# Patient Record
Sex: Male | Born: 1935
Health system: Southern US, Community
[De-identification: ages and names within clinical notes are randomized; demographics above are authoritative.]

## PROBLEM LIST (undated history)

## (undated) DIAGNOSIS — N183 Chronic kidney disease, stage 3 unspecified: Secondary | ICD-10-CM

## (undated) DIAGNOSIS — J449 Chronic obstructive pulmonary disease, unspecified: Secondary | ICD-10-CM

## (undated) DIAGNOSIS — G4733 Obstructive sleep apnea (adult) (pediatric): Secondary | ICD-10-CM

## (undated) DIAGNOSIS — I10 Essential (primary) hypertension: Secondary | ICD-10-CM

## (undated) DIAGNOSIS — J189 Pneumonia, unspecified organism: Secondary | ICD-10-CM

## (undated) DIAGNOSIS — E785 Hyperlipidemia, unspecified: Secondary | ICD-10-CM

## (undated) DIAGNOSIS — I4891 Unspecified atrial fibrillation: Secondary | ICD-10-CM

## (undated) DIAGNOSIS — I4892 Unspecified atrial flutter: Secondary | ICD-10-CM

## (undated) DIAGNOSIS — G9340 Encephalopathy, unspecified: Secondary | ICD-10-CM

## (undated) DIAGNOSIS — A0472 Enterocolitis due to Clostridium difficile, not specified as recurrent: Secondary | ICD-10-CM

## (undated) DIAGNOSIS — A419 Sepsis, unspecified organism: Secondary | ICD-10-CM

## (undated) DIAGNOSIS — R6521 Severe sepsis with septic shock: Secondary | ICD-10-CM

## (undated) HISTORY — DX: Sepsis, unspecified organism: A41.9

## (undated) HISTORY — DX: Hyperlipidemia, unspecified: E78.5

## (undated) HISTORY — DX: Chronic kidney disease, stage 3 (moderate): N18.3

## (undated) HISTORY — DX: Unspecified atrial flutter: I48.92

## (undated) HISTORY — DX: Enterocolitis due to Clostridium difficile, not specified as recurrent: A04.72

## (undated) HISTORY — DX: Encephalopathy, unspecified: G93.40

## (undated) HISTORY — DX: Severe sepsis with septic shock: R65.21

## (undated) HISTORY — DX: Pneumonia, unspecified organism: J18.9

## (undated) HISTORY — DX: Unspecified atrial fibrillation: I48.91

## (undated) HISTORY — PX: EYE SURGERY: SHX253

## (undated) HISTORY — DX: Chronic obstructive pulmonary disease, unspecified: J44.9

## (undated) HISTORY — PX: CATARACT EXTRACTION: SUR2

## (undated) HISTORY — DX: Chronic kidney disease, stage 3 unspecified: N18.30

---

## 1997-10-23 HISTORY — PX: CORONARY ARTERY BYPASS GRAFT: SHX141

## 1997-11-01 ENCOUNTER — Inpatient Hospital Stay (HOSPITAL_COMMUNITY)
Admission: AD | Admit: 1997-11-01 | Discharge: 1997-11-06 | Payer: Self-pay | Admitting: Thoracic Surgery (Cardiothoracic Vascular Surgery)

## 1998-06-06 ENCOUNTER — Observation Stay (HOSPITAL_COMMUNITY): Admission: RE | Admit: 1998-06-06 | Discharge: 1998-06-06 | Payer: Self-pay | Admitting: Neurosurgery

## 1998-06-06 ENCOUNTER — Encounter: Payer: Self-pay | Admitting: Neurosurgery

## 1998-07-26 ENCOUNTER — Encounter: Payer: Self-pay | Admitting: Neurosurgery

## 1998-07-26 ENCOUNTER — Ambulatory Visit (HOSPITAL_COMMUNITY): Admission: RE | Admit: 1998-07-26 | Discharge: 1998-07-26 | Payer: Self-pay | Admitting: Neurosurgery

## 2002-02-22 HISTORY — PX: OTHER SURGICAL HISTORY: SHX169

## 2007-05-26 HISTORY — PX: OTHER SURGICAL HISTORY: SHX169

## 2010-05-20 ENCOUNTER — Ambulatory Visit: Payer: Self-pay | Admitting: Internal Medicine

## 2011-05-26 DIAGNOSIS — I4891 Unspecified atrial fibrillation: Secondary | ICD-10-CM

## 2011-05-26 HISTORY — DX: Unspecified atrial fibrillation: I48.91

## 2011-06-15 DIAGNOSIS — I2510 Atherosclerotic heart disease of native coronary artery without angina pectoris: Secondary | ICD-10-CM | POA: Diagnosis not present

## 2011-06-15 DIAGNOSIS — I4891 Unspecified atrial fibrillation: Secondary | ICD-10-CM | POA: Diagnosis not present

## 2011-06-15 DIAGNOSIS — I119 Hypertensive heart disease without heart failure: Secondary | ICD-10-CM | POA: Diagnosis not present

## 2011-06-15 DIAGNOSIS — E782 Mixed hyperlipidemia: Secondary | ICD-10-CM | POA: Diagnosis not present

## 2011-08-04 DIAGNOSIS — E785 Hyperlipidemia, unspecified: Secondary | ICD-10-CM | POA: Diagnosis not present

## 2011-08-04 DIAGNOSIS — J40 Bronchitis, not specified as acute or chronic: Secondary | ICD-10-CM | POA: Diagnosis not present

## 2011-08-04 DIAGNOSIS — Z23 Encounter for immunization: Secondary | ICD-10-CM | POA: Diagnosis not present

## 2011-08-11 DIAGNOSIS — R5381 Other malaise: Secondary | ICD-10-CM | POA: Diagnosis not present

## 2011-08-11 DIAGNOSIS — E785 Hyperlipidemia, unspecified: Secondary | ICD-10-CM | POA: Diagnosis not present

## 2011-08-11 DIAGNOSIS — Z23 Encounter for immunization: Secondary | ICD-10-CM | POA: Diagnosis not present

## 2011-08-11 DIAGNOSIS — I10 Essential (primary) hypertension: Secondary | ICD-10-CM | POA: Diagnosis not present

## 2011-08-11 DIAGNOSIS — R42 Dizziness and giddiness: Secondary | ICD-10-CM | POA: Diagnosis not present

## 2011-08-18 ENCOUNTER — Ambulatory Visit: Payer: Self-pay | Admitting: Family Medicine

## 2011-08-18 DIAGNOSIS — R413 Other amnesia: Secondary | ICD-10-CM | POA: Diagnosis not present

## 2011-08-18 DIAGNOSIS — E0590 Thyrotoxicosis, unspecified without thyrotoxic crisis or storm: Secondary | ICD-10-CM | POA: Diagnosis not present

## 2011-08-24 DIAGNOSIS — I2510 Atherosclerotic heart disease of native coronary artery without angina pectoris: Secondary | ICD-10-CM | POA: Diagnosis not present

## 2011-08-24 DIAGNOSIS — R42 Dizziness and giddiness: Secondary | ICD-10-CM | POA: Diagnosis not present

## 2011-08-24 DIAGNOSIS — E782 Mixed hyperlipidemia: Secondary | ICD-10-CM | POA: Diagnosis not present

## 2011-08-24 DIAGNOSIS — I4891 Unspecified atrial fibrillation: Secondary | ICD-10-CM | POA: Diagnosis not present

## 2011-08-30 DIAGNOSIS — R4182 Altered mental status, unspecified: Secondary | ICD-10-CM | POA: Diagnosis not present

## 2011-08-30 DIAGNOSIS — J189 Pneumonia, unspecified organism: Secondary | ICD-10-CM | POA: Diagnosis not present

## 2011-08-30 DIAGNOSIS — R404 Transient alteration of awareness: Secondary | ICD-10-CM | POA: Diagnosis not present

## 2011-08-30 DIAGNOSIS — R509 Fever, unspecified: Secondary | ICD-10-CM | POA: Diagnosis not present

## 2011-08-30 DIAGNOSIS — R918 Other nonspecific abnormal finding of lung field: Secondary | ICD-10-CM | POA: Diagnosis not present

## 2011-08-30 DIAGNOSIS — N179 Acute kidney failure, unspecified: Secondary | ICD-10-CM | POA: Diagnosis not present

## 2011-08-30 DIAGNOSIS — R6889 Other general symptoms and signs: Secondary | ICD-10-CM | POA: Diagnosis not present

## 2011-08-30 LAB — CBC
HCT: 36.8 % — ABNORMAL LOW (ref 40.0–52.0)
HGB: 12.3 g/dL — ABNORMAL LOW (ref 13.0–18.0)
MCH: 30.7 pg (ref 26.0–34.0)
MCHC: 33.5 g/dL (ref 32.0–36.0)
MCV: 92 fL (ref 80–100)
Platelet: 171 10*3/uL (ref 150–440)
RBC: 4.02 10*6/uL — ABNORMAL LOW (ref 4.40–5.90)
RDW: 15.9 % — ABNORMAL HIGH (ref 11.5–14.5)
WBC: 9.3 10*3/uL (ref 3.8–10.6)

## 2011-08-30 LAB — COMPREHENSIVE METABOLIC PANEL
Albumin: 3.6 g/dL (ref 3.4–5.0)
Alkaline Phosphatase: 75 U/L (ref 50–136)
Anion Gap: 9 (ref 7–16)
BUN: 26 mg/dL — ABNORMAL HIGH (ref 7–18)
Bilirubin,Total: 0.4 mg/dL (ref 0.2–1.0)
Calcium, Total: 9.1 mg/dL (ref 8.5–10.1)
Chloride: 106 mmol/L (ref 98–107)
Co2: 26 mmol/L (ref 21–32)
Creatinine: 2.08 mg/dL — ABNORMAL HIGH (ref 0.60–1.30)
EGFR (African American): 40 — ABNORMAL LOW
EGFR (Non-African Amer.): 33 — ABNORMAL LOW
Glucose: 125 mg/dL — ABNORMAL HIGH (ref 65–99)
Osmolality: 287 (ref 275–301)
Potassium: 4.4 mmol/L (ref 3.5–5.1)
SGOT(AST): 37 U/L (ref 15–37)
SGPT (ALT): 32 U/L
Sodium: 141 mmol/L (ref 136–145)
Total Protein: 7.7 g/dL (ref 6.4–8.2)

## 2011-08-30 LAB — DRUG SCREEN, URINE
Amphetamines, Ur Screen: NEGATIVE (ref ?–1000)
Barbiturates, Ur Screen: NEGATIVE (ref ?–200)
Benzodiazepine, Ur Scrn: POSITIVE (ref ?–200)
Cannabinoid 50 Ng, Ur ~~LOC~~: NEGATIVE (ref ?–50)
Cocaine Metabolite,Ur ~~LOC~~: NEGATIVE (ref ?–300)
MDMA (Ecstasy)Ur Screen: NEGATIVE (ref ?–500)
Methadone, Ur Screen: NEGATIVE (ref ?–300)
Opiate, Ur Screen: POSITIVE (ref ?–300)
Phencyclidine (PCP) Ur S: NEGATIVE (ref ?–25)
Tricyclic, Ur Screen: NEGATIVE (ref ?–1000)

## 2011-08-30 LAB — TROPONIN I: Troponin-I: <0.02 ng/mL

## 2011-08-30 LAB — PROTIME-INR
INR: 3.9
Prothrombin Time: 38 secs — ABNORMAL HIGH (ref 11.5–14.7)

## 2011-08-31 ENCOUNTER — Inpatient Hospital Stay: Payer: Self-pay | Admitting: Internal Medicine

## 2011-08-31 DIAGNOSIS — E785 Hyperlipidemia, unspecified: Secondary | ICD-10-CM | POA: Diagnosis present

## 2011-08-31 DIAGNOSIS — J189 Pneumonia, unspecified organism: Secondary | ICD-10-CM | POA: Diagnosis present

## 2011-08-31 DIAGNOSIS — R55 Syncope and collapse: Secondary | ICD-10-CM | POA: Diagnosis not present

## 2011-08-31 DIAGNOSIS — N179 Acute kidney failure, unspecified: Secondary | ICD-10-CM | POA: Diagnosis not present

## 2011-08-31 DIAGNOSIS — Z91013 Allergy to seafood: Secondary | ICD-10-CM | POA: Diagnosis not present

## 2011-08-31 DIAGNOSIS — E860 Dehydration: Secondary | ICD-10-CM | POA: Diagnosis not present

## 2011-08-31 DIAGNOSIS — I495 Sick sinus syndrome: Secondary | ICD-10-CM | POA: Diagnosis present

## 2011-08-31 DIAGNOSIS — I2510 Atherosclerotic heart disease of native coronary artery without angina pectoris: Secondary | ICD-10-CM | POA: Diagnosis not present

## 2011-08-31 DIAGNOSIS — Z7901 Long term (current) use of anticoagulants: Secondary | ICD-10-CM | POA: Diagnosis not present

## 2011-08-31 DIAGNOSIS — I6529 Occlusion and stenosis of unspecified carotid artery: Secondary | ICD-10-CM | POA: Diagnosis not present

## 2011-08-31 DIAGNOSIS — I10 Essential (primary) hypertension: Secondary | ICD-10-CM | POA: Diagnosis not present

## 2011-08-31 DIAGNOSIS — R5381 Other malaise: Secondary | ICD-10-CM | POA: Diagnosis not present

## 2011-08-31 DIAGNOSIS — G2581 Restless legs syndrome: Secondary | ICD-10-CM | POA: Diagnosis present

## 2011-08-31 DIAGNOSIS — R42 Dizziness and giddiness: Secondary | ICD-10-CM | POA: Diagnosis not present

## 2011-08-31 DIAGNOSIS — I658 Occlusion and stenosis of other precerebral arteries: Secondary | ICD-10-CM | POA: Diagnosis not present

## 2011-08-31 DIAGNOSIS — Z87891 Personal history of nicotine dependence: Secondary | ICD-10-CM | POA: Diagnosis not present

## 2011-08-31 DIAGNOSIS — Z951 Presence of aortocoronary bypass graft: Secondary | ICD-10-CM | POA: Diagnosis not present

## 2011-08-31 DIAGNOSIS — Z7982 Long term (current) use of aspirin: Secondary | ICD-10-CM | POA: Diagnosis not present

## 2011-08-31 DIAGNOSIS — I4891 Unspecified atrial fibrillation: Secondary | ICD-10-CM | POA: Diagnosis not present

## 2011-08-31 DIAGNOSIS — Z7902 Long term (current) use of antithrombotics/antiplatelets: Secondary | ICD-10-CM | POA: Diagnosis not present

## 2011-08-31 LAB — BASIC METABOLIC PANEL
Anion Gap: 11 (ref 7–16)
BUN: 27 mg/dL — ABNORMAL HIGH (ref 7–18)
Calcium, Total: 8.5 mg/dL (ref 8.5–10.1)
Chloride: 105 mmol/L (ref 98–107)
Co2: 24 mmol/L (ref 21–32)
Creatinine: 1.85 mg/dL — ABNORMAL HIGH (ref 0.60–1.30)
EGFR (African American): 46 — ABNORMAL LOW
EGFR (Non-African Amer.): 38 — ABNORMAL LOW
Glucose: 117 mg/dL — ABNORMAL HIGH (ref 65–99)
Osmolality: 286 (ref 275–301)
Potassium: 4.6 mmol/L (ref 3.5–5.1)
Sodium: 140 mmol/L (ref 136–145)

## 2011-08-31 LAB — URINALYSIS, COMPLETE
Bacteria: NONE SEEN
Bilirubin,UR: NEGATIVE
Glucose,UR: NEGATIVE mg/dL (ref 0–75)
Hyaline Cast: 3
Ketone: NEGATIVE
Nitrite: NEGATIVE
Ph: 5 (ref 4.5–8.0)
Protein: NEGATIVE
RBC,UR: 17 /HPF (ref 0–5)
Specific Gravity: 1.018 (ref 1.003–1.030)
Squamous Epithelial: NONE SEEN
WBC UR: 2 /HPF (ref 0–5)

## 2011-08-31 LAB — CREATININE, URINE, RANDOM: Creatinine, Urine Random: 173.5 mg/dL — ABNORMAL HIGH (ref 30.0–125.0)

## 2011-08-31 LAB — SODIUM, URINE, RANDOM: Sodium, Urine Random: 98 mmol/L (ref 20–110)

## 2011-08-31 LAB — DIGOXIN LEVEL: Digoxin: 0.65 ng/mL

## 2011-09-01 DIAGNOSIS — E860 Dehydration: Secondary | ICD-10-CM | POA: Diagnosis not present

## 2011-09-01 DIAGNOSIS — I10 Essential (primary) hypertension: Secondary | ICD-10-CM | POA: Diagnosis not present

## 2011-09-01 DIAGNOSIS — N179 Acute kidney failure, unspecified: Secondary | ICD-10-CM | POA: Diagnosis not present

## 2011-09-01 DIAGNOSIS — I4891 Unspecified atrial fibrillation: Secondary | ICD-10-CM | POA: Diagnosis not present

## 2011-09-01 DIAGNOSIS — J189 Pneumonia, unspecified organism: Secondary | ICD-10-CM | POA: Diagnosis not present

## 2011-09-01 DIAGNOSIS — R55 Syncope and collapse: Secondary | ICD-10-CM | POA: Diagnosis not present

## 2011-09-01 LAB — PROTIME-INR
INR: 2.7
Prothrombin Time: 28.8 secs — ABNORMAL HIGH (ref 11.5–14.7)

## 2011-09-01 LAB — TSH: Thyroid Stimulating Horm: 5.43 u[IU]/mL — ABNORMAL HIGH

## 2011-09-01 LAB — MAGNESIUM: Magnesium: 1.6 mg/dL — ABNORMAL LOW

## 2011-09-01 LAB — CBC WITH DIFFERENTIAL/PLATELET
Basophil #: 0 10*3/uL (ref 0.0–0.1)
Basophil %: 0.6 %
Eosinophil #: 0.2 10*3/uL (ref 0.0–0.7)
Eosinophil %: 4 %
HCT: 35.1 % — ABNORMAL LOW (ref 40.0–52.0)
HGB: 11.5 g/dL — ABNORMAL LOW (ref 13.0–18.0)
Lymphocyte #: 1.8 10*3/uL (ref 1.0–3.6)
Lymphocyte %: 31.3 %
MCH: 30.7 pg (ref 26.0–34.0)
MCHC: 32.8 g/dL (ref 32.0–36.0)
MCV: 94 fL (ref 80–100)
Monocyte #: 0.5 10*3/uL (ref 0.0–0.7)
Monocyte %: 8.5 %
Neutrophil #: 3.2 10*3/uL (ref 1.4–6.5)
Neutrophil %: 55.6 %
Platelet: 126 10*3/uL — ABNORMAL LOW (ref 150–440)
RBC: 3.76 10*6/uL — ABNORMAL LOW (ref 4.40–5.90)
RDW: 16.3 % — ABNORMAL HIGH (ref 11.5–14.5)
WBC: 5.7 10*3/uL (ref 3.8–10.6)

## 2011-09-01 LAB — LIPID PANEL
Cholesterol: 104 mg/dL (ref 0–200)
HDL Cholesterol: 42 mg/dL (ref 40–60)
Ldl Cholesterol, Calc: 44 mg/dL (ref 0–100)
Triglycerides: 88 mg/dL (ref 0–200)
VLDL Cholesterol, Calc: 18 mg/dL (ref 5–40)

## 2011-09-01 LAB — BASIC METABOLIC PANEL
Anion Gap: 8 (ref 7–16)
BUN: 18 mg/dL (ref 7–18)
Calcium, Total: 8.6 mg/dL (ref 8.5–10.1)
Chloride: 109 mmol/L — ABNORMAL HIGH (ref 98–107)
Co2: 24 mmol/L (ref 21–32)
Creatinine: 1.17 mg/dL (ref 0.60–1.30)
EGFR (African American): >60
EGFR (Non-African Amer.): >60
Glucose: 81 mg/dL (ref 65–99)
Osmolality: 282 (ref 275–301)
Potassium: 5 mmol/L (ref 3.5–5.1)
Sodium: 141 mmol/L (ref 136–145)

## 2011-09-01 LAB — FOLATE: Folic Acid: 11.5 ng/mL (ref 3.1–100.0)

## 2011-09-02 LAB — HEMOGLOBIN A1C

## 2011-09-03 DIAGNOSIS — I2510 Atherosclerotic heart disease of native coronary artery without angina pectoris: Secondary | ICD-10-CM | POA: Diagnosis not present

## 2011-09-03 DIAGNOSIS — I4891 Unspecified atrial fibrillation: Secondary | ICD-10-CM | POA: Diagnosis not present

## 2011-09-03 DIAGNOSIS — I10 Essential (primary) hypertension: Secondary | ICD-10-CM | POA: Diagnosis not present

## 2011-09-03 DIAGNOSIS — E785 Hyperlipidemia, unspecified: Secondary | ICD-10-CM | POA: Diagnosis not present

## 2011-09-07 LAB — CULTURE, BLOOD (SINGLE)

## 2011-09-15 ENCOUNTER — Ambulatory Visit: Payer: Self-pay | Admitting: Family Medicine

## 2011-09-15 DIAGNOSIS — J189 Pneumonia, unspecified organism: Secondary | ICD-10-CM | POA: Diagnosis not present

## 2011-09-15 DIAGNOSIS — R9389 Abnormal findings on diagnostic imaging of other specified body structures: Secondary | ICD-10-CM | POA: Diagnosis not present

## 2011-09-15 DIAGNOSIS — J449 Chronic obstructive pulmonary disease, unspecified: Secondary | ICD-10-CM | POA: Diagnosis not present

## 2011-09-15 DIAGNOSIS — Z951 Presence of aortocoronary bypass graft: Secondary | ICD-10-CM | POA: Diagnosis not present

## 2011-09-17 DIAGNOSIS — I4891 Unspecified atrial fibrillation: Secondary | ICD-10-CM | POA: Diagnosis not present

## 2011-09-18 DIAGNOSIS — G8929 Other chronic pain: Secondary | ICD-10-CM | POA: Diagnosis not present

## 2011-09-18 DIAGNOSIS — J189 Pneumonia, unspecified organism: Secondary | ICD-10-CM | POA: Diagnosis not present

## 2011-09-18 DIAGNOSIS — Z23 Encounter for immunization: Secondary | ICD-10-CM | POA: Diagnosis not present

## 2011-09-18 DIAGNOSIS — E785 Hyperlipidemia, unspecified: Secondary | ICD-10-CM | POA: Diagnosis not present

## 2011-09-23 DIAGNOSIS — J209 Acute bronchitis, unspecified: Secondary | ICD-10-CM | POA: Diagnosis not present

## 2011-09-23 DIAGNOSIS — Z23 Encounter for immunization: Secondary | ICD-10-CM | POA: Diagnosis not present

## 2011-09-23 DIAGNOSIS — J189 Pneumonia, unspecified organism: Secondary | ICD-10-CM | POA: Diagnosis not present

## 2011-10-05 DIAGNOSIS — R05 Cough: Secondary | ICD-10-CM | POA: Diagnosis not present

## 2011-10-05 DIAGNOSIS — J189 Pneumonia, unspecified organism: Secondary | ICD-10-CM | POA: Diagnosis not present

## 2011-10-05 DIAGNOSIS — R059 Cough, unspecified: Secondary | ICD-10-CM | POA: Diagnosis not present

## 2011-10-05 DIAGNOSIS — Z23 Encounter for immunization: Secondary | ICD-10-CM | POA: Diagnosis not present

## 2011-10-09 DIAGNOSIS — Z23 Encounter for immunization: Secondary | ICD-10-CM | POA: Diagnosis not present

## 2011-10-09 DIAGNOSIS — J189 Pneumonia, unspecified organism: Secondary | ICD-10-CM | POA: Diagnosis not present

## 2011-10-09 DIAGNOSIS — R040 Epistaxis: Secondary | ICD-10-CM | POA: Diagnosis not present

## 2011-10-10 ENCOUNTER — Emergency Department: Payer: Self-pay | Admitting: Emergency Medicine

## 2011-10-10 DIAGNOSIS — I4891 Unspecified atrial fibrillation: Secondary | ICD-10-CM | POA: Diagnosis not present

## 2011-10-10 DIAGNOSIS — Z79899 Other long term (current) drug therapy: Secondary | ICD-10-CM | POA: Diagnosis not present

## 2011-10-10 DIAGNOSIS — Z951 Presence of aortocoronary bypass graft: Secondary | ICD-10-CM | POA: Diagnosis not present

## 2011-10-10 DIAGNOSIS — Z7901 Long term (current) use of anticoagulants: Secondary | ICD-10-CM | POA: Diagnosis not present

## 2011-10-10 DIAGNOSIS — I2510 Atherosclerotic heart disease of native coronary artery without angina pectoris: Secondary | ICD-10-CM | POA: Diagnosis not present

## 2011-10-10 DIAGNOSIS — R040 Epistaxis: Secondary | ICD-10-CM | POA: Diagnosis not present

## 2011-10-10 LAB — CBC WITH DIFFERENTIAL/PLATELET
Basophil #: 0 10*3/uL (ref 0.0–0.1)
Basophil %: 0.7 %
Eosinophil #: 0.2 10*3/uL (ref 0.0–0.7)
Eosinophil %: 3.1 %
HCT: 35.3 % — ABNORMAL LOW (ref 40.0–52.0)
HGB: 11.6 g/dL — ABNORMAL LOW (ref 13.0–18.0)
Lymphocyte #: 1.6 10*3/uL (ref 1.0–3.6)
Lymphocyte %: 28.1 %
MCH: 30.8 pg (ref 26.0–34.0)
MCHC: 32.9 g/dL (ref 32.0–36.0)
MCV: 94 fL (ref 80–100)
Monocyte #: 0.6 x10 3/mm (ref 0.2–1.0)
Monocyte %: 9.9 %
Neutrophil #: 3.4 10*3/uL (ref 1.4–6.5)
Neutrophil %: 58.2 %
Platelet: 172 10*3/uL (ref 150–440)
RBC: 3.76 10*6/uL — ABNORMAL LOW (ref 4.40–5.90)
RDW: 16.7 % — ABNORMAL HIGH (ref 11.5–14.5)
WBC: 5.9 10*3/uL (ref 3.8–10.6)

## 2011-10-10 LAB — APTT: Activated PTT: 34.9 secs (ref 23.6–35.9)

## 2011-10-10 LAB — PROTIME-INR
INR: 1.9
Prothrombin Time: 21.9 secs — ABNORMAL HIGH (ref 11.5–14.7)

## 2011-10-11 ENCOUNTER — Emergency Department: Payer: Self-pay | Admitting: Emergency Medicine

## 2011-10-11 DIAGNOSIS — I4891 Unspecified atrial fibrillation: Secondary | ICD-10-CM | POA: Diagnosis not present

## 2011-10-11 DIAGNOSIS — Z951 Presence of aortocoronary bypass graft: Secondary | ICD-10-CM | POA: Diagnosis not present

## 2011-10-11 DIAGNOSIS — I2510 Atherosclerotic heart disease of native coronary artery without angina pectoris: Secondary | ICD-10-CM | POA: Diagnosis not present

## 2011-10-11 DIAGNOSIS — R040 Epistaxis: Secondary | ICD-10-CM | POA: Diagnosis not present

## 2011-10-12 DIAGNOSIS — R040 Epistaxis: Secondary | ICD-10-CM | POA: Diagnosis not present

## 2011-10-14 DIAGNOSIS — R040 Epistaxis: Secondary | ICD-10-CM | POA: Diagnosis not present

## 2011-10-15 DIAGNOSIS — R040 Epistaxis: Secondary | ICD-10-CM | POA: Diagnosis not present

## 2011-10-26 ENCOUNTER — Ambulatory Visit: Payer: Self-pay | Admitting: Family Medicine

## 2011-10-26 DIAGNOSIS — R05 Cough: Secondary | ICD-10-CM | POA: Diagnosis not present

## 2011-10-26 DIAGNOSIS — R059 Cough, unspecified: Secondary | ICD-10-CM | POA: Diagnosis not present

## 2011-10-26 DIAGNOSIS — J449 Chronic obstructive pulmonary disease, unspecified: Secondary | ICD-10-CM | POA: Diagnosis not present

## 2011-10-28 DIAGNOSIS — R059 Cough, unspecified: Secondary | ICD-10-CM | POA: Diagnosis not present

## 2011-10-28 DIAGNOSIS — R05 Cough: Secondary | ICD-10-CM | POA: Diagnosis not present

## 2011-10-28 DIAGNOSIS — Z23 Encounter for immunization: Secondary | ICD-10-CM | POA: Diagnosis not present

## 2011-10-28 DIAGNOSIS — J189 Pneumonia, unspecified organism: Secondary | ICD-10-CM | POA: Diagnosis not present

## 2011-12-27 ENCOUNTER — Emergency Department: Payer: Self-pay | Admitting: Emergency Medicine

## 2011-12-27 DIAGNOSIS — I10 Essential (primary) hypertension: Secondary | ICD-10-CM | POA: Diagnosis not present

## 2011-12-27 DIAGNOSIS — Z951 Presence of aortocoronary bypass graft: Secondary | ICD-10-CM | POA: Diagnosis not present

## 2011-12-27 DIAGNOSIS — Z7901 Long term (current) use of anticoagulants: Secondary | ICD-10-CM | POA: Diagnosis not present

## 2011-12-27 DIAGNOSIS — R040 Epistaxis: Secondary | ICD-10-CM | POA: Diagnosis not present

## 2011-12-27 LAB — CBC
HCT: 34.9 % — ABNORMAL LOW (ref 40.0–52.0)
HGB: 11.7 g/dL — ABNORMAL LOW (ref 13.0–18.0)
MCH: 31.1 pg (ref 26.0–34.0)
MCHC: 33.4 g/dL (ref 32.0–36.0)
MCV: 93 fL (ref 80–100)
Platelet: 134 10*3/uL — ABNORMAL LOW (ref 150–440)
RBC: 3.75 10*6/uL — ABNORMAL LOW (ref 4.40–5.90)
RDW: 15.9 % — ABNORMAL HIGH (ref 11.5–14.5)
WBC: 7 10*3/uL (ref 3.8–10.6)

## 2011-12-27 LAB — PROTIME-INR
INR: 2.5
Prothrombin Time: 27.4 secs — ABNORMAL HIGH (ref 11.5–14.7)

## 2011-12-29 ENCOUNTER — Emergency Department: Payer: Self-pay | Admitting: *Deleted

## 2011-12-29 DIAGNOSIS — I10 Essential (primary) hypertension: Secondary | ICD-10-CM | POA: Diagnosis not present

## 2011-12-29 DIAGNOSIS — Z79899 Other long term (current) drug therapy: Secondary | ICD-10-CM | POA: Diagnosis not present

## 2011-12-29 DIAGNOSIS — I252 Old myocardial infarction: Secondary | ICD-10-CM | POA: Diagnosis not present

## 2011-12-29 DIAGNOSIS — Z951 Presence of aortocoronary bypass graft: Secondary | ICD-10-CM | POA: Diagnosis not present

## 2011-12-29 DIAGNOSIS — R040 Epistaxis: Secondary | ICD-10-CM | POA: Diagnosis not present

## 2011-12-29 DIAGNOSIS — Z7901 Long term (current) use of anticoagulants: Secondary | ICD-10-CM | POA: Diagnosis not present

## 2011-12-30 ENCOUNTER — Emergency Department: Payer: Self-pay | Admitting: Emergency Medicine

## 2011-12-30 DIAGNOSIS — K2990 Gastroduodenitis, unspecified, without bleeding: Secondary | ICD-10-CM | POA: Diagnosis not present

## 2011-12-30 DIAGNOSIS — R040 Epistaxis: Secondary | ICD-10-CM | POA: Diagnosis not present

## 2011-12-30 DIAGNOSIS — R0609 Other forms of dyspnea: Secondary | ICD-10-CM | POA: Diagnosis not present

## 2011-12-30 DIAGNOSIS — Z23 Encounter for immunization: Secondary | ICD-10-CM | POA: Diagnosis not present

## 2011-12-30 DIAGNOSIS — K2970 Gastritis, unspecified, without bleeding: Secondary | ICD-10-CM | POA: Diagnosis not present

## 2011-12-30 DIAGNOSIS — I10 Essential (primary) hypertension: Secondary | ICD-10-CM | POA: Diagnosis not present

## 2011-12-30 DIAGNOSIS — R0989 Other specified symptoms and signs involving the circulatory and respiratory systems: Secondary | ICD-10-CM | POA: Diagnosis not present

## 2011-12-30 LAB — CBC
HCT: 33.1 % — ABNORMAL LOW (ref 40.0–52.0)
HGB: 11.4 g/dL — ABNORMAL LOW (ref 13.0–18.0)
MCH: 31.9 pg (ref 26.0–34.0)
MCHC: 34.5 g/dL (ref 32.0–36.0)
MCV: 93 fL (ref 80–100)
Platelet: 137 10*3/uL — ABNORMAL LOW (ref 150–440)
RBC: 3.58 10*6/uL — ABNORMAL LOW (ref 4.40–5.90)
RDW: 15.9 % — ABNORMAL HIGH (ref 11.5–14.5)
WBC: 8.8 10*3/uL (ref 3.8–10.6)

## 2011-12-30 LAB — APTT: Activated PTT: 37.6 secs — ABNORMAL HIGH (ref 23.6–35.9)

## 2011-12-30 LAB — PROTIME-INR
INR: 1.8
Prothrombin Time: 21.4 secs — ABNORMAL HIGH (ref 11.5–14.7)

## 2012-01-04 DIAGNOSIS — R040 Epistaxis: Secondary | ICD-10-CM | POA: Diagnosis not present

## 2012-01-20 DIAGNOSIS — I10 Essential (primary) hypertension: Secondary | ICD-10-CM | POA: Diagnosis not present

## 2012-01-20 DIAGNOSIS — M5020 Other cervical disc displacement, unspecified cervical region: Secondary | ICD-10-CM | POA: Diagnosis not present

## 2012-01-20 DIAGNOSIS — G2581 Restless legs syndrome: Secondary | ICD-10-CM | POA: Diagnosis not present

## 2012-01-20 DIAGNOSIS — J449 Chronic obstructive pulmonary disease, unspecified: Secondary | ICD-10-CM | POA: Diagnosis not present

## 2012-01-28 DIAGNOSIS — R609 Edema, unspecified: Secondary | ICD-10-CM | POA: Diagnosis not present

## 2012-01-28 DIAGNOSIS — M5020 Other cervical disc displacement, unspecified cervical region: Secondary | ICD-10-CM | POA: Diagnosis not present

## 2012-01-28 DIAGNOSIS — I10 Essential (primary) hypertension: Secondary | ICD-10-CM | POA: Diagnosis not present

## 2012-01-28 DIAGNOSIS — J449 Chronic obstructive pulmonary disease, unspecified: Secondary | ICD-10-CM | POA: Diagnosis not present

## 2012-02-02 DIAGNOSIS — I059 Rheumatic mitral valve disease, unspecified: Secondary | ICD-10-CM | POA: Diagnosis not present

## 2012-02-02 DIAGNOSIS — I509 Heart failure, unspecified: Secondary | ICD-10-CM | POA: Diagnosis not present

## 2012-02-02 DIAGNOSIS — I4891 Unspecified atrial fibrillation: Secondary | ICD-10-CM | POA: Diagnosis not present

## 2012-02-02 DIAGNOSIS — I2789 Other specified pulmonary heart diseases: Secondary | ICD-10-CM | POA: Diagnosis not present

## 2012-02-17 DIAGNOSIS — I209 Angina pectoris, unspecified: Secondary | ICD-10-CM | POA: Diagnosis not present

## 2012-02-17 DIAGNOSIS — I509 Heart failure, unspecified: Secondary | ICD-10-CM | POA: Diagnosis not present

## 2012-02-24 DIAGNOSIS — I119 Hypertensive heart disease without heart failure: Secondary | ICD-10-CM | POA: Diagnosis not present

## 2012-02-24 DIAGNOSIS — I4891 Unspecified atrial fibrillation: Secondary | ICD-10-CM | POA: Diagnosis not present

## 2012-02-24 DIAGNOSIS — I2789 Other specified pulmonary heart diseases: Secondary | ICD-10-CM | POA: Diagnosis not present

## 2012-02-24 DIAGNOSIS — I369 Nonrheumatic tricuspid valve disorder, unspecified: Secondary | ICD-10-CM | POA: Diagnosis not present

## 2012-03-01 DIAGNOSIS — R0602 Shortness of breath: Secondary | ICD-10-CM | POA: Diagnosis not present

## 2012-03-16 DIAGNOSIS — G4730 Sleep apnea, unspecified: Secondary | ICD-10-CM | POA: Diagnosis not present

## 2012-03-16 DIAGNOSIS — I4891 Unspecified atrial fibrillation: Secondary | ICD-10-CM | POA: Diagnosis not present

## 2012-03-16 DIAGNOSIS — I5022 Chronic systolic (congestive) heart failure: Secondary | ICD-10-CM | POA: Diagnosis not present

## 2012-03-16 DIAGNOSIS — I2510 Atherosclerotic heart disease of native coronary artery without angina pectoris: Secondary | ICD-10-CM | POA: Diagnosis not present

## 2012-03-23 ENCOUNTER — Ambulatory Visit: Payer: Self-pay | Admitting: Internal Medicine

## 2012-03-23 DIAGNOSIS — I4891 Unspecified atrial fibrillation: Secondary | ICD-10-CM | POA: Diagnosis not present

## 2012-03-23 DIAGNOSIS — G4730 Sleep apnea, unspecified: Secondary | ICD-10-CM | POA: Diagnosis not present

## 2012-03-23 DIAGNOSIS — G4710 Hypersomnia, unspecified: Secondary | ICD-10-CM | POA: Diagnosis not present

## 2012-03-23 DIAGNOSIS — G4733 Obstructive sleep apnea (adult) (pediatric): Secondary | ICD-10-CM | POA: Diagnosis not present

## 2012-03-23 DIAGNOSIS — G4737 Central sleep apnea in conditions classified elsewhere: Secondary | ICD-10-CM | POA: Diagnosis not present

## 2012-03-23 DIAGNOSIS — I10 Essential (primary) hypertension: Secondary | ICD-10-CM | POA: Diagnosis not present

## 2012-04-12 ENCOUNTER — Ambulatory Visit: Payer: Self-pay | Admitting: Internal Medicine

## 2012-04-12 DIAGNOSIS — I4891 Unspecified atrial fibrillation: Secondary | ICD-10-CM | POA: Diagnosis not present

## 2012-04-12 DIAGNOSIS — G4710 Hypersomnia, unspecified: Secondary | ICD-10-CM | POA: Diagnosis not present

## 2012-04-12 DIAGNOSIS — G4733 Obstructive sleep apnea (adult) (pediatric): Secondary | ICD-10-CM | POA: Diagnosis not present

## 2012-04-12 DIAGNOSIS — G4730 Sleep apnea, unspecified: Secondary | ICD-10-CM | POA: Diagnosis not present

## 2012-04-12 DIAGNOSIS — G478 Other sleep disorders: Secondary | ICD-10-CM | POA: Diagnosis not present

## 2012-04-12 DIAGNOSIS — I10 Essential (primary) hypertension: Secondary | ICD-10-CM | POA: Diagnosis not present

## 2012-04-19 DIAGNOSIS — M5020 Other cervical disc displacement, unspecified cervical region: Secondary | ICD-10-CM | POA: Diagnosis not present

## 2012-04-19 DIAGNOSIS — E785 Hyperlipidemia, unspecified: Secondary | ICD-10-CM | POA: Diagnosis not present

## 2012-04-19 DIAGNOSIS — R609 Edema, unspecified: Secondary | ICD-10-CM | POA: Diagnosis not present

## 2012-04-19 DIAGNOSIS — I10 Essential (primary) hypertension: Secondary | ICD-10-CM | POA: Diagnosis not present

## 2012-04-19 DIAGNOSIS — R252 Cramp and spasm: Secondary | ICD-10-CM | POA: Diagnosis not present

## 2012-04-19 DIAGNOSIS — M79609 Pain in unspecified limb: Secondary | ICD-10-CM | POA: Diagnosis not present

## 2012-04-19 DIAGNOSIS — I2510 Atherosclerotic heart disease of native coronary artery without angina pectoris: Secondary | ICD-10-CM | POA: Diagnosis not present

## 2012-04-26 DIAGNOSIS — H25049 Posterior subcapsular polar age-related cataract, unspecified eye: Secondary | ICD-10-CM | POA: Diagnosis not present

## 2012-08-03 DIAGNOSIS — Z1211 Encounter for screening for malignant neoplasm of colon: Secondary | ICD-10-CM | POA: Diagnosis not present

## 2012-08-03 DIAGNOSIS — Z7901 Long term (current) use of anticoagulants: Secondary | ICD-10-CM | POA: Diagnosis not present

## 2012-08-05 DIAGNOSIS — D649 Anemia, unspecified: Secondary | ICD-10-CM | POA: Diagnosis not present

## 2012-08-15 ENCOUNTER — Ambulatory Visit: Payer: Self-pay | Admitting: Unknown Physician Specialty

## 2012-08-15 DIAGNOSIS — I10 Essential (primary) hypertension: Secondary | ICD-10-CM | POA: Diagnosis not present

## 2012-08-15 DIAGNOSIS — N289 Disorder of kidney and ureter, unspecified: Secondary | ICD-10-CM | POA: Diagnosis not present

## 2012-08-15 DIAGNOSIS — Z86010 Personal history of colonic polyps: Secondary | ICD-10-CM | POA: Diagnosis not present

## 2012-08-15 DIAGNOSIS — I2510 Atherosclerotic heart disease of native coronary artery without angina pectoris: Secondary | ICD-10-CM | POA: Diagnosis not present

## 2012-08-15 DIAGNOSIS — Z79899 Other long term (current) drug therapy: Secondary | ICD-10-CM | POA: Diagnosis not present

## 2012-08-15 DIAGNOSIS — K648 Other hemorrhoids: Secondary | ICD-10-CM | POA: Diagnosis not present

## 2012-08-15 DIAGNOSIS — Z1211 Encounter for screening for malignant neoplasm of colon: Secondary | ICD-10-CM | POA: Diagnosis not present

## 2012-08-15 DIAGNOSIS — G4730 Sleep apnea, unspecified: Secondary | ICD-10-CM | POA: Diagnosis not present

## 2012-08-15 DIAGNOSIS — M519 Unspecified thoracic, thoracolumbar and lumbosacral intervertebral disc disorder: Secondary | ICD-10-CM | POA: Diagnosis not present

## 2012-08-15 DIAGNOSIS — E785 Hyperlipidemia, unspecified: Secondary | ICD-10-CM | POA: Diagnosis not present

## 2012-08-15 DIAGNOSIS — Z7901 Long term (current) use of anticoagulants: Secondary | ICD-10-CM | POA: Diagnosis not present

## 2012-08-15 DIAGNOSIS — Z951 Presence of aortocoronary bypass graft: Secondary | ICD-10-CM | POA: Diagnosis not present

## 2012-08-15 DIAGNOSIS — I4891 Unspecified atrial fibrillation: Secondary | ICD-10-CM | POA: Diagnosis not present

## 2012-08-15 LAB — HM COLONOSCOPY

## 2012-08-17 DIAGNOSIS — R609 Edema, unspecified: Secondary | ICD-10-CM | POA: Diagnosis not present

## 2012-08-17 DIAGNOSIS — M5020 Other cervical disc displacement, unspecified cervical region: Secondary | ICD-10-CM | POA: Diagnosis not present

## 2012-08-17 DIAGNOSIS — R252 Cramp and spasm: Secondary | ICD-10-CM | POA: Diagnosis not present

## 2012-08-17 DIAGNOSIS — J111 Influenza due to unidentified influenza virus with other respiratory manifestations: Secondary | ICD-10-CM | POA: Diagnosis not present

## 2012-08-24 DIAGNOSIS — R252 Cramp and spasm: Secondary | ICD-10-CM | POA: Diagnosis not present

## 2012-08-24 DIAGNOSIS — M5020 Other cervical disc displacement, unspecified cervical region: Secondary | ICD-10-CM | POA: Diagnosis not present

## 2012-08-24 DIAGNOSIS — R609 Edema, unspecified: Secondary | ICD-10-CM | POA: Diagnosis not present

## 2012-08-24 DIAGNOSIS — J209 Acute bronchitis, unspecified: Secondary | ICD-10-CM | POA: Diagnosis not present

## 2012-09-23 DIAGNOSIS — D649 Anemia, unspecified: Secondary | ICD-10-CM | POA: Diagnosis not present

## 2012-10-03 DIAGNOSIS — R252 Cramp and spasm: Secondary | ICD-10-CM | POA: Diagnosis not present

## 2012-10-03 DIAGNOSIS — I10 Essential (primary) hypertension: Secondary | ICD-10-CM | POA: Diagnosis not present

## 2012-10-03 DIAGNOSIS — M5020 Other cervical disc displacement, unspecified cervical region: Secondary | ICD-10-CM | POA: Diagnosis not present

## 2012-10-03 DIAGNOSIS — E785 Hyperlipidemia, unspecified: Secondary | ICD-10-CM | POA: Diagnosis not present

## 2012-10-03 DIAGNOSIS — I4891 Unspecified atrial fibrillation: Secondary | ICD-10-CM | POA: Diagnosis not present

## 2012-10-06 DIAGNOSIS — I509 Heart failure, unspecified: Secondary | ICD-10-CM | POA: Diagnosis not present

## 2012-10-06 DIAGNOSIS — I4891 Unspecified atrial fibrillation: Secondary | ICD-10-CM | POA: Diagnosis not present

## 2012-10-06 DIAGNOSIS — I2510 Atherosclerotic heart disease of native coronary artery without angina pectoris: Secondary | ICD-10-CM | POA: Diagnosis not present

## 2012-10-06 DIAGNOSIS — I10 Essential (primary) hypertension: Secondary | ICD-10-CM | POA: Diagnosis not present

## 2012-12-06 DIAGNOSIS — I4891 Unspecified atrial fibrillation: Secondary | ICD-10-CM | POA: Diagnosis not present

## 2012-12-06 DIAGNOSIS — I2510 Atherosclerotic heart disease of native coronary artery without angina pectoris: Secondary | ICD-10-CM | POA: Diagnosis not present

## 2012-12-06 DIAGNOSIS — E785 Hyperlipidemia, unspecified: Secondary | ICD-10-CM | POA: Diagnosis not present

## 2012-12-06 DIAGNOSIS — I10 Essential (primary) hypertension: Secondary | ICD-10-CM | POA: Diagnosis not present

## 2012-12-30 DIAGNOSIS — I2510 Atherosclerotic heart disease of native coronary artery without angina pectoris: Secondary | ICD-10-CM | POA: Diagnosis not present

## 2012-12-30 DIAGNOSIS — E785 Hyperlipidemia, unspecified: Secondary | ICD-10-CM | POA: Diagnosis not present

## 2012-12-30 DIAGNOSIS — M549 Dorsalgia, unspecified: Secondary | ICD-10-CM | POA: Diagnosis not present

## 2012-12-30 DIAGNOSIS — G2581 Restless legs syndrome: Secondary | ICD-10-CM | POA: Diagnosis not present

## 2013-01-03 DIAGNOSIS — E785 Hyperlipidemia, unspecified: Secondary | ICD-10-CM | POA: Diagnosis not present

## 2013-01-03 DIAGNOSIS — G2581 Restless legs syndrome: Secondary | ICD-10-CM | POA: Diagnosis not present

## 2013-02-17 DIAGNOSIS — I4891 Unspecified atrial fibrillation: Secondary | ICD-10-CM | POA: Diagnosis not present

## 2013-03-17 DIAGNOSIS — I4891 Unspecified atrial fibrillation: Secondary | ICD-10-CM | POA: Diagnosis not present

## 2013-04-07 DIAGNOSIS — I4891 Unspecified atrial fibrillation: Secondary | ICD-10-CM | POA: Diagnosis not present

## 2013-05-02 DIAGNOSIS — I4891 Unspecified atrial fibrillation: Secondary | ICD-10-CM | POA: Diagnosis not present

## 2013-05-20 DIAGNOSIS — I4891 Unspecified atrial fibrillation: Secondary | ICD-10-CM | POA: Diagnosis not present

## 2013-05-30 DIAGNOSIS — E782 Mixed hyperlipidemia: Secondary | ICD-10-CM | POA: Diagnosis not present

## 2013-05-30 DIAGNOSIS — G4730 Sleep apnea, unspecified: Secondary | ICD-10-CM | POA: Diagnosis not present

## 2013-05-30 DIAGNOSIS — I4891 Unspecified atrial fibrillation: Secondary | ICD-10-CM | POA: Diagnosis not present

## 2013-05-30 DIAGNOSIS — R609 Edema, unspecified: Secondary | ICD-10-CM | POA: Diagnosis not present

## 2013-06-05 DIAGNOSIS — I270 Primary pulmonary hypertension: Secondary | ICD-10-CM | POA: Diagnosis not present

## 2013-07-01 DIAGNOSIS — I4891 Unspecified atrial fibrillation: Secondary | ICD-10-CM | POA: Diagnosis not present

## 2013-07-17 DIAGNOSIS — R0602 Shortness of breath: Secondary | ICD-10-CM | POA: Diagnosis not present

## 2013-07-17 DIAGNOSIS — E782 Mixed hyperlipidemia: Secondary | ICD-10-CM | POA: Diagnosis not present

## 2013-07-17 DIAGNOSIS — G4730 Sleep apnea, unspecified: Secondary | ICD-10-CM | POA: Diagnosis not present

## 2013-07-17 DIAGNOSIS — R609 Edema, unspecified: Secondary | ICD-10-CM | POA: Diagnosis not present

## 2013-07-31 DIAGNOSIS — I4891 Unspecified atrial fibrillation: Secondary | ICD-10-CM | POA: Diagnosis not present

## 2013-08-18 DIAGNOSIS — I4891 Unspecified atrial fibrillation: Secondary | ICD-10-CM | POA: Diagnosis not present

## 2013-08-18 DIAGNOSIS — G2581 Restless legs syndrome: Secondary | ICD-10-CM | POA: Diagnosis not present

## 2013-08-18 DIAGNOSIS — M549 Dorsalgia, unspecified: Secondary | ICD-10-CM | POA: Diagnosis not present

## 2013-08-18 DIAGNOSIS — N529 Male erectile dysfunction, unspecified: Secondary | ICD-10-CM | POA: Diagnosis not present

## 2013-09-09 DIAGNOSIS — I4891 Unspecified atrial fibrillation: Secondary | ICD-10-CM | POA: Diagnosis not present

## 2013-09-26 DIAGNOSIS — H2510 Age-related nuclear cataract, unspecified eye: Secondary | ICD-10-CM | POA: Diagnosis not present

## 2013-09-30 DIAGNOSIS — I4891 Unspecified atrial fibrillation: Secondary | ICD-10-CM | POA: Diagnosis not present

## 2013-10-04 DIAGNOSIS — H2510 Age-related nuclear cataract, unspecified eye: Secondary | ICD-10-CM | POA: Diagnosis not present

## 2013-10-24 DIAGNOSIS — H2510 Age-related nuclear cataract, unspecified eye: Secondary | ICD-10-CM | POA: Diagnosis not present

## 2013-10-25 ENCOUNTER — Ambulatory Visit: Payer: Self-pay | Admitting: Ophthalmology

## 2013-10-25 DIAGNOSIS — Z7901 Long term (current) use of anticoagulants: Secondary | ICD-10-CM | POA: Diagnosis not present

## 2013-10-25 DIAGNOSIS — Z01810 Encounter for preprocedural cardiovascular examination: Secondary | ICD-10-CM | POA: Diagnosis not present

## 2013-10-25 DIAGNOSIS — I10 Essential (primary) hypertension: Secondary | ICD-10-CM | POA: Diagnosis not present

## 2013-10-25 DIAGNOSIS — Z79899 Other long term (current) drug therapy: Secondary | ICD-10-CM | POA: Diagnosis not present

## 2013-10-25 DIAGNOSIS — Z01812 Encounter for preprocedural laboratory examination: Secondary | ICD-10-CM | POA: Diagnosis not present

## 2013-10-25 DIAGNOSIS — H2510 Age-related nuclear cataract, unspecified eye: Secondary | ICD-10-CM | POA: Diagnosis not present

## 2013-10-25 LAB — PROTIME-INR
INR: 1.8
Prothrombin Time: 20.3 secs — ABNORMAL HIGH (ref 11.5–14.7)

## 2013-10-25 LAB — POTASSIUM: Potassium: 3.9 mmol/L (ref 3.5–5.1)

## 2013-10-30 DIAGNOSIS — I4891 Unspecified atrial fibrillation: Secondary | ICD-10-CM | POA: Diagnosis not present

## 2013-11-06 ENCOUNTER — Ambulatory Visit: Payer: Self-pay | Admitting: Ophthalmology

## 2013-11-06 DIAGNOSIS — H2510 Age-related nuclear cataract, unspecified eye: Secondary | ICD-10-CM | POA: Diagnosis not present

## 2013-11-06 DIAGNOSIS — Z79899 Other long term (current) drug therapy: Secondary | ICD-10-CM | POA: Diagnosis not present

## 2013-11-06 DIAGNOSIS — G2581 Restless legs syndrome: Secondary | ICD-10-CM | POA: Diagnosis not present

## 2013-11-06 DIAGNOSIS — I2510 Atherosclerotic heart disease of native coronary artery without angina pectoris: Secondary | ICD-10-CM | POA: Diagnosis not present

## 2013-11-06 DIAGNOSIS — E7800 Pure hypercholesterolemia, unspecified: Secondary | ICD-10-CM | POA: Diagnosis not present

## 2013-11-06 DIAGNOSIS — H269 Unspecified cataract: Secondary | ICD-10-CM | POA: Diagnosis not present

## 2013-11-06 DIAGNOSIS — G4730 Sleep apnea, unspecified: Secondary | ICD-10-CM | POA: Diagnosis not present

## 2013-11-06 DIAGNOSIS — Z951 Presence of aortocoronary bypass graft: Secondary | ICD-10-CM | POA: Diagnosis not present

## 2013-11-06 DIAGNOSIS — Z7901 Long term (current) use of anticoagulants: Secondary | ICD-10-CM | POA: Diagnosis not present

## 2013-11-06 DIAGNOSIS — I10 Essential (primary) hypertension: Secondary | ICD-10-CM | POA: Diagnosis not present

## 2013-11-06 DIAGNOSIS — I4891 Unspecified atrial fibrillation: Secondary | ICD-10-CM | POA: Diagnosis not present

## 2013-11-14 DIAGNOSIS — R0602 Shortness of breath: Secondary | ICD-10-CM | POA: Insufficient documentation

## 2013-11-14 DIAGNOSIS — I10 Essential (primary) hypertension: Secondary | ICD-10-CM | POA: Diagnosis not present

## 2013-11-14 DIAGNOSIS — I4891 Unspecified atrial fibrillation: Secondary | ICD-10-CM | POA: Diagnosis not present

## 2013-11-14 DIAGNOSIS — I25810 Atherosclerosis of coronary artery bypass graft(s) without angina pectoris: Secondary | ICD-10-CM | POA: Diagnosis not present

## 2013-11-15 DIAGNOSIS — H2510 Age-related nuclear cataract, unspecified eye: Secondary | ICD-10-CM | POA: Diagnosis not present

## 2013-11-22 ENCOUNTER — Ambulatory Visit: Payer: Self-pay | Admitting: Ophthalmology

## 2013-11-22 DIAGNOSIS — I10 Essential (primary) hypertension: Secondary | ICD-10-CM | POA: Diagnosis not present

## 2013-11-22 DIAGNOSIS — H2510 Age-related nuclear cataract, unspecified eye: Secondary | ICD-10-CM | POA: Diagnosis not present

## 2013-11-22 DIAGNOSIS — R609 Edema, unspecified: Secondary | ICD-10-CM | POA: Diagnosis not present

## 2013-11-22 DIAGNOSIS — Z951 Presence of aortocoronary bypass graft: Secondary | ICD-10-CM | POA: Diagnosis not present

## 2013-11-22 DIAGNOSIS — Z7901 Long term (current) use of anticoagulants: Secondary | ICD-10-CM | POA: Diagnosis not present

## 2013-11-22 DIAGNOSIS — E7800 Pure hypercholesterolemia, unspecified: Secondary | ICD-10-CM | POA: Diagnosis not present

## 2013-11-22 DIAGNOSIS — IMO0002 Reserved for concepts with insufficient information to code with codable children: Secondary | ICD-10-CM | POA: Diagnosis not present

## 2013-11-22 DIAGNOSIS — G2581 Restless legs syndrome: Secondary | ICD-10-CM | POA: Diagnosis not present

## 2013-11-22 DIAGNOSIS — I4891 Unspecified atrial fibrillation: Secondary | ICD-10-CM | POA: Diagnosis not present

## 2013-11-22 DIAGNOSIS — Z79899 Other long term (current) drug therapy: Secondary | ICD-10-CM | POA: Diagnosis not present

## 2013-11-22 DIAGNOSIS — H269 Unspecified cataract: Secondary | ICD-10-CM | POA: Diagnosis not present

## 2013-11-22 DIAGNOSIS — G4730 Sleep apnea, unspecified: Secondary | ICD-10-CM | POA: Diagnosis not present

## 2013-12-02 DIAGNOSIS — I4891 Unspecified atrial fibrillation: Secondary | ICD-10-CM | POA: Diagnosis not present

## 2013-12-04 DIAGNOSIS — I10 Essential (primary) hypertension: Secondary | ICD-10-CM | POA: Diagnosis not present

## 2013-12-04 DIAGNOSIS — I4891 Unspecified atrial fibrillation: Secondary | ICD-10-CM | POA: Diagnosis not present

## 2013-12-04 DIAGNOSIS — R0602 Shortness of breath: Secondary | ICD-10-CM | POA: Diagnosis not present

## 2013-12-04 DIAGNOSIS — I25810 Atherosclerosis of coronary artery bypass graft(s) without angina pectoris: Secondary | ICD-10-CM | POA: Diagnosis not present

## 2013-12-15 DIAGNOSIS — H04129 Dry eye syndrome of unspecified lacrimal gland: Secondary | ICD-10-CM | POA: Diagnosis not present

## 2014-01-15 DIAGNOSIS — R609 Edema, unspecified: Secondary | ICD-10-CM | POA: Diagnosis not present

## 2014-01-15 DIAGNOSIS — E785 Hyperlipidemia, unspecified: Secondary | ICD-10-CM | POA: Diagnosis not present

## 2014-01-15 DIAGNOSIS — G2581 Restless legs syndrome: Secondary | ICD-10-CM | POA: Diagnosis not present

## 2014-01-15 DIAGNOSIS — I4891 Unspecified atrial fibrillation: Secondary | ICD-10-CM | POA: Diagnosis not present

## 2014-01-17 DIAGNOSIS — R609 Edema, unspecified: Secondary | ICD-10-CM | POA: Diagnosis not present

## 2014-01-17 DIAGNOSIS — E785 Hyperlipidemia, unspecified: Secondary | ICD-10-CM | POA: Diagnosis not present

## 2014-01-17 DIAGNOSIS — I4891 Unspecified atrial fibrillation: Secondary | ICD-10-CM | POA: Diagnosis not present

## 2014-01-25 DIAGNOSIS — I2510 Atherosclerotic heart disease of native coronary artery without angina pectoris: Secondary | ICD-10-CM | POA: Diagnosis not present

## 2014-01-25 DIAGNOSIS — N189 Chronic kidney disease, unspecified: Secondary | ICD-10-CM | POA: Diagnosis not present

## 2014-01-25 DIAGNOSIS — R609 Edema, unspecified: Secondary | ICD-10-CM | POA: Diagnosis not present

## 2014-01-25 DIAGNOSIS — N179 Acute kidney failure, unspecified: Secondary | ICD-10-CM | POA: Diagnosis not present

## 2014-01-26 DIAGNOSIS — I4891 Unspecified atrial fibrillation: Secondary | ICD-10-CM | POA: Diagnosis not present

## 2014-02-02 ENCOUNTER — Ambulatory Visit: Payer: Self-pay | Admitting: Nephrology

## 2014-02-02 DIAGNOSIS — N179 Acute kidney failure, unspecified: Secondary | ICD-10-CM | POA: Diagnosis not present

## 2014-02-02 DIAGNOSIS — N200 Calculus of kidney: Secondary | ICD-10-CM | POA: Diagnosis not present

## 2014-02-06 DIAGNOSIS — G2581 Restless legs syndrome: Secondary | ICD-10-CM | POA: Diagnosis not present

## 2014-02-06 DIAGNOSIS — R609 Edema, unspecified: Secondary | ICD-10-CM | POA: Diagnosis not present

## 2014-02-06 DIAGNOSIS — N189 Chronic kidney disease, unspecified: Secondary | ICD-10-CM | POA: Diagnosis not present

## 2014-02-06 DIAGNOSIS — E785 Hyperlipidemia, unspecified: Secondary | ICD-10-CM | POA: Diagnosis not present

## 2014-02-06 DIAGNOSIS — Z23 Encounter for immunization: Secondary | ICD-10-CM | POA: Diagnosis not present

## 2014-02-12 DIAGNOSIS — N184 Chronic kidney disease, stage 4 (severe): Secondary | ICD-10-CM | POA: Diagnosis not present

## 2014-02-12 DIAGNOSIS — R609 Edema, unspecified: Secondary | ICD-10-CM | POA: Diagnosis not present

## 2014-02-12 DIAGNOSIS — I10 Essential (primary) hypertension: Secondary | ICD-10-CM | POA: Diagnosis not present

## 2014-03-13 DIAGNOSIS — N183 Chronic kidney disease, stage 3 (moderate): Secondary | ICD-10-CM | POA: Diagnosis not present

## 2014-03-13 DIAGNOSIS — I10 Essential (primary) hypertension: Secondary | ICD-10-CM | POA: Diagnosis not present

## 2014-03-13 DIAGNOSIS — G2581 Restless legs syndrome: Secondary | ICD-10-CM | POA: Diagnosis not present

## 2014-03-13 DIAGNOSIS — I4891 Unspecified atrial fibrillation: Secondary | ICD-10-CM | POA: Diagnosis not present

## 2014-03-18 DIAGNOSIS — I4891 Unspecified atrial fibrillation: Secondary | ICD-10-CM | POA: Diagnosis not present

## 2014-04-03 DIAGNOSIS — R0602 Shortness of breath: Secondary | ICD-10-CM | POA: Diagnosis not present

## 2014-04-03 DIAGNOSIS — I25810 Atherosclerosis of coronary artery bypass graft(s) without angina pectoris: Secondary | ICD-10-CM | POA: Diagnosis not present

## 2014-04-03 DIAGNOSIS — I10 Essential (primary) hypertension: Secondary | ICD-10-CM | POA: Diagnosis not present

## 2014-04-03 DIAGNOSIS — I482 Chronic atrial fibrillation: Secondary | ICD-10-CM | POA: Diagnosis not present

## 2014-04-13 DIAGNOSIS — I4891 Unspecified atrial fibrillation: Secondary | ICD-10-CM | POA: Diagnosis not present

## 2014-04-26 DIAGNOSIS — Z961 Presence of intraocular lens: Secondary | ICD-10-CM | POA: Diagnosis not present

## 2014-05-04 DIAGNOSIS — H3561 Retinal hemorrhage, right eye: Secondary | ICD-10-CM | POA: Diagnosis not present

## 2014-05-11 DIAGNOSIS — H43811 Vitreous degeneration, right eye: Secondary | ICD-10-CM | POA: Diagnosis not present

## 2014-05-14 DIAGNOSIS — R609 Edema, unspecified: Secondary | ICD-10-CM | POA: Diagnosis not present

## 2014-05-14 DIAGNOSIS — I10 Essential (primary) hypertension: Secondary | ICD-10-CM | POA: Diagnosis not present

## 2014-05-14 DIAGNOSIS — N184 Chronic kidney disease, stage 4 (severe): Secondary | ICD-10-CM | POA: Diagnosis not present

## 2014-05-20 DIAGNOSIS — I4891 Unspecified atrial fibrillation: Secondary | ICD-10-CM | POA: Diagnosis not present

## 2014-05-21 DIAGNOSIS — I10 Essential (primary) hypertension: Secondary | ICD-10-CM | POA: Diagnosis not present

## 2014-05-21 DIAGNOSIS — N179 Acute kidney failure, unspecified: Secondary | ICD-10-CM | POA: Diagnosis not present

## 2014-05-21 DIAGNOSIS — R609 Edema, unspecified: Secondary | ICD-10-CM | POA: Diagnosis not present

## 2014-06-01 DIAGNOSIS — Z961 Presence of intraocular lens: Secondary | ICD-10-CM | POA: Diagnosis not present

## 2014-06-08 DIAGNOSIS — I4891 Unspecified atrial fibrillation: Secondary | ICD-10-CM | POA: Diagnosis not present

## 2014-06-18 DIAGNOSIS — I10 Essential (primary) hypertension: Secondary | ICD-10-CM | POA: Diagnosis not present

## 2014-06-18 DIAGNOSIS — I4891 Unspecified atrial fibrillation: Secondary | ICD-10-CM | POA: Diagnosis not present

## 2014-06-18 DIAGNOSIS — N183 Chronic kidney disease, stage 3 (moderate): Secondary | ICD-10-CM | POA: Diagnosis not present

## 2014-06-18 DIAGNOSIS — M549 Dorsalgia, unspecified: Secondary | ICD-10-CM | POA: Diagnosis not present

## 2014-06-30 ENCOUNTER — Emergency Department: Payer: Self-pay | Admitting: Emergency Medicine

## 2014-06-30 DIAGNOSIS — E860 Dehydration: Secondary | ICD-10-CM | POA: Diagnosis not present

## 2014-06-30 DIAGNOSIS — R410 Disorientation, unspecified: Secondary | ICD-10-CM | POA: Diagnosis not present

## 2014-06-30 DIAGNOSIS — R252 Cramp and spasm: Secondary | ICD-10-CM | POA: Diagnosis not present

## 2014-06-30 DIAGNOSIS — Z79891 Long term (current) use of opiate analgesic: Secondary | ICD-10-CM | POA: Diagnosis not present

## 2014-06-30 DIAGNOSIS — Z7901 Long term (current) use of anticoagulants: Secondary | ICD-10-CM | POA: Diagnosis not present

## 2014-06-30 DIAGNOSIS — Z79899 Other long term (current) drug therapy: Secondary | ICD-10-CM | POA: Diagnosis not present

## 2014-06-30 DIAGNOSIS — R4182 Altered mental status, unspecified: Secondary | ICD-10-CM | POA: Diagnosis not present

## 2014-06-30 DIAGNOSIS — N289 Disorder of kidney and ureter, unspecified: Secondary | ICD-10-CM | POA: Diagnosis not present

## 2014-06-30 DIAGNOSIS — I10 Essential (primary) hypertension: Secondary | ICD-10-CM | POA: Diagnosis not present

## 2014-06-30 LAB — URINALYSIS, COMPLETE
Bacteria: NONE SEEN
Bilirubin,UR: NEGATIVE
Glucose,UR: NEGATIVE mg/dL (ref 0–75)
Hyaline Cast: 1
Ketone: NEGATIVE
Leukocyte Esterase: NEGATIVE
Nitrite: NEGATIVE
Ph: 7 (ref 4.5–8.0)
Protein: 30
RBC,UR: 1 /HPF (ref 0–5)
Specific Gravity: 1.016 (ref 1.003–1.030)
Squamous Epithelial: NONE SEEN
WBC UR: 1 /HPF (ref 0–5)

## 2014-06-30 LAB — COMPREHENSIVE METABOLIC PANEL
Albumin: 3.7 g/dL (ref 3.4–5.0)
Alkaline Phosphatase: 61 U/L (ref 46–116)
Anion Gap: 7 (ref 7–16)
BUN: 41 mg/dL — ABNORMAL HIGH (ref 7–18)
Bilirubin,Total: 0.6 mg/dL (ref 0.2–1.0)
Calcium, Total: 8.6 mg/dL (ref 8.5–10.1)
Chloride: 102 mmol/L (ref 98–107)
Co2: 27 mmol/L (ref 21–32)
Creatinine: 1.61 mg/dL — ABNORMAL HIGH (ref 0.60–1.30)
EGFR (African American): 54 — ABNORMAL LOW
EGFR (Non-African Amer.): 44 — ABNORMAL LOW
Glucose: 131 mg/dL — ABNORMAL HIGH (ref 65–99)
Osmolality: 284 (ref 275–301)
Potassium: 3.6 mmol/L (ref 3.5–5.1)
SGOT(AST): 28 U/L (ref 15–37)
SGPT (ALT): 26 U/L (ref 14–63)
Sodium: 136 mmol/L (ref 136–145)
Total Protein: 7.7 g/dL (ref 6.4–8.2)

## 2014-06-30 LAB — DIGOXIN LEVEL: Digoxin: 0.5 ng/mL

## 2014-06-30 LAB — CBC
HCT: 40.2 % (ref 40.0–52.0)
HGB: 13.4 g/dL (ref 13.0–18.0)
MCH: 30.8 pg (ref 26.0–34.0)
MCHC: 33.2 g/dL (ref 32.0–36.0)
MCV: 93 fL (ref 80–100)
Platelet: 186 10*3/uL (ref 150–440)
RBC: 4.34 10*6/uL — ABNORMAL LOW (ref 4.40–5.90)
RDW: 15.7 % — ABNORMAL HIGH (ref 11.5–14.5)
WBC: 8.9 10*3/uL (ref 3.8–10.6)

## 2014-06-30 LAB — MAGNESIUM: Magnesium: 2.2 mg/dL

## 2014-06-30 LAB — PROTIME-INR
INR: 1.1
Prothrombin Time: 14.1 secs

## 2014-06-30 LAB — TROPONIN I: Troponin-I: <0.02 ng/mL

## 2014-07-07 ENCOUNTER — Emergency Department: Payer: Self-pay | Admitting: Emergency Medicine

## 2014-07-07 DIAGNOSIS — I10 Essential (primary) hypertension: Secondary | ICD-10-CM | POA: Diagnosis not present

## 2014-07-07 DIAGNOSIS — R040 Epistaxis: Secondary | ICD-10-CM | POA: Diagnosis not present

## 2014-08-02 DIAGNOSIS — I10 Essential (primary) hypertension: Secondary | ICD-10-CM | POA: Diagnosis not present

## 2014-08-02 DIAGNOSIS — E782 Mixed hyperlipidemia: Secondary | ICD-10-CM | POA: Diagnosis not present

## 2014-08-02 DIAGNOSIS — I071 Rheumatic tricuspid insufficiency: Secondary | ICD-10-CM | POA: Diagnosis not present

## 2014-08-02 DIAGNOSIS — I25810 Atherosclerosis of coronary artery bypass graft(s) without angina pectoris: Secondary | ICD-10-CM | POA: Diagnosis not present

## 2014-09-01 ENCOUNTER — Emergency Department
Admit: 2014-09-01 | Disposition: A | Discharge status: Home / Self Care | Payer: Self-pay | Admitting: Emergency Medicine

## 2014-09-01 DIAGNOSIS — Z7901 Long term (current) use of anticoagulants: Secondary | ICD-10-CM | POA: Diagnosis not present

## 2014-09-01 DIAGNOSIS — Z79891 Long term (current) use of opiate analgesic: Secondary | ICD-10-CM | POA: Diagnosis not present

## 2014-09-01 DIAGNOSIS — I8391 Asymptomatic varicose veins of right lower extremity: Secondary | ICD-10-CM | POA: Diagnosis not present

## 2014-09-01 DIAGNOSIS — I10 Essential (primary) hypertension: Secondary | ICD-10-CM | POA: Diagnosis not present

## 2014-09-01 DIAGNOSIS — Z79899 Other long term (current) drug therapy: Secondary | ICD-10-CM | POA: Diagnosis not present

## 2014-09-01 DIAGNOSIS — I8392 Asymptomatic varicose veins of left lower extremity: Secondary | ICD-10-CM | POA: Diagnosis not present

## 2014-09-01 DIAGNOSIS — Z87891 Personal history of nicotine dependence: Secondary | ICD-10-CM | POA: Diagnosis not present

## 2014-09-01 LAB — CBC
HCT: 37.3 % — ABNORMAL LOW (ref 40.0–52.0)
HGB: 12.5 g/dL — ABNORMAL LOW (ref 13.0–18.0)
MCH: 30.8 pg (ref 26.0–34.0)
MCHC: 33.4 g/dL (ref 32.0–36.0)
MCV: 92 fL (ref 80–100)
Platelet: 182 10*3/uL (ref 150–440)
RBC: 4.04 10*6/uL — ABNORMAL LOW (ref 4.40–5.90)
RDW: 15.9 % — ABNORMAL HIGH (ref 11.5–14.5)
WBC: 8.4 10*3/uL (ref 3.8–10.6)

## 2014-09-15 NOTE — Op Note (Signed)
PATIENT NAME:  Vincent Mason, Vincent Mason MR#:  [id number] DATE OF BIRTH:  [birth year]  DATE OF PROCEDURE:  11/22/2013  PREOPERATIVE DIAGNOSIS: Cataract, right eye.   POSTOPERATIVE DIAGNOSIS: Cataract, right eye.   PROCEDURE PERFORMED: Extracapsular cataract extraction using phacoemulsification with placement of Alcon SN6AT4, 16.0 diopter posterior chamber lens with 2.25 diopters of cylinder, serial number [id number].   SURGEON: Loura Back. Zacharias Ridling, M.D.   ANESTHESIA: 4% Lidocaine and 0.75% Marcaine, a 50-50 mixture with 10 units/mL of Hylenex added, given as a peribulbar.   ANESTHESIOLOGIST: Dr. [place] Service.   COMPLICATIONS: None.   ESTIMATED BLOOD LOSS: Less than 1 mL.   DESCRIPTION OF PROCEDURE: The patient was brought to the operating room and a drop of proparacaine was placed in each eye. With the patient sitting upright and fixating at a distant target, the 3 o'clock and 9 o'clock positions were marked with a marking pen. The patient was placed supine, positioned and given IV sedation. He was then given a peribulbar block. A small bruise occurred in the upper lid from the upper injection.   The vertical rectus muscles were imbricated using 5-0 silk suture as bridle sutures. The [employer] system was turned on and registration was performed. The position of the 47 degree was given and a marking pen was used to mark position on the cornea. The Varian system was turned off after looking at the size of the capsulorhexis to be just inside of 5.5 mm.  Hemostasis was then obtained at the limbus with cautery and a partial-thickness scleral groove was made at the posterior surgical limbus with a 64 Beaver blade. This was dissected anteriorly into clear cornea with using an Alcon crescent knife and the anterior chamber was entered 45 degrees away from the incision with a paracentesis knife. The incision was made at 95 degrees which was marked also when the [facility] was turned on.   The anterior chamber was  entered through lamellar dissection with a 2.6 mm keratome and DisCoVisc used to replace the aqueous. A continuous tear circular capsulorhexis was carried out. Hydrodissection was used to loosen the nucleus and phacoemulsification was carried out in a divide and conquer technique.   Ultrasound time was one minute 24.5 seconds with an average power of 19.9% and CDE of 30.07. Irrigation-aspiration was used to remove the residual cortex. The capsular bag was inflated with DisCoVisc and the Verion was turned back on to indicate the position of the 47 degree mark. The intraocular lens was inserted in the capsular bag using a [employer]. The lens was rotated to align the haptics up with the 47 degrees mark. Irrigation-aspiration was used to remove residual DisCoVisc. The position of the lens was reconfirmed to be right on the 47 degree mark.   The wound was inflated with balanced salt. Miostat was injected through the paracentesis track and the wound was checked for leaks. None were found. Cefuroxime 0.1 mL containing 1 mg of drug was injected via the paracentesis track. The wound was checked again for leaks. None were found. The bridle sutures were removed. Two drops of Vigamox were placed in the eye. A shield was placed on the eye and the patient was discharged to the recovery area in good condition.   ____________________________ Loura Back Renalda Locklin, MD sad:lt D: 11/22/2013 11:23:00 ET T: 11/22/2013 13:05:26 ET JOB#: [id number] Vincent Lee MD ELECTRONICALLY SIGNED 11/27/2013 13:52

## 2014-09-15 NOTE — Op Note (Signed)
PATIENT NAME:  Vincent Mason, Vincent Mason MR#:  [id number] DATE OF BIRTH:  March 24, 1936  DATE OF PROCEDURE:  11/06/2013  PREOPERATIVE DIAGNOSIS: Cataract, left eye.   POSTOPERATIVE DIAGNOSIS: Cataract, left eye.  PROCEDURE PERFORMED: Extracapsular cataract extraction using phacoemulsification with placement Alcon SN6AT4 13.5 diopter posterior chamber lens with 2.25 diopters of cylinder, serial number [id number]   SURGEON: Remo Lipps A. Gurnoor Ursua, M.D.   ANESTHESIA: 4% lidocaine and 0.75% Marcaine a 50-50 mixture with 10 units/mL of HyoMax added, given as a peribulbar.   ANESTHESIOLOGIST: Dr. Myra Gianotti.   COMPLICATIONS: None.   ESTIMATED BLOOD LOSS: Less than 1 mL.   DESCRIPTION OF PROCEDURE: The patient was brought to the Operating Room and each eye was anesthetized with topical proparacaine. The patient was set upright and fixing at a distant target, the 3:00 and 9:00 positions were marked on his limbus with a marking pen. He was placed supine and given IV sedation and a peribulbar block. He was then prepped and draped in the usual fashion. The vertical rectus muscles were imbricated using 5-0 silk sutures, bridle sutures. The Verion was turned on and registration was noted. The axis of 96 degrees was noted on the Verion and a marking pen was used to marked at that position. Cautery was used to obtain hemostasis at the 95 degree position and a partial-thickness scleral groove was made there. This was dissected anteriorly into clear cornea with an Alcon crescent knife. The anterior chamber was entered superotemporally through clear cornea with a paracentesis knife and through the lamellar section with 2.6 mm keratome. DisCoVisc was used to place the aqueous and continuous tear circular capsulorrhexis was carried out. Hydrodissection was used to loosen the nucleus and phacoemulsification was carried out in divide and conquer technique. Ultrasound time was one minute and 13 seconds with an average power of 23.8% with a  CDE of 28.08. Irrigation/aspiration was used to remove the residual cortex. The capsular bag was inflated using DisCoVisc and the intraocular lens was inserted into the capsular bag. The Verion was turned back on to show the axis and the lens was nudged to line up exactly with 96 degrees access. Irrigation-aspiration was used to remove the residual DisCoVisc and the position of the lens was checked again. It was slightly out of position so it was nudged again to get the markings on the haptics in line with the [employer] system. The wound was inflated with balanced salt. Miostat was injected through the paracentesis track and a tenth of a milliliter of cefuroxime containing 1 mg of drug was injected via the paracentesis track. The wound was checked for leaks. None were found. The bridle sutures were removed and three drops of Vigamox were placed in the eye. A shield was placed on the eye and the patient was discharged to the recovery room in good condition.    ____________________________ Vincent Back Shivali Quackenbush, MD sad:sg D: 11/06/2013 13:43:08 ET T: 11/06/2013 15:41:09 ET JOB#: [id number]  cc: Remo Lipps A. Stormee Duda, MD, <Dictator> Martie Lee MD ELECTRONICALLY SIGNED 11/13/2013 13:45

## 2014-09-16 NOTE — Consult Note (Signed)
PATIENT NAME:  Vincent Mason, Vincent Mason MR#:  [id number] DATE OF BIRTH:  04-20-1936  DATE OF CONSULTATION:  09/01/2011  REFERRING PHYSICIAN:   CONSULTING PHYSICIAN:  Corey Skains, MD  DAILY PROGRESS NOTE   The patient has felt much better overnight with no evidence of syncope or other rhythm disturbances.   PHYSICAL EXAMINATION: Unchanged.   The patient has had chart reviewed, medications reviewed and ordered. Radiology reviewed. Vital signs reviewed. Laboratories reviewed.   ASSESSMENT: 79 year old male with syncope, atrial fibrillation with some sick sinus syndrome, hypertension, and mild dehydration slightly improved at this time on appropriate medications including anticoagulation.   PLAN: No change in current medical regimen of heart rate control or anticoagulation. The patient should ambulate and follow for improvements today and would consider the possibility of discharge to home with close follow-up.    ____________________________ Corey Skains, MD bjk:rbg D: 09/03/2011 08:10:40 ET T: 09/03/2011 12:27:35 ET JOB#: [id number]  cc: Corey Skains, MD, <Dictator> Corey Skains MD ELECTRONICALLY SIGNED 09/03/2011 13:18

## 2014-09-16 NOTE — Consult Note (Signed)
PATIENT NAME:  Vincent Mason, Vincent Mason MR#:  [id number] DATE OF BIRTH:  [birth year]  DATE OF CONSULTATION:  08/31/2011  REFERRING PHYSICIAN:  Dr. Candiss Norse  CONSULTING PHYSICIAN:  Rudell Cobb. Loletta Specter, MD  HISTORY:  Mr. 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 Lelon Huh with history of remote tobacco use, coronary artery disease status post four-vessel bypass surgery in 2002 at [facility], Coumadin treatment for chronic atrial fibrillation, restless legs problems with history of degenerative disc disease status post cervical spine disc surgery approximately 10 years ago. He was admitted early 08/31/2011 with pneumonia and is referred for evaluation of recurrent dizziness. History comes from the patient, his friend Dr. Durwin Reges who was present in the examination room, his hospital chart, and hospital records.   The patient reports approximately a six-month history of occasionally having slightly poor balance and dizziness, occurring on his feet, occasionally with standing up quickly from bed, and otherwise occurring while standing still, as when preparing to hit a golf ball. He describes this as a brief feeling as if he might be about to fall backward, so that he will step back with one foot to balance himself. He denies vertigo or feeling of lightheadedness. He denies symptoms of vertebrobasilar insufficiency, any problems with looking upward while  on his feet or with bending over with head bent back. He was noted on admission to report occasionally having times of feeling weak all over, somewhat more so in the weeks prior to admission. He reports checking blood pressure at home infrequently, and that a few weeks ago he checked his blood pressure and found it low at 60/40.   Brain MRI scan performed recently was benign. Doppler study of the carotid and vertebral arteries performed on 08/31/2011 showed no evidence of surgical disease. CT scan of the  chest on admission was read as consistent with bilateral pneumonia. He has had orthostatic vital signs checked today with normal findings. He denies history of sinus problems recently or in the past. He has no history of diabetes or history in the past of stroke or temporary stroke, migraine headaches, meningitis, seizures, or significant head injuries.   PHYSICAL EXAMINATION: The patient is a well developed and well nourished white gentleman who was pleasant and cooperative in no apparent distress with blood pressure semisupine 145/80 and heart rate 84. There was no fever. He has had low anterior neck well healed surgical scar from remote cervical spine surgery. He was otherwise normocephalic without evidence of trauma. His neck was supple with decrease of cervical range of motion most to extension and to bending, most with bending to his left side. He was alert and fully oriented with clear speech and normal expression. He was lucid and a good historian with normal affect. Cognitive testing was not performed in detail. Cranial nerve examination was symmetric and normal including eye movements and visual field to finger count for each eye; visual acuity was not tested. Hearing acuity was normal to finger rubs bilaterally. His gait was not impaired and was symmetric. Motor examination of the extremities showed normal tone and bulk throughout and symmetric full power throughout. Extremity sensation was symmetric and normal including vibration at the great toes. Reflexes were symmetric and rated 2+ throughout. Coordination examination was normal with the exception of slight bilateral finger to nose intention tremor.   IMPRESSION: Occasional brief bouts of mild poor balance dizziness while standing still or associated with rapidly standing from bed, most suggestive  of transient decrease of cerebral perfusion, drop of blood pressure, in a patient with history of coronary artery disease and atrial fibrillation, and  with reported blood pressure low at 60/40 when checked two weeks ago at home. His clinical picture does not suggest an inner ear or peripheral vestibular problem.   RECOMMENDATIONS:  1. The patient is encouraged to sit at the side of the bed before standing on a regular basis.  2. I recommend followup with cardiology with regard to drop of blood pressure; consider a 30-day loop event monitor study.   I appreciate being asked to see this pleasant and interesting gentleman.   ____________________________ Rudell Cobb. Loletta Specter, MD prc:bjt D: 08/31/2011 17:56:00 ET T: 09/01/2011 08:15:49 ET JOB#: [id number]  cc: Rudell Cobb. Loletta Specter, MD, <Dictator> Linton Flemings MD ELECTRONICALLY SIGNED 09/16/2011 11:17

## 2014-09-16 NOTE — Discharge Summary (Signed)
PATIENT NAME:  Vincent Mason, Vincent Mason MR#:  [id number] DATE OF BIRTH:  1936-02-14  DATE OF ADMISSION:  08/31/2011 DATE OF DISCHARGE:  09/01/2011   PRIMARY CARE PHYSICIAN: Lelon Huh, MD   PRIMARY CARDIOLOGIST: Serafina Royals, MD   REASON FOR ADMISSION: Shortness of breath, cough, dizziness, generalized weakness, mild confusion.   DISCHARGE DIAGNOSES:  1. Bilateral pneumonia, likely community-acquired pneumonia.  2. Dehydration.  3. Acute renal failure.  4. Dizziness and generalized weakness likely secondary to pneumonia, dehydration. 5. Confusion likely secondary to pneumonia and dehydration.  6. Chronic atrial fibrillation.  7. Coronary artery disease status post coronary artery bypass graft.  8. Hyperlipidemia.  9. Restless leg syndrome. 10. History of chronic atrial fibrillation.  11. History of coronary artery disease, status post CABG. 12. History of hyperlipidemia.  13. Chronic atrial fibrillation and patient on anti-coagulation with supratherapeutic INR with Coumadin dose adjusted.   CONSULTS: Cardiology with Dr. Ubaldo Glassing and Dr. Nehemiah Massed    DISCHARGE DISPOSITION: Home.   DISCHARGE MEDICATIONS:  1. Omnicef 300 mg p.o. q.12 hours x5 days.  2. Azithromycin 250 mg p.o. daily x2 days.  3. Coumadin 6 mg p.o. q.24 hours. 4. Oxycodone 20 mg p.o. q.12 hours p.r.n. pain. 5. Valium 5 mg p.o. q.12 hours p.r.n. dizziness or anxiety/agitation. Have your doctor gradually taper this until eventually discontinued. 6. Toprol-XL 100 mg p.o. daily. 7. Benazepril 20 mg p.o. b.i.d.  8. Crestor 40 mg p.o. daily  9. Digoxin 125 mcg p.o. daily. 10. Ropinirole 1 mg p.o. b.i.d.   DISCHARGE INSTRUCTIONS:  1. Take medications as prescribed.  2. Return to the Emergency Department for recurrence of symptoms. 3. Take narcotics (oxycodone) only as needed for pain and you should have this gradually weaned and tapered off until eventually discontinued by your primary care physician.   FOLLOW-UP  INSTRUCTIONS:  1. Follow-up with Dr. Caryn Section within 1 to 2 weeks. The patient needs repeat PT/INR in the next 2 to 3 days. 2. Follow-up with Dr. Nehemiah Massed within 1 to 2 weeks.   PROCEDURES:  1. Noncontrast head CT 08/30/2011 no acute intracranial abnormalities are noted. There are involutional changes.  2. Chest x-ray, PA and lateral, 08/30/2011, findings suggest mild pulmonary interstitial edema more focal in the left lower lobe but infection such as pneumonia is not excluded.  3. CT chest without contrast 08/30/2011 there are bilateral multilobar reticular nodular interstitial opacities concerning for bronchiolitis versus pneumonia.  4. Bilaterally carotid Doppler ultrasound 08/31/2011 no hemodynamically significant stenosis identified in either side. There is antegrade flow noted in both vertebrals.  5. 2-D echocardiogram 09/32/3557 LV systolic function normal, EF greater than 55%, left atrium is mildly dilated, aortic valve opens well, moderate TR, RV is mildly dilated, mild to moderate MR, right ventricular systolic pressure is elevated at 40 to 50 mmHg.   PERTINENT LABORATORY DATA: INR 3.9 on admission and 2.7 on the day of discharge. Blood cultures x2 from 08/30/2011 no growth to date. CBC normal on admission. Urine drug screen on admission positive for opiates and benzos. LFTs normal on admission. BUN 26, creatinine 2.08 on admission with BUN 18, creatinine 1.17 on the day of discharge. Troponin less than 0.02 on admission. Serum digoxin level was 0.65.    BRIEF HISTORY/HOSPITAL COURSE: The patient is a 79 year old male with past medical history of coronary artery disease status post coronary artery bypass graft, chronic atrial fibrillation hyperlipidemia, and RLS who presented to the Emergency Department with complaints of shortness of breath, cough, dizziness, and was noted to  have generalized weakness and was mildly confused at times per his wife. Please see dictated admission history and  physical for pertinent details surrounding the onset of this hospitalization and please see below for further details.  1. Bilateral pneumonia, likely community-acquired pneumonia as evidenced by abnormal chest x-ray and chest CT with CT of the chest suggestive of multilobar pneumonia bilaterally. He also presented in a volume deplete state with dry mucous membranes and poor skin turgor and evidence of acute renal failure. His admitting diagnosis was pneumonia and dehydration. Blood cultures were obtained. The patient was empirically started on IV antibiotics for his pneumonia and for his dehydration he was started on IV fluids. With these measures, the patient's overall clinical condition has significantly improved and he is no longer confused or feeling weak and his shortness of breath has resolved and his cough has significantly improved. His dizziness is also resolved and he was felt to be dizzy due to dehydration, volume depletion, and pneumonia. Apparently he's had an extensive work-up of his dehydration as an outpatient with an outpatient MRI which was negative per the patient. Also since he was dizzy he underwent a noncontrast head CT in the ER which was negative for acute intracranial abnormalities. We obtained an echocardiogram which is benign other than mild to moderate MR and TR, mild pulmonary hypertension which would not explain the patient's dizziness. He did well with IV fluids and IV antibiotics and has been rehydrated and his pneumonia is clinically resolving. He was seen by our PT team and did well and ambulated well without any dizziness or weakness. PT recommends having the patient discharged home without any outpatient rehab or services needed at this time. Of note, dizziness could also be from overmedication as he was taking oxycodone 20 mg p.o. q.4 hours p.r.n. at home for RLS and also taking Valium and I advised him to limit the use of narcotics and have changed his oxycodone to q.12 hours  p.r.n. He was advised to have his primary care physician gradually wean and taper this over time until it's eventually discontinued and the same with his Valium as he was taking this for dizziness. Suspect that he is being overmedicated especially with narcotics and benzodiazepines. We do not want ti abruptly discontinue these medications given the potential for withdrawal. The patient will likely benefit from a pain management evaluation as an outpatient but this will be deferred to his primary care physician. His RLS should not be treated with narcotics and he was advised of this. He was advised to continue taking ropinirole for his RLS and may need referral to Neurology consultation as an outpatient as well.  2. In regards to his pneumonia, this is likely community-acquired. His blood cultures did not reveal any growth to date and after his condition was noted to have improved he was transitioned off IV antibiotics onto p.o. antibiotics and was advised a one week course. He was 7-day course of cephalosporin therapy in total and has completed 2 out of 7 days of ceftriaxone and will be discharged home on five more days of Omnicef. He was also prescribed a five-day course of azithromycin and has completed two inpatient days of IV antibiotics and has three additional days of Zithromax remaining at the time of discharge.  3. Dehydration as evidenced with acute renal failure, generalized weakness, dizziness, and mild confusion. After rehydration with IV fluids, the patient's symptoms have clinically resolved and he is now euvolemic and have also advised and encouraged him to  maintain adequate oral intake and hydration.  4. Acute renal failure, nonoliguric, and appeared prerenal from volume depletion. Nephrotoxins were held. Initially his ACE inhibitor was held. He was rehydrated with IV fluids which lead to normalization of his renal function as well as resolution of his dizziness, weakness, and confusion. He is now  euvolemic at the time of discharge and IV fluids have been stopped and his ACE inhibitor has been resumed. He was advised to maintain adequate oral intake and hydration as above.   5. Chronic atrial fibrillation, stable on telemonitoring throughout his hospitalization. His rate has remained well controlled on digoxin and beta-blocker which he will continue. Digoxin dose is therapeutic. However, he is on anticoagulation at baseline with Coumadin but was noted to have a supratherapeutic INR on admission for which his Coumadin dose has been adjusted and INR is now therapeutic at the time of discharge. He will need to follow-up with Cardiology closely as an outpatient for further management of his chronic atrial fibrillation as well as his coronary artery disease. He was seen in-house by cardiologists, Dr. Ubaldo Glassing and Dr. Nehemiah Massed, for his dizziness, both of whom were in agreement that this was likely a noncardiac cause and likely secondary to dehydration and pneumonia. They reviewed the patient's echocardiogram and otherwise did not feel any further inpatient cardiac diagnostics were recommended at this time but he may benefit from an outpatient Holter monitor versus cardiac event monitor but this will be managed by Dr. Nehemiah Massed as an outpatient. His atrial fibrillation has remained rate controlled during this hospital. He will continue beta-blocker and digoxin therapy.  6. In regards to coronary artery disease status post CABG, this was stable. The patient was without any chest pain. His troponin was negative on admission. He will continue medical management with Coumadin, beta-blocker, and statin therapy as well as ACE inhibitor and he will follow-up with Cardiology closely as an outpatient.  7. Hyperlipidemia. The patient is to continue statin therapy.   On 09/01/2011, the patient was hemodynamically stable without any shortness of breath, weakness, dizziness, or confusion and his cough had significantly improved  and he is felt to be stable for discharge home with close outpatient follow-up to which the patient was agreeable.   TIME SPENT ON DISCHARGE: Greater than 30 minutes.   ____________________________ Romie Jumper, MD knl:drc D: 09/04/2011 15:51:19 ET T: 09/07/2011 08:57:05 ET JOB#: [id number]  cc: Romie Jumper, MD, <Dictator> [place] Caryn Section, MD Corey Skains, MD Romie Jumper MD ELECTRONICALLY SIGNED 09/15/2011 17:50

## 2014-09-16 NOTE — Consult Note (Signed)
General Aspect the patient is a 79 year old male with history of coronary artery disease status post coronary artery bypass grafting in 1999 with a left internal mammary to the LAD, saphenous vein graft to the first obtuse marginal, distal circumflex, distal right coronary artery, with cardiac catheterization in 2011 revealing occluded native vessels with patent saphenous vein graft to the acute marginal, PDA, and a left internal mammary to the LAD. He also has a history of atrial fibrillation, hyperlipidemia hypertension. He was admitted with progressive dizziness. His atrial fibrillation is treated with rate control and chronic anticoagulation. He has a history of valvular heart disease with evidence of at least mild aortic stenosis. He denies syncope. In the emergency room chest x-ray revealed probable pneumonia. Patient was placed on empiric antibiotics in gently hydrated with dramatic improvement in his symptoms. He has no further dizziness, lightheadedness weakness or fatigue. He is anxious to be discharged as soon as possible. He denies fever chills or cough.he remains quite active and actually has been playing golf several times a week up until his recent illness.  the patient is ruled out for myocardial infarction.   Physical Exam:   GEN well developed, well nourished, no acute distress    HEENT PERRL, hearing intact to voice    NECK No masses    RESP no use of accessory muscles  rhonchi    CARD Irregular rate and rhythm  Murmur    Murmur Systolic    Systolic Murmur Out flow    ABD denies tenderness  no liver/spleen enlargement  normal BS  no Adominal Mass    LYMPH negative neck, negative axillae    EXTR negative cyanosis/clubbing, negative edema    SKIN normal to palpation    NEURO cranial nerves intact, motor/sensory function intact    PSYCH A+O to time, place, person   Review of Systems:   Subjective/Chief Complaint dizziness and lightheadedness with weakness and  fatigue as well ascough    General: Fatigue  Weakness    Skin: No Complaints    ENT: No Complaints    Eyes: No Complaints    Neck: No Complaints    Respiratory: Frequent cough  Short of breath    Cardiovascular: No Complaints    Gastrointestinal: No Complaints    Genitourinary: No Complaints    Vascular: No Complaints    Musculoskeletal: No Complaints    Neurologic: Dizzness    Hematologic: No Complaints    Endocrine: No Complaints    Psychiatric: No Complaints    Review of Systems: All other systems were reviewed and found to be negative    Medications/Allergies Reviewed Medications/Allergies reviewed     CAD:    Restless Leg Syndrome:    Atrial Fibrillation:    CABG (Coronary Artery Bypass Graft):   Home Medications: Medication Instructions Status  Toprol-XL 100 mg oral tablet, extended release 1 mg orally once a day  Active  diazepam  orally  Active  benazepril 20 mg oral tablet tab(s) orally 2 times a day Active  Coumadin 7.5 mg oral tablet 1 tab(s) orally once a day Active  Crestor 40 mg oral tablet 1 tab(s) orally once a day (at bedtime) Active  digoxin 125 mcg (0.125 mg) oral tablet 1 tab(s) orally once a day Active  ropinirole 1 mg oral tablet tab(s) orally 2 times a day Active  oxycodone 20 mg oral tablet 1 tab(s) orally every 4 hours, As Needed Active   EKG:   Interpretation atrial fibrillation with controlled  ventricular response    Shellfish: Rash    Impression 79 year old male with history of coronary disease status post coronary artery bypass grafting, history of chronic atrial fibrillation treated with rate control and chronic anticoagulation who was admitted with progressive weakness and fatigue and shortness of breath. Was noted to be also relatively hypovolemic which explains his dizziness. Etiology of his dizziness appears to be secondary to his hypokalemia. He does not appear to be ischemic by history. His left ventricular function is  well-preserved with no high-grade valvular disease as etiology of his symptoms. His atrial fibrillation rate control appears to be adequate with digoxin and Toprol-XL. Would ambulate and if stable consider discharge with outpatient followup    Plan 1. Continue with current medications including gentle hydration and empiric antibiotics 2. Ambulate and if stable convert to oral antibiotics and discharge on current rate control regimen as well as chronic anticoagulation therapy with Coumadin 3. Further outpatient workup to include consideration for use of a Holter monitor to assess for significant arrhythmias etiology of symptoms however patient has had no significant tachycardia or bradycardia arrhythmias during this hospitalization.   Electronic Signatures: Teodoro Spray (MD)  (Signed 08-Apr-13 21:19)  Authored: General Aspect/Present Illness, History and Physical Exam, Review of System, Past Medical History, Home Medications, EKG , Allergies, Impression/Plan   Last Updated: 08-Apr-13 21:19 by Teodoro Spray (MD)

## 2014-09-16 NOTE — H&P (Signed)
PATIENT NAME:  Vincent Mason, Vincent Mason MR#:  [id number] DATE OF BIRTH:  July 30, 1935  DATE OF ADMISSION:  08/31/2011  PRIMARY CARE PHYSICIAN: Lelon Huh, MD  CARDIOLOGIST: Serafina Royals, MD  HISTORY OF PRESENT ILLNESS: This is a pleasant 80 year old Caucasian gentleman who has past medical and surgical history of: 1. Atrial fibrillation, patient is on Coumadin. 2. Coronary artery disease status post CABG more than 10 years ago. 3. Restless leg syndrome.  The patient was in his usual state of health until about one month ago when he was exposed to his grandson who had URI like symptoms. Since that time, the patient has experienced some generalized weakness with some episodes of dizziness. He has seen his primary care physician and underwent a MRI as an outpatient about one week ago, which was unremarkable, for evaluation of his dizziness. He also complains that at times he has episodes where he feels weak all over. For the last three to four days he has developed a dry cough, also since yesterday he has been running some fevers for which he presents to the ER after feeling some shortness of breath also.   In the ER, the patient was found to be febrile, his chest x-ray was inconclusive and thereafter a CT of the chest, noncontrast, was done which showed bilateral pneumonia, his head CT was unremarkable, and I was called to admit the patient. The patient was also found to be in acute renal failure, his lactate was slightly elevated. I was called to admit the patient for community-acquired pneumonia.   During my interview, the patient was noted to be sitting comfortably in bed, he was in no respiratory distress, he did appear slightly confused and his wife says that on and off he has been getting somewhat confused for the past few days. He did not have any focal neurological deficits. His review of systems was essentially unremarkable, except for cough and fever, generalized weakness and dizziness, as dictated  above.  REVIEW OF SYSTEMS: A full 10-point review of systems was obtained, except as dictated below, all other review of systems are negative.   In my interview, the patient agrees to low grade fevers since yesterday, no chills, denies any headache, denies any new problems in hearing, he denies any nausea or vomiting, he denies any chest pain or feelings of palpitations, he does agree to a dry cough, he denies feeling short of breath, he denies any abdominal pain or discomfort, his bowel movements are hard at baseline, no change in that pattern, denies any blood in stool or urine, denies any dysuria, denies any new joint pains or aches, he denies any focal weakness in one extremity, denies any facial droop, denies any problems swallowing foods or liquids, denies any new mental stressors, and denies any recent weight gain or weight loss.   PAST MEDICAL/PAST SURGICAL HISTORY: As above.  FAMILY HISTORY: Negative for coronary artery disease.   PERSONAL HISTORY: Quit smoking more than 30 years ago. No alcohol abuse.   HOME MEDICATIONS:  1. Toprol XL 100 mg p.o. daily. 2. Digoxin 0.125 mg p.o. daily. 3. Coumadin 7.5 mg p.o. daily. 4. Crestor 40 mg 1/2 pill p.o. daily. 5. Ropinirole 1 mg p.o. daily. 6. Benazepril 20 mg p.o. daily.  DRUG ALLERGIES: No known drug allergies.   PHYSICAL EXAMINATION:   VITALS:  Temperature 101.6, pulse 98, respirations 28, blood pressure 145/67, and 94% on room air.   GENERAL: Pleasant, elderly Caucasian gentleman sitting in hospital bed in no apparent discomfort.  HEENT: Normocephalic, atraumatic head. Pupils are equally round and reactive to light and accommodation. Pink and moist tongue and throat. No scleral icterus.   NECK: Supple. No JVD.   CNS: Alert, awake, and oriented x3. All cranial nerves intact. No focal neurological deficits. (At times he appears to be slightly more talkative and may be very mildly confused at best)  PSYCH: Insight is intact. He  is not suicidal or homicidal.   LUNGS: Chest wall movement bilaterally symmetrical. He has bibasilar rales.   CVS: Irregularly, irregular rate and rhythm. Normal S1 and S2. No gallops, rubs, or murmurs. Old CABG scar overlying his sternum.   ABDOMEN: Soft. Positive bowel sounds. Nontender.  EXTREMITIES: No cyanosis, clubbing, or edema.   LYMPH: No palpable cervical lymph nodes.   LABS/STUDIES: White count 9.3, hemoglobin 12.3, hematocrit 36.8, and platelets 171. INR 3.9. Sodium 141, potassium 4.4, chloride 106, bicarbonate 26, BUN 26, creatinine 2.08, and glucose 125. Anion gap 9. Liver enzymes unremarkable.   Urine drug screen positive for opioids and benzodiazepine.   ABG: pH 7.41, PO2 62, PCO2 39, and bicarbonate 24.7.  Urinalysis is unremarkable.   Lactic acid level is 1.5, which is mildly elevated.   CT of the chest: Bilateral infiltrates.   CT of brain: No acute findings.  EKG: Atrial fibrillation, rate 98 beats per minute. QTC 400 m/sec. No acute ST-T wave changes.  ASSESSMENT AND PLAN:  1. Fever and shortness of breath secondary to community-acquired pneumonia causing mild confusion and generalized weakness. The plan is to admit the patient. We will pan-culture him, we will place him on IV antibiotics, Rocephin and azithromycin, pharmacy to dose, he will be given supplemental oxygen, and he will be given nebulizer treatments p.r.n.  2. Acute renal failure, mildly elevated lactate, likely secondary to dehydration from #1 above. At this time, the patient will get pneumonia treatment as above, IV fluids, we will obtain urine sodium and creatinine, we will hold his ACE inhibitor, and repeat BMP in the morning. If creatinine is not improving, a renal ultrasound should be considered. 3. History of atrial fibrillation. No acute issues. The patient will be on telemonitor, the patient will get IV fluids for dehydration, Digoxin and Lopressor will be continued along with Coumadin with  pharmacy to dose his Coumadin with goal INR of 2 to 3.  4. Dizziness on and off for one month. I think most likely offending agent is community-acquired pneumonia causing dehydration, the patient has had negative MRI as an outpatient during the last week, CT of brain today is negative, he has no focal neurological deficits, and I do not think that this is a TIA, however, the family and the ER physician expressed the concern that the patient could be having recurrent TIAs, which I doubt, we will get echogram, carotid duplex, and we will also get orthostatic vital signs, and neurology to evaluate in the morning. I do not think the patient warrants EEG, tilt table testing, etc. The patient wishes cardiology to see the patient. The patient's cardiologist, Dr. Nehemiah Massed, will be requested to see the patient during this admission.  5. DVT prophylaxis. Coumadin. INR is supratherapeutic at this time. Pharmacy will monitor Coumadin dose.  6. The patient is FULL CODE.   Time 40 mins ____________________________ Margaree Mackintosh. Candiss Norse, MD pks:slb D: 08/31/2011 01:52:49 ET T: 08/31/2011 09:20:39 ET JOB#: [id number]  cc: Deno Etienne K. Candiss Norse, MD, <Dictator> Kirstie Peri. Caryn Section, MD Margaree Mackintosh [facility] MD ELECTRONICALLY SIGNED 08/31/2011 10:58

## 2014-09-17 DIAGNOSIS — Z0000 Encounter for general adult medical examination without abnormal findings: Secondary | ICD-10-CM | POA: Diagnosis not present

## 2014-09-17 DIAGNOSIS — Z125 Encounter for screening for malignant neoplasm of prostate: Secondary | ICD-10-CM | POA: Diagnosis not present

## 2014-09-17 DIAGNOSIS — E785 Hyperlipidemia, unspecified: Secondary | ICD-10-CM | POA: Diagnosis not present

## 2014-09-17 DIAGNOSIS — G2581 Restless legs syndrome: Secondary | ICD-10-CM | POA: Diagnosis not present

## 2014-09-17 DIAGNOSIS — I2510 Atherosclerotic heart disease of native coronary artery without angina pectoris: Secondary | ICD-10-CM | POA: Diagnosis not present

## 2014-09-17 DIAGNOSIS — I4891 Unspecified atrial fibrillation: Secondary | ICD-10-CM | POA: Diagnosis not present

## 2014-09-17 DIAGNOSIS — N183 Chronic kidney disease, stage 3 (moderate): Secondary | ICD-10-CM | POA: Diagnosis not present

## 2014-09-17 LAB — BASIC METABOLIC PANEL
BUN: 26 mg/dL — AB (ref 4–21)
Creatinine: 1.6 mg/dL — AB (ref <=1.3)
Sodium: 143 mmol/L (ref 137–147)

## 2014-09-17 LAB — LIPID PANEL
Cholesterol: 108 mg/dL (ref 0–200)
HDL: 45 mg/dL (ref 35–70)
LDL Cholesterol: 39 mg/dL
Triglycerides: 122 mg/dL (ref 40–160)

## 2014-09-17 LAB — PSA: PSA: 0.4

## 2014-11-12 ENCOUNTER — Other Ambulatory Visit: Payer: Self-pay | Admitting: Family Medicine

## 2014-11-12 DIAGNOSIS — M549 Dorsalgia, unspecified: Secondary | ICD-10-CM

## 2014-11-12 MED ORDER — OXYCODONE HCL 15 MG PO TABS: 15.0000 mg | ORAL_TABLET | ORAL | Status: DC | PRN

## 2014-11-12 NOTE — Telephone Encounter (Signed)
Pt contacted office for refill request on the following medications:  Oxycodone 15mg .  [id number]

## 2014-11-14 ENCOUNTER — Telehealth: Payer: Self-pay | Admitting: Family Medicine

## 2014-11-14 ENCOUNTER — Telehealth: Payer: Self-pay

## 2014-11-14 NOTE — Telephone Encounter (Signed)
Patient notified

## 2014-11-14 NOTE — Telephone Encounter (Signed)
Advised patient that per Dr. Caryn Section, he is to take half of the Benazepril that he has at home.

## 2014-11-14 NOTE — Telephone Encounter (Signed)
Patient reports that he was instructed to take half of Benazepril. However, he reports that he was told to take 10mg , but he has already been on 10mg  tablet. Patient wants to know does he take a total of 5mg ? Per [employer] we have the patient taking 20mg  daily. Please advise. Thanks!

## 2014-11-14 NOTE — Telephone Encounter (Signed)
He should reduce benazepril to 10mg  a day but breaking 20mg  tablets in half.

## 2014-11-14 NOTE — Telephone Encounter (Signed)
Pt calls in today saying his blood pressure has been running very low for the last three days.  (80/40's)  He reports feeling okay, but does get lightheaded when he stands up.  He is wanting to know if he should stop his blood pressure medicines.   Thanks,   -Mickel Baas

## 2014-12-05 DIAGNOSIS — I10 Essential (primary) hypertension: Secondary | ICD-10-CM | POA: Diagnosis not present

## 2014-12-05 DIAGNOSIS — I482 Chronic atrial fibrillation: Secondary | ICD-10-CM | POA: Diagnosis not present

## 2014-12-05 DIAGNOSIS — I25810 Atherosclerosis of coronary artery bypass graft(s) without angina pectoris: Secondary | ICD-10-CM | POA: Diagnosis not present

## 2014-12-05 DIAGNOSIS — E782 Mixed hyperlipidemia: Secondary | ICD-10-CM | POA: Diagnosis not present

## 2014-12-06 ENCOUNTER — Telehealth: Payer: Self-pay | Admitting: Family Medicine

## 2014-12-06 DIAGNOSIS — M549 Dorsalgia, unspecified: Secondary | ICD-10-CM

## 2014-12-06 NOTE — Telephone Encounter (Signed)
Pt contacted office for refill request on the following medications:  oxyCODONE (ROXICODONE) 15 MG.  JS#[id number]    This is a of Dr Caryn Section pt.

## 2014-12-06 NOTE — Telephone Encounter (Signed)
Last Refill was 11/12/2014  Thanks,   -Mickel Baas

## 2014-12-07 MED ORDER — OXYCODONE HCL 15 MG PO TABS: 15.0000 mg | ORAL_TABLET | ORAL | Status: DC | PRN

## 2014-12-07 NOTE — Telephone Encounter (Signed)
Advised patient that prescription is ready.

## 2014-12-07 NOTE — Telephone Encounter (Signed)
Pt called to see if Rx is ready to pick up.  Pt states he is going out of town and would like to get this today/MW

## 2014-12-07 NOTE — Telephone Encounter (Signed)
Printed.  Please notify patient. Thanks.  

## 2014-12-11 DIAGNOSIS — H26492 Other secondary cataract, left eye: Secondary | ICD-10-CM | POA: Diagnosis not present

## 2014-12-11 DIAGNOSIS — Z961 Presence of intraocular lens: Secondary | ICD-10-CM | POA: Diagnosis not present

## 2015-01-02 ENCOUNTER — Other Ambulatory Visit: Payer: Self-pay | Admitting: Family Medicine

## 2015-01-02 DIAGNOSIS — M549 Dorsalgia, unspecified: Secondary | ICD-10-CM

## 2015-01-02 NOTE — Telephone Encounter (Signed)
Pt contacted office for refill request on the following medications: oxyCODONE (ROXICODONE) 15 MG immediate release tablet Thanks TNP

## 2015-01-04 ENCOUNTER — Other Ambulatory Visit: Payer: Self-pay

## 2015-01-04 DIAGNOSIS — M549 Dorsalgia, unspecified: Secondary | ICD-10-CM

## 2015-01-04 MED ORDER — OXYCODONE HCL 15 MG PO TABS: 15.0000 mg | ORAL_TABLET | ORAL | Status: DC | PRN

## 2015-01-04 NOTE — Telephone Encounter (Signed)
thsi was sent to Dr.Fisher on august 10th and it is still not filled pt will run out Sunday, please review. Thank you-aa

## 2015-01-07 MED ORDER — OXYCODONE HCL 15 MG PO TABS: 15.0000 mg | ORAL_TABLET | ORAL | Status: DC | PRN

## 2015-01-14 ENCOUNTER — Other Ambulatory Visit: Payer: Self-pay | Admitting: Family Medicine

## 2015-01-17 ENCOUNTER — Encounter: Payer: Self-pay | Admitting: Emergency Medicine

## 2015-01-17 ENCOUNTER — Emergency Department: Payer: Medicare Other

## 2015-01-17 ENCOUNTER — Observation Stay
Admission: EM | Admit: 2015-01-17 | Discharge: 2015-01-18 | Disposition: A | Discharge status: Home / Self Care | Payer: Medicare Other | Attending: Internal Medicine | Admitting: Internal Medicine

## 2015-01-17 ENCOUNTER — Observation Stay: Payer: Medicare Other

## 2015-01-17 DIAGNOSIS — R9401 Abnormal electroencephalogram [EEG]: Secondary | ICD-10-CM | POA: Insufficient documentation

## 2015-01-17 DIAGNOSIS — R4182 Altered mental status, unspecified: Secondary | ICD-10-CM

## 2015-01-17 DIAGNOSIS — M51369 Other intervertebral disc degeneration, lumbar region without mention of lumbar back pain or lower extremity pain: Secondary | ICD-10-CM

## 2015-01-17 DIAGNOSIS — G2581 Restless legs syndrome: Secondary | ICD-10-CM | POA: Insufficient documentation

## 2015-01-17 DIAGNOSIS — G4733 Obstructive sleep apnea (adult) (pediatric): Secondary | ICD-10-CM | POA: Insufficient documentation

## 2015-01-17 DIAGNOSIS — I130 Hypertensive heart and chronic kidney disease with heart failure and stage 1 through stage 4 chronic kidney disease, or unspecified chronic kidney disease: Secondary | ICD-10-CM | POA: Diagnosis not present

## 2015-01-17 DIAGNOSIS — Z8249 Family history of ischemic heart disease and other diseases of the circulatory system: Secondary | ICD-10-CM | POA: Insufficient documentation

## 2015-01-17 DIAGNOSIS — J189 Pneumonia, unspecified organism: Secondary | ICD-10-CM | POA: Diagnosis present

## 2015-01-17 DIAGNOSIS — I517 Cardiomegaly: Secondary | ICD-10-CM | POA: Insufficient documentation

## 2015-01-17 DIAGNOSIS — I4510 Unspecified right bundle-branch block: Secondary | ICD-10-CM | POA: Diagnosis not present

## 2015-01-17 DIAGNOSIS — M5136 Other intervertebral disc degeneration, lumbar region: Secondary | ICD-10-CM

## 2015-01-17 DIAGNOSIS — Z9114 Patient's other noncompliance with medication regimen: Secondary | ICD-10-CM | POA: Diagnosis not present

## 2015-01-17 DIAGNOSIS — R739 Hyperglycemia, unspecified: Secondary | ICD-10-CM | POA: Insufficient documentation

## 2015-01-17 DIAGNOSIS — R0989 Other specified symptoms and signs involving the circulatory and respiratory systems: Secondary | ICD-10-CM | POA: Diagnosis not present

## 2015-01-17 DIAGNOSIS — I4891 Unspecified atrial fibrillation: Secondary | ICD-10-CM | POA: Diagnosis not present

## 2015-01-17 DIAGNOSIS — F918 Other conduct disorders: Secondary | ICD-10-CM | POA: Diagnosis not present

## 2015-01-17 DIAGNOSIS — R569 Unspecified convulsions: Secondary | ICD-10-CM | POA: Diagnosis not present

## 2015-01-17 DIAGNOSIS — Z951 Presence of aortocoronary bypass graft: Secondary | ICD-10-CM | POA: Insufficient documentation

## 2015-01-17 DIAGNOSIS — G40909 Epilepsy, unspecified, not intractable, without status epilepticus: Principal | ICD-10-CM | POA: Insufficient documentation

## 2015-01-17 DIAGNOSIS — I509 Heart failure, unspecified: Secondary | ICD-10-CM | POA: Diagnosis not present

## 2015-01-17 DIAGNOSIS — R401 Stupor: Secondary | ICD-10-CM | POA: Diagnosis not present

## 2015-01-17 DIAGNOSIS — E785 Hyperlipidemia, unspecified: Secondary | ICD-10-CM | POA: Diagnosis not present

## 2015-01-17 DIAGNOSIS — R4189 Other symptoms and signs involving cognitive functions and awareness: Secondary | ICD-10-CM | POA: Diagnosis present

## 2015-01-17 DIAGNOSIS — M549 Dorsalgia, unspecified: Secondary | ICD-10-CM | POA: Insufficient documentation

## 2015-01-17 DIAGNOSIS — N189 Chronic kidney disease, unspecified: Secondary | ICD-10-CM | POA: Diagnosis not present

## 2015-01-17 DIAGNOSIS — Z7901 Long term (current) use of anticoagulants: Secondary | ICD-10-CM | POA: Insufficient documentation

## 2015-01-17 DIAGNOSIS — R4020 Unspecified coma: Secondary | ICD-10-CM | POA: Diagnosis not present

## 2015-01-17 DIAGNOSIS — Z9842 Cataract extraction status, left eye: Secondary | ICD-10-CM | POA: Diagnosis not present

## 2015-01-17 DIAGNOSIS — M7989 Other specified soft tissue disorders: Secondary | ICD-10-CM

## 2015-01-17 DIAGNOSIS — R0689 Other abnormalities of breathing: Secondary | ICD-10-CM | POA: Diagnosis not present

## 2015-01-17 DIAGNOSIS — Z9841 Cataract extraction status, right eye: Secondary | ICD-10-CM | POA: Diagnosis not present

## 2015-01-17 DIAGNOSIS — Z87891 Personal history of nicotine dependence: Secondary | ICD-10-CM | POA: Diagnosis not present

## 2015-01-17 DIAGNOSIS — Z79899 Other long term (current) drug therapy: Secondary | ICD-10-CM | POA: Diagnosis not present

## 2015-01-17 DIAGNOSIS — E876 Hypokalemia: Secondary | ICD-10-CM | POA: Insufficient documentation

## 2015-01-17 DIAGNOSIS — I10 Essential (primary) hypertension: Secondary | ICD-10-CM

## 2015-01-17 HISTORY — DX: Essential (primary) hypertension: I10

## 2015-01-17 LAB — CBC
HCT: 36.1 % — ABNORMAL LOW (ref 40.0–52.0)
Hemoglobin: 12.2 g/dL — ABNORMAL LOW (ref 13.0–18.0)
MCH: 31.2 pg (ref 26.0–34.0)
MCHC: 33.9 g/dL (ref 32.0–36.0)
MCV: 91.8 fL (ref 80.0–100.0)
Platelets: 184 10*3/uL (ref 150–440)
RBC: 3.93 MIL/uL — ABNORMAL LOW (ref 4.40–5.90)
RDW: 15.9 % — ABNORMAL HIGH (ref 11.5–14.5)
WBC: 9.9 10*3/uL (ref 3.8–10.6)

## 2015-01-17 LAB — URINALYSIS COMPLETE WITH MICROSCOPIC (ARMC ONLY)
Bacteria, UA: NONE SEEN
Bilirubin Urine: NEGATIVE
Glucose, UA: NEGATIVE mg/dL
Ketones, ur: NEGATIVE mg/dL
Leukocytes, UA: NEGATIVE
Nitrite: NEGATIVE
Protein, ur: 30 mg/dL — AB
Specific Gravity, Urine: 1.012 (ref 1.005–1.030)
Squamous Epithelial / LPF: NONE SEEN
pH: 8 (ref 5.0–8.0)

## 2015-01-17 LAB — COMPREHENSIVE METABOLIC PANEL
ALT: 13 U/L — ABNORMAL LOW (ref 17–63)
AST: 31 U/L (ref 15–41)
Albumin: 3.9 g/dL (ref 3.5–5.0)
Alkaline Phosphatase: 44 U/L (ref 38–126)
Anion gap: 12 (ref 5–15)
BUN: 36 mg/dL — ABNORMAL HIGH (ref 6–20)
CO2: 27 mmol/L (ref 22–32)
Calcium: 8.8 mg/dL — ABNORMAL LOW (ref 8.9–10.3)
Chloride: 99 mmol/L — ABNORMAL LOW (ref 101–111)
Creatinine, Ser: 1.6 mg/dL — ABNORMAL HIGH (ref 0.61–1.24)
GFR calc Af Amer: 46 mL/min — ABNORMAL LOW (ref >60)
GFR calc non Af Amer: 40 mL/min — ABNORMAL LOW (ref >60)
Glucose, Bld: 152 mg/dL — ABNORMAL HIGH (ref 65–99)
Potassium: 3 mmol/L — ABNORMAL LOW (ref 3.5–5.1)
Sodium: 138 mmol/L (ref 135–145)
Total Bilirubin: 0.4 mg/dL (ref 0.3–1.2)
Total Protein: 6.9 g/dL (ref 6.5–8.1)

## 2015-01-17 LAB — HEMOGLOBIN A1C: Hgb A1c MFr Bld: 6.2 % — ABNORMAL HIGH (ref 4.0–6.0)

## 2015-01-17 LAB — URINE DRUG SCREEN, QUALITATIVE (ARMC ONLY)
Amphetamines, Ur Screen: NOT DETECTED
Barbiturates, Ur Screen: NOT DETECTED
Benzodiazepine, Ur Scrn: NOT DETECTED
Cannabinoid 50 Ng, Ur ~~LOC~~: NOT DETECTED
Cocaine Metabolite,Ur ~~LOC~~: NOT DETECTED
MDMA (Ecstasy)Ur Screen: NOT DETECTED
Methadone Scn, Ur: NOT DETECTED
Opiate, Ur Screen: NOT DETECTED
Phencyclidine (PCP) Ur S: NOT DETECTED
Tricyclic, Ur Screen: NOT DETECTED

## 2015-01-17 LAB — AMMONIA: Ammonia: 21 umol/L (ref 9–35)

## 2015-01-17 LAB — TROPONIN I: Troponin I: <0.03 ng/mL (ref <0.031)

## 2015-01-17 LAB — LACTIC ACID, PLASMA: Lactic Acid, Venous: 1.6 mmol/L (ref 0.5–2.0)

## 2015-01-17 LAB — MAGNESIUM: Magnesium: 1.7 mg/dL (ref 1.7–2.4)

## 2015-01-17 LAB — DIGOXIN LEVEL: Digoxin Level: 0.2 ng/mL — ABNORMAL LOW (ref 0.8–2.0)

## 2015-01-17 MED ORDER — SODIUM CHLORIDE 0.9 % IJ SOLN
3.0000 mL | Freq: Two times a day (BID) | INTRAMUSCULAR | Status: DC
  Administered 2015-01-17: 3 mL via INTRAVENOUS

## 2015-01-17 MED ORDER — METOLAZONE 5 MG PO TABS
5.0000 mg | ORAL_TABLET | Freq: Every day | ORAL | Status: DC
  Filled 2015-01-17: qty 1

## 2015-01-17 MED ORDER — POTASSIUM CHLORIDE 10 MEQ/100ML IV SOLN
10.0000 meq | INTRAVENOUS | Status: AC
  Filled 2015-01-17 (×2): qty 100

## 2015-01-17 MED ORDER — LORAZEPAM 2 MG/ML IJ SOLN
1.0000 mg | Freq: Once | INTRAMUSCULAR | Status: AC
  Administered 2015-01-17: 1 mg via INTRAVENOUS

## 2015-01-17 MED ORDER — ROSUVASTATIN CALCIUM 20 MG PO TABS
40.0000 mg | ORAL_TABLET | Freq: Every day | ORAL | Status: DC
  Filled 2015-01-17: qty 1

## 2015-01-17 MED ORDER — PRAMIPEXOLE DIHYDROCHLORIDE 1 MG PO TABS
1.5000 mg | ORAL_TABLET | Freq: Every day | ORAL | Status: DC
  Administered 2015-01-17: 1.5 mg via ORAL
  Filled 2015-01-17 (×2): qty 2
  Filled 2015-01-17: qty 1

## 2015-01-17 MED ORDER — DIPHENHYDRAMINE HCL 50 MG/ML IJ SOLN
50.0000 mg | Freq: Once | INTRAMUSCULAR | Status: AC
  Administered 2015-01-17: 50 mg via INTRAVENOUS

## 2015-01-17 MED ORDER — TORSEMIDE 20 MG PO TABS: 20.0000 mg | ORAL_TABLET | Freq: Every day | ORAL | Status: DC

## 2015-01-17 MED ORDER — DIPHENHYDRAMINE HCL 50 MG/ML IJ SOLN
INTRAMUSCULAR | Status: AC
  Filled 2015-01-17: qty 1

## 2015-01-17 MED ORDER — ACETAMINOPHEN 650 MG RE SUPP: 650.0000 mg | Freq: Four times a day (QID) | RECTAL | Status: DC | PRN

## 2015-01-17 MED ORDER — RIVAROXABAN 15 MG PO TABS: 15.0000 mg | ORAL_TABLET | Freq: Every day | ORAL | Status: DC

## 2015-01-17 MED ORDER — POTASSIUM CHLORIDE CRYS ER 20 MEQ PO TBCR: 20.0000 meq | EXTENDED_RELEASE_TABLET | Freq: Every day | ORAL | Status: DC

## 2015-01-17 MED ORDER — OXCARBAZEPINE 150 MG PO TABS
150.0000 mg | ORAL_TABLET | Freq: Two times a day (BID) | ORAL | Status: DC
  Administered 2015-01-17: 150 mg via ORAL
  Filled 2015-01-17 (×3): qty 1

## 2015-01-17 MED ORDER — LORAZEPAM 2 MG/ML IJ SOLN: 2.0000 mg | INTRAMUSCULAR | Status: DC | PRN

## 2015-01-17 MED ORDER — ACETAMINOPHEN 325 MG PO TABS
650.0000 mg | ORAL_TABLET | Freq: Four times a day (QID) | ORAL | Status: DC | PRN
  Administered 2015-01-18: 03:00:00 650 mg via ORAL
  Filled 2015-01-17: qty 2

## 2015-01-17 MED ORDER — POTASSIUM CHLORIDE 20 MEQ PO PACK
20.0000 meq | PACK | Freq: Once | ORAL | Status: AC
  Administered 2015-01-17: 20:00:00 20 meq via ORAL
  Filled 2015-01-17: qty 1

## 2015-01-17 MED ORDER — RIVAROXABAN 15 MG PO TABS
15.0000 mg | ORAL_TABLET | Freq: Every day | ORAL | Status: DC
  Administered 2015-01-17: 15 mg via ORAL
  Filled 2015-01-17: qty 1

## 2015-01-17 MED ORDER — DEXTROSE 5 % IV SOLN
500.0000 mg | Freq: Once | INTRAVENOUS | Status: AC
  Administered 2015-01-17: 500 mg via INTRAVENOUS
  Filled 2015-01-17: qty 500

## 2015-01-17 MED ORDER — LORAZEPAM 2 MG/ML IJ SOLN
INTRAMUSCULAR | Status: AC
  Administered 2015-01-17: 1 mg via INTRAVENOUS
  Filled 2015-01-17: qty 1

## 2015-01-17 MED ORDER — HALOPERIDOL LACTATE 5 MG/ML IJ SOLN
5.0000 mg | Freq: Once | INTRAMUSCULAR | Status: AC
  Administered 2015-01-17: 5 mg via INTRAVENOUS

## 2015-01-17 MED ORDER — POTASSIUM CHLORIDE 20 MEQ PO PACK: 20.0000 meq | PACK | Freq: Two times a day (BID) | ORAL | Status: DC

## 2015-01-17 MED ORDER — DEXTROSE 5 % IV SOLN
1.0000 g | Freq: Once | INTRAVENOUS | Status: AC
  Administered 2015-01-17: 1 g via INTRAVENOUS
  Filled 2015-01-17: qty 10

## 2015-01-17 MED ORDER — DIGOXIN 125 MCG PO TABS: 125.0000 ug | ORAL_TABLET | Freq: Every day | ORAL | Status: DC

## 2015-01-17 MED ORDER — HALOPERIDOL LACTATE 5 MG/ML IJ SOLN
INTRAMUSCULAR | Status: AC
  Filled 2015-01-17: qty 1

## 2015-01-17 NOTE — Progress Notes (Signed)
Neuro assessment done upon arrival, RN noted fixed pupils and lethargy noted. Pt only oriented to self. Hand grips equal, but unable to to keep legs elevated for more then 5 seconds. Dr. Clayton Bibles notified and ordered a MRI and EEG. Wife at bedside. Dr.Smith notified of consult.

## 2015-01-17 NOTE — Consult Note (Signed)
Reason for Consult: unresponsiveness Referring Physician: Dr. Wetzel Bjornstad is an 79 y.o. male.  HPI:  Seen at request of Dr. Ether Griffins for seizures;  79 yo RHD M who presents with multiple episodes of unresponsiveness.  Pt denies these episodes and seems to be very irritated that I am asking.  There are no family members available now.  Pt denies tongue biting or incontinence.  Pt states that he is not sick and that he slept ok for the past few nights.  Past Medical History  Diagnosis Date  . Hypertension     Past Surgical History  Procedure Laterality Date  . Cardiac surgery      History reviewed. No pertinent family history.  Social History:  reports that he has quit smoking. He does not have any smokeless tobacco history on file. He reports that he does not drink alcohol. His drug history is not on file.  Allergies: No Known Allergies  Medications: personally reviewed by me  Results for orders placed or performed during the hospital encounter of 01/17/15 (from the past 48 hour(s))  CBC     Status: Abnormal   Collection Time: 01/17/15  5:54 AM  Result Value Ref Range   WBC 9.9 3.8 - 10.6 K/uL   RBC 3.93 (L) 4.40 - 5.90 MIL/uL   Hemoglobin 12.2 (L) 13.0 - 18.0 g/dL   HCT 36.1 (L) 40.0 - 52.0 %   MCV 91.8 80.0 - 100.0 fL   MCH 31.2 26.0 - 34.0 pg   MCHC 33.9 32.0 - 36.0 g/dL   RDW 15.9 (H) 11.5 - 14.5 %   Platelets 184 150 - 440 K/uL  Comprehensive metabolic panel     Status: Abnormal   Collection Time: 01/17/15  5:54 AM  Result Value Ref Range   Sodium 138 135 - 145 mmol/L   Potassium 3.0 (L) 3.5 - 5.1 mmol/L   Chloride 99 (L) 101 - 111 mmol/L   CO2 27 22 - 32 mmol/L   Glucose, Bld 152 (H) 65 - 99 mg/dL   BUN 36 (H) 6 - 20 mg/dL   Creatinine, Ser 1.60 (H) 0.61 - 1.24 mg/dL   Calcium 8.8 (L) 8.9 - 10.3 mg/dL   Total Protein 6.9 6.5 - 8.1 g/dL   Albumin 3.9 3.5 - 5.0 g/dL   AST 31 15 - 41 U/L   ALT 13 (L) 17 - 63 U/L   Alkaline Phosphatase 44 38 - 126 U/L    Total Bilirubin 0.4 0.3 - 1.2 mg/dL   GFR calc non Af Amer 40 (L) >60 mL/min   GFR calc Af Amer 46 (L) >60 mL/min    Comment: (NOTE) The eGFR has been calculated using the CKD EPI equation. This calculation has not been validated in all clinical situations. eGFR's persistently <60 mL/min signify possible Chronic Kidney Disease.    Anion gap 12 5 - 15  Troponin I     Status: None   Collection Time: 01/17/15  5:54 AM  Result Value Ref Range   Troponin I <0.03 <0.031 ng/mL    Comment:        NO INDICATION OF MYOCARDIAL INJURY.   Digoxin level     Status: Abnormal   Collection Time: 01/17/15  5:54 AM  Result Value Ref Range   Digoxin Level 0.2 (L) 0.8 - 2.0 ng/mL  Hemoglobin A1c     Status: Abnormal   Collection Time: 01/17/15  5:54 AM  Result Value Ref Range   Hgb  A1c MFr Bld 6.2 (H) 4.0 - 6.0 %  Ammonia     Status: None   Collection Time: 01/17/15  5:56 AM  Result Value Ref Range   Ammonia 21 9 - 35 umol/L  Magnesium     Status: None   Collection Time: 01/17/15  5:56 AM  Result Value Ref Range   Magnesium 1.7 1.7 - 2.4 mg/dL  Urine Drug Screen, Qualitative ([facility] only)     Status: None   Collection Time: 01/17/15  8:04 AM  Result Value Ref Range   Tricyclic, Ur Screen NONE DETECTED NONE DETECTED   Amphetamines, Ur Screen NONE DETECTED NONE DETECTED   MDMA (Ecstasy)Ur Screen NONE DETECTED NONE DETECTED   Cocaine Metabolite,Ur [place] NONE DETECTED NONE DETECTED   Opiate, Ur Screen NONE DETECTED NONE DETECTED   Phencyclidine (PCP) Ur S NONE DETECTED NONE DETECTED   Cannabinoid 50 Ng, Ur [place] NONE DETECTED NONE DETECTED   Barbiturates, Ur Screen NONE DETECTED NONE DETECTED   Benzodiazepine, Ur Scrn NONE DETECTED NONE DETECTED   Methadone Scn, Ur NONE DETECTED NONE DETECTED    Comment: (NOTE) 132  Tricyclics, urine               Cutoff 1000 ng/mL 200  Amphetamines, urine             Cutoff 1000 ng/mL 300  MDMA (Ecstasy), urine           Cutoff 500 ng/mL 400  Cocaine  Metabolite, urine       Cutoff 300 ng/mL 500  Opiate, urine                   Cutoff 300 ng/mL 600  Phencyclidine (PCP), urine      Cutoff 25 ng/mL 700  Cannabinoid, urine              Cutoff 50 ng/mL 800  Barbiturates, urine             Cutoff 200 ng/mL 900  Benzodiazepine, urine           Cutoff 200 ng/mL 1000 Methadone, urine                Cutoff 300 ng/mL 1100 1200 The urine drug screen provides only a preliminary, unconfirmed 1300 analytical test result and should not be used for non-medical 1400 purposes. Clinical consideration and professional judgment should 1500 be applied to any positive drug screen result due to possible 1600 interfering substances. A more specific alternate chemical method 1700 must be used in order to obtain a confirmed analytical result.  1800 Gas chromato graphy / mass spectrometry (GC/MS) is the preferred 1900 confirmatory method.   Urinalysis complete, with microscopic     Status: Abnormal   Collection Time: 01/17/15  8:04 AM  Result Value Ref Range   Color, Urine STRAW (A) YELLOW   APPearance CLEAR (A) CLEAR   Glucose, UA NEGATIVE NEGATIVE mg/dL   Bilirubin Urine NEGATIVE NEGATIVE   Ketones, ur NEGATIVE NEGATIVE mg/dL   Specific Gravity, Urine 1.012 1.005 - 1.030   Hgb urine dipstick 2+ (A) NEGATIVE   pH 8.0 5.0 - 8.0   Protein, ur 30 (A) NEGATIVE mg/dL   Nitrite NEGATIVE NEGATIVE   Leukocytes, UA NEGATIVE NEGATIVE   RBC / HPF 6-30 0 - 5 RBC/hpf   WBC, UA 0-5 0 - 5 WBC/hpf   Bacteria, UA NONE SEEN NONE SEEN   Squamous Epithelial / LPF NONE SEEN NONE SEEN  Lactic acid, plasma  Status: None   Collection Time: 01/17/15 11:30 AM  Result Value Ref Range   Lactic Acid, Venous 1.6 0.5 - 2.0 mmol/L    Ct Head Wo Contrast  01/17/2015   CLINICAL DATA:  Altered mental status  EXAM: CT HEAD WITHOUT CONTRAST  TECHNIQUE: Contiguous axial images were obtained from the base of the skull through the vertex without intravenous contrast.  COMPARISON:  CT  06/30/2014  FINDINGS: Moderate atrophy without hydrocephalus. This is stable. Negative for acute infarct. Negative for hemorrhage or mass.  Calvarium intact.  IMPRESSION: Generalized atrophy. No acute abnormality and no change from the prior study.   Electronically Signed   By: Franchot Gallo M.D.   On: 01/17/2015 08:54   Mr Brain Wo Contrast  01/17/2015   CLINICAL DATA:  Unresponsive episode  EXAM: MRI HEAD WITHOUT CONTRAST  TECHNIQUE: Multiplanar, multiecho pulse sequences of the brain and surrounding structures were obtained without intravenous contrast.  COMPARISON:  08/18/2011  FINDINGS: Calvarium and upper cervical spine: No focal marrow signal abnormality.  Orbits: Bilateral cataract resection.  Sinuses and Mastoids: Clear. Mastoid and middle ears are clear.  Brain: No acute abnormality such as acute infarct, hemorrhage, hydrocephalus, or mass lesion. No evidence of large vessel occlusion.  There is mild for age generalized cortical atrophy which is stable from 2013. Mild small vessel ischemic changes, expected for age.  No cortical findings to explain seizure.  IMPRESSION: No acute finding or change from 2013.   Electronically Signed   By: Monte Fantasia M.D.   On: 01/17/2015 16:56   Dg Chest Port 1 View  01/17/2015   CLINICAL DATA:  Found unresponsive.  Now with altered mental status.  EXAM: PORTABLE CHEST - 1 VIEW  COMPARISON:  Radiographs [id number]  FINDINGS: Sternotomy wires overlie enlarged cardiac silhouette. There is central venous pulmonary congestion increased compared to prior. No overt pulmonary edema. There is mild obscuration of the LEFT heart border medially and superiorly. No pneumothorax.  IMPRESSION: 1. Cardiomegaly and central venous congestion. 2. New obscuration of the LEFT heart border may relate to venous congestion but cannot exclude lingular pneumonia.   Electronically Signed   By: Suzy Bouchard M.D.   On: 01/17/2015 08:01    Review of Systems  Constitutional: Negative.    HENT: Negative.   Eyes: Negative.   Respiratory: Negative.   Cardiovascular: Negative.   Gastrointestinal: Negative.   Genitourinary: Negative.   Musculoskeletal: Negative.   Skin: Negative.   Neurological: Negative.    Blood pressure 128/59, pulse 80, temperature 97.6 F (36.4 C), temperature source Oral, resp. rate 20, height 5' 10"  (1.778 m), weight 89.359 kg (197 lb), SpO2 97 %. Physical Exam  Nursing note and vitals reviewed. Constitutional: He appears well-developed and well-nourished. He appears distressed.  HENT:  Head: Normocephalic and atraumatic.  Nose: Nose normal.  Mouth/Throat: Oropharynx is clear and moist.  Eyes: Conjunctivae and EOM are normal. Pupils are equal, round, and reactive to light. No scleral icterus.  Neck: Normal range of motion. Neck supple.  Cardiovascular: Normal rate, regular rhythm, normal heart sounds and intact distal pulses.   No murmur heard. Respiratory: Effort normal and breath sounds normal. No respiratory distress. He has no wheezes.  GI: Soft. Bowel sounds are normal.  Musculoskeletal: Normal range of motion.  Neurological: He is alert.  Alert and oriented x 2 not time, nl speech and language PERRLA, EOMI, nl VF, face symmetric, tongue midline 5/5 B, no tremor, nl tone 1+/4 B, mute plantars Light touch  and temp intact   Skin: He is diaphoretic.  Psychiatric: His affect is angry. He is agitated.   CT of head personally reviewed by me and shows moderate atrophy and mild white matter changes.  Assessment/Plan: 1.  Probable seizure- no family to give hx but is sounds like multiple episodes per chart.  Unusual for multiple episodes to be provoked. 2.  White matter changes-  Asymptomatic 3.  Fever-  Unknown origin if not pneumonia -  LP if pt looks worse tomorrow or continues fever -  Start Trileptal 136m BID x 1 week then 3058mBID -  MRI pending -  No driving or operating heavy machinery x 6 months -  Will follow  Dimond Crotty,  Najib Colmenares 01/17/2015, 10:08 PM

## 2015-01-17 NOTE — ED Notes (Signed)
MD at the bedside for pt re-evaluation. Pt on stretcher with side rails up for safety. Very restless and trying to sit up and states "ive gotta get out of here".

## 2015-01-17 NOTE — ED Notes (Signed)
Pt presents to ED by [facility] after he was found by his wife unresponsive with snoring respirations. 911 called and fire dept administered narcan. Pt immediately became responsive and restless. Pt incontinent of stool while in ambulance. Not able to answer questions at this time and does not follow simple commands. No increased work of breathing or distress  noted at this time.

## 2015-01-17 NOTE — ED Notes (Signed)
Admitting at bedside 

## 2015-01-17 NOTE — ED Provider Notes (Signed)
[facility] Emergency Department Provider Note  ____________________________________________  Time seen:   I have reviewed the triage vital signs and the nursing notes.  History and physical exam limited second to altered mental status.  HISTORY  Chief Complaint Altered Mental Status      HPI Vincent Mason is a 79 y.o. male presents via EMS. Per EMS patient had snoring respirations and pinpoint pupils on their presentation patient was given Narcan by firefighters with resultant combative behavior & defecation with repetitive yawning. Patient's wife states that the patient may have taken too much oxycodone which she has done in the past.     No past medical history on file.  Patient Active Problem List   Diagnosis Date Noted  . Back pain 11/12/2014    No past surgical history on file.  Current Outpatient Rx  Name  Route  Sig  Dispense  Refill  . oxyCODONE (ROXICODONE) 15 MG immediate release tablet   Oral   Take 1 tablet (15 mg total) by mouth every 4 (four) hours as needed for pain.   180 tablet   0   . oxyCODONE (ROXICODONE) 15 MG immediate release tablet   Oral   Take 1 tablet (15 mg total) by mouth every 4 (four) hours as needed for pain.   180 tablet   0   . XARELTO 15 MG TABS tablet      TAKE ONE TABLET BY MOUTH DAILY WITH EVENING MEAL   30 tablet   12     PT NEEDREFILLS     Allergies Unknown No family history on file.  Social History Social History  Substance Use Topics  . Smoking status: Not on file  . Smokeless tobacco: Not on file  . Alcohol Use: Not on file    Review of Systems  Constitutional: Negative for fever. Eyes: Negative for visual changes. ENT: Negative for sore throat. Cardiovascular: Negative for chest pain. Respiratory: Negative for shortness of breath. Gastrointestinal: Negative for abdominal pain, vomiting and diarrhea. Genitourinary: Negative for dysuria. Musculoskeletal: Negative for back  pain. Skin: Negative for rash. Neurological: Negative for headaches, focal weakness or numbness.   10-point ROS otherwise negative.  ____________________________________________   PHYSICAL EXAM:  VITAL SIGNS: ED Triage Vitals  Enc Vitals Group     BP 01/17/15 0552 105/76 mmHg     Pulse Rate 01/17/15 0552 90     Resp --      Temp --      Temp src --      SpO2 01/17/15 0552 100 %     Weight 01/17/15 0552 197 lb (89.359 kg)     Height --      Head Cir --      Peak Flow --      Pain Score --      Pain Loc --      Pain Edu? --      Excl. in [place]? --      Constitutional: Alert, combative yawning covered in fecal material  Eyes: Conjunctivae are normal. PERRL. Normal extraocular movements. ENT   Head: Normocephalic and atraumatic.   Nose: No congestion/rhinnorhea.   Mouth/Throat: Mucous membranes are moist.   Neck: No stridor. Cardiovascular: Normal rate, regular rhythm. Normal and symmetric distal pulses are present in all extremities. No murmurs, rubs, or gallops. Respiratory: Normal respiratory effort without tachypnea nor retractions. Breath sounds are clear and equal bilaterally. No wheezes/rales/rhonchi. Gastrointestinal: Soft and nontender. No distention. There is no CVA tenderness.  Genitourinary: deferred Musculoskeletal: Nontender with normal range of motion in all extremities. No joint effusions.  No lower extremity tenderness nor edema. Neurologic:  Normal speech and language. No gross focal neurologic deficits are appreciated. Speech is normal.  Skin:  Skin is warm, dry and intact. No rash noted. Psychiatric: combative agitated ____________________________________________    LABS (pertinent positives/negatives)  Labs Reviewed  CBC - Abnormal; Notable for the following:    RBC 3.93 (*)    Hemoglobin 12.2 (*)    HCT 36.1 (*)    RDW 15.9 (*)    All other components within normal limits  COMPREHENSIVE METABOLIC PANEL - Abnormal; Notable for the  following:    Potassium 3.0 (*)    Chloride 99 (*)    Glucose, Bld 152 (*)    BUN 36 (*)    Creatinine, Ser 1.60 (*)    Calcium 8.8 (*)    ALT 13 (*)    GFR calc non Af Amer 40 (*)    GFR calc Af Amer 46 (*)    All other components within normal limits  URINALYSIS COMPLETEWITH MICROSCOPIC ([facility] ONLY) - Abnormal; Notable for the following:    Color, Urine STRAW (*)    APPearance CLEAR (*)    Hgb urine dipstick 2+ (*)    Protein, ur 30 (*)    All other components within normal limits  DIGOXIN LEVEL - Abnormal; Notable for the following:    Digoxin Level 0.2 (*)    All other components within normal limits  PROLACTIN - Abnormal; Notable for the following:    Prolactin 2.3 (*)    All other components within normal limits  HEMOGLOBIN A1C - Abnormal; Notable for the following:    Hgb A1c MFr Bld 6.2 (*)    All other components within normal limits  COMPREHENSIVE METABOLIC PANEL - Abnormal; Notable for the following:    Potassium 3.1 (*)    Glucose, Bld 115 (*)    BUN 26 (*)    Creatinine, Ser 1.34 (*)    AST 48 (*)    ALT 16 (*)    GFR calc non Af Amer 49 (*)    GFR calc Af Amer 57 (*)    All other components within normal limits  CBC - Abnormal; Notable for the following:    WBC 14.3 (*)    RBC 4.31 (*)    HCT 39.6 (*)    RDW 15.8 (*)    All other components within normal limits  CULTURE, BLOOD (ROUTINE X 2)  CULTURE, BLOOD (ROUTINE X 2)  AMMONIA  URINE DRUG SCREEN, QUALITATIVE ([facility] ONLY)  TROPONIN I  LACTIC ACID, PLASMA  MAGNESIUM     ____________________________________________   EKG  ED ECG REPORT I, Thong Feeny, [place] N, the attending physician, personally viewed and interpreted this ECG.   Date: 01/19/2015  EKG Time: 6 :17 a.m.  Rate: 93  Rhythm: Normal sinus rhythm  Axis: None  Intervals: Normal  ST&T Change: None   ____________________________________________    RADIOLOGY MR Brain Wo Contrast (Final result) Result time: 01/17/15 16:56:22    Final result by Rad Results In Interface (01/17/15 16:56:22)   Narrative:   CLINICAL DATA: Unresponsive episode  EXAM: MRI HEAD WITHOUT CONTRAST  TECHNIQUE: Multiplanar, multiecho pulse sequences of the brain and surrounding structures were obtained without intravenous contrast.  COMPARISON: 08/18/2011  FINDINGS: Calvarium and upper cervical spine: No focal marrow signal abnormality.  Orbits: Bilateral cataract resection.  Sinuses and Mastoids: Clear. Mastoid and middle  ears are clear.  Brain: No acute abnormality such as acute infarct, hemorrhage, hydrocephalus, or mass lesion. No evidence of large vessel occlusion.  There is mild for age generalized cortical atrophy which is stable from 2013. Mild small vessel ischemic changes, expected for age.  No cortical findings to explain seizure.  IMPRESSION: No acute finding or change from 2013.   Electronically Signed By: Monte Fantasia M.D. On: 01/17/2015 16:56          CT Head Wo Contrast (Final result) Result time: 01/17/15 08:54:52   Final result by Rad Results In Interface (01/17/15 08:54:52)   Narrative:   CLINICAL DATA: Altered mental status  EXAM: CT HEAD WITHOUT CONTRAST  TECHNIQUE: Contiguous axial images were obtained from the base of the skull through the vertex without intravenous contrast.  COMPARISON: CT 06/30/2014  FINDINGS: Moderate atrophy without hydrocephalus. This is stable. Negative for acute infarct. Negative for hemorrhage or mass.  Calvarium intact.  IMPRESSION: Generalized atrophy. No acute abnormality and no change from the prior study.   Electronically Signed By: Franchot Gallo M.D. On: 01/17/2015 08:54          DG Chest Port 1 View (Final result) Result time: 01/17/15 08:01:45   Final result by Rad Results In Interface (01/17/15 08:01:45)   Narrative:   CLINICAL DATA: Found unresponsive. Now with altered mental status.  EXAM: PORTABLE  CHEST - 1 VIEW  COMPARISON: Radiographs [id number]  FINDINGS: Sternotomy wires overlie enlarged cardiac silhouette. There is central venous pulmonary congestion increased compared to prior. No overt pulmonary edema. There is mild obscuration of the LEFT heart border medially and superiorly. No pneumothorax.  IMPRESSION: 1. Cardiomegaly and central venous congestion. 2. New obscuration of the LEFT heart border may relate to venous congestion but cannot exclude lingular pneumonia.   Electronically Signed By: Suzy Bouchard M.D. On: 01/17/2015 08:01      Critical care: 30 minutes   INITIAL IMPRESSION / ASSESSMENT AND PLAN / ED COURSE  Pertinent labs & imaging results that were available during my care of the patient were reviewed by me and considered in my medical decision making (see chart for details).  I spoke with the patient's wife who was concerned that the patient takes his oxycodone more than recommended. Patient's wife states that in the past when she has voice any concern regarding his oxycodone he becomes very angry and as such she has stopped doing so. Patient's wife states that he has a tendency of getting up at night and taking additional oxycodone's.  ____________________________________________   FINAL CLINICAL IMPRESSION(S) / ED DIAGNOSES  Final diagnoses:  Altered mental status, unspecified altered mental status type  Lingular pneumonia      Gregor Hams, MD 01/19/15 [phone]

## 2015-01-17 NOTE — ED Provider Notes (Signed)
[facility]  I accepted care from Dr. Owens Shark ____________________________________________    LABS (pertinent positives/negatives)  Urine drug screen negative Urinalysis 6-30 red blood cells, without sign of urinary tract infection Ammonia 21 CBC shows white blood cell count of 9.9, hemoglobin 12.2. Differential is pending. Complete metabolic panel significant for BUN 36 and creatinine 1.60, potassium 3.0 and chloride 99 with glucose 152 Troponin less than 0.03  ____________________________________________    RADIOLOGY All xrays were viewed by me. Imaging interpreted by radiologist.  CT head noncontrast: Generalized atrophy. No acute abnormalities and no change from prior study  Chest x-ray portable: IMPRESSION: 1. Cardiomegaly and central venous congestion. 2. New obscuration of the LEFT heart border may relate to venous congestion but cannot exclude lingular pneumonia.  ____________________________________________   PROCEDURES  Procedure(s) performed: None  Critical Care performed: None  ____________________________________________   INITIAL IMPRESSION / ASSESSMENT AND PLAN / ED COURSE  CONSULTATIONS: Phone consultation with hospitalist for admission.  Pertinent labs & imaging results that were available during my care of the patient were reviewed by me and considered in my medical decision making (see chart for details).  Per Dr. Owens Shark the patient's clinical scenario seemed likely to have been related to narcotic overdose, unintentional, as this is happened before. The patient reportedly did wake up with Narcan, however was so uncontrollable and unable to participate with further medical evaluation, he was given sedative medications to proceed with his evaluation safely.  Chest x-ray came back showing possible pneumonia, and although the white blood cell count is within normal, the patient's rectal temperature is mildly elevated and so I am  going to treat for community acquired pneumonia with Rocephin and azithromycin after blood cultures. He is not having other signs of sepsis including no tachycardia, and no hypotension.  His kidney function is slightly decreased, however it is at his baseline.   His CT scan showed no sign of emergency condition responsible for the altered mental status episode.  Patient needs hospitalization/observation to make sure his mental status returns to normal and is stable.   Patient / Family / Caregiver informed of clinical course, medical decision-making process, and agree with plan.    ____________________________________________   FINAL CLINICAL IMPRESSION(S) / ED DIAGNOSES  Final diagnoses:  Altered mental status, unspecified altered mental status type  Lingular pneumonia        Lisa Roca, MD 01/17/15 [phone]

## 2015-01-17 NOTE — ED Notes (Signed)
Medic at bedside to assure that pt does not try and get up out of bed. Yellow socks placed on pt. Wife at bedside. Will notify CT when pt is stable enough for exam. Cont to monitor.

## 2015-01-17 NOTE — Plan of Care (Signed)
Problem: Discharge Progression Outcomes Goal: Tolerating diet Outcome: Not Applicable Date Met:  28/24/17 Npo. Goal: Activity appropriate for discharge plan Outcome: Not Progressing Bedrest  At present Goal: Other Discharge Outcomes/Goals Outcome: Not Progressing Pt from home. Lives with wife.no assistive devices needed  for ambulation. Pt usually alert and  Responsive.

## 2015-01-17 NOTE — Plan of Care (Signed)
Picked up pt care at 15:00.  Pt was very lethargic, non-responsive.  Pt's pupils non-reactive. But before he went down for MRI was more awake.  MRI negative.  No acute finding or change from 2013.  Came back from MRI more alert - diet advanced to Soft- pt ate entire plate, but continues to be lethargic.  Pupils more reactive.

## 2015-01-17 NOTE — H&P (Signed)
Vincent Mason at [place] NAME: Vincent Mason    MR#:  [id number]  DATE OF BIRTH:  07/24/1935  DATE OF ADMISSION:  01/17/2015  PRIMARY CARE PHYSICIAN: Lelon Huh, MD   REQUESTING/REFERRING PHYSICIAN:   CHIEF COMPLAINT:   Chief Complaint  Patient presents with  . Altered Mental Status    HISTORY OF PRESENT ILLNESS: Vincent Mason  is a 79 y.o. male with a known history of obstructive sleep apnea who is noncompliant with his CPAP, who presents to the hospital with recurrent episodes of unresponsiveness when the patient's wife was present during my interview, she was awakened at around 4:30 AM when patient was thrashing in the bed. He was hauling and breathing heavily. She tried to wake him up, however, could not was unresponsive. She called EMS and approximately 15 minutes later, patient was able to wake up. He was able to interact with EMS staff and he was brought to emergency room for further evaluation.  In the emergency room, the patient was noted to have a little elevated temperature above 100 rectally, his chest x-ray was concerning for possible lingula atelectasis versus pneumonia. However, patient's white blood cell count was normal. Upon discussion this patient who is very somnolent and not able to provide a good review of systems he denies any cough, phlegm production or fevers.   PAST MEDICAL HISTORY:   Past Medical History  Diagnosis Date  . Hypertension    past medical history is also significant for congestive heart failure, obstructive sleep apnea, noncompliance with CPAP, atrial fibrillation for which she is followed by Dr. Nehemiah Massed, hypertension, hyperlipidemia, restless leg syndrome, which is poorly controlled, low extremity swelling. Degenerative disc disease and lumbar spine  PAST SURGICAL HISTORY:  Past Surgical History  Procedure Laterality Date  . Cardiac surgery     quadruple bypass surgery in 1990s  SOCIAL HISTORY:   Social History  Substance Use Topics  . Smoking status: Former Research scientist (life sciences)  . Smokeless tobacco: Not on file  . Alcohol Use: No    FAMILY HISTORY: Coronary artery disease in elderly age. Patient's both parents died in their 59s  DRUG ALLERGIES: No Known Allergies  Review of Systems  Unable to perform ROS: mental status change    MEDICATIONS AT HOME:  Prior to Admission medications   Medication Sig Start Date End Date Taking? Authorizing Provider  [place] 125 MCG tablet Take 1 tablet by mouth daily. 12/31/14  Yes Historical Provider, MD  metolazone (ZAROXOLYN) 5 MG tablet Take 1 tablet by mouth daily. 01/12/15  Yes Historical Provider, MD  oxyCODONE (ROXICODONE) 15 MG immediate release tablet Take 1 tablet (15 mg total) by mouth every 4 (four) hours as needed for pain. 01/04/15  Yes Margarita Rana, MD  potassium chloride SA (K-DUR,KLOR-CON) 20 MEQ tablet Take 1 tablet by mouth daily. 01/08/15  Yes Historical Provider, MD  rOPINIRole (REQUIP) 4 MG tablet Take 1 tablet by mouth daily. 12/27/14  Yes Historical Provider, MD  rosuvastatin (CRESTOR) 40 MG tablet Take 1 tablet by mouth daily. 12/27/14  Yes Historical Provider, MD  sildenafil (REVATIO) 20 MG tablet Take 2.5 tablets by mouth as needed. 01/14/15  Yes Historical Provider, MD  torsemide (DEMADEX) 20 MG tablet Take 1 tablet by mouth daily. 12/31/14  Yes Historical Provider, MD  XARELTO 15 MG TABS tablet TAKE ONE TABLET BY MOUTH DAILY WITH EVENING MEAL 01/14/15  Yes Birdie Sons, MD      PHYSICAL EXAMINATION:   VITAL  SIGNS: Blood pressure 109/69, pulse 77, temperature 100.7 F (38.2 C), temperature source Rectal, resp. rate 18, height 5\' 10"  (1.778 m), weight 89.359 kg (197 lb), SpO2 97 %.  GENERAL:  79 y.o.-year-old patient lying in the bed with no acute distress. Moving periodically and moaning. He has to move his legs due to discomfort in his lower extremities. Very somnolent and difficult to arouse. However, briefly able to open his eyes and  converse EYES: Pupils equal, round, reactive to light and accommodation. No scleral icterus. Extraocular muscles intact.  HEENT: Head atraumatic, normocephalic. Oropharynx and nasopharynx clear.  NECK:  Supple, no jugular venous distention. No thyroid enlargement, no tenderness.  LUNGS: Normal breath sounds bilaterally, no wheezing, rales, few rhonchi were heard but no crepitation. No use of accessory muscles of respiration.  CARDIOVASCULAR: S1, S2 , irregularly irregular. No murmurs, rubs, or gallops. Relatively tachycardic, 1+ lower extremity edema bilaterally. No calve tenderness, or cyanosis ABDOMEN: Soft, nontender, nondistended. Bowel sounds present. No organomegaly or mass.  EXTREMITIES: No pedal edema, cyanosis, or clubbing.  NEUROLOGIC: Cranial nerves II through XII are intact. Muscle strength , able to move all extremities. Sensation grossly intact. Gait not checked.  PSYCHIATRIC: The patient is somnolent , but oriented x 3 upon waking him up.  SKIN: No obvious rash, lesion, or ulcer, except in lower extremities which have brownish discoloration below his knees and swelling was noted of approximately 1+ bilaterally.   LABORATORY PANEL:   CBC  Recent Labs Lab 01/17/15 0554  WBC 9.9  HGB 12.2*  HCT 36.1*  PLT 184  MCV 91.8  MCH 31.2  MCHC 33.9  RDW 15.9*   ------------------------------------------------------------------------------------------------------------------  Chemistries   Recent Labs Lab 01/17/15 0554  NA 138  K 3.0*  CL 99*  CO2 27  GLUCOSE 152*  BUN 36*  CREATININE 1.60*  CALCIUM 8.8*  AST 31  ALT 13*  ALKPHOS 44  BILITOT 0.4   ------------------------------------------------------------------------------------------------------------------  Cardiac Enzymes  Recent Labs Lab 01/17/15 0554  TROPONINI <0.03   ------------------------------------------------------------------------------------------------------------------  RADIOLOGY: Ct  Head Wo Contrast  01/17/2015   CLINICAL DATA:  Altered mental status  EXAM: CT HEAD WITHOUT CONTRAST  TECHNIQUE: Contiguous axial images were obtained from the base of the skull through the vertex without intravenous contrast.  COMPARISON:  CT 06/30/2014  FINDINGS: Moderate atrophy without hydrocephalus. This is stable. Negative for acute infarct. Negative for hemorrhage or mass.  Calvarium intact.  IMPRESSION: Generalized atrophy. No acute abnormality and no change from the prior study.   Electronically Signed   By: Franchot Gallo M.D.   On: 01/17/2015 08:54   Dg Chest Port 1 View  01/17/2015   CLINICAL DATA:  Found unresponsive.  Now with altered mental status.  EXAM: PORTABLE CHEST - 1 VIEW  COMPARISON:  Radiographs [id number]  FINDINGS: Sternotomy wires overlie enlarged cardiac silhouette. There is central venous pulmonary congestion increased compared to prior. No overt pulmonary edema. There is mild obscuration of the LEFT heart border medially and superiorly. No pneumothorax.  IMPRESSION: 1. Cardiomegaly and central venous congestion. 2. New obscuration of the LEFT heart border may relate to venous congestion but cannot exclude lingular pneumonia.   Electronically Signed   By: Suzy Bouchard M.D.   On: 01/17/2015 08:01    EKG: Orders placed or performed during the hospital encounter of 01/17/15  . EKG 12-Lead  . EKG 12-Lead  . EKG 12-Lead  . EKG 12-Lead   EKG in the emergency room 25th of August 2016, showed  A. fib at 97 bpm, right bundle branch block, nonspecific ST-T changes  IMPRESSION AND PLAN:  Principal Problem:   Unresponsive Active Problems:   Obstructive sleep apnea   CHF (congestive heart failure)   Atrial fibrillation   Restless leg syndrome   Leg swelling   Hypertension   Hyperlipidemia   Degenerative disc disease, lumbar   Unresponsiveness 1. Recurrent episode of unresponsiveness of unclear etiology at this time, rule out seizures, admit patient to medical floor, get  prolactin level. Neurology consultation. Get electroencephalogram 2. A. Fib, continue patient's current medications 3. Lower extremity swelling, likely due to CHF, right-sided. Continue diuretics 4. Renal insufficiency, chronic, follow with therapy 5. Restless leg syndrome, not controlled. Initiate patient on Mirapex at 1.5 mg daily. Advance it as needed 6. Hypokalemia, supplement orally whenever patient is evaluated for swallowing problems 7. Questionable pneumonia reassess patient clinically. Initiate patient on antibiotics if needed 8. Anemia. Get guaiac 9. Hyperglycemia. Check hemoglobin A1c 10. Back pains. Initiate patient on obvious whenever he is more alert. Curious situation. Patient's urine drug screen is negative for opiates, although patient's wife tells me that he takes those medications   All the records are reviewed and case discussed with ED provider. Management plans discussed with the patient, family and they are in agreement.  CODE STATUS: Full code   TOTAL TIME TAKING CARE OF THIS PATIENT: 60 minutes.   Patient's clinical condition, treatment plan, evaluation planning was discussed this patient's wife, all questions were answered. She voiced understanding, additional time spent approximately 10-15 minutes Reighlynn Swiney M.D on 01/17/2015 at 10:16 AM  Between 7am to 6pm - Pager - [phone] After 6pm go to [email or web address] - password [email or web address] [facility]  Office  [phone]  CC: Primary care physician; Lelon Huh, MD

## 2015-01-18 ENCOUNTER — Telehealth: Payer: Self-pay | Admitting: Family Medicine

## 2015-01-18 DIAGNOSIS — I4891 Unspecified atrial fibrillation: Secondary | ICD-10-CM | POA: Diagnosis not present

## 2015-01-18 DIAGNOSIS — G2581 Restless legs syndrome: Secondary | ICD-10-CM | POA: Diagnosis not present

## 2015-01-18 DIAGNOSIS — R569 Unspecified convulsions: Secondary | ICD-10-CM | POA: Diagnosis not present

## 2015-01-18 DIAGNOSIS — J189 Pneumonia, unspecified organism: Secondary | ICD-10-CM

## 2015-01-18 DIAGNOSIS — G40909 Epilepsy, unspecified, not intractable, without status epilepticus: Secondary | ICD-10-CM | POA: Diagnosis not present

## 2015-01-18 DIAGNOSIS — R4189 Other symptoms and signs involving cognitive functions and awareness: Secondary | ICD-10-CM | POA: Diagnosis not present

## 2015-01-18 DIAGNOSIS — M7989 Other specified soft tissue disorders: Secondary | ICD-10-CM | POA: Diagnosis not present

## 2015-01-18 LAB — PROLACTIN: Prolactin: 2.3 ng/mL — ABNORMAL LOW (ref 4.0–15.2)

## 2015-01-18 LAB — COMPREHENSIVE METABOLIC PANEL
ALT: 16 U/L — ABNORMAL LOW (ref 17–63)
AST: 48 U/L — ABNORMAL HIGH (ref 15–41)
Albumin: 3.8 g/dL (ref 3.5–5.0)
Alkaline Phosphatase: 39 U/L (ref 38–126)
Anion gap: 9 (ref 5–15)
BUN: 26 mg/dL — ABNORMAL HIGH (ref 6–20)
CO2: 28 mmol/L (ref 22–32)
Calcium: 9.2 mg/dL (ref 8.9–10.3)
Chloride: 102 mmol/L (ref 101–111)
Creatinine, Ser: 1.34 mg/dL — ABNORMAL HIGH (ref 0.61–1.24)
GFR calc Af Amer: 57 mL/min — ABNORMAL LOW (ref >60)
GFR calc non Af Amer: 49 mL/min — ABNORMAL LOW (ref >60)
Glucose, Bld: 115 mg/dL — ABNORMAL HIGH (ref 65–99)
Potassium: 3.1 mmol/L — ABNORMAL LOW (ref 3.5–5.1)
Sodium: 139 mmol/L (ref 135–145)
Total Bilirubin: 1 mg/dL (ref 0.3–1.2)
Total Protein: 6.9 g/dL (ref 6.5–8.1)

## 2015-01-18 LAB — CBC
HCT: 39.6 % — ABNORMAL LOW (ref 40.0–52.0)
Hemoglobin: 13.1 g/dL (ref 13.0–18.0)
MCH: 30.5 pg (ref 26.0–34.0)
MCHC: 33.2 g/dL (ref 32.0–36.0)
MCV: 91.7 fL (ref 80.0–100.0)
Platelets: 193 10*3/uL (ref 150–440)
RBC: 4.31 MIL/uL — ABNORMAL LOW (ref 4.40–5.90)
RDW: 15.8 % — ABNORMAL HIGH (ref 11.5–14.5)
WBC: 14.3 10*3/uL — ABNORMAL HIGH (ref 3.8–10.6)

## 2015-01-18 MED ORDER — OXCARBAZEPINE 300 MG PO TABS: 300.0000 mg | ORAL_TABLET | Freq: Two times a day (BID) | ORAL | Status: DC

## 2015-01-18 MED ORDER — OXYCODONE HCL 5 MG PO TABS: 15.0000 mg | ORAL_TABLET | ORAL | Status: DC | PRN

## 2015-01-18 MED ORDER — LEVOFLOXACIN IN D5W 750 MG/150ML IV SOLN
750.0000 mg | INTRAVENOUS | Status: DC
  Filled 2015-01-18: qty 150

## 2015-01-18 MED ORDER — OXCARBAZEPINE 150 MG PO TABS: 150.0000 mg | ORAL_TABLET | Freq: Two times a day (BID) | ORAL | Status: DC

## 2015-01-18 MED ORDER — AMOXICILLIN-POT CLAVULANATE 875-125 MG PO TABS: 1.0000 | ORAL_TABLET | Freq: Two times a day (BID) | ORAL | Status: DC

## 2015-01-18 NOTE — Plan of Care (Signed)
Problem: Discharge Progression Outcomes Goal: Other Discharge Outcomes/Goals Outcome: Progressing Pt disoriented to situation. Pt less lethargic during the night. Pt with c/o headache and lower leg pain. MD notified and pt's home dose of pain medication was ordered as inpt. VSS. Afebrile. Pt's family does not want pt to be discharged home this weekend. Pt's wife will be having surgery at [facility]

## 2015-01-18 NOTE — Progress Notes (Signed)
MD making rounds. Discharge orders received. IV removed. Prescriptions E-Scripted. Discharge paperwork provided, explained, signed and witnessed. No unanswered questions. Escorted via wheelchair by nursing staff. All belongings sent with patient and family.

## 2015-01-18 NOTE — Progress Notes (Signed)
Pharmacy Note - Levofloxacin Renal Dose Adjustment  No Known Allergies  Estimated Creatinine Clearance: 43.5 mL/min (by C-G formula based on Cr of 1.6).  Patient with orders for levofloxacin 750mg  IV Q24H for CAP. Will adjust for renal function to levofloxacin 750mg  IV Q48H. Will continue to follow and adjust per policy.  Rexene Edison, PharmD Clinical Pharmacist  01/18/2015 7:30 AM

## 2015-01-18 NOTE — Discharge Summary (Addendum)
[facility] [place] NAME: Vincent Mason    MR#:  [id number]  DATE OF BIRTH:  1935/07/08  DATE OF ADMISSION:  01/17/2015 ADMITTING PHYSICIAN: Theodoro Grist, MD  DATE OF DISCHARGE: 01/18/2015 10:50 AM  PRIMARY CARE PHYSICIAN: Lelon Huh, MD     ADMISSION DIAGNOSIS:  Lingular pneumonia [J18.9] Altered mental status, unspecified altered mental status type [R41.82]  DISCHARGE DIAGNOSIS:  Principal Problem:   Seizures Active Problems:   Obstructive sleep apnea   CHF (congestive heart failure)   Atrial fibrillation   Restless leg syndrome   Leg swelling   Hypertension   Hyperlipidemia   Degenerative disc disease, lumbar   Unresponsiveness   CAP (community acquired pneumonia)   SECONDARY DIAGNOSIS:   Past Medical History  Diagnosis Date  . Hypertension     .pro HOSPITAL COURSE:  Mr. Vincent Mason is a 79 year old Caucasian male with history of obstructive sleep apnea , noncompliant with CPAP, who presents to the hospital with recurrent episodes of unresponsiveness. No reported tongue biting or incontinence. On arrival to the hospital he was still poorly responsive,  somnolent and groggy, although he was able to follow some commands. This states lasted for a while for at least few hours. Over time, patient's condition improved and he returned back to normal mental and physical functioning. Patient was seen by neurologist, Dr. Valora Corporal who felt that patient's episodes of unresponsiveness are likely seizures. MRI of brain was performed and it was unremarkable. Patient underwent an electroencephalogram which was abnormal, but unfortunately report is not available yet.  Dr. Valora Corporal recommended patient to stop driving for at least 6 months and initiated patient on Trileptal. He recommended  patient to start Trileptal at 150 mg twice daily dose for 7 days, then 300 mg twice daily dose. He recommended also to stay away flow from  fluoroquinolones due to these medications lowering seizure threshold. As patient's condition improved. He was ready to be discharged home.  Discussion by problem 1. Seizures,  electroencephalogram was abnormal, according to Dr. Valora Corporal, although report is pending. MRI of brain did not show significant changes. Appreciate neurology consultation. Patient is status be started on 77 Trileptal at 150 mg twice daily dose for 7 days, then 300 mg twice daily dose. Patient was advised not to drive. He is to follow-up with Dr. Valora Corporal as outpatient. The patient voiced understanding and agreement.  2. A. Fib, continue patient's current medications, heart rate is controlled 3. Lower extremity swelling, likely due to CHF, right-sided. Continue diuretics as previously prescribed 4. Renal insufficiency, chronic, stable with therapy 5. Restless leg syndrome, not controlled. Patient is to continue Requip, he is to follow-up with Dr. Valora Corporal for further recommendations as his restless leg syndrome is not controlled 6. Hypokalemia, supplemented orally  7. Questionable pneumonia , questionable aspiration pneumonitis . She had some fever to 100.7 rectally 25th of August 2016, but no fevers since initiated on antibiotic, the patient is relatively asymptomatic at present, we will be discharging him on  Augmentin orally. Prescription will be sent to the pharmacy. Patient is to continue antibiotics for 10 day course  8. Anemia. guaiac ordered, not obtained 9. Hyperglycemia. hemoglobin A1c, not obtained 10. Back pains. Initiate patient on his usual medications upon discharge  DISCHARGE CONDITIONS:   Stable  CONSULTS OBTAINED:  Treatment Team:  Valora Corporal, MD  DRUG ALLERGIES:  No Known Allergies  DISCHARGE MEDICATIONS:   Discharge Medication List as of 01/18/2015 10:40  AM    START taking these medications   Details  !! OXcarbazepine (TRILEPTAL) 150 MG tablet Take 1 tablet (150 mg total) by  mouth 2 (two) times daily., Starting 01/18/2015, Until Discontinued, Normal    !! Oxcarbazepine (TRILEPTAL) 300 MG tablet Take 1 tablet (300 mg total) by mouth 2 (two) times daily., Starting 01/18/2015, Until Discontinued, Normal     !! - Potential duplicate medications found. Please discuss with provider.    CONTINUE these medications which have NOT CHANGED   Details  [place] 125 MCG tablet Take 1 tablet by mouth daily., Starting 12/31/2014, Until Discontinued, Historical Med    metolazone (ZAROXOLYN) 5 MG tablet Take 1 tablet by mouth daily., Starting 01/12/2015, Until Discontinued, Historical Med    oxyCODONE (ROXICODONE) 15 MG immediate release tablet Take 1 tablet (15 mg total) by mouth every 4 (four) hours as needed for pain., Starting 01/04/2015, Until Discontinued, Print    potassium chloride SA (K-DUR,KLOR-CON) 20 MEQ tablet Take 1 tablet by mouth daily., Starting 01/08/2015, Until Discontinued, Historical Med    rOPINIRole (REQUIP) 4 MG tablet Take 1 tablet by mouth daily., Starting 12/27/2014, Until Discontinued, Historical Med    rosuvastatin (CRESTOR) 40 MG tablet Take 1 tablet by mouth daily., Starting 12/27/2014, Until Discontinued, Historical Med    sildenafil (REVATIO) 20 MG tablet Take 2.5 tablets by mouth as needed., Starting 01/14/2015, Until Discontinued, Historical Med    torsemide (DEMADEX) 20 MG tablet Take 1 tablet by mouth daily., Starting 12/31/2014, Until Discontinued, Historical Med    XARELTO 15 MG TABS tablet TAKE ONE TABLET BY MOUTH DAILY WITH EVENING MEAL, Normal         DISCHARGE INSTRUCTIONS:    Patient is to follow-up with Dr. Caryn Section in 2-3 days after discharge, Dr. Valora Corporal in 1 week after discharge  If you experience worsening of your admission symptoms, develop shortness of breath, life threatening emergency, suicidal or homicidal thoughts you must seek medical attention immediately by calling 911 or calling your MD immediately  if symptoms less  severe.  You Must read complete instructions/literature along with all the possible adverse reactions/side effects for all the Medicines you take and that have been prescribed to you. Take any new Medicines after you have completely understood and accept all the possible adverse reactions/side effects.   Please note  You were cared for by a hospitalist during your hospital stay. If you have any questions about your discharge medications or the care you received while you were in the hospital after you are discharged, you can call the unit and asked to speak with the hospitalist on call if the hospitalist that took care of you is not available. Once you are discharged, your primary care physician will handle any further medical issues. Please note that NO REFILLS for any discharge medications will be authorized once you are discharged, as it is imperative that you return to your primary care physician (or establish a relationship with a primary care physician if you do not have one) for your aftercare needs so that they can reassess your need for medications and monitor your lab values.    Today   CHIEF COMPLAINT:   Chief Complaint  Patient presents with  . Altered Mental Status    HISTORY OF PRESENT ILLNESS:  Vincent Mason  is a 79 y.o. male with a known history of obstructive sleep apnea , noncompliant with CPAP, who presents to the hospital with recurrent episodes of unresponsiveness. No reported tongue biting or incontinence.  On arrival to the hospital he was still poorly responsive,  somnolent and groggy, although he was able to follow some commands. This states lasted for a while for at least few hours. Over time, patient's condition improved and he returned back to normal mental and physical functioning. Patient was seen by neurologist, Dr. Valora Corporal who felt that patient's episodes of unresponsiveness are likely seizures. MRI of brain was performed and it was unremarkable. Patient  underwent an electroencephalogram which was abnormal, but unfortunately report is not available yet.  Dr. Valora Corporal recommended patient to stop driving for at least 6 months and initiated patient on Trileptal. He recommended  patient to start Trileptal at 150 mg twice daily dose for 7 days, then 300 mg twice daily dose. He recommended also to stay away flow from fluoroquinolones due to these medications lowering seizure threshold. As patient's condition improved. He was ready to be discharged home.  Discussion by problem 1. Seizures,  electroencephalogram was abnormal, according to Dr. Valora Corporal, although report is pending. MRI of brain did not show significant changes. Appreciate neurology consultation. Patient is status be started on 77 Trileptal at 150 mg twice daily dose for 7 days, then 300 mg twice daily dose. Patient was advised not to drive. He is to follow-up with Dr. Valora Corporal as outpatient. The patient voiced understanding and agreement.  2. A. Fib, continue patient's current medications, heart rate is controlled 3. Lower extremity swelling, likely due to CHF, right-sided. Continue diuretics as previously prescribed 4. Renal insufficiency, chronic, stable with therapy 5. Restless leg syndrome, not controlled. Patient is to continue Requip, he is to follow-up with Dr. Valora Corporal for further recommendations as his restless leg syndrome is not controlled 6. Hypokalemia, supplemented orally  7. Questionable pneumonia , questionable aspiration pneumonitis . The patient had some fever to 100.7 rectally 25th of August 2016, but no fevers since initiated on antibiotic, the patient is relatively asymptomatic at present, we will be discharging him on Augmentin orally. Prescription will be sent to the pharmacy. Patient is to continue antibiotics for 10 day course 8. Anemia. guaiac ordered, not obtained 9. Hyperglycemia. hemoglobin A1c, not obtained 10. Back pains. Initiate patient on his  usual medications upon discharge    VITAL SIGNS:  Blood pressure 117/63, pulse 83, temperature 98 F (36.7 C), temperature source Oral, resp. rate 20, height 5\' 10"  (1.778 m), weight 89.359 kg (197 lb), SpO2 96 %.  I/O:   Intake/Output Summary (Last 24 hours) at 01/18/15 1359 Last data filed at 01/18/15 0800  Gross per 24 hour  Intake    240 ml  Output    550 ml  Net   -310 ml    PHYSICAL EXAMINATION:  GENERAL:  79 y.o.-year-old patient lying in the bed with no acute distress.  EYES: Pupils equal, round, reactive to light and accommodation. No scleral icterus. Extraocular muscles intact.  HEENT: Head atraumatic, normocephalic. Oropharynx and nasopharynx clear.  NECK:  Supple, no jugular venous distention. No thyroid enlargement, no tenderness.  LUNGS: Normal breath sounds bilaterally, no wheezing, rales,rhonchi or crepitation. No use of accessory muscles of respiration.  CARDIOVASCULAR: S1, S2 normal. No murmurs, rubs, or gallops.  ABDOMEN: Soft, non-tender, non-distended. Bowel sounds present. No organomegaly or mass.  EXTREMITIES: No pedal edema, cyanosis, or clubbing.  NEUROLOGIC: Cranial nerves II through XII are intact. Muscle strength 5/5 in all extremities. Sensation intact. Gait not checked.  PSYCHIATRIC: The patient is alert and oriented x 3.  SKIN: No obvious rash,  lesion, or ulcer.   DATA REVIEW:   CBC  Recent Labs Lab 01/18/15 0637  WBC 14.3*  HGB 13.1  HCT 39.6*  PLT 193    Chemistries   Recent Labs Lab 01/17/15 0556 01/18/15 0637  NA  --  139  K  --  3.1*  CL  --  102  CO2  --  28  GLUCOSE  --  115*  BUN  --  26*  CREATININE  --  1.34*  CALCIUM  --  9.2  MG 1.7  --   AST  --  48*  ALT  --  16*  ALKPHOS  --  39  BILITOT  --  1.0    Cardiac Enzymes  Recent Labs Lab 01/17/15 0554  TROPONINI <0.03    Microbiology Results  Results for orders placed or performed during the hospital encounter of 01/17/15  Culture, blood (routine x 2)      Status: None (Preliminary result)   Collection Time: 01/17/15  9:30 AM  Result Value Ref Range Status   Specimen Description BLOOD  Final   Special Requests BOTTLES DRAWN AEROBIC AND ANAEROBIC [place]  Final   Culture NO GROWTH 1 DAY  Final   Report Status PENDING  Incomplete  Culture, blood (routine x 2)     Status: None (Preliminary result)   Collection Time: 01/17/15  9:50 AM  Result Value Ref Range Status   Specimen Description BLOOD  Final   Special Requests BOTTLES DRAWN AEROBIC AND ANAEROBIC 5CC  Final   Culture NO GROWTH < 24 HOURS  Final   Report Status PENDING  Incomplete    RADIOLOGY:  Ct Head Wo Contrast  01/17/2015   CLINICAL DATA:  Altered mental status  EXAM: CT HEAD WITHOUT CONTRAST  TECHNIQUE: Contiguous axial images were obtained from the base of the skull through the vertex without intravenous contrast.  COMPARISON:  CT 06/30/2014  FINDINGS: Moderate atrophy without hydrocephalus. This is stable. Negative for acute infarct. Negative for hemorrhage or mass.  Calvarium intact.  IMPRESSION: Generalized atrophy. No acute abnormality and no change from the prior study.   Electronically Signed   By: Franchot Gallo M.D.   On: 01/17/2015 08:54   Mr Brain Wo Contrast  01/17/2015   CLINICAL DATA:  Unresponsive episode  EXAM: MRI HEAD WITHOUT CONTRAST  TECHNIQUE: Multiplanar, multiecho pulse sequences of the brain and surrounding structures were obtained without intravenous contrast.  COMPARISON:  08/18/2011  FINDINGS: Calvarium and upper cervical spine: No focal marrow signal abnormality.  Orbits: Bilateral cataract resection.  Sinuses and Mastoids: Clear. Mastoid and middle ears are clear.  Brain: No acute abnormality such as acute infarct, hemorrhage, hydrocephalus, or mass lesion. No evidence of large vessel occlusion.  There is mild for age generalized cortical atrophy which is stable from 2013. Mild small vessel ischemic changes, expected for age.  No cortical findings to explain  seizure.  IMPRESSION: No acute finding or change from 2013.   Electronically Signed   By: Monte Fantasia M.D.   On: 01/17/2015 16:56   Dg Chest Port 1 View  01/17/2015   CLINICAL DATA:  Found unresponsive.  Now with altered mental status.  EXAM: PORTABLE CHEST - 1 VIEW  COMPARISON:  Radiographs [id number]  FINDINGS: Sternotomy wires overlie enlarged cardiac silhouette. There is central venous pulmonary congestion increased compared to prior. No overt pulmonary edema. There is mild obscuration of the LEFT heart border medially and superiorly. No pneumothorax.  IMPRESSION: 1. Cardiomegaly and central  venous congestion. 2. New obscuration of the LEFT heart border may relate to venous congestion but cannot exclude lingular pneumonia.   Electronically Signed   By: Suzy Bouchard M.D.   On: 01/17/2015 08:01    EKG:   Orders placed or performed during the hospital encounter of 01/17/15  . EKG 12-Lead  . EKG 12-Lead  . EKG 12-Lead  . EKG 12-Lead      Management plans discussed with the patient, family and they are in agreement.  CODE STATUS:   TOTAL TIME TAKING CARE OF THIS PATIENT: 45  minutes.   Prolonged discussion with the patient about his medical problems, treatment plan as well as follow-up plan. All questions were answered and he voiced understanding Lorenza Winkleman M.D on 01/18/2015 at 1:59 PM  Between 7am to 6pm - Pager - [phone]  After 6pm go to [email or web address] - password [email or web address] [facility]  Office  [phone]  CC: Primary care physician; Lelon Huh, MD

## 2015-01-18 NOTE — Telephone Encounter (Signed)
Pt is being discharged from [facility] today for seizures.  I have scheduled appt for a follow up/MW

## 2015-01-18 NOTE — Telephone Encounter (Signed)
Please review. Thanks!  

## 2015-01-18 NOTE — Progress Notes (Signed)
NEUROLOGY NOTE  S: Feels good and wants to go home.  Says that his wife is full of it saying he passes out.  ROS: Neg x 8 systems  O: 98.4 156/58 58 18 Thin, NAD Normocephalic, oropharynx clear Supple, no JVD CTA B, no wheezing RRR, no murmurs  Alert and oriented x 3, nl speech and language PERRLA, EOMI, face symmetric 5/5 B, nl tone, no tremor   A/P: 1. Seizure-  Pt had abnormal EEG and this is his second episode which may have been provoked by infection -  Start trileptal 150mg  BID x 1 week then 300mg  BID -  Would treat infection but would stay away from flouroquinolones -  No driving x 6 months -  Will sign off, please call with questions -  F/u with [facility] Neuro in 2 months

## 2015-01-18 NOTE — Discharge Instructions (Signed)
.  convultions

## 2015-01-22 DIAGNOSIS — R5383 Other fatigue: Secondary | ICD-10-CM | POA: Insufficient documentation

## 2015-01-22 DIAGNOSIS — J449 Chronic obstructive pulmonary disease, unspecified: Secondary | ICD-10-CM | POA: Insufficient documentation

## 2015-01-22 DIAGNOSIS — R000 Tachycardia, unspecified: Secondary | ICD-10-CM | POA: Insufficient documentation

## 2015-01-22 DIAGNOSIS — R252 Cramp and spasm: Secondary | ICD-10-CM | POA: Insufficient documentation

## 2015-01-22 DIAGNOSIS — M5020 Other cervical disc displacement, unspecified cervical region: Secondary | ICD-10-CM | POA: Insufficient documentation

## 2015-01-22 DIAGNOSIS — R319 Hematuria, unspecified: Secondary | ICD-10-CM | POA: Insufficient documentation

## 2015-01-22 DIAGNOSIS — N529 Male erectile dysfunction, unspecified: Secondary | ICD-10-CM | POA: Insufficient documentation

## 2015-01-22 DIAGNOSIS — R413 Other amnesia: Secondary | ICD-10-CM | POA: Insufficient documentation

## 2015-01-22 DIAGNOSIS — I2510 Atherosclerotic heart disease of native coronary artery without angina pectoris: Secondary | ICD-10-CM | POA: Insufficient documentation

## 2015-01-22 DIAGNOSIS — I890 Lymphedema, not elsewhere classified: Secondary | ICD-10-CM | POA: Insufficient documentation

## 2015-01-22 DIAGNOSIS — N183 Chronic kidney disease, stage 3 unspecified: Secondary | ICD-10-CM | POA: Insufficient documentation

## 2015-01-22 DIAGNOSIS — R42 Dizziness and giddiness: Secondary | ICD-10-CM | POA: Insufficient documentation

## 2015-01-22 DIAGNOSIS — K5909 Other constipation: Secondary | ICD-10-CM | POA: Insufficient documentation

## 2015-01-22 DIAGNOSIS — I272 Pulmonary hypertension, unspecified: Secondary | ICD-10-CM | POA: Insufficient documentation

## 2015-01-22 DIAGNOSIS — N1832 Chronic kidney disease, stage 3b: Secondary | ICD-10-CM | POA: Insufficient documentation

## 2015-01-22 DIAGNOSIS — N200 Calculus of kidney: Secondary | ICD-10-CM | POA: Insufficient documentation

## 2015-01-22 LAB — CULTURE, BLOOD (ROUTINE X 2)
Culture: NO GROWTH
Culture: NO GROWTH

## 2015-01-23 ENCOUNTER — Inpatient Hospital Stay: Payer: Self-pay | Admitting: Family Medicine

## 2015-01-25 ENCOUNTER — Encounter: Payer: Self-pay | Admitting: Family Medicine

## 2015-01-25 ENCOUNTER — Ambulatory Visit (INDEPENDENT_AMBULATORY_CARE_PROVIDER_SITE_OTHER): Payer: Medicare Other | Admitting: Family Medicine

## 2015-01-25 VITALS — BP 120/62 | HR 66 | Temp 98.1°F | Resp 16 | Wt 197.0 lb

## 2015-01-25 DIAGNOSIS — J189 Pneumonia, unspecified organism: Secondary | ICD-10-CM

## 2015-01-25 DIAGNOSIS — R569 Unspecified convulsions: Secondary | ICD-10-CM

## 2015-01-25 NOTE — Patient Instructions (Signed)
Increase oxcarbazepine to 300mg  twice a day until lyou see Dr. Tamala Julian on 02/08/15

## 2015-01-25 NOTE — Progress Notes (Signed)
Patient: Vincent Mason Male    DOB: Feb 05, 1936   79 y.o.   MRN: [id number] Visit Date: 01/25/2015  Today's Provider: Lelon Huh, MD   Chief Complaint  Patient presents with  . Hospitalization Follow-up  . Seizures    follow- up  . Pneumonia    follow- up   Subjective:    Seizures  Pertinent negatives include no headaches, no speech difficulty, no chest pain, no nausea and no vomiting.  Pneumonia There is no shortness of breath or wheezing. Pertinent negatives include no appetite change, chest pain, fever or headaches.       Follow up Hospitalization  Patient was admitted to [facility] on 01/17/2015 and discharged on 01/18/2015. He was treated for Lingular Pneumonia and Seizures. Treatment for this included starting 10 day course of Augmentin upon discharge for Pneumonia. Patient was also started on Trileptal for Seizures and was advised to follow up with Dr. Valora Corporal 1 week after discharge as an out patient. Patient has an appointment with Dr. Tamala Julian on 02/08/2015 at 10:30am.  He reports good compliance with treatment. He reports this condition is Improved.  ------------------------------------------------------------------------------------ Seizure follow up: Patient reports that he has not had any seizures or has not been found unresponsive since the first episode prior to his hospitalization. Patient is currently taking the 150mg  tablet of Trileptal twice a day. Patient reports good compliance and good tolerance of treatment.   Pneumonia Follow up: Patient has had some shortness of breath. Patient is still taking Augmentin as prescribed.  Patient denies any fevers, chills, wheezing, sweats or body aches.  Allergies  Allergen Reactions  . Decongestant  [Oxymetazoline]    Previous Medications   AMOXICILLIN-CLAVULANATE (AUGMENTIN) 875-125 MG PER TABLET    Take 1 tablet by mouth 2 (two) times daily.   BENAZEPRIL (LOTENSIN) 20 MG TABLET    Take 1 tablet by mouth  daily.   CYCLOBENZAPRINE (FLEXERIL) 5 MG TABLET    Take 1-2 tablets by mouth 3 (three) times daily as needed.   DIAZEPAM (VALIUM) 5 MG TABLET    Take 1 tablet by mouth every 8 (eight) hours.   DIGOX 125 MCG TABLET    Take 1 tablet by mouth daily.   MAGNESIUM HYDROXIDE (MILK OF MAGNESIA) 400 MG/5ML SUSPENSION    Take by mouth. 2 Oral daily   MECLIZINE (ANTIVERT) 25 MG TABLET    Take 1 tablet by mouth every 6 (six) hours as needed. For dizziness   METOLAZONE (ZAROXOLYN) 5 MG TABLET    Take 1 tablet by mouth daily.   METOPROLOL SUCCINATE (TOPROL XL) 100 MG 24 HR TABLET    Take by mouth. 1/2 oral daily   OXCARBAZEPINE (TRILEPTAL) 150 MG TABLET    Take 1 tablet (150 mg total) by mouth 2 (two) times daily.   OXYCODONE (ROXICODONE) 15 MG IMMEDIATE RELEASE TABLET    Take 1 tablet (15 mg total) by mouth every 4 (four) hours as needed for pain.   POTASSIUM CHLORIDE SA (K-DUR,KLOR-CON) 20 MEQ TABLET    Take 1 tablet by mouth daily.   PRAMIPEXOLE (MIRAPEX) 0.125 MG TABLET    Take 1 tablet by mouth daily. 2-3 hours before bedtime for restless leg   RANITIDINE (ZANTAC) 150 MG TABLET    Take 1 tablet by mouth 2 (two) times daily as needed. As needed for stomach burning   ROPINIROLE (REQUIP) 4 MG TABLET    Take 1 tablet by mouth daily.   ROSUVASTATIN (CRESTOR) 40  MG TABLET    Take 1 tablet by mouth daily.   SILDENAFIL (REVATIO) 20 MG TABLET    Take 2.5 tablets by mouth as needed.   TORSEMIDE (DEMADEX) 20 MG TABLET    Take 1 tablet by mouth daily.   XARELTO 15 MG TABS TABLET    TAKE ONE TABLET BY MOUTH DAILY WITH EVENING MEAL   ZOLPIDEM (AMBIEN) 5 MG TABLET    Take 1 tablet by mouth at bedtime.    Review of Systems  Constitutional: Negative for fever, chills, appetite change and fatigue.  Respiratory: Negative for chest tightness, shortness of breath and wheezing.   Cardiovascular: Negative for chest pain and palpitations.  Gastrointestinal: Negative for nausea, vomiting and abdominal pain.  Neurological:  Negative for tremors, seizures, syncope, speech difficulty, weakness, light-headedness, numbness and headaches.    Social History  Substance Use Topics  . Smoking status: Former Research scientist (life sciences)  . Smokeless tobacco: Not on file     Comment: Quit in 1990  . Alcohol Use: No   Objective:   BP 120/62 mmHg  Pulse 66  Temp(Src) 98.1 F (36.7 C) (Oral)  Resp 16  Wt 197 lb (89.359 kg)  SpO2 99%  Physical Exam  General Appearance:    Alert, cooperative, no distress  Eyes:    PERRL, conjunctiva/corneas clear, EOM's intact       Lungs:     Clear to auscultation bilaterally, respirations unlabored  Heart:    Regular rate and rhythm  Neurologic:   Awake, alert, oriented x 3. No apparent focal neurological           defect.   Ext:   2+ Edema.       Assessment & Plan:     1. CAP (community acquired pneumonia) Clinically resolved.   2. Seizures No seizures since hospital discharge. Doing well on Trileptal. Confirmed with pharmacy that they have rx for increase dose of 300mg  twice a day ready to pick up. Follow up with neurology, Dr. Tamala Julian as scheduled on 01-08-15.         Lelon Huh, MD  [facility]

## 2015-01-30 ENCOUNTER — Encounter: Payer: Self-pay | Admitting: Family Medicine

## 2015-01-30 ENCOUNTER — Other Ambulatory Visit: Payer: Self-pay | Admitting: Family Medicine

## 2015-01-30 DIAGNOSIS — M549 Dorsalgia, unspecified: Secondary | ICD-10-CM

## 2015-01-30 MED ORDER — OXYCODONE HCL 15 MG PO TABS: 15.0000 mg | ORAL_TABLET | ORAL | Status: DC | PRN

## 2015-01-30 NOTE — Telephone Encounter (Signed)
Pt contacted office for refill request on the following medications: ° °oxyCODONE (ROXICODONE) 15 MG immediate release tablet ° °CB#[phone]/MW °

## 2015-02-08 DIAGNOSIS — R569 Unspecified convulsions: Secondary | ICD-10-CM | POA: Diagnosis not present

## 2015-02-12 ENCOUNTER — Other Ambulatory Visit: Payer: Self-pay | Admitting: Family Medicine

## 2015-02-25 ENCOUNTER — Telehealth: Payer: Self-pay | Admitting: Family Medicine

## 2015-02-25 NOTE — Telephone Encounter (Signed)
Advised patient as stated below. Patient stated that he cannot get in to see his neurologist until 03/28/2015. Patient is requesting another medication that is like the oxcarbazepine.

## 2015-02-25 NOTE — Telephone Encounter (Signed)
Called pt back. Patient stated that he has been having dizziness and sob since starting medications oxcarbazepine and amoxicillin-clavulanate.

## 2015-02-25 NOTE — Telephone Encounter (Signed)
Oxcarbazepine is supposed to be managed by his neurologist. Has he seen neurologist since getting out of the hospital, if not then he needs referral. If so he needs to call his neurologist about the medication.

## 2015-02-25 NOTE — Telephone Encounter (Signed)
Pt stated that the medication he was given at the ER is causing him to feel dizzy and short of breath at times. Pt would like to discuss possible changes Dr. Caryn Section could make. Pt stated he has been since for a f/u since his ER visit. Please advise. Thanks TNP

## 2015-02-25 NOTE — Telephone Encounter (Signed)
i am not familiar with these because since i am not a neurologist, but he could cut dose back down to 150mg  BID until he sees neurology. Marland Kitchen He really needs to get in with neurologist sooner as it has already been a month since his ER visit.. It shouldn't take 2 months to get it.

## 2015-02-26 NOTE — Telephone Encounter (Signed)
Advised pt as directed below. Pt verbalized fully understanding. Pt was advised to call [facility] if any questions or concerns.   Pt was also advised to call back his neurologist again.   Thanks,

## 2015-02-27 ENCOUNTER — Other Ambulatory Visit: Payer: Self-pay | Admitting: Family Medicine

## 2015-02-27 DIAGNOSIS — M549 Dorsalgia, unspecified: Secondary | ICD-10-CM

## 2015-02-27 MED ORDER — OXYCODONE HCL 15 MG PO TABS: 15.0000 mg | ORAL_TABLET | ORAL | Status: DC | PRN

## 2015-02-27 NOTE — Telephone Encounter (Signed)
Oxycodone was last filled on 01/30/15. Last OV was 01/25/15.

## 2015-02-27 NOTE — Telephone Encounter (Signed)
Per pt's request, he will run out on Saturday.  Call when ready for pick-up.

## 2015-03-26 ENCOUNTER — Other Ambulatory Visit: Payer: Self-pay | Admitting: Family Medicine

## 2015-03-26 DIAGNOSIS — M549 Dorsalgia, unspecified: Secondary | ICD-10-CM

## 2015-03-26 NOTE — Telephone Encounter (Signed)
Pt contacted office for refill request on the following medications: oxyCODONE (ROXICODONE) 15 MG immediate release tablet. Pt stated he will run out later this week. Thanks TNP

## 2015-03-26 NOTE — Telephone Encounter (Signed)
Please review. Thanks!  

## 2015-03-27 MED ORDER — OXYCODONE HCL 15 MG PO TABS: 15.0000 mg | ORAL_TABLET | ORAL | Status: DC | PRN

## 2015-03-30 ENCOUNTER — Other Ambulatory Visit: Payer: Self-pay | Admitting: Family Medicine

## 2015-04-09 DIAGNOSIS — R569 Unspecified convulsions: Secondary | ICD-10-CM | POA: Diagnosis not present

## 2015-04-09 DIAGNOSIS — I482 Chronic atrial fibrillation: Secondary | ICD-10-CM | POA: Diagnosis not present

## 2015-04-09 DIAGNOSIS — I071 Rheumatic tricuspid insufficiency: Secondary | ICD-10-CM | POA: Diagnosis not present

## 2015-04-09 DIAGNOSIS — I25810 Atherosclerosis of coronary artery bypass graft(s) without angina pectoris: Secondary | ICD-10-CM | POA: Diagnosis not present

## 2015-04-09 DIAGNOSIS — R600 Localized edema: Secondary | ICD-10-CM | POA: Diagnosis not present

## 2015-04-16 DIAGNOSIS — R569 Unspecified convulsions: Secondary | ICD-10-CM | POA: Diagnosis not present

## 2015-04-22 ENCOUNTER — Other Ambulatory Visit: Payer: Self-pay | Admitting: Family Medicine

## 2015-04-22 DIAGNOSIS — M549 Dorsalgia, unspecified: Secondary | ICD-10-CM

## 2015-04-22 NOTE — Telephone Encounter (Signed)
Pt is requesting a refill oxyCODONE (ROXICODONE) 15 MG immediate release tablet.  States it is early but he is calling to make sure he gets it in time.

## 2015-04-24 MED ORDER — OXYCODONE HCL 15 MG PO TABS: 15.0000 mg | ORAL_TABLET | ORAL | Status: DC | PRN

## 2015-04-30 ENCOUNTER — Other Ambulatory Visit: Payer: Self-pay | Admitting: Family Medicine

## 2015-05-24 ENCOUNTER — Other Ambulatory Visit: Payer: Self-pay | Admitting: Family Medicine

## 2015-05-24 DIAGNOSIS — M549 Dorsalgia, unspecified: Secondary | ICD-10-CM

## 2015-05-24 MED ORDER — OXYCODONE HCL 15 MG PO TABS: 15.0000 mg | ORAL_TABLET | ORAL | Status: DC | PRN

## 2015-05-24 NOTE — Telephone Encounter (Signed)
Pt contacted office for refill request on the following medications:  oxyCODONE (ROXICODONE) 15 MG immediate release tablet.  [id number]  This a Dr Caryn Section pt.  Pt will be out by the weekend/MW

## 2015-05-24 NOTE — Telephone Encounter (Signed)
Last ov 01/25/15

## 2015-06-19 ENCOUNTER — Other Ambulatory Visit: Payer: Self-pay

## 2015-06-19 DIAGNOSIS — M549 Dorsalgia, unspecified: Secondary | ICD-10-CM

## 2015-06-19 MED ORDER — OXYCODONE HCL 15 MG PO TABS: 15.0000 mg | ORAL_TABLET | ORAL | Status: DC | PRN

## 2015-06-19 NOTE — Telephone Encounter (Signed)
Patient is requesting a refill on Oxycodone. [id number].

## 2015-06-19 NOTE — Telephone Encounter (Signed)
Last ov 01/25/15.

## 2015-06-21 DIAGNOSIS — Z79899 Other long term (current) drug therapy: Secondary | ICD-10-CM | POA: Diagnosis not present

## 2015-06-28 ENCOUNTER — Telehealth: Payer: Self-pay | Admitting: Family Medicine

## 2015-06-28 ENCOUNTER — Other Ambulatory Visit: Payer: Self-pay | Admitting: Family Medicine

## 2015-06-28 NOTE — Telephone Encounter (Signed)
Please advise patient we received copy of labs from [facility] and potassium level is very low at 2.9. If he hasn't heard otherwise from [facility] he needs to double his potassium tablets and recheck renal panel in 2 weeks.

## 2015-06-28 NOTE — Telephone Encounter (Signed)
Patient was notified by [facility] and is taking double potassium tablets.

## 2015-07-08 ENCOUNTER — Encounter: Payer: Self-pay | Admitting: Family Medicine

## 2015-07-08 ENCOUNTER — Ambulatory Visit (INDEPENDENT_AMBULATORY_CARE_PROVIDER_SITE_OTHER): Payer: Medicare Other | Admitting: Family Medicine

## 2015-07-08 VITALS — BP 120/58 | HR 65 | Temp 98.0°F | Resp 20 | Wt 199.0 lb

## 2015-07-08 DIAGNOSIS — R42 Dizziness and giddiness: Secondary | ICD-10-CM | POA: Diagnosis not present

## 2015-07-08 DIAGNOSIS — M10071 Idiopathic gout, right ankle and foot: Secondary | ICD-10-CM | POA: Diagnosis not present

## 2015-07-08 MED ORDER — MECLIZINE HCL 25 MG PO TABS: 25.0000 mg | ORAL_TABLET | Freq: Three times a day (TID) | ORAL | Status: DC | PRN

## 2015-07-08 MED ORDER — CEPHALEXIN 500 MG PO CAPS: 500.0000 mg | ORAL_CAPSULE | Freq: Four times a day (QID) | ORAL | Status: AC

## 2015-07-08 MED ORDER — COLCHICINE 0.6 MG PO TABS: ORAL_TABLET | ORAL | Status: DC

## 2015-07-08 NOTE — Progress Notes (Signed)
Patient: Vincent Mason Male    DOB: February 20, 1936   80 y.o.   MRN: [id number] Visit Date: 07/08/2015  Today's Provider: Lelon Huh, MD   Chief Complaint  Patient presents with  . Foot Swelling   Subjective:    HPI  Foot  swelling pain: Patient comes in today complaining of swelling and pain in both feet, ankles, and lower legs. Symptoms started 2 weeks ago.Pain is aggravated when moving his toes. Patient states the pain is like an achy sensation. Of note is that his cardiologist checked potassium last month which was 2.7, and he has since increased his potassium supplement to three a day. He also state he occasionally feels off balance and requests something to help with dizziness.    Wt Readings from Last 3 Encounters:  07/08/15 199 lb (90.266 kg)  01/25/15 197 lb (89.359 kg)  09/17/14 206 lb (93.441 kg)       Allergies  Allergen Reactions  . Decongestant  [Oxymetazoline]    Previous Medications   BENAZEPRIL (LOTENSIN) 20 MG TABLET    Take 1 tablet by mouth daily. Reported on 07/08/2015   COENZYME Q10 (COQ10) 200 MG CAPS    Take 1 capsule by mouth daily.   DIAZEPAM (VALIUM) 5 MG TABLET    Take 1 tablet by mouth every 8 (eight) hours. Reported on 07/08/2015   DIGOX 125 MCG TABLET    Take 1 tablet by mouth daily.   MECLIZINE (ANTIVERT) 25 MG TABLET    Take 1 tablet by mouth every 6 (six) hours as needed. Reported on 07/08/2015   METOLAZONE (ZAROXOLYN) 5 MG TABLET    Take 1 tablet by mouth daily.   METOPROLOL SUCCINATE (TOPROL-XL) 100 MG 24 HR TABLET    TAKE ONE-HALF TABLET DAILY   OXCARBAZEPINE (TRILEPTAL) 150 MG TABLET    Take 1 tablet (150 mg total) by mouth 2 (two) times daily.   OXCARBAZEPINE (TRILEPTAL) 300 MG TABLET    Take 1 tablet (300 mg total) by mouth 2 (two) times daily.   OXYCODONE (ROXICODONE) 15 MG IMMEDIATE RELEASE TABLET    Take 1 tablet (15 mg total) by mouth every 4 (four) hours as needed for pain.   POTASSIUM CHLORIDE SA (K-DUR,KLOR-CON) 20 MEQ TABLET     Take 1 tablet by mouth daily.   PRAMIPEXOLE (MIRAPEX) 0.125 MG TABLET    Take 1 tablet by mouth daily. 2-3 hours before bedtime for restless leg   RANITIDINE (ZANTAC) 150 MG TABLET    Take 1 tablet by mouth 2 (two) times daily as needed. As needed for stomach burning   ROPINIROLE (REQUIP) 4 MG TABLET    TAKE ONE TABLET BY MOUTH EVERY NIGHT AT BEDTIME FOR RESTLESS LEG SYNDROME   ROSUVASTATIN (CRESTOR) 40 MG TABLET    TAKE ONE (1) TABLET EACH DAY *GENERIC CRESTOR   SILDENAFIL (REVATIO) 20 MG TABLET    Take 2.5 tablets by mouth as needed.   TORSEMIDE (DEMADEX) 20 MG TABLET    Take 1 tablet by mouth daily.   XARELTO 15 MG TABS TABLET    TAKE ONE TABLET BY MOUTH DAILY WITH EVENING MEAL   ZOLPIDEM (AMBIEN) 5 MG TABLET    Take 1 tablet by mouth at bedtime. Reported on 07/08/2015    Review of Systems  Constitutional: Negative for fever, chills and appetite change.  Respiratory: Positive for shortness of breath. Negative for chest tightness and wheezing.   Cardiovascular: Positive for leg swelling. Negative for chest  pain and palpitations.  Gastrointestinal: Negative for nausea, vomiting and abdominal pain.  Musculoskeletal: Positive for arthralgias (in toes).  Skin: Positive for color change (in legs and feet).  Neurological: Positive for dizziness.    Social History  Substance Use Topics  . Smoking status: Former Research scientist (life sciences)  . Smokeless tobacco: Not on file     Comment: Quit in 1990  . Alcohol Use: No   Objective:   BP 120/58 mmHg  Pulse 65  Temp(Src) 98 F (36.7 C) (Oral)  Resp 20  Wt 199 lb (90.266 kg)  SpO2 98%  Physical Exam   General Appearance:    Alert, cooperative, no distress  Eyes:    PERRL, conjunctiva/corneas clear, EOM's intact       Lungs:     Clear to auscultation bilaterally, respirations unlabored  Heart:    Regular rate and rhythm  Neurologic:   Awake, alert, oriented x 3. No apparent focal neurological           defect.   Ext:    Right great toe red, inflamed, and  tender from MPT to IP. 2+ edema both LE       Assessment & Plan:     1. Acute idiopathic gout of right foot New onset. Suspected diagnosis. Start colchicine and cover with abx for possible cellulitis.  - Uric acid - colchicine 0.6 MG tablet; 2 tablets at once, then once a day until gout has resolved  Dispense: 30 tablet; Refill: 1 - cephALEXin (KEFLEX) 500 MG capsule; Take 1 capsule (500 mg total) by mouth 4 (four) times daily.  Dispense: 28 capsule; Refill: 0  2. Dizziness  - CBC - Comprehensive metabolic panel - meclizine (ANTIVERT) 25 MG tablet; Take 1 tablet (25 mg total) by mouth 3 (three) times daily as needed for dizziness.  Dispense: 30 tablet; Refill: 0 - Magnesium       Lelon Huh, MD  [facility]

## 2015-07-08 NOTE — Patient Instructions (Signed)

## 2015-07-09 ENCOUNTER — Telehealth: Payer: Self-pay

## 2015-07-09 ENCOUNTER — Encounter: Payer: Self-pay | Admitting: Family Medicine

## 2015-07-09 DIAGNOSIS — E790 Hyperuricemia without signs of inflammatory arthritis and tophaceous disease: Secondary | ICD-10-CM

## 2015-07-09 LAB — CBC
Hematocrit: 37.8 % (ref 37.5–51.0)
Hemoglobin: 12.8 g/dL (ref 12.6–17.7)
MCH: 29.5 pg (ref 26.6–33.0)
MCHC: 33.9 g/dL (ref 31.5–35.7)
MCV: 87 fL (ref 79–97)
Platelets: 304 10*3/uL (ref 150–379)
RBC: 4.34 x10E6/uL (ref 4.14–5.80)
RDW: 16.1 % — ABNORMAL HIGH (ref 12.3–15.4)
WBC: 13.5 10*3/uL — ABNORMAL HIGH (ref 3.4–10.8)

## 2015-07-09 LAB — COMPREHENSIVE METABOLIC PANEL
ALT: 16 IU/L (ref 0–44)
AST: 21 IU/L (ref 0–40)
Albumin/Globulin Ratio: 1.5 (ref 1.1–2.5)
Albumin: 4.6 g/dL (ref 3.5–4.8)
Alkaline Phosphatase: 72 IU/L (ref 39–117)
BUN/Creatinine Ratio: 27 — ABNORMAL HIGH (ref 10–22)
BUN: 45 mg/dL — ABNORMAL HIGH (ref 8–27)
Bilirubin Total: 0.6 mg/dL (ref 0.0–1.2)
CO2: 26 mmol/L (ref 18–29)
Calcium: 8.9 mg/dL (ref 8.6–10.2)
Chloride: 92 mmol/L — ABNORMAL LOW (ref 96–106)
Creatinine, Ser: 1.68 mg/dL — ABNORMAL HIGH (ref 0.76–1.27)
GFR calc Af Amer: 44 mL/min/{1.73_m2} — ABNORMAL LOW (ref >59)
GFR calc non Af Amer: 38 mL/min/{1.73_m2} — ABNORMAL LOW (ref >59)
Globulin, Total: 3 g/dL (ref 1.5–4.5)
Glucose: 110 mg/dL — ABNORMAL HIGH (ref 65–99)
Potassium: 3 mmol/L — ABNORMAL LOW (ref 3.5–5.2)
Sodium: 139 mmol/L (ref 134–144)
Total Protein: 7.6 g/dL (ref 6.0–8.5)

## 2015-07-09 LAB — URIC ACID: Uric Acid: 13.4 mg/dL — ABNORMAL HIGH (ref 3.7–8.6)

## 2015-07-09 LAB — MAGNESIUM: Magnesium: 1.7 mg/dL (ref 1.6–2.3)

## 2015-07-09 MED ORDER — ALLOPURINOL 100 MG PO TABS: 100.0000 mg | ORAL_TABLET | Freq: Every day | ORAL | Status: DC

## 2015-07-09 NOTE — Telephone Encounter (Signed)
Advised patient of results. Medication sent into pharmacy. Appt scheduled for F/U.

## 2015-07-09 NOTE — Telephone Encounter (Signed)
-----   Message from Birdie Sons, MD sent at 07/09/2015 12:45 PM EST ----- Uric acid level is very high which is why he has gout. To lower uric acid level need to start allopurinol 100mg  daily  #30, rf x 12. Continue colchicine that was prescribed at office visit for for one month. Potassium levels are still low which is probably why he is having cramping. Need to increase to 4 potassium pills per day Follow up office visit 4 weeks and recheck uric acid and potassium at follow up.

## 2015-07-10 ENCOUNTER — Telehealth: Payer: Self-pay | Admitting: Family Medicine

## 2015-07-10 MED ORDER — POTASSIUM CHLORIDE CRYS ER 20 MEQ PO TBCR: 40.0000 meq | EXTENDED_RELEASE_TABLET | Freq: Two times a day (BID) | ORAL | Status: DC

## 2015-07-10 NOTE — Telephone Encounter (Signed)
Pt contacted office for refill request on the following medications:  potassium chloride SA (K-DUR,KLOR-CON) 20 MEQ tablet.  Vincent Mason.  [id number]  Pt states Dr Caryn Section advised him to take 4 tablets a day so pt is stating the dosage may not be correct.  Pt states he is completely out.

## 2015-07-10 NOTE — Telephone Encounter (Signed)
Please advise 

## 2015-07-15 ENCOUNTER — Other Ambulatory Visit: Payer: Self-pay | Admitting: Family Medicine

## 2015-07-15 MED ORDER — ROPINIROLE HCL 4 MG PO TABS: ORAL_TABLET | ORAL | Status: DC

## 2015-07-15 NOTE — Telephone Encounter (Signed)
Patient requesting refill  Thanks

## 2015-07-15 NOTE — Telephone Encounter (Signed)
Pt needs new refill on the rOPINIRole (REQUIP) 4 MG tablet Taking 06/28/15 -- Birdie Sons, MD TAKE ONE TABLET BY MOUTH EVERY NIGHT AT BEDTIME FOR RESTLESS LEG SYNDROME  Sometimes he takes two at night.  [name].  Thanks, [employer]

## 2015-07-17 ENCOUNTER — Other Ambulatory Visit: Payer: Self-pay | Admitting: Family Medicine

## 2015-07-17 ENCOUNTER — Telehealth: Payer: Self-pay | Admitting: Family Medicine

## 2015-07-17 NOTE — Telephone Encounter (Signed)
He is only supposed to take 4mg  of ropinirole at night... That is the maximum dose.

## 2015-07-17 NOTE — Telephone Encounter (Signed)
Patient was notified. Patient expressed understanding.  

## 2015-07-17 NOTE — Telephone Encounter (Signed)
Pharmacy has received the potassium rx already. However pharmacist said that pt is not taking Requip as prescribed. Patient is taking 2 tablets most nights.

## 2015-07-17 NOTE — Telephone Encounter (Signed)
Pt contacted office for refill request on the following medications: 1. rOPINIRole (REQUIP) 4 MG tablet  2. potassium chloride SA (K-DUR,KLOR-CON) 20 MEQ tablet  **Pt stated he needs both RX changed. Pt stated that the potassium chloride SA (K-DUR,KLOR-CON) 20 MEQ tablet was changed to 4 tablets twice a day. Pt also stated that he has to take 2 rOPINIRole (REQUIP) 4 MG tablet at night sometimes. Pt stated that he wasn't directed to take 2 but he said sometimes that is the only way to get the pain to go away. So pt would like a new RX for both medications so that he can get them filled today b/c he is out of both. I advised pt that Dr. Caryn Section would have to approve the dosage change. [name]. Please advise. Thanks TNP

## 2015-07-17 NOTE — Telephone Encounter (Signed)
Pt would like to see if there is another medication that he could try since rOPINIRole (REQUIP) 4 MG tablet doesn't always help. Please advise. Thanks TNP

## 2015-07-19 ENCOUNTER — Other Ambulatory Visit: Payer: Self-pay | Admitting: Family Medicine

## 2015-07-19 DIAGNOSIS — M549 Dorsalgia, unspecified: Secondary | ICD-10-CM

## 2015-07-19 MED ORDER — OXYCODONE HCL 15 MG PO TABS: 15.0000 mg | ORAL_TABLET | ORAL | Status: DC | PRN

## 2015-07-19 NOTE — Telephone Encounter (Signed)
Pt contacted office for refill request on the following medications:  oxyCODONE (ROXICODONE) 15 MG immediate release tablet.  [id number]  Pt will be out on Saturday.

## 2015-08-05 ENCOUNTER — Ambulatory Visit (INDEPENDENT_AMBULATORY_CARE_PROVIDER_SITE_OTHER): Payer: Medicare Other | Admitting: Family Medicine

## 2015-08-05 ENCOUNTER — Encounter: Payer: Self-pay | Admitting: Family Medicine

## 2015-08-05 VITALS — BP 104/60 | HR 60 | Temp 97.5°F | Resp 16 | Wt 208.0 lb

## 2015-08-05 DIAGNOSIS — E876 Hypokalemia: Secondary | ICD-10-CM | POA: Diagnosis not present

## 2015-08-05 DIAGNOSIS — R601 Generalized edema: Secondary | ICD-10-CM | POA: Diagnosis not present

## 2015-08-05 DIAGNOSIS — E790 Hyperuricemia without signs of inflammatory arthritis and tophaceous disease: Secondary | ICD-10-CM | POA: Diagnosis not present

## 2015-08-05 DIAGNOSIS — M109 Gout, unspecified: Secondary | ICD-10-CM | POA: Diagnosis not present

## 2015-08-05 NOTE — Progress Notes (Signed)
Patient: Vincent Mason Male    DOB: 03/27/1936   80 y.o.   MRN: [id number] Visit Date: 08/05/2015  Today's Provider: Lelon Huh, MD   Chief Complaint  Patient presents with  . Follow-up    Hypokalemia  . Gout    follow up   Subjective:    HPI  Mr Buter took it upon himself to double his dose of metolazone and torsemide in January due to swelling which has greatly improved. Since then he has been feeling a little dizzy and developed gout as below, which he states he had never had in the past. His potassium was subsequently found to be 2.9 and he has been having much more cramping in his legs. He has since doubled potassium dose, but cramps are not much better.   Follow up Gout:  Patient was last seen 07/08/2015. Labs were checked showing very high uric acid levels. Patient was started on Allopurinol 100mg  daily and Colchicine. Patient is here for a 1 month follow up evaluation. Patient reports good compliance with treatment and good symptom control. Patient states the swelling in his legs has decreased. He also states he having some urge incontinence since increase dose of diuretics.   Follow up Hypokalemia:  Last office visit was 07/08/2015. Labs were ordered showing potassium levels still low. Changes made during that visit include increasing Potassium supplements to 4 pills daily and advisng patient to follow up in 4 weeks for recheck. Patient comes in today reporting that he increased his Potassium supplement to 4 pills twice a day. Patient states the cramping in his feet has improved.  CMP Latest Ref Rng 07/08/2015 01/18/2015 01/17/2015  Glucose 65 - 99 mg/dL 110(H) 115(H) 152(H)  BUN 8 - 27 mg/dL 45(H) 26(H) 36(H)  Creatinine 0.76 - 1.27 mg/dL 1.68(H) 1.34(H) 1.60(H)  Sodium 134 - 144 mmol/L 139 139 138  Potassium 3.5 - 5.2 mmol/L 3.0(L) 3.1(L) 3.0(L)  Chloride 96 - 106 mmol/L 92(L) 102 99(L)  CO2 18 - 29 mmol/L 26 28 27   Calcium 8.6 - 10.2 mg/dL 8.9 9.2 8.8(L)  Total  Protein 6.0 - 8.5 g/dL 7.6 6.9 6.9  Total Bilirubin 0.0 - 1.2 mg/dL 0.6 1.0 0.4  Alkaline Phos 39 - 117 IU/L 72 39 44  AST 0 - 40 IU/L 21 48(H) 31  ALT 0 - 44 IU/L 16 16(L) 13(L)    Lab Results  Component Value Date   WBC 13.5* 07/08/2015   HGB 13.1 01/18/2015   HCT 37.8 07/08/2015   MCV 87 07/08/2015   PLT 304 07/08/2015       Allergies  Allergen Reactions  . Decongestant  [Oxymetazoline]    Previous Medications   ALLOPURINOL (ZYLOPRIM) 100 MG TABLET    Take 1 tablet (100 mg total) by mouth daily.   BENAZEPRIL (LOTENSIN) 20 MG TABLET    Take 1 tablet by mouth daily. Reported on 07/08/2015   COENZYME Q10 (COQ10) 200 MG CAPS    Take 1 capsule by mouth daily.   COLCHICINE 0.6 MG TABLET    2 tablets at once, then once a day until gout has resolved   DIAZEPAM (VALIUM) 5 MG TABLET    Take 1 tablet by mouth every 8 (eight) hours. Reported on 07/08/2015   DIGOX 125 MCG TABLET    Take 1 tablet by mouth daily.   MECLIZINE (ANTIVERT) 25 MG TABLET    Take 1 tablet (25 mg total) by mouth 3 (three) times daily as  needed for dizziness.   METOLAZONE (ZAROXOLYN) 5 MG TABLET    Take 1 tablet by mouth daily.   METOPROLOL SUCCINATE (TOPROL-XL) 100 MG 24 HR TABLET    TAKE ONE-HALF TABLET DAILY   OXCARBAZEPINE (TRILEPTAL) 150 MG TABLET    Take 1 tablet (150 mg total) by mouth 2 (two) times daily.   OXCARBAZEPINE (TRILEPTAL) 300 MG TABLET    Take 1 tablet (300 mg total) by mouth 2 (two) times daily.   OXYCODONE (ROXICODONE) 15 MG IMMEDIATE RELEASE TABLET    Take 1 tablet (15 mg total) by mouth every 4 (four) hours as needed for pain.   POTASSIUM CHLORIDE SA (K-DUR,KLOR-CON) 20 MEQ TABLET    Take 2 tablets (40 mEq total) by mouth 2 (two) times daily.   PRAMIPEXOLE (MIRAPEX) 0.125 MG TABLET    Take 1 tablet by mouth daily. 2-3 hours before bedtime for restless leg   RANITIDINE (ZANTAC) 150 MG TABLET    Take 1 tablet by mouth 2 (two) times daily as needed. As needed for stomach burning   ROPINIROLE  (REQUIP) 4 MG TABLET    TAKE ONE TABLET BY MOUTH EVERY NIGHT AT BEDTIME FOR RESTLESS LEG SYNDROME   ROSUVASTATIN (CRESTOR) 40 MG TABLET    TAKE ONE (1) TABLET EACH DAY *GENERIC CRESTOR   SILDENAFIL (REVATIO) 20 MG TABLET    Take 2.5 tablets by mouth as needed.   TORSEMIDE (DEMADEX) 20 MG TABLET    Take 1 tablet by mouth daily.   XARELTO 15 MG TABS TABLET    TAKE ONE TABLET BY MOUTH DAILY WITH EVENING MEAL   ZOLPIDEM (AMBIEN) 5 MG TABLET    Take 1 tablet by mouth at bedtime. Reported on 07/08/2015    Review of Systems  Constitutional: Negative for fever, chills and appetite change.  Respiratory: Negative for chest tightness, shortness of breath and wheezing.   Cardiovascular: Positive for leg swelling. Negative for chest pain and palpitations.  Gastrointestinal: Negative for nausea, vomiting and abdominal pain.  Musculoskeletal: Positive for myalgias (cramping in feet).    Social History  Substance Use Topics  . Smoking status: Former Research scientist (life sciences)  . Smokeless tobacco: Not on file     Comment: Quit in 1990  . Alcohol Use: No   Objective:   BP 104/60 mmHg  Pulse 60  Temp(Src) 97.5 F (36.4 C) (Oral)  Resp 16  Wt 208 lb (94.348 kg)  Physical Exam   General Appearance:    Alert, cooperative, no distress  Eyes:    PERRL, conjunctiva/corneas clear, EOM's intact       Lungs:     Clear to auscultation bilaterally, respirations unlabored  Heart:    Regular rate and rhythm  Neurologic:   Awake, alert, oriented x 3. No apparent focal neurological           defect.           Assessment & Plan:     1. Hypokalemia  - Renal Function Panel - Magnesium  2. Gout, unspecified cause, unspecified chronicity, unspecified site  - Magnesium - Uric acid  3. Hyperuricemia  4. Urge incontinence.   5. Dizziness  6. Generalized edema Extensively counseled patient that all of the above problems can be adverse effects of over diuresis, and that swelling is not in and of itself a threat to his  health. Advised to cut back on torsemide to one a day and only take a second tablet if he feels short of breath or if swelling causes  any pain or discomfort.        Lelon Huh, MD  [facility]

## 2015-08-06 ENCOUNTER — Telehealth: Payer: Self-pay | Admitting: *Deleted

## 2015-08-06 ENCOUNTER — Encounter: Payer: Self-pay | Admitting: Family Medicine

## 2015-08-06 LAB — RENAL FUNCTION PANEL
Albumin: 4.6 g/dL (ref 3.5–4.8)
BUN/Creatinine Ratio: 26 — ABNORMAL HIGH (ref 10–22)
BUN: 41 mg/dL — ABNORMAL HIGH (ref 8–27)
CO2: 30 mmol/L — ABNORMAL HIGH (ref 18–29)
Calcium: 8.7 mg/dL (ref 8.6–10.2)
Chloride: 95 mmol/L — ABNORMAL LOW (ref 96–106)
Creatinine, Ser: 1.57 mg/dL — ABNORMAL HIGH (ref 0.76–1.27)
GFR calc Af Amer: 48 mL/min/{1.73_m2} — ABNORMAL LOW (ref >59)
GFR calc non Af Amer: 42 mL/min/{1.73_m2} — ABNORMAL LOW (ref >59)
Glucose: 122 mg/dL — ABNORMAL HIGH (ref 65–99)
Phosphorus: 2.9 mg/dL (ref 2.5–4.5)
Potassium: 4.1 mmol/L (ref 3.5–5.2)
Sodium: 141 mmol/L (ref 134–144)

## 2015-08-06 LAB — URIC ACID: Uric Acid: 11.1 mg/dL — ABNORMAL HIGH (ref 3.7–8.6)

## 2015-08-06 LAB — MAGNESIUM: Magnesium: 1.6 mg/dL (ref 1.6–2.3)

## 2015-08-06 MED ORDER — ALLOPURINOL 300 MG PO TABS: 300.0000 mg | ORAL_TABLET | Freq: Every day | ORAL | Status: DC

## 2015-08-06 NOTE — Telephone Encounter (Signed)
-----   Message from Birdie Sons, MD sent at 08/06/2015  8:22 AM EDT ----- Uric acid levels are still very high. Need increase allopurinol to 300mg  daily, #30, rf x 5. Also magnesium levels are low which is probably contributing to cramping. Need to start OTC magnesium Oxide 400mg  daily. These are all side effects of his fluid pills and he should cut back on them as much as possible. Follow up 3 months.

## 2015-08-06 NOTE — Telephone Encounter (Signed)
Patient was notified of results. Patient expressed understanding. Rx was sent to pharmacy.  

## 2015-08-07 DIAGNOSIS — I482 Chronic atrial fibrillation: Secondary | ICD-10-CM | POA: Diagnosis not present

## 2015-08-07 DIAGNOSIS — E782 Mixed hyperlipidemia: Secondary | ICD-10-CM | POA: Diagnosis not present

## 2015-08-07 DIAGNOSIS — N183 Chronic kidney disease, stage 3 (moderate): Secondary | ICD-10-CM | POA: Diagnosis not present

## 2015-08-07 DIAGNOSIS — I10 Essential (primary) hypertension: Secondary | ICD-10-CM | POA: Diagnosis not present

## 2015-08-13 ENCOUNTER — Other Ambulatory Visit: Payer: Self-pay | Admitting: Family Medicine

## 2015-08-13 NOTE — Telephone Encounter (Signed)
Pt states that since you increased his allopurinol (ZYLOPRIM) 300 MG tablet   He has been having leg pain and swelling , weight pain.  Really uncomfortable.  His call back is [phone]  Thanks,  Con Memos

## 2015-08-13 NOTE — Telephone Encounter (Signed)
Please advise 

## 2015-08-14 NOTE — Telephone Encounter (Signed)
Could be caused by problems with kidneys, liver, heart, thyroid or medications. Is probably due to allopurinol since that is the only thing that has changed recently. Need to keep feet elevated when not walking. Limit sodium to no more than 1500mg  a day, and stop allopurinol for a few days. If not better by Monday then need to come in for office visit.

## 2015-08-14 NOTE — Telephone Encounter (Signed)
Pt called back wanting to know what he needs to do.  Thanks Con Memos

## 2015-08-14 NOTE — Telephone Encounter (Signed)
Swelling is not common with allopurinol, but sometimes occurs at higher doses. He should stop it for the next couple of days, if swelling goes away we will need to get him a different medications. If swelling does improve we will need to see him in the office.

## 2015-08-14 NOTE — Telephone Encounter (Signed)
Patient was notified. Patient stated that he has gained 15 lbs in one week and was wondering if this could be something more then the allopurinol causing swelling?

## 2015-08-15 ENCOUNTER — Other Ambulatory Visit: Payer: Self-pay | Admitting: *Deleted

## 2015-08-15 DIAGNOSIS — M549 Dorsalgia, unspecified: Secondary | ICD-10-CM

## 2015-08-15 NOTE — Telephone Encounter (Signed)
Pt contacted office for refill request on the following medications: °oxyCODONE (ROXICODONE) 15 MG immediate release tablet Thanks TNP °

## 2015-08-15 NOTE — Telephone Encounter (Signed)
Patient was notified. Patient expressed understanding.  

## 2015-08-16 ENCOUNTER — Telehealth: Payer: Self-pay | Admitting: Family Medicine

## 2015-08-16 MED ORDER — OXYCODONE HCL 15 MG PO TABS: 15.0000 mg | ORAL_TABLET | ORAL | Status: DC | PRN

## 2015-08-20 ENCOUNTER — Telehealth: Payer: Self-pay

## 2015-08-20 MED ORDER — FEBUXOSTAT 80 MG PO TABS: 1.0000 | ORAL_TABLET | Freq: Every day | ORAL | Status: DC

## 2015-08-20 NOTE — Telephone Encounter (Signed)
Ok to keep on hold. Once swelling goes down should change to Uloric 80mg  daily, #30, rf x 3

## 2015-08-20 NOTE — Telephone Encounter (Signed)
Patient was notified. Patient expressed understanding. Rx sent to pharmacy.

## 2015-08-20 NOTE — Telephone Encounter (Signed)
Patient called stating that Dr. Caryn Section increased his Allopurinol from 100mg  to 300mg . Patient states he did increase the medication as he was advised to and this caused a 5lb weight gain in 1 week and swelling in his legs. Patient states Dr. Caryn Section told him to hold off on taking the Allopurinol for 3-4 days. Patient has done this and the swelling has gone down some and he has lost 3 pounds. Patient would like permission to keep the Allopurinol on hold for a few more days. Please call patient and advise.

## 2015-08-23 ENCOUNTER — Other Ambulatory Visit: Payer: Self-pay | Admitting: Family Medicine

## 2015-08-26 ENCOUNTER — Encounter: Payer: Self-pay | Admitting: Family Medicine

## 2015-08-26 ENCOUNTER — Ambulatory Visit (INDEPENDENT_AMBULATORY_CARE_PROVIDER_SITE_OTHER): Payer: Medicare Other | Admitting: Family Medicine

## 2015-08-26 ENCOUNTER — Other Ambulatory Visit: Payer: Self-pay | Admitting: Family Medicine

## 2015-08-26 VITALS — BP 108/54 | HR 57 | Temp 97.4°F | Resp 20 | Wt 225.0 lb

## 2015-08-26 DIAGNOSIS — I272 Other secondary pulmonary hypertension: Secondary | ICD-10-CM

## 2015-08-26 DIAGNOSIS — E790 Hyperuricemia without signs of inflammatory arthritis and tophaceous disease: Secondary | ICD-10-CM | POA: Diagnosis not present

## 2015-08-26 DIAGNOSIS — I071 Rheumatic tricuspid insufficiency: Secondary | ICD-10-CM

## 2015-08-26 DIAGNOSIS — I482 Chronic atrial fibrillation, unspecified: Secondary | ICD-10-CM

## 2015-08-26 DIAGNOSIS — I890 Lymphedema, not elsewhere classified: Secondary | ICD-10-CM | POA: Diagnosis not present

## 2015-08-26 DIAGNOSIS — E876 Hypokalemia: Secondary | ICD-10-CM

## 2015-08-26 MED ORDER — TORSEMIDE 20 MG PO TABS: ORAL_TABLET | ORAL | Status: DC

## 2015-08-26 NOTE — Progress Notes (Signed)
Patient: Vincent Mason Male    DOB: [birth year]   81 y.o.   MRN: [id number] Visit Date: 08/26/2015  Today's Provider: Lelon Huh, MD   Chief Complaint  Patient presents with  . Edema    in legs and testicles   Subjective:    HPI Edema: Presents complain swelling in both legs and scrotum. Patient reports he has had worsening swelling in his legs for several weeks. He states the swelling is his testicles started 2 days ago. Patient denies any pain. Patient states he does have shortness of breath during exertion. Patient says his testicles are the size of grapefruit. Swelling in his legs has worsened to the point where his legs are draining fluid. He has double his metolozone and torsemide to 2 a day which helped for a few days, but then swelling continued to worsen. He states he uses artificial salt, but does not add regular salt to his foods. He doesn't make any other specific dietary salt restrictions. Edema is thought to be secondary to severe TR and pulmonary hypertension followed by Dr. Cammie Sickle. His last echo was 2015 with normal EF  Office Visit on 08/05/2015  Component Date Value Ref Range Status  . Glucose 08/05/2015 122* 65 - 99 mg/dL Final  . BUN 08/05/2015 41* 8 - 27 mg/dL Final  . Creatinine, Ser 08/05/2015 1.57* 0.76 - 1.27 mg/dL Final  . GFR calc non Af Amer 08/05/2015 42* >59 mL/min/1.73 Final  . GFR calc Af Amer 08/05/2015 48* >59 mL/min/1.73 Final  . BUN/Creatinine Ratio 08/05/2015 26* 10 - 22 Final  . Sodium 08/05/2015 141  134 - 144 mmol/L Final  . Potassium 08/05/2015 4.1  3.5 - 5.2 mmol/L Final  . Chloride 08/05/2015 95* 96 - 106 mmol/L Final  . CO2 08/05/2015 30* 18 - 29 mmol/L Final  . Calcium 08/05/2015 8.7  8.6 - 10.2 mg/dL Final  . Phosphorus 08/05/2015 2.9  2.5 - 4.5 mg/dL Final  . Albumin 08/05/2015 4.6  3.5 - 4.8 g/dL Final  . Magnesium 08/05/2015 1.6  1.6 - 2.3 mg/dL Final  . Uric Acid 08/05/2015 11.1* 3.7 - 8.6 mg/dL Final   Therapeutic target for gout patients: <6.0   Last TSH in 12/2013 was 6.780     Allergies  Allergen Reactions  . Decongestant  [Oxymetazoline]    Previous Medications   ALLOPURINOL (ZYLOPRIM) 300 MG TABLET    Take 1 tablet (300 mg total) by mouth daily.   BENAZEPRIL (LOTENSIN) 20 MG TABLET    Take 1 tablet by mouth daily. Reported on 07/08/2015   COENZYME Q10 (COQ10) 200 MG CAPS    Take 1 capsule by mouth daily.   COLCHICINE 0.6 MG TABLET    Take 1 tablet (0.6 mg total) by mouth daily as needed (for gout).   DIAZEPAM (VALIUM) 5 MG TABLET    Take 1 tablet by mouth every 8 (eight) hours. Reported on 07/08/2015   DIGOX 125 MCG TABLET    Take 1 tablet by mouth daily.   FEBUXOSTAT (ULORIC) 80 MG TABS    Take 1 tablet (80 mg total) by mouth daily.   MECLIZINE (ANTIVERT) 25 MG TABLET    Take 1 tablet (25 mg total) by mouth 3 (three) times daily as needed for dizziness.   METOLAZONE (ZAROXOLYN) 5 MG TABLET    Take 1 tablet by mouth daily.   METOPROLOL SUCCINATE (TOPROL-XL) 100 MG 24 HR TABLET    TAKE ONE-HALF TABLET DAILY  OXCARBAZEPINE (TRILEPTAL) 150 MG TABLET    Take 1 tablet (150 mg total) by mouth 2 (two) times daily.   OXCARBAZEPINE (TRILEPTAL) 300 MG TABLET    Take 1 tablet (300 mg total) by mouth 2 (two) times daily.   OXYCODONE (ROXICODONE) 15 MG IMMEDIATE RELEASE TABLET    Take 1 tablet (15 mg total) by mouth every 4 (four) hours as needed for pain.   POTASSIUM CHLORIDE SA (K-DUR,KLOR-CON) 20 MEQ TABLET    Take 2 tablets (40 mEq total) by mouth 2 (two) times daily.   PRAMIPEXOLE (MIRAPEX) 0.125 MG TABLET    Take 1 tablet by mouth daily. 2-3 hours before bedtime for restless leg   RANITIDINE (ZANTAC) 150 MG TABLET    Take 1 tablet by mouth 2 (two) times daily as needed. As needed for stomach burning   ROPINIROLE (REQUIP) 4 MG TABLET    TAKE ONE TABLET BY MOUTH EVERY NIGHT AT BEDTIME FOR RESTLESS LEG SYNDROME   ROSUVASTATIN (CRESTOR) 40 MG TABLET    TAKE ONE (1) TABLET EACH DAY *GENERIC  CRESTOR   SILDENAFIL (REVATIO) 20 MG TABLET    Take 2.5 tablets by mouth as needed.   TORSEMIDE (DEMADEX) 20 MG TABLET    Take 1 tablet by mouth daily.   XARELTO 15 MG TABS TABLET    TAKE ONE TABLET BY MOUTH DAILY WITH EVENING MEAL   ZOLPIDEM (AMBIEN) 5 MG TABLET    Take 1 tablet by mouth at bedtime. Reported on 07/08/2015    Review of Systems  Constitutional: Positive for fatigue. Negative for fever, chills and appetite change.  Respiratory: Positive for shortness of breath. Negative for chest tightness and wheezing.   Cardiovascular: Positive for leg swelling. Negative for chest pain and palpitations.  Gastrointestinal: Negative for nausea, vomiting and abdominal pain.  Genitourinary: Positive for scrotal swelling.  Skin:       Redness in lower legs    Social History  Substance Use Topics  . Smoking status: Former Research scientist (life sciences)  . Smokeless tobacco: Not on file     Comment: Quit in 1990  . Alcohol Use: No   Objective:   BP 108/54 mmHg  Pulse 57  Temp(Src) 97.4 F (36.3 C) (Oral)  Resp 20  Wt 225 lb (102.059 kg)  SpO2 98%  Physical Exam   General Appearance:    Alert, cooperative, no distress  Eyes:    PERRL, conjunctiva/corneas clear, EOM's intact       Lungs:     Clear to auscultation bilaterally, respirations unlabored  Heart:    Regular rate and rhythm  Neurologic:   Awake, alert, oriented x 3. No apparent focal neurological           defect.   Ext:   4+ bilateral LE edema. Moderate scrotal edema.        Assessment & Plan:     1. Lymphedema Primarily due to TR, pulmonary hypertension, and a-fib. Is having a number adverse effects from diuretics as below. Will double torsemide for a few day.   - Ambulatory referral to Physical Therapy - torsemide (DEMADEX) 20 MG tablet; 2 tablets once or twice a day for swelling  Dispense: 1 tablet; Refill: 1 - OT evaluation; Future  2. Hyperuricemia   3. Hypertensive pulmonary vascular disease ([place])   4. Chronic atrial  fibrillation ([facility])   5. TI (tricuspid incompetence)   6. Hypokalemia  Will ned to recheck electrolytes later this week. Has also been awhile since thyroid functions were check .  Plan on checking this with next blood draw.        Lelon Huh, MD  [facility]

## 2015-08-26 NOTE — Patient Instructions (Signed)
Double dose of torsemide to 2 tablets twice a day for the next three days   Do not stand or sit with feet on floor for more then 1 minute   Always keep legs elevate above level of heart when not walking    Lymphedema Lymphedema is swelling that is caused by the abnormal collection of lymph under the skin. Lymph is fluid from the tissues in your body that travels in the lymphatic system. This system is part of the immune system and includes lymph nodes and lymph vessels. The lymph vessels collect and carry the excess fluid, fats, proteins, and wastes from the tissues of the body to the bloodstream. This system also works to clean and remove bacteria and waste products from the body. Lymphedema occurs when the lymphatic system is blocked. When the lymph vessels or lymph nodes are blocked or damaged, lymph does not drain properly, causing an abnormal buildup of lymph. This leads to swelling in the arms or legs. Lymphedema cannot be cured by medicines, but various methods can be used to help reduce the swelling. CAUSES There are two types of lymphedema. Primary lymphedema is caused by the absence or abnormality of the lymph vessel at birth. Secondary lymphedema is more common. It occurs when the lymph vessel is damaged or blocked. Common causes of lymph vessel blockage include:  Skin infection, such as cellulitis.  Infection by parasites (filariasis).  Injury.  Cancer.  Radiation therapy.  Formation of scar tissue.  Surgery. SYMPTOMS Symptoms of this condition include:  Swelling of the arm or leg.  A heavy or tight feeling in the arm or leg.  Swelling of the feet, toes, or fingers. Shoes or rings may fit more tightly than before.  Redness of the skin over the affected area.  Limited movement of the affected limb.  Sensitivity to touch or discomfort in the affected limb. DIAGNOSIS This condition may be diagnosed with:  A physical exam.  Medical history.  Imaging tests,  such as:  Lymphoscintigraphy. In this test, a low dose of a radioactive substance is injected to trace the flow of lymph through the lymph vessels.  MRI.  CT scan.  Duplex ultrasound. This test uses sound waves to produce images of the vessels and the blood flow on a screen.  Lymphangiography. In this test, a contrast dye is injected into the lymph vessel to help show blockages. TREATMENT Treatment for this condition may depend on the cause. Treatment may include:  Exercise. Certain exercises can help fluid move out of the affected limb.  Massage. Gentle massage of the affected limb can help move the fluid out of the area.  Compression. Various methods may be used to apply pressure to the affected limb in order to reduce the swelling.  Wearing compression stockings or sleeves on the affected limb.  Bandaging the affected limb.  Using an external pump that is attached to a sleeve that alternates between applying pressure and releasing pressure.  Surgery. This is usually only done for severe cases. For example, surgery may be done if you have trouble moving the limb or if the swelling does not get better with other treatments. If an underlying condition is causing the lymphedema, treatment for that condition is needed. For example, antibiotic medicines may be used to treat an infection. HOME CARE INSTRUCTIONS Activities  Exercise regularly as directed by your health care provider.  Do not sit with your legs crossed.  When possible, keep the affected limb raised (elevated) above the  level of your heart.  Avoid carrying things with an arm that is affected by lymphedema.  Remember that the affected area is more likely to become injured or infected.  Take these steps to help prevent infection:  Keep the affected area clean and dry.  Protect your skin from cuts. For example, you should use gloves while cooking or gardening. Do not walk barefoot. If you shave the affected area,  use an Copy. General Instructions  Take medicines only as directed by your health care provider.  Eat a healthy diet that includes a lot of fruits and vegetables.  Do not wear tight clothes, shoes, or jewelry.  Do not use heating pads over the affected area.  Avoid having blood pressure checked on the affected limb.  Keep all follow-up visits as directed by your health care provider. This is important. SEEK MEDICAL CARE IF:  You continue to have swelling in your limb.  You have a fever.  You have a cut that does not heal.  You have redness or pain in the affected area.  You have new swelling in your limb that comes on suddenly.  You develop purplish spots or sores (lesions) on your limb. SEEK IMMEDIATE MEDICAL CARE IF:  You have a skin rash.  You have chills or sweats.  You have shortness of breath.   This information is not intended to replace advice given to you by your health care provider. Make sure you discuss any questions you have with your health care provider.   Document Released: 03/08/2007 Document Revised: 09/25/2014 Document Reviewed: 04/18/2014 Elsevier Interactive Patient Education [employer].

## 2015-08-27 ENCOUNTER — Other Ambulatory Visit: Payer: Self-pay | Admitting: Family Medicine

## 2015-08-27 ENCOUNTER — Telehealth: Payer: Self-pay | Admitting: Family Medicine

## 2015-08-27 DIAGNOSIS — I890 Lymphedema, not elsewhere classified: Secondary | ICD-10-CM

## 2015-08-27 MED ORDER — OXCARBAZEPINE 150 MG PO TABS: 150.0000 mg | ORAL_TABLET | Freq: Two times a day (BID) | ORAL | Status: DC

## 2015-08-27 MED ORDER — SILDENAFIL CITRATE 20 MG PO TABS: 50.0000 mg | ORAL_TABLET | ORAL | Status: DC | PRN

## 2015-08-27 NOTE — Telephone Encounter (Signed)
Pt states he is take 2 Rx that are not on the list he rec'd from our office.  Pt is reqesting the Rx Sildenafil 20mg  given by Dr Nehemiah Massed and Oxcarbazepine 300mg  given by Dr Melrose Nakayama added to his list of medications.  [id number]

## 2015-08-29 NOTE — Telephone Encounter (Signed)
Patient called back and wanted to know if Dr. Caryn Section thinks he should be taking more of the sildenafil 20 mg, then what he is currently taking. Patient said that he takes 2 tablets qd.  Please advise?

## 2015-09-02 ENCOUNTER — Telehealth: Payer: Self-pay | Admitting: *Deleted

## 2015-09-02 ENCOUNTER — Ambulatory Visit: Payer: Medicare Other | Attending: Family Medicine | Admitting: Occupational Therapy

## 2015-09-02 DIAGNOSIS — I890 Lymphedema, not elsewhere classified: Secondary | ICD-10-CM | POA: Insufficient documentation

## 2015-09-02 DIAGNOSIS — S81809A Unspecified open wound, unspecified lower leg, initial encounter: Secondary | ICD-10-CM

## 2015-09-02 NOTE — Telephone Encounter (Signed)
LMOVM for return call.  

## 2015-09-02 NOTE — Telephone Encounter (Signed)
The recommended dose for pulmonary hypertension is 20mg  three times a day (not all at once).

## 2015-09-02 NOTE — Telephone Encounter (Signed)
Helene Kelp from rehabilitation called to let Dr. Caryn Section know she had to cancel appointment to day due to open wound on pt's left leg. Helene Kelp is requesting that we refer pt to wound care center asap. Three of the wounds on left leg are the size silver dollars. Has some wounds that are draining. Please advise?

## 2015-09-02 NOTE — Telephone Encounter (Signed)
Please refer wound care clinic

## 2015-09-03 ENCOUNTER — Inpatient Hospital Stay (HOSPITAL_COMMUNITY): Payer: Medicare Other

## 2015-09-03 ENCOUNTER — Emergency Department (HOSPITAL_COMMUNITY): Payer: Medicare Other

## 2015-09-03 ENCOUNTER — Encounter (HOSPITAL_COMMUNITY): Payer: Self-pay | Admitting: Emergency Medicine

## 2015-09-03 ENCOUNTER — Other Ambulatory Visit: Payer: Self-pay

## 2015-09-03 ENCOUNTER — Inpatient Hospital Stay (HOSPITAL_COMMUNITY)
Admission: EM | Admit: 2015-09-03 | Discharge: 2015-09-17 | DRG: 871 | Disposition: A | Discharge status: Skilled Nursing Facility | Payer: Medicare Other | Attending: Internal Medicine | Admitting: Internal Medicine

## 2015-09-03 DIAGNOSIS — R404 Transient alteration of awareness: Secondary | ICD-10-CM

## 2015-09-03 DIAGNOSIS — Z95828 Presence of other vascular implants and grafts: Secondary | ICD-10-CM

## 2015-09-03 DIAGNOSIS — F1120 Opioid dependence, uncomplicated: Secondary | ICD-10-CM | POA: Diagnosis present

## 2015-09-03 DIAGNOSIS — R569 Unspecified convulsions: Secondary | ICD-10-CM | POA: Diagnosis not present

## 2015-09-03 DIAGNOSIS — J449 Chronic obstructive pulmonary disease, unspecified: Secondary | ICD-10-CM | POA: Diagnosis present

## 2015-09-03 DIAGNOSIS — I739 Peripheral vascular disease, unspecified: Secondary | ICD-10-CM | POA: Diagnosis present

## 2015-09-03 DIAGNOSIS — J9690 Respiratory failure, unspecified, unspecified whether with hypoxia or hypercapnia: Secondary | ICD-10-CM | POA: Diagnosis not present

## 2015-09-03 DIAGNOSIS — N289 Disorder of kidney and ureter, unspecified: Secondary | ICD-10-CM | POA: Diagnosis not present

## 2015-09-03 DIAGNOSIS — N1832 Chronic kidney disease, stage 3b: Secondary | ICD-10-CM | POA: Diagnosis present

## 2015-09-03 DIAGNOSIS — R1013 Epigastric pain: Secondary | ICD-10-CM | POA: Diagnosis not present

## 2015-09-03 DIAGNOSIS — B370 Candidal stomatitis: Secondary | ICD-10-CM | POA: Diagnosis not present

## 2015-09-03 DIAGNOSIS — J189 Pneumonia, unspecified organism: Secondary | ICD-10-CM

## 2015-09-03 DIAGNOSIS — J690 Pneumonitis due to inhalation of food and vomit: Secondary | ICD-10-CM | POA: Diagnosis present

## 2015-09-03 DIAGNOSIS — R109 Unspecified abdominal pain: Secondary | ICD-10-CM | POA: Diagnosis not present

## 2015-09-03 DIAGNOSIS — F1320 Sedative, hypnotic or anxiolytic dependence, uncomplicated: Secondary | ICD-10-CM | POA: Diagnosis present

## 2015-09-03 DIAGNOSIS — D689 Coagulation defect, unspecified: Secondary | ICD-10-CM | POA: Diagnosis present

## 2015-09-03 DIAGNOSIS — A047 Enterocolitis due to Clostridium difficile: Secondary | ICD-10-CM | POA: Diagnosis not present

## 2015-09-03 DIAGNOSIS — I272 Other secondary pulmonary hypertension: Secondary | ICD-10-CM | POA: Diagnosis not present

## 2015-09-03 DIAGNOSIS — I480 Paroxysmal atrial fibrillation: Secondary | ICD-10-CM | POA: Diagnosis not present

## 2015-09-03 DIAGNOSIS — Z87891 Personal history of nicotine dependence: Secondary | ICD-10-CM | POA: Diagnosis not present

## 2015-09-03 DIAGNOSIS — N179 Acute kidney failure, unspecified: Secondary | ICD-10-CM

## 2015-09-03 DIAGNOSIS — Z7901 Long term (current) use of anticoagulants: Secondary | ICD-10-CM

## 2015-09-03 DIAGNOSIS — G2581 Restless legs syndrome: Secondary | ICD-10-CM | POA: Diagnosis present

## 2015-09-03 DIAGNOSIS — R6521 Severe sepsis with septic shock: Secondary | ICD-10-CM | POA: Diagnosis present

## 2015-09-03 DIAGNOSIS — I481 Persistent atrial fibrillation: Secondary | ICD-10-CM | POA: Diagnosis not present

## 2015-09-03 DIAGNOSIS — N189 Chronic kidney disease, unspecified: Secondary | ICD-10-CM

## 2015-09-03 DIAGNOSIS — I509 Heart failure, unspecified: Secondary | ICD-10-CM | POA: Diagnosis not present

## 2015-09-03 DIAGNOSIS — M549 Dorsalgia, unspecified: Secondary | ICD-10-CM

## 2015-09-03 DIAGNOSIS — Z452 Encounter for adjustment and management of vascular access device: Secondary | ICD-10-CM | POA: Diagnosis not present

## 2015-09-03 DIAGNOSIS — Z951 Presence of aortocoronary bypass graft: Secondary | ICD-10-CM

## 2015-09-03 DIAGNOSIS — N183 Chronic kidney disease, stage 3 unspecified: Secondary | ICD-10-CM | POA: Diagnosis present

## 2015-09-03 DIAGNOSIS — J9601 Acute respiratory failure with hypoxia: Secondary | ICD-10-CM | POA: Diagnosis present

## 2015-09-03 DIAGNOSIS — J9600 Acute respiratory failure, unspecified whether with hypoxia or hypercapnia: Secondary | ICD-10-CM | POA: Diagnosis not present

## 2015-09-03 DIAGNOSIS — D500 Iron deficiency anemia secondary to blood loss (chronic): Secondary | ICD-10-CM | POA: Diagnosis present

## 2015-09-03 DIAGNOSIS — I5020 Unspecified systolic (congestive) heart failure: Secondary | ICD-10-CM | POA: Diagnosis not present

## 2015-09-03 DIAGNOSIS — R1310 Dysphagia, unspecified: Secondary | ICD-10-CM | POA: Diagnosis present

## 2015-09-03 DIAGNOSIS — K7460 Unspecified cirrhosis of liver: Secondary | ICD-10-CM | POA: Diagnosis present

## 2015-09-03 DIAGNOSIS — K219 Gastro-esophageal reflux disease without esophagitis: Secondary | ICD-10-CM | POA: Diagnosis present

## 2015-09-03 DIAGNOSIS — R05 Cough: Secondary | ICD-10-CM

## 2015-09-03 DIAGNOSIS — Z9181 History of falling: Secondary | ICD-10-CM | POA: Diagnosis not present

## 2015-09-03 DIAGNOSIS — G9340 Encephalopathy, unspecified: Secondary | ICD-10-CM

## 2015-09-03 DIAGNOSIS — I130 Hypertensive heart and chronic kidney disease with heart failure and stage 1 through stage 4 chronic kidney disease, or unspecified chronic kidney disease: Secondary | ICD-10-CM | POA: Diagnosis present

## 2015-09-03 DIAGNOSIS — R001 Bradycardia, unspecified: Secondary | ICD-10-CM | POA: Diagnosis present

## 2015-09-03 DIAGNOSIS — I2510 Atherosclerotic heart disease of native coronary artery without angina pectoris: Secondary | ICD-10-CM | POA: Diagnosis present

## 2015-09-03 DIAGNOSIS — G40909 Epilepsy, unspecified, not intractable, without status epilepticus: Secondary | ICD-10-CM | POA: Diagnosis present

## 2015-09-03 DIAGNOSIS — I10 Essential (primary) hypertension: Secondary | ICD-10-CM | POA: Diagnosis present

## 2015-09-03 DIAGNOSIS — Z9289 Personal history of other medical treatment: Secondary | ICD-10-CM

## 2015-09-03 DIAGNOSIS — I482 Chronic atrial fibrillation, unspecified: Secondary | ICD-10-CM | POA: Diagnosis present

## 2015-09-03 DIAGNOSIS — I129 Hypertensive chronic kidney disease with stage 1 through stage 4 chronic kidney disease, or unspecified chronic kidney disease: Secondary | ICD-10-CM | POA: Diagnosis not present

## 2015-09-03 DIAGNOSIS — A419 Sepsis, unspecified organism: Secondary | ICD-10-CM | POA: Diagnosis present

## 2015-09-03 DIAGNOSIS — K7469 Other cirrhosis of liver: Secondary | ICD-10-CM | POA: Diagnosis not present

## 2015-09-03 DIAGNOSIS — I5033 Acute on chronic diastolic (congestive) heart failure: Secondary | ICD-10-CM | POA: Diagnosis present

## 2015-09-03 DIAGNOSIS — N170 Acute kidney failure with tubular necrosis: Secondary | ICD-10-CM | POA: Diagnosis not present

## 2015-09-03 DIAGNOSIS — R195 Other fecal abnormalities: Secondary | ICD-10-CM | POA: Diagnosis present

## 2015-09-03 DIAGNOSIS — D649 Anemia, unspecified: Secondary | ICD-10-CM | POA: Diagnosis not present

## 2015-09-03 DIAGNOSIS — Z4682 Encounter for fitting and adjustment of non-vascular catheter: Secondary | ICD-10-CM | POA: Diagnosis not present

## 2015-09-03 DIAGNOSIS — E785 Hyperlipidemia, unspecified: Secondary | ICD-10-CM | POA: Diagnosis present

## 2015-09-03 DIAGNOSIS — K7689 Other specified diseases of liver: Secondary | ICD-10-CM | POA: Diagnosis present

## 2015-09-03 DIAGNOSIS — G9341 Metabolic encephalopathy: Secondary | ICD-10-CM | POA: Diagnosis present

## 2015-09-03 DIAGNOSIS — R58 Hemorrhage, not elsewhere classified: Secondary | ICD-10-CM

## 2015-09-03 DIAGNOSIS — Y92009 Unspecified place in unspecified non-institutional (private) residence as the place of occurrence of the external cause: Secondary | ICD-10-CM

## 2015-09-03 DIAGNOSIS — N390 Urinary tract infection, site not specified: Secondary | ICD-10-CM | POA: Diagnosis present

## 2015-09-03 DIAGNOSIS — W19XXXA Unspecified fall, initial encounter: Secondary | ICD-10-CM

## 2015-09-03 DIAGNOSIS — N200 Calculus of kidney: Secondary | ICD-10-CM | POA: Diagnosis not present

## 2015-09-03 DIAGNOSIS — A0472 Enterocolitis due to Clostridium difficile, not specified as recurrent: Secondary | ICD-10-CM | POA: Diagnosis present

## 2015-09-03 DIAGNOSIS — I4891 Unspecified atrial fibrillation: Secondary | ICD-10-CM | POA: Diagnosis present

## 2015-09-03 DIAGNOSIS — I9589 Other hypotension: Secondary | ICD-10-CM

## 2015-09-03 DIAGNOSIS — R059 Cough, unspecified: Secondary | ICD-10-CM

## 2015-09-03 DIAGNOSIS — R55 Syncope and collapse: Secondary | ICD-10-CM | POA: Diagnosis not present

## 2015-09-03 DIAGNOSIS — E872 Acidosis: Secondary | ICD-10-CM | POA: Diagnosis present

## 2015-09-03 DIAGNOSIS — I159 Secondary hypertension, unspecified: Secondary | ICD-10-CM | POA: Diagnosis not present

## 2015-09-03 DIAGNOSIS — Z8669 Personal history of other diseases of the nervous system and sense organs: Secondary | ICD-10-CM | POA: Diagnosis not present

## 2015-09-03 DIAGNOSIS — Z87898 Personal history of other specified conditions: Secondary | ICD-10-CM

## 2015-09-03 DIAGNOSIS — R918 Other nonspecific abnormal finding of lung field: Secondary | ICD-10-CM | POA: Diagnosis not present

## 2015-09-03 DIAGNOSIS — J811 Chronic pulmonary edema: Secondary | ICD-10-CM | POA: Diagnosis not present

## 2015-09-03 DIAGNOSIS — K625 Hemorrhage of anus and rectum: Secondary | ICD-10-CM | POA: Diagnosis not present

## 2015-09-03 DIAGNOSIS — J438 Other emphysema: Secondary | ICD-10-CM | POA: Diagnosis not present

## 2015-09-03 DIAGNOSIS — R0600 Dyspnea, unspecified: Secondary | ICD-10-CM

## 2015-09-03 DIAGNOSIS — J181 Lobar pneumonia, unspecified organism: Secondary | ICD-10-CM | POA: Diagnosis not present

## 2015-09-03 HISTORY — DX: Obstructive sleep apnea (adult) (pediatric): G47.33

## 2015-09-03 HISTORY — DX: Encephalopathy, unspecified: G93.40

## 2015-09-03 LAB — I-STAT ARTERIAL BLOOD GAS, ED
Acid-base deficit: 6 mmol/L — ABNORMAL HIGH (ref 0.0–2.0)
Acid-base deficit: 3 mmol/L — ABNORMAL HIGH (ref 0.0–2.0)
Bicarbonate: 19 mEq/L — ABNORMAL LOW (ref 20.0–24.0)
Bicarbonate: 20.6 mEq/L (ref 20.0–24.0)
O2 Saturation: 89 %
O2 Saturation: 95 %
Patient temperature: 98.4
Patient temperature: 37.3
TCO2: 20 mmol/L (ref 0–100)
TCO2: 22 mmol/L (ref 0–100)
pCO2 arterial: 34.1 mmHg — ABNORMAL LOW (ref 35.0–45.0)
pCO2 arterial: 31.8 mmHg — ABNORMAL LOW (ref 35.0–45.0)
pH, Arterial: 7.355 (ref 7.350–7.450)
pH, Arterial: 7.421 (ref 7.350–7.450)
pO2, Arterial: 58 mmHg — ABNORMAL LOW (ref 80.0–100.0)
pO2, Arterial: 74 mmHg — ABNORMAL LOW (ref 80.0–100.0)

## 2015-09-03 LAB — BASIC METABOLIC PANEL
Anion gap: 16 — ABNORMAL HIGH (ref 5–15)
Anion gap: 13 (ref 5–15)
BUN: 130 mg/dL — ABNORMAL HIGH (ref 6–20)
BUN: 129 mg/dL — ABNORMAL HIGH (ref 6–20)
CO2: 19 mmol/L — ABNORMAL LOW (ref 22–32)
CO2: 20 mmol/L — ABNORMAL LOW (ref 22–32)
Calcium: 7.4 mg/dL — ABNORMAL LOW (ref 8.9–10.3)
Calcium: 7.5 mg/dL — ABNORMAL LOW (ref 8.9–10.3)
Chloride: 96 mmol/L — ABNORMAL LOW (ref 101–111)
Chloride: 98 mmol/L — ABNORMAL LOW (ref 101–111)
Creatinine, Ser: 5.65 mg/dL — ABNORMAL HIGH (ref 0.61–1.24)
Creatinine, Ser: 5.1 mg/dL — ABNORMAL HIGH (ref 0.61–1.24)
GFR calc Af Amer: 10 mL/min — ABNORMAL LOW (ref >60)
GFR calc Af Amer: 11 mL/min — ABNORMAL LOW (ref >60)
GFR calc non Af Amer: 9 mL/min — ABNORMAL LOW (ref >60)
GFR calc non Af Amer: 10 mL/min — ABNORMAL LOW (ref >60)
Glucose, Bld: 136 mg/dL — ABNORMAL HIGH (ref 65–99)
Glucose, Bld: 118 mg/dL — ABNORMAL HIGH (ref 65–99)
Potassium: 5 mmol/L (ref 3.5–5.1)
Potassium: 5.4 mmol/L — ABNORMAL HIGH (ref 3.5–5.1)
Sodium: 131 mmol/L — ABNORMAL LOW (ref 135–145)
Sodium: 131 mmol/L — ABNORMAL LOW (ref 135–145)

## 2015-09-03 LAB — PHOSPHORUS
Phosphorus: 6.5 mg/dL — ABNORMAL HIGH (ref 2.5–4.6)
Phosphorus: 6.5 mg/dL — ABNORMAL HIGH (ref 2.5–4.6)

## 2015-09-03 LAB — TECHNOLOGIST SMEAR REVIEW

## 2015-09-03 LAB — LACTIC ACID, PLASMA: Lactic Acid, Venous: 2.3 mmol/L (ref 0.5–2.0)

## 2015-09-03 LAB — CBC
HCT: 25.1 % — ABNORMAL LOW (ref 39.0–52.0)
Hemoglobin: 7.9 g/dL — ABNORMAL LOW (ref 13.0–17.0)
MCH: 28.2 pg (ref 26.0–34.0)
MCHC: 31.5 g/dL (ref 30.0–36.0)
MCV: 89.6 fL (ref 78.0–100.0)
Platelets: 354 10*3/uL (ref 150–400)
RBC: 2.8 MIL/uL — ABNORMAL LOW (ref 4.22–5.81)
RDW: 17.3 % — ABNORMAL HIGH (ref 11.5–15.5)
WBC: 11.3 10*3/uL — ABNORMAL HIGH (ref 4.0–10.5)

## 2015-09-03 LAB — MRSA PCR SCREENING: MRSA by PCR: NEGATIVE

## 2015-09-03 LAB — HEPATIC FUNCTION PANEL
ALT: 47 U/L (ref 17–63)
AST: 70 U/L — ABNORMAL HIGH (ref 15–41)
Albumin: 2.9 g/dL — ABNORMAL LOW (ref 3.5–5.0)
Alkaline Phosphatase: 55 U/L (ref 38–126)
Bilirubin, Direct: 0.3 mg/dL (ref 0.1–0.5)
Indirect Bilirubin: 0.6 mg/dL (ref 0.3–0.9)
Total Bilirubin: 0.9 mg/dL (ref 0.3–1.2)
Total Protein: 5.8 g/dL — ABNORMAL LOW (ref 6.5–8.1)

## 2015-09-03 LAB — DIFFERENTIAL
Basophils Absolute: 0 10*3/uL (ref 0.0–0.1)
Basophils Relative: 0 %
Eosinophils Absolute: 0 10*3/uL (ref 0.0–0.7)
Eosinophils Relative: 0 %
Lymphocytes Relative: 7 %
Lymphs Abs: 0.8 10*3/uL (ref 0.7–4.0)
Monocytes Absolute: 0.4 10*3/uL (ref 0.1–1.0)
Monocytes Relative: 4 %
Neutro Abs: 10 10*3/uL — ABNORMAL HIGH (ref 1.7–7.7)
Neutrophils Relative %: 89 %

## 2015-09-03 LAB — I-STAT TROPONIN, ED: Troponin i, poc: 0.05 ng/mL (ref 0.00–0.08)

## 2015-09-03 LAB — I-STAT CHEM 8, ED
BUN: 131 mg/dL — ABNORMAL HIGH (ref 6–20)
Calcium, Ion: 0.9 mmol/L — ABNORMAL LOW (ref 1.13–1.30)
Chloride: 95 mmol/L — ABNORMAL LOW (ref 101–111)
Creatinine, Ser: 5.2 mg/dL — ABNORMAL HIGH (ref 0.61–1.24)
Glucose, Bld: 110 mg/dL — ABNORMAL HIGH (ref 65–99)
HCT: 26 % — ABNORMAL LOW (ref 39.0–52.0)
Hemoglobin: 8.8 g/dL — ABNORMAL LOW (ref 13.0–17.0)
Potassium: 4.9 mmol/L (ref 3.5–5.1)
Sodium: 131 mmol/L — ABNORMAL LOW (ref 135–145)
TCO2: 18 mmol/L (ref 0–100)

## 2015-09-03 LAB — ABO/RH: ABO/RH(D): B POS

## 2015-09-03 LAB — GLUCOSE, CAPILLARY
Glucose-Capillary: 106 mg/dL — ABNORMAL HIGH (ref 65–99)
Glucose-Capillary: 125 mg/dL — ABNORMAL HIGH (ref 65–99)
Glucose-Capillary: 127 mg/dL — ABNORMAL HIGH (ref 65–99)
Glucose-Capillary: 92 mg/dL (ref 65–99)
Glucose-Capillary: 98 mg/dL (ref 65–99)

## 2015-09-03 LAB — RAPID URINE DRUG SCREEN, HOSP PERFORMED
Amphetamines: NOT DETECTED
Barbiturates: NOT DETECTED
Benzodiazepines: NOT DETECTED
Cocaine: NOT DETECTED
Opiates: POSITIVE — AB
Tetrahydrocannabinol: NOT DETECTED

## 2015-09-03 LAB — MAGNESIUM
Magnesium: 2.2 mg/dL (ref 1.7–2.4)
Magnesium: 2.1 mg/dL (ref 1.7–2.4)

## 2015-09-03 LAB — STREP PNEUMONIAE URINARY ANTIGEN: Strep Pneumo Urinary Antigen: POSITIVE — AB

## 2015-09-03 LAB — PROTIME-INR
INR: 2.72 — ABNORMAL HIGH (ref 0.00–1.49)
Prothrombin Time: 28.4 seconds — ABNORMAL HIGH (ref 11.6–15.2)

## 2015-09-03 LAB — TYPE AND SCREEN
ABO/RH(D): B POS
Antibody Screen: NEGATIVE

## 2015-09-03 LAB — APTT: aPTT: 38 seconds — ABNORMAL HIGH (ref 24–37)

## 2015-09-03 LAB — COMPREHENSIVE METABOLIC PANEL
ALT: 37 U/L (ref 17–63)
AST: 62 U/L — ABNORMAL HIGH (ref 15–41)
Albumin: 3.4 g/dL — ABNORMAL LOW (ref 3.5–5.0)
Alkaline Phosphatase: 64 U/L (ref 38–126)
Anion gap: 22 — ABNORMAL HIGH (ref 5–15)
BUN: 133 mg/dL — ABNORMAL HIGH (ref 6–20)
CO2: 16 mmol/L — ABNORMAL LOW (ref 22–32)
Calcium: 8.3 mg/dL — ABNORMAL LOW (ref 8.9–10.3)
Chloride: 97 mmol/L — ABNORMAL LOW (ref 101–111)
Creatinine, Ser: 5.4 mg/dL — ABNORMAL HIGH (ref 0.61–1.24)
GFR calc Af Amer: 11 mL/min — ABNORMAL LOW (ref >60)
GFR calc non Af Amer: 9 mL/min — ABNORMAL LOW (ref >60)
Glucose, Bld: 116 mg/dL — ABNORMAL HIGH (ref 65–99)
Potassium: 5.2 mmol/L — ABNORMAL HIGH (ref 3.5–5.1)
Sodium: 135 mmol/L (ref 135–145)
Total Bilirubin: 1.5 mg/dL — ABNORMAL HIGH (ref 0.3–1.2)
Total Protein: 6.3 g/dL — ABNORMAL LOW (ref 6.5–8.1)

## 2015-09-03 LAB — URINALYSIS, ROUTINE W REFLEX MICROSCOPIC
Bilirubin Urine: NEGATIVE
Glucose, UA: NEGATIVE mg/dL
Ketones, ur: NEGATIVE mg/dL
Nitrite: NEGATIVE
Protein, ur: >300 mg/dL — AB
Specific Gravity, Urine: 1.013 (ref 1.005–1.030)
pH: 5.5 (ref 5.0–8.0)

## 2015-09-03 LAB — URINE MICROSCOPIC-ADD ON

## 2015-09-03 LAB — HEMOGLOBIN AND HEMATOCRIT, BLOOD
HCT: 24.1 % — ABNORMAL LOW (ref 39.0–52.0)
Hemoglobin: 7.9 g/dL — ABNORMAL LOW (ref 13.0–17.0)

## 2015-09-03 LAB — CREATININE, URINE, RANDOM: Creatinine, Urine: 55.43 mg/dL

## 2015-09-03 LAB — CORTISOL: Cortisol, Plasma: 53.2 ug/dL

## 2015-09-03 LAB — CK: Total CK: 279 U/L (ref 49–397)

## 2015-09-03 LAB — TSH: TSH: 14.711 u[IU]/mL — ABNORMAL HIGH (ref 0.350–4.500)

## 2015-09-03 LAB — DIGOXIN LEVEL: Digoxin Level: 1.3 ng/mL (ref 0.8–2.0)

## 2015-09-03 LAB — PROCALCITONIN: Procalcitonin: 10.78 ng/mL

## 2015-09-03 LAB — TROPONIN I
Troponin I: 0.06 ng/mL — ABNORMAL HIGH (ref <0.031)
Troponin I: 0.07 ng/mL — ABNORMAL HIGH (ref <0.031)

## 2015-09-03 LAB — ETHANOL: Alcohol, Ethyl (B): <5 mg/dL (ref <5)

## 2015-09-03 MED ORDER — FAMOTIDINE 20 MG PO TABS: 20.0000 mg | ORAL_TABLET | Freq: Two times a day (BID) | ORAL | Status: DC

## 2015-09-03 MED ORDER — PRO-STAT SUGAR FREE PO LIQD
60.0000 mL | Freq: Three times a day (TID) | ORAL | Status: DC
  Administered 2015-09-03 – 2015-09-05 (×5): 60 mL
  Filled 2015-09-03 (×10): qty 60

## 2015-09-03 MED ORDER — PHENYLEPHRINE HCL 10 MG/ML IJ SOLN
0.0000 ug/min | INTRAVENOUS | Status: DC
  Administered 2015-09-03: 50 ug/min via INTRAVENOUS
  Administered 2015-09-03: 200 ug/min via INTRAVENOUS
  Administered 2015-09-03: 175 ug/min via INTRAVENOUS
  Filled 2015-09-03 (×5): qty 1

## 2015-09-03 MED ORDER — VITAL HIGH PROTEIN PO LIQD
1000.0000 mL | ORAL | Status: DC
  Administered 2015-09-03: 1000 mL
  Administered 2015-09-04 (×2)
  Administered 2015-09-04: 1000 mL
  Administered 2015-09-04: 04:00:00

## 2015-09-03 MED ORDER — FENTANYL BOLUS VIA INFUSION
25.0000 ug | INTRAVENOUS | Status: DC | PRN
  Filled 2015-09-03: qty 25

## 2015-09-03 MED ORDER — FENTANYL CITRATE (PF) 100 MCG/2ML IJ SOLN: 50.0000 ug | INTRAMUSCULAR | Status: DC | PRN

## 2015-09-03 MED ORDER — LEVOFLOXACIN IN D5W 750 MG/150ML IV SOLN
750.0000 mg | Freq: Once | INTRAVENOUS | Status: AC
  Administered 2015-09-03: 750 mg via INTRAVENOUS
  Filled 2015-09-03: qty 150

## 2015-09-03 MED ORDER — DEXTROSE 5 % IV SOLN
1.0000 g | Freq: Every day | INTRAVENOUS | Status: DC
  Administered 2015-09-03 – 2015-09-08 (×6): 1 g via INTRAVENOUS
  Filled 2015-09-03 (×6): qty 10

## 2015-09-03 MED ORDER — PROPOFOL 1000 MG/100ML IV EMUL
5.0000 ug/kg/min | Freq: Once | INTRAVENOUS | Status: AC
  Administered 2015-09-03: 5 ug/kg/min via INTRAVENOUS
  Filled 2015-09-03: qty 100

## 2015-09-03 MED ORDER — DOPAMINE-DEXTROSE 3.2-5 MG/ML-% IV SOLN
INTRAVENOUS | Status: AC
  Filled 2015-09-03: qty 250

## 2015-09-03 MED ORDER — ETOMIDATE 2 MG/ML IV SOLN
INTRAVENOUS | Status: AC
Start: 2015-09-03 — End: 2015-09-03
  Administered 2015-09-03: 20 mg
  Filled 2015-09-03: qty 10

## 2015-09-03 MED ORDER — FENTANYL CITRATE (PF) 100 MCG/2ML IJ SOLN: 50.0000 ug | Freq: Once | INTRAMUSCULAR | Status: DC

## 2015-09-03 MED ORDER — SODIUM CHLORIDE 0.9 % IV BOLUS (SEPSIS)
1000.0000 mL | Freq: Once | INTRAVENOUS | Status: AC
  Administered 2015-09-03: 1000 mL via INTRAVENOUS

## 2015-09-03 MED ORDER — DOCUSATE SODIUM 50 MG/5ML PO LIQD: 100.0000 mg | Freq: Two times a day (BID) | ORAL | Status: DC | PRN

## 2015-09-03 MED ORDER — VITAL HIGH PROTEIN PO LIQD: 1000.0000 mL | ORAL | Status: DC

## 2015-09-03 MED ORDER — DOPAMINE-DEXTROSE 3.2-5 MG/ML-% IV SOLN
5.0000 ug/kg/min | INTRAVENOUS | Status: DC
  Administered 2015-09-03 – 2015-09-04 (×2): 7 ug/kg/min via INTRAVENOUS
  Administered 2015-09-05: 8 ug/kg/min via INTRAVENOUS
  Administered 2015-09-06: 5 ug/kg/min via INTRAVENOUS
  Filled 2015-09-03 (×2): qty 250

## 2015-09-03 MED ORDER — CHLORHEXIDINE GLUCONATE 0.12% ORAL RINSE (MEDLINE KIT)
15.0000 mL | Freq: Two times a day (BID) | OROMUCOSAL | Status: DC
  Administered 2015-09-03 – 2015-09-05 (×5): 15 mL via OROMUCOSAL

## 2015-09-03 MED ORDER — PANTOPRAZOLE SODIUM 40 MG IV SOLR: 40.0000 mg | INTRAVENOUS | Status: DC

## 2015-09-03 MED ORDER — ATROPINE SULFATE 1 MG/ML IJ SOLN
1.0000 mg | Freq: Once | INTRAMUSCULAR | Status: AC
  Administered 2015-09-03: 1 mg via INTRAVENOUS

## 2015-09-03 MED ORDER — PANTOPRAZOLE SODIUM 40 MG IV SOLR
40.0000 mg | Freq: Two times a day (BID) | INTRAVENOUS | Status: DC
  Administered 2015-09-03 – 2015-09-08 (×11): 40 mg via INTRAVENOUS
  Filled 2015-09-03 (×11): qty 40

## 2015-09-03 MED ORDER — VANCOMYCIN HCL 10 G IV SOLR
2000.0000 mg | Freq: Once | INTRAVENOUS | Status: AC
  Administered 2015-09-03: 2000 mg via INTRAVENOUS
  Filled 2015-09-03: qty 2000

## 2015-09-03 MED ORDER — ETOMIDATE 2 MG/ML IV SOLN
20.0000 mg | Freq: Once | INTRAVENOUS | Status: AC
  Administered 2015-09-03: 20 mg via INTRAVENOUS

## 2015-09-03 MED ORDER — SODIUM CHLORIDE 0.9 % IV BOLUS (SEPSIS)
500.0000 mL | Freq: Once | INTRAVENOUS | Status: AC
  Administered 2015-09-03: 500 mL via INTRAVENOUS

## 2015-09-03 MED ORDER — HYDROCORTISONE NA SUCCINATE PF 100 MG IJ SOLR
50.0000 mg | Freq: Four times a day (QID) | INTRAMUSCULAR | Status: DC
  Administered 2015-09-03 – 2015-09-04 (×4): 50 mg via INTRAVENOUS
  Filled 2015-09-03 (×4): qty 2

## 2015-09-03 MED ORDER — FENTANYL CITRATE (PF) 100 MCG/2ML IJ SOLN
100.0000 ug | Freq: Once | INTRAMUSCULAR | Status: AC
  Administered 2015-09-03: 100 ug via INTRAVENOUS

## 2015-09-03 MED ORDER — HYDROCORTISONE NA SUCCINATE PF 100 MG IJ SOLR: 50.0000 mg | Freq: Four times a day (QID) | INTRAMUSCULAR | Status: DC

## 2015-09-03 MED ORDER — VANCOMYCIN HCL IN DEXTROSE 1-5 GM/200ML-% IV SOLN
1000.0000 mg | INTRAVENOUS | Status: DC
  Administered 2015-09-05: 1000 mg via INTRAVENOUS
  Filled 2015-09-03: qty 200

## 2015-09-03 MED ORDER — ANTISEPTIC ORAL RINSE SOLUTION (CORINZ)
7.0000 mL | OROMUCOSAL | Status: DC
  Administered 2015-09-03 – 2015-09-06 (×22): 7 mL via OROMUCOSAL

## 2015-09-03 MED ORDER — SUCCINYLCHOLINE CHLORIDE 20 MG/ML IJ SOLN
100.0000 mg | Freq: Once | INTRAMUSCULAR | Status: AC
  Administered 2015-09-03: 100 mg via INTRAVENOUS
  Filled 2015-09-03: qty 5

## 2015-09-03 MED ORDER — PANTOPRAZOLE SODIUM 40 MG PO PACK: 40.0000 mg | PACK | Freq: Every day | ORAL | Status: DC

## 2015-09-03 MED ORDER — NOREPINEPHRINE BITARTRATE 1 MG/ML IV SOLN
0.0000 ug/min | INTRAVENOUS | Status: DC
  Administered 2015-09-03: 30 ug/min via INTRAVENOUS
  Administered 2015-09-03: 40 ug/min via INTRAVENOUS
  Administered 2015-09-03: 20 ug/min via INTRAVENOUS
  Administered 2015-09-03: 2 ug/min via INTRAVENOUS
  Administered 2015-09-04: 5 ug/min via INTRAVENOUS
  Administered 2015-09-04: 20 ug/min via INTRAVENOUS
  Administered 2015-09-04: 13 ug/min via INTRAVENOUS
  Administered 2015-09-05: 4 ug/min via INTRAVENOUS
  Administered 2015-09-06: 1 ug/min via INTRAVENOUS
  Filled 2015-09-03 (×11): qty 4

## 2015-09-03 MED ORDER — LEVOFLOXACIN IN D5W 500 MG/100ML IV SOLN
500.0000 mg | INTRAVENOUS | Status: DC
Start: 2015-09-05 — End: 2015-09-06
  Administered 2015-09-05: 500 mg via INTRAVENOUS
  Filled 2015-09-03: qty 100

## 2015-09-03 MED ORDER — SODIUM CHLORIDE 0.9 % IV SOLN
25.0000 ug/h | INTRAVENOUS | Status: DC
  Administered 2015-09-03: 50 ug/h via INTRAVENOUS
  Administered 2015-09-04 (×2): 150 ug/h via INTRAVENOUS
  Administered 2015-09-05: 200 ug/h via INTRAVENOUS
  Filled 2015-09-03 (×4): qty 50

## 2015-09-03 MED ORDER — DOPAMINE-DEXTROSE 3.2-5 MG/ML-% IV SOLN
INTRAVENOUS | Status: AC
  Administered 2015-09-03: 5 ug/kg/min
  Filled 2015-09-03: qty 250

## 2015-09-03 MED ORDER — FENTANYL CITRATE (PF) 100 MCG/2ML IJ SOLN
INTRAMUSCULAR | Status: AC
  Filled 2015-09-03: qty 2

## 2015-09-03 MED ORDER — SODIUM POLYSTYRENE SULFONATE 15 GM/60ML PO SUSP
30.0000 g | Freq: Once | ORAL | Status: AC
  Administered 2015-09-03: 30 g
  Filled 2015-09-03: qty 120

## 2015-09-03 MED ORDER — MIDAZOLAM HCL 2 MG/2ML IJ SOLN: 1.0000 mg | INTRAMUSCULAR | Status: DC | PRN

## 2015-09-03 MED ORDER — SODIUM CHLORIDE 0.9 % IV SOLN
250.0000 mL | INTRAVENOUS | Status: DC | PRN
  Administered 2015-09-07: 250 mL via INTRAVENOUS

## 2015-09-03 MED ORDER — MIDAZOLAM HCL 2 MG/2ML IJ SOLN
1.0000 mg | INTRAMUSCULAR | Status: DC | PRN
  Administered 2015-09-03 (×2): 1 mg via INTRAVENOUS
  Filled 2015-09-03 (×2): qty 2

## 2015-09-03 MED FILL — Medication: Qty: 1 | Status: AC

## 2015-09-03 NOTE — Progress Notes (Signed)
Code Stroke called on 80 y.o male. LSN at 0150 walking inside home per wife, found at 0200 after a fall en route to bathroom unresponsive. Pt taken to [facility], BVM assist en route. Pt unresponsive with decorticate posturing while en route and upon arrival . NIHSS completed quickly before RSI medications given yielding 30.  Glucose 110. Emergently intubated upon arrival.  Per med list provided by EMS Pt takes xarelto daily. After stabilized and cleared per EDP, Pt brought to CT scan. Negative for acute bleed, CTA considered however due to abnormal BUN/CR unable. Code Stroke canceled per Dr. Nicole Kindred. Pt for admit per PCCM.

## 2015-09-03 NOTE — Progress Notes (Signed)
Pt. Was unconscious with no family in the waiting room. Prayed for pt. And left instructions with staff to call me if any family showed up.

## 2015-09-03 NOTE — ED Notes (Signed)
Admitting PA notified on pt.'s hypotension , no new order given .

## 2015-09-03 NOTE — Procedures (Signed)
Arterial Catheter Insertion Procedure Note NAJEE NEUGEBAUER [id number] [birth year]  Procedure: Insertion of Arterial Catheter  Indications: Blood pressure monitoring and Frequent blood sampling  Procedure Details Consent: Risks of procedure as well as the alternatives and risks of each were explained to the (patient/caregiver).  Consent for procedure obtained. Time Out: Verified patient identification, verified procedure, site/side was marked, verified correct patient position, special equipment/implants available, medications/allergies/relevent history reviewed, required imaging and test results available.  Performed  Maximum sterile technique was used including entire hospital policy dressing , full sheet etc, chrloraprep Skin prep: Chlorhexidine; local anesthetic administered 20 gauge catheter was inserted into right radial artery using the Seldinger technique.  Evaluation Blood flow good; BP tracing good. Complications: No apparent complications.   Raylene Miyamoto 09/03/2015   [place]  [place] Titus Mould, MD, [place] Pgr: [facility]

## 2015-09-03 NOTE — Progress Notes (Signed)
RT transported patient from ED to CT and back without any complications. 

## 2015-09-03 NOTE — Progress Notes (Signed)
Pharmacy Antibiotic Note  Vincent Mason is a 80 y.o. male admitted on 09/03/2015 after being found unresponsive.  Pharmacy has been consulted for vancomycin, levaquin dosing. PCT 10.78, SCR 1.57 > 5.4, eCrCl < 15 ml/min.  Plan: -Vancomycin 2 g load then 1g/24h -Levaquin 750 mg IV x1 then 500 mg/48h -Ceftriaxone 1 g IV q24h -Monitor renal function closely, cultures, may have to dose vanc off of random levels   Height: 5\' 8"  (172.7 cm) Weight: 217 lb 9.5 oz (98.7 kg) IBW/kg (Calculated) : 68.4  Temp (24hrs), Avg:98.9 F (37.2 C), Min:98.4 F (36.9 C), Max:99.5 F (37.5 C)   Recent Labs Lab 09/03/15 0300 09/03/15 0306  WBC  --  11.3*  CREATININE 5.20* 5.40*    Estimated Creatinine Clearance: 12.8 mL/min (by C-G formula based on Cr of 5.4).    Allergies  Allergen Reactions  . Decongestant  [Oxymetazoline]     Antimicrobials this admission: 4/11 vancomycin >>  4/11 levaquin >>  4/11 ceftriaxone >>  Dose adjustments this admission: NA  Microbiology results: 4/11 BCx:  4/11 UCx:   4/11 Sputum:   4/11 MRSA PCR:   Thank you for allowing pharmacy to be a part of this patient's care.  Harvel Quale 09/03/2015 9:53 AM

## 2015-09-03 NOTE — Procedures (Signed)
History: 81 year old male with altered mental status  Sedation: Fentanyl  Technique: This is a 21 channel routine scalp EEG performed at the bedside with bipolar and monopolar montages arranged in accordance to the international 10/20 system of electrode placement. One channel was dedicated to EKG recording.    Background: The background consists of generalized irregular delta and theta activities. There is no clear posterior dominant rhythm visualized.  Photic stimulation: Physiologic driving is not performed  EEG Abnormalities: 1) generalized irregular delta activity 2) absent PDR  Clinical Interpretation: This EEG is consistent with a generalized nonspecific cerebral dysfunction (encephalopathy). There was no seizure or seizure predisposition recorded on this study. Please note that a normal EEG does not preclude the possibility of epilepsy.   Roland Rack, MD [facility] [phone]  If 7pm- 7am, please page neurology on call as listed in [place].

## 2015-09-03 NOTE — Telephone Encounter (Signed)
LMOVM for pt to return call 

## 2015-09-03 NOTE — Progress Notes (Signed)
RT transported patient from ED to CT and then to 31M without any complications.

## 2015-09-03 NOTE — Progress Notes (Signed)
EEG Completed; Results Pending  

## 2015-09-03 NOTE — H&P (Signed)
PULMONARY / CRITICAL CARE MEDICINE   Name: Vincent Mason MRN: [id number] DOB: 1936-05-01    ADMISSION DATE:  09/03/2015 CONSULTATION DATE:  09/03/2015  REFERRING MD:  EDP  CHIEF COMPLAINT:  Unresponsiveness  HISTORY OF PRESENT ILLNESS:   Vincent Mason is a 80 year old male with a past medical history of A. Fib on Xarelto, Chronic Diastolic Heart Failure, Pulmonary HTN, COPD Unknown Stage, CKD Stage III, possible history of seizures presents to the ED after being found unresponsive at home. Wife reports that he was his normal self yesterday, woke up around 2 AM to use the bathroom and fell. Wife found him unresponsive and called EMS. EMS reports he had unequal pupils with left larger than right. He was intubated on arrival to the ED due to acute respiratory failure. Wife reports several episodes of staring spells and several episodes of unresponsiveness that have lasted 5-10 minutes the past several months. He was admitted 12/2014 with similar presentation and thought to be having seizures at that time and started on Trileptal with neurology follow up.   PAST MEDICAL HISTORY :  Past Medical History  Diagnosis Date  . Hypertension   . Hyperlipidemia   . COPD (chronic obstructive pulmonary disease) ([place])   . Kidney disease, chronic, stage III (moderate, EGFR 30-59 ml/min)   . Atrial fibrillation and flutter ([place])     continue routine follow up with Dr. Nehemiah Massed    PAST SURGICAL HISTORY: Past Surgical History  Procedure Laterality Date  . Cardiac surgery    . Hyperplastic colon polyp  02/2002    Sigmoid polyps  . Coronary artery bypass graft  10/1997  . History of cervical discectomy      C5-C6    Allergies  Allergen Reactions  . Decongestant  [Oxymetazoline]     No current facility-administered medications on file prior to encounter.   Current Outpatient Prescriptions on File Prior to Encounter  Medication Sig  . allopurinol (ZYLOPRIM) 300 MG tablet Take 1 tablet (300 mg total) by  mouth daily. (Patient not taking: Reported on 08/26/2015)  . benazepril (LOTENSIN) 20 MG tablet Take 1 tablet by mouth daily. Reported on 07/08/2015  . Coenzyme Q10 (COQ10) 200 MG CAPS Take 1 capsule by mouth daily.  . colchicine 0.6 MG tablet Take 1 tablet (0.6 mg total) by mouth daily as needed (for gout).  . diazepam (VALIUM) 5 MG tablet Take 1 tablet by mouth every 8 (eight) hours. Reported on 07/08/2015  . DIGOX 125 MCG tablet Take 1 tablet by mouth daily.  . Febuxostat (ULORIC) 80 MG TABS Take 1 tablet (80 mg total) by mouth daily.  . meclizine (ANTIVERT) 25 MG tablet Take 1 tablet (25 mg total) by mouth 3 (three) times daily as needed for dizziness.  . metolazone (ZAROXOLYN) 5 MG tablet Take 2 tablets by mouth daily.  . metoprolol succinate (TOPROL-XL) 100 MG 24 hr tablet TAKE ONE-HALF TABLET DAILY  . OXcarbazepine (TRILEPTAL) 150 MG tablet Take 1 tablet (150 mg total) by mouth 2 (two) times daily. Prescribed by Dr. Melrose Nakayama  . oxyCODONE (ROXICODONE) 15 MG immediate release tablet Take 1 tablet (15 mg total) by mouth every 4 (four) hours as needed for pain.  . potassium chloride SA (K-DUR,KLOR-CON) 20 MEQ tablet Take 2 tablets (40 mEq total) by mouth 2 (two) times daily.  . ranitidine (ZANTAC) 150 MG tablet Take 1 tablet by mouth 2 (two) times daily as needed. As needed for stomach burning  . rOPINIRole (REQUIP) 4 MG tablet  TAKE ONE TABLET BY MOUTH EVERY NIGHT AT BEDTIME FOR RESTLESS LEG SYNDROME  . rosuvastatin (CRESTOR) 40 MG tablet TAKE ONE (1) TABLET EACH DAY *GENERIC CRESTOR  . sildenafil (REVATIO) 20 MG tablet Take 2.5 tablets (50 mg total) by mouth as needed. Prescribed by Dr. Nehemiah Massed  . torsemide (DEMADEX) 20 MG tablet 2 tablets once or twice a day for swelling  . XARELTO 15 MG TABS tablet TAKE ONE TABLET BY MOUTH DAILY WITH EVENING MEAL  . zolpidem (AMBIEN) 5 MG tablet Take 1 tablet by mouth at bedtime. Reported on 07/08/2015    FAMILY HISTORY:  Unable to obtain family history given  intubation & sedation.  SOCIAL HISTORY: Social History  Substance Use Topics  . Smoking status: Former Research scientist (life sciences)  . Smokeless tobacco: None     Comment: Quit in 1990  . Alcohol Use: No    REVIEW OF SYSTEMS:  Unable to obtain as patient is intubated & sedated.  SUBJECTIVE:    VITAL SIGNS: BP 83/53 mmHg  Pulse 67  Temp(Src) 99.5 F (37.5 C) (Axillary)  Resp 18  Ht 5' 8"  (1.727 m)  SpO2 100%  HEMODYNAMICS:    VENTILATOR SETTINGS: Vent Mode:  [-] PRVC FiO2 (%):  [100 %] 100 % Set Rate:  [14 bmp] 14 bmp Vt Set:  [550 mL] 550 mL PEEP:  [10 cmH20] 10 cmH20  INTAKE / OUTPUT:    PHYSICAL EXAMINATION: General: Sedated. No acute distress. No family at bedside.  Integument:  Warm & dry. Extensive bruising over her gluteal area. Lower extremity epidermal lesions. Lymphatics:  No appreciated cervical or supraclavicular lymphadenoapthy. HEENT:  No scleral injection or icterus. Endotracheal tube in place.  Cardiovascular:  Regular rate. Bilateral lower extremity edema. No appreciable JVD.  Pulmonary:  Coarse breath sounds bilaterally. Symmetric chest wall rise on ventilator. Abdomen: Soft. Normal bowel sounds. Nondistended. Musculoskeletal:  Normal bulk and tone. No joint deformity or effusion appreciated. Neurological:  Spontaneously moving all 4 extremities. Pupils symmetric. Sedated. Psychiatric:  Unable to obtain given sedation.   LABS:  BMET  Recent Labs Lab 09/03/15 0300 09/03/15 0306  NA 131* 135  K 4.9 5.2*  CL 95* 97*  CO2  --  16*  BUN 131* 133*  CREATININE 5.20* 5.40*  GLUCOSE 110* 116*    Electrolytes  Recent Labs Lab 09/03/15 0306  CALCIUM 8.3*    CBC  Recent Labs Lab 09/03/15 0300 09/03/15 0306  WBC  --  11.3*  HGB 8.8* 7.9*  HCT 26.0* 25.1*  PLT  --  354    Coag's  Recent Labs Lab 09/03/15 0306  APTT 38*  INR 2.72*    Sepsis Markers No results for input(s): LATICACIDVEN, PROCALCITON, O2SATVEN in the last 168  hours.  ABG  Recent Labs Lab 09/03/15 0313 09/03/15 0413  PHART 7.355 7.421  PCO2ART 34.1* 31.8*  PO2ART 58.0* 74.0*    Liver Enzymes  Recent Labs Lab 09/03/15 0306  AST 62*  ALT 37  ALKPHOS 64  BILITOT 1.5*  ALBUMIN 3.4*    Cardiac Enzymes No results for input(s): TROPONINI, PROBNP in the last 168 hours.  Glucose No results for input(s): GLUCAP in the last 168 hours.  Imaging Ct Head Wo Contrast  09/03/2015  CLINICAL DATA:  Unresponsive. EXAM: CT HEAD WITHOUT CONTRAST CT CERVICAL SPINE WITHOUT CONTRAST TECHNIQUE: Multidetector CT imaging of the head and cervical spine was performed following the standard protocol without intravenous contrast. Multiplanar CT image reconstructions of the cervical spine were also generated. COMPARISON:  01/17/2015 FINDINGS: CT HEAD FINDINGS There is no intracranial hemorrhage, mass or evidence of acute infarction. There is no extra-axial fluid collection. There is moderate generalized atrophy. Possible remote lacunar infarctions in the left pons and left basal ganglia. No bony abnormality. CT CERVICAL SPINE FINDINGS The vertebral column, pedicles and facet articulations are intact. There is no evidence of acute fracture. No acute soft tissue abnormalities are evident. Mild motion degradation of the images.  Endotracheal tube noted. IMPRESSION: 1. No acute intracranial findings. Mild generalized atrophy and remote lacunar infarctions, unchanged. 2. Negative for acute cervical spine fracture. Mildly motion degraded study. Critical Value/emergent results were called by telephone at the time of interpretation on 09/03/2015 at 3:39 am to Dr. Wallie Char, who verbally acknowledged these results. Electronically Signed   By: Andreas Newport M.D.   On: 09/03/2015 03:40   Ct Cervical Spine Wo Contrast  09/03/2015  CLINICAL DATA:  Unresponsive. EXAM: CT HEAD WITHOUT CONTRAST CT CERVICAL SPINE WITHOUT CONTRAST TECHNIQUE: Multidetector CT imaging of the  head and cervical spine was performed following the standard protocol without intravenous contrast. Multiplanar CT image reconstructions of the cervical spine were also generated. COMPARISON:  01/17/2015 FINDINGS: CT HEAD FINDINGS There is no intracranial hemorrhage, mass or evidence of acute infarction. There is no extra-axial fluid collection. There is moderate generalized atrophy. Possible remote lacunar infarctions in the left pons and left basal ganglia. No bony abnormality. CT CERVICAL SPINE FINDINGS The vertebral column, pedicles and facet articulations are intact. There is no evidence of acute fracture. No acute soft tissue abnormalities are evident. Mild motion degradation of the images.  Endotracheal tube noted. IMPRESSION: 1. No acute intracranial findings. Mild generalized atrophy and remote lacunar infarctions, unchanged. 2. Negative for acute cervical spine fracture. Mildly motion degraded study. Critical Value/emergent results were called by telephone at the time of interpretation on 09/03/2015 at 3:39 am to Dr. Wallie Char, who verbally acknowledged these results. Electronically Signed   By: Andreas Newport M.D.   On: 09/03/2015 03:40   Dg Chest Port 1 View  09/03/2015  CLINICAL DATA:  Respiratory failure EXAM: PORTABLE CHEST 1 VIEW COMPARISON:  None. FINDINGS: The endotracheal tube is 5.2 cm above the carina. There is marked cardiomegaly, unchanged. There is extensive vascular and interstitial fullness which has worsened. There is worsening perihilar alveolar opacity, likely edema. No pneumothorax. No large effusions. IMPRESSION: Satisfactory ET tube position. Worsening changes of congestive heart failure with interstitial and alveolar edema. Electronically Signed   By: Andreas Newport M.D.   On: 09/03/2015 03:18   STUDIES:  09/03/2015 CT Head/Cervical Spine: No acute intracranial findings. Negative for acute cervical spine fracture.  09/03/2015 CXR: Worsening changes of congestive heart  failure with interstitial and alveolar edema  CULTURES: None  ANTIBIOTICS: None  SIGNIFICANT EVENTS: 4/11 - Admit  LINES/TUBES: 09/03/2015 PIV Left Hand, Left Wrist 09/03/2015 Foley 09/03/2015 NG/OG Tube 09/03/2015 Endotracheal Tube 8 mm  DISCUSSION: 80 year old male found unresponsive at home with acute respiratory failure, acute renal failure and acute anemia. History of possible seizure activity, chronic opioid/bzd dependence.   ASSESSMENT / PLAN:  PULMONARY A: Acute Hypoxic Respiratory Failure - Secondary to pulmonary edema. H/O COPD Questionable H/O Pulmonary HTN  P:   Full Vent Support Vent Bundle  CARDIOVASCULAR A:  Chronic Diastolic HF (per PCP note EF normal in 2015) Hypotension - Secondary to sedation. Tricuspid Insufficiency H/O Atrial fibrillation - chronic anticoagulation w/ Xarelto H/O CAD s/p CABG H/O HTN H/O Hyperlipidemia  P:  Checking  TTE Trending Troponin I q6hr Holding hypertensive medications - Lopressor & Lotensin Monitoring on telemetry Vitals per unit protocol Checking Digoxin level  RENAL A:   Acute on Chronic Renal Failure Stage III  P:   Ordering bilateral renal ultrasound Monitoring UOP with Foley Trending electrolytes & renal function daily Replacing electrolytes as indicated Holding nephrotoxic agents - Zaroxolyn & Lotensin  GASTROINTESTINAL A:   Questionable H/O GERD - On Zantac. Transaminitis - Mild.   P:   Checking Total CK Repeat LFTs at 1700 today Protonix IV daily NPO  HEMATOLOGIC A:   Extensive Bruising - Likely secondary to Xarelto & falls. Anemia - Likely secondary to blood loss. Coagulopathy - Secondary to Xarelto & acute on chronic renal failure. Leukocytosis - Sepsis versus stress response.  P:  Trending Hgb/Hct q12hr Type & Screen Trending Coags daily Trending cell counts daily w/ CBC Holding Xarelto  INFECTIOUS A:   Possible Sepsis Possible UTI - Few bacteria seen on UA  P:   Checking  Urine & Blood Cultures Tracheal aspirate & respiratory viral panel pcr Starting Rocephin Empirically Procalcitonin per algorithm  ENDOCRINE A:   No acute issues.  P:   Accu-Checks q4hr for 24 hours w/ parameters to notify MD  NEUROLOGIC A:   Acute Encephalopathy Recurrent Falls H/O Possible Seizures Chronic Narcotic & Benzodiazepine use  P:   MRI EEG Trileptal Level Neurology Consulted & following RASS goal: 0 to -1 Fentanyl gtt & IV prn Versed IV prn   FAMILY  Updated at bedside  - Inter-disciplinary family meet or Palliative Care meeting due by:  09/10/2015  I spent a total of 37 minutes of critical care time this morning caring for the patient and reviewing the patient's electronic medical record.  Sonia Baller Ashok Cordia, M.D. [facility] Pager:  [phone] After 3pm or if no response, call [phone] 09/03/2015, 5:01 AM

## 2015-09-03 NOTE — Telephone Encounter (Signed)
Pt wife, [name] is returning call.  CB#[phone]/MW

## 2015-09-03 NOTE — Progress Notes (Signed)
CRITICAL VALUE ALERT  Critical value received:  Lactic Acid 2.3  Date of notification:  09/03/15  Time of notification:  [id number]  Critical value read back:Yes.    Nurse who received alert:  Weston Brass, RN  MD notified (1st page):  Dr. Titus Mould  Time of first page:  Md on floor, notified in person at 1351  MD notified (2nd page):  Time of second page:  Responding MD:  Dr. Titus Mould  Time MD responded:  1351

## 2015-09-03 NOTE — ED Notes (Signed)
Pt. fell at home this evening and found unresponsive by wife approx. 0150 , received Narcan 2 mg prior to arrival , pt. is being bagged at arrival / unresponsive .

## 2015-09-03 NOTE — H&P (Signed)
PULMONARY / CRITICAL CARE MEDICINE   Name: Vincent Mason MRN: [id number] DOB: 08-Dec-1935    ADMISSION DATE:  09/03/2015 CONSULTATION DATE:  09/03/2015  REFERRING MD:  EDP  CHIEF COMPLAINT:  Unresponsiveness  HISTORY OF PRESENT ILLNESS:   Vincent Mason is a 80 year old male with a past medical history of A. Fib on Xarelto, Chronic Diastolic Heart Failure, Pulmonary HTN, COPD Unknown Stage, CKD Stage III, possible history of seizures presents to the ED after being found unresponsive at home. Wife reports that he was his normal self yesterday, woke up around 2 AM to use the bathroom and fell. Wife found him unresponsive and called EMS. EMS reports he had unequal pupils with left larger than right. He was intubated on arrival to the ED due to acute respiratory failure. Wife reports several episodes of staring spells and several episodes of unresponsiveness that have lasted 5-10 minutes the past several months. He was admitted 12/2014 with similar presentation and thought to be having seizures at that time and started on Trileptal with neurology follow up.   SUBJECTIVE:  Shock worsening Neo up  VITAL SIGNS: BP 80/41 mmHg  Pulse 31  Temp(Src) 98.6 F (37 C) (Core (Comment))  Resp 29  Ht 5\' 8"  (1.727 m)  Wt 98.7 kg (217 lb 9.5 oz)  BMI 33.09 kg/m2  SpO2 100%  HEMODYNAMICS:    VENTILATOR SETTINGS: Vent Mode:  [-] PRVC FiO2 (%):  [100 %] 100 % Set Rate:  [14 bmp] 14 bmp Vt Set:  [550 mL] 550 mL PEEP:  [8 cmH20-10 cmH20] 8 cmH20 Plateau Pressure:  [22 cmH20] 22 cmH20  INTAKE / OUTPUT: I/O last 3 completed shifts: In: 1254 [I.V.:1254] Out: 50 [Urine:50]  PHYSICAL EXAMINATION: General: vented Neuro: moves all ext purpsoeful, WD pain, pup left slug, rt brisk HEENT: ett, jvd wnl or low PULM: CTA reduced CV: s1 s2 irr 60 GI: soft, bs wnl, no r Extremities: no edema, lower ext ecchymosis  LABS:  BMET  Recent Labs Lab 09/03/15 0300 09/03/15 0306  NA 131* 135  K 4.9 5.2*  CL  95* 97*  CO2  --  16*  BUN 131* 133*  CREATININE 5.20* 5.40*  GLUCOSE 110* 116*    Electrolytes  Recent Labs Lab 09/03/15 0306  CALCIUM 8.3*    CBC  Recent Labs Lab 09/03/15 0300 09/03/15 0306  WBC  --  11.3*  HGB 8.8* 7.9*  HCT 26.0* 25.1*  PLT  --  354    Coag's  Recent Labs Lab 09/03/15 0306  APTT 38*  INR 2.72*    Sepsis Markers  Recent Labs Lab 09/03/15 0641  PROCALCITON 10.78    ABG  Recent Labs Lab 09/03/15 0313 09/03/15 0413  PHART 7.355 7.421  PCO2ART 34.1* 31.8*  PO2ART 58.0* 74.0*    Liver Enzymes  Recent Labs Lab 09/03/15 0306  AST 62*  ALT 37  ALKPHOS 64  BILITOT 1.5*  ALBUMIN 3.4*    Cardiac Enzymes  Recent Labs Lab 09/03/15 0641  TROPONINI 0.06*    Glucose  Recent Labs Lab 09/03/15 0903  GLUCAP 106*    Imaging Ct Abdomen Pelvis Wo Contrast  09/03/2015  CLINICAL DATA:  Unresponsive. EXAM: CT ABDOMEN AND PELVIS WITHOUT CONTRAST TECHNIQUE: Multidetector CT imaging of the abdomen and pelvis was performed following the standard protocol without IV contrast. COMPARISON:  None. FINDINGS: Dense consolidation in both lung bases with air bronchograms. This could represent pneumonia, aspiration, hemorrhage. There are unremarkable unenhanced appearances of the liver,  gallbladder, bile ducts, pancreas, spleen, adrenals and kidneys. Renal vascular calcifications are present. There are extensive atherosclerotic calcifications of the normal caliber aorta. There is a nasogastric tube extending into the stomach. Bowel is otherwise unremarkable. No bowel obstruction. No extraluminal air. No acute inflammatory changes are evident in the abdomen or pelvis. There is no ascites. No significant musculoskeletal lesion is evident. IMPRESSION: 1. No acute findings are evident in the abdomen or pelvis. 2. Dense consolidation in both lung bases Electronically Signed   By: Andreas Newport M.D.   On: 09/03/2015 06:05   Ct Head Wo  Contrast  09/03/2015  CLINICAL DATA:  Unresponsive. EXAM: CT HEAD WITHOUT CONTRAST CT CERVICAL SPINE WITHOUT CONTRAST TECHNIQUE: Multidetector CT imaging of the head and cervical spine was performed following the standard protocol without intravenous contrast. Multiplanar CT image reconstructions of the cervical spine were also generated. COMPARISON:  01/17/2015 FINDINGS: CT HEAD FINDINGS There is no intracranial hemorrhage, mass or evidence of acute infarction. There is no extra-axial fluid collection. There is moderate generalized atrophy. Possible remote lacunar infarctions in the left pons and left basal ganglia. No bony abnormality. CT CERVICAL SPINE FINDINGS The vertebral column, pedicles and facet articulations are intact. There is no evidence of acute fracture. No acute soft tissue abnormalities are evident. Mild motion degradation of the images.  Endotracheal tube noted. IMPRESSION: 1. No acute intracranial findings. Mild generalized atrophy and remote lacunar infarctions, unchanged. 2. Negative for acute cervical spine fracture. Mildly motion degraded study. Critical Value/emergent results were called by telephone at the time of interpretation on 09/03/2015 at 3:39 am to Dr. Wallie Char, who verbally acknowledged these results. Electronically Signed   By: Andreas Newport M.D.   On: 09/03/2015 03:40   Ct Cervical Spine Wo Contrast  09/03/2015  CLINICAL DATA:  Unresponsive. EXAM: CT HEAD WITHOUT CONTRAST CT CERVICAL SPINE WITHOUT CONTRAST TECHNIQUE: Multidetector CT imaging of the head and cervical spine was performed following the standard protocol without intravenous contrast. Multiplanar CT image reconstructions of the cervical spine were also generated. COMPARISON:  01/17/2015 FINDINGS: CT HEAD FINDINGS There is no intracranial hemorrhage, mass or evidence of acute infarction. There is no extra-axial fluid collection. There is moderate generalized atrophy. Possible remote lacunar infarctions in  the left pons and left basal ganglia. No bony abnormality. CT CERVICAL SPINE FINDINGS The vertebral column, pedicles and facet articulations are intact. There is no evidence of acute fracture. No acute soft tissue abnormalities are evident. Mild motion degradation of the images.  Endotracheal tube noted. IMPRESSION: 1. No acute intracranial findings. Mild generalized atrophy and remote lacunar infarctions, unchanged. 2. Negative for acute cervical spine fracture. Mildly motion degraded study. Critical Value/emergent results were called by telephone at the time of interpretation on 09/03/2015 at 3:39 am to Dr. Wallie Char, who verbally acknowledged these results. Electronically Signed   By: Andreas Newport M.D.   On: 09/03/2015 03:40   Dg Chest Port 1 View  09/03/2015  CLINICAL DATA:  Respiratory failure EXAM: PORTABLE CHEST 1 VIEW COMPARISON:  None. FINDINGS: The endotracheal tube is 5.2 cm above the carina. There is marked cardiomegaly, unchanged. There is extensive vascular and interstitial fullness which has worsened. There is worsening perihilar alveolar opacity, likely edema. No pneumothorax. No large effusions. IMPRESSION: Satisfactory ET tube position. Worsening changes of congestive heart failure with interstitial and alveolar edema. Electronically Signed   By: Andreas Newport M.D.   On: 09/03/2015 03:18   STUDIES:  09/03/2015 CT Head/Cervical Spine: No acute intracranial findings.  Negative for acute cervical spine fracture.  09/03/2015 CXR: Worsening changes of congestive heart failure with interstitial and alveolar edema 4/11 ct abdo/pelvis>>> neg acute 4/11MRI>>> 4/11 EEG>>>  CULTURES: None  ANTIBIOTICS: None  SIGNIFICANT EVENTS: 4/11 - Admit 4/11 shock, neo rising  LINES/TUBES: 09/03/2015 PIV Left Hand, Left Wrist 09/03/2015 Foley 09/03/2015 NG/OG Tube 09/03/2015 Endotracheal Tube 8 mm  DISCUSSION: 80 year old male found unresponsive at home with acute respiratory failure,  acute renal failure and acute anemia. History of possible seizure activity, chronic opioid/bzd dependence.   ASSESSMENT / PLAN:  PULMONARY A: Acute Hypoxic Respiratory Failure - dense bilateral infiltrates H/O COPD Questionable H/O Pulmonary HTN ASP PNA? P:   ABg reviewed, keep same mV Peep 8, 100%, likely needs peep 10 to recruit to get to 50% goal Sat goal 94% in setting pulm htn pcxr in am   CARDIOVASCULAR A:  Chronic Diastolic HF (per PCP note EF normal in 2015) Hypotension - Secondary to sedation, r/o sirs / sepsis Tricuspid Insufficiency H/O Atrial fibrillation - chronic anticoagulation w/ Xarelto H/O CAD s/p CABG H/O HTN H/O Hyperlipidemia  P:  Checking TTE Trop neg, dc further Need line, will place, will assess cvp - sepsis appearing now sirs Transition off neo as max 200 and remain slow, to levophed Dig level pending, HR 60 Cortisol then stress roids  RENAL A:   Acute on Chronic Renal Failure Stage III AG acidosis P:   Pending renal ultrasound bmet for K in pm  Get lactic acid  GASTROINTESTINAL A:   Questionable H/O GERD - On Zantac. Transaminitis - Mild.  R/o gastric stress on xarelto P:   Checking Total CK Repeat LFTs at 1700 today Protonix IV to bid Cbc in pm  NPO Post MRI, start TF  HEMATOLOGIC A:   Extensive Bruising - Likely secondary to Xarelto & falls. Anemia - Likely secondary to blood loss. Coagulopathy - Secondary to Xarelto & acute on chronic renal failure. Leukocytosis - Sepsis versus stress response.  P:  Trending Hgb/Hct q12hr Type & Screen Trending CBC Holding Xarelto  INFECTIOUS A:   Septic shock, PNA asp? atypical Possible UTI - Few bacteria seen on UA  P:   Add vancomycin Maintain ceftraixone, from home Add atypical levofloxacin Procalcitonin per algorithm  ENDOCRINE A:   R/o rel ai  P:   Accu-Checks q4hr for 24 hours w/ parameters to notify MD Cortisol then empiric steroids  NEUROLOGIC A:   Acute  Encephalopathy Recurrent Falls H/O Possible Seizures Chronic Narcotic & Benzodiazepine use  P:   MRI EEG Trileptal Level Neurology following RASS goal: 0 to -1 Fentanyl gtt & IV prn Versed IV prn - dc   FAMILY  None present  - Inter-disciplinary family meet or Palliative Care meeting due by:  09/10/2015  Ccm time 50 min   Lavon Paganini. Titus Mould, MD, [place] Pgr: [facility]

## 2015-09-03 NOTE — Progress Notes (Signed)
eLink Physician-Brief Progress Note Patient Name: TACOREY COTRONE DOB: [birth year] MRN: [id number]   Date of Service  09/03/2015  HPI/Events of Note  K elevated  eICU Interventions  30g kayexalate ordered.      Intervention Category Minor Interventions: Electrolytes abnormality - evaluation and management  Laverle Hobby 09/03/2015, 8:26 PM

## 2015-09-03 NOTE — Care Management Note (Signed)
Case Management Note  Patient Details  Name: Vincent Mason MRN: [id number] Date of Birth: 03/05/1936  Subjective/Objective:       Adm w encephalopathy, on ventilator             Action/Plan: lives w wife, pcp dr Risk manager  Expected Discharge Date:                  Expected Discharge Plan:  [place]  In-House Referral:     Discharge planning Services     Post Acute Care Choice:    Choice offered to:     DME Arranged:    DME Agency:     HH Arranged:    [place] Agency:     Status of Service:  In process, will continue to follow  Medicare Important Message Given:    Date Medicare IM Given:    Medicare IM give by:    Date Additional Medicare IM Given:    Additional Medicare Important Message give by:     If discussed at [place] of Stay Meetings, dates discussed:    Additional Comments: ur review done  Lacretia Leigh, RN 09/03/2015, 7:30 AM

## 2015-09-03 NOTE — ED Notes (Signed)
Report given to [facility] RN at [facility] unit nurse .

## 2015-09-03 NOTE — Consult Note (Signed)
Admission H&P    Chief Complaint: Unresponsive.  HPI: Vincent Mason is an 80 y.o. male with a history of hypertension, hyperlipidemia, COPD, chronic kidney disease and atrial fibrillation on Xaralto, brought to the emergency room by EMS after being found unresponsive at home by family. He was last known well at 1:50 AM. He was noted by EMS to have unequal pupils with the left larger than right and was exhibiting decerebrate posturing, in addition to being unresponsive. He was afebrile in ED. He was intubated and placed on mechanical ventilation due to acute respiratory failure. CT scan of his head was obtained which showed no acute intracranial abnormality including no signs of intracerebral hemorrhage. INR was 2.72. Serum creatinine was 5.2. He continued to exhibit tachypnea following intubation with respiratory rate in the low to mid 30s. No frank seizure activity was described at any time. Pupils returned to being equal, but didn't react to light. He arrived in code stroke status, which was subsequently canceled.  Past Medical History  Diagnosis Date  . Hypertension   . Hyperlipidemia   . COPD (chronic obstructive pulmonary disease) ([place])   . Kidney disease, chronic, stage III (moderate, EGFR 30-59 ml/min)   . Atrial fibrillation and flutter ([place])     continue routine follow up with Dr. Nehemiah Massed    Past Surgical History  Procedure Laterality Date  . Cardiac surgery    . Hyperplastic colon polyp  02/2002    Sigmoid polyps  . Coronary artery bypass graft  10/1997  . History of cervical discectomy      C5-C6    No family history on file. Social History:  reports that he has quit smoking. He does not have any smokeless tobacco history on file. He reports that he does not drink alcohol or use illicit drugs.  Allergies:  Allergies  Allergen Reactions  . Decongestant  [Oxymetazoline]     Medications: Patient's preadmission medications were reviewed by me.  ROS: Unavailable, as  patient was unconscious.  Physical Examination: Blood pressure 128/95, pulse 88, temperature 98.4 F (36.9 C), temperature source Axillary, resp. rate 36, height 5' 8" (1.727 m), SpO2 94 %.  HEENT-  Normocephalic, no lesions, without obvious abnormality.  Normal external eye and conjunctiva.  Normal TM's bilaterally.  Normal auditory canals and external ears. Normal external nose, mucus membranes and septum.  Normal pharynx. Neck supple with no masses, nodes, nodules or enlargement. Cardiovascular - regular rate and rhythm, S1, S2 normal, no murmur, click, rub or gallop Lungs - tachypnea with coarse rhonchi diffusely Abdomen - soft, non-tender; bowel sounds normal; no masses,  no organomegaly Extremities - moderately severe edema of legs and feet, with ulceration of right leg anteriorly.  Neurologic Examination: Patient was unresponsive, including a noxious stimuli. He was breathing rapidly and was exhibiting decerebrate posturing-like movements of upper extremities with inspiration. Pupils were equal at 4 mm each and did not react to light. Extraocular movements were full on right left gaze with roving conjugate eye movements. Face was symmetrical. Muscle tone was flaccid throughout except with episodes of stiffening associated with respiratory movements. Deep tendon reflexes were 2+ and symmetrical. Right plantar responses extensor on the left response was mute.  Results for orders placed or performed during the hospital encounter of 09/03/15 (from the past 48 hour(s))  I-stat troponin, ED (not at [facility], [facility])     Status: None   Collection Time: 09/03/15  2:59 AM  Result Value Ref Range   Troponin i, poc 0.05  0.00 - 0.08 ng/mL   Comment 3            Comment: Due to the release kinetics of cTnI, a negative result within the first hours of the onset of symptoms does not rule out myocardial infarction with certainty. If myocardial infarction is still suspected, repeat the test at  appropriate intervals.   I-Stat Chem 8, ED  (not at [facility], [facility])     Status: Abnormal   Collection Time: 09/03/15  3:00 AM  Result Value Ref Range   Sodium 131 (L) 135 - 145 mmol/L   Potassium 4.9 3.5 - 5.1 mmol/L   Chloride 95 (L) 101 - 111 mmol/L   BUN 131 (H) 6 - 20 mg/dL   Creatinine, Ser 5.20 (H) 0.61 - 1.24 mg/dL   Glucose, Bld 110 (H) 65 - 99 mg/dL   Calcium, Ion 0.90 (L) 1.13 - 1.30 mmol/L   TCO2 18 0 - 100 mmol/L   Hemoglobin 8.8 (L) 13.0 - 17.0 g/dL   HCT 26.0 (L) 39.0 - 52.0 %  Protime-INR     Status: Abnormal   Collection Time: 09/03/15  3:06 AM  Result Value Ref Range   Prothrombin Time 28.4 (H) 11.6 - 15.2 seconds   INR 2.72 (H) 0.00 - 1.49  APTT     Status: Abnormal   Collection Time: 09/03/15  3:06 AM  Result Value Ref Range   aPTT 38 (H) 24 - 37 seconds    Comment:        IF BASELINE aPTT IS ELEVATED, SUGGEST PATIENT RISK ASSESSMENT BE USED TO DETERMINE APPROPRIATE ANTICOAGULANT THERAPY.   CBC     Status: Abnormal   Collection Time: 09/03/15  3:06 AM  Result Value Ref Range   WBC 11.3 (H) 4.0 - 10.5 K/uL   RBC 2.80 (L) 4.22 - 5.81 MIL/uL   Hemoglobin 7.9 (L) 13.0 - 17.0 g/dL   HCT 25.1 (L) 39.0 - 52.0 %   MCV 89.6 78.0 - 100.0 fL   MCH 28.2 26.0 - 34.0 pg   MCHC 31.5 30.0 - 36.0 g/dL   RDW 17.3 (H) 11.5 - 15.5 %   Platelets 354 150 - 400 K/uL  Differential     Status: Abnormal   Collection Time: 09/03/15  3:06 AM  Result Value Ref Range   Neutrophils Relative % 89 %   Neutro Abs 10.0 (H) 1.7 - 7.7 K/uL   Lymphocytes Relative 7 %   Lymphs Abs 0.8 0.7 - 4.0 K/uL   Monocytes Relative 4 %   Monocytes Absolute 0.4 0.1 - 1.0 K/uL   Eosinophils Relative 0 %   Eosinophils Absolute 0.0 0.0 - 0.7 K/uL   Basophils Relative 0 %   Basophils Absolute 0.0 0.0 - 0.1 K/uL  I-Stat arterial blood gas, ED     Status: Abnormal   Collection Time: 09/03/15  3:13 AM  Result Value Ref Range   pH, Arterial 7.355 7.350 - 7.450   pCO2 arterial 34.1 (L) 35.0 - 45.0 mmHg    pO2, Arterial 58.0 (L) 80.0 - 100.0 mmHg   Bicarbonate 19.0 (L) 20.0 - 24.0 mEq/L   TCO2 20 0 - 100 mmol/L   O2 Saturation 89.0 %   Acid-base deficit 6.0 (H) 0.0 - 2.0 mmol/L   Patient temperature 98.4 F    Collection site RADIAL, ALLEN'S TEST ACCEPTABLE    Drawn by RT    Sample type ARTERIAL    Ct Head Wo Contrast  09/03/2015  CLINICAL DATA:  Unresponsive. EXAM: CT HEAD WITHOUT CONTRAST CT CERVICAL SPINE WITHOUT CONTRAST TECHNIQUE: Multidetector CT imaging of the head and cervical spine was performed following the standard protocol without intravenous contrast. Multiplanar CT image reconstructions of the cervical spine were also generated. COMPARISON:  01/17/2015 FINDINGS: CT HEAD FINDINGS There is no intracranial hemorrhage, mass or evidence of acute infarction. There is no extra-axial fluid collection. There is moderate generalized atrophy. Possible remote lacunar infarctions in the left pons and left basal ganglia. No bony abnormality. CT CERVICAL SPINE FINDINGS The vertebral column, pedicles and facet articulations are intact. There is no evidence of acute fracture. No acute soft tissue abnormalities are evident. Mild motion degradation of the images.  Endotracheal tube noted. IMPRESSION: 1. No acute intracranial findings. Mild generalized atrophy and remote lacunar infarctions, unchanged. 2. Negative for acute cervical spine fracture. Mildly motion degraded study. Critical Value/emergent results were called by telephone at the time of interpretation on 09/03/2015 at 3:39 am to Dr. Wallie Char, who verbally acknowledged these results. Electronically Signed   By: Andreas Newport M.D.   On: 09/03/2015 03:40   Ct Cervical Spine Wo Contrast  09/03/2015  CLINICAL DATA:  Unresponsive. EXAM: CT HEAD WITHOUT CONTRAST CT CERVICAL SPINE WITHOUT CONTRAST TECHNIQUE: Multidetector CT imaging of the head and cervical spine was performed following the standard protocol without intravenous contrast.  Multiplanar CT image reconstructions of the cervical spine were also generated. COMPARISON:  01/17/2015 FINDINGS: CT HEAD FINDINGS There is no intracranial hemorrhage, mass or evidence of acute infarction. There is no extra-axial fluid collection. There is moderate generalized atrophy. Possible remote lacunar infarctions in the left pons and left basal ganglia. No bony abnormality. CT CERVICAL SPINE FINDINGS The vertebral column, pedicles and facet articulations are intact. There is no evidence of acute fracture. No acute soft tissue abnormalities are evident. Mild motion degradation of the images.  Endotracheal tube noted. IMPRESSION: 1. No acute intracranial findings. Mild generalized atrophy and remote lacunar infarctions, unchanged. 2. Negative for acute cervical spine fracture. Mildly motion degraded study. Critical Value/emergent results were called by telephone at the time of interpretation on 09/03/2015 at 3:39 am to Dr. Wallie Char, who verbally acknowledged these results. Electronically Signed   By: Andreas Newport M.D.   On: 09/03/2015 03:40   Dg Chest Port 1 View  09/03/2015  CLINICAL DATA:  Respiratory failure EXAM: PORTABLE CHEST 1 VIEW COMPARISON:  None. FINDINGS: The endotracheal tube is 5.2 cm above the carina. There is marked cardiomegaly, unchanged. There is extensive vascular and interstitial fullness which has worsened. There is worsening perihilar alveolar opacity, likely edema. No pneumothorax. No large effusions. IMPRESSION: Satisfactory ET tube position. Worsening changes of congestive heart failure with interstitial and alveolar edema. Electronically Signed   By: Andreas Newport M.D.   On: 09/03/2015 03:18    Assessment/Plan 80 year old man presenting with unresponsive state and acute respiratory failure, as well as acute renal failure. Patient had no signs of an acute intracranial abnormality, including no signs of increased intracranial pressure, nor intracranial hemorrhage.  Anoxic brain injury cannot be ruled out at this point. No seizure activity was described, and patient showed no clinical indications of seizure manifestations.  Recommendations: 1. No contraindications to continued anticoagulation 2. MRI of the brain without contrast to rule out possible acute stroke 3. EEG, routine adult study, to assess severity of encephalopathy as well as rule out possible short activity.  We will continue to follow this patient with you.  C.R. Nicole Kindred, [place] [facility] Neurohospilalist [phone]  09/03/2015, 3:43 AM

## 2015-09-03 NOTE — Consult Note (Addendum)
WOC wound consult note Reason for Consult: Consult requested for bilat legs.   Wound type: Patchy areas of partial thickness stasis ulcers to bilat anterior calves Measurement: Left anterior calf  3X3X.1cm, dry yellow wound bed, no odor, scant amt yellow drainage.  4X2X.1cm to left inner calf; 4X2X.1cm, red and moist; appears to be previous blister which has ruptured.  Small amt yellow drainage. Right anterior calf 5X5X.1cm, yellow moist wound bed, mod amt yellow drainage, no odor. Periwound: Generalized edema to bilat legs Dressing procedure/placement/frequency: Foam dressings to absorb drainage and promote healing. Please re-consult if further assistance is needed.  Thank-you,  Julien Girt MSN, [place], [place], [place], [place]

## 2015-09-03 NOTE — ED Notes (Signed)
Pt. Intubated by EDP using #8 taped at 25 -lip.

## 2015-09-03 NOTE — ED Notes (Signed)
Patient transported to CT scan . 

## 2015-09-03 NOTE — Procedures (Signed)
Central Venous Catheter Insertion Procedure Note TYRUS SINGLETON [id number] 1935/12/09  Procedure: Insertion of Central Venous Catheter Indications: Assessment of intravascular volume, Drug and/or fluid administration and Frequent blood sampling  Procedure Details Consent: Risks of procedure as well as the alternatives and risks of each were explained to the (patient/caregiver).  Consent for procedure obtained. Time Out: Verified patient identification, verified procedure, site/side was marked, verified correct patient position, special equipment/implants available, medications/allergies/relevent history reviewed, required imaging and test results available.  Performed  Maximum sterile technique was used including antiseptics, cap, gloves, gown, hand hygiene, mask and sheet. Skin prep: Chlorhexidine; local anesthetic administered A antimicrobial bonded/coated triple lumen catheter was placed in the right internal jugular vein using the Seldinger technique.  Evaluation Blood flow good Complications: No apparent complications Patient did tolerate procedure well. Chest X-ray ordered to verify placement.  CXR: pending.  Raylene Miyamoto 09/03/2015, 11:22 AM stablized neck during [place]  Rodneisha Bonnet J. Titus Mould, MD, [place] Pgr: [facility]

## 2015-09-03 NOTE — Progress Notes (Signed)
Sputum sample obtained and sent to lab by RT. 

## 2015-09-03 NOTE — Progress Notes (Signed)
RT assisted ED MD with intubation. Bilateral breath sounds present. ETCO2 positive color change. CXR and ABG pending.

## 2015-09-03 NOTE — ED Provider Notes (Signed)
CSN: [id number]     Arrival date & time 09/03/15  0250 History  By signing my name below, I, Evelene Croon, attest that this documentation has been prepared under the direction and in the presence of Orpah Greek, MD . Electronically Signed: Evelene Croon, Scribe. 09/03/2015. 3:11 AM.    Chief Complaint  Patient presents with  . Loss of Consciousness   LEVEL 5 CAVEAT DUE TO ACUITY OF MEDICAL CONDITION  The history is provided by the EMS personnel. No language interpreter was used.    HPI Comments:  Vincent Mason is a 80 y.o. male with a history of COPD and AFIB, who presents to the Emergency Department via EMS unresponsive. Per EMS, pt got out of bed to go to the bathroom when he fell and lost consciousness ~ 43mn- 1 hr PTA . His wife called EMS. Upon their arrival to the pt he had pinpoint pupils and was given 2 mg Narcan. EMS placed a 20 gauge IV on the left hand. His BP en route 125/78. EMS also notes pt fell a few days ago.  Pt is currently on Xarelto.   Past Medical History  Diagnosis Date  . Hypertension   . Hyperlipidemia   . COPD (chronic obstructive [facility] disease) [facility]   . Kidney disease, chronic, stage III (moderate, EGFR 30-59 ml/min)   . [facility] and [place] [place]     continue routine follow up with Dr. KNehemiah Massed  Past Surgical History  Procedure Laterality Date  . Cardiac surgery    . Hyperplastic colon polyp  02/2002    Sigmoid polyps  . Coronary artery bypass graft  10/1997  . History of cervical discectomy      C5-C6   No family history on file. Social History  Substance Use Topics  . Smoking status: Former SResearch scientist (life sciences) . Smokeless tobacco: None     Comment: Quit in 1990  . Alcohol Use: No    Review of Systems  Unable to perform ROS: Acuity of condition    Allergies  Decongestant   Home Medications   Prior to Admission medications   Medication Sig Start Date End Date Taking? Authorizing Provider  benazepril (LOTENSIN) 20 MG  tablet Take 1 tablet by mouth daily. Reported on 07/08/2015 02/12/14  Yes Historical Provider, MD  Coenzyme Q10 (COQ10) 200 MG CAPS Take 1 capsule by mouth daily.   Yes Historical Provider, MD  colchicine 0.6 MG tablet Take 1 tablet (0.6 mg total) by mouth daily as needed (for gout). 08/23/15  Yes DBirdie Sons MD  diazepam (VALIUM) 5 MG tablet Take 1 tablet by mouth every 8 (eight) hours. Reported on 07/08/2015 08/11/11  Yes Historical Provider, MD  [place]125 MCG tablet Take 1 tablet by mouth daily. 12/31/14  Yes Historical Provider, MD  Febuxostat (ULORIC) 80 MG TABS Take 1 tablet (80 mg total) by mouth daily. 08/20/15  Yes DBirdie Sons MD  meclizine (ANTIVERT) 25 MG tablet Take 1 tablet (25 mg total) by mouth 3 (three) times daily as needed for dizziness. 07/08/15  Yes DBirdie Sons MD  metolazone (ZAROXOLYN) 5 MG tablet Take 2 tablets by mouth daily. 01/12/15  Yes Historical Provider, MD  metoprolol succinate (TOPROL-XL) 100 MG 24 hr tablet TAKE ONE-HALF TABLET DAILY 03/31/15  Yes DBirdie Sons MD  OXcarbazepine (TRILEPTAL) 150 MG tablet Take 1 tablet (150 mg total) by mouth 2 (two) times daily. Prescribed by Dr. PMelrose Nakayama4/4/17  Yes DBirdie Sons MD  oxyCODONE (ROXICODONE) 15 MG immediate release tablet Take 1 tablet (15 mg total) by mouth every 4 (four) hours as needed for pain. 08/16/15  Yes Birdie Sons, MD  potassium chloride SA (K-DUR,KLOR-CON) 20 MEQ tablet Take 2 tablets (40 mEq total) by mouth 2 (two) times daily. 07/10/15  Yes Birdie Sons, MD  ranitidine (ZANTAC) 150 MG tablet Take 1 tablet by mouth 2 (two) times daily as needed. As needed for stomach burning 08/11/13  Yes Historical Provider, MD  rOPINIRole (REQUIP) 4 MG tablet TAKE ONE TABLET BY MOUTH EVERY NIGHT AT BEDTIME FOR RESTLESS LEG SYNDROME 07/15/15  Yes Birdie Sons, MD  rosuvastatin (CRESTOR) 40 MG tablet TAKE ONE (1) TABLET EACH DAY *GENERIC CRESTOR 04/30/15  Yes Birdie Sons, MD  sildenafil (REVATIO) 20 MG  tablet Take 2.5 tablets (50 mg total) by mouth as needed. Prescribed by Dr. Nehemiah Massed 08/27/15  Yes Birdie Sons, MD  torsemide (DEMADEX) 20 MG tablet 2 tablets once or twice a day for swelling Patient taking differently: Take 40 mg by mouth 2 (two) times daily.  08/26/15  Yes Birdie Sons, MD  XARELTO 15 MG TABS tablet TAKE ONE TABLET BY MOUTH DAILY WITH EVENING MEAL 01/14/15  Yes Birdie Sons, MD  zolpidem (AMBIEN) 5 MG tablet Take 1 tablet by mouth at bedtime. Reported on 07/08/2015 04/19/12  Yes Historical Provider, MD   BP 65/35 mmHg  Pulse 61  Temp(Src) 99 F (37.2 C) (Axillary)  Resp 25  Ht 5' 8"  (1.727 m)  Wt 217 lb 9.5 oz (98.7 kg)  BMI 33.09 kg/m2  SpO2 100% Physical Exam  Constitutional: He appears well-developed and well-nourished.  HENT:  Head: Normocephalic and atraumatic.  Right Ear: Hearing normal.  Left Ear: Hearing normal.  Nose: Nose normal.  Mouth/Throat: Oropharynx is clear and moist and mucous membranes are normal.  Eyes: Conjunctivae and EOM are normal. Pupils are equal, round, and reactive to light.  Neck: Normal range of motion. Neck supple.  Cardiovascular: Regular rhythm, S1 normal and S2 normal.  Exam reveals no gallop and no friction rub.   No murmur heard. Pulmonary/Chest: Effort normal and breath sounds normal. No respiratory distress. He exhibits no tenderness.  Abdominal: Soft. Normal appearance and bowel sounds are normal. There is no hepatosplenomegaly. There is no tenderness. There is no rebound, no guarding, no tenderness at McBurney's point and negative Murphy's sign. No hernia.  Musculoskeletal: Normal range of motion.  Neurological: He has normal strength. No cranial nerve deficit or sensory deficit. GCS eye subscore is 4. GCS verbal subscore is 5. GCS motor subscore is 6.  Skin: Skin is warm, dry and intact. No rash noted. No cyanosis.  Psychiatric: He has a normal mood and affect. His speech is normal.  Nursing note and vitals  reviewed.   ED Course  Procedures  DIAGNOSTIC STUDIES:  Oxygen Saturation is 98% on ETT, normal by my interpretation.    Labs Review Labs Reviewed  PROTIME-INR - Abnormal; Notable for the following:    Prothrombin Time 28.4 (*)    INR 2.72 (*)    All other components within normal limits  APTT - Abnormal; Notable for the following:    aPTT 38 (*)    All other components within normal limits  CBC - Abnormal; Notable for the following:    WBC 11.3 (*)    RBC 2.80 (*)    Hemoglobin 7.9 (*)    HCT 25.1 (*)    RDW 17.3 (*)  All other components within normal limits  DIFFERENTIAL - Abnormal; Notable for the following:    Neutro Abs 10.0 (*)    All other components within normal limits  COMPREHENSIVE METABOLIC PANEL - Abnormal; Notable for the following:    Potassium 5.2 (*)    Chloride 97 (*)    CO2 16 (*)    Glucose, Bld 116 (*)    BUN 133 (*)    Creatinine, Ser 5.40 (*)    Calcium 8.3 (*)    Total Protein 6.3 (*)    Albumin 3.4 (*)    AST 62 (*)    Total Bilirubin 1.5 (*)    GFR calc non Af Amer 9 (*)    GFR calc Af Amer 11 (*)    Anion gap 22 (*)    All other components within normal limits  URINE RAPID DRUG SCREEN, HOSP PERFORMED - Abnormal; Notable for the following:    Opiates POSITIVE (*)    All other components within normal limits  URINALYSIS, ROUTINE W REFLEX MICROSCOPIC (NOT AT [facility]) - Abnormal; Notable for the following:    APPearance TURBID (*)    Hgb urine dipstick MODERATE (*)    Protein, ur >300 (*)    Leukocytes, UA SMALL (*)    All other components within normal limits  URINE MICROSCOPIC-ADD ON - Abnormal; Notable for the following:    Squamous Epithelial / LPF 0-5 (*)    Bacteria, UA FEW (*)    Casts HYALINE CASTS (*)    All other components within normal limits  I-STAT CHEM 8, ED - Abnormal; Notable for the following:    Sodium 131 (*)    Chloride 95 (*)    BUN 131 (*)    Creatinine, Ser 5.20 (*)    Glucose, Bld 110 (*)    Calcium, Ion  0.90 (*)    Hemoglobin 8.8 (*)    HCT 26.0 (*)    All other components within normal limits  I-STAT ARTERIAL BLOOD GAS, ED - Abnormal; Notable for the following:    pCO2 arterial 34.1 (*)    pO2, Arterial 58.0 (*)    Bicarbonate 19.0 (*)    Acid-base deficit 6.0 (*)    All other components within normal limits  I-STAT ARTERIAL BLOOD GAS, ED - Abnormal; Notable for the following:    pCO2 arterial 31.8 (*)    pO2, Arterial 74.0 (*)    Acid-base deficit 3.0 (*)    All other components within normal limits  WOUND CULTURE  URINE CULTURE  CULTURE, BLOOD (ROUTINE X 2)  CULTURE, BLOOD (ROUTINE X 2)  CULTURE, RESPIRATORY (NON-EXPECTORATED)  RESPIRATORY VIRUS PANEL  ETHANOL  CREATININE, URINE, RANDOM  TECHNOLOGIST SMEAR REVIEW  UREA NITROGEN, URINE  10-HYDROXYCARBAZEPINE  TROPONIN I  TROPONIN I  TROPONIN I  PROCALCITONIN  HEMOGLOBIN AND HEMATOCRIT, BLOOD  HEPATIC FUNCTION PANEL  DIGOXIN LEVEL  CK  I-STAT TROPOININ, ED  TYPE AND SCREEN    Imaging Review Ct Abdomen Pelvis Wo Contrast  09/03/2015  CLINICAL DATA:  Unresponsive. EXAM: CT ABDOMEN AND PELVIS WITHOUT CONTRAST TECHNIQUE: Multidetector CT imaging of the abdomen and pelvis was performed following the standard protocol without IV contrast. COMPARISON:  None. FINDINGS: Dense consolidation in both lung bases with air bronchograms. This could represent pneumonia, aspiration, hemorrhage. There are unremarkable unenhanced appearances of the liver, gallbladder, bile ducts, pancreas, spleen, adrenals and kidneys. Renal vascular calcifications are present. There are extensive atherosclerotic calcifications of the normal caliber aorta. There is a nasogastric  tube extending into the stomach. Bowel is otherwise unremarkable. No bowel obstruction. No extraluminal air. No acute inflammatory changes are evident in the abdomen or pelvis. There is no ascites. No significant musculoskeletal lesion is evident. IMPRESSION: 1. No acute findings are  evident in the abdomen or pelvis. 2. Dense consolidation in both lung bases Electronically Signed   By: Andreas Newport M.D.   On: 09/03/2015 06:05   Ct Head Wo Contrast  09/03/2015  CLINICAL DATA:  Unresponsive. EXAM: CT HEAD WITHOUT CONTRAST CT CERVICAL SPINE WITHOUT CONTRAST TECHNIQUE: Multidetector CT imaging of the head and cervical spine was performed following the standard protocol without intravenous contrast. Multiplanar CT image reconstructions of the cervical spine were also generated. COMPARISON:  01/17/2015 FINDINGS: CT HEAD FINDINGS There is no intracranial hemorrhage, mass or evidence of acute infarction. There is no extra-axial fluid collection. There is moderate generalized atrophy. Possible remote lacunar infarctions in the left pons and left basal ganglia. No bony abnormality. CT CERVICAL SPINE FINDINGS The vertebral column, pedicles and facet articulations are intact. There is no evidence of acute fracture. No acute soft tissue abnormalities are evident. Mild motion degradation of the images.  Endotracheal tube noted. IMPRESSION: 1. No acute intracranial findings. Mild generalized atrophy and remote lacunar infarctions, unchanged. 2. Negative for acute cervical spine fracture. Mildly motion degraded study. Critical Value/emergent results were called by telephone at the time of interpretation on 09/03/2015 at 3:39 am to Dr. Wallie Char, who verbally acknowledged these results. Electronically Signed   By: Andreas Newport M.D.   On: 09/03/2015 03:40   Ct Cervical Spine Wo Contrast  09/03/2015  CLINICAL DATA:  Unresponsive. EXAM: CT HEAD WITHOUT CONTRAST CT CERVICAL SPINE WITHOUT CONTRAST TECHNIQUE: Multidetector CT imaging of the head and cervical spine was performed following the standard protocol without intravenous contrast. Multiplanar CT image reconstructions of the cervical spine were also generated. COMPARISON:  01/17/2015 FINDINGS: CT HEAD FINDINGS There is no intracranial  hemorrhage, mass or evidence of acute infarction. There is no extra-axial fluid collection. There is moderate generalized atrophy. Possible remote lacunar infarctions in the left pons and left basal ganglia. No bony abnormality. CT CERVICAL SPINE FINDINGS The vertebral column, pedicles and facet articulations are intact. There is no evidence of acute fracture. No acute soft tissue abnormalities are evident. Mild motion degradation of the images.  Endotracheal tube noted. IMPRESSION: 1. No acute intracranial findings. Mild generalized atrophy and remote lacunar infarctions, unchanged. 2. Negative for acute cervical spine fracture. Mildly motion degraded study. Critical Value/emergent results were called by telephone at the time of interpretation on 09/03/2015 at 3:39 am to Dr. Wallie Char, who verbally acknowledged these results. Electronically Signed   By: Andreas Newport M.D.   On: 09/03/2015 03:40   Dg Chest Port 1 View  09/03/2015  CLINICAL DATA:  Respiratory failure EXAM: PORTABLE CHEST 1 VIEW COMPARISON:  None. FINDINGS: The endotracheal tube is 5.2 cm above the carina. There is marked cardiomegaly, unchanged. There is extensive vascular and interstitial fullness which has worsened. There is worsening perihilar alveolar opacity, likely edema. No pneumothorax. No large effusions. IMPRESSION: Satisfactory ET tube position. Worsening changes of congestive heart failure with interstitial and alveolar edema. Electronically Signed   By: Andreas Newport M.D.   On: 09/03/2015 03:18   I have personally reviewed and evaluated these images and lab results as part of my medical decision-making.   EKG Interpretation   Date/Time:  Tuesday September 03 2015 03:07:09 EDT Ventricular Rate:  67 PR Interval:  QRS Duration: 124 QT Interval:  474 QTC Calculation: 500 R Axis:   120 Text Interpretation:  Atrial fibrillation Nonspecific intraventricular  conduction delay Borderline repolarization abnormality No  significant  change since last tracing Confirmed by Jen Benedict  MD, Zona Pedro ([id number]) on  09/03/2015 7:21:05 AM        EMERGENT INTUBATION PROCEDURE NOTE INDICATION: airway compromise  TECHNIQUE: Unable to obtain consent because of altered level of consciousness.  After pre-oxygenating the patient for 3 minutes, a modified rapid-sequence induction was performed using etomidate and succinylcholine with cricoid pressure. Using a GlideScope laryngoscope blade and 7.59m cuffed endotracheal tube was placed and secured.  Placement was confirmed with by auscultation.  COMPLICATIONS: None. The patient tolerated the procedure well with no complications. POST PROCEDURE CXR: pending   CRITICAL CARE Performed by: COrpah Greek MD Total critical care time: 35 minutes Critical care time was exclusive of separately billable procedures and treating other patients. Critical care was necessary to treat or prevent imminent or life-threatening deterioration. Critical care was time spent personally by me on the following activities: development of treatment plan with patient and/or surrogate as well as nursing, discussions with consultants, evaluation of patient's response to treatment, examination of patient, obtaining history from patient or surrogate, ordering and performing treatments and interventions, ordering and review of laboratory studies, ordering and review of radiographic studies, pulse oximetry and re-evaluation of patient's condition.  MDM   Final diagnoses:  Long term (current) use of anticoagulants  Normocytic anemia  Ecchymosis  Fall at home  Fall  Acute on chronic renal failure ([facility]    Patient presents to the ER for evaluation of fall and unresponsiveness. Wife reports that he has not been feeling well for approximately a week. He has been feeling weak and has had several falls. Patient fell several days ago and suffered a large hematoma and ecchymosis over the left abdomen. He  does take several toe. Wife reports that he coming to bed late tonight and when he reached the bed he suddenly fell. She heard him fall and encountered him next to the bed unresponsive. There was nothing that suggested seizure activity. She could not awaken him and called EMS. EMS report that the patient was having difficulty breathing and was not responsive. They initially thought he had unequal pupils. He was brought to the ER with assisted ventilation by bag valve mask.  Recent was not following commands or responding upon arrival. He was bradycardic and borderline hypotensive. His breathing pattern was agonal. Patient was intubated emergently. He was administered atropine and his bradycardia and hypotension resolved.  He was brought to CT emergently to rule out head bleed and stroke. CT scan did not show any acute pathology. Because he had a fall, CT cervical spine was also performed. This did not show any injury.  Her cup has revealed evidence of congestive heart failure on x-ray. He also is found to be in acute renal failure. This is likely the cause of the patient's current symptoms. He is not hyperkalemic.  Patient admitted to the ICU.   I personally performed the services described in this documentation, which was scribed in my presence. The recorded information has been reviewed and is accurate.      COrpah Greek MD 09/03/15 [phone]

## 2015-09-03 NOTE — Progress Notes (Signed)
Initial Nutrition Assessment  DOCUMENTATION CODES:   Obesity unspecified  INTERVENTION:   Initiate Vital High Protein @ 30 ml/hr via OG tube.   60 ml Prostat TID.    Tube feeding regimen provides 1320 kcal, 153 grams of protein, and 601 ml of H2O.   NUTRITION DIAGNOSIS:   Inadequate oral intake related to inability to eat as evidenced by NPO status.  GOAL:   Provide needs based on ASPEN/SCCM guidelines  MONITOR:   Skin, I & O's, Vent status, Weight trends, Labs, TF tolerance  REASON FOR ASSESSMENT:   Consult Enteral/tube feeding initiation and management  ASSESSMENT:   Pt with a past medical history of A. Fib on Xarelto, Chronic Diastolic Heart Failure, Pulmonary HTN, COPD Unknown Stage, CKD Stage III, possible history of seizures presents to the ED after being found unresponsive at home.   Patient is currently intubated on ventilator support MV: 9.3 L/min Temp (24hrs), Avg:98.9 F (37.2 C), Min:98.4 F (36.9 C), Max:99.5 F (37.5 C)  Labs reviewed: potassium elevated 5.2, BUN elevated 133, Cr 5.4 OG tube Nutrition-Focused physical exam completed. Findings are no fat depletion, no muscle depletion, and unable to assess edema due to bilateral LE wounds.  No family currently present.  Discussed with RN, plan to start TF after MRI.   Diet Order:  Diet NPO time specified  Skin:  Wound (see comment) (Patchy areas of partial thickness stasis ulcers to bilat anterior calves)  Last BM:  unknown  Height:   Ht Readings from Last 1 Encounters:  09/03/15 5\' 8"  (1.727 m)    Weight:   Wt Readings from Last 1 Encounters:  09/03/15 217 lb 9.5 oz (98.7 kg)    Ideal Body Weight:  70 kg  BMI:  Body mass index is 33.09 kg/(m^2).  Estimated Nutritional Needs:   Kcal:  [id number]  Protein:  >/= 140 grams  Fluid:  > 1.5 L/day  EDUCATION NEEDS:   No education needs identified at this time  [place], [place], [place] Pager [phone] After Hours  Pager

## 2015-09-04 ENCOUNTER — Inpatient Hospital Stay (HOSPITAL_COMMUNITY): Payer: Medicare Other

## 2015-09-04 ENCOUNTER — Ambulatory Visit: Payer: Medicare Other | Admitting: Occupational Therapy

## 2015-09-04 DIAGNOSIS — Z95828 Presence of other vascular implants and grafts: Secondary | ICD-10-CM | POA: Diagnosis present

## 2015-09-04 DIAGNOSIS — J438 Other emphysema: Secondary | ICD-10-CM

## 2015-09-04 DIAGNOSIS — I509 Heart failure, unspecified: Secondary | ICD-10-CM

## 2015-09-04 DIAGNOSIS — R569 Unspecified convulsions: Secondary | ICD-10-CM

## 2015-09-04 DIAGNOSIS — I480 Paroxysmal atrial fibrillation: Secondary | ICD-10-CM

## 2015-09-04 LAB — BLOOD GAS, ARTERIAL
Acid-base deficit: 7.4 mmol/L — ABNORMAL HIGH (ref 0.0–2.0)
Acid-base deficit: 5.3 mmol/L — ABNORMAL HIGH (ref 0.0–2.0)
Bicarbonate: 17.4 mEq/L — ABNORMAL LOW (ref 20.0–24.0)
Bicarbonate: 19.9 mEq/L — ABNORMAL LOW (ref 20.0–24.0)
Drawn by: 313941
Drawn by: 448981
FIO2: 0.8
FIO2: 0.6
MECHVT: 550 mL
MECHVT: 550 mL
O2 Saturation: 97.3 %
O2 Saturation: 93.3 %
PEEP: 10 cmH2O
PEEP: 10 cmH2O
Patient temperature: 98.6
Patient temperature: 98.6
RATE: 14 resp/min
RATE: 18 resp/min
TCO2: 18.4 mmol/L (ref 0–100)
TCO2: 21.1 mmol/L (ref 0–100)
pCO2 arterial: 33.8 mmHg — ABNORMAL LOW (ref 35.0–45.0)
pCO2 arterial: 40.4 mmHg (ref 35.0–45.0)
pH, Arterial: 7.331 — ABNORMAL LOW (ref 7.350–7.450)
pH, Arterial: 7.313 — ABNORMAL LOW (ref 7.350–7.450)
pO2, Arterial: 114 mmHg — ABNORMAL HIGH (ref 80.0–100.0)
pO2, Arterial: 79.6 mmHg — ABNORMAL LOW (ref 80.0–100.0)

## 2015-09-04 LAB — ECHOCARDIOGRAM COMPLETE
Height: 68 in
Weight: 3647.29 oz

## 2015-09-04 LAB — GLUCOSE, CAPILLARY
Glucose-Capillary: 134 mg/dL — ABNORMAL HIGH (ref 65–99)
Glucose-Capillary: 145 mg/dL — ABNORMAL HIGH (ref 65–99)
Glucose-Capillary: 143 mg/dL — ABNORMAL HIGH (ref 65–99)
Glucose-Capillary: 117 mg/dL — ABNORMAL HIGH (ref 65–99)
Glucose-Capillary: 123 mg/dL — ABNORMAL HIGH (ref 65–99)

## 2015-09-04 LAB — CBC WITH DIFFERENTIAL/PLATELET
Basophils Absolute: 0 10*3/uL (ref 0.0–0.1)
Basophils Relative: 0 %
Eosinophils Absolute: 0 10*3/uL (ref 0.0–0.7)
Eosinophils Relative: 0 %
HCT: 24.8 % — ABNORMAL LOW (ref 39.0–52.0)
Hemoglobin: 7.7 g/dL — ABNORMAL LOW (ref 13.0–17.0)
Lymphocytes Relative: 2 %
Lymphs Abs: 0.4 10*3/uL — ABNORMAL LOW (ref 0.7–4.0)
MCH: 27.5 pg (ref 26.0–34.0)
MCHC: 31 g/dL (ref 30.0–36.0)
MCV: 88.6 fL (ref 78.0–100.0)
Monocytes Absolute: 1 10*3/uL (ref 0.1–1.0)
Monocytes Relative: 5 %
Neutro Abs: 20.9 10*3/uL — ABNORMAL HIGH (ref 1.7–7.7)
Neutrophils Relative %: 93 %
Platelets: 351 10*3/uL (ref 150–400)
RBC: 2.8 MIL/uL — ABNORMAL LOW (ref 4.22–5.81)
RDW: 17.3 % — ABNORMAL HIGH (ref 11.5–15.5)
WBC: 22.3 10*3/uL — ABNORMAL HIGH (ref 4.0–10.5)

## 2015-09-04 LAB — RENAL FUNCTION PANEL
Albumin: 2.8 g/dL — ABNORMAL LOW (ref 3.5–5.0)
Anion gap: 14 (ref 5–15)
BUN: 132 mg/dL — ABNORMAL HIGH (ref 6–20)
CO2: 19 mmol/L — ABNORMAL LOW (ref 22–32)
Calcium: 7.7 mg/dL — ABNORMAL LOW (ref 8.9–10.3)
Chloride: 97 mmol/L — ABNORMAL LOW (ref 101–111)
Creatinine, Ser: 5.22 mg/dL — ABNORMAL HIGH (ref 0.61–1.24)
GFR calc Af Amer: 11 mL/min — ABNORMAL LOW (ref >60)
GFR calc non Af Amer: 9 mL/min — ABNORMAL LOW (ref >60)
Glucose, Bld: 141 mg/dL — ABNORMAL HIGH (ref 65–99)
Phosphorus: 6.6 mg/dL — ABNORMAL HIGH (ref 2.5–4.6)
Potassium: 4.6 mmol/L (ref 3.5–5.1)
Sodium: 130 mmol/L — ABNORMAL LOW (ref 135–145)

## 2015-09-04 LAB — RESPIRATORY VIRUS PANEL
Adenovirus: NEGATIVE
Influenza A: NEGATIVE
Influenza B: NEGATIVE
Metapneumovirus: NEGATIVE
Parainfluenza 1: NEGATIVE
Parainfluenza 2: NEGATIVE
Parainfluenza 3: NEGATIVE
Respiratory Syncytial Virus A: NEGATIVE
Respiratory Syncytial Virus B: NEGATIVE
Rhinovirus: NEGATIVE

## 2015-09-04 LAB — BASIC METABOLIC PANEL
Anion gap: 15 (ref 5–15)
Anion gap: 14 (ref 5–15)
Anion gap: 15 (ref 5–15)
BUN: 131 mg/dL — ABNORMAL HIGH (ref 6–20)
BUN: 130 mg/dL — ABNORMAL HIGH (ref 6–20)
BUN: 138 mg/dL — ABNORMAL HIGH (ref 6–20)
CO2: 18 mmol/L — ABNORMAL LOW (ref 22–32)
CO2: 21 mmol/L — ABNORMAL LOW (ref 22–32)
CO2: 21 mmol/L — ABNORMAL LOW (ref 22–32)
Calcium: 7.8 mg/dL — ABNORMAL LOW (ref 8.9–10.3)
Calcium: 7.7 mg/dL — ABNORMAL LOW (ref 8.9–10.3)
Calcium: 7.9 mg/dL — ABNORMAL LOW (ref 8.9–10.3)
Chloride: 96 mmol/L — ABNORMAL LOW (ref 101–111)
Chloride: 97 mmol/L — ABNORMAL LOW (ref 101–111)
Chloride: 98 mmol/L — ABNORMAL LOW (ref 101–111)
Creatinine, Ser: 5.14 mg/dL — ABNORMAL HIGH (ref 0.61–1.24)
Creatinine, Ser: 5.19 mg/dL — ABNORMAL HIGH (ref 0.61–1.24)
Creatinine, Ser: 4.91 mg/dL — ABNORMAL HIGH (ref 0.61–1.24)
GFR calc Af Amer: 11 mL/min — ABNORMAL LOW (ref >60)
GFR calc Af Amer: 11 mL/min — ABNORMAL LOW (ref >60)
GFR calc Af Amer: 12 mL/min — ABNORMAL LOW (ref >60)
GFR calc non Af Amer: 10 mL/min — ABNORMAL LOW (ref >60)
GFR calc non Af Amer: 10 mL/min — ABNORMAL LOW (ref >60)
GFR calc non Af Amer: 10 mL/min — ABNORMAL LOW (ref >60)
Glucose, Bld: 152 mg/dL — ABNORMAL HIGH (ref 65–99)
Glucose, Bld: 154 mg/dL — ABNORMAL HIGH (ref 65–99)
Glucose, Bld: 144 mg/dL — ABNORMAL HIGH (ref 65–99)
Potassium: 4.8 mmol/L (ref 3.5–5.1)
Potassium: 4.4 mmol/L (ref 3.5–5.1)
Potassium: 3.6 mmol/L (ref 3.5–5.1)
Sodium: 129 mmol/L — ABNORMAL LOW (ref 135–145)
Sodium: 132 mmol/L — ABNORMAL LOW (ref 135–145)
Sodium: 134 mmol/L — ABNORMAL LOW (ref 135–145)

## 2015-09-04 LAB — URINE CULTURE
Culture: 8000 — AB
Special Requests: NORMAL

## 2015-09-04 LAB — MAGNESIUM: Magnesium: 2.3 mg/dL (ref 1.7–2.4)

## 2015-09-04 LAB — UREA NITROGEN, URINE: Urea Nitrogen, Ur: 338 mg/dL

## 2015-09-04 LAB — PROCALCITONIN: Procalcitonin: 48.1 ng/mL

## 2015-09-04 LAB — APTT: aPTT: 35 seconds (ref 24–37)

## 2015-09-04 LAB — PROTIME-INR
INR: 2.05 — ABNORMAL HIGH (ref 0.00–1.49)
Prothrombin Time: 23 seconds — ABNORMAL HIGH (ref 11.6–15.2)

## 2015-09-04 MED ORDER — FUROSEMIDE 10 MG/ML IJ SOLN
5.0000 mg/h | INTRAVENOUS | Status: DC
  Administered 2015-09-04: 5 mg/h via INTRAVENOUS
  Filled 2015-09-04: qty 25

## 2015-09-04 NOTE — Progress Notes (Signed)
  Echocardiogram 2D Echocardiogram has been performed.  Vincent Mason 09/04/2015, 3:57 PM

## 2015-09-04 NOTE — H&P (Signed)
PULMONARY / CRITICAL CARE MEDICINE   Name: Vincent Mason MRN: [id number] DOB: [birth year]    ADMISSION DATE:  09/03/2015 CONSULTATION DATE:  09/03/2015  REFERRING MD:  EDP  CHIEF COMPLAINT:  Unresponsiveness  HISTORY OF PRESENT ILLNESS:   Vincent Mason is a 80 year old male with a past medical history of A. Fib on Xarelto, Chronic Diastolic Heart Failure, Pulmonary HTN, COPD Unknown Stage, CKD Stage III, possible history of seizures presents to the ED after being found unresponsive at home. Wife reports that he was his normal self yesterday, woke up around 2 AM to use the bathroom and fell. Wife found him unresponsive and called EMS. EMS reports he had unequal pupils with left larger than right. He was intubated on arrival to the ED due to acute respiratory failure. Wife reports several episodes of staring spells and several episodes of unresponsiveness that have lasted 5-10 minutes the past several months. He was admitted 12/2014 with similar presentation and thought to be having seizures at that time and started on Trileptal with neurology follow up.   SUBJECTIVE:  Improved pressors, HR, urein output, cvp 22  VITAL SIGNS: BP 139/58 mmHg  Pulse 64  Temp(Src) 97.9 F (36.6 C) (Core (Comment))  Resp 18  Ht 5\' 8"  (1.727 m)  Wt 103.4 kg (227 lb 15.3 oz)  BMI 34.67 kg/m2  SpO2 97%  HEMODYNAMICS: CVP:  [20 mmHg-27 mmHg] 20 mmHg  VENTILATOR SETTINGS: Vent Mode:  [-] PRVC FiO2 (%):  [60 %-100 %] 60 % Set Rate:  [14 bmp-18 bmp] 18 bmp Vt Set:  [550 mL] 550 mL PEEP:  [10 cmH20] 10 cmH20 Plateau Pressure:  [24 cmH20-27 cmH20] 25 cmH20  INTAKE / OUTPUT: I/O last 3 completed shifts: In: 5204.3 [I.V.:4214.3; NG/GT:240; IV Piggyback:750] Out: [id number] [Urine:1665]  PHYSICAL EXAMINATION: General: vented Neuro: moves all ext purpsoeful, does fc this am  HEENT: ett, jvd increased, line clean, no sig hematoma PULM: coarse crackles CV: s1 s2 irR GI: soft, bs wnl, no r Extremities: mild edema,  lower ext ecchymosis  LABS:  BMET  Recent Labs Lab 09/03/15 1817 09/04/15 0215 09/04/15 0453  NA 131* 129* 130*  K 5.4* 4.8 4.6  CL 98* 96* 97*  CO2 20* 18* 19*  BUN 129* 131* 132*  CREATININE 5.10* 5.14* 5.22*  GLUCOSE 118* 152* 141*    Electrolytes  Recent Labs Lab 09/03/15 1439 09/03/15 1817 09/04/15 0215 09/04/15 0453  CALCIUM  --  7.5* 7.8* 7.7*  MG 2.2 2.1  --  2.3  PHOS 6.5* 6.5*  --  6.6*    CBC  Recent Labs Lab 09/03/15 0306 09/03/15 1816 09/04/15 0453  WBC 11.3*  --  22.3*  HGB 7.9* 7.9* 7.7*  HCT 25.1* 24.1* 24.8*  PLT 354  --  351    Coag's  Recent Labs Lab 09/03/15 0306 09/04/15 0453  APTT 38* 35  INR 2.72* 2.05*    Sepsis Markers  Recent Labs Lab 09/03/15 0641 09/03/15 1308 09/04/15 0453  LATICACIDVEN  --  2.3*  --   PROCALCITON 10.78  --  48.10    ABG  Recent Labs Lab 09/03/15 0313 09/03/15 0413 09/03/15 1318  PHART 7.355 7.421 7.331*  PCO2ART 34.1* 31.8* 33.8*  PO2ART 58.0* 74.0* 114*    Liver Enzymes  Recent Labs Lab 09/03/15 0306 09/03/15 1816 09/04/15 0453  AST 62* 70*  --   ALT 37 47  --   ALKPHOS 64 55  --   BILITOT 1.5* 0.9  --  ALBUMIN 3.4* 2.9* 2.8*    Cardiac Enzymes  Recent Labs Lab 09/03/15 0641 09/03/15 1817  TROPONINI 0.06* 0.07*    Glucose  Recent Labs Lab 09/03/15 1202 09/03/15 1553 09/03/15 1956 09/03/15 2339 09/04/15 0333 09/04/15 0831  GLUCAP 98 92 127* 125* 134* 145*    Imaging US Renal Port  09/04/2015  CLINICAL DATA:  Acute onset of renal insufficiency. EXAM: RENAL / URINARY TRACT ULTRASOUND COMPLETE COMPARISON:  CT of the abdomen and pelvis from 09/03/2015 FINDINGS: Right Kidney: Length: 11.8 cm. Slightly increased parenchymal echogenicity noted. No mass or hydronephrosis visualized. Left Kidney: Length: 13.0 cm. Slightly increased parenchymal echogenicity noted. No mass or hydronephrosis visualized. Bladder: Largely decompressed, with a Foley catheter in place.  IMPRESSION: 1. No evidence of hydronephrosis. 2. Slightly increased renal parenchymal echogenicity could reflect mild medical renal disease. Electronically Signed   By: Garald Balding M.D.   On: 09/04/2015 02:55   Dg Chest Port 1 View  09/03/2015  CLINICAL DATA:  80 year old male status post PICC line placement. Initial encounter. EXAM: PORTABLE CHEST 1 VIEW COMPARISON:  CT Abdomen and Pelvis 0534 hours today. FINDINGS: Portable AP semi upright view at 1218 hours. Pacing pads overlie the chest. No PICC line is identified. There is a right IJ approach central line, tip at the level of the carina. Bilateral lung base consolidation re- demonstrated. There is cardiomegaly. Other mediastinal contours are within normal limits. Sequelae of CABG. No pneumothorax or pulmonary edema. No definite pleural effusion. IMPRESSION: 1. Right IJ central line placed, tip at the lower SVC level. No pneumothorax. 2. No PICC line identified. 3. Bilateral lower lobe consolidation compatible with bilateral pneumonia. Cardiomegaly. Electronically Signed   By: Genevie Ann M.D.   On: 09/03/2015 13:14   STUDIES:  09/03/2015 CT Head/Cervical Spine: No acute intracranial findings. Negative for acute cervical spine fracture.  09/03/2015 CXR: Worsening changes of congestive heart failure with interstitial and alveolar edema 4/11 ct abdo/pelvis>>> neg acute 4/11MRI>>> 4/11 EEG>>>no focus 4/11 renal US>>>neg hydro  CULTURES: None  ANTIBIOTICS: None  SIGNIFICANT EVENTS: 4/11 - Admit 4/11 shock, neo rising 4/12- improved shock  LINES/TUBES: 09/03/2015 PIV Left Hand, Left Wrist 09/03/2015 Foley 09/03/2015 NG/OG Tube 09/03/2015 ETT>>> 4/11 rt IJ>>> 4/11 rt aline>>>  DISCUSSION: 80 year old male found unresponsive at home with acute respiratory failure, acute renal failure and acute anemia. History of possible seizure activity, chronic opioid/bzd dependence.   ASSESSMENT / PLAN:  PULMONARY A: Acute Hypoxic Respiratory Failure  - dense bilateral infiltrates H/O COPD Questionable H/O Pulmonary HTN ASP PNA? P:   Get pcxr this am and in am  Keep current MV To goal 50% then peep 5 Even to neg balnce goals  CARDIOVASCULAR A:  Chronic Diastolic HF (per PCP note EF normal in 2015) Septic shock H/O Atrial fibrillation - chronic anticoagulation w/ Xarelto H/O CAD s/p CABG H/O HTN H/O Hyperlipidemia  P:  Water bottle heart either rt heart or pericardial effusion?, get echo Levophed is reducing Dopamine to be last pressor to remain with prior brady Echo awaited  Still No role stress roids, cortisol adaquate  RENAL A:   Acute on Chronic Renal Failure Stage III AG acidosis P:   kvo Lasix cvp 22, may be some pa htn contribution  GASTROINTESTINAL A:   Questionable H/O GERD - On Zantac. Transaminitis - Mild.  R/o gastric stress on xarelto P:   TF to goal ppi  HEMATOLOGIC A:   Extensive Bruising - Likely secondary to Xarelto & falls. Anemia -  Likely secondary to blood loss. Coagulopathy - Secondary to Xarelto & acute on chronic renal failure. Leukocytosis - Sepsis versus stress response.  P:  Am CBC Holding Xarelto scd  INFECTIOUS A:   Septic shock, PNA asp? atypical Possible UTI - Few bacteria seen on UA  P:   Pct rise noted (some from renal failure), pressors are better, clinically he is better Maintain current therapy  ENDOCRINE A:   No rel ai  P:   cbg Dc roids  NEUROLOGIC A:   Acute Encephalopathy Recurrent Falls H/O Possible Seizures Chronic Narcotic & Benzodiazepine use  P:   MRI pending if when more stable for this EEG neg focus Neurology following RASS goal: 0  Fentanyl gtt with wua   FAMILY   - Inter-disciplinary family meet or Palliative Care meeting due by:  09/10/2015  Ccm time 35 min   Lavon Paganini. Titus Mould, MD, [place] Pgr: [facility]

## 2015-09-04 NOTE — Progress Notes (Signed)
Subjective: Much improved overnight.   Exam: Filed Vitals:   09/04/15 1150 09/04/15 1200  BP:  112/43  Pulse: 55 54  Temp: 97.9 F (36.6 C) 97.9 F (36.6 C)  Resp: 18 18   Gen: In bed, NAD Resp: non-labored breathing, no acute distress Abd: soft, nt  Neuro: MS: awakens to voice, follows commands CN: fixates and tracks across midline in both directions.  Motor: follows commands x 4.   Impression: 80 yo M with likely septic encephalopathy. Now improving on antibiotics.   Recommendations: 1) continue treatment of underlying infection.  2) will follow  Roland Rack, MD [facility] [phone]  If 7pm- 7am, please page neurology on call as listed in [place].

## 2015-09-05 ENCOUNTER — Inpatient Hospital Stay (HOSPITAL_COMMUNITY): Payer: Medicare Other

## 2015-09-05 ENCOUNTER — Ambulatory Visit: Payer: Medicare Other | Admitting: Occupational Therapy

## 2015-09-05 ENCOUNTER — Encounter: Payer: Self-pay | Admitting: Occupational Therapy

## 2015-09-05 DIAGNOSIS — I272 Other secondary pulmonary hypertension: Secondary | ICD-10-CM

## 2015-09-05 LAB — CBC WITH DIFFERENTIAL/PLATELET
Basophils Absolute: 0 10*3/uL (ref 0.0–0.1)
Basophils Relative: 0 %
Eosinophils Absolute: 0.1 10*3/uL (ref 0.0–0.7)
Eosinophils Relative: 1 %
HCT: 23.6 % — ABNORMAL LOW (ref 39.0–52.0)
Hemoglobin: 7.7 g/dL — ABNORMAL LOW (ref 13.0–17.0)
Lymphocytes Relative: 5 %
Lymphs Abs: 1 10*3/uL (ref 0.7–4.0)
MCH: 28.7 pg (ref 26.0–34.0)
MCHC: 32.6 g/dL (ref 30.0–36.0)
MCV: 88.1 fL (ref 78.0–100.0)
Monocytes Absolute: 0.9 10*3/uL (ref 0.1–1.0)
Monocytes Relative: 5 %
Neutro Abs: 18.1 10*3/uL — ABNORMAL HIGH (ref 1.7–7.7)
Neutrophils Relative %: 89 %
Platelets: 322 10*3/uL (ref 150–400)
RBC: 2.68 MIL/uL — ABNORMAL LOW (ref 4.22–5.81)
RDW: 17.5 % — ABNORMAL HIGH (ref 11.5–15.5)
WBC: 20.2 10*3/uL — ABNORMAL HIGH (ref 4.0–10.5)

## 2015-09-05 LAB — LEGIONELLA PNEUMOPHILA SEROGP 1 UR AG: L. pneumophila Serogp 1 Ur Ag: NEGATIVE

## 2015-09-05 LAB — BLOOD GAS, ARTERIAL
Acid-base deficit: 4 mmol/L — ABNORMAL HIGH (ref 0.0–2.0)
Bicarbonate: 20.8 mEq/L (ref 20.0–24.0)
Drawn by: 419771
FIO2: 0.4
MECHVT: 550 mL
O2 Saturation: 90.5 %
PEEP: 10 cmH2O
Patient temperature: 98.6
RATE: 18 resp/min
TCO2: 22 mmol/L (ref 0–100)
pCO2 arterial: 39.2 mmHg (ref 35.0–45.0)
pH, Arterial: 7.344 — ABNORMAL LOW (ref 7.350–7.450)
pO2, Arterial: 66.5 mmHg — ABNORMAL LOW (ref 80.0–100.0)

## 2015-09-05 LAB — GLUCOSE, CAPILLARY
Glucose-Capillary: 115 mg/dL — ABNORMAL HIGH (ref 65–99)
Glucose-Capillary: 119 mg/dL — ABNORMAL HIGH (ref 65–99)
Glucose-Capillary: 127 mg/dL — ABNORMAL HIGH (ref 65–99)
Glucose-Capillary: 129 mg/dL — ABNORMAL HIGH (ref 65–99)
Glucose-Capillary: 132 mg/dL — ABNORMAL HIGH (ref 65–99)
Glucose-Capillary: 136 mg/dL — ABNORMAL HIGH (ref 65–99)

## 2015-09-05 LAB — COMPREHENSIVE METABOLIC PANEL
ALT: 42 U/L (ref 17–63)
AST: 41 U/L (ref 15–41)
Albumin: 2.6 g/dL — ABNORMAL LOW (ref 3.5–5.0)
Alkaline Phosphatase: 55 U/L (ref 38–126)
Anion gap: 17 — ABNORMAL HIGH (ref 5–15)
BUN: 147 mg/dL — ABNORMAL HIGH (ref 6–20)
CO2: 21 mmol/L — ABNORMAL LOW (ref 22–32)
Calcium: 7.8 mg/dL — ABNORMAL LOW (ref 8.9–10.3)
Chloride: 97 mmol/L — ABNORMAL LOW (ref 101–111)
Creatinine, Ser: 5.26 mg/dL — ABNORMAL HIGH (ref 0.61–1.24)
GFR calc Af Amer: 11 mL/min — ABNORMAL LOW (ref >60)
GFR calc non Af Amer: 9 mL/min — ABNORMAL LOW (ref >60)
Glucose, Bld: 131 mg/dL — ABNORMAL HIGH (ref 65–99)
Potassium: 2.9 mmol/L — ABNORMAL LOW (ref 3.5–5.1)
Sodium: 135 mmol/L (ref 135–145)
Total Bilirubin: 0.9 mg/dL (ref 0.3–1.2)
Total Protein: 5.4 g/dL — ABNORMAL LOW (ref 6.5–8.1)

## 2015-09-05 LAB — CULTURE, RESPIRATORY W GRAM STAIN
Culture: NORMAL
Special Requests: NORMAL

## 2015-09-05 LAB — PROCALCITONIN: Procalcitonin: 36.57 ng/mL

## 2015-09-05 LAB — APTT: aPTT: 66 seconds — ABNORMAL HIGH (ref 24–37)

## 2015-09-05 LAB — HEPARIN LEVEL (UNFRACTIONATED): Heparin Unfractionated: 0.52 IU/mL (ref 0.30–0.70)

## 2015-09-05 LAB — 10-HYDROXYCARBAZEPINE: Triliptal/MTB(Oxcarbazepin): 24 ug/mL (ref 10–35)

## 2015-09-05 MED ORDER — METOLAZONE 10 MG PO TABS
10.0000 mg | ORAL_TABLET | Freq: Every day | ORAL | Status: AC
  Administered 2015-09-05: 10 mg via ORAL
  Filled 2015-09-05: qty 1

## 2015-09-05 MED ORDER — HEPARIN (PORCINE) IN NACL 100-0.45 UNIT/ML-% IJ SOLN
1150.0000 [IU]/h | INTRAMUSCULAR | Status: DC
  Administered 2015-09-05 – 2015-09-06 (×2): 1250 [IU]/h via INTRAVENOUS
  Administered 2015-09-07 – 2015-09-08 (×2): 1150 [IU]/h via INTRAVENOUS
  Filled 2015-09-05 (×8): qty 250

## 2015-09-05 MED ORDER — POTASSIUM CHLORIDE 20 MEQ/15ML (10%) PO SOLN
40.0000 meq | Freq: Once | ORAL | Status: AC
  Administered 2015-09-05: 40 meq
  Filled 2015-09-05: qty 30

## 2015-09-05 MED ORDER — POTASSIUM CHLORIDE 20 MEQ/15ML (10%) PO SOLN
40.0000 meq | Freq: Once | ORAL | Status: AC
  Administered 2015-09-05: 40 meq via ORAL
  Filled 2015-09-05: qty 30

## 2015-09-05 MED ORDER — FUROSEMIDE 10 MG/ML IJ SOLN
8.0000 mg/h | INTRAVENOUS | Status: DC
  Administered 2015-09-06: 8 mg/h via INTRAVENOUS
  Filled 2015-09-05 (×2): qty 25

## 2015-09-05 NOTE — Progress Notes (Signed)
Subjective: contineus to improve. Denies headaches  Exam: Filed Vitals:   09/05/15 0700 09/05/15 0749  BP: 114/54 126/55  Pulse: 74 67  Temp: 98.1 F (36.7 C)   Resp: 18 18   Gen: In bed, NAD Resp: non-labored breathing, no acute distress Abd: soft, nt  Neuro: MS: awake, follows commands, nods and shakes head to quesitons.  CN: fixates and tracks across midline in both directions.  Motor: follows commands x 4.   Impression: 80 yo M with likely septic encephalopathy. Now improving on antibiotics. Given his improvement, I think MRI wou dlbe low yield and would not pursue unless he has problems after extubation.   Recommendations: 1) continue treatment of underlying infection.  2) d/c MRI 3) Please call if there are concerns after extubation.    Roland Rack, MD [facility] [phone]  If 7pm- 7am, please page neurology on call as listed in [place].

## 2015-09-05 NOTE — Progress Notes (Signed)
ANTICOAGULATION CONSULT NOTE - Follow Up Consult  Pharmacy Consult for Heparin  Indication: atrial fibrillation  Allergies  Allergen Reactions  . Decongestant  [Oxymetazoline]     Patient Measurements: Height: 5\' 8"  (172.7 cm) Weight: 219 lb 12.8 oz (99.7 kg) IBW/kg (Calculated) : 68.4  Vital Signs: Temp: 98.6 F (37 C) (04/13 2200) Temp Source: Oral (04/13 2000) BP: 127/51 mmHg (04/13 2200) Pulse Rate: 76 (04/13 2200)  Labs:  Recent Labs  09/03/15 0306 09/03/15 0641  09/03/15 1816 09/03/15 1817  09/04/15 0453 09/04/15 0900 09/04/15 1754 09/05/15 0600 09/05/15 2256  HGB 7.9*  --   --  7.9*  --   --  7.7*  --   --  7.7*  --   HCT 25.1*  --   --  24.1*  --   --  24.8*  --   --  23.6*  --   PLT 354  --   --   --   --   --  351  --   --  322  --   APTT 38*  --   --   --   --   --  35  --   --   --  66*  LABPROT 28.4*  --   --   --   --   --  23.0*  --   --   --   --   INR 2.72*  --   --   --   --   --  2.05*  --   --   --   --   HEPARINUNFRC  --   --   --   --   --   --   --   --   --   --  0.52  CREATININE 5.40*  --   < >  --  5.10*  < > 5.22* 5.19* 4.91* 5.26*  --   CKTOTAL  --  279  --   --   --   --   --   --   --   --   --   TROPONINI  --  0.06*  --   --  0.07*  --   --   --   --   --   --   < > = values in this interval not displayed.  Estimated Creatinine Clearance: 13.2 mL/min (by C-G formula based on Cr of 5.26).    Assessment: Heparin for afib while Xarelto on hold, aPTT is on low end of therapeutic range, heparin level may still have some residual Xarelto influence but aPTT/HL are close to correlation, will still check both with AM labs.   Goal of Therapy:  Heparin level 0.3-0.7 units/ml aPTT 66-102 seconds Monitor platelets by anticoagulation protocol: Yes   Plan:  -Cont heparin at 1250 units/hr -Confirmatory HL/aPTT with AM labs -Daily CBC/HL/aPTT -Monitor for bleeding -Continue aPTT/HL monitoring until correlation  [place], [place] 09/05/2015,11:30 PM

## 2015-09-05 NOTE — Procedures (Signed)
Extubation Procedure Note  Patient Details:   Name: Vincent Mason DOB: 1936-04-28 MRN: [id number]   Airway Documentation:     Evaluation  O2 sats: stable throughout Complications: No apparent complications Patient did tolerate procedure well. Bilateral Breath Sounds: Clear, Diminished   Yes   Patient extubated to [place]. No complications. Vital signs stable throughout. Patient tolerated well. RN at bedside. RT will continue to monitor.  Mcneil Sober 09/05/2015, 2:53 PM

## 2015-09-05 NOTE — Progress Notes (Signed)
At approximately 2 PM, fentanyl gtt discontinued and 83mL wasted in sink with Caro Hight RN.

## 2015-09-05 NOTE — Progress Notes (Signed)
ANTICOAGULATION CONSULT NOTE - Initial Consult  Pharmacy Consult for heparin Indication: atrial fibrillation  Allergies  Allergen Reactions  . Decongestant  [Oxymetazoline]     Patient Measurements: Height: 5\' 8"  (172.7 cm) Weight: 219 lb 12.8 oz (99.7 kg) IBW/kg (Calculated) : 68.4 Heparin Dosing Weight: 89.5kg  Vital Signs: Temp: 98.4 F (36.9 C) (04/13 1200) Temp Source: Core (Comment) (04/13 0400) BP: 105/52 mmHg (04/13 1200) Pulse Rate: 77 (04/13 1200)  Labs:  Recent Labs  09/03/15 0306 09/03/15 0641  09/03/15 1816 09/03/15 1817  09/04/15 0453 09/04/15 0900 09/04/15 1754 09/05/15 0600  HGB 7.9*  --   --  7.9*  --   --  7.7*  --   --  7.7*  HCT 25.1*  --   --  24.1*  --   --  24.8*  --   --  23.6*  PLT 354  --   --   --   --   --  351  --   --  322  APTT 38*  --   --   --   --   --  35  --   --   --   LABPROT 28.4*  --   --   --   --   --  23.0*  --   --   --   INR 2.72*  --   --   --   --   --  2.05*  --   --   --   CREATININE 5.40*  --   < >  --  5.10*  < > 5.Vincent* 5.19* 4.91* 5.26*  CKTOTAL  --  279  --   --   --   --   --   --   --   --   TROPONINI  --  0.06*  --   --  0.07*  --   --   --   --   --   < > = values in this interval not displayed.  Estimated Creatinine Clearance: 13.2 mL/min (by C-G formula based on Cr of 5.26).  Assessment: Vincent Mason on Xarelto PTA for AFib, was held on admission d/t extensive bleeding from likely falls at home. Last dose taken was 4/10. Now to start heparin for AFib.  INR yesterday 2.02, aPTT 35. Hgb 7.7, plts 322.  Goal of Therapy:  Heparin level 0.3-0.7 units/ml aPTT 66-102 seconds Monitor platelets by anticoagulation protocol: Yes   Plan:  -start heparin without bolus at 1250units/hr  -heparin level and aPTT in 8h to check for correlation. Xarelto may still be in system and affect HL d/t poor renal function and likely lack of clearance -daily HL (and aPTT until correlating) and CBC -follow for s/s bleeding and plans  to transition back to a PO anticoagulant  Janazia Schreier D. Kyreese Chio, PharmD, BCPS Clinical Pharmacist Pager: [phone] 09/05/2015 2:13 PM

## 2015-09-05 NOTE — Patient Instructions (Signed)

## 2015-09-05 NOTE — Progress Notes (Signed)
Pt having frequent PVCs, K low. MD Nelda Marseille notified, orders received.

## 2015-09-05 NOTE — Progress Notes (Signed)
PULMONARY / CRITICAL CARE MEDICINE   Name: RYLEIGH BEISEL MRN: [id number] DOB: 12/16/1935    ADMISSION DATE:  09/03/2015 CONSULTATION DATE:  09/03/2015  REFERRING MD:  EDP  CHIEF COMPLAINT:  Unresponsiveness  HISTORY OF PRESENT ILLNESS:   Mr. Vaswani is a 80 year old male with a past medical history of A. Fib on Xarelto, Chronic Diastolic Heart Failure, Pulmonary HTN, COPD Unknown Stage, CKD Stage III, possible history of seizures presents to the ED after being found unresponsive at home. Wife reports that he was his normal self yesterday, woke up around 2 AM to use the bathroom and fell. Wife found him unresponsive and called EMS. EMS reports he had unequal pupils with left larger than right. He was intubated on arrival to the ED due to acute respiratory failure. Wife reports several episodes of staring spells and several episodes of unresponsiveness that have lasted 5-10 minutes the past several months. He was admitted 12/2014 with similar presentation and thought to be having seizures at that time and started on Trileptal with neurology follow up.   SUBJECTIVE:  Improved pressors demand, diuresing.  VITAL SIGNS: BP 105/52 mmHg  Pulse 77  Temp(Src) 98.4 F (36.9 C) (Core (Comment))  Resp 17  Ht 5\' 8"  (1.727 m)  Wt 99.7 kg (219 lb 12.8 oz)  BMI 33.43 kg/m2  SpO2 100%  HEMODYNAMICS: CVP:  [14 mmHg-23 mmHg] 14 mmHg  VENTILATOR SETTINGS: Vent Mode:  [-] PRVC FiO2 (%):  [40 %-50 %] 40 % Set Rate:  [18 bmp] 18 bmp Vt Set:  [550 mL] 550 mL PEEP:  [5 cmH20-10 cmH20] 5 cmH20 Plateau Pressure:  [17 cmH20-25 cmH20] 17 cmH20  INTAKE / OUTPUT: I/O last 3 completed shifts: In: 3714.4 [I.V.:2474.4; NG/GT:1140; IV Piggyback:100] Out: [id number] [Urine:7170]  PHYSICAL EXAMINATION: General: vented, alert and interactive. Neuro: moves all ext purpsoeful, does fc this am  HEENT: ett, jvd increased, line clean, no sig hematoma PULM: coarse crackles CV: s1 s2 irR GI: soft, bs wnl, no  r Extremities: mild edema, lower ext ecchymosis  LABS:  BMET  Recent Labs Lab 09/04/15 0900 09/04/15 1754 09/05/15 0600  NA 132* 134* 135  K 4.4 3.6 2.9*  CL 97* 98* 97*  CO2 21* 21* 21*  BUN 130* 138* 147*  CREATININE 5.19* 4.91* 5.26*  GLUCOSE 154* 144* 131*    Electrolytes  Recent Labs Lab 09/03/15 1439 09/03/15 1817  09/04/15 0453 09/04/15 0900 09/04/15 1754 09/05/15 0600  CALCIUM  --  7.5*  < > 7.7* 7.7* 7.9* 7.8*  MG 2.2 2.1  --  2.3  --   --   --   PHOS 6.5* 6.5*  --  6.6*  --   --   --   < > = values in this interval not displayed.  CBC  Recent Labs Lab 09/03/15 0306 09/03/15 1816 09/04/15 0453 09/05/15 0600  WBC 11.3*  --  22.3* 20.2*  HGB 7.9* 7.9* 7.7* 7.7*  HCT 25.1* 24.1* 24.8* 23.6*  PLT 354  --  351 322    Coag's  Recent Labs Lab 09/03/15 0306 09/04/15 0453  APTT 38* 35  INR 2.72* 2.05*    Sepsis Markers  Recent Labs Lab 09/03/15 0641 09/03/15 1308 09/04/15 0453 09/05/15 0600  LATICACIDVEN  --  2.3*  --   --   PROCALCITON 10.78  --  48.10 36.57    ABG  Recent Labs Lab 09/03/15 1318 09/04/15 0959 09/05/15 0350  PHART 7.331* 7.313* 7.344*  PCO2ART 33.8* 40.4  39.2  PO2ART 114* 79.6* 66.5*    Liver Enzymes  Recent Labs Lab 09/03/15 0306 09/03/15 1816 09/04/15 0453 09/05/15 0600  AST 62* 70*  --  41  ALT 37 47  --  42  ALKPHOS 64 55  --  55  BILITOT 1.5* 0.9  --  0.9  ALBUMIN 3.4* 2.9* 2.8* 2.6*    Cardiac Enzymes  Recent Labs Lab 09/03/15 0641 09/03/15 1817  TROPONINI 0.06* 0.07*    Glucose  Recent Labs Lab 09/04/15 1549 09/04/15 2001 09/04/15 2352 09/05/15 0400 09/05/15 0742 09/05/15 1159  GLUCAP 117* 123* 132* 127* 115* 129*    Imaging Dg Chest Port 1 View  09/05/2015  CLINICAL DATA:  CHF, COPD, respiratory failure, renal failure. EXAM: PORTABLE CHEST 1 VIEW COMPARISON:  Portable chest x-ray of September 04, 2015 FINDINGS: The cardiac silhouette remains enlarged. The lungs are  well-expanded. There is hazy increased density in the right lower lobe more conspicuous today. There is no significant pleural effusion. The endotracheal tube tip lies approximately 6.3 cm above the carina. The esophagogastric tube tip projects below the inferior margin of the image. The right internal jugular venous catheter tip projects over the midportion of the SVC. IMPRESSION: Right lower lobe pneumonia. Mild cardiomegaly with mild central pulmonary vascular congestion but decreased pulmonary interstitial edema. The support tubes are in stable position. Electronically Signed   By: David  Martinique M.D.   On: 09/05/2015 07:56   STUDIES:  09/03/2015 CT Head/Cervical Spine: No acute intracranial findings. Negative for acute cervical spine fracture.  09/03/2015 CXR: Worsening changes of congestive heart failure with interstitial and alveolar edema 4/11 ct abdo/pelvis>>> neg acute 4/11MRI>>> 4/11 EEG>>>no focus 4/11 renal US>>>neg hydro  CULTURES: None  ANTIBIOTICS: None  SIGNIFICANT EVENTS: 4/11 - Admit 4/11 shock, neo rising 4/12- improved shock  LINES/TUBES: 09/03/2015 PIV Left Hand, Left Wrist 09/03/2015 Foley 09/03/2015 NG/OG Tube 09/03/2015 ETT>>> 4/11 rt IJ>>> 4/11 rt aline>>>  DISCUSSION: 80 year old male found unresponsive at home with acute respiratory failure, acute renal failure and acute anemia. History of possible seizure activity, chronic opioid/bzd dependence.   ASSESSMENT / PLAN:  PULMONARY A: Acute Hypoxic Respiratory Failure - dense bilateral infiltrates H/O COPD Questionable H/O Pulmonary HTN ASP PNA? P:   CXR in AM. SBT and ?extubation today. Diureses.  CARDIOVASCULAR A:  Chronic Diastolic HF (per PCP note EF normal in 2015) Septic shock H/O Atrial fibrillation - chronic anticoagulation w/ Xarelto H/O CAD s/p CABG H/O HTN H/O Hyperlipidemia  P:  Levophed is reducing, titrate down. Decrease Dopamine to 5 mcg drip. Diureses as below. Echo EF 60-65%  and moderate to severe pulmonary HTN. No role stress steroids, cortisol adequate.  RENAL A:   Acute on Chronic Renal Failure Stage III AG acidosis P:   KVO IVF Lasix 8 mg/hr x24 hours. CVP 14. Zaroxolyn 10 mg PO x1. Replace electrolytes as indicated. BMET in AM.  GASTROINTESTINAL A:   Questionable H/O GERD - On Zantac. Transaminitis - Mild.  R/o gastric stress on xarelto P:   TF to goal, will hold for extubation. PPI.  HEMATOLOGIC A:   Extensive Bruising - Likely secondary to Xarelto & falls. Anemia - Likely secondary to blood loss. Coagulopathy - Secondary to Xarelto & acute on chronic renal failure. Leukocytosis - Sepsis versus stress response.  P:  Am CBC Holding Xarelto Restart heparin drip.  INFECTIOUS A:   Septic shock, PNA asp? atypical Possible UTI - Few bacteria seen on UA  P:   Pct  rise noted (some from renal failure), pressors are better, clinically he is better Maintain current therapy  ENDOCRINE A:   No rel ai  P:   CBG D/ced stress dose steroids.  NEUROLOGIC A:   Acute Encephalopathy Recurrent Falls H/O Possible Seizures Chronic Narcotic & Benzodiazepine use  P:   MRI pending if when more stable for this EEG neg focus Neurology following RASS goal: 0  Hold fentanyl drip for weaning.  FAMILY  - No family bedside.  - Inter-disciplinary family meet or Palliative Care meeting due by:  09/10/2015  The patient is critically ill with multiple organ systems failure and requires high complexity decision making for assessment and support, frequent evaluation and titration of therapies, application of advanced monitoring technologies and extensive interpretation of multiple databases.   Critical Care Time devoted to patient care services described in this note is  35  Minutes. This time reflects time of care of this signee Dr Jennet Maduro. This critical care time does not reflect procedure time, or teaching time or supervisory time of  PA/NP/Med student/Med Resident etc but could involve care discussion time.  Rush Farmer, M.D. [facility] Pulmonary/Critical Care Medicine. Pager: [phone]. After hours pager: [phone].

## 2015-09-05 NOTE — Therapy (Signed)
[facility] [address] [place], [place], [address] Phone: [phone]   Fax:  [phone]  Occupational Therapy Evaluation, Treatment, and Discharge Summary  Patient Details  Name: Vincent Mason MRN: [id number] Date of Birth: 08/21/1935 No Data Recorded  Encounter Date: 09/02/2015      OT End of Session - 09/05/15 1232    Visit Number 1   Number of Visits 1   OT Start Time 1103   OT Stop Time 1144   OT Time Calculation (min) 41 min   Equipment Utilized During Treatment Lymphedema Management Self-Care Workbook   Activity Tolerance Patient limited by lethargy;Treatment limited secondary to medical complications (Comment)   Behavior During Therapy --  sleepinness and lethargy w/ decreased alertness throughout eval      Past Medical History  Diagnosis Date  . Hypertension   . Hyperlipidemia   . COPD (chronic obstructive pulmonary disease) (HCC)   . Kidney disease, chronic, stage III (moderate, EGFR 30-59 ml/min)   . Atrial fibrillation and flutter (HCC)     continue routine follow up with Dr. Kowalski    Past Surgical History  Procedure Laterality Date  . Cardiac surgery    . Hyperplastic colon polyp  02/2002    Sigmoid polyps  . Coronary artery bypass graft  10/1997  . History of cervical discectomy      C5-C6    There were no vitals filed for this visit.      Subjective Assessment - 09/05/15 1213    Subjective  Pt is referred by Donald E Fisher, MD, for Occupational Therapy evaluation and treatment of chronic, bilateral lower extremity/ bilateral  lower quadrant (BLE/BLQ) lymphedema (LE), affecting bilateral legs and scrotum. Pt is accompanied by his spouse, Linda,  today, who reports Pt has frequent falls and has difficulty lifting his legs because they are so heavy. Pt is unclear why  he is here today and is drowsy throughout session. Wife is reliable historian.   Patient is accompained by: Family member    Pertinent History LE PRECAUTIONS/ CDT CONTRAINDICATIONS: uncontrolled CHF, COPD, AFib, CAD, HCC, Valve incompetence, CKD (stage III),    Limitations difficulty walking, decreased endurance and activity tolerance (SOB during exertion), decreased functional mobiliity and transfers, needs assistance with all basic and instrumental ADLs,  difficulty fitting lowewr body clothing and street clothes, decreased alertness, decreased swallowing ability ( by spouse report),    Patient Stated Goals Decrease swelling   Currently in Pain? Other (Comment)  decreased alertness- no answer           [facility] OT Assessment - 09/05/15 0001    Mobility   Mobility Status History of falls;Needs assist   Mobility Status Comments arrives in transport chair   Cognition   Overall Cognitive Status History of cognitive impairments - at baseline   Memory Impaired   Awareness Impaired   Behaviors Other (comment)  sleepiness   Observation/Other Assessments   Skin Integrity BLE w/ moderate 4+ pitting below knees ; swelling extends from toes to groin and includes scrotum. Legs are reddened with lymphorrhea bilaterally. 3 open wounds draining on anterior and posterior surfaces of LLE. Large , quarter sized , water filled blister  at medial leg. Pinhole sized openings observed on LLE below knee anteriorly w/ lumphorrhea   Edema   Edema moderate stage II, BLE concentrated primarily beow knees with several contributing systemic factors, including CKD, CHF, open wounds, HCC, OSA,             LYMPHEDEMA/ONCOLOGY QUESTIONNAIRE - 09/05/15 1229    Date Lymphedema/Swelling Started   Date 06/07/15                OT Treatments/Exercises (OP) - 09/05/15 0001    Bed Mobility   Bed Mobility Not assessed   Transfers   Transfers Not assessed   ADLs   Overall ADLs Needs varying levels of assistance with all basic and instrumental ADLs   ADL Education Given Yes   Manual Therapy   Manual Therapy Edema management                OT Education - 09/05/15 1232    Education provided Yes   Education Details Provided Pt/caregiver skilled education and ADL training throughout visit for lymphedema etiology, progression, and treatment including Intensive and Management Phase Complete Decongestive Therapy (CDT)  Discussed lymphedema precautions, cellulitis risk, and all CDT and LE self-care components, including compression wrapping/ garments & devices, lymphatic pumping ther ex, simple self-MLD, and skin care. Provided printed Lymphedema Workbook for reference.   Person(s) Educated Patient;Spouse   Methods Explanation   Comprehension Verbalized understanding;Need further instruction             OT Long Term Goals - 09/05/15 1308    OT LONG TERM GOAL #1   Title Caregiver to demonstrate understanding of lymphedema etiology, progression, and treatment  plan, and verbalize understanding of need for referral to wound clinic before commencing CDT in an effort to limit infection risk and  limit progression.   Baseline dependent   Time 1   Period Days   Status Achieved               Plan - 09/05/15 1235    Clinical Impression Statement Pt presents w/ systemic swelling and moderate, stage II, BLE LE 2/2 complex constellation of contributing, medically complex conditions and environmental factors, including severe TR, pulmonary HTN, Afib, CHF, CKD ,OSA, obesity and gravity dependent positioning. Pt's attention and alertness fluctuated throughout session, so spouse reports onset ~ 4 months ago. Wife also notes that frequent falls exacerbates leg and scrotum swelling. Pt is not a candidate for Complete Decongestive Therapy at this time due to multiple open leg wounds with moderate lymphorrhea and increased infection risk. Pt is discharged from OT today and will benefit from an immediate referral to [facility]. Pt may resume LE care after wounds are closed and healed and Pt is medically stable. New OT  referral should note that Pt is stable medically able to tolerate increased fluid return load on the heart . Rehab potential at present is fair currently due to decreased cognitive status, limited social support, and burden of care. Once Pt is cleared to resume care, skilled Occupational Therapy for LE care is medically necessary to reduce uncontrolled swelling and associated pain/ discomfort, to decrease infection and falls risk, to improve knowledge and performance of LE self-care, to fit with appropriate, accessible, custom, BLE compression garments and devices, and to limit further progression. Without skilled Occupational Therapy for Intensive and Management phase Complete Decongestive Therapy (CDT) this patient's condition is likely to worsen and further functional decline is expected.   Rehab Potential Fair   OT Frequency --   OT Treatment/Interventions Self-care/ADL training;DME and/or AE instruction;Manual lymph drainage;Patient/family education;Compression bandaging;Therapeutic exercises;Therapeutic exercise;Therapeutic activities;Energy conservation;Manual Therapy      Patient will benefit from skilled therapeutic intervention in order to improve the following deficits and impairments:  Abnormal gait, Decreased cognition, Decreased knowledge of use of DME, Decreased  skin integrity, Increased edema, Cardiopulmonary status limiting activity, Decreased mobility, Other (comment), Decreased activity tolerance, Decreased endurance, Decreased strength, Decreased balance, Decreased knowledge of precautions, Decreased safety awareness, Difficulty walking, Impaired perceived functional ability, Obesity (decreased alerness and memory)  Visit Diagnosis: Lymphedema, not elsewhere classified      G-Codes - 09/05/15 1321    Functional Assessment Tool Used Clinical observation and examination, medical records review, Pt and caregiver interview, comparative limb volumetrics   Functional Limitation Self  care   Self Care Current Status (G8987) 100 percent impaired, limited or restricted   Self Care Goal Status (G8988) 100 percent impaired, limited or restricted   Self Care Discharge Status (G8989) 100 percent impaired, limited or restricted      Problem List Patient Active Problem List   Diagnosis Date Noted  . Status post peripherally inserted central catheter (PICC) central line placement   . Acute on chronic renal failure (HCC) 09/03/2015  . Normocytic anemia 09/03/2015  . Opioid dependence (HCC) 09/03/2015  . Pulmonary arterial hypertension (HCC) 09/03/2015  . Acute encephalopathy 09/03/2015  . Open leg wound 09/02/2015  . Lymphedema 08/26/2015  . Hypokalemia 08/26/2015  . Hyperuricemia 07/09/2015  . Coronary artery disease 01/22/2015  . Chronic kidney disease (CKD), stage III (moderate) 01/22/2015  . Chronic constipation 01/22/2015  . COPD (chronic obstructive pulmonary disease) (HCC) 01/22/2015  . ED (erectile dysfunction) of organic origin 01/22/2015  . Lymphedema of both lower extremities 01/22/2015  . Fatigue 01/22/2015  . Blood in the urine 01/22/2015  . Calculus of kidney 01/22/2015  . Memory loss 01/22/2015  . Hypertensive pulmonary vascular disease (HCC) 01/22/2015  . Displacement of cervical intervertebral disc without myelopathy 01/22/2015  . Seizures (HCC) 01/17/2015  . Obstructive sleep apnea 01/17/2015  . CHF (congestive heart failure) (HCC) 01/17/2015  . Atrial fibrillation (HCC) 01/17/2015  . Hypertension 01/17/2015  . Hyperlipidemia 01/17/2015  . Leg swelling 01/17/2015  . Degenerative disc disease, lumbar 01/17/2015  . Back pain 11/12/2014  . TI (tricuspid incompetence) 08/02/2014    Theresa Gilliam, MS, OTR/L, CLT-LANA 09/05/2015 1:30 PM   [facility] [address] [place], [place], [address] Phone: [phone]   Fax:  [phone]  Name: Dwight B Gallentine MRN: [id number] Date of Birth:  09/20/1935   

## 2015-09-06 ENCOUNTER — Inpatient Hospital Stay (HOSPITAL_COMMUNITY): Payer: Medicare Other

## 2015-09-06 ENCOUNTER — Ambulatory Visit: Payer: Medicare Other | Admitting: General Surgery

## 2015-09-06 DIAGNOSIS — I5020 Unspecified systolic (congestive) heart failure: Secondary | ICD-10-CM

## 2015-09-06 DIAGNOSIS — I159 Secondary hypertension, unspecified: Secondary | ICD-10-CM

## 2015-09-06 LAB — BASIC METABOLIC PANEL
Anion gap: 19 — ABNORMAL HIGH (ref 5–15)
Anion gap: 19 — ABNORMAL HIGH (ref 5–15)
BUN: 163 mg/dL — ABNORMAL HIGH (ref 6–20)
BUN: 158 mg/dL — ABNORMAL HIGH (ref 6–20)
CO2: 25 mmol/L (ref 22–32)
CO2: 26 mmol/L (ref 22–32)
Calcium: 8.2 mg/dL — ABNORMAL LOW (ref 8.9–10.3)
Calcium: 8.2 mg/dL — ABNORMAL LOW (ref 8.9–10.3)
Chloride: 95 mmol/L — ABNORMAL LOW (ref 101–111)
Chloride: 94 mmol/L — ABNORMAL LOW (ref 101–111)
Creatinine, Ser: 5.23 mg/dL — ABNORMAL HIGH (ref 0.61–1.24)
Creatinine, Ser: 4.79 mg/dL — ABNORMAL HIGH (ref 0.61–1.24)
GFR calc Af Amer: 11 mL/min — ABNORMAL LOW (ref >60)
GFR calc Af Amer: 12 mL/min — ABNORMAL LOW (ref >60)
GFR calc non Af Amer: 9 mL/min — ABNORMAL LOW (ref >60)
GFR calc non Af Amer: 11 mL/min — ABNORMAL LOW (ref >60)
Glucose, Bld: 117 mg/dL — ABNORMAL HIGH (ref 65–99)
Glucose, Bld: 109 mg/dL — ABNORMAL HIGH (ref 65–99)
Potassium: 3.2 mmol/L — ABNORMAL LOW (ref 3.5–5.1)
Potassium: 3 mmol/L — ABNORMAL LOW (ref 3.5–5.1)
Sodium: 139 mmol/L (ref 135–145)
Sodium: 139 mmol/L (ref 135–145)

## 2015-09-06 LAB — CBC
HCT: 23.1 % — ABNORMAL LOW (ref 39.0–52.0)
Hemoglobin: 7.5 g/dL — ABNORMAL LOW (ref 13.0–17.0)
MCH: 28.2 pg (ref 26.0–34.0)
MCHC: 32.5 g/dL (ref 30.0–36.0)
MCV: 86.8 fL (ref 78.0–100.0)
Platelets: 291 10*3/uL (ref 150–400)
RBC: 2.66 MIL/uL — ABNORMAL LOW (ref 4.22–5.81)
RDW: 17.5 % — ABNORMAL HIGH (ref 11.5–15.5)
WBC: 16.6 10*3/uL — ABNORMAL HIGH (ref 4.0–10.5)

## 2015-09-06 LAB — GLUCOSE, CAPILLARY
Glucose-Capillary: 109 mg/dL — ABNORMAL HIGH (ref 65–99)
Glucose-Capillary: 110 mg/dL — ABNORMAL HIGH (ref 65–99)
Glucose-Capillary: 120 mg/dL — ABNORMAL HIGH (ref 65–99)
Glucose-Capillary: 124 mg/dL — ABNORMAL HIGH (ref 65–99)
Glucose-Capillary: 137 mg/dL — ABNORMAL HIGH (ref 65–99)
Glucose-Capillary: 192 mg/dL — ABNORMAL HIGH (ref 65–99)

## 2015-09-06 LAB — PHOSPHORUS: Phosphorus: 7.2 mg/dL — ABNORMAL HIGH (ref 2.5–4.6)

## 2015-09-06 LAB — BLOOD GAS, ARTERIAL
Acid-Base Excess: 2.2 mmol/L — ABNORMAL HIGH (ref 0.0–2.0)
Bicarbonate: 26.1 mEq/L — ABNORMAL HIGH (ref 20.0–24.0)
O2 Saturation: 86.1 %
Patient temperature: 98.6
TCO2: 27.4 mmol/L (ref 0–100)
pCO2 arterial: 40.2 mmHg (ref 35.0–45.0)
pH, Arterial: 7.428 (ref 7.350–7.450)
pO2, Arterial: 54.1 mmHg — ABNORMAL LOW (ref 80.0–100.0)

## 2015-09-06 LAB — MAGNESIUM
Magnesium: 2.1 mg/dL (ref 1.7–2.4)
Magnesium: 1.9 mg/dL (ref 1.7–2.4)

## 2015-09-06 LAB — APTT: aPTT: 67 seconds — ABNORMAL HIGH (ref 24–37)

## 2015-09-06 LAB — HEPARIN LEVEL (UNFRACTIONATED)
Heparin Unfractionated: 0.61 IU/mL (ref 0.30–0.70)
Heparin Unfractionated: 0.76 IU/mL — ABNORMAL HIGH (ref 0.30–0.70)

## 2015-09-06 MED ORDER — NICARDIPINE HCL IN NACL 20-0.86 MG/200ML-% IV SOLN
3.0000 mg/h | INTRAVENOUS | Status: DC
Start: 2015-09-06 — End: 2015-09-06
  Administered 2015-09-06: 5 mg/h via INTRAVENOUS
  Filled 2015-09-06: qty 200

## 2015-09-06 MED ORDER — POTASSIUM CHLORIDE 20 MEQ/15ML (10%) PO SOLN
30.0000 meq | Freq: Once | ORAL | Status: AC
Start: 2015-09-06 — End: 2015-09-06
  Administered 2015-09-06: 30 meq via ORAL
  Filled 2015-09-06 (×2): qty 30

## 2015-09-06 MED ORDER — FUROSEMIDE 10 MG/ML IJ SOLN
40.0000 mg | Freq: Once | INTRAMUSCULAR | Status: AC
  Administered 2015-09-06: 40 mg via INTRAVENOUS
  Filled 2015-09-06: qty 4

## 2015-09-06 MED ORDER — DOPAMINE-DEXTROSE 3.2-5 MG/ML-% IV SOLN
0.0000 ug/kg/min | INTRAVENOUS | Status: DC
  Administered 2015-09-06: 5 ug/kg/min via INTRAVENOUS
  Administered 2015-09-07: 9.993 ug/kg/min via INTRAVENOUS
  Filled 2015-09-06 (×2): qty 250

## 2015-09-06 MED ORDER — POLYETHYLENE GLYCOL 3350 17 G PO PACK
17.0000 g | PACK | Freq: Every day | ORAL | Status: DC | PRN
  Administered 2015-09-06 – 2015-09-10 (×3): 17 g via ORAL
  Filled 2015-09-06 (×4): qty 1

## 2015-09-06 MED ORDER — POTASSIUM CHLORIDE 10 MEQ/100ML IV SOLN
10.0000 meq | INTRAVENOUS | Status: AC
  Administered 2015-09-06 (×4): 10 meq via INTRAVENOUS
  Filled 2015-09-06 (×4): qty 100

## 2015-09-06 NOTE — Progress Notes (Signed)
ANTICOAGULATION CONSULT NOTE - Follow Up Consult  Pharmacy Consult for Heparin  Indication: atrial fibrillation  Allergies  Allergen Reactions  . Decongestant  [Oxymetazoline]     Patient Measurements: Height: 5\' 8"  (172.7 cm) Weight: 205 lb 14.6 oz (93.4 kg) IBW/kg (Calculated) : 68.4  Vital Signs: Temp: 98.6 F (37 C) (04/14 2328) Temp Source: Oral (04/14 2328) BP: 136/54 mmHg (04/14 2300) Pulse Rate: 47 (04/14 2300)  Labs:  Recent Labs  09/04/15 0453  09/05/15 0600 09/05/15 2256 09/06/15 0409 09/06/15 0426 09/06/15 1515 09/06/15 2159  HGB 7.7*  --  7.7*  --   --  7.5*  --   --   HCT 24.8*  --  23.6*  --   --  23.1*  --   --   PLT 351  --  322  --   --  291  --   --   APTT 35  --   --  66*  --  67*  --   --   LABPROT 23.0*  --   --   --   --   --   --   --   INR 2.05*  --   --   --   --   --   --   --   HEPARINUNFRC  --   --   --  0.52 0.61  --   --  0.76*  CREATININE 5.22*  < > 5.26*  --   --  5.23* 4.79*  --   < > = values in this interval not displayed.  Estimated Creatinine Clearance: 14.1 mL/min (by C-G formula based on Cr of 4.79).    Assessment: Heparin for afib while Xarelto on hold, heparin level now slightly above goal, should be able to use HL to dose now  Goal of Therapy:  Heparin level 0.3-0.7 units/mL Monitor platelets by anticoagulation protocol: Yes   Plan:  -Reduce heparin to 1150 units/hr -0700 HL -Monitor for bleeding  Vincent Mason, Vincent Mason 09/06/2015,11:33 PM

## 2015-09-06 NOTE — Care Management Important Message (Signed)
Important Message  Patient Details  Name: Vincent Mason MRN: [id number] Date of Birth: 1935/11/30   Medicare Important Message Given:  Yes    Valencia Kassa Abena 09/06/2015, 11:07 AM

## 2015-09-06 NOTE — Progress Notes (Signed)
Rehab Admissions Coordinator Note:  Patient was screened by Retta Diones for appropriateness for an Inpatient Acute Rehab Consult.  At this time, we are recommending Inpatient Rehab consult.  Retta Diones 09/06/2015, 1:34 PM  I can be reached at [phone].

## 2015-09-06 NOTE — Progress Notes (Signed)
ANTICOAGULATION CONSULT NOTE - Follow Up Consult  Pharmacy Consult for Heparin  Indication: atrial fibrillation  Allergies  Allergen Reactions  . Decongestant  [Oxymetazoline]     Patient Measurements: Height: 5\' 8"  (172.7 cm) Weight: 219 lb 12.8 oz (99.7 kg) IBW/kg (Calculated) : 68.4  Vital Signs: Temp: 97.9 F (36.6 C) (04/14 0416) Temp Source: Oral (04/14 0416) BP: 106/47 mmHg (04/14 0400) Pulse Rate: 81 (04/14 0400)  Labs:  Recent Labs  09/03/15 0641  09/03/15 1817  09/04/15 0453 09/04/15 0900 09/04/15 1754 09/05/15 0600 09/05/15 2256 09/06/15 0409 09/06/15 0426  HGB  --   < >  --   --  7.7*  --   --  7.7*  --   --  7.5*  HCT  --   < >  --   --  24.8*  --   --  23.6*  --   --  23.1*  PLT  --   --   --   --  351  --   --  322  --   --  291  APTT  --   --   --   --  35  --   --   --  66*  --  67*  LABPROT  --   --   --   --  23.0*  --   --   --   --   --   --   INR  --   --   --   --  2.05*  --   --   --   --   --   --   HEPARINUNFRC  --   --   --   --   --   --   --   --  0.52 0.61  --   CREATININE  --   < > 5.10*  < > 5.22* 5.19* 4.91* 5.26*  --   --   --   CKTOTAL 279  --   --   --   --   --   --   --   --   --   --   TROPONINI 0.06*  --  0.07*  --   --   --   --   --   --   --   --   < > = values in this interval not displayed.  Estimated Creatinine Clearance: 13.2 mL/min (by C-G formula based on Cr of 5.26).    Assessment: Heparin for afib while Xarelto on hold, aPTT is on low end of therapeutic range x 2, heparin level may still have some residual Xarelto influence but aPTT/HL are close to correlation, will still check both for now.   Goal of Therapy:  Heparin level 0.3-0.7 units/ml aPTT 66-102 seconds Monitor platelets by anticoagulation protocol: Yes   Plan:  -Cont heparin at 1250 units/hr -Daily CBC/HL/aPTT -Monitor for bleeding -Continue aPTT/HL monitoring until correlation  Vincent Mason, Vincent Mason 09/06/2015,5:02 AM

## 2015-09-06 NOTE — Evaluation (Signed)
Clinical/Bedside Swallow Evaluation Patient Details  Name: Vincent Mason MRN: [id number] Date of Birth: [birth year]  Today's Date: 09/06/2015 Time: SLP Start Time (ACUTE ONLY): 0900 SLP Stop Time (ACUTE ONLY): 0912 SLP Time Calculation (min) (ACUTE ONLY): 12 min  Past Medical History:  Past Medical History  Diagnosis Date  . Hypertension   . Hyperlipidemia   . COPD (chronic obstructive pulmonary disease) ([facility])   . Kidney disease, chronic, stage III (moderate, EGFR 30-59 ml/min)   . Atrial fibrillation and flutter ([place])     continue routine follow up with Dr. Nehemiah Massed   Past Surgical History:  Past Surgical History  Procedure Laterality Date  . Cardiac surgery    . Hyperplastic colon polyp  02/2002    Sigmoid polyps  . Coronary artery bypass graft  10/1997  . History of cervical discectomy      C5-C55   HPI:  80 year old male found unresponsive at home with acute respiratory failure, acute renal failure and acute anemia. History of possible seizure activity, chronic opioid/bzd dependence. Intubated from 4/11 to 4/13.    Assessment / Plan / Recommendation Clinical Impression  Pt demosntrates adequate swallowing ability with soilds and liquids with minimal concern for aspiation. Pt is noted to have intermittent sigh/throat clear throughout assessment with all textures, not eliminated with nectar thick liquids. Does not appear related to aspiration. Pt may initiate a regular diet and thin liquids. Will f/u x1 for tolerance.     Aspiration Risk  Mild aspiration risk    Diet Recommendation Regular;Thin liquid   Liquid Administration via: Cup;Straw Medication Administration: Whole meds with liquid Supervision: Patient able to self feed Compensations: Slow rate;Small sips/bites Postural Changes: Seated upright at 90 degrees    Other  Recommendations     Follow up Recommendations       Frequency and Duration min 1 x/week  1 week       Prognosis Prognosis for Safe Diet  Advancement: Good      Swallow Study   General HPI: 80 year old male found unresponsive at home with acute respiratory failure, acute renal failure and acute anemia. History of possible seizure activity, chronic opioid/bzd dependence. Intubated from 4/11 to 4/13.  Type of Study: Bedside Swallow Evaluation Previous Swallow Assessment: none Diet Prior to this Study: NPO Temperature Spikes Noted: No Respiratory Status: Room air History of Recent Intubation: Yes Length of Intubations (days):  (3) Date extubated: 09/05/15 Behavior/Cognition: Alert;Cooperative Oral Cavity Assessment: Within Functional Limits Oral Care Completed by SLP: No Oral Cavity - Dentition: Adequate natural dentition Vision: Functional for self-feeding Self-Feeding Abilities: Able to feed self Patient Positioning: Upright in bed Baseline Vocal Quality: Hoarse Volitional Cough: Strong Volitional Swallow: Able to elicit    Oral/Motor/Sensory Function Overall Oral Motor/Sensory Function: Within functional limits   Ice Chips Ice chips: Within functional limits   Thin Liquid Thin Liquid: Within functional limits    Nectar Thick Nectar Thick Liquid: Not tested   Honey Thick Honey Thick Liquid: Not tested   Puree Puree: Within functional limits   Solid   GO   Solid: Within functional limits       [facility], MA CCC-SLP [phone]  Lynann Beaver 09/06/2015,9:40 AM

## 2015-09-06 NOTE — Progress Notes (Signed)
PULMONARY / CRITICAL CARE MEDICINE   Name: Vincent Mason MRN: [id number] DOB: Jun 04, 1935    ADMISSION DATE:  09/03/2015 CONSULTATION DATE:  09/03/2015  REFERRING MD:  EDP  CHIEF COMPLAINT:  Unresponsiveness  HISTORY OF PRESENT ILLNESS:   Vincent Mason is a 80 year old male with a past medical history of A. Fib on Xarelto, Chronic Diastolic Heart Failure, Pulmonary HTN, COPD Unknown Stage, CKD Stage III, possible history of seizures presents to the ED after being found unresponsive at home. Wife reports that he was his normal self yesterday, woke up around 2 AM to use the bathroom and fell. Wife found him unresponsive and called EMS. EMS reports he had unequal pupils with left larger than right. He was intubated on arrival to the ED due to acute respiratory failure. Wife reports several episodes of staring spells and several episodes of unresponsiveness that have lasted 5-10 minutes the past several months. He was admitted 12/2014 with similar presentation and thought to be having seizures at that time and started on Trileptal with neurology follow up.   SUBJECTIVE:  AAOx3, mildly confused, unable to follow conversation. No complaints.   VITAL SIGNS: BP 112/47 mmHg  Pulse 72  Temp(Src) 97.9 F (36.6 C) (Oral)  Resp 22  Ht 5\' 8"  (1.727 m)  Wt 205 lb 14.6 oz (93.4 kg)  BMI 31.32 kg/m2  SpO2 92%  HEMODYNAMICS: CVP:  [8 mmHg-14 mmHg] 8 mmHg  VENTILATOR SETTINGS: Vent Mode:  [-] PRVC FiO2 (%):  [40 %] 40 % Set Rate:  [18 bmp] 18 bmp Vt Set:  [550 mL] 550 mL PEEP:  [5 cmH20-10 cmH20] 5 cmH20 Plateau Pressure:  [17 cmH20-23 cmH20] 17 cmH20  INTAKE / OUTPUT: I/O last 3 completed shifts: In: 2927.7 [I.V.:1977.7; NG/GT:900; IV Piggyback:50] Out: [id number]  PHYSICAL EXAMINATION: General: alert and interactive. Neuro: moves all ext purpsoeful HEENT: ett, jvd increased, line clean, no sig hematoma PULM: coarse crackles CV: s1 s2 irR GI: soft, bs wnl, no r Extremities: mild  edema, lower ext ecchymosis  LABS:  BMET  Recent Labs Lab 09/04/15 1754 09/05/15 0600 09/06/15 0426  NA 134* 135 139  K 3.6 2.9* 3.2*  CL 98* 97* 95*  CO2 21* 21* 25  BUN 138* 147* 163*  CREATININE 4.91* 5.26* 5.23*  GLUCOSE 144* 131* 117*    Electrolytes  Recent Labs Lab 09/03/15 1817  09/04/15 0453  09/04/15 1754 09/05/15 0600 09/06/15 0426  CALCIUM 7.5*  < > 7.7*  < > 7.9* 7.8* 8.2*  MG 2.1  --  2.3  --   --   --  2.1  PHOS 6.5*  --  6.6*  --   --   --  7.2*  < > = values in this interval not displayed.  CBC  Recent Labs Lab 09/04/15 0453 09/05/15 0600 09/06/15 0426  WBC 22.3* 20.2* 16.6*  HGB 7.7* 7.7* 7.5*  HCT 24.8* 23.6* 23.1*  PLT 351 322 291    Coag's  Recent Labs Lab 09/03/15 0306 09/04/15 0453 09/05/15 2256 09/06/15 0426  APTT 38* 35 66* 67*  INR 2.72* 2.05*  --   --     Sepsis Markers  Recent Labs Lab 09/03/15 0641 09/03/15 1308 09/04/15 0453 09/05/15 0600  LATICACIDVEN  --  2.3*  --   --   PROCALCITON 10.78  --  48.10 36.57    ABG  Recent Labs Lab 09/04/15 0959 09/05/15 0350 09/06/15 0540  PHART 7.313* 7.344* 7.428  PCO2ART 40.4 39.2 40.2  PO2ART 79.6* 66.5* 54.1*    Liver Enzymes  Recent Labs Lab 09/03/15 0306 09/03/15 1816 09/04/15 0453 09/05/15 0600  AST 62* 70*  --  41  ALT 37 47  --  42  ALKPHOS 64 55  --  55  BILITOT 1.5* 0.9  --  0.9  ALBUMIN 3.4* 2.9* 2.8* 2.6*    Cardiac Enzymes  Recent Labs Lab 09/03/15 0641 09/03/15 1817  TROPONINI 0.06* 0.07*    Glucose  Recent Labs Lab 09/05/15 0742 09/05/15 1159 09/05/15 1539 09/05/15 1945 09/05/15 2348 09/06/15 0419  GLUCAP 115* 129* 119* 136* 124* 109*    Imaging Dg Chest Port 1 View  09/05/2015  CLINICAL DATA:  CHF, COPD, respiratory failure, renal failure. EXAM: PORTABLE CHEST 1 VIEW COMPARISON:  Portable chest x-ray of September 04, 2015 FINDINGS: The cardiac silhouette remains enlarged. The lungs are well-expanded. There is hazy  increased density in the right lower lobe more conspicuous today. There is no significant pleural effusion. The endotracheal tube tip lies approximately 6.3 cm above the carina. The esophagogastric tube tip projects below the inferior margin of the image. The right internal jugular venous catheter tip projects over the midportion of the SVC. IMPRESSION: Right lower lobe pneumonia. Mild cardiomegaly with mild central pulmonary vascular congestion but decreased pulmonary interstitial edema. The support tubes are in stable position. Electronically Signed   By: David  Martinique M.D.   On: 09/05/2015 07:56   STUDIES:  09/03/2015 CT Head/Cervical Spine: No acute intracranial findings. Negative for acute cervical spine fracture.  09/03/2015 CXR: Worsening changes of congestive heart failure with interstitial and alveolar edema 4/11 ct abdo/pelvis>>> neg acute 4/11 EEG>>>no focus 4/11 renal US>>>neg hydro  CULTURES: None  ANTIBIOTICS: None  SIGNIFICANT EVENTS: 4/11 - Admit 4/11 shock, neo rising 4/12- improved shock  LINES/TUBES: 09/03/2015 PIV Left Hand, Left Wrist 09/03/2015 Foley 09/03/2015 NG/OG Tube 09/03/2015 ETT>>>4/13 4/11 rt IJ>>> 4/11 rt aline>>>  DISCUSSION: 80 year old male found unresponsive at home with acute respiratory failure, acute renal failure and acute anemia. History of possible seizure activity, chronic opioid/bzd dependence.   ASSESSMENT / PLAN:  PULMONARY A: Acute Hypoxic Respiratory Failure - dense bilateral infiltrates H/O COPD Questionable H/O Pulmonary HTN ASP PNA? P:   Doing well post extubation on RA now D/C Lasix ggt  Lasix 40 mg IV daily  CARDIOVASCULAR A:  Chronic Diastolic HF (per PCP note EF normal in 2015) Septic shock H/O Atrial fibrillation - chronic anticoagulation w/ Xarelto H/O CAD s/p CABG H/O HTN H/O Hyperlipidemia  P:  Levophed off Wean Dopamine HR>60, SBP >90 Diureses as below. Echo EF 60-65% and moderate to severe pulmonary  HTN. No role stress steroids, cortisol adequate.  RENAL A:   Acute on Chronic Renal Failure Stage III AG acidosis P:   KVO IVF D/C Lasix ggt Replace electrolytes as indicated. BMET in AM, Mag in AM  GASTROINTESTINAL A:   Questionable H/O GERD - On Zantac. Transaminitis - Mild.  R/o gastric stress on xarelto P:   SLP eval PPI.  HEMATOLOGIC A:   Extensive Bruising - Likely secondary to Xarelto & falls. Anemia - Likely secondary to blood loss. Coagulopathy - Secondary to Xarelto & acute on chronic renal failure. Leukocytosis - Sepsis versus stress response.  P:  Am CBC Holding Xarelto Continue heparin drip.  INFECTIOUS A:   Septic shock, PNA asp? atypical Possible UTI - Few bacteria seen on UA P:   Pct rise noted (some from renal failure), pressors are better, clinically he  is better D/C ABX  ENDOCRINE A:   No rel ai P:   CBG D/ced stress dose steroids.  NEUROLOGIC A:   Acute Encephalopathy Recurrent Falls H/O Possible Seizures Chronic Narcotic & Benzodiazepine use  P:   D/C MRI EEG neg focus RASS goal: 0  Restart home Trileptal  FAMILY  - No family bedside.  - Inter-disciplinary family meet or Palliative Care meeting due by:  09/10/2015  Maryellen Pile, MD IMTS PGY-1 [phone]

## 2015-09-06 NOTE — Evaluation (Addendum)
Physical Therapy Evaluation Patient Details Name: DUWARD HICKOX MRN: [id number] DOB: 09/07/1935 Today's Date: 09/06/2015   History of Present Illness  pt presents with Encephalopathy, Respiratory Failure, and Renal Failure.  pt intubated 4/11-4/13.  pt with hx of A-fib, HF, Pulmonary HTN, COPD, CKD, and Seizures.    Clinical Impression  Pt generally weak and debilitated.  Pt's BPs 113/56 supine, 93/43 sitting, 68/57 right after SPT, 87/42 with feet elevated, and 102/44 after sitting ~58mins.  RN aware of BPs with pt on low dose pressors.  Pt is oriented to location and situation, but will make totally off topic and inaccurate comments during session.  At one point pt asked when his mother would be coming to visit and when asked his mother's age he stated she is 42 yrs old.  Pt also very difficult to stay on task and often grabbing lines.  Feel pt would benefit from CIR level of care at D/C to maximize independence and decrease burden of care.      Follow Up Recommendations CIR    Equipment Recommendations  None recommended by PT    Recommendations for Other Services Rehab consult     Precautions / Restrictions Precautions Precautions: Fall Precaution Comments: Watch BP.  Weaning off Pressors. Restrictions Weight Bearing Restrictions: No      Mobility  Bed Mobility Overal bed mobility: Needs Assistance;+2 for physical assistance Bed Mobility: Supine to Sit     Supine to sit: Mod assist;HOB elevated;+2 for physical assistance     General bed mobility comments: pt does A with bringing LEs towards EOB, but definitely needs2 person A for bringing trunk up to sitting.    Transfers Overall transfer level: Needs assistance Equipment used: 2 person hand held assist Transfers: Sit to/from [facility] Sit to Stand: Mod assist;+2 physical assistance Stand pivot transfers: Mod assist;+2 physical assistance;+2 safety/equipment       General transfer comment: pt does  particpate well with transfer, but has difficulty staying on task and tends to grab for lines.  Needs step-by-step cues for safety.  Ambulation/Gait                Stairs            Wheelchair Mobility    Modified Rankin (Stroke Patients Only)       Balance Overall balance assessment: Needs assistance Sitting-balance support: Bilateral upper extremity supported;Feet supported Sitting balance-Leahy Scale: Poor Sitting balance - Comments: Leans posteriorly, but does attempt to attend to balance, just needs MinA.  Postural control: Posterior lean Standing balance support: During functional activity Standing balance-Leahy Scale: Poor                               Pertinent Vitals/Pain Pain Assessment: Faces Faces Pain Scale: Hurts a little bit Pain Location: Upper Bil LEs Pain Descriptors / Indicators: Grimacing Pain Intervention(s): Monitored during session;Premedicated before session;Repositioned    Home Living Family/patient expects to be discharged to:: Inpatient rehab                      Prior Function Level of Independence: Independent               Hand Dominance        Extremity/Trunk Assessment   Upper Extremity Assessment: Defer to OT evaluation           Lower Extremity Assessment: Generalized weakness  Cervical / Trunk Assessment: Normal  Communication   Communication: No difficulties  Cognition Arousal/Alertness: Awake/alert Behavior During Therapy: WFL for tasks assessed/performed Overall Cognitive Status: No family/caregiver present to determine baseline cognitive functioning                      General Comments      Exercises        Assessment/Plan    PT Assessment Patient needs continued PT services  PT Diagnosis Difficulty walking;Generalized weakness;Altered mental status   PT Problem List Decreased strength;Decreased activity tolerance;Decreased balance;Decreased  mobility;Decreased coordination;Decreased cognition;Decreased safety awareness;Decreased knowledge of use of DME;Cardiopulmonary status limiting activity  PT Treatment Interventions DME instruction;Gait training;Functional mobility training;Therapeutic activities;Balance training;Therapeutic exercise;Neuromuscular re-education;Cognitive remediation;Patient/family education   PT Goals (Current goals can be found in the Care Plan section) Acute Rehab PT Goals Patient Stated Goal: Home "in a day or two". PT Goal Formulation: Patient unable to participate in goal setting Time For Goal Achievement: 09/20/15 Potential to Achieve Goals: Good    Frequency Min 3X/week   Barriers to discharge        Co-evaluation               End of Session Equipment Utilized During Treatment: Gait belt Activity Tolerance: Patient tolerated treatment well Patient left: in chair;with call bell/phone within reach;with chair alarm set;with nursing/sitter in room Nurse Communication: Mobility status (BPs)         Time: [id number] PT Time Calculation (min) (ACUTE ONLY): 24 min   Charges:   PT Evaluation $PT Eval Moderate Complexity: 1 Procedure PT Treatments $Therapeutic Activity: 8-22 mins   PT G CodesCatarina Hartshorn, [place] [phone] 09/06/2015, 12:54 PM

## 2015-09-07 LAB — MAGNESIUM: Magnesium: 2.1 mg/dL (ref 1.7–2.4)

## 2015-09-07 LAB — APTT
aPTT: 76 seconds — ABNORMAL HIGH (ref 24–37)
aPTT: 82 seconds — ABNORMAL HIGH (ref 24–37)

## 2015-09-07 LAB — BASIC METABOLIC PANEL
Anion gap: 19 — ABNORMAL HIGH (ref 5–15)
BUN: 159 mg/dL — ABNORMAL HIGH (ref 6–20)
CO2: 29 mmol/L (ref 22–32)
Calcium: 8.4 mg/dL — ABNORMAL LOW (ref 8.9–10.3)
Chloride: 93 mmol/L — ABNORMAL LOW (ref 101–111)
Creatinine, Ser: 5.22 mg/dL — ABNORMAL HIGH (ref 0.61–1.24)
GFR calc Af Amer: 11 mL/min — ABNORMAL LOW (ref >60)
GFR calc non Af Amer: 9 mL/min — ABNORMAL LOW (ref >60)
Glucose, Bld: 129 mg/dL — ABNORMAL HIGH (ref 65–99)
Potassium: 2.8 mmol/L — ABNORMAL LOW (ref 3.5–5.1)
Sodium: 141 mmol/L (ref 135–145)

## 2015-09-07 LAB — HEPARIN LEVEL (UNFRACTIONATED)
Heparin Unfractionated: 0.58 IU/mL (ref 0.30–0.70)
Heparin Unfractionated: 0.55 IU/mL (ref 0.30–0.70)

## 2015-09-07 LAB — GLUCOSE, CAPILLARY
Glucose-Capillary: 121 mg/dL — ABNORMAL HIGH (ref 65–99)
Glucose-Capillary: 126 mg/dL — ABNORMAL HIGH (ref 65–99)
Glucose-Capillary: 137 mg/dL — ABNORMAL HIGH (ref 65–99)
Glucose-Capillary: 138 mg/dL — ABNORMAL HIGH (ref 65–99)
Glucose-Capillary: 142 mg/dL — ABNORMAL HIGH (ref 65–99)
Glucose-Capillary: 156 mg/dL — ABNORMAL HIGH (ref 65–99)

## 2015-09-07 LAB — CBC
HCT: 23 % — ABNORMAL LOW (ref 39.0–52.0)
Hemoglobin: 7.5 g/dL — ABNORMAL LOW (ref 13.0–17.0)
MCH: 28.5 pg (ref 26.0–34.0)
MCHC: 32.6 g/dL (ref 30.0–36.0)
MCV: 87.5 fL (ref 78.0–100.0)
Platelets: 271 10*3/uL (ref 150–400)
RBC: 2.63 MIL/uL — ABNORMAL LOW (ref 4.22–5.81)
RDW: 17.9 % — ABNORMAL HIGH (ref 11.5–15.5)
WBC: 21 10*3/uL — ABNORMAL HIGH (ref 4.0–10.5)

## 2015-09-07 MED ORDER — OXCARBAZEPINE 150 MG PO TABS
150.0000 mg | ORAL_TABLET | Freq: Two times a day (BID) | ORAL | Status: DC
  Administered 2015-09-07 – 2015-09-17 (×21): 150 mg via ORAL
  Filled 2015-09-07 (×22): qty 1

## 2015-09-07 MED ORDER — POTASSIUM CHLORIDE 10 MEQ/100ML IV SOLN
10.0000 meq | INTRAVENOUS | Status: AC
Start: 2015-09-07 — End: 2015-09-07
  Administered 2015-09-07 (×4): 10 meq via INTRAVENOUS
  Filled 2015-09-07 (×4): qty 100

## 2015-09-07 MED ORDER — BISACODYL 10 MG RE SUPP
10.0000 mg | Freq: Once | RECTAL | Status: AC
  Administered 2015-09-07: 10 mg via RECTAL
  Filled 2015-09-07: qty 1

## 2015-09-07 MED ORDER — MINERAL OIL RE ENEM
1.0000 | ENEMA | Freq: Once | RECTAL | Status: AC
  Administered 2015-09-07: 1 via RECTAL
  Filled 2015-09-07: qty 1

## 2015-09-07 NOTE — Progress Notes (Signed)
Constipated/impacted. Given dulcolax and mineral oil enema without results. Dr Lamonte Sakai made aware. Will try soap suds enema.

## 2015-09-07 NOTE — Progress Notes (Signed)
PULMONARY / CRITICAL CARE MEDICINE   Name: Vincent Mason MRN: [id number] DOB: [birth year]    ADMISSION DATE:  09/03/2015 CONSULTATION DATE:  09/03/2015  REFERRING MD:  EDP  CHIEF COMPLAINT:  Unresponsiveness  HISTORY OF PRESENT ILLNESS:   Vincent Mason is a 80 year old male with a past medical history of A. Fib on Xarelto, Chronic Diastolic Heart Failure, Pulmonary HTN, COPD Unknown Stage, CKD Stage III, possible history of seizures presents to the ED after being found unresponsive at home. Wife reports that he was his normal self yesterday, woke up around 2 AM to use the bathroom and fell. Wife found him unresponsive and called EMS. EMS reports he had unequal pupils with left larger than right. He was intubated on arrival to the ED due to acute respiratory failure. Wife reports several episodes of staring spells and several episodes of unresponsiveness that have lasted 5-10 minutes the past several months. He was admitted 12/2014 with similar presentation and thought to be having seizures at that time and started on Trileptal with neurology follow up.   SUBJECTIVE:  AAOx3, mildly confused, unable to follow conversation. No complaints.   VITAL SIGNS: BP 114/41 mmHg  Pulse 86  Temp(Src) 98.4 F (36.9 C) (Oral)  Resp 18  Ht 5\' 8"  (1.727 m)  Wt 197 lb 12 oz (89.7 kg)  BMI 30.08 kg/m2  SpO2 95%  HEMODYNAMICS: CVP:  [2 mmHg-9 mmHg] 6 mmHg  VENTILATOR SETTINGS:    INTAKE / OUTPUT: I/O last 3 completed shifts: In: 2508.4 [P.O.:960; I.V.:1148.4; IV Piggyback:400] Out: 9005 [Urine:9005]  PHYSICAL EXAMINATION: General: alert and interactive. Neuro: moves all ext purpsoefulyl. Won't open eyes and make eye contact.  HEENT:   line clean, no sig hematoma PULM: decreased in bases CV: s1 s2 irR GI: tender, bs wnl, no bm, given supp. Extremities: mild edema, lower ext ecchymosis. Changed pvd  LABS:  BMET  Recent Labs Lab 09/06/15 0426 09/06/15 1515 09/07/15 0430  NA 139 139 141  K  3.2* 3.0* 2.8*  CL 95* 94* 93*  CO2 25 26 29   BUN 163* 158* 159*  CREATININE 5.23* 4.79* 5.22*  GLUCOSE 117* 109* 129*    Electrolytes  Recent Labs Lab 09/03/15 1817  09/04/15 0453  09/06/15 0426 09/06/15 1515 09/06/15 1810 09/07/15 0430  CALCIUM 7.5*  < > 7.7*  < > 8.2* 8.2*  --  8.4*  MG 2.1  --  2.3  --  2.1  --  1.9 2.1  PHOS 6.5*  --  6.6*  --  7.2*  --   --   --   < > = values in this interval not displayed.  CBC  Recent Labs Lab 09/05/15 0600 09/06/15 0426 09/07/15 0430  WBC 20.2* 16.6* 21.0*  HGB 7.7* 7.5* 7.5*  HCT 23.6* 23.1* 23.0*  PLT 322 291 271    Coag's  Recent Labs Lab 09/03/15 0306 09/04/15 0453 09/05/15 2256 09/06/15 0426 09/07/15 0430  APTT 38* 35 66* 67* 76*  INR 2.72* 2.05*  --   --   --     Sepsis Markers  Recent Labs Lab 09/03/15 0641 09/03/15 1308 09/04/15 0453 09/05/15 0600  LATICACIDVEN  --  2.3*  --   --   PROCALCITON 10.78  --  48.10 36.57    ABG  Recent Labs Lab 09/04/15 0959 09/05/15 0350 09/06/15 0540  PHART 7.313* 7.344* 7.428  PCO2ART 40.4 39.2 40.2  PO2ART 79.6* 66.5* 54.1*    Liver Enzymes  Recent Labs Lab  09/03/15 0306 09/03/15 1816 09/04/15 0453 09/05/15 0600  AST 62* 70*  --  41  ALT 37 47  --  42  ALKPHOS 64 55  --  55  BILITOT 1.5* 0.9  --  0.9  ALBUMIN 3.4* 2.9* 2.8* 2.6*    Cardiac Enzymes  Recent Labs Lab 09/03/15 0641 09/03/15 1817  TROPONINI 0.06* 0.07*    Glucose  Recent Labs Lab 09/06/15 1130 09/06/15 1629 09/06/15 2036 09/06/15 2330 09/07/15 0312 09/07/15 0919  GLUCAP 192* 110* 137* 142* 121* 126*    Imaging Dg Abd Portable 1v  09/06/2015  CLINICAL DATA:  Abdominal pain EXAM: PORTABLE ABDOMEN - 1 VIEW COMPARISON:  CT abdomen and pelvis September 03, 2015 FINDINGS: There is a single loop of borderline prominent small bowel in the upper abdomen just to the left of midline. No other bowel dilatation is noted. There is no air-fluid level. No free air. A rectal  thermometer is in place. IMPRESSION: Single loop of slightly dilated small bowel. This finding could be indicative of early ileus or enteritis. Obstruction is not felt to be likely. No free air evident on this supine examination. Electronically Signed   By: Lowella Grip III M.D.   On: 09/06/2015 17:07   STUDIES:  09/03/2015 CT Head/Cervical Spine: No acute intracranial findings. Negative for acute cervical spine fracture.  09/03/2015 CXR: Worsening changes of congestive heart failure with interstitial and alveolar edema 4/11 ct abdo/pelvis>>> neg acute 4/11 EEG>>>no focus 4/11 renal US>>>neg hydro  CULTURES: None  ANTIBIOTICS: None  SIGNIFICANT EVENTS: 4/11 - Admit 4/11 shock, neo rising 4/12- improved shock 4/15 WEAN PRESSORS   LINES/TUBES: 09/03/2015 PIV Left Hand, Left Wrist 09/03/2015 Foley 09/03/2015 NG/OG Tube 09/03/2015 ETT>>>4/13 4/11 rt IJ>>> 4/11 rt aline>>>  DISCUSSION: 80 year old male found unresponsive at home with acute respiratory failure, acute renal failure and acute anemia. History of possible seizure activity, chronic opioid/bzd dependence.   ASSESSMENT / PLAN:  PULMONARY A: Acute Hypoxic Respiratory Failure - dense bilateral infiltrates H/O COPD Questionable H/O Pulmonary HTN ASP PNA? P:   Doing well post extubation on RA now  Lasix  Hold 4/15 may now be dry  CARDIOVASCULAR A:  Chronic Diastolic HF (per PCP note EF normal in 2015) Septic shock H/O Atrial fibrillation - chronic anticoagulation w/ Xarelto H/O CAD s/p CABG H/O HTN H/O Hyperlipidemia  P:  Levophed off Wean Dopamine HR>60, SBP >90, weaning Stop diuresis 4/15 Echo EF 60-65% and moderate to severe pulmonary HTN. No role stress steroids, cortisol adequate. Slight fluid challenge 4/15 to try to stay off pressors, cvp7  RENAL Lab Results  Component Value Date   CREATININE 5.22* 09/07/2015   CREATININE 4.79* 09/06/2015   CREATININE 5.23* 09/06/2015   CREATININE 1.6*  09/17/2014   CREATININE 1.61* 06/30/2014   CREATININE 1.17 09/01/2011   CREATININE 1.85* 08/31/2011    Recent Labs Lab 09/06/15 0426 09/06/15 1515 09/07/15 0430  K 3.2* 3.0* 2.8*     A:   Acute on Chronic Renal Failure Stage III AG acidosis P:   KVO IVF D/C Lasix Replace electrolytes as indicated. BMET   GASTROINTESTINAL A:   Questionable H/O GERD - On Zantac. Transaminitis - Mild.  R/o gastric stress on xarelto P:   SLP eval . Thin liquids  PPI.  HEMATOLOGIC  Recent Labs  09/06/15 0426 09/07/15 0430  HGB 7.5* 7.5*    A:   Extensive Bruising - Likely secondary to Xarelto & falls. Anemia - Likely secondary to blood loss. Coagulopathy -  Secondary to Xarelto & acute on chronic renal failure. Leukocytosis - Sepsis versus stress response.  P:  CBC Holding Xarelto Continue heparin drip.  INFECTIOUS A:   Septic shock, PNA asp? atypical Possible UTI - Few bacteria seen on UA Lower ext with PVD P:   D/C ABX, follow clinically  ENDOCRINE A:   No rel ai P:   CBG D/c'd stress dose steroids.  NEUROLOGIC A:   Acute Encephalopathy Recurrent Falls H/O Possible Seizures Chronic Narcotic & Benzodiazepine use  P:   D/C MRI EEG neg focus RASS goal: 0  Continue home Trileptal  FAMILY  - No family bedside.  - Inter-disciplinary family meet or Palliative Care meeting due by:  09/10/2015  Richardson Landry Minor ACNP Maryanna Shape PCCM Pager [phone] till 3 pm If no answer page [phone] 09/07/2015, 10:10 AM  Attending Note:  I have examined patient, reviewed labs, studies and notes. I have discussed the case with S Minor, and I agree with the data and plans as amended above.  Baltazar Apo, MD, PhD 09/07/2015, 1:42 PM [facility] [phone] or if no answer [phone]

## 2015-09-07 NOTE — Progress Notes (Signed)
ANTICOAGULATION CONSULT NOTE - Follow Up Consult  Pharmacy Consult for Heparin  Indication: atrial fibrillation  Allergies  Allergen Reactions  . Decongestant  [Oxymetazoline]     Patient Measurements: Height: 5\' 8"  (172.7 cm) Weight: 197 lb 12 oz (89.7 kg) IBW/kg (Calculated) : 68.4  Vital Signs: Temp: 98.3 F (36.8 C) (04/15 2019) Temp Source: Oral (04/15 2019) BP: 114/49 mmHg (04/15 1900) Pulse Rate: 90 (04/15 1900)  Labs:  Recent Labs  09/05/15 0600  09/06/15 0426 09/06/15 1515 09/06/15 2159 09/07/15 0430 09/07/15 0823 09/07/15 1954  HGB 7.7*  --  7.5*  --   --  7.5*  --   --   HCT 23.6*  --  23.1*  --   --  23.0*  --   --   PLT 322  --  291  --   --  271  --   --   APTT  --   < > 67*  --   --  76*  --  82*  HEPARINUNFRC  --   < >  --   --  0.76*  --  0.58 0.55  CREATININE 5.26*  --  5.23* 4.79*  --  5.22*  --   --   < > = values in this interval not displayed.  Estimated Creatinine Clearance: 12.7 mL/min (by C-G formula based on Cr of 5.22).   Assessment: Heparin for Afib while Xarelto on hold, heparin level and aptt appear to be correlating and were both therapeutic this morning. Xarelto last dose was the evening of 4/10, effect should be cleared from body.  HL 0.55, aptt 82 (at goal), no bleeding noted. HL appear to be correlating so will d/c aptts.  Goal of Therapy:  Heparin level 0.3-0.7 units/mL Monitor platelets by anticoagulation protocol: Yes    Plan:  -Continue heparin at 1150 units/hr -Daily HL, CBC  Erin Hearing PharmD., BCPS Clinical Pharmacist Pager [phone] 09/07/2015 9:10 PM

## 2015-09-07 NOTE — Progress Notes (Signed)
ANTICOAGULATION CONSULT NOTE - Follow Up Consult  Pharmacy Consult for Heparin  Indication: atrial fibrillation  Allergies  Allergen Reactions  . Decongestant  [Oxymetazoline]     Patient Measurements: Height: 5\' 8"  (172.7 cm) Weight: 197 lb 12 oz (89.7 kg) IBW/kg (Calculated) : 68.4  Vital Signs: Temp: 98.4 F (36.9 C) (04/15 0917) Temp Source: Oral (04/15 0917) BP: 114/41 mmHg (04/15 0645) Pulse Rate: 86 (04/15 0645)  Labs:  Recent Labs  09/05/15 0600  09/05/15 2256 09/06/15 0409 09/06/15 0426 09/06/15 1515 09/06/15 2159 09/07/15 0430 09/07/15 0823  HGB 7.7*  --   --   --  7.5*  --   --  7.5*  --   HCT 23.6*  --   --   --  23.1*  --   --  23.0*  --   PLT 322  --   --   --  291  --   --  271  --   APTT  --   --  66*  --  67*  --   --  76*  --   HEPARINUNFRC  --   < > 0.52 0.61  --   --  0.76*  --  0.58  CREATININE 5.26*  --   --   --  5.23* 4.79*  --  5.22*  --   < > = values in this interval not displayed.  Estimated Creatinine Clearance: 12.7 mL/min (by C-G formula based on Cr of 5.22).    Assessment: Heparin for Afib while Xarelto on hold, heparin level and aptt appear to be correlating and were both therapeutic this morning. Xarelto last dose was the evening of 4/10, effect should be cleared from body.   Goal of Therapy:  Heparin level 0.3-0.7 units/mL Monitor platelets by anticoagulation protocol: Yes    Plan:  -Continue heparin at 1150 units/hr -Check confirmatory level this afternono  -Daily HL, CBC   Vincent Mason, Vincent Mason 09/07/2015,9:39 AM

## 2015-09-08 ENCOUNTER — Inpatient Hospital Stay (HOSPITAL_COMMUNITY): Payer: Medicare Other

## 2015-09-08 LAB — BASIC METABOLIC PANEL
Anion gap: 15 (ref 5–15)
BUN: 144 mg/dL — ABNORMAL HIGH (ref 6–20)
CO2: 29 mmol/L (ref 22–32)
Calcium: 8 mg/dL — ABNORMAL LOW (ref 8.9–10.3)
Chloride: 97 mmol/L — ABNORMAL LOW (ref 101–111)
Creatinine, Ser: 4.45 mg/dL — ABNORMAL HIGH (ref 0.61–1.24)
GFR calc Af Amer: 13 mL/min — ABNORMAL LOW (ref >60)
GFR calc non Af Amer: 12 mL/min — ABNORMAL LOW (ref >60)
Glucose, Bld: 130 mg/dL — ABNORMAL HIGH (ref 65–99)
Potassium: 2.8 mmol/L — ABNORMAL LOW (ref 3.5–5.1)
Sodium: 141 mmol/L (ref 135–145)

## 2015-09-08 LAB — CULTURE, BLOOD (ROUTINE X 2)
Culture: NO GROWTH
Culture: NO GROWTH

## 2015-09-08 LAB — CBC
HCT: 21.7 % — ABNORMAL LOW (ref 39.0–52.0)
HCT: 23 % — ABNORMAL LOW (ref 39.0–52.0)
HCT: 23.4 % — ABNORMAL LOW (ref 39.0–52.0)
Hemoglobin: 6.9 g/dL — CL (ref 13.0–17.0)
Hemoglobin: 7.3 g/dL — ABNORMAL LOW (ref 13.0–17.0)
Hemoglobin: 7.2 g/dL — ABNORMAL LOW (ref 13.0–17.0)
MCH: 28 pg (ref 26.0–34.0)
MCH: 27.7 pg (ref 26.0–34.0)
MCH: 27.1 pg (ref 26.0–34.0)
MCHC: 31.8 g/dL (ref 30.0–36.0)
MCHC: 31.7 g/dL (ref 30.0–36.0)
MCHC: 30.8 g/dL (ref 30.0–36.0)
MCV: 88.2 fL (ref 78.0–100.0)
MCV: 87.1 fL (ref 78.0–100.0)
MCV: 88 fL (ref 78.0–100.0)
Platelets: 219 10*3/uL (ref 150–400)
Platelets: 182 10*3/uL (ref 150–400)
Platelets: 182 10*3/uL (ref 150–400)
RBC: 2.46 MIL/uL — ABNORMAL LOW (ref 4.22–5.81)
RBC: 2.64 MIL/uL — ABNORMAL LOW (ref 4.22–5.81)
RBC: 2.66 MIL/uL — ABNORMAL LOW (ref 4.22–5.81)
RDW: 17.9 % — ABNORMAL HIGH (ref 11.5–15.5)
RDW: 18.3 % — ABNORMAL HIGH (ref 11.5–15.5)
RDW: 18.8 % — ABNORMAL HIGH (ref 11.5–15.5)
WBC: 25.9 10*3/uL — ABNORMAL HIGH (ref 4.0–10.5)
WBC: 23.1 10*3/uL — ABNORMAL HIGH (ref 4.0–10.5)
WBC: 21.1 10*3/uL — ABNORMAL HIGH (ref 4.0–10.5)

## 2015-09-08 LAB — GLUCOSE, CAPILLARY
Glucose-Capillary: 139 mg/dL — ABNORMAL HIGH (ref 65–99)
Glucose-Capillary: 135 mg/dL — ABNORMAL HIGH (ref 65–99)
Glucose-Capillary: 126 mg/dL — ABNORMAL HIGH (ref 65–99)
Glucose-Capillary: 149 mg/dL — ABNORMAL HIGH (ref 65–99)

## 2015-09-08 LAB — HEPARIN LEVEL (UNFRACTIONATED): Heparin Unfractionated: 0.44 IU/mL (ref 0.30–0.70)

## 2015-09-08 LAB — MAGNESIUM: Magnesium: 2.1 mg/dL (ref 1.7–2.4)

## 2015-09-08 LAB — PHOSPHORUS: Phosphorus: 4 mg/dL (ref 2.5–4.6)

## 2015-09-08 LAB — PREPARE RBC (CROSSMATCH)

## 2015-09-08 MED ORDER — SODIUM CHLORIDE 0.9 % IV SOLN
Freq: Once | INTRAVENOUS | Status: AC
  Administered 2015-09-08: 06:00:00 via INTRAVENOUS

## 2015-09-08 MED ORDER — POTASSIUM CHLORIDE 10 MEQ/100ML IV SOLN
10.0000 meq | INTRAVENOUS | Status: AC
  Administered 2015-09-08 (×6): 10 meq via INTRAVENOUS
  Filled 2015-09-08 (×6): qty 100

## 2015-09-08 MED ORDER — PANTOPRAZOLE SODIUM 40 MG IV SOLR
40.0000 mg | INTRAVENOUS | Status: DC
  Administered 2015-09-09 – 2015-09-10 (×2): 40 mg via INTRAVENOUS
  Filled 2015-09-08 (×2): qty 40

## 2015-09-08 NOTE — Progress Notes (Signed)
eLink Physician-Brief Progress Note Patient Name: Vincent Mason DOB: Nov 14, 1935 MRN: [id number]   Date of Service  09/08/2015  HPI/Events of Note  Hgb drop from 7.5 to 6.9.  Patient is on heparin gtt.  eICU Interventions  Plan: Transfuse 1 unit pRBC Post-transfusion CBC Consider d/c of heparin if Hgb continues to drop     Intervention Category Intermediate Interventions: Other:  Liahna Brickner 09/08/2015, 4:26 AM

## 2015-09-08 NOTE — Progress Notes (Signed)
PULMONARY / CRITICAL CARE MEDICINE   Name: Vincent Mason MRN: [id number] DOB: [birth year]    ADMISSION DATE:  09/03/2015 CONSULTATION DATE:  09/03/2015  REFERRING MD:  EDP  CHIEF COMPLAINT:  Unresponsiveness  HISTORY OF PRESENT ILLNESS:   Vincent Mason is a 80 year old male with a past medical history of A. Fib on Xarelto, Chronic Diastolic Heart Failure, Pulmonary HTN, COPD Unknown Stage, CKD Stage III, possible history of seizures presents to the ED after being found unresponsive at home. Wife reports that he was his normal self yesterday, woke up around 2 AM to use the bathroom and fell. Wife found him unresponsive and called EMS. EMS reports he had unequal pupils with left larger than right. He was intubated on arrival to the ED due to acute respiratory failure. Wife reports several episodes of staring spells and several episodes of unresponsiveness that have lasted 5-10 minutes the past several months. He was admitted 12/2014 with similar presentation and thought to be having seizures at that time and started on Trileptal with neurology follow up.   SUBJECTIVE:  AAOx3, mildly confused, able to follow conversation. Complains of abdominal pain.   VITAL SIGNS: BP 114/62 mmHg  Pulse 86  Temp(Src) 98.2 F (36.8 C) (Oral)  Resp 31  Ht 5\' 8"  (1.727 m)  Wt 194 lb 7.1 oz (88.2 kg)  BMI 29.57 kg/m2  SpO2 93%  HEMODYNAMICS:    VENTILATOR SETTINGS:    INTAKE / OUTPUT: I/O last 3 completed shifts: In: 1773.5 [I.V.:873.5; IV [id number] Out: [id number] [Urine:4025; Stool:1]  PHYSICAL EXAMINATION: General: alert and interactive. Neuro: AAOx3, able to keep conversation and do simple calculations, moves all ext purpsoefulyl. No focal deficits HEENT:   line clean, no sig hematoma PULM: decreased in bases CV: s1 s2 irR GI: tender, bs wnl, non-distended. Ecchymosis over LL abdomen and across lower back Extremities: mild edema, lower ext ecchymosis. Changed pvd  LABS:  BMET  Recent  Labs Lab 09/06/15 1515 09/07/15 0430 09/08/15 0330  NA 139 141 141  K 3.0* 2.8* 2.8*  CL 94* 93* 97*  CO2 26 29 29   BUN 158* 159* 144*  CREATININE 4.79* 5.22* 4.45*  GLUCOSE 109* 129* 130*    Electrolytes  Recent Labs Lab 09/04/15 0453  09/06/15 0426 09/06/15 1515 09/06/15 1810 09/07/15 0430 09/08/15 0330  CALCIUM 7.7*  < > 8.2* 8.2*  --  8.4* 8.0*  MG 2.3  --  2.1  --  1.9 2.1 2.1  PHOS 6.6*  --  7.2*  --   --   --  4.0  < > = values in this interval not displayed.  CBC  Recent Labs Lab 09/06/15 0426 09/07/15 0430 09/08/15 0330  WBC 16.6* 21.0* 25.9*  HGB 7.5* 7.5* 6.9*  HCT 23.1* 23.0* 21.7*  PLT 291 271 219    Coag's  Recent Labs Lab 09/03/15 0306 09/04/15 0453  09/06/15 0426 09/07/15 0430 09/07/15 1954  APTT 38* 35  < > 67* 76* 82*  INR 2.72* 2.05*  --   --   --   --   < > = values in this interval not displayed.  Sepsis Markers  Recent Labs Lab 09/03/15 0641 09/03/15 1308 09/04/15 0453 09/05/15 0600  LATICACIDVEN  --  2.3*  --   --   PROCALCITON 10.78  --  48.10 36.57    ABG  Recent Labs Lab 09/04/15 0959 09/05/15 0350 09/06/15 0540  PHART 7.313* 7.344* 7.428  PCO2ART 40.4 39.2 40.2  PO2ART 79.6*  66.5* 54.1*    Liver Enzymes  Recent Labs Lab 09/03/15 0306 09/03/15 1816 09/04/15 0453 09/05/15 0600  AST 62* 70*  --  41  ALT 37 47  --  42  ALKPHOS 64 55  --  55  BILITOT 1.5* 0.9  --  0.9  ALBUMIN 3.4* 2.9* 2.8* 2.6*    Cardiac Enzymes  Recent Labs Lab 09/03/15 0641 09/03/15 1817  TROPONINI 0.06* 0.07*    Glucose  Recent Labs Lab 09/07/15 0919 09/07/15 1246 09/07/15 1627 09/07/15 2018 09/08/15 0109 09/08/15 0419  GLUCAP 126* 138* 137* 156* 139* 135*    Imaging Dg Chest Port 1 View  09/08/2015  CLINICAL DATA:  80 year old male with history of respiratory failure. History of hypertension, COPD and atrial fibrillation. Former smoker. EXAM: PORTABLE CHEST 1 VIEW COMPARISON:  Chest x-ray 09/06/2015.  FINDINGS: There is a right-sided internal jugular central venous catheter with tip terminating in the superior cavoatrial junction. Patient is in a very lordotic position. There is cephalization of the pulmonary vasculature and slight indistinctness of the interstitial markings suggestive of mild pulmonary edema. No pleural effusions. Moderate cardiomegaly. Upper mediastinal contours are within normal limits. Status post median sternotomy for CABG, including LIMA. IMPRESSION: 1. The appearance the chest suggests mild congestive heart failure, as above. 2. Atherosclerosis. Electronically Signed   By: [name] M.D.   On: 09/08/2015 07:57   STUDIES:  09/03/2015 CT Head/Cervical Spine: No acute intracranial findings. Negative for acute cervical spine fracture.  09/03/2015 CXR: Worsening changes of congestive heart failure with interstitial and alveolar edema 4/11 ct abdo/pelvis>>> neg acute 4/11 EEG>>>no focus 4/11 renal US>>>neg hydro  CULTURES: None  ANTIBIOTICS: None  SIGNIFICANT EVENTS: 4/11 - Admit 4/11 shock, neo rising 4/12- improved shock 4/15 WEAN PRESSORS   LINES/TUBES: 09/03/2015 PIV Left Hand, Left Wrist 09/03/2015 Foley 09/03/2015 NG/OG Tube 09/03/2015 ETT>>>4/13 4/11 rt IJ>>> 4/11 rt aline>>>  DISCUSSION: 80 year old male found unresponsive at home with acute respiratory failure, acute renal failure and acute anemia. History of possible seizure activity, chronic opioid/bzd dependence.   ASSESSMENT / PLAN:  PULMONARY A: Acute Hypoxic Respiratory Failure - dense bilateral infiltrates H/O COPD Questionable H/O Pulmonary HTN ASP PNA? P:   Doing well post extubation on RA now  Lasix  Hold 4/15-4/16 may now be dry, consider restarting tomorrow based on Cr levels  CARDIOVASCULAR A:  Chronic Diastolic HF (per PCP note EF normal in 2015) Septic shock H/O Atrial fibrillation - chronic anticoagulation w/ Xarelto H/O CAD s/p CABG H/O HTN H/O Hyperlipidemia  P:   Off pressors Stopped diuresis 4/15 Echo EF 60-65% and moderate to severe pulmonary HTN. No role stress steroids, cortisol adequate.  RENAL Lab Results  Component Value Date   CREATININE 4.45* 09/08/2015   CREATININE 5.22* 09/07/2015   CREATININE 4.79* 09/06/2015   CREATININE 1.6* 09/17/2014   CREATININE 1.61* 06/30/2014   CREATININE 1.17 09/01/2011   CREATININE 1.85* 08/31/2011    Recent Labs Lab 09/06/15 1515 09/07/15 0430 09/08/15 0330  K 3.0* 2.8* 2.8*    A:   Acute on Chronic Renal Failure Stage III AG acidosis P:   KVO IVF D/C Lasix Replace electrolytes as indicated. BMET  Consider restarting lasix tomorrow based off Cr  GASTROINTESTINAL A:   Questionable H/O GERD - On Zantac. Transaminitis - Mild.  R/o gastric stress on xarelto P:   SLP eval . Thin liquids  PPI. Having several BMs today  HEMATOLOGIC  Recent Labs  09/07/15 0430 09/08/15 0330  HGB  7.5* 6.9*    A:   Extensive Bruising - Likely secondary to Xarelto & falls. Anemia - Likely secondary to blood loss. Coagulopathy - Secondary to Xarelto & acute on chronic renal failure. Leukocytosis - Sepsis versus stress response.  P:  CBC - Hgb 6.9 - getting 1 unit PRBC Holding Xarelto Continue heparin drip. Consider stopping if Hgb continues to trend down  INFECTIOUS A:   Septic shock, PNA asp? atypical Possible UTI - Few bacteria seen on UA Lower ext with PVD P:   D/C ABX, follow clinically  ENDOCRINE A:   No rel ai P:   CBG D/c'd stress dose steroids.  NEUROLOGIC A:   Acute Encephalopathy Recurrent Falls H/O Possible Seizures Chronic Narcotic & Benzodiazepine use  P:   D/C MRI EEG neg focus RASS goal: 0  Continue home Trileptal  FAMILY  - No family bedside.  - Inter-disciplinary family meet or Palliative Care meeting due by:  09/10/2015  Maryellen Pile, MD IMTS PGY-1 [phone]

## 2015-09-08 NOTE — Progress Notes (Signed)
CRITICAL VALUE ALERT  Critical value received:  hgb 6.9   Date of notification:  09/08/2015  Time of notification:  [id number]  Critical value read back:Yes.    Nurse who received alert:  Woodroe Chen**  MD notified (1st page):  Dr. Jimmy Footman

## 2015-09-08 NOTE — Progress Notes (Signed)
eLink Physician-Brief Progress Note Patient Name: Vincent Mason DOB: May 29, 1935 MRN: [id number]   Date of Service  09/08/2015  HPI/Events of Note  Hypokalemia  eICU Interventions  Potassium replaced     Intervention Category Intermediate Interventions: Electrolyte abnormality - evaluation and management  DETERDING,ELIZABETH 09/08/2015, 5:01 AM

## 2015-09-08 NOTE — Progress Notes (Signed)
[place] for Heparin  Indication: atrial fibrillation  Allergies  Allergen Reactions  . Decongestant  [Oxymetazoline]     Patient Measurements: Height: 5\' 8"  (172.7 cm) Weight: 194 lb 7.1 oz (88.2 kg) IBW/kg (Calculated) : 68.4  Vital Signs: Temp: 98 F (36.7 C) (04/16 0915) Temp Source: Oral (04/16 0915) BP: 114/62 mmHg (04/16 1000) Pulse Rate: 86 (04/16 1000)  Labs:  Recent Labs  09/06/15 0426 09/06/15 1515  09/07/15 0430 09/07/15 0823 09/07/15 1954 09/08/15 0330  HGB 7.5*  --   --  7.5*  --   --  6.9*  HCT 23.1*  --   --  23.0*  --   --  21.7*  PLT 291  --   --  271  --   --  219  APTT 67*  --   --  76*  --  82*  --   HEPARINUNFRC  --   --   < >  --  0.58 0.55 0.44  CREATININE 5.23* 4.79*  --  5.22*  --   --  4.45*  < > = values in this interval not displayed.  Estimated Creatinine Clearance: 14.8 mL/min (by C-G formula based on Cr of 4.45).    Assessment: Heparin for Afib while Xarelto on hold, last dose was 4/10 evening. Heparin level is therapeutic. Hgb dropped to 6.9 this morning - pt receivd 1 unit prbc.   Goal of Therapy:  Heparin level 0.3-0.7 units/mL Monitor platelets by anticoagulation protocol: Yes    Plan:  -Continue heparin at 1150 units/hr -Daily HL, watch hgb closely, may need to consider holding heparin gtt   Harvel Quale 09/08/2015,10:08 AM

## 2015-09-09 DIAGNOSIS — R1013 Epigastric pain: Secondary | ICD-10-CM

## 2015-09-09 DIAGNOSIS — R109 Unspecified abdominal pain: Secondary | ICD-10-CM | POA: Diagnosis present

## 2015-09-09 LAB — COMPREHENSIVE METABOLIC PANEL
ALT: 31 U/L (ref 17–63)
AST: 45 U/L — ABNORMAL HIGH (ref 15–41)
Albumin: 2.6 g/dL — ABNORMAL LOW (ref 3.5–5.0)
Alkaline Phosphatase: 60 U/L (ref 38–126)
Anion gap: 12 (ref 5–15)
BUN: 111 mg/dL — ABNORMAL HIGH (ref 6–20)
CO2: 29 mmol/L (ref 22–32)
Calcium: 8 mg/dL — ABNORMAL LOW (ref 8.9–10.3)
Chloride: 100 mmol/L — ABNORMAL LOW (ref 101–111)
Creatinine, Ser: 3.54 mg/dL — ABNORMAL HIGH (ref 0.61–1.24)
GFR calc Af Amer: 18 mL/min — ABNORMAL LOW (ref >60)
GFR calc non Af Amer: 15 mL/min — ABNORMAL LOW (ref >60)
Glucose, Bld: 108 mg/dL — ABNORMAL HIGH (ref 65–99)
Potassium: 2.5 mmol/L — CL (ref 3.5–5.1)
Sodium: 141 mmol/L (ref 135–145)
Total Bilirubin: 1.2 mg/dL (ref 0.3–1.2)
Total Protein: 5.6 g/dL — ABNORMAL LOW (ref 6.5–8.1)

## 2015-09-09 LAB — BASIC METABOLIC PANEL
Anion gap: 13 (ref 5–15)
BUN: 97 mg/dL — ABNORMAL HIGH (ref 6–20)
CO2: 27 mmol/L (ref 22–32)
Calcium: 8.3 mg/dL — ABNORMAL LOW (ref 8.9–10.3)
Chloride: 103 mmol/L (ref 101–111)
Creatinine, Ser: 3.1 mg/dL — ABNORMAL HIGH (ref 0.61–1.24)
GFR calc Af Amer: 21 mL/min — ABNORMAL LOW (ref >60)
GFR calc non Af Amer: 18 mL/min — ABNORMAL LOW (ref >60)
Glucose, Bld: 121 mg/dL — ABNORMAL HIGH (ref 65–99)
Potassium: 2.7 mmol/L — CL (ref 3.5–5.1)
Sodium: 143 mmol/L (ref 135–145)

## 2015-09-09 LAB — TYPE AND SCREEN
ABO/RH(D): B POS
Antibody Screen: NEGATIVE
Unit division: 0

## 2015-09-09 LAB — OCCULT BLOOD X 1 CARD TO LAB, STOOL
Fecal Occult Bld: POSITIVE — AB
Fecal Occult Bld: POSITIVE — AB

## 2015-09-09 LAB — CBC
HCT: 23.8 % — ABNORMAL LOW (ref 39.0–52.0)
Hemoglobin: 7.5 g/dL — ABNORMAL LOW (ref 13.0–17.0)
MCH: 27.6 pg (ref 26.0–34.0)
MCHC: 31.5 g/dL (ref 30.0–36.0)
MCV: 87.5 fL (ref 78.0–100.0)
Platelets: 192 10*3/uL (ref 150–400)
RBC: 2.72 MIL/uL — ABNORMAL LOW (ref 4.22–5.81)
RDW: 18.7 % — ABNORMAL HIGH (ref 11.5–15.5)
WBC: 20.2 10*3/uL — ABNORMAL HIGH (ref 4.0–10.5)

## 2015-09-09 LAB — HEPARIN LEVEL (UNFRACTIONATED)
Heparin Unfractionated: <0.1 IU/mL — ABNORMAL LOW (ref 0.30–0.70)
Heparin Unfractionated: 0.23 IU/mL — ABNORMAL LOW (ref 0.30–0.70)

## 2015-09-09 LAB — GLUCOSE, CAPILLARY
Glucose-Capillary: 109 mg/dL — ABNORMAL HIGH (ref 65–99)
Glucose-Capillary: 171 mg/dL — ABNORMAL HIGH (ref 65–99)

## 2015-09-09 LAB — MAGNESIUM: Magnesium: 2.1 mg/dL (ref 1.7–2.4)

## 2015-09-09 MED ORDER — POTASSIUM CHLORIDE 10 MEQ/50ML IV SOLN
10.0000 meq | INTRAVENOUS | Status: AC
  Administered 2015-09-09 (×4): 10 meq via INTRAVENOUS
  Filled 2015-09-09 (×4): qty 50

## 2015-09-09 MED ORDER — HEPARIN (PORCINE) IN NACL 100-0.45 UNIT/ML-% IJ SOLN
1450.0000 [IU]/h | INTRAMUSCULAR | Status: DC
  Administered 2015-09-09: 1150 [IU]/h via INTRAVENOUS
  Administered 2015-09-10: 1450 [IU]/h via INTRAVENOUS
  Filled 2015-09-09 (×3): qty 250

## 2015-09-09 MED ORDER — POTASSIUM CHLORIDE CRYS ER 20 MEQ PO TBCR
60.0000 meq | EXTENDED_RELEASE_TABLET | Freq: Two times a day (BID) | ORAL | Status: DC
  Administered 2015-09-09 – 2015-09-17 (×17): 60 meq via ORAL
  Filled 2015-09-09 (×17): qty 3

## 2015-09-09 MED ORDER — GABAPENTIN 100 MG PO CAPS
100.0000 mg | ORAL_CAPSULE | Freq: Two times a day (BID) | ORAL | Status: DC
  Administered 2015-09-09 – 2015-09-17 (×17): 100 mg via ORAL
  Filled 2015-09-09 (×17): qty 1

## 2015-09-09 MED ORDER — MAGNESIUM SULFATE 2 GM/50ML IV SOLN
2.0000 g | Freq: Once | INTRAVENOUS | Status: AC
  Administered 2015-09-09: 2 g via INTRAVENOUS
  Filled 2015-09-09: qty 50

## 2015-09-09 MED ORDER — POTASSIUM CHLORIDE IN NACL 20-0.9 MEQ/L-% IV SOLN
INTRAVENOUS | Status: AC
Start: 2015-09-09 — End: 2015-09-10
  Administered 2015-09-09: 15:00:00 via INTRAVENOUS
  Filled 2015-09-09: qty 1000

## 2015-09-09 MED ORDER — DIAZEPAM 2 MG PO TABS
2.0000 mg | ORAL_TABLET | Freq: Two times a day (BID) | ORAL | Status: DC | PRN
  Administered 2015-09-09: 2 mg via ORAL
  Filled 2015-09-09: qty 1

## 2015-09-09 MED ORDER — ENSURE ENLIVE PO LIQD
237.0000 mL | Freq: Two times a day (BID) | ORAL | Status: DC
  Administered 2015-09-09 – 2015-09-16 (×8): 237 mL via ORAL

## 2015-09-09 MED ORDER — POTASSIUM CHLORIDE CRYS ER 20 MEQ PO TBCR: 60.0000 meq | EXTENDED_RELEASE_TABLET | Freq: Three times a day (TID) | ORAL | Status: DC

## 2015-09-09 NOTE — Progress Notes (Addendum)
Pt alert to name & birthday but does not make sense regarding other aspects. Reminded pt to call for bedpan or BSC when he has to have a Bowel movement. Come back into room several times when pt is digging in his rectum with kleenex. Tries to hand it to the staff. After pt bathed sent sample to lab for occult blood. Pt c/o leg pain like needles. MD ordered meds and pt medicated with valium 2 mg PO and started on Gabapentin

## 2015-09-09 NOTE — Progress Notes (Addendum)
ANTICOAGULATION CONSULT NOTE  Pharmacy Consult:  Heparin  Indication: atrial fibrillation  Allergies  Allergen Reactions  . Decongestant  [Oxymetazoline]     Patient Measurements: Height: 5\' 8"  (172.7 cm) Weight: 195 lb 1.7 oz (88.5 kg) IBW/kg (Calculated) : 68.4   Vital Signs: Temp: 99.3 F (37.4 C) (04/17 0600) Temp Source: Core (Comment) (04/17 0600) BP: 102/50 mmHg (04/17 0846) Pulse Rate: 82 (04/17 0846)  Labs:  Recent Labs  09/07/15 0430  09/07/15 1954 09/08/15 0330 09/08/15 1248 09/08/15 2111 09/09/15 0214 09/09/15 1225  HGB 7.5*  --   --  6.9* 7.3* 7.2* 7.5*  --   HCT 23.0*  --   --  21.7* 23.0* 23.4* 23.8*  --   PLT 271  --   --  219 182 182 192  --   APTT 76*  --  82*  --   --   --   --   --   HEPARINUNFRC  --   < > 0.55 0.44  --   --  <0.10*  --   CREATININE 5.22*  --   --  4.45*  --   --  3.54* 3.10*  < > = values in this interval not displayed.  Estimated Creatinine Clearance: 21.2 mL/min (by C-G formula based on Cr of 3.1).    Assessment: 80 YOM to resume IV heparin for Afib while Xarelto is on hold.  Patient has extensive bruising but no overt bleeding per MD.  Heparin infusion was held last night due to anemia and now to resume per TRH.  Noted +FOB.   Goal of Therapy:  Heparin level 0.3-0.7 units/mL Monitor platelets by anticoagulation protocol: Yes    Plan:  - Resume heparin gtt at 1150 units/hr - Check 8 hr HL - Daily HL / CBC - Monitor for bleeding, resolution of bruising    Julicia Krieger D. Mina Marble, PharmD, BCPS Pager:  [phone] 09/09/2015, 1:38 PM

## 2015-09-09 NOTE — Plan of Care (Signed)
Problem: Pain Managment: Goal: General experience of comfort will improve Outcome: Progressing Had patient and family discussion about replacing potassium and starting medication for his his restless legs see MAR.

## 2015-09-09 NOTE — Progress Notes (Signed)
ANTICOAGULATION CONSULT NOTE  Pharmacy Consult:  Heparin  Indication: atrial fibrillation  Allergies  Allergen Reactions  . Decongestant  [Oxymetazoline]     Patient Measurements: Height: 5\' 8"  (172.7 cm) Weight: 195 lb 1.7 oz (88.5 kg) IBW/kg (Calculated) : 68.4   Vital Signs: Temp: 98.8 F (37.1 C) (04/17 2200) BP: 129/75 mmHg (04/17 2200) Pulse Rate: 87 (04/17 2200)  Labs:  Recent Labs  09/07/15 0430  09/07/15 1954 09/08/15 0330 09/08/15 1248 09/08/15 2111 09/09/15 0214 09/09/15 1225 09/09/15 2155  HGB 7.5*  --   --  6.9* 7.3* 7.2* 7.5*  --   --   HCT 23.0*  --   --  21.7* 23.0* 23.4* 23.8*  --   --   PLT 271  --   --  219 182 182 192  --   --   APTT 76*  --  82*  --   --   --   --   --   --   HEPARINUNFRC  --   < > 0.55 0.44  --   --  <0.10*  --  0.23*  CREATININE 5.22*  --   --  4.45*  --   --  3.54* 3.10*  --   < > = values in this interval not displayed.  Estimated Creatinine Clearance: 21.2 mL/min (by C-G formula based on Cr of 3.1).   Assessment: 80 YOM to resume IV heparin for Afib while Xarelto is on hold.  Patient has extensive bruising but no overt bleeding per MD. HL is low at 0.23. No issues with heparin gtt noted.  Goal of Therapy:  Heparin level 0.3-0.7 units/mL Monitor platelets by anticoagulation protocol: Yes  Plan:  Increase heparin gtt to 1,300 units/hr Monitor daily HL, CBC, s/s of bleed Check 8 hr HL  Elenor Quinones, PharmD, [facility] Clinical Pharmacist Pager [phone] 09/09/2015 10:21 PM

## 2015-09-09 NOTE — Plan of Care (Signed)
Problem: Pain Managment: Goal: General experience of comfort will improve Outcome: Progressing Discussed pain management in relations to bilateral leg and hips with numbness and tingling reported to PCCM.

## 2015-09-09 NOTE — Progress Notes (Addendum)
[facility] PROGRESS NOTE  Vincent Mason [id number] DOB: [birth year] DOA: 09/03/2015   PCP: Lelon Huh, MD  Length of stay: 6   Assessment/Plan: Principal Problem:   Acute encephalopathy Active Problems:   Seizures ([place])   CHF (congestive heart failure) (HCC)   Atrial fibrillation ([place])   Hypertension   COPD (chronic obstructive pulmonary disease) ([place])   Acute on chronic renal failure (HCC)   Normocytic anemia   Opioid dependence ([place])   Pulmonary arterial hypertension ([place])   Status post peripherally inserted central catheter (PICC) central line placement   HISTORY OF PRESENT ILLNESS:  Vincent Mason is a 80 year old male with a past medical history of A. Fib on Xarelto, Chronic Diastolic Heart Failure, Pulmonary HTN, COPD Unknown Stage, CKD Stage III, possible history of seizures presents to the ED after being found unresponsive at home. Wife reports that he was his normal self yesterday, woke up around 2 AM to use the bathroom and fell. Wife found him unresponsive and called EMS. EMS reports he had unequal pupils with left larger than right. He was intubated on arrival to the ED due to acute respiratory failure. Wife reports several episodes of staring spells and several episodes of unresponsiveness that have lasted 5-10 minutes the past several months. He was admitted 12/2014 with similar presentation and thought to be having seizures at that time and started on Trileptal with neurology follow up.   Assessment and plan Acute Hypoxic Respiratory Failure - dense bilateral infiltrates H/O COPD Questionable H/O Pulmonary HTN ASP PNA? Doing well post extubation on RA now ,Extubated on 09/05/15 Lasix Hold 4/15-4/16 may now be dry, consider restarting tomorrow based on Cr levels completed treatment for pneumonia     Chronic Diastolic HF (per PCP note EF normal in 2015) Septic shock H/O Atrial fibrillation - chronic anticoagulation w/ Xarelto H/O CAD s/p CABG H/O  HTN H/O Hyperlipidemia Off pressors,dopamine has been weaned off Stopped diuresis 4/15 Echo EF 60-65% and moderate to severe pulmonary HTN. No role stress steroids, cortisol adequate. continue his heparin ,while waiting for improvement of his renal fn    RENAL improving ,likely ATN   Labs (Brief)    Lab Results  Component Value Date   CREATININE 4.45* 09/08/2015   CREATININE 5.22* 09/07/2015   CREATININE 4.79* 09/06/2015   CREATININE 1.6* 09/17/2014   CREATININE 1.61* 06/30/2014   CREATININE 1.17 09/01/2011   CREATININE 1.85* 08/31/2011    Urine outpt;2175 in 24 hours     Acute on Chronic Renal Failure Stage III AG acidosis KVO IVF D/C Lasix Replace electrolytes as indicated. BMET  Consider restarting lasix tomorrow based off Cr    Questionable H/O GERD - On Zantac. Transaminitis - Mild.  R/o gastric stress on xarelto SLP eval . Thin liquids  PPI. Having several BMs 4/16, no BM today    HEMATOLOGIC  Recent Labs (last 2 labs)      Recent Labs  09/07/15 0430 09/08/15 0330  HGB 7.5* 6.9*       Extensive Bruising - Likely secondary to Xarelto & falls. Anemia - Likely secondary to blood loss. Coagulopathy - Secondary to Xarelto & acute on chronic renal failure. Leukocytosis - Sepsis versus stress response.  CBC - Hgb 6.9 - getting 1 unit PRBC Holding Xarelto Continue heparin drip. Consider stopping if Hgb continues to trend down   Septic shock, PNA asp? atypical Possible UTI - Few bacteria seen on UA Lower ext with PVD D/C ABX,  follow clinically      Acute Encephalopathy Recurrent Falls H/O Possible Seizures Chronic Narcotic & Benzodiazepine use D/C MRI EEG neg focus RASS goal: 0  Continue home Trileptal            DVT prophylaxsis heparin gtt  Code Status:      Code Status Orders    Full code     Start     Ordered     Family Communication: Discussed in detail with the patient, all  imaging results, lab results explained to the patient   Disposition Plan: continue stepdown     Consultants:  PCCM   SIGNIFICANT EVENTS: 4/11 - Admit 4/11 shock, neo rising 4/12- improved shock 4/15 WEAN PRESSORS  LINES/TUBES: 09/03/2015 PIV Left Hand, Left Wrist 09/03/2015 Foley 09/03/2015 NG/OG Tube 09/03/2015 ETT>>>4/13 4/11 rt IJ>>> 4/11 rt aline>>>  Antibiotics: Levaquin 4/11-4/16 Vancomycin 4/11-4/14        HPI/Subjective: patient moaning and groaning,trying to pull his central line   Objective: Filed Vitals:   09/09/15 0443 09/09/15 0500 09/09/15 0600 09/09/15 0846  BP:  115/56 133/56 102/50  Pulse: 86 88  82  Temp: 99.3 F (37.4 C) 99.1 F (37.3 C) 99.3 F (37.4 C)   TempSrc:   Core (Comment)   Resp: 30 18 22  32  Height:      Weight:      SpO2: 97% 98%  99%    Intake/Output Summary (Last 24 hours) at 09/09/15 1316 Last data filed at 09/09/15 1200  Gross per 24 hour  Intake 498.46 ml  Output   2175 ml  Net -1676.54 ml    Exam:  Examination: General: alert and interactive. Neuro: AAOx3, able to keep conversation and do simple calculations, moves all ext purpsoefulyl. No focal deficits HEENT: line clean, no sig hematoma PULM: decreased in bases CV: s1 s2 irR GI: tender, bs wnl, non-distended. Ecchymosis over LL abdomen and across lower back Extremities: mild edema, lower ext ecchymosis. Changed pvd     Data Reviewed: I have personally reviewed following labs and imaging studies  Micro Results Recent Results (from the past 240 hour(s))  Culture, Urine     Status: Abnormal   Collection Time: 09/03/15  5:10 AM  Result Value Ref Range Status   Specimen Description URINE, CATHETERIZED  Final   Special Requests Normal  Final   Culture 8,000 COLONIES/mL INSIGNIFICANT GROWTH (A)  Final   Report Status 09/04/2015 FINAL  Final  Culture, blood (routine x 2)     Status: None   Collection Time: 09/03/15  7:00 AM  Result Value Ref  Range Status   Specimen Description BLOOD RIGHT ARM  Final   Special Requests   Final    BOTTLES DRAWN AEROBIC AND ANAEROBIC 10CC IN AEB 5CC IN ANA   Culture NO GROWTH 5 DAYS  Final   Report Status 09/08/2015 FINAL  Final  MRSA PCR Screening     Status: None   Collection Time: 09/03/15  7:11 AM  Result Value Ref Range Status   MRSA by PCR NEGATIVE NEGATIVE Final    Comment:        The GeneXpert MRSA Assay (FDA approved for NASAL specimens only), is one component of a comprehensive MRSA colonization surveillance program. It is not intended to diagnose MRSA infection nor to guide or monitor treatment for MRSA infections.   Culture, blood (routine x 2)     Status: None   Collection Time: 09/03/15  7:15 AM  Result Value Ref Range  Status   Specimen Description BLOOD RIGHT HAND  Final   Special Requests IN PEDIATRIC BOTTLE  3CC  Final   Culture NO GROWTH 5 DAYS  Final   Report Status 09/08/2015 FINAL  Final  Culture, respiratory (NON-Expectorated)     Status: None   Collection Time: 09/03/15  7:40 AM  Result Value Ref Range Status   Specimen Description TRACHEAL ASPIRATE  Final   Special Requests Normal  Final   Gram Stain   Final    FEW WBC PRESENT,BOTH PMN AND MONONUCLEAR MODERATE GRAM POSITIVE COCCI IN CHAINS Performed at [facility]    Culture   Final    NORMAL OROPHARYNGEAL FLORA Performed at [facility]    Report Status 09/05/2015 FINAL  Final  Respiratory virus panel     Status: None   Collection Time: 09/03/15  6:21 PM  Result Value Ref Range Status   Source - RVPAN NASAL SWAB  Corrected   Respiratory Syncytial Virus A Negative Negative Final   Respiratory Syncytial Virus B Negative Negative Final   Influenza A Negative Negative Final   Influenza B Negative Negative Final   Parainfluenza 1 Negative Negative Final   Parainfluenza 2 Negative Negative Final   Parainfluenza 3 Negative Negative Final   Metapneumovirus Negative Negative Final    Rhinovirus Negative Negative Final   Adenovirus Negative Negative Final    Comment: (NOTE) Performed At: [facility] [place], [place] [id number] Lindon Romp MD [id number]     Radiology Reports Ct Abdomen Pelvis Wo Contrast  09/03/2015  CLINICAL DATA:  Unresponsive. EXAM: CT ABDOMEN AND PELVIS WITHOUT CONTRAST TECHNIQUE: Multidetector CT imaging of the abdomen and pelvis was performed following the standard protocol without IV contrast. COMPARISON:  None. FINDINGS: Dense consolidation in both lung bases with air bronchograms. This could represent pneumonia, aspiration, hemorrhage. There are unremarkable unenhanced appearances of the liver, gallbladder, bile ducts, pancreas, spleen, adrenals and kidneys. Renal vascular calcifications are present. There are extensive atherosclerotic calcifications of the normal caliber aorta. There is a nasogastric tube extending into the stomach. Bowel is otherwise unremarkable. No bowel obstruction. No extraluminal air. No acute inflammatory changes are evident in the abdomen or pelvis. There is no ascites. No significant musculoskeletal lesion is evident. IMPRESSION: 1. No acute findings are evident in the abdomen or pelvis. 2. Dense consolidation in both lung bases Electronically Signed   By: Andreas Newport M.D.   On: 09/03/2015 06:05   Ct Head Wo Contrast  09/03/2015  CLINICAL DATA:  Unresponsive. EXAM: CT HEAD WITHOUT CONTRAST CT CERVICAL SPINE WITHOUT CONTRAST TECHNIQUE: Multidetector CT imaging of the head and cervical spine was performed following the standard protocol without intravenous contrast. Multiplanar CT image reconstructions of the cervical spine were also generated. COMPARISON:  01/17/2015 FINDINGS: CT HEAD FINDINGS There is no intracranial hemorrhage, mass or evidence of acute infarction. There is no extra-axial fluid collection. There is moderate generalized atrophy. Possible remote lacunar infarctions in the left  pons and left basal ganglia. No bony abnormality. CT CERVICAL SPINE FINDINGS The vertebral column, pedicles and facet articulations are intact. There is no evidence of acute fracture. No acute soft tissue abnormalities are evident. Mild motion degradation of the images.  Endotracheal tube noted. IMPRESSION: 1. No acute intracranial findings. Mild generalized atrophy and remote lacunar infarctions, unchanged. 2. Negative for acute cervical spine fracture. Mildly motion degraded study. Critical Value/emergent results were called by telephone at the time of interpretation on 09/03/2015 at 3:39 am  to Dr. Wallie Char, who verbally acknowledged these results. Electronically Signed   By: Andreas Newport M.D.   On: 09/03/2015 03:40   Ct Cervical Spine Wo Contrast  09/03/2015  CLINICAL DATA:  Unresponsive. EXAM: CT HEAD WITHOUT CONTRAST CT CERVICAL SPINE WITHOUT CONTRAST TECHNIQUE: Multidetector CT imaging of the head and cervical spine was performed following the standard protocol without intravenous contrast. Multiplanar CT image reconstructions of the cervical spine were also generated. COMPARISON:  01/17/2015 FINDINGS: CT HEAD FINDINGS There is no intracranial hemorrhage, mass or evidence of acute infarction. There is no extra-axial fluid collection. There is moderate generalized atrophy. Possible remote lacunar infarctions in the left pons and left basal ganglia. No bony abnormality. CT CERVICAL SPINE FINDINGS The vertebral column, pedicles and facet articulations are intact. There is no evidence of acute fracture. No acute soft tissue abnormalities are evident. Mild motion degradation of the images.  Endotracheal tube noted. IMPRESSION: 1. No acute intracranial findings. Mild generalized atrophy and remote lacunar infarctions, unchanged. 2. Negative for acute cervical spine fracture. Mildly motion degraded study. Critical Value/emergent results were called by telephone at the time of interpretation on 09/03/2015  at 3:39 am to Dr. Wallie Char, who verbally acknowledged these results. Electronically Signed   By: Andreas Newport M.D.   On: 09/03/2015 03:40   US Renal Port  09/04/2015  CLINICAL DATA:  Acute onset of renal insufficiency. EXAM: RENAL / URINARY TRACT ULTRASOUND COMPLETE COMPARISON:  CT of the abdomen and pelvis from 09/03/2015 FINDINGS: Right Kidney: Length: 11.8 cm. Slightly increased parenchymal echogenicity noted. No mass or hydronephrosis visualized. Left Kidney: Length: 13.0 cm. Slightly increased parenchymal echogenicity noted. No mass or hydronephrosis visualized. Bladder: Largely decompressed, with a Foley catheter in place. IMPRESSION: 1. No evidence of hydronephrosis. 2. Slightly increased renal parenchymal echogenicity could reflect mild medical renal disease. Electronically Signed   By: Garald Balding M.D.   On: 09/04/2015 02:55   Dg Chest Port 1 View  09/08/2015  CLINICAL DATA:  80 year old male with history of respiratory failure. History of hypertension, COPD and atrial fibrillation. Former smoker. EXAM: PORTABLE CHEST 1 VIEW COMPARISON:  Chest x-ray 09/06/2015. FINDINGS: There is a right-sided internal jugular central venous catheter with tip terminating in the superior cavoatrial junction. Patient is in a very lordotic position. There is cephalization of the pulmonary vasculature and slight indistinctness of the interstitial markings suggestive of mild pulmonary edema. No pleural effusions. Moderate cardiomegaly. Upper mediastinal contours are within normal limits. Status post median sternotomy for CABG, including LIMA. IMPRESSION: 1. The appearance the chest suggests mild congestive heart failure, as above. 2. Atherosclerosis. Electronically Signed   By: [name] M.D.   On: 09/08/2015 07:57   Dg Chest Port 1 View  09/06/2015  CLINICAL DATA:  Respiratory failure, recent extubation, patient is experiencing hemoptysis. History of CHF and COPD. EXAM: PORTABLE CHEST 1 VIEW  COMPARISON:  Portable chest x-ray of September 05, 2015 FINDINGS: The lungs are well-expanded. There is no focal infiltrate. The interstitial markings are coarse but improved in the right lower lobe. The cardiac silhouette remains enlarged. The pulmonary vascularity is prominent centrally. There is no significant pulmonary edema. No pleural effusion or pneumothorax is evident. The right internal jugular venous catheter tip projects over the midportion of the SVC. The patient has undergone previous median sternotomy. IMPRESSION: Improved aeration of both lungs. Persistent mild interstitial edema or pneumonia medially in the right lower lobe. Stable cardiomegaly and central pulmonary vascular congestion. Electronically Signed   By: Shanon Brow  [place] M.D.   On: 09/06/2015 07:29   Dg Chest Port 1 View  09/05/2015  CLINICAL DATA:  CHF, COPD, respiratory failure, renal failure. EXAM: PORTABLE CHEST 1 VIEW COMPARISON:  Portable chest x-ray of September 04, 2015 FINDINGS: The cardiac silhouette remains enlarged. The lungs are well-expanded. There is hazy increased density in the right lower lobe more conspicuous today. There is no significant pleural effusion. The endotracheal tube tip lies approximately 6.3 cm above the carina. The esophagogastric tube tip projects below the inferior margin of the image. The right internal jugular venous catheter tip projects over the midportion of the SVC. IMPRESSION: Right lower lobe pneumonia. Mild cardiomegaly with mild central pulmonary vascular congestion but decreased pulmonary interstitial edema. The support tubes are in stable position. Electronically Signed   By: David  [place] M.D.   On: 09/05/2015 07:56   Dg Chest Port 1 View  09/04/2015  CLINICAL DATA:  Respiratory failure, intubated EXAM: PORTABLE CHEST 1 VIEW COMPARISON:  09/03/2015 FINDINGS: Cardiomegaly again noted. Hazy bilateral lower lobe atelectasis or infiltrate right greater than left. No convincing pulmonary edema. Status  post CABG. NG tube in place. Endotracheal tube in place with tip 4.7 cm above the carina. Stable right IJ central line with tip in distal SVC. There is no pneumothorax. IMPRESSION: No convincing pulmonary edema. Status post CABG. Cardiomegaly again noted. Persistent bilateral lower lobe hazy atelectasis or infiltrate right greater than left. Stable support apparatus. Electronically Signed   By: Lahoma Crocker M.D.   On: 09/04/2015 11:40   Dg Chest Port 1 View  09/03/2015  CLINICAL DATA:  80 year old male status post PICC line placement. Initial encounter. EXAM: PORTABLE CHEST 1 VIEW COMPARISON:  CT Abdomen and Pelvis 0534 hours today. FINDINGS: Portable AP semi upright view at 1218 hours. Pacing pads overlie the chest. No PICC line is identified. There is a right IJ approach central line, tip at the level of the carina. Bilateral lung base consolidation re- demonstrated. There is cardiomegaly. Other mediastinal contours are within normal limits. Sequelae of CABG. No pneumothorax or pulmonary edema. No definite pleural effusion. IMPRESSION: 1. Right IJ central line placed, tip at the lower SVC level. No pneumothorax. 2. No PICC line identified. 3. Bilateral lower lobe consolidation compatible with bilateral pneumonia. Cardiomegaly. Electronically Signed   By: Genevie Ann M.D.   On: 09/03/2015 13:14   Dg Chest Port 1 View  09/03/2015  CLINICAL DATA:  Respiratory failure EXAM: PORTABLE CHEST 1 VIEW COMPARISON:  None. FINDINGS: The endotracheal tube is 5.2 cm above the carina. There is marked cardiomegaly, unchanged. There is extensive vascular and interstitial fullness which has worsened. There is worsening perihilar alveolar opacity, likely edema. No pneumothorax. No large effusions. IMPRESSION: Satisfactory ET tube position. Worsening changes of congestive heart failure with interstitial and alveolar edema. Electronically Signed   By: Andreas Newport M.D.   On: 09/03/2015 03:18   Dg Abd Portable 1v  09/06/2015   CLINICAL DATA:  Abdominal pain EXAM: PORTABLE ABDOMEN - 1 VIEW COMPARISON:  CT abdomen and pelvis September 03, 2015 FINDINGS: There is a single loop of borderline prominent small bowel in the upper abdomen just to the left of midline. No other bowel dilatation is noted. There is no air-fluid level. No free air. A rectal thermometer is in place. IMPRESSION: Single loop of slightly dilated small bowel. This finding could be indicative of early ileus or enteritis. Obstruction is not felt to be likely. No free air evident on this supine examination. Electronically Signed  By: Lowella Grip III M.D.   On: 09/06/2015 17:07     CBC  Recent Labs Lab 09/03/15 0306  09/04/15 0453 09/05/15 0600  09/07/15 0430 09/08/15 0330 09/08/15 1248 09/08/15 2111 09/09/15 0214  WBC 11.3*  --  22.3* 20.2*  < > 21.0* 25.9* 23.1* 21.1* 20.2*  HGB 7.9*  < > 7.7* 7.7*  < > 7.5* 6.9* 7.3* 7.2* 7.5*  HCT 25.1*  < > 24.8* 23.6*  < > 23.0* 21.7* 23.0* 23.4* 23.8*  PLT 354  --  351 322  < > 271 219 182 182 192  MCV 89.6  --  88.6 88.1  < > 87.5 88.2 87.1 88.0 87.5  MCH 28.2  --  27.5 28.7  < > 28.5 28.0 27.7 27.1 27.6  MCHC 31.5  --  31.0 32.6  < > 32.6 31.8 31.7 30.8 31.5  RDW 17.3*  --  17.3* 17.5*  < > 17.9* 17.9* 18.3* 18.8* 18.7*  LYMPHSABS 0.8  --  0.4* 1.0  --   --   --   --   --   --   MONOABS 0.4  --  1.0 0.9  --   --   --   --   --   --   EOSABS 0.0  --  0.0 0.1  --   --   --   --   --   --   BASOSABS 0.0  --  0.0 0.0  --   --   --   --   --   --   < > = values in this interval not displayed.  Chemistries   Recent Labs Lab 09/03/15 0306  09/03/15 1816  09/04/15 0453  09/05/15 0600 09/06/15 0426 09/06/15 1515 09/06/15 1810 09/07/15 0430 09/08/15 0330 09/09/15 0214 09/09/15 1225  NA 135  < >  --   < > 130*  < > 135 139 139  --  141 141 141 143  K 5.2*  < >  --   < > 4.6  < > 2.9* 3.2* 3.0*  --  2.8* 2.8* 2.5* 2.7*  CL 97*  < >  --   < > 97*  < > 97* 95* 94*  --  93* 97* 100* 103  CO2 16*  < >   --   < > 19*  < > 21* 25 26  --  29 29 29 27   GLUCOSE 116*  < >  --   < > 141*  < > 131* 117* 109*  --  129* 130* 108* 121*  BUN 133*  < >  --   < > 132*  < > 147* 163* 158*  --  159* 144* 111* 97*  CREATININE 5.40*  < >  --   < > 5.22*  < > 5.26* 5.23* 4.79*  --  5.22* 4.45* 3.54* 3.10*  CALCIUM 8.3*  < >  --   < > 7.7*  < > 7.8* 8.2* 8.2*  --  8.4* 8.0* 8.0* 8.3*  MG  --   < >  --   < > 2.3  --   --  2.1  --  1.9 2.1 2.1  --   --   AST 62*  --  70*  --   --   --  41  --   --   --   --   --  45*  --   ALT 37  --  47  --   --   --  42  --   --   --   --   --  31  --   ALKPHOS 64  --  55  --   --   --  55  --   --   --   --   --  60  --   BILITOT 1.5*  --  0.9  --   --   --  0.9  --   --   --   --   --  1.2  --   < > = values in this interval not displayed. ------------------------------------------------------------------------------------------------------------------ estimated creatinine clearance is 21.2 mL/min (by C-G formula based on Cr of 3.1). ------------------------------------------------------------------------------------------------------------------ No results for input(s): HGBA1C in the last 72 hours. ------------------------------------------------------------------------------------------------------------------ No results for input(s): CHOL, HDL, LDLCALC, TRIG, CHOLHDL, LDLDIRECT in the last 72 hours. ------------------------------------------------------------------------------------------------------------------ No results for input(s): TSH, T4TOTAL, T3FREE, THYROIDAB in the last 72 hours.  Invalid input(s): FREET3 ------------------------------------------------------------------------------------------------------------------ No results for input(s): VITAMINB12, FOLATE, FERRITIN, TIBC, IRON, RETICCTPCT in the last 72 hours.  Coagulation profile  Recent Labs Lab 09/03/15 0306 09/04/15 0453  INR 2.72* 2.05*    No results for input(s): DDIMER in the last 72  hours.  Cardiac Enzymes  Recent Labs Lab 09/03/15 0641 09/03/15 1817  TROPONINI 0.06* 0.07*   ------------------------------------------------------------------------------------------------------------------ Invalid input(s): POCBNP   CBG:  Recent Labs Lab 09/08/15 0419 09/08/15 1252 09/08/15 1638 09/08/15 1959 09/09/15 0003  GLUCAP 135* 126* 149* 171* 109*       Studies: Dg Chest Port 1 View  09/08/2015  CLINICAL DATA:  80 year old male with history of respiratory failure. History of hypertension, COPD and atrial fibrillation. Former smoker. EXAM: PORTABLE CHEST 1 VIEW COMPARISON:  Chest x-ray 09/06/2015. FINDINGS: There is a right-sided internal jugular central venous catheter with tip terminating in the superior cavoatrial junction. Patient is in a very lordotic position. There is cephalization of the pulmonary vasculature and slight indistinctness of the interstitial markings suggestive of mild pulmonary edema. No pleural effusions. Moderate cardiomegaly. Upper mediastinal contours are within normal limits. Status post median sternotomy for CABG, including LIMA. IMPRESSION: 1. The appearance the chest suggests mild congestive heart failure, as above. 2. Atherosclerosis. Electronically Signed   By: [name] M.D.   On: 09/08/2015 07:57      Lab Results  Component Value Date   HGBA1C 6.2* 01/17/2015   HGBA1C SEE COMMENT 09/01/2011   Lab Results  Component Value Date   LDLCALC 39 09/17/2014   CREATININE 3.10* 09/09/2015       Scheduled Meds: . feeding supplement (ENSURE ENLIVE)  237 mL Oral BID BM  . magnesium sulfate 1 - 4 g bolus IVPB  2 g Intravenous Once  . OXcarbazepine  150 mg Oral BID  . pantoprazole (PROTONIX) IV  40 mg Intravenous Q24H  . potassium chloride  60 mEq Oral TID   Continuous Infusions:   Principal Problem:   Acute encephalopathy Active Problems:   Seizures (HCC)   CHF (congestive heart failure) (HCC)   Atrial fibrillation  (HCC)   Hypertension   COPD (chronic obstructive pulmonary disease) (HCC)   Acute on chronic renal failure (HCC)   Normocytic anemia   Opioid dependence ([place])   Pulmonary arterial hypertension ([place])   Status post peripherally inserted central catheter (PICC) central line placement    Time spent: 45 minutes   [facility] Pager [phone]. If 7PM-7AM, please contact night-coverage at [email or web address], password [facility] 09/09/2015, 1:16 PM  LOS: 6 days

## 2015-09-09 NOTE — Progress Notes (Signed)
eLink Physician-Brief Progress Note Patient Name: CAIDYN DELGROSSO DOB: 06-10-1935 MRN: [id number]   Date of Service  09/09/2015  HPI/Events of Note  K 2.5, Cr 3.54  eICU Interventions  Order 4 runs of K repletion. Recheck BMP later today     Intervention Category Intermediate Interventions: Electrolyte abnormality - evaluation and management  Maryiah Olvey 09/09/2015, 3:48 AM

## 2015-09-09 NOTE — Telephone Encounter (Signed)
LMOVM for pt to return call 

## 2015-09-09 NOTE — Progress Notes (Signed)
Nutrition Follow-up  DOCUMENTATION CODES:   Not applicable  INTERVENTION:   Ensure Enlive po BID, each supplement provides 350 kcal and 20 grams of protein  NUTRITION DIAGNOSIS:   Inadequate oral intake now related to poor appetite as evidenced by meal completion < 50%, ongoing  GOAL:   Patient will meet greater than or equal to 90% of their needs, unmet  MONITOR:   PO intake, Supplement acceptance, Labs, Weight trends, I & O's  ASSESSMENT:   Pt with a past medical history of A. Fib on Xarelto, Chronic Diastolic Heart Failure, Pulmonary HTN, COPD Unknown Stage, CKD Stage III, possible history of seizures presents to the ED after being found unresponsive at home.  Pt extubated 4/13. TF (Vital HP formula) discontinued 4/14. Transferred from [facility] 4/17.  Pt in discomfort upon RD visit; mildly confused. S/p bedside swallow evaluation 4/14. PO intake poor at 25% per flowsheet records. Would benefit form oral nutrition supplements.  RD to order.  Diet Order:  Diet full liquid Room service appropriate?: Yes; Fluid consistency:: Thin  Skin:  Wound (see comment) (Patchy areas of partial thickness stasis ulcers to bilat anterior calves)  Last BM:  4/17  Height:   Ht Readings from Last 1 Encounters:  09/03/15 5\' 8"  (1.727 m)    Weight:   Wt Readings from Last 1 Encounters:  09/09/15 195 lb 1.7 oz (88.5 kg)    Ideal Body Weight:  70 kg  BMI:  Body mass index is 29.67 kg/(m^2).  Estimated Nutritional Needs:   Kcal:  1800-2000  Protein:  90-100 gm  Fluid:  1.8-2.0 L  EDUCATION NEEDS:   No education needs identified at this time  Arthur Holms, RD, LDN Pager #: [phone] After-Hours Pager #: [phone]

## 2015-09-09 NOTE — Care Management Important Message (Signed)
Important Message  Patient Details  Name: Vincent Mason MRN: [id number] Date of Birth: March 10, 1936   Medicare Important Message Given:  Yes    Elisea Khader P Juliocesar Blasius 09/09/2015, 1:03 PM

## 2015-09-09 NOTE — NC FL2 (Signed)
[place] LEVEL OF CARE SCREENING TOOL     IDENTIFICATION  Patient Name: Vincent Mason Birthdate: 1935/11/11 Sex: male Admission Date (Current Location): 09/03/2015  [facility] and [place] Number:  Engineering geologist and Address:  [facility] [place], [place] [address] [place], [place] [address]      Provider Number: [id number]  Attending Physician Name and Address:  Reyne Dumas, MD  Relative Name and Phone Number:       Current Level of Care: Hospital Recommended Level of Care: [place] Prior Approval Number:    Date Approved/Denied:   PASRR Number: [id number]  Discharge Plan: SNF    Current Diagnoses: Patient Active Problem List   Diagnosis Date Noted  . Abdominal pain   . Status post peripherally inserted central catheter (PICC) central line placement   . Acute on chronic renal failure ([place]) 09/03/2015  . Normocytic anemia 09/03/2015  . Opioid dependence ([place]) 09/03/2015  . Pulmonary arterial hypertension ([place]) 09/03/2015  . Acute encephalopathy 09/03/2015  . Open leg wound 09/02/2015  . Lymphedema 08/26/2015  . Hypokalemia 08/26/2015  . Hyperuricemia 07/09/2015  . Coronary artery disease 01/22/2015  . Chronic kidney disease (CKD), stage III (moderate) 01/22/2015  . Chronic constipation 01/22/2015  . COPD (chronic obstructive pulmonary disease) ([place]) 01/22/2015  . ED (erectile dysfunction) of organic origin 01/22/2015  . Lymphedema of both lower extremities 01/22/2015  . Fatigue 01/22/2015  . Blood in the urine 01/22/2015  . Calculus of kidney 01/22/2015  . Memory loss 01/22/2015  . Hypertensive pulmonary vascular disease ([place]) 01/22/2015  . Displacement of cervical intervertebral disc without myelopathy 01/22/2015  . Seizures ([place]) 01/17/2015  . Obstructive sleep apnea 01/17/2015  . CHF (congestive heart failure) ([place]) 01/17/2015  . Atrial fibrillation ([place]) 01/17/2015  . Hypertension 01/17/2015  .  Hyperlipidemia 01/17/2015  . Leg swelling 01/17/2015  . Degenerative disc disease, lumbar 01/17/2015  . Back pain 11/12/2014  . TI (tricuspid incompetence) 08/02/2014    Orientation RESPIRATION BLADDER Height & Weight     Self, Time, Place  Normal Indwelling catheter Weight: 195 lb 1.7 oz (88.5 kg) Height:  5\' 8"  (172.7 cm)  BEHAVIORAL SYMPTOMS/MOOD NEUROLOGICAL BOWEL NUTRITION STATUS      Incontinent Diet (full liquid)  AMBULATORY STATUS COMMUNICATION OF NEEDS Skin   Extensive Assist Verbally Normal                       Personal Care Assistance Level of Assistance  Bathing, Dressing Bathing Assistance: Maximum assistance   Dressing Assistance: Maximum assistance     Functional Limitations Info             SPECIAL CARE FACTORS FREQUENCY  PT (By licensed PT), OT (By licensed OT)     PT Frequency: 5/wk OT Frequency: 5/wk            Contractures      Additional Factors Info  Code Status, Allergies Code Status Info: FULL Allergies Info: Decongestant            Current Medications (09/09/2015):  This is the current hospital active medication list Current Facility-Administered Medications  Medication Dose Route Frequency Provider Last Rate Last Dose  . 0.9 % NaCl with KCl 20 mEq/ L  infusion   Intravenous Continuous Reyne Dumas, MD 75 mL/hr at 09/09/15 1452    . diazepam (VALIUM) tablet 2 mg  2 mg Oral Q12H PRN Reyne Dumas, MD   2 mg at 09/09/15  1450  . feeding supplement (ENSURE ENLIVE) (ENSURE ENLIVE) liquid 237 mL  237 mL Oral BID BM Reyne Dumas, MD   237 mL at 09/09/15 1456  . gabapentin (NEURONTIN) capsule 100 mg  100 mg Oral BID Reyne Dumas, MD   100 mg at 09/09/15 1430  . heparin ADULT infusion 100 units/mL (25000 units/250 mL)  1,150 Units/hr Intravenous Continuous Tyrone Apple, RPH 11.5 mL/hr at 09/09/15 1451 1,150 Units/hr at 09/09/15 1451  . OXcarbazepine (TRILEPTAL) tablet 150 mg  150 mg Oral BID Maryellen Pile, MD   150 mg at 09/09/15 1000   . pantoprazole (PROTONIX) injection 40 mg  40 mg Intravenous Q24H Jake Church Masters, RPH   40 mg at 09/09/15 1000  . polyethylene glycol (MIRALAX / GLYCOLAX) packet 17 g  17 g Oral Daily PRN Maryellen Pile, MD   17 g at 09/09/15 0959  . potassium chloride SA (K-DUR,KLOR-CON) CR tablet 60 mEq  60 mEq Oral BID Reyne Dumas, MD   60 mEq at 09/09/15 1451     Discharge Medications: Please see discharge summary for a list of discharge medications.  Relevant Imaging Results:  Relevant Lab Results:   Additional Information SS#: [id number]  Cranford Mon, [place]

## 2015-09-09 NOTE — Telephone Encounter (Signed)
Patient's wife was notified.  

## 2015-09-09 NOTE — Progress Notes (Signed)
Dietary came to nurses station to tell staff that pt was trying to hand her a handful of feces. Went to room and pt had stool in his hand bathed and bed change done reminded pt to call for assistance when he needs to go to the bathroom. Pt still knows his name and that he is on [facility].

## 2015-09-09 NOTE — Clinical Social Work Note (Signed)
Clinical Social Work Assessment  Patient Details  Name: Vincent Mason MRN: [id number] Date of Birth: 03-07-1936  Date of referral:  09/09/15               Reason for consult:  Facility Placement                Permission sought to share information with:  Family Supports Permission granted to share information::     Name::     Financial planner[place] [facility]  Relationship::  wife  Contact Information:     Housing/Transportation Living arrangements for the past 2 months:  Single Family Home Source of Information:  Spouse Patient Interpreter Needed:  None Criminal Activity/Legal Involvement Pertinent to Current Situation/Hospitalization:  No - Comment as needed Significant Relationships:  Spouse Lives with:  Spouse Do you feel safe going back to the place where you live?  No Need for family participation in patient care:  Yes (Comment) (pt needing help with ADLs at home)  Care giving concerns:  Pt lives at home with wife who has been doing a great deal for him with all his recent falls- currently wife does not feel as if she can handle his current needs (2+ assist)    Facilities manager / plan:  CSW spoke with pt wife concerning plan for DC (per RN pt is not oriented to situation and does not answer most questions appropriately).  CSW informed pt wife of recommendation for CIR vs SNF when pt is medically stable to DC to alternate level- CSW explained differences between the programs.    Pt wife states pt has not been to rehab before but she knows people who have and think he would benefit from this.  Employment status:  Retired Forensic scientist:  Medicare PT Recommendations:  Inpatient [place] / Referral to community resources:  [place]  Patient/Family's Response to care:  Pt wife is agreeable to CIR or SNF while pt is recovering.  Very realistic that pt would not be safe to return home at this time- prefers  placement near [place] if CIR is not an option.  Patient/Family's Understanding of and Emotional Response to Diagnosis, Current Treatment, and Prognosis:  Pt wife is very knowledgeable and realistic about pt needs- hopeful that pt will recover in hospital and be able to return home sooner than anticipated.  Emotional Assessment Appearance:  Appears stated age Attitude/Demeanor/Rapport:  Unable to Assess Affect (typically observed):  Unable to Assess Orientation:  Oriented to Self, Oriented to Place, Oriented to  Time Alcohol / Substance use:  Not Applicable Psych involvement (Current and /or in the community):  No (Comment)  Discharge Needs  Concerns to be addressed:  Care Coordination Readmission within the last 30 days:  No Current discharge risk:  Physical Impairment Barriers to Discharge:  Continued Medical Work up   [employer], LCSW 09/09/2015, 5:22 PM

## 2015-09-09 NOTE — Progress Notes (Signed)
CRITICAL VALUE ALERT  Critical value received:  K+ 2.5  Date of notification:  09/09/15  Time of notification:  [id number]  Critical value read back: yes  Nurse who received alert:  Candiss Norse RN  MD notified (1st page):  PCCM - Gretchen RN  Time of first page:  [phone]  MD notified (2nd page):  Time of second page:  Responding MD:  See [facility]  Time MD responded:

## 2015-09-10 ENCOUNTER — Inpatient Hospital Stay (HOSPITAL_COMMUNITY): Payer: Medicare Other

## 2015-09-10 LAB — COMPREHENSIVE METABOLIC PANEL
ALT: 35 U/L (ref 17–63)
AST: 53 U/L — ABNORMAL HIGH (ref 15–41)
Albumin: 2.7 g/dL — ABNORMAL LOW (ref 3.5–5.0)
Alkaline Phosphatase: 62 U/L (ref 38–126)
Anion gap: 12 (ref 5–15)
BUN: 75 mg/dL — ABNORMAL HIGH (ref 6–20)
CO2: 25 mmol/L (ref 22–32)
Calcium: 8.2 mg/dL — ABNORMAL LOW (ref 8.9–10.3)
Chloride: 106 mmol/L (ref 101–111)
Creatinine, Ser: 2.39 mg/dL — ABNORMAL HIGH (ref 0.61–1.24)
GFR calc Af Amer: 28 mL/min — ABNORMAL LOW (ref >60)
GFR calc non Af Amer: 24 mL/min — ABNORMAL LOW (ref >60)
Glucose, Bld: 143 mg/dL — ABNORMAL HIGH (ref 65–99)
Potassium: 2.9 mmol/L — ABNORMAL LOW (ref 3.5–5.1)
Sodium: 143 mmol/L (ref 135–145)
Total Bilirubin: 1.3 mg/dL — ABNORMAL HIGH (ref 0.3–1.2)
Total Protein: 5.7 g/dL — ABNORMAL LOW (ref 6.5–8.1)

## 2015-09-10 LAB — CBC
HCT: 25.4 % — ABNORMAL LOW (ref 39.0–52.0)
HCT: 24.1 % — ABNORMAL LOW (ref 39.0–52.0)
Hemoglobin: 7.7 g/dL — ABNORMAL LOW (ref 13.0–17.0)
Hemoglobin: 7.6 g/dL — ABNORMAL LOW (ref 13.0–17.0)
MCH: 27.3 pg (ref 26.0–34.0)
MCH: 28.4 pg (ref 26.0–34.0)
MCHC: 30.3 g/dL (ref 30.0–36.0)
MCHC: 31.5 g/dL (ref 30.0–36.0)
MCV: 90.1 fL (ref 78.0–100.0)
MCV: 89.9 fL (ref 78.0–100.0)
Platelets: 166 10*3/uL (ref 150–400)
Platelets: 153 10*3/uL (ref 150–400)
RBC: 2.82 MIL/uL — ABNORMAL LOW (ref 4.22–5.81)
RBC: 2.68 MIL/uL — ABNORMAL LOW (ref 4.22–5.81)
RDW: 19 % — ABNORMAL HIGH (ref 11.5–15.5)
RDW: 19.2 % — ABNORMAL HIGH (ref 11.5–15.5)
WBC: 15.8 10*3/uL — ABNORMAL HIGH (ref 4.0–10.5)
WBC: 19.7 10*3/uL — ABNORMAL HIGH (ref 4.0–10.5)

## 2015-09-10 LAB — HEPARIN LEVEL (UNFRACTIONATED)
Heparin Unfractionated: 0.25 IU/mL — ABNORMAL LOW (ref 0.30–0.70)
Heparin Unfractionated: 1.04 IU/mL — ABNORMAL HIGH (ref 0.30–0.70)
Heparin Unfractionated: 1.96 IU/mL — ABNORMAL HIGH (ref 0.30–0.70)

## 2015-09-10 LAB — OCCULT BLOOD X 1 CARD TO LAB, STOOL: Fecal Occult Bld: NEGATIVE

## 2015-09-10 MED ORDER — COLCHICINE 0.6 MG PO TABS
0.6000 mg | ORAL_TABLET | Freq: Every day | ORAL | Status: DC | PRN
Start: 2015-09-10 — End: 2015-09-17
  Administered 2015-09-10: 0.6 mg via ORAL
  Filled 2015-09-10 (×2): qty 1

## 2015-09-10 MED ORDER — DIGOXIN 125 MCG PO TABS
0.0625 mg | ORAL_TABLET | Freq: Every day | ORAL | Status: DC
  Administered 2015-09-11 – 2015-09-17 (×7): 0.0625 mg via ORAL
  Filled 2015-09-10 (×7): qty 1

## 2015-09-10 MED ORDER — BARIUM SULFATE 2.1 % PO SUSP
900.0000 mL | Freq: Once | ORAL | Status: AC
  Administered 2015-09-10: 900 mL via ORAL

## 2015-09-10 MED ORDER — PANTOPRAZOLE SODIUM 40 MG PO TBEC
40.0000 mg | DELAYED_RELEASE_TABLET | Freq: Two times a day (BID) | ORAL | Status: DC
  Administered 2015-09-10 – 2015-09-11 (×2): 40 mg via ORAL
  Filled 2015-09-10 (×2): qty 1

## 2015-09-10 MED ORDER — FEBUXOSTAT 40 MG PO TABS
80.0000 mg | ORAL_TABLET | Freq: Every day | ORAL | Status: DC
  Administered 2015-09-10 – 2015-09-17 (×8): 80 mg via ORAL
  Filled 2015-09-10 (×8): qty 2

## 2015-09-10 MED ORDER — OXYCODONE HCL 5 MG PO TABS
5.0000 mg | ORAL_TABLET | ORAL | Status: DC | PRN
  Administered 2015-09-10 – 2015-09-13 (×12): 5 mg via ORAL
  Filled 2015-09-10 (×12): qty 1

## 2015-09-10 MED ORDER — DIAZEPAM 2 MG PO TABS
2.0000 mg | ORAL_TABLET | Freq: Three times a day (TID) | ORAL | Status: DC
Start: 2015-09-10 — End: 2015-09-17
  Administered 2015-09-10 – 2015-09-17 (×18): 2 mg via ORAL
  Filled 2015-09-10 (×20): qty 1

## 2015-09-10 MED ORDER — SODIUM CHLORIDE 0.9 % IV SOLN
INTRAVENOUS | Status: AC
  Administered 2015-09-10: 15:00:00 via INTRAVENOUS
  Filled 2015-09-10: qty 1000

## 2015-09-10 MED ORDER — BARIUM SULFATE 2.1 % PO SUSP
ORAL | Status: AC
  Filled 2015-09-10: qty 2

## 2015-09-10 MED ORDER — ROPINIROLE HCL 1 MG PO TABS
2.0000 mg | ORAL_TABLET | Freq: Every day | ORAL | Status: DC
  Administered 2015-09-11 – 2015-09-16 (×6): 2 mg via ORAL
  Filled 2015-09-10 (×7): qty 2

## 2015-09-10 NOTE — Consult Note (Signed)
   [facility] CM Inpatient Consult   09/10/2015  Vincent Mason [birth year] [id number] Patient screened for potential [place] Management services  As a benefit of his Medicare.  Patient is eligible for [place]. Chart review reveals patient's discharge plan is to a skilled nursing facility.  Patient remains confused per document.  No current community care management needs at this time is noted. [facility] Care Management services not appropriate at this time. If patient's post hospital needs change please place a [facility] Care Management consult. For questions please contact:   Natividad Brood, RN BSN [facility] Liaison  [phone] business mobile phone Toll free office [phone]

## 2015-09-10 NOTE — Progress Notes (Signed)
ANTICOAGULATION CONSULT NOTE - Follow Up Consult  Pharmacy Consult for Heparin Indication: atrial fibrillation  Allergies  Allergen Reactions  . Decongestant  [Oxymetazoline]     Patient Measurements: Height: 5\' 8"  (172.7 cm) Weight: 192 lb 3.9 oz (87.2 kg) IBW/kg (Calculated) : 68.4 Heparin Dosing Weight:   Vital Signs: Temp: 98.1 F (36.7 C) (04/18 0600) BP: 112/77 mmHg (04/18 0600) Pulse Rate: 86 (04/18 0600)  Labs:  Recent Labs  09/07/15 1954  09/08/15 2111 09/09/15 0214 09/09/15 1225 09/09/15 2155 09/10/15 0600 09/10/15 0700  HGB  --   < > 7.2* 7.5*  --   --   --  7.7*  HCT  --   < > 23.4* 23.8*  --   --   --  25.4*  PLT  --   < > 182 192  --   --   --  166  APTT 82*  --   --   --   --   --   --   --   HEPARINUNFRC 0.55  < >  --  <0.10*  --  0.23* 0.25*  --   CREATININE  --   < >  --  3.54* 3.10*  --   --  2.39*  < > = values in this interval not displayed.  Estimated Creatinine Clearance: 27.3 mL/min (by C-G formula based on Cr of 2.39).   Medications:  Scheduled:  . feeding supplement (ENSURE ENLIVE)  237 mL Oral BID BM  . gabapentin  100 mg Oral BID  . OXcarbazepine  150 mg Oral BID  . pantoprazole (PROTONIX) IV  40 mg Intravenous Q24H  . potassium chloride  60 mEq Oral BID    Assessment: 80yo male on heparin bridge while Xarelto on hold.  Pt with extensive bruising due to fall but no bleeding.  Noted FOB (+) x 2 on 4/17, per d/w RN no overt bleeding & to check FOB again today when able.  Hg has been low/stable, but pltc trending down overall.  Pltc stable on 4/17, but heparin off on 4/16.  Heparin level remain low following adjustment in rate last PM.  Goal of Therapy:  Heparin level 0.3-0.7 units/ml Monitor platelets by anticoagulation protocol: Yes   Plan:  Increase heparin to 1450 units/hr Repeat heparin level and CBC in 8hr Consider checking HIT panel if pltc drops again Watch for s/s of bleeding Continue daily HL, CBC  Gracy Bruins,  PharmD Clinical Pharmacist [facility]

## 2015-09-10 NOTE — Progress Notes (Signed)
NURSING PROGRESS NOTE  Vincent Mason [id number] Transfer Data: 09/10/2015 5:44 PM Attending Provider: Reyne Dumas, MD JYN:[id number] Fisher, MD Code Status: Full   Vincent Mason is a 80 y.o. male patient transferred from [place]  -No acute distress noted.  -No complaints of shortness of breath.  -No complaints of chest pain.   Cardiac Monitoring: Box # 18 in place. Cardiac monitor yields: A fib/flutter  Last Documented Vital Signs: Blood pressure 128/56, pulse 78, temperature 97.9 F (36.6 C), temperature source Oral, resp. rate 16, height 5' 8"  (1.727 m), weight 87.2 kg (192 lb 3.9 oz), SpO2 99 %.  IV Fluids:  IV in place, occlusive dsg intact without redness, IV R IJ and L wrist, fluids NS60K  Allergies:  Decongestant   Past Medical History:   has a past medical history of Hypertension; Hyperlipidemia; COPD (chronic obstructive pulmonary disease) ([place]); Kidney disease, chronic, stage III (moderate, EGFR 30-59 ml/min); and Atrial fibrillation and flutter ([place]).  Past Surgical History:   has past surgical history that includes Cardiac surgery; Hyperplastic colon polyp (02/2002); Coronary artery bypass graft (10/1997); and history of cervical discectomy.  Social History:   reports that he has quit smoking. He does not have any smokeless tobacco history on file. He reports that he does not drink alcohol or use illicit drugs.  Skin: intact  Patient/Family orientated to room. Information packet given to patient/family. Admission inpatient armband information verified with patient/family to include name and date of birth and placed on patient arm. Side rails up x 2, fall assessment and education completed with patient/family. Patient/family able to verbalize understanding of risk associated with falls and verbalized understanding to call for assistance before getting out of bed. Call light within reach. Patient/family able to voice and demonstrate understanding of unit orientation instructions.

## 2015-09-10 NOTE — Progress Notes (Addendum)
[facility] PROGRESS NOTE  Vincent Mason [id number] DOB: 12/08/1935 DOA: 09/03/2015   PCP: Lelon Huh, MD  Length of stay: 7   Assessment/Plan: Principal Problem:   Acute encephalopathy Active Problems:   Seizures ([place])   CHF (congestive heart failure) (HCC)   Atrial fibrillation ([place])   Hypertension   COPD (chronic obstructive pulmonary disease) ([place])   Acute on chronic renal failure (HCC)   Normocytic anemia   Opioid dependence ([place])   Pulmonary arterial hypertension ([place])   Status post peripherally inserted central catheter (PICC) central line placement   Abdominal pain   HISTORY OF PRESENT ILLNESS:  Mr. Sohal is a 80 year old male with a past medical history of A. Fib on Xarelto, Chronic Diastolic Heart Failure, Pulmonary HTN, COPD Unknown Stage, CKD Stage III, possible history of seizures presents to the ED after being found unresponsive at home. Wife reports that he was his normal self yesterday, woke up around 2 AM to use the bathroom and fell. Wife found him unresponsive and called EMS. EMS reports he had unequal pupils with left larger than right. He was intubated on arrival to the ED due to acute respiratory failure. Wife reports several episodes of staring spells and several episodes of unresponsiveness that have lasted 5-10 minutes the past several months. He was admitted 12/2014 with similar presentation and thought to be having seizures at that time and started on Trileptal with neurology follow up.   Assessment and plan Acute Hypoxic Respiratory Failure - dense bilateral infiltrates H/O COPD Questionable H/O Pulmonary HTN ASP PNA? Doing well post extubation on RA now ,Extubated on 09/05/15 Lasix Hold 4/15-4/16 may now be dry, consider restarting based on Cr levels completed treatment for pneumonia with levaquin and vanc      Chronic Diastolic HF (per PCP note EF normal in 2015) Septic shock H/O Atrial fibrillation - chronic anticoagulation  w/ Xarelto H/O CAD s/p CABG H/O HTN H/O Hyperlipidemia Off pressors,dopamine has been weaned off Stopped diuresis 4/15 Echo EF 60-65% and moderate to severe pulmonary HTN. No role stress steroids, cortisol adequate. continue his heparin ,while waiting for improvement of his renal fn , then restart xarelto    RENAL improving ,likely ATN   Labs (Brief) 5.23>2.39   Urine output 2850 in 24 hours    Acute on Chronic Renal Failure Stage III AG acidosis KVO IVF D/C Lasix Replace electrolytes as indicated. BMET  Consider restarting lasix prior to dc     Questionable H/O GERD - On Zantac. Transaminitis - Mild.  R/o gastric stress on xarelto SLP eval . Thin liquids  PPI. Having several BMs 4/16, no BM today    HEMATOLOGIC  Recent Labs (last 2 labs)      Recent Labs  09/07/15 0430 09/08/15 0330  HGB 7.5* 6.9*       Extensive Bruising - Likely secondary to Xarelto & falls. Anemia - Likely secondary to slow chronic blood loss. Coagulopathy - Secondary to Xarelto & acute on chronic renal failure. Leukocytosis - Sepsis versus stress response. CBC - Hgb 6.9 -s/p 1 unit PRBC Holding Xarelto Continue heparin drip. Consider stopping if Hgb continues to trend down  needs outpt GI workup   Septic shock, PNA asp? atypical Possible UTI - Few bacteria seen on UA Lower ext with PVD D/C ABX, follow clinically      Acute Encephalopathy Recurrent Falls H/O Possible Seizures Chronic Narcotic & Benzodiazepine use D/C MRI EEG neg focus RASS goal: 0  Continue home Trileptal        Restless legs Started patient on Neurontin    DVT prophylaxsis heparin gtt  Code Status:      Code Status Orders    Full code     Start     Ordered     Family Communication: Discussed in detail with the patient, all imaging results, lab results explained to the patient   Disposition Plan: Anticipate transfer to telemetry.     Consultants:  PCCM   SIGNIFICANT  EVENTS: 4/11 - Admit 4/11 shock, neo rising 4/12- improved shock 4/15 WEAN PRESSORS  LINES/TUBES: 09/03/2015 PIV Left Hand, Left Wrist 09/03/2015 Foley 09/03/2015 NG/OG Tube 09/03/2015 ETT>>>4/13 4/11 rt IJ>>> 4/11 rt aline>>>  Antibiotics: Levaquin 4/11-4/16 Vancomycin 4/11-4/14        HPI/Subjective: Telemetry showed atrial fibrillation with PVCs and bradycardia no pauses  Objective: Filed Vitals:   09/10/15 0345 09/10/15 0400 09/10/15 0500 09/10/15 0600  BP:  110/71 112/52 112/77  Pulse: 73 72 74 86  Temp: 97.7 F (36.5 C) 97.7 F (36.5 C) 98.1 F (36.7 C) 98.1 F (36.7 C)  TempSrc:      Resp: 23 17 26 28   Height:      Weight:   87.2 kg (192 lb 3.9 oz)   SpO2: 100% 100% 98% 97%    Intake/Output Summary (Last 24 hours) at 09/10/15 1236 Last data filed at 09/10/15 0900  Gross per 24 hour  Intake 2419.46 ml  Output   2200 ml  Net 219.46 ml    Exam:  Examination: General: alert and interactive. Neuro: AAOx3, able to keep conversation and do simple calculations, moves all ext purpsoefulyl. No focal deficits HEENT: line clean, no sig hematoma PULM: decreased in bases CV: s1 s2 irR GI: tender, bs wnl, non-distended. Ecchymosis over LL abdomen and across lower back Extremities: mild edema, lower ext ecchymosis. Changed pvd     Data Reviewed: I have personally reviewed following labs and imaging studies  Micro Results Recent Results (from the past 240 hour(s))  Culture, Urine     Status: Abnormal   Collection Time: 09/03/15  5:10 AM  Result Value Ref Range Status   Specimen Description URINE, CATHETERIZED  Final   Special Requests Normal  Final   Culture 8,000 COLONIES/mL INSIGNIFICANT GROWTH (A)  Final   Report Status 09/04/2015 FINAL  Final  Culture, blood (routine x 2)     Status: None   Collection Time: 09/03/15  7:00 AM  Result Value Ref Range Status   Specimen Description BLOOD RIGHT ARM  Final   Special Requests   Final     BOTTLES DRAWN AEROBIC AND ANAEROBIC 10CC IN AEB 5CC IN ANA   Culture NO GROWTH 5 DAYS  Final   Report Status 09/08/2015 FINAL  Final  MRSA PCR Screening     Status: None   Collection Time: 09/03/15  7:11 AM  Result Value Ref Range Status   MRSA by PCR NEGATIVE NEGATIVE Final    Comment:        The GeneXpert MRSA Assay (FDA approved for NASAL specimens only), is one component of a comprehensive MRSA colonization surveillance program. It is not intended to diagnose MRSA infection nor to guide or monitor treatment for MRSA infections.   Culture, blood (routine x 2)     Status: None   Collection Time: 09/03/15  7:15 AM  Result Value Ref Range Status   Specimen Description BLOOD RIGHT HAND  Final   Special Requests  IN PEDIATRIC BOTTLE  3CC  Final   Culture NO GROWTH 5 DAYS  Final   Report Status 09/08/2015 FINAL  Final  Culture, respiratory (NON-Expectorated)     Status: None   Collection Time: 09/03/15  7:40 AM  Result Value Ref Range Status   Specimen Description TRACHEAL ASPIRATE  Final   Special Requests Normal  Final   Gram Stain   Final    FEW WBC PRESENT,BOTH PMN AND MONONUCLEAR MODERATE GRAM POSITIVE COCCI IN CHAINS Performed at [facility]    Culture   Final    NORMAL OROPHARYNGEAL FLORA Performed at [facility]    Report Status 09/05/2015 FINAL  Final  Respiratory virus panel     Status: None   Collection Time: 09/03/15  6:21 PM  Result Value Ref Range Status   Source - RVPAN NASAL SWAB  Corrected   Respiratory Syncytial Virus A Negative Negative Final   Respiratory Syncytial Virus B Negative Negative Final   Influenza A Negative Negative Final   Influenza B Negative Negative Final   Parainfluenza 1 Negative Negative Final   Parainfluenza 2 Negative Negative Final   Parainfluenza 3 Negative Negative Final   Metapneumovirus Negative Negative Final   Rhinovirus Negative Negative Final   Adenovirus Negative Negative Final    Comment:  (NOTE) Performed At: [facility] [address] [place], [place] [id number] Lindon Romp MD [id number]     Radiology Reports Ct Abdomen Pelvis Wo Contrast  09/03/2015  CLINICAL DATA:  Unresponsive. EXAM: CT ABDOMEN AND PELVIS WITHOUT CONTRAST TECHNIQUE: Multidetector CT imaging of the abdomen and pelvis was performed following the standard protocol without IV contrast. COMPARISON:  None. FINDINGS: Dense consolidation in both lung bases with air bronchograms. This could represent pneumonia, aspiration, hemorrhage. There are unremarkable unenhanced appearances of the liver, gallbladder, bile ducts, pancreas, spleen, adrenals and kidneys. Renal vascular calcifications are present. There are extensive atherosclerotic calcifications of the normal caliber aorta. There is a nasogastric tube extending into the stomach. Bowel is otherwise unremarkable. No bowel obstruction. No extraluminal air. No acute inflammatory changes are evident in the abdomen or pelvis. There is no ascites. No significant musculoskeletal lesion is evident. IMPRESSION: 1. No acute findings are evident in the abdomen or pelvis. 2. Dense consolidation in both lung bases Electronically Signed   By: Andreas Newport M.D.   On: 09/03/2015 06:05   Ct Head Wo Contrast  09/03/2015  CLINICAL DATA:  Unresponsive. EXAM: CT HEAD WITHOUT CONTRAST CT CERVICAL SPINE WITHOUT CONTRAST TECHNIQUE: Multidetector CT imaging of the head and cervical spine was performed following the standard protocol without intravenous contrast. Multiplanar CT image reconstructions of the cervical spine were also generated. COMPARISON:  01/17/2015 FINDINGS: CT HEAD FINDINGS There is no intracranial hemorrhage, mass or evidence of acute infarction. There is no extra-axial fluid collection. There is moderate generalized atrophy. Possible remote lacunar infarctions in the left pons and left basal ganglia. No bony abnormality. CT CERVICAL SPINE FINDINGS The  vertebral column, pedicles and facet articulations are intact. There is no evidence of acute fracture. No acute soft tissue abnormalities are evident. Mild motion degradation of the images.  Endotracheal tube noted. IMPRESSION: 1. No acute intracranial findings. Mild generalized atrophy and remote lacunar infarctions, unchanged. 2. Negative for acute cervical spine fracture. Mildly motion degraded study. Critical Value/emergent results were called by telephone at the time of interpretation on 09/03/2015 at 3:39 am to Dr. Wallie Char, who verbally acknowledged these results. Electronically Signed   By:  Andreas Newport M.D.   On: 09/03/2015 03:40   Ct Cervical Spine Wo Contrast  09/03/2015  CLINICAL DATA:  Unresponsive. EXAM: CT HEAD WITHOUT CONTRAST CT CERVICAL SPINE WITHOUT CONTRAST TECHNIQUE: Multidetector CT imaging of the head and cervical spine was performed following the standard protocol without intravenous contrast. Multiplanar CT image reconstructions of the cervical spine were also generated. COMPARISON:  01/17/2015 FINDINGS: CT HEAD FINDINGS There is no intracranial hemorrhage, mass or evidence of acute infarction. There is no extra-axial fluid collection. There is moderate generalized atrophy. Possible remote lacunar infarctions in the left pons and left basal ganglia. No bony abnormality. CT CERVICAL SPINE FINDINGS The vertebral column, pedicles and facet articulations are intact. There is no evidence of acute fracture. No acute soft tissue abnormalities are evident. Mild motion degradation of the images.  Endotracheal tube noted. IMPRESSION: 1. No acute intracranial findings. Mild generalized atrophy and remote lacunar infarctions, unchanged. 2. Negative for acute cervical spine fracture. Mildly motion degraded study. Critical Value/emergent results were called by telephone at the time of interpretation on 09/03/2015 at 3:39 am to Dr. Wallie Char, who verbally acknowledged these results.  Electronically Signed   By: Andreas Newport M.D.   On: 09/03/2015 03:40   US Renal Port  09/04/2015  CLINICAL DATA:  Acute onset of renal insufficiency. EXAM: RENAL / URINARY TRACT ULTRASOUND COMPLETE COMPARISON:  CT of the abdomen and pelvis from 09/03/2015 FINDINGS: Right Kidney: Length: 11.8 cm. Slightly increased parenchymal echogenicity noted. No mass or hydronephrosis visualized. Left Kidney: Length: 13.0 cm. Slightly increased parenchymal echogenicity noted. No mass or hydronephrosis visualized. Bladder: Largely decompressed, with a Foley catheter in place. IMPRESSION: 1. No evidence of hydronephrosis. 2. Slightly increased renal parenchymal echogenicity could reflect mild medical renal disease. Electronically Signed   By: Garald Balding M.D.   On: 09/04/2015 02:55   Dg Chest Port 1 View  09/08/2015  CLINICAL DATA:  80 year old male with history of respiratory failure. History of hypertension, COPD and atrial fibrillation. Former smoker. EXAM: PORTABLE CHEST 1 VIEW COMPARISON:  Chest x-ray 09/06/2015. FINDINGS: There is a right-sided internal jugular central venous catheter with tip terminating in the superior cavoatrial junction. Patient is in a very lordotic position. There is cephalization of the pulmonary vasculature and slight indistinctness of the interstitial markings suggestive of mild pulmonary edema. No pleural effusions. Moderate cardiomegaly. Upper mediastinal contours are within normal limits. Status post median sternotomy for CABG, including LIMA. IMPRESSION: 1. The appearance the chest suggests mild congestive heart failure, as above. 2. Atherosclerosis. Electronically Signed   By: [name] M.D.   On: 09/08/2015 07:57   Dg Chest Port 1 View  09/06/2015  CLINICAL DATA:  Respiratory failure, recent extubation, patient is experiencing hemoptysis. History of CHF and COPD. EXAM: PORTABLE CHEST 1 VIEW COMPARISON:  Portable chest x-ray of September 05, 2015 FINDINGS: The lungs are  well-expanded. There is no focal infiltrate. The interstitial markings are coarse but improved in the right lower lobe. The cardiac silhouette remains enlarged. The pulmonary vascularity is prominent centrally. There is no significant pulmonary edema. No pleural effusion or pneumothorax is evident. The right internal jugular venous catheter tip projects over the midportion of the SVC. The patient has undergone previous median sternotomy. IMPRESSION: Improved aeration of both lungs. Persistent mild interstitial edema or pneumonia medially in the right lower lobe. Stable cardiomegaly and central pulmonary vascular congestion. Electronically Signed   By: David  Martinique M.D.   On: 09/06/2015 07:29   Dg Chest Port 1  View  09/05/2015  CLINICAL DATA:  CHF, COPD, respiratory failure, renal failure. EXAM: PORTABLE CHEST 1 VIEW COMPARISON:  Portable chest x-ray of September 04, 2015 FINDINGS: The cardiac silhouette remains enlarged. The lungs are well-expanded. There is hazy increased density in the right lower lobe more conspicuous today. There is no significant pleural effusion. The endotracheal tube tip lies approximately 6.3 cm above the carina. The esophagogastric tube tip projects below the inferior margin of the image. The right internal jugular venous catheter tip projects over the midportion of the SVC. IMPRESSION: Right lower lobe pneumonia. Mild cardiomegaly with mild central pulmonary vascular congestion but decreased pulmonary interstitial edema. The support tubes are in stable position. Electronically Signed   By: David  Martinique M.D.   On: 09/05/2015 07:56   Dg Chest Port 1 View  09/04/2015  CLINICAL DATA:  Respiratory failure, intubated EXAM: PORTABLE CHEST 1 VIEW COMPARISON:  09/03/2015 FINDINGS: Cardiomegaly again noted. Hazy bilateral lower lobe atelectasis or infiltrate right greater than left. No convincing pulmonary edema. Status post CABG. NG tube in place. Endotracheal tube in place with tip 4.7 cm  above the carina. Stable right IJ central line with tip in distal SVC. There is no pneumothorax. IMPRESSION: No convincing pulmonary edema. Status post CABG. Cardiomegaly again noted. Persistent bilateral lower lobe hazy atelectasis or infiltrate right greater than left. Stable support apparatus. Electronically Signed   By: Lahoma Crocker M.D.   On: 09/04/2015 11:40   Dg Chest Port 1 View  09/03/2015  CLINICAL DATA:  80 year old male status post PICC line placement. Initial encounter. EXAM: PORTABLE CHEST 1 VIEW COMPARISON:  CT Abdomen and Pelvis 0534 hours today. FINDINGS: Portable AP semi upright view at 1218 hours. Pacing pads overlie the chest. No PICC line is identified. There is a right IJ approach central line, tip at the level of the carina. Bilateral lung base consolidation re- demonstrated. There is cardiomegaly. Other mediastinal contours are within normal limits. Sequelae of CABG. No pneumothorax or pulmonary edema. No definite pleural effusion. IMPRESSION: 1. Right IJ central line placed, tip at the lower SVC level. No pneumothorax. 2. No PICC line identified. 3. Bilateral lower lobe consolidation compatible with bilateral pneumonia. Cardiomegaly. Electronically Signed   By: Genevie Ann M.D.   On: 09/03/2015 13:14   Dg Chest Port 1 View  09/03/2015  CLINICAL DATA:  Respiratory failure EXAM: PORTABLE CHEST 1 VIEW COMPARISON:  None. FINDINGS: The endotracheal tube is 5.2 cm above the carina. There is marked cardiomegaly, unchanged. There is extensive vascular and interstitial fullness which has worsened. There is worsening perihilar alveolar opacity, likely edema. No pneumothorax. No large effusions. IMPRESSION: Satisfactory ET tube position. Worsening changes of congestive heart failure with interstitial and alveolar edema. Electronically Signed   By: Andreas Newport M.D.   On: 09/03/2015 03:18   Dg Abd Portable 1v  09/06/2015  CLINICAL DATA:  Abdominal pain EXAM: PORTABLE ABDOMEN - 1 VIEW COMPARISON:   CT abdomen and pelvis September 03, 2015 FINDINGS: There is a single loop of borderline prominent small bowel in the upper abdomen just to the left of midline. No other bowel dilatation is noted. There is no air-fluid level. No free air. A rectal thermometer is in place. IMPRESSION: Single loop of slightly dilated small bowel. This finding could be indicative of early ileus or enteritis. Obstruction is not felt to be likely. No free air evident on this supine examination. Electronically Signed   By: Lowella Grip III M.D.   On: 09/06/2015 17:07  CBC  Recent Labs Lab 09/04/15 0453 09/05/15 0600  09/08/15 0330 09/08/15 1248 09/08/15 2111 09/09/15 0214 09/10/15 0700  WBC 22.3* 20.2*  < > 25.9* 23.1* 21.1* 20.2* 15.8*  HGB 7.7* 7.7*  < > 6.9* 7.3* 7.2* 7.5* 7.7*  HCT 24.8* 23.6*  < > 21.7* 23.0* 23.4* 23.8* 25.4*  PLT 351 322  < > 219 182 182 192 166  MCV 88.6 88.1  < > 88.2 87.1 88.0 87.5 90.1  MCH 27.5 28.7  < > 28.0 27.7 27.1 27.6 27.3  MCHC 31.0 32.6  < > 31.8 31.7 30.8 31.5 30.3  RDW 17.3* 17.5*  < > 17.9* 18.3* 18.8* 18.7* 19.0*  LYMPHSABS 0.4* 1.0  --   --   --   --   --   --   MONOABS 1.0 0.9  --   --   --   --   --   --   EOSABS 0.0 0.1  --   --   --   --   --   --   BASOSABS 0.0 0.0  --   --   --   --   --   --   < > = values in this interval not displayed.  Chemistries   Recent Labs Lab 09/03/15 1816  09/05/15 0600 09/06/15 0426  09/06/15 1810 09/07/15 0430 09/08/15 0330 09/09/15 0214 09/09/15 1225 09/10/15 0700  NA  --   < > 135 139  < >  --  141 141 141 143 143  K  --   < > 2.9* 3.2*  < >  --  2.8* 2.8* 2.5* 2.7* 2.9*  CL  --   < > 97* 95*  < >  --  93* 97* 100* 103 106  CO2  --   < > 21* 25  < >  --  29 29 29 27 25   GLUCOSE  --   < > 131* 117*  < >  --  129* 130* 108* 121* 143*  BUN  --   < > 147* 163*  < >  --  159* 144* 111* 97* 75*  CREATININE  --   < > 5.26* 5.23*  < >  --  5.22* 4.45* 3.54* 3.10* 2.39*  CALCIUM  --   < > 7.8* 8.2*  < >  --  8.4* 8.0*  8.0* 8.3* 8.2*  MG  --   < >  --  2.1  --  1.9 2.1 2.1  --  2.1  --   AST 70*  --  41  --   --   --   --   --  45*  --  53*  ALT 47  --  42  --   --   --   --   --  31  --  35  ALKPHOS 55  --  55  --   --   --   --   --  60  --  62  BILITOT 0.9  --  0.9  --   --   --   --   --  1.2  --  1.3*  < > = values in this interval not displayed. ------------------------------------------------------------------------------------------------------------------ estimated creatinine clearance is 27.3 mL/min (by C-G formula based on Cr of 2.39). ------------------------------------------------------------------------------------------------------------------ No results for input(s): HGBA1C in the last 72 hours. ------------------------------------------------------------------------------------------------------------------ No results for input(s): CHOL, HDL, LDLCALC, TRIG, CHOLHDL, LDLDIRECT in the last 72 hours. ------------------------------------------------------------------------------------------------------------------ No results for input(s):  TSH, T4TOTAL, T3FREE, THYROIDAB in the last 72 hours.  Invalid input(s): FREET3 ------------------------------------------------------------------------------------------------------------------ No results for input(s): VITAMINB12, FOLATE, FERRITIN, TIBC, IRON, RETICCTPCT in the last 72 hours.  Coagulation profile  Recent Labs Lab 09/04/15 0453  INR 2.05*    No results for input(s): DDIMER in the last 72 hours.  Cardiac Enzymes  Recent Labs Lab 09/03/15 1817  TROPONINI 0.07*   ------------------------------------------------------------------------------------------------------------------ Invalid input(s): POCBNP   CBG:  Recent Labs Lab 09/08/15 0419 09/08/15 1252 09/08/15 1638 09/08/15 1959 09/09/15 0003  GLUCAP 135* 126* 149* 171* 109*       Studies: No results found.    Lab Results  Component Value Date   HGBA1C 6.2*  01/17/2015   HGBA1C SEE COMMENT 09/01/2011   Lab Results  Component Value Date   LDLCALC 39 09/17/2014   CREATININE 2.39* 09/10/2015       Scheduled Meds: . feeding supplement (ENSURE ENLIVE)  237 mL Oral BID BM  . gabapentin  100 mg Oral BID  . OXcarbazepine  150 mg Oral BID  . pantoprazole (PROTONIX) IV  40 mg Intravenous Q24H  . potassium chloride  60 mEq Oral BID   Continuous Infusions: . heparin 1,450 Units/hr (09/10/15 1000)  . 0.9 % sodium chloride with kcl      Principal Problem:   Acute encephalopathy Active Problems:   Seizures (HCC)   CHF (congestive heart failure) (HCC)   Atrial fibrillation (HCC)   Hypertension   COPD (chronic obstructive pulmonary disease) (HCC)   Acute on chronic renal failure (HCC)   Normocytic anemia   Opioid dependence ([place])   Pulmonary arterial hypertension ([place])   Status post peripherally inserted central catheter (PICC) central line placement   Abdominal pain    Time spent: 45 minutes   [facility] Pager [phone]. If 7PM-7AM, please contact night-coverage at [email or web address], password [email or web address] 09/10/2015, 12:36 PM  LOS: 7 days

## 2015-09-10 NOTE — Progress Notes (Addendum)
Pt's Heparin levels were high. Pharmacy stopped the drip temporarily to monitor levels. Attending night MD notified. Will continue to monitor pt.

## 2015-09-10 NOTE — Progress Notes (Signed)
PHARMACY NOTE  Consult :  Heparin  Indication :  Afib  IBW  Wt :  68.4 kg  LABS :  Recent Labs  09/08/15 0330 09/08/15 1248 09/08/15 2111 09/09/15 0214 09/09/15 1225 09/09/15 2155 09/10/15 0600 09/10/15 0700 09/10/15 1806 09/10/15 2018  HGB 6.9* 7.3* 7.2* 7.5*  --   --   --  7.7* 7.6*  --   HCT 21.7* 23.0* 23.4* 23.8*  --   --   --  25.4* 24.1*  --   PLT 219 182 182 192  --   --   --  166 153  --   HEPARINUNFRC 0.44  --   --  <0.10*  --  0.23* 0.25*  --  1.04* 1.96*  CREATININE 4.45*  --   --  3.54* 3.10*  --   --  2.39*  --   --     MEDICATION: Infusion[s]: Infusions:  . 0.9 % sodium chloride with kcl 75 mL/hr at 09/10/15 1448   Heparin infusion ON HOLD   ASSESSMENT :  80 y.o. male is currently on Heparin infusion for Afib.   Heparin currently infusing at 1450 units/hr. Heparin level has rapidly climbed with last repeat Heparin level reported at 1.96 units/ml.  Heparin infusing through a central line with the Heparin levels drawn via peripheral stick.  On visual inspection, Heparin may have been infusing at a higher rate than indicated.  Heparin infusion has been placed on hold and follow up Heparin level and CBC will be checked in 4 hours.  GOAL :  Heparin Level  0.3 - 0.7 units/ml  PLAN : 1. Continue to hold Heparin pending review of follow up Heparin Levels. 2. Will check Heparin level and CBC at 0200 AM and evaluate potential restart at that time. 3. Daily Heparin Levels, Platelet counts, CBC.  Monitor for bleeding complications   Marthenia Rolling,  Pharm.D   09/10/2015,  10:36 PM

## 2015-09-10 NOTE — Progress Notes (Signed)
Physical Therapy Treatment Patient Details Name: Vincent Mason MRN: [id number] DOB: Jan 01, 1936 Today's Date: 09/10/2015    History of Present Illness pt presents with Encephalopathy, Respiratory Failure, and Renal Failure.  pt intubated 4/11-4/13.  pt with hx of A-fib, HF, Pulmonary HTN, COPD, CKD, and Seizures.      PT Comments    Pt admitted with above diagnosis. Pt currently with functional limitations due to balance and endurance deficits. Pt having BM in the bed but had not called nurse.  Assisted with cleaning pt but he kept going therefore got him on the bedside commode.  Pt sat on commode and rocked back and forth moaning as he could not "get the golf ball" out.  Feel that pt may be more appropriate for SNF as he is unable to tolerate more than a transfer at present time.  Will continue PT as pt tolerates.  Pt will benefit from skilled PT to increase their independence and safety with mobility to allow discharge to the venue listed below.    Follow Up Recommendations  SNF;Supervision/Assistance - 24 hour     Equipment Recommendations  Rolling walker with 5" wheels;3in1 (PT)    Recommendations for Other Services       Precautions / Restrictions Precautions Precautions: Fall Restrictions Weight Bearing Restrictions: No    Mobility  Bed Mobility Overal bed mobility: Needs Assistance;+2 for physical assistance Bed Mobility: Supine to Sit     Supine to sit: Mod assist;HOB elevated;+2 for physical assistance     General bed mobility comments: pt does A with bringing LEs towards EOB, but definitely needs2 person A for bringing trunk up to sitting.    Transfers Overall transfer level: Needs assistance Equipment used: 2 person hand held assist Transfers: Sit to/from [facility] Sit to Stand: Mod assist;+2 physical assistance Stand pivot transfers: Mod assist;+2 physical assistance;+2 safety/equipment       General transfer comment: pt does particpate   with transfer, but has difficulty staying on task and tends to grab for lines.  Needs step-by-step cues for safety.  Pt needed more assist to get from bed to 3N1 than from 3N1 to recliner.  Pt with very poor safety and overall needs mod assist for mobility.  Pt was lying in stool on arrival and cleaned pt and then pt tried to use bathroom on 3N1 with very little success.  Left pt up in chair.    Ambulation/Gait                 Stairs            Wheelchair Mobility    Modified Rankin (Stroke Patients Only)       Balance Overall balance assessment: Needs assistance;History of Falls Sitting-balance support: Feet supported;Bilateral upper extremity supported Sitting balance-Leahy Scale: Poor Sitting balance - Comments: Leans posteriorly, but does attempt to attend to balance, just needs MinA.  Postural control: Posterior lean Standing balance support: Bilateral upper extremity supported;During functional activity Standing balance-Leahy Scale: Poor Standing balance comment: Pt needed +2 assist to balance.  Pt shaky and unsteady.                      Cognition Arousal/Alertness: Awake/alert Behavior During Therapy: Anxious;Impulsive Overall Cognitive Status: Impaired/Different from baseline Area of Impairment: Awareness;Safety/judgement;Problem solving;Following commands       Following Commands: Follows one step commands inconsistently Safety/Judgement: Decreased awareness of safety Awareness: Intellectual Problem Solving: Difficulty sequencing;Requires verbal cues;Requires tactile cues General Comments: Pt very  fixated on bowels because they were moving throughout the session.  Poor safety.     Exercises      General Comments        Pertinent Vitals/Pain Pain Assessment: Faces Faces Pain Scale: Hurts even more Pain Location: bowels Pain Descriptors / Indicators: Grimacing;Guarding;Aching;Moaning;Pressure Pain Intervention(s): Limited activity within  patient's tolerance;Monitored during session;Repositioned  VSS    Home Living                      Prior Function            PT Goals (current goals can now be found in the care plan section) Progress towards PT goals: Not progressing toward goals - comment (pt self limiting due to bowel issues)    Frequency  Min 3X/week    PT Plan Discharge plan needs to be updated    Co-evaluation             End of Session Equipment Utilized During Treatment: Gait belt Activity Tolerance: Patient limited by fatigue;Patient limited by pain Patient left: in chair;with call bell/phone within reach;with chair alarm set;with nursing/sitter in room     Time: [id number] PT Time Calculation (min) (ACUTE ONLY): 34 min  Charges:  $Therapeutic Activity: 8-22 mins $Self Care/Home Management: 8-22                    G CodesIrwin Brakeman F 09-20-2015, 12:13 PM  Selma Mink,PT Acute Rehabilitation [phone] (pager)

## 2015-09-11 ENCOUNTER — Encounter (HOSPITAL_COMMUNITY): Payer: Self-pay | Admitting: Physician Assistant

## 2015-09-11 DIAGNOSIS — A047 Enterocolitis due to Clostridium difficile: Secondary | ICD-10-CM

## 2015-09-11 DIAGNOSIS — K625 Hemorrhage of anus and rectum: Secondary | ICD-10-CM

## 2015-09-11 DIAGNOSIS — I481 Persistent atrial fibrillation: Secondary | ICD-10-CM

## 2015-09-11 DIAGNOSIS — K7469 Other cirrhosis of liver: Secondary | ICD-10-CM

## 2015-09-11 LAB — PROTIME-INR
INR: 1.46 (ref 0.00–1.49)
Prothrombin Time: 17.8 seconds — ABNORMAL HIGH (ref 11.6–15.2)

## 2015-09-11 LAB — CBC
HCT: 24.6 % — ABNORMAL LOW (ref 39.0–52.0)
HCT: 24.6 % — ABNORMAL LOW (ref 39.0–52.0)
Hemoglobin: 7.6 g/dL — ABNORMAL LOW (ref 13.0–17.0)
Hemoglobin: 7.5 g/dL — ABNORMAL LOW (ref 13.0–17.0)
MCH: 27.8 pg (ref 26.0–34.0)
MCH: 27.7 pg (ref 26.0–34.0)
MCHC: 30.9 g/dL (ref 30.0–36.0)
MCHC: 30.5 g/dL (ref 30.0–36.0)
MCV: 90.1 fL (ref 78.0–100.0)
MCV: 90.8 fL (ref 78.0–100.0)
Platelets: 167 10*3/uL (ref 150–400)
Platelets: 175 10*3/uL (ref 150–400)
RBC: 2.73 MIL/uL — ABNORMAL LOW (ref 4.22–5.81)
RBC: 2.71 MIL/uL — ABNORMAL LOW (ref 4.22–5.81)
RDW: 19.4 % — ABNORMAL HIGH (ref 11.5–15.5)
RDW: 19.7 % — ABNORMAL HIGH (ref 11.5–15.5)
WBC: 18.5 10*3/uL — ABNORMAL HIGH (ref 4.0–10.5)
WBC: 23 10*3/uL — ABNORMAL HIGH (ref 4.0–10.5)

## 2015-09-11 LAB — COMPREHENSIVE METABOLIC PANEL
ALT: 32 U/L (ref 17–63)
AST: 42 U/L — ABNORMAL HIGH (ref 15–41)
Albumin: 2.6 g/dL — ABNORMAL LOW (ref 3.5–5.0)
Alkaline Phosphatase: 52 U/L (ref 38–126)
Anion gap: 10 (ref 5–15)
BUN: 59 mg/dL — ABNORMAL HIGH (ref 6–20)
CO2: 22 mmol/L (ref 22–32)
Calcium: 7.8 mg/dL — ABNORMAL LOW (ref 8.9–10.3)
Chloride: 107 mmol/L (ref 101–111)
Creatinine, Ser: 2.02 mg/dL — ABNORMAL HIGH (ref 0.61–1.24)
GFR calc Af Amer: 35 mL/min — ABNORMAL LOW (ref >60)
GFR calc non Af Amer: 30 mL/min — ABNORMAL LOW (ref >60)
Glucose, Bld: 110 mg/dL — ABNORMAL HIGH (ref 65–99)
Potassium: 3.5 mmol/L (ref 3.5–5.1)
Sodium: 139 mmol/L (ref 135–145)
Total Bilirubin: 1.3 mg/dL — ABNORMAL HIGH (ref 0.3–1.2)
Total Protein: 5.4 g/dL — ABNORMAL LOW (ref 6.5–8.1)

## 2015-09-11 LAB — C DIFFICILE QUICK SCREEN W PCR REFLEX
C Diff antigen: POSITIVE — AB
C Diff interpretation: POSITIVE
C Diff toxin: POSITIVE — AB

## 2015-09-11 LAB — HEPARIN LEVEL (UNFRACTIONATED)
Heparin Unfractionated: 0.12 IU/mL — ABNORMAL LOW (ref 0.30–0.70)
Heparin Unfractionated: 0.21 IU/mL — ABNORMAL LOW (ref 0.30–0.70)

## 2015-09-11 LAB — OCCULT BLOOD X 1 CARD TO LAB, STOOL: Fecal Occult Bld: NEGATIVE

## 2015-09-11 MED ORDER — SODIUM CHLORIDE 0.9% FLUSH
10.0000 mL | INTRAVENOUS | Status: DC | PRN
  Administered 2015-09-12 – 2015-09-14 (×3): 20 mL
  Administered 2015-09-15: 30 mL
  Administered 2015-09-16: 20 mL
  Filled 2015-09-11 (×5): qty 40

## 2015-09-11 MED ORDER — HEPARIN (PORCINE) IN NACL 100-0.45 UNIT/ML-% IJ SOLN
1450.0000 [IU]/h | INTRAMUSCULAR | Status: DC
  Administered 2015-09-11: 1300 [IU]/h via INTRAVENOUS

## 2015-09-11 MED ORDER — VANCOMYCIN HCL IN DEXTROSE 750-5 MG/150ML-% IV SOLN: 750.0000 mg | INTRAVENOUS | Status: DC

## 2015-09-11 MED ORDER — HYDROMORPHONE HCL 1 MG/ML IJ SOLN
1.0000 mg | INTRAMUSCULAR | Status: DC | PRN
  Administered 2015-09-11 – 2015-09-13 (×4): 1 mg via INTRAVENOUS
  Administered 2015-09-14: 2 mg via INTRAVENOUS
  Administered 2015-09-14 – 2015-09-15 (×3): 1 mg via INTRAVENOUS
  Filled 2015-09-11 (×2): qty 1
  Filled 2015-09-11: qty 2
  Filled 2015-09-11: qty 1
  Filled 2015-09-11 (×5): qty 2
  Filled 2015-09-11: qty 1

## 2015-09-11 MED ORDER — VANCOMYCIN 50 MG/ML ORAL SOLUTION: 125.0000 mg | Freq: Four times a day (QID) | ORAL | Status: DC

## 2015-09-11 MED ORDER — VANCOMYCIN 50 MG/ML ORAL SOLUTION
500.0000 mg | Freq: Four times a day (QID) | ORAL | Status: DC
  Filled 2015-09-11: qty 10

## 2015-09-11 MED ORDER — SACCHAROMYCES BOULARDII 250 MG PO CAPS
250.0000 mg | ORAL_CAPSULE | Freq: Two times a day (BID) | ORAL | Status: DC
  Administered 2015-09-11 – 2015-09-17 (×12): 250 mg via ORAL
  Filled 2015-09-11 (×12): qty 1

## 2015-09-11 MED ORDER — VANCOMYCIN HCL 10 G IV SOLR
1500.0000 mg | Freq: Once | INTRAVENOUS | Status: AC
  Administered 2015-09-11: 1500 mg via INTRAVENOUS
  Filled 2015-09-11: qty 1500

## 2015-09-11 MED ORDER — METRONIDAZOLE IN NACL 5-0.79 MG/ML-% IV SOLN
500.0000 mg | Freq: Three times a day (TID) | INTRAVENOUS | Status: DC
  Administered 2015-09-11 – 2015-09-15 (×12): 500 mg via INTRAVENOUS
  Filled 2015-09-11 (×12): qty 100

## 2015-09-11 MED ORDER — POLYETHYLENE GLYCOL 3350 17 G PO PACK: 17.0000 g | PACK | Freq: Every day | ORAL | Status: DC

## 2015-09-11 MED ORDER — VANCOMYCIN 50 MG/ML ORAL SOLUTION
125.0000 mg | Freq: Four times a day (QID) | ORAL | Status: DC
  Administered 2015-09-12 – 2015-09-17 (×23): 125 mg via ORAL
  Filled 2015-09-11 (×26): qty 2.5

## 2015-09-11 MED ORDER — SODIUM CHLORIDE 0.9% FLUSH
10.0000 mL | Freq: Two times a day (BID) | INTRAVENOUS | Status: DC
  Administered 2015-09-13: 30 mL
  Administered 2015-09-14 – 2015-09-15 (×3): 10 mL

## 2015-09-11 MED ORDER — ENOXAPARIN SODIUM 40 MG/0.4ML ~~LOC~~ SOLN
40.0000 mg | SUBCUTANEOUS | Status: DC
  Administered 2015-09-11 – 2015-09-16 (×6): 40 mg via SUBCUTANEOUS
  Filled 2015-09-11 (×6): qty 0.4

## 2015-09-11 MED ORDER — FAMOTIDINE 20 MG PO TABS
20.0000 mg | ORAL_TABLET | Freq: Every day | ORAL | Status: DC
  Administered 2015-09-11 – 2015-09-17 (×7): 20 mg via ORAL
  Filled 2015-09-11 (×7): qty 1

## 2015-09-11 MED ORDER — DICYCLOMINE HCL 20 MG PO TABS
20.0000 mg | ORAL_TABLET | Freq: Once | ORAL | Status: AC
  Administered 2015-09-12: 20 mg via ORAL
  Filled 2015-09-11: qty 1

## 2015-09-11 MED ORDER — PIPERACILLIN-TAZOBACTAM 3.375 G IVPB
3.3750 g | Freq: Three times a day (TID) | INTRAVENOUS | Status: DC
  Filled 2015-09-11 (×2): qty 50

## 2015-09-11 MED ORDER — HYDROMORPHONE HCL 1 MG/ML IJ SOLN: 1.0000 mg | INTRAMUSCULAR | Status: DC | PRN

## 2015-09-11 MED ORDER — PIPERACILLIN-TAZOBACTAM 3.375 G IVPB 30 MIN
3.3750 g | Freq: Once | INTRAVENOUS | Status: AC
  Administered 2015-09-11: 3.375 g via INTRAVENOUS
  Filled 2015-09-11: qty 50

## 2015-09-11 NOTE — Progress Notes (Signed)
CRITICAL VALUE ALERT  Critical value received:  C-Diff positive  Date of notification:  09/11/2015  Time of notification:  [id number] Critical value read back: yes   Nurse who received alert:  Mal Amabile, RN  MD notified (1st page):  Dr. Allyson Sabal  Time of first page:  1640  MD notified (2nd page):  Time of second page:  Responding MD:  Dr. Allyson Sabal  Time MD responded:  1700

## 2015-09-11 NOTE — Care Management Note (Signed)
Case Management Note  Patient Details  Name: TREYE LUDINGTON MRN: [id number] Date of Birth: 1935-09-01  Subjective/Objective:                 Patient from home with wife admitted with Acute Encephalopathy, now r/o c. diff. Patient fell at home and was unresponsive. Intubated in ED, extubated 4-13. Transferred from [facility].    Action/Plan:  Anticipate DC to SNF as facilitated by CSW when medically stable.  Expected Discharge Date:  09/13/15               Expected Discharge Plan:  [place]  In-House Referral:     Discharge planning Services  CM Consult  Post Acute Care Choice:    Choice offered to:     DME Arranged:    DME Agency:     HH Arranged:    HH Agency:     Status of Service:  In process, will continue to follow  Medicare Important Message Given:  Yes Date Medicare IM Given:    Medicare IM give by:    Date Additional Medicare IM Given:    Additional Medicare Important Message give by:     If discussed at [place] of Stay Meetings, dates discussed:    Additional Comments:  Carles Collet, RN 09/11/2015, 2:15 PM

## 2015-09-11 NOTE — Progress Notes (Signed)
ANTICOAGULATION CONSULT NOTE -   Pharmacy Consult for Heparin Indication: atrial fibrillation  Allergies  Allergen Reactions  . Decongestant  [Oxymetazoline]     Patient Measurements: Height: 5\' 8"  (172.7 cm) Weight: 192 lb 3.9 oz (87.2 kg) IBW/kg (Calculated) : 68.4 Heparin Dosing Weight:   Vital Signs: Temp: 98.3 F (36.8 C) (04/18 2125) Temp Source: Oral (04/18 1629) BP: 148/67 mmHg (04/18 2125) Pulse Rate: 85 (04/18 2125)  Labs:  Recent Labs  09/09/15 1225  09/10/15 0700 09/10/15 1806 09/10/15 2018 09/11/15 0205  HGB  --   --  7.7* 7.6*  --  7.6*  HCT  --   --  25.4* 24.1*  --  24.6*  PLT  --   --  166 153  --  167  HEPARINUNFRC  --   < >  --  1.04* 1.96* 0.12*  CREATININE 3.10*  --  2.39*  --   --  2.02*  < > = values in this interval not displayed.  Estimated Creatinine Clearance: 32.4 mL/min (by C-G formula based on Cr of 2.02).   Assessment: 80yo male on heparin bridge while Xarelto on hold.  Pt with extensive bruising due to fall but no bleeding. Heparin has been off the last 4 hours due two separate heparin levels. Hard to determine exactly what caused this event. HL 0.12 at present after holding heparin gtt for 4 hours. CBC stable with no overt sxs of bleeding.   Goal of Therapy:  Heparin level 0.3-0.7 units/ml Monitor platelets by anticoagulation protocol: Yes   Plan:  1. Resume heparin at lower rate of 1300 units/hr 2. Heparin level in 8 hours 3. Continue daily HL, CBC 4. Watch for s/s of bleeding   Vincenza Hews, PharmD, BCPS 09/11/2015, 2:50 AM Pager: [phone]

## 2015-09-11 NOTE — Progress Notes (Addendum)
[facility] PROGRESS NOTE  Vincent Mason [id number] DOB: 08/19/1935 DOA: 09/03/2015   PCP: Lelon Huh, MD  Length of stay: 8   Assessment/Plan: Principal Problem:   Acute encephalopathy Active Problems:   Seizures ([place])   CHF (congestive heart failure) (HCC)   Atrial fibrillation ([place])   Hypertension   COPD (chronic obstructive pulmonary disease) ([place])   Acute on chronic renal failure (HCC)   Normocytic anemia   Opioid dependence ([place])   Pulmonary arterial hypertension ([place])   Status post peripherally inserted central catheter (PICC) central line placement   Abdominal pain   HISTORY OF PRESENT ILLNESS:  Vincent Mason is a 80 year old male with a past medical history of A. Fib on Xarelto, Chronic Diastolic Heart Failure, Pulmonary HTN, COPD Unknown Stage, CKD Stage III, possible history of seizures presents to the ED after being found unresponsive at home. Wife reports that he was his normal self yesterday, woke up around 2 AM to use the bathroom and fell. Wife found him unresponsive and called EMS. EMS reports he had unequal pupils with left larger than right. He was intubated on arrival to the ED due to acute respiratory failure. Wife reports several episodes of staring spells and several episodes of unresponsiveness that have lasted 5-10 minutes the past several months. He was admitted 12/2014 with similar presentation and thought to be having seizures at that time and started on Trileptal with neurology follow up.  Patient transferred from pccm on 4/17, continued to have persistent leukocytosis, abdominal pain. CT scan of the chest of the abdomen revealed patchy bilateral airspace disease and possible right and left ischemic colitis. Patient now has profound diarrhea, started on antibiotics for colitis, rule out C. difficile, GI consultation requested and pending at this time    Assessment and plan Acute Hypoxic Respiratory Failure - dense bilateral infiltrates H/O  COPD Questionable H/O Pulmonary HTN ASP PNA? Doing well post extubation on RA now ,Extubated on 09/05/15 Lasix Hold 4/15-4/16 may now be dry, consider restarting based on Cr levels completed treatment for pneumonia with levaquin and vanc  However CT scan on 4/18 showed persistent bilateral bronchopneumonia, therefore reinitiated on vancomycin and Zosyn for HCAP, but now has developed c diff, therefore discontinued these abx again today as patient really has minimal pulmonary sx and his leukocytosis is probably due to c diff     Chronic Diastolic HF (per PCP note EF normal in 2015) Septic shock-resolved  H/O Atrial fibrillation - chronic anticoagulation w/ Xarelto H/O CAD s/p CABG H/O HTN H/O Hyperlipidemia Off pressors,dopamine has been weaned off Stopped diuresis 4/15 Echo EF 60-65% and moderate to severe pulmonary HTN. No role stress steroids, cortisol adequate. discontinued heparin due to positive FOBT, GI recommends holding heparin gtt for now as risk outweighs benefit  We are waiting for improvement of his renal fn ,resume  xarelto if ok with GI   RENAL improving ,likely ATN   Labs (Brief) 5.23>2.0  Patient diuresing well   Acute on Chronic Renal Failure Stage III, baseline 1.5, creatinine peaked at 5.65 AG acidosis Holding Lasix Continue gentle IV fluids pending resolution of renal failure     Questionable H/O GERD - On Zantac. Transaminitis - Mild. Resolved R/o gastric stress on xarelto Acute c diff colitis  Patient complained of persistent abdominal pain and found to have infectious versus ischemic colitis, now positive for C DIFF  Found to have profuse stooling and diarrhea,   C. difficile PCR positive ,  started on van PO and flagyl IV for severe c diff FOBT positive ,  also has a history of hemorrhoids, has seen a gastroenterologist in [place],   GI consult requested this admission,  Also need to assess candidacy for long-term anticoagulation in the setting of  his anemia, he does have a history of internal hemorrhoids   GI would like to hold heparin gtt for now     HEMATOLOGIC  Recent Labs (last 2 labs)      Recent Labs  09/07/15 0430 09/08/15 0330  HGB 7.5* 6.9*       Extensive Bruising - Likely secondary to Xarelto & falls. Anemia - Likely secondary to slow chronic blood loss. Coagulopathy - Secondary to Xarelto & acute on chronic renal failure. Leukocytosis - Sepsis versus stress response. CBC - Hgb 6.9 -s/p 1 unit PRBC Holding Xarelto and restart once renal fn back to baseline if ok with GI   Held  heparin drip.   needs outpt GI workup     Septic shock, PNA asp? atypical Possible UTI - Few bacteria seen on UA Lower ext with PVD abx resumed as CT scan shows persistent pneumonia      Acute Encephalopathy Recurrent Falls H/O Possible Seizures Chronic Narcotic & Benzodiazepine use D/C MRI EEG neg focus RASS goal: 0  Continue home Trileptal        Restless legs Started patient on Neurontin    DVT prophylaxsis heparin gtt dc >lovenox SQ for prophylaxsis   Code Status:      Code Status Orders    Full code     Start     Ordered     Family Communication: Discussed in detail with the patient, all imaging results, lab results explained to the patient   Disposition Plan:Anticipate discharge once pneumonia and colitis have improved may take several days ,PT eval     Consultants:  PCCM  GI     SIGNIFICANT EVENTS: 4/11 - Admit 4/11 shock, neo rising 4/12- improved shock 4/15 WEAN PRESSORS  LINES/TUBES: 09/03/2015 PIV Left Hand, Left Wrist 09/03/2015 Foley 09/03/2015 NG/OG Tube 09/03/2015 ETT>>>4/13 4/11 rt IJ>>> 4/11 rt aline>>>  Antibiotics: Levaquin 4/11-4/16 Vancomycin 4/11-4/14 Restarted vanc and zosyn 4/19        HPI/Subjective: Patient complaining of a significant amount of abdominal pain especially in the left and the right lower quadrant    Objective: Filed  Vitals:   09/10/15 0600 09/10/15 1629 09/10/15 2125 09/11/15 0547  BP: 112/77 128/56 148/67   Pulse: 86 78 85   Temp: 98.1 F (36.7 C) 97.9 F (36.6 C) 98.3 F (36.8 C)   TempSrc:  Oral    Resp: 28 16 18    Height:      Weight:    87.091 kg (192 lb)  SpO2: 97% 99% 96%     Intake/Output Summary (Last 24 hours) at 09/11/15 0907 Last data filed at 09/11/15 0714  Gross per 24 hour  Intake 182.94 ml  Output   1400 ml  Net -1217.06 ml    Exam:  Examination: General: alert and interactive. Neuro: AAOx3, able to keep conversation and do simple calculations, moves all ext purpsoefulyl. No focal deficits HEENT: line clean, no sig hematoma PULM: decreased in bases CV: s1 s2 irR GI: diffusely tender, bs wnl, non-distended. Ecchymosis over LL abdomen and across lower back Extremities: mild edema, lower ext ecchymosis. Changed pvd     Data Reviewed: I have personally reviewed following labs and imaging studies  Micro Results Recent  Results (from the past 240 hour(s))  Culture, Urine     Status: Abnormal   Collection Time: 09/03/15  5:10 AM  Result Value Ref Range Status   Specimen Description URINE, CATHETERIZED  Final   Special Requests Normal  Final   Culture 8,000 COLONIES/mL INSIGNIFICANT GROWTH (A)  Final   Report Status 09/04/2015 FINAL  Final  Culture, blood (routine x 2)     Status: None   Collection Time: 09/03/15  7:00 AM  Result Value Ref Range Status   Specimen Description BLOOD RIGHT ARM  Final   Special Requests   Final    BOTTLES DRAWN AEROBIC AND ANAEROBIC 10CC IN AEB 5CC IN ANA   Culture NO GROWTH 5 DAYS  Final   Report Status 09/08/2015 FINAL  Final  MRSA PCR Screening     Status: None   Collection Time: 09/03/15  7:11 AM  Result Value Ref Range Status   MRSA by PCR NEGATIVE NEGATIVE Final    Comment:        The GeneXpert MRSA Assay (FDA approved for NASAL specimens only), is one component of a comprehensive MRSA colonization surveillance program.  It is not intended to diagnose MRSA infection nor to guide or monitor treatment for MRSA infections.   Culture, blood (routine x 2)     Status: None   Collection Time: 09/03/15  7:15 AM  Result Value Ref Range Status   Specimen Description BLOOD RIGHT HAND  Final   Special Requests IN PEDIATRIC BOTTLE  3CC  Final   Culture NO GROWTH 5 DAYS  Final   Report Status 09/08/2015 FINAL  Final  Culture, respiratory (NON-Expectorated)     Status: None   Collection Time: 09/03/15  7:40 AM  Result Value Ref Range Status   Specimen Description TRACHEAL ASPIRATE  Final   Special Requests Normal  Final   Gram Stain   Final    FEW WBC PRESENT,BOTH PMN AND MONONUCLEAR MODERATE GRAM POSITIVE COCCI IN CHAINS Performed at [facility]    Culture   Final    NORMAL OROPHARYNGEAL FLORA Performed at [facility]    Report Status 09/05/2015 FINAL  Final  Respiratory virus panel     Status: None   Collection Time: 09/03/15  6:21 PM  Result Value Ref Range Status   Source - RVPAN NASAL SWAB  Corrected   Respiratory Syncytial Virus A Negative Negative Final   Respiratory Syncytial Virus B Negative Negative Final   Influenza A Negative Negative Final   Influenza B Negative Negative Final   Parainfluenza 1 Negative Negative Final   Parainfluenza 2 Negative Negative Final   Parainfluenza 3 Negative Negative Final   Metapneumovirus Negative Negative Final   Rhinovirus Negative Negative Final   Adenovirus Negative Negative Final    Comment: (NOTE) Performed At: [facility] [place], [place] [id number] Lindon Romp MD [id number]     Radiology Reports Ct Abdomen Pelvis Wo Contrast  09/10/2015  CLINICAL DATA:  Altered mental status changes and abdominal pain. EXAM: CT CHEST, ABDOMEN AND PELVIS WITHOUT CONTRAST TECHNIQUE: Multidetector CT imaging of the chest, abdomen and pelvis was performed following the standard protocol without IV contrast. COMPARISON:   Abdomen and pelvis CT from 09/03/2015. FINDINGS: CT CHEST FINDINGS Mediastinum/Lymph Nodes: There is no axillary lymphadenopathy. No mediastinal lymphadenopathy. There is no hilar lymphadenopathy. Heart is enlarged. Patient is status post CABG. The esophagus has normal imaging features. Right-sided central line tip is positioned in the mid  SVC. Lungs/Pleura: There is some dependent mucus in the distal trachea. Peripheral nodularity is seen in the upper lobes bilaterally with a peribronchovascular distribution suggesting sequelae of atypical infection. There is central interstitial and alveolar ground-glass attenuation in the right middle lobe with ill-defined central nodularity in the lower lobes bilaterally having a peribronchovascular distribution. There is dependent patchy airspace disease in both lower lobes. No pleural effusion. No suspicious pulmonary mass. Musculoskeletal: Bone windows reveal no worrisome lytic or sclerotic osseous lesions. CT ABDOMEN PELVIS FINDINGS Hepatobiliary: Liver contour has a nodular configuration suggesting underlying cirrhosis. No focal lesion is evident in the liver parenchyma on this study performed without intravenous contrast material. No evidence for gallstones. No intrahepatic or extrahepatic biliary dilation. Pancreas: No focal mass lesion. No dilatation of the main duct. No intraparenchymal cyst. No peripancreatic edema. Spleen: No splenomegaly. No focal mass lesion. Adrenals/Urinary Tract: Right adrenal gland is normal in appearance. 3.0 cm myelolipoma of the left adrenal gland noted. 5 mm nonobstructing stone is identified in the upper pole of the right kidney. 2 mm nonobstructing interpolar right kidney stone evident. No stones are identified in the left kidney. No evidence for hydroureter. Urinary bladder is decompressed and gas in the bladder is compatible with the presence of a Foley catheter. Stomach/Bowel: Stomach is nondistended. No gastric wall thickening. No  evidence of outlet obstruction. Duodenum is normally positioned as is the ligament of Treitz. No small bowel wall thickening. No small bowel dilatation. The terminal ileum is normal. The appendix is not visualized. There appears to be eccentric and irregular wall thickening in the ascending colon. Transverse colon has normal CT imaging features as does the splenic flexure. Mid descending colon shows circumferential wall thickening with ill-defined colonic wall margin and this appearance extends distally to the level of the rectum. There is pericolonic edema/inflammation around the right colon, left colon and sigmoid colon. Vascular/Lymphatic: There is abdominal aortic atherosclerosis without aneurysm. 13 mm left para-aortic short axis lymph node on image 73 series 201 is mildly enlarged. No other abdominal lymphadenopathy is evident. No evidence for pelvic sidewall lymphadenopathy. Reproductive: Dystrophic calcification is seen in the prostate gland. Other: Trace intraperitoneal free fluid is evident. Musculoskeletal: Bone windows reveal no worrisome lytic or sclerotic osseous lesions. Mixed attenuation fluid collection is identified superficial to the musculature of the right hip, suggesting hematoma. IMPRESSION: 1. Patchy bilateral airspace disease suggest bronchopneumonia. This is superimposed on probable sequelae of chronic atypical infection. 2. Wall thickening in the right colon, or descending colon and sigmoid colon is associated with pericolonic edema/inflammation. Imaging features suggest an infectious/inflammatory colitis. Given involvement of the right and left colon, ischemic colitis is considered a less likely possibility. 3. Nodular hepatic contour suggests cirrhosis. 4. Hematoma overlies the musculature of the right hip. 5. 3 cm left adrenal myelolipoma 6. Nonobstructing right nephrolithiasis. 7. Abdominal aortic atherosclerosis. Electronically Signed   By: Misty Stanley M.D.   On: 09/10/2015 19:30    Ct Abdomen Pelvis Wo Contrast  09/03/2015  CLINICAL DATA:  Unresponsive. EXAM: CT ABDOMEN AND PELVIS WITHOUT CONTRAST TECHNIQUE: Multidetector CT imaging of the abdomen and pelvis was performed following the standard protocol without IV contrast. COMPARISON:  None. FINDINGS: Dense consolidation in both lung bases with air bronchograms. This could represent pneumonia, aspiration, hemorrhage. There are unremarkable unenhanced appearances of the liver, gallbladder, bile ducts, pancreas, spleen, adrenals and kidneys. Renal vascular calcifications are present. There are extensive atherosclerotic calcifications of the normal caliber aorta. There is a nasogastric tube extending into the  stomach. Bowel is otherwise unremarkable. No bowel obstruction. No extraluminal air. No acute inflammatory changes are evident in the abdomen or pelvis. There is no ascites. No significant musculoskeletal lesion is evident. IMPRESSION: 1. No acute findings are evident in the abdomen or pelvis. 2. Dense consolidation in both lung bases Electronically Signed   By: Andreas Newport M.D.   On: 09/03/2015 06:05   Ct Head Wo Contrast  09/03/2015  CLINICAL DATA:  Unresponsive. EXAM: CT HEAD WITHOUT CONTRAST CT CERVICAL SPINE WITHOUT CONTRAST TECHNIQUE: Multidetector CT imaging of the head and cervical spine was performed following the standard protocol without intravenous contrast. Multiplanar CT image reconstructions of the cervical spine were also generated. COMPARISON:  01/17/2015 FINDINGS: CT HEAD FINDINGS There is no intracranial hemorrhage, mass or evidence of acute infarction. There is no extra-axial fluid collection. There is moderate generalized atrophy. Possible remote lacunar infarctions in the left pons and left basal ganglia. No bony abnormality. CT CERVICAL SPINE FINDINGS The vertebral column, pedicles and facet articulations are intact. There is no evidence of acute fracture. No acute soft tissue abnormalities are evident.  Mild motion degradation of the images.  Endotracheal tube noted. IMPRESSION: 1. No acute intracranial findings. Mild generalized atrophy and remote lacunar infarctions, unchanged. 2. Negative for acute cervical spine fracture. Mildly motion degraded study. Critical Value/emergent results were called by telephone at the time of interpretation on 09/03/2015 at 3:39 am to Dr. Wallie Char, who verbally acknowledged these results. Electronically Signed   By: Andreas Newport M.D.   On: 09/03/2015 03:40   Ct Chest Wo Contrast  09/10/2015  CLINICAL DATA:  Altered mental status changes and abdominal pain. EXAM: CT CHEST, ABDOMEN AND PELVIS WITHOUT CONTRAST TECHNIQUE: Multidetector CT imaging of the chest, abdomen and pelvis was performed following the standard protocol without IV contrast. COMPARISON:  Abdomen and pelvis CT from 09/03/2015. FINDINGS: CT CHEST FINDINGS Mediastinum/Lymph Nodes: There is no axillary lymphadenopathy. No mediastinal lymphadenopathy. There is no hilar lymphadenopathy. Heart is enlarged. Patient is status post CABG. The esophagus has normal imaging features. Right-sided central line tip is positioned in the mid SVC. Lungs/Pleura: There is some dependent mucus in the distal trachea. Peripheral nodularity is seen in the upper lobes bilaterally with a peribronchovascular distribution suggesting sequelae of atypical infection. There is central interstitial and alveolar ground-glass attenuation in the right middle lobe with ill-defined central nodularity in the lower lobes bilaterally having a peribronchovascular distribution. There is dependent patchy airspace disease in both lower lobes. No pleural effusion. No suspicious pulmonary mass. Musculoskeletal: Bone windows reveal no worrisome lytic or sclerotic osseous lesions. CT ABDOMEN PELVIS FINDINGS Hepatobiliary: Liver contour has a nodular configuration suggesting underlying cirrhosis. No focal lesion is evident in the liver parenchyma on  this study performed without intravenous contrast material. No evidence for gallstones. No intrahepatic or extrahepatic biliary dilation. Pancreas: No focal mass lesion. No dilatation of the main duct. No intraparenchymal cyst. No peripancreatic edema. Spleen: No splenomegaly. No focal mass lesion. Adrenals/Urinary Tract: Right adrenal gland is normal in appearance. 3.0 cm myelolipoma of the left adrenal gland noted. 5 mm nonobstructing stone is identified in the upper pole of the right kidney. 2 mm nonobstructing interpolar right kidney stone evident. No stones are identified in the left kidney. No evidence for hydroureter. Urinary bladder is decompressed and gas in the bladder is compatible with the presence of a Foley catheter. Stomach/Bowel: Stomach is nondistended. No gastric wall thickening. No evidence of outlet obstruction. Duodenum is normally positioned as is the  ligament of Treitz. No small bowel wall thickening. No small bowel dilatation. The terminal ileum is normal. The appendix is not visualized. There appears to be eccentric and irregular wall thickening in the ascending colon. Transverse colon has normal CT imaging features as does the splenic flexure. Mid descending colon shows circumferential wall thickening with ill-defined colonic wall margin and this appearance extends distally to the level of the rectum. There is pericolonic edema/inflammation around the right colon, left colon and sigmoid colon. Vascular/Lymphatic: There is abdominal aortic atherosclerosis without aneurysm. 13 mm left para-aortic short axis lymph node on image 73 series 201 is mildly enlarged. No other abdominal lymphadenopathy is evident. No evidence for pelvic sidewall lymphadenopathy. Reproductive: Dystrophic calcification is seen in the prostate gland. Other: Trace intraperitoneal free fluid is evident. Musculoskeletal: Bone windows reveal no worrisome lytic or sclerotic osseous lesions. Mixed attenuation fluid collection  is identified superficial to the musculature of the right hip, suggesting hematoma. IMPRESSION: 1. Patchy bilateral airspace disease suggest bronchopneumonia. This is superimposed on probable sequelae of chronic atypical infection. 2. Wall thickening in the right colon, or descending colon and sigmoid colon is associated with pericolonic edema/inflammation. Imaging features suggest an infectious/inflammatory colitis. Given involvement of the right and left colon, ischemic colitis is considered a less likely possibility. 3. Nodular hepatic contour suggests cirrhosis. 4. Hematoma overlies the musculature of the right hip. 5. 3 cm left adrenal myelolipoma 6. Nonobstructing right nephrolithiasis. 7. Abdominal aortic atherosclerosis. Electronically Signed   By: Misty Stanley M.D.   On: 09/10/2015 19:30   Ct Cervical Spine Wo Contrast  09/03/2015  CLINICAL DATA:  Unresponsive. EXAM: CT HEAD WITHOUT CONTRAST CT CERVICAL SPINE WITHOUT CONTRAST TECHNIQUE: Multidetector CT imaging of the head and cervical spine was performed following the standard protocol without intravenous contrast. Multiplanar CT image reconstructions of the cervical spine were also generated. COMPARISON:  01/17/2015 FINDINGS: CT HEAD FINDINGS There is no intracranial hemorrhage, mass or evidence of acute infarction. There is no extra-axial fluid collection. There is moderate generalized atrophy. Possible remote lacunar infarctions in the left pons and left basal ganglia. No bony abnormality. CT CERVICAL SPINE FINDINGS The vertebral column, pedicles and facet articulations are intact. There is no evidence of acute fracture. No acute soft tissue abnormalities are evident. Mild motion degradation of the images.  Endotracheal tube noted. IMPRESSION: 1. No acute intracranial findings. Mild generalized atrophy and remote lacunar infarctions, unchanged. 2. Negative for acute cervical spine fracture. Mildly motion degraded study. Critical Value/emergent  results were called by telephone at the time of interpretation on 09/03/2015 at 3:39 am to Dr. Wallie Char, who verbally acknowledged these results. Electronically Signed   By: Andreas Newport M.D.   On: 09/03/2015 03:40   US Renal Port  09/04/2015  CLINICAL DATA:  Acute onset of renal insufficiency. EXAM: RENAL / URINARY TRACT ULTRASOUND COMPLETE COMPARISON:  CT of the abdomen and pelvis from 09/03/2015 FINDINGS: Right Kidney: Length: 11.8 cm. Slightly increased parenchymal echogenicity noted. No mass or hydronephrosis visualized. Left Kidney: Length: 13.0 cm. Slightly increased parenchymal echogenicity noted. No mass or hydronephrosis visualized. Bladder: Largely decompressed, with a Foley catheter in place. IMPRESSION: 1. No evidence of hydronephrosis. 2. Slightly increased renal parenchymal echogenicity could reflect mild medical renal disease. Electronically Signed   By: Garald Balding M.D.   On: 09/04/2015 02:55   Dg Chest Port 1 View  09/08/2015  CLINICAL DATA:  80 year old male with history of respiratory failure. History of hypertension, COPD and atrial fibrillation. Former smoker. EXAM:  PORTABLE CHEST 1 VIEW COMPARISON:  Chest x-ray 09/06/2015. FINDINGS: There is a right-sided internal jugular central venous catheter with tip terminating in the superior cavoatrial junction. Patient is in a very lordotic position. There is cephalization of the pulmonary vasculature and slight indistinctness of the interstitial markings suggestive of mild pulmonary edema. No pleural effusions. Moderate cardiomegaly. Upper mediastinal contours are within normal limits. Status post median sternotomy for CABG, including LIMA. IMPRESSION: 1. The appearance the chest suggests mild congestive heart failure, as above. 2. Atherosclerosis. Electronically Signed   By: [name] M.D.   On: 09/08/2015 07:57   Dg Chest Port 1 View  09/06/2015  CLINICAL DATA:  Respiratory failure, recent extubation, patient is  experiencing hemoptysis. History of CHF and COPD. EXAM: PORTABLE CHEST 1 VIEW COMPARISON:  Portable chest x-ray of September 05, 2015 FINDINGS: The lungs are well-expanded. There is no focal infiltrate. The interstitial markings are coarse but improved in the right lower lobe. The cardiac silhouette remains enlarged. The pulmonary vascularity is prominent centrally. There is no significant pulmonary edema. No pleural effusion or pneumothorax is evident. The right internal jugular venous catheter tip projects over the midportion of the SVC. The patient has undergone previous median sternotomy. IMPRESSION: Improved aeration of both lungs. Persistent mild interstitial edema or pneumonia medially in the right lower lobe. Stable cardiomegaly and central pulmonary vascular congestion. Electronically Signed   By: David  Martinique M.D.   On: 09/06/2015 07:29   Dg Chest Port 1 View  09/05/2015  CLINICAL DATA:  CHF, COPD, respiratory failure, renal failure. EXAM: PORTABLE CHEST 1 VIEW COMPARISON:  Portable chest x-ray of September 04, 2015 FINDINGS: The cardiac silhouette remains enlarged. The lungs are well-expanded. There is hazy increased density in the right lower lobe more conspicuous today. There is no significant pleural effusion. The endotracheal tube tip lies approximately 6.3 cm above the carina. The esophagogastric tube tip projects below the inferior margin of the image. The right internal jugular venous catheter tip projects over the midportion of the SVC. IMPRESSION: Right lower lobe pneumonia. Mild cardiomegaly with mild central pulmonary vascular congestion but decreased pulmonary interstitial edema. The support tubes are in stable position. Electronically Signed   By: David  Martinique M.D.   On: 09/05/2015 07:56   Dg Chest Port 1 View  09/04/2015  CLINICAL DATA:  Respiratory failure, intubated EXAM: PORTABLE CHEST 1 VIEW COMPARISON:  09/03/2015 FINDINGS: Cardiomegaly again noted. Hazy bilateral lower lobe atelectasis  or infiltrate right greater than left. No convincing pulmonary edema. Status post CABG. NG tube in place. Endotracheal tube in place with tip 4.7 cm above the carina. Stable right IJ central line with tip in distal SVC. There is no pneumothorax. IMPRESSION: No convincing pulmonary edema. Status post CABG. Cardiomegaly again noted. Persistent bilateral lower lobe hazy atelectasis or infiltrate right greater than left. Stable support apparatus. Electronically Signed   By: Lahoma Crocker M.D.   On: 09/04/2015 11:40   Dg Chest Port 1 View  09/03/2015  CLINICAL DATA:  80 year old male status post PICC line placement. Initial encounter. EXAM: PORTABLE CHEST 1 VIEW COMPARISON:  CT Abdomen and Pelvis 0534 hours today. FINDINGS: Portable AP semi upright view at 1218 hours. Pacing pads overlie the chest. No PICC line is identified. There is a right IJ approach central line, tip at the level of the carina. Bilateral lung base consolidation re- demonstrated. There is cardiomegaly. Other mediastinal contours are within normal limits. Sequelae of CABG. No pneumothorax or pulmonary edema. No definite pleural  effusion. IMPRESSION: 1. Right IJ central line placed, tip at the lower SVC level. No pneumothorax. 2. No PICC line identified. 3. Bilateral lower lobe consolidation compatible with bilateral pneumonia. Cardiomegaly. Electronically Signed   By: Genevie Ann M.D.   On: 09/03/2015 13:14   Dg Chest Port 1 View  09/03/2015  CLINICAL DATA:  Respiratory failure EXAM: PORTABLE CHEST 1 VIEW COMPARISON:  None. FINDINGS: The endotracheal tube is 5.2 cm above the carina. There is marked cardiomegaly, unchanged. There is extensive vascular and interstitial fullness which has worsened. There is worsening perihilar alveolar opacity, likely edema. No pneumothorax. No large effusions. IMPRESSION: Satisfactory ET tube position. Worsening changes of congestive heart failure with interstitial and alveolar edema. Electronically Signed   By: Andreas Newport M.D.   On: 09/03/2015 03:18   Dg Abd Portable 1v  09/06/2015  CLINICAL DATA:  Abdominal pain EXAM: PORTABLE ABDOMEN - 1 VIEW COMPARISON:  CT abdomen and pelvis September 03, 2015 FINDINGS: There is a single loop of borderline prominent small bowel in the upper abdomen just to the left of midline. No other bowel dilatation is noted. There is no air-fluid level. No free air. A rectal thermometer is in place. IMPRESSION: Single loop of slightly dilated small bowel. This finding could be indicative of early ileus or enteritis. Obstruction is not felt to be likely. No free air evident on this supine examination. Electronically Signed   By: Lowella Grip III M.D.   On: 09/06/2015 17:07     CBC  Recent Labs Lab 09/05/15 0600  09/08/15 2111 09/09/15 0214 09/10/15 0700 09/10/15 1806 09/11/15 0205  WBC 20.2*  < > 21.1* 20.2* 15.8* 19.7* 18.5*  HGB 7.7*  < > 7.2* 7.5* 7.7* 7.6* 7.6*  HCT 23.6*  < > 23.4* 23.8* 25.4* 24.1* 24.6*  PLT 322  < > 182 192 166 153 167  MCV 88.1  < > 88.0 87.5 90.1 89.9 90.1  MCH 28.7  < > 27.1 27.6 27.3 28.4 27.8  MCHC 32.6  < > 30.8 31.5 30.3 31.5 30.9  RDW 17.5*  < > 18.8* 18.7* 19.0* 19.2* 19.4*  LYMPHSABS 1.0  --   --   --   --   --   --   MONOABS 0.9  --   --   --   --   --   --   EOSABS 0.1  --   --   --   --   --   --   BASOSABS 0.0  --   --   --   --   --   --   < > = values in this interval not displayed.  Chemistries   Recent Labs Lab 09/05/15 0600 09/06/15 0426  09/06/15 1810 09/07/15 0430 09/08/15 0330 09/09/15 0214 09/09/15 1225 09/10/15 0700 09/11/15 0205  NA 135 139  < >  --  141 141 141 143 143 139  K 2.9* 3.2*  < >  --  2.8* 2.8* 2.5* 2.7* 2.9* 3.5  CL 97* 95*  < >  --  93* 97* 100* 103 106 107  CO2 21* 25  < >  --  29 29 29 27 25 22   GLUCOSE 131* 117*  < >  --  129* 130* 108* 121* 143* 110*  BUN 147* 163*  < >  --  159* 144* 111* 97* 75* 59*  CREATININE 5.26* 5.23*  < >  --  5.22* 4.45* 3.54* 3.10* 2.39* 2.02*  CALCIUM 7.8*  8.2*  < >  --  8.4* 8.0* 8.0* 8.3* 8.2* 7.8*  MG  --  2.1  --  1.9 2.1 2.1  --  2.1  --   --   AST 41  --   --   --   --   --  45*  --  53* 42*  ALT 42  --   --   --   --   --  31  --  35 32  ALKPHOS 55  --   --   --   --   --  60  --  62 52  BILITOT 0.9  --   --   --   --   --  1.2  --  1.3* 1.3*  < > = values in this interval not displayed. ------------------------------------------------------------------------------------------------------------------ estimated creatinine clearance is 32.4 mL/min (by C-G formula based on Cr of 2.02). ------------------------------------------------------------------------------------------------------------------ No results for input(s): HGBA1C in the last 72 hours. ------------------------------------------------------------------------------------------------------------------ No results for input(s): CHOL, HDL, LDLCALC, TRIG, CHOLHDL, LDLDIRECT in the last 72 hours. ------------------------------------------------------------------------------------------------------------------ No results for input(s): TSH, T4TOTAL, T3FREE, THYROIDAB in the last 72 hours.  Invalid input(s): FREET3 ------------------------------------------------------------------------------------------------------------------ No results for input(s): VITAMINB12, FOLATE, FERRITIN, TIBC, IRON, RETICCTPCT in the last 72 hours.  Coagulation profile No results for input(s): INR, PROTIME in the last 168 hours.  No results for input(s): DDIMER in the last 72 hours.  Cardiac Enzymes No results for input(s): CKMB, TROPONINI, MYOGLOBIN in the last 168 hours.  Invalid input(s): CK ------------------------------------------------------------------------------------------------------------------ Invalid input(s): POCBNP   CBG:  Recent Labs Lab 09/08/15 0419 09/08/15 1252 09/08/15 1638 09/08/15 1959 09/09/15 0003  GLUCAP 135* 126* 149* 171* 109*       Studies: Ct Abdomen  Pelvis Wo Contrast  09/10/2015  CLINICAL DATA:  Altered mental status changes and abdominal pain. EXAM: CT CHEST, ABDOMEN AND PELVIS WITHOUT CONTRAST TECHNIQUE: Multidetector CT imaging of the chest, abdomen and pelvis was performed following the standard protocol without IV contrast. COMPARISON:  Abdomen and pelvis CT from 09/03/2015. FINDINGS: CT CHEST FINDINGS Mediastinum/Lymph Nodes: There is no axillary lymphadenopathy. No mediastinal lymphadenopathy. There is no hilar lymphadenopathy. Heart is enlarged. Patient is status post CABG. The esophagus has normal imaging features. Right-sided central line tip is positioned in the mid SVC. Lungs/Pleura: There is some dependent mucus in the distal trachea. Peripheral nodularity is seen in the upper lobes bilaterally with a peribronchovascular distribution suggesting sequelae of atypical infection. There is central interstitial and alveolar ground-glass attenuation in the right middle lobe with ill-defined central nodularity in the lower lobes bilaterally having a peribronchovascular distribution. There is dependent patchy airspace disease in both lower lobes. No pleural effusion. No suspicious pulmonary mass. Musculoskeletal: Bone windows reveal no worrisome lytic or sclerotic osseous lesions. CT ABDOMEN PELVIS FINDINGS Hepatobiliary: Liver contour has a nodular configuration suggesting underlying cirrhosis. No focal lesion is evident in the liver parenchyma on this study performed without intravenous contrast material. No evidence for gallstones. No intrahepatic or extrahepatic biliary dilation. Pancreas: No focal mass lesion. No dilatation of the main duct. No intraparenchymal cyst. No peripancreatic edema. Spleen: No splenomegaly. No focal mass lesion. Adrenals/Urinary Tract: Right adrenal gland is normal in appearance. 3.0 cm myelolipoma of the left adrenal gland noted. 5 mm nonobstructing stone is identified in the upper pole of the right kidney. 2 mm  nonobstructing interpolar right kidney stone evident. No stones are identified in the left kidney. No evidence for hydroureter. Urinary bladder is decompressed and gas in  the bladder is compatible with the presence of a Foley catheter. Stomach/Bowel: Stomach is nondistended. No gastric wall thickening. No evidence of outlet obstruction. Duodenum is normally positioned as is the ligament of Treitz. No small bowel wall thickening. No small bowel dilatation. The terminal ileum is normal. The appendix is not visualized. There appears to be eccentric and irregular wall thickening in the ascending colon. Transverse colon has normal CT imaging features as does the splenic flexure. Mid descending colon shows circumferential wall thickening with ill-defined colonic wall margin and this appearance extends distally to the level of the rectum. There is pericolonic edema/inflammation around the right colon, left colon and sigmoid colon. Vascular/Lymphatic: There is abdominal aortic atherosclerosis without aneurysm. 13 mm left para-aortic short axis lymph node on image 73 series 201 is mildly enlarged. No other abdominal lymphadenopathy is evident. No evidence for pelvic sidewall lymphadenopathy. Reproductive: Dystrophic calcification is seen in the prostate gland. Other: Trace intraperitoneal free fluid is evident. Musculoskeletal: Bone windows reveal no worrisome lytic or sclerotic osseous lesions. Mixed attenuation fluid collection is identified superficial to the musculature of the right hip, suggesting hematoma. IMPRESSION: 1. Patchy bilateral airspace disease suggest bronchopneumonia. This is superimposed on probable sequelae of chronic atypical infection. 2. Wall thickening in the right colon, or descending colon and sigmoid colon is associated with pericolonic edema/inflammation. Imaging features suggest an infectious/inflammatory colitis. Given involvement of the right and left colon, ischemic colitis is considered a  less likely possibility. 3. Nodular hepatic contour suggests cirrhosis. 4. Hematoma overlies the musculature of the right hip. 5. 3 cm left adrenal myelolipoma 6. Nonobstructing right nephrolithiasis. 7. Abdominal aortic atherosclerosis. Electronically Signed   By: Misty Stanley M.D.   On: 09/10/2015 19:30   Ct Chest Wo Contrast  09/10/2015  CLINICAL DATA:  Altered mental status changes and abdominal pain. EXAM: CT CHEST, ABDOMEN AND PELVIS WITHOUT CONTRAST TECHNIQUE: Multidetector CT imaging of the chest, abdomen and pelvis was performed following the standard protocol without IV contrast. COMPARISON:  Abdomen and pelvis CT from 09/03/2015. FINDINGS: CT CHEST FINDINGS Mediastinum/Lymph Nodes: There is no axillary lymphadenopathy. No mediastinal lymphadenopathy. There is no hilar lymphadenopathy. Heart is enlarged. Patient is status post CABG. The esophagus has normal imaging features. Right-sided central line tip is positioned in the mid SVC. Lungs/Pleura: There is some dependent mucus in the distal trachea. Peripheral nodularity is seen in the upper lobes bilaterally with a peribronchovascular distribution suggesting sequelae of atypical infection. There is central interstitial and alveolar ground-glass attenuation in the right middle lobe with ill-defined central nodularity in the lower lobes bilaterally having a peribronchovascular distribution. There is dependent patchy airspace disease in both lower lobes. No pleural effusion. No suspicious pulmonary mass. Musculoskeletal: Bone windows reveal no worrisome lytic or sclerotic osseous lesions. CT ABDOMEN PELVIS FINDINGS Hepatobiliary: Liver contour has a nodular configuration suggesting underlying cirrhosis. No focal lesion is evident in the liver parenchyma on this study performed without intravenous contrast material. No evidence for gallstones. No intrahepatic or extrahepatic biliary dilation. Pancreas: No focal mass lesion. No dilatation of the main duct.  No intraparenchymal cyst. No peripancreatic edema. Spleen: No splenomegaly. No focal mass lesion. Adrenals/Urinary Tract: Right adrenal gland is normal in appearance. 3.0 cm myelolipoma of the left adrenal gland noted. 5 mm nonobstructing stone is identified in the upper pole of the right kidney. 2 mm nonobstructing interpolar right kidney stone evident. No stones are identified in the left kidney. No evidence for hydroureter. Urinary bladder is decompressed and gas in the  bladder is compatible with the presence of a Foley catheter. Stomach/Bowel: Stomach is nondistended. No gastric wall thickening. No evidence of outlet obstruction. Duodenum is normally positioned as is the ligament of Treitz. No small bowel wall thickening. No small bowel dilatation. The terminal ileum is normal. The appendix is not visualized. There appears to be eccentric and irregular wall thickening in the ascending colon. Transverse colon has normal CT imaging features as does the splenic flexure. Mid descending colon shows circumferential wall thickening with ill-defined colonic wall margin and this appearance extends distally to the level of the rectum. There is pericolonic edema/inflammation around the right colon, left colon and sigmoid colon. Vascular/Lymphatic: There is abdominal aortic atherosclerosis without aneurysm. 13 mm left para-aortic short axis lymph node on image 73 series 201 is mildly enlarged. No other abdominal lymphadenopathy is evident. No evidence for pelvic sidewall lymphadenopathy. Reproductive: Dystrophic calcification is seen in the prostate gland. Other: Trace intraperitoneal free fluid is evident. Musculoskeletal: Bone windows reveal no worrisome lytic or sclerotic osseous lesions. Mixed attenuation fluid collection is identified superficial to the musculature of the right hip, suggesting hematoma. IMPRESSION: 1. Patchy bilateral airspace disease suggest bronchopneumonia. This is superimposed on probable sequelae  of chronic atypical infection. 2. Wall thickening in the right colon, or descending colon and sigmoid colon is associated with pericolonic edema/inflammation. Imaging features suggest an infectious/inflammatory colitis. Given involvement of the right and left colon, ischemic colitis is considered a less likely possibility. 3. Nodular hepatic contour suggests cirrhosis. 4. Hematoma overlies the musculature of the right hip. 5. 3 cm left adrenal myelolipoma 6. Nonobstructing right nephrolithiasis. 7. Abdominal aortic atherosclerosis. Electronically Signed   By: Misty Stanley M.D.   On: 09/10/2015 19:30      Lab Results  Component Value Date   HGBA1C 6.2* 01/17/2015   HGBA1C SEE COMMENT 09/01/2011   Lab Results  Component Value Date   LDLCALC 39 09/17/2014   CREATININE 2.02* 09/11/2015       Scheduled Meds: . diazepam  2 mg Oral Q8H  . digoxin  0.0625 mg Oral Daily  . febuxostat  80 mg Oral Daily  . feeding supplement (ENSURE ENLIVE)  237 mL Oral BID BM  . gabapentin  100 mg Oral BID  . OXcarbazepine  150 mg Oral BID  . pantoprazole  40 mg Oral BID  . piperacillin-tazobactam  3.375 g Intravenous Once  . piperacillin-tazobactam (ZOSYN)  IV  3.375 g Intravenous Q8H  . polyethylene glycol  17 g Oral Daily  . potassium chloride  60 mEq Oral BID  . rOPINIRole  2 mg Oral QHS  . vancomycin  1,500 mg Intravenous Once  . [START ON 09/12/2015] vancomycin  750 mg Intravenous Q24H   Continuous Infusions: . heparin 1,300 Units/hr (09/11/15 0250)    Principal Problem:   Acute encephalopathy Active Problems:   Seizures (HCC)   CHF (congestive heart failure) (HCC)   Atrial fibrillation (HCC)   Hypertension   COPD (chronic obstructive pulmonary disease) (HCC)   Acute on chronic renal failure (HCC)   Normocytic anemia   Opioid dependence ([place])   Pulmonary arterial hypertension ([place])   Status post peripherally inserted central catheter (PICC) central line placement   Abdominal  pain    Time spent: 45 minutes   [facility] Pager [phone]. If 7PM-7AM, please contact night-coverage at [email or web address], password [facility] 09/11/2015, 9:07 AM  LOS: 8 days

## 2015-09-11 NOTE — Progress Notes (Signed)
Pharmacy Antibiotic Note Vincent Mason is a 80 y.o. male admitted on 09/03/2015 that pharmacy has been consulted to dose Zosyn and vancomycin for IAA and pneumonia. SCr improving  Plan: 1. Zosyn 3.375g IV q8h (4 hour infusion).  2. Vancomycin 1500 mg x 1 followed by 750 mg Q 24H starting on 4/20 3. If vancomycin continued will obtain level at [facility]; goal 15 - 20 4. Watch renal fxn trend and adjust doses of abx as needed  Height: 5\' 8"  (172.7 cm) Weight: 192 lb (87.091 kg) IBW/kg (Calculated) : 68.4  Temp (24hrs), Avg:98.1 F (36.7 C), Min:97.9 F (36.6 C), Max:98.3 F (36.8 C)   Recent Labs Lab 09/08/15 0330  09/08/15 2111 09/09/15 0214 09/09/15 1225 09/10/15 0700 09/10/15 1806 09/11/15 0205  WBC 25.9*  < > 21.1* 20.2*  --  15.8* 19.7* 18.5*  CREATININE 4.45*  --   --  3.54* 3.10* 2.39*  --  2.02*  < > = values in this interval not displayed.  Estimated Creatinine Clearance: 32.4 mL/min (by C-G formula based on Cr of 2.02).    Allergies  Allergen Reactions  . Decongestant  [Oxymetazoline]     Antimicrobials this admission: vanc 4/11>> 4/14  >> ctx 4/11>> 4/15  levaquin 4/11>> 4/14  4/19 Zosyn >>   Dose adjustments this admission: n/a  Microbiology results: 4/11 resp cx: ngF 4/11 rvp: neg  4/11 blood cx: ngtd  4/11 urine cx: 8k, insig  4/11 wound cx: no result  4/11 strep pneumo Ag: +  4/11 legionella: neg   Thank you for allowing pharmacy to be a part of this patient's care.  LINDEL VANASTEN 09/11/2015 7:33 AM

## 2015-09-11 NOTE — Progress Notes (Signed)
CSW spoke with pt wife and emailed bed offers  CSW will continue to follow  Domenica Reamer, [place] Social Worker [phone]

## 2015-09-11 NOTE — Consult Note (Signed)
[facility] Consult: 1:49 PM 09/11/2015  LOS: 8 days    Referring Provider: Dr Karleen Hampshire  Primary Care Physician:  Lelon Huh, MD in [facility] Primary Gastroenterologist:  Dr. Tiffany Kocher Cardiologist: Dr Nehemiah Massed at [facility] Neuro:  Dr Melrose Nakayama at [facility]    Reason for Consultation:  Abdominal pain, bleeding PR, colitis per CT   HPI: Vincent Mason is a 80 y.o. male.  CAD, CABG 1999.  HLD.  HTN.  Afib, on Xarelto. Hx SSS.  OSA, non-compliant with CPAP. Pulmonary htn.  CKD 3.  Hx seizures, started Trileptal 12/2014.  Takes oxycodone for chronic pain.  Restless leg syndrome. Previous c spine surgery. Takes prn Ranitidine for GERD.  03/2002, 07/2012 Colonoscopy.  "internal hemorrhoids, no polyps" per entered info. Pt says he has had a couple of colonoscopies and HP polyps in past.  Found in [facility] notes.   Admitted > 1 week ago after taking yet another fall and LOC with eyes rolling into back of head.  Required intubation due to acute resp failure, encephalopathy.    Previous episodes of staring spells and unresponsiveness.  Also many falls, where he loses his balance.  + blistering LE edema and leg pain.  Diarrhea and lower abdominal pain started after admission.  RN says the diarrhea is frequent, brown, small volumes and not bloody. Received Rocephin x 5 days, zosyn started today,  on IV vanc.   c diff ordered but not yet collected.   However perineum is macerated and broken down and some small  blood is coming from this.   No n/v.  Appetite fair PTA but now diminished.  Extensive bruises seen on exam.  BPs to 65/35.  Pulse in 50s.  Hgb ranging ~ 7.9, but 6.9 pm 4/16.  Stable/improved since 09/08/15 PRBC x 1.   Coagulopathy to 28/2.7 at admission, and previously normal coags 06/30/14.  xarelto on hold.  AST/ALT  to 53/35, t bili 1.3.  Normal alk phos.  Albumin low at 2.6.   + AKI, improving.  CT 4/11: without contrast. No acute findings to explain abd pain.  Consolidation in lung bases.  CT 4/18: nodular liver, suggesting cirrhosis.  Wall thickening in the right colon, or descending colon and sigmoid colon is associated with pericolonic edema/inflammation.  Imaging features suggest an infectious/inflammatory colitis. Given involvement of the right and left colon, ischemic colitis is considered a less likely possibility.  Hematoma at right hip.  Right nephrolithiasis.    Pt denies ETOH use or hx fatty liver.    Past Medical History  Diagnosis Date  . Hypertension   . Hyperlipidemia   . COPD (chronic obstructive pulmonary disease) ([place])   . Kidney disease, chronic, stage III (moderate, EGFR 30-59 ml/min)   . Atrial fibrillation and flutter ([place])     continue routine follow up with Dr. Nehemiah Massed    Past Surgical History  Procedure Laterality Date  . Cardiac surgery    . Hyperplastic colon polyp  02/2002    Sigmoid polyps  . Coronary artery bypass graft  10/1997  .  History of cervical discectomy      C5-C6    Prior to Admission medications   Medication Sig Start Date End Date Taking? Authorizing Provider  benazepril (LOTENSIN) 20 MG tablet Take 1 tablet by mouth daily. Reported on 07/08/2015 02/12/14  Yes Historical Provider, MD  Coenzyme Q10 (COQ10) 200 MG CAPS Take 1 capsule by mouth daily.   Yes Historical Provider, MD  colchicine 0.6 MG tablet Take 1 tablet (0.6 mg total) by mouth daily as needed (for gout). 08/23/15  Yes Birdie Sons, MD  diazepam (VALIUM) 5 MG tablet Take 1 tablet by mouth every 8 (eight) hours. Reported on 07/08/2015 08/11/11  Yes Historical Provider, MD  [place] 125 MCG tablet Take 1 tablet by mouth daily. 12/31/14  Yes Historical Provider, MD  Febuxostat (ULORIC) 80 MG TABS Take 1 tablet (80 mg total) by mouth daily. 08/20/15  Yes Birdie Sons, MD  meclizine (ANTIVERT) 25  MG tablet Take 1 tablet (25 mg total) by mouth 3 (three) times daily as needed for dizziness. 07/08/15  Yes Birdie Sons, MD  metolazone (ZAROXOLYN) 5 MG tablet Take 2 tablets by mouth daily. 01/12/15  Yes Historical Provider, MD  metoprolol succinate (TOPROL-XL) 100 MG 24 hr tablet TAKE ONE-HALF TABLET DAILY 03/31/15  Yes Birdie Sons, MD  OXcarbazepine (TRILEPTAL) 150 MG tablet Take 1 tablet (150 mg total) by mouth 2 (two) times daily. Prescribed by Dr. Melrose Nakayama 08/27/15  Yes Birdie Sons, MD  oxyCODONE (ROXICODONE) 15 MG immediate release tablet Take 1 tablet (15 mg total) by mouth every 4 (four) hours as needed for pain. 08/16/15  Yes Birdie Sons, MD  potassium chloride SA (K-DUR,KLOR-CON) 20 MEQ tablet Take 2 tablets (40 mEq total) by mouth 2 (two) times daily. 07/10/15  Yes Birdie Sons, MD  ranitidine (ZANTAC) 150 MG tablet Take 1 tablet by mouth 2 (two) times daily as needed. As needed for stomach burning 08/11/13  Yes Historical Provider, MD  rOPINIRole (REQUIP) 4 MG tablet TAKE ONE TABLET BY MOUTH EVERY NIGHT AT BEDTIME FOR RESTLESS LEG SYNDROME 07/15/15  Yes Birdie Sons, MD  rosuvastatin (CRESTOR) 40 MG tablet TAKE ONE (1) TABLET EACH DAY *GENERIC CRESTOR 04/30/15  Yes Birdie Sons, MD  sildenafil (REVATIO) 20 MG tablet Take 2.5 tablets (50 mg total) by mouth as needed. Prescribed by Dr. Nehemiah Massed 08/27/15  Yes Birdie Sons, MD  torsemide (DEMADEX) 20 MG tablet 2 tablets once or twice a day for swelling Patient taking differently: Take 40 mg by mouth 2 (two) times daily.  08/26/15  Yes Birdie Sons, MD  XARELTO 15 MG TABS tablet TAKE ONE TABLET BY MOUTH DAILY WITH EVENING MEAL 01/14/15  Yes Birdie Sons, MD  zolpidem (AMBIEN) 5 MG tablet Take 1 tablet by mouth at bedtime. Reported on 07/08/2015 04/19/12  Yes Historical Provider, MD    Scheduled Meds: . diazepam  2 mg Oral Q8H  . digoxin  0.0625 mg Oral Daily  . febuxostat  80 mg Oral Daily  . feeding supplement (ENSURE  ENLIVE)  237 mL Oral BID BM  . gabapentin  100 mg Oral BID  . OXcarbazepine  150 mg Oral BID  . pantoprazole  40 mg Oral BID  . piperacillin-tazobactam (ZOSYN)  IV  3.375 g Intravenous Q8H  . potassium chloride  60 mEq Oral BID  . rOPINIRole  2 mg Oral QHS  . [START ON 09/12/2015] vancomycin  750 mg Intravenous Q24H   Infusions: .  heparin 1,300 Units/hr (09/11/15 0250)   PRN Meds: colchicine, HYDROmorphone (DILAUDID) injection, oxyCODONE   Allergies as of 09/03/2015 - Review Complete 09/03/2015  Allergen Reaction Noted  . Decongestant  [oxymetazoline]  01/22/2015    No family history on file.  Social History   Social History  . Marital Status: Married    Spouse Name: N/A  . Number of Children: N/A  . Years of Education: some colle   Occupational History  . Retired    Social History Main Topics  . Smoking status: Former Research scientist (life sciences)  . Smokeless tobacco: Not on file     Comment: Quit in 1990  . Alcohol Use: No  . Drug Use: No  . Sexual Activity: Not on file   Other Topics Concern  . Not on file   Social History Narrative    REVIEW OF SYSTEMS: Constitutional:  Weakness, frequent falls ENT:  No nose bleeds Pulm:  No SOB or cough CV:  No palpitations, no LE edema.  GU:  No hematuria, no frequency GI:  Per HPI.   Heme:  Extensive bruising   Transfusions:  None in past per his recall  Neuro:  Burning leg pain.  Derm:  Chronic ulcers on shins.  Endocrine:  No sweats or chills.  No polyuria or dysuria Immunization:  Not queried Travel:  None beyond local counties in last few months.    PHYSICAL EXAM: Vital signs in last 24 hours: Filed Vitals:   09/10/15 1629 09/10/15 2125  BP: 128/56 148/67  Pulse: 78 85  Temp: 97.9 F (36.6 C) 98.3 F (36.8 C)  Resp: 16 18   Wt Readings from Last 3 Encounters:  09/11/15 87.091 kg (192 lb)  08/26/15 102.059 kg (225 lb)  08/05/15 94.348 kg (208 lb)    General: somewhat frail, uncomfortable WM.  Looks ill.  Head:  No  trauma, asymmetry or swelling  Eyes:  No icterus or pallor Ears:  Not HOH  Nose:  No discharge Mouth:  Teeth in good repair.  Somewhat dry oral mucosa, no lesions or exudates.  Neck:  No mass, JVD, TMG Lungs:  Clear bil.   Heart: Afib in 90s.  Irreg/irreg with soft murmer Abdomen:  Soft, active BS.  Bruising along lower abdomen around to flanks, low back, hips and thighs.  Tender in lower abdomen bil.  .   Rectal: brown liquid stool, trace FOBT +.  Skin erythematous and macerated in perirectal region, tender to touch.   Musc/Skeltl: no joint swelling, redness.  Extremities:  + edema Violaceous discoloaration and eschar covering large ulcers on legs.  Brawny/woody changes in LE Neurologic:  Moves all 4 limbs.  Some involuntary LE movement.  Fully alert.  Oriented x 3.   Skin:  Per extremity exam Tattoos:  none   Psych:  Agitated, anxious.   Intake/Output from previous day: 04/18 0701 - 04/19 0700 In: 581.9 [P.O.:360; I.V.:221.9] Out: -  Intake/Output this shift: Total I/O In: -  Out: 2100 [Urine:2100]  LAB RESULTS:  Recent Labs  09/10/15 0700 09/10/15 1806 09/11/15 0205  WBC 15.8* 19.7* 18.5*  HGB 7.7* 7.6* 7.6*  HCT 25.4* 24.1* 24.6*  PLT 166 153 167   BMET Lab Results  Component Value Date   NA 139 09/11/2015   NA 143 09/10/2015   NA 143 09/09/2015   K 3.5 09/11/2015   K 2.9* 09/10/2015   K 2.7* 09/09/2015   CL 107 09/11/2015   CL 106 09/10/2015   CL 103 09/09/2015  CO2 22 09/11/2015   CO2 25 09/10/2015   CO2 27 09/09/2015   GLUCOSE 110* 09/11/2015   GLUCOSE 143* 09/10/2015   GLUCOSE 121* 09/09/2015   BUN 59* 09/11/2015   BUN 75* 09/10/2015   BUN 97* 09/09/2015   CREATININE 2.02* 09/11/2015   CREATININE 2.39* 09/10/2015   CREATININE 3.10* 09/09/2015   CALCIUM 7.8* 09/11/2015   CALCIUM 8.2* 09/10/2015   CALCIUM 8.3* 09/09/2015   LFT  Recent Labs  09/09/15 0214 09/10/15 0700 09/11/15 0205  PROT 5.6* 5.7* 5.4*  ALBUMIN 2.6* 2.7* 2.6*  AST 45*  53* 42*  ALT 31 35 32  ALKPHOS 60 62 52  BILITOT 1.2 1.3* 1.3*   PT/INR Lab Results  Component Value Date   INR 2.05* 09/04/2015   INR 2.72* 09/03/2015   INR 1.1 06/30/2014    Drugs of Abuse     Component Value Date/Time   LABOPIA POSITIVE* 09/03/2015 0345   LABOPIA NONE DETECTED 01/17/2015 0804   COCAINSCRNUR NONE DETECTED 09/03/2015 0345   COCAINSCRNUR NONE DETECTED 01/17/2015 0804   LABBENZ NONE DETECTED 09/03/2015 0345   LABBENZ NONE DETECTED 01/17/2015 0804   AMPHETMU NONE DETECTED 09/03/2015 0345   AMPHETMU NONE DETECTED 01/17/2015 0804   THCU NONE DETECTED 09/03/2015 0345   THCU NONE DETECTED 01/17/2015 0804   LABBARB NONE DETECTED 09/03/2015 0345   LABBARB NONE DETECTED 01/17/2015 0804   2D echo:  09/04/15 - Left ventricle: The cavity size was normal. Wall thickness was  normal. Systolic function was normal. The estimated ejection  fraction was in the range of 60% to 65%. Wall motion was normal;  there were no regional wall motion abnormalities. - Mitral valve: There was mild regurgitation. - Right ventricle: The cavity size was mildly dilated. Systolic  function was mildly to moderately reduced. - Right atrium: The atrium was massively dilated. - Tricuspid valve: There was severe regurgitation. - Pulmonary arteries: Systolic pressure was moderately to severely  increased. PA peak pressure: 59 mm Hg (S).  RADIOLOGY STUDIES: inages viewed Gatha Mayer, MD, [facility]  Ct Abdomen Pelvis Wo Contrast  09/10/2015  CLINICAL DATA:  Altered mental status changes and abdominal pain. EXAM: CT CHEST, ABDOMEN AND PELVIS WITHOUT CONTRAST TECHNIQUE: Multidetector CT imaging of the chest, abdomen and pelvis was performed following the standard protocol without IV contrast. COMPARISON:  Abdomen and pelvis CT from 09/03/2015. FINDINGS: CT CHEST FINDINGS Mediastinum/Lymph Nodes: There is no axillary lymphadenopathy. No mediastinal lymphadenopathy. There is no hilar lymphadenopathy.  Heart is enlarged. Patient is status post CABG. The esophagus has normal imaging features. Right-sided central line tip is positioned in the mid SVC. Lungs/Pleura: There is some dependent mucus in the distal trachea. Peripheral nodularity is seen in the upper lobes bilaterally with a peribronchovascular distribution suggesting sequelae of atypical infection. There is central interstitial and alveolar ground-glass attenuation in the right middle lobe with ill-defined central nodularity in the lower lobes bilaterally having a peribronchovascular distribution. There is dependent patchy airspace disease in both lower lobes. No pleural effusion. No suspicious pulmonary mass. Musculoskeletal: Bone windows reveal no worrisome lytic or sclerotic osseous lesions. CT ABDOMEN PELVIS FINDINGS Hepatobiliary: Liver contour has a nodular configuration suggesting underlying cirrhosis. No focal lesion is evident in the liver parenchyma on this study performed without intravenous contrast material. No evidence for gallstones. No intrahepatic or extrahepatic biliary dilation. Pancreas: No focal mass lesion. No dilatation of the main duct. No intraparenchymal cyst. No peripancreatic edema. Spleen: No splenomegaly. No focal mass lesion. Adrenals/Urinary Tract: Right  adrenal gland is normal in appearance. 3.0 cm myelolipoma of the left adrenal gland noted. 5 mm nonobstructing stone is identified in the upper pole of the right kidney. 2 mm nonobstructing interpolar right kidney stone evident. No stones are identified in the left kidney. No evidence for hydroureter. Urinary bladder is decompressed and gas in the bladder is compatible with the presence of a Foley catheter. Stomach/Bowel: Stomach is nondistended. No gastric wall thickening. No evidence of outlet obstruction. Duodenum is normally positioned as is the ligament of Treitz. No small bowel wall thickening. No small bowel dilatation. The terminal ileum is normal. The appendix is  not visualized. There appears to be eccentric and irregular wall thickening in the ascending colon. Transverse colon has normal CT imaging features as does the splenic flexure. Mid descending colon shows circumferential wall thickening with ill-defined colonic wall margin and this appearance extends distally to the level of the rectum. There is pericolonic edema/inflammation around the right colon, left colon and sigmoid colon. Vascular/Lymphatic: There is abdominal aortic atherosclerosis without aneurysm. 13 mm left para-aortic short axis lymph node on image 73 series 201 is mildly enlarged. No other abdominal lymphadenopathy is evident. No evidence for pelvic sidewall lymphadenopathy. Reproductive: Dystrophic calcification is seen in the prostate gland. Other: Trace intraperitoneal free fluid is evident. Musculoskeletal: Bone windows reveal no worrisome lytic or sclerotic osseous lesions. Mixed attenuation fluid collection is identified superficial to the musculature of the right hip, suggesting hematoma. IMPRESSION: 1. Patchy bilateral airspace disease suggest bronchopneumonia. This is superimposed on probable sequelae of chronic atypical infection. 2. Wall thickening in the right colon, or descending colon and sigmoid colon is associated with pericolonic edema/inflammation. Imaging features suggest an infectious/inflammatory colitis. Given involvement of the right and left colon, ischemic colitis is considered a less likely possibility. 3. Nodular hepatic contour suggests cirrhosis. 4. Hematoma overlies the musculature of the right hip. 5. 3 cm left adrenal myelolipoma 6. Nonobstructing right nephrolithiasis. 7. Abdominal aortic atherosclerosis. Electronically Signed   By: Misty Stanley M.D.   On: 09/10/2015 19:30   Ct Chest Wo Contrast  09/10/2015  CLINICAL DATA:  Altered mental status changes and abdominal pain. EXAM: CT CHEST, ABDOMEN AND PELVIS WITHOUT CONTRAST TECHNIQUE: Multidetector CT imaging of the  chest, abdomen and pelvis was performed following the standard protocol without IV contrast. COMPARISON:  Abdomen and pelvis CT from 09/03/2015. FINDINGS: CT CHEST FINDINGS Mediastinum/Lymph Nodes: There is no axillary lymphadenopathy. No mediastinal lymphadenopathy. There is no hilar lymphadenopathy. Heart is enlarged. Patient is status post CABG. The esophagus has normal imaging features. Right-sided central line tip is positioned in the mid SVC. Lungs/Pleura: There is some dependent mucus in the distal trachea. Peripheral nodularity is seen in the upper lobes bilaterally with a peribronchovascular distribution suggesting sequelae of atypical infection. There is central interstitial and alveolar ground-glass attenuation in the right middle lobe with ill-defined central nodularity in the lower lobes bilaterally having a peribronchovascular distribution. There is dependent patchy airspace disease in both lower lobes. No pleural effusion. No suspicious pulmonary mass. Musculoskeletal: Bone windows reveal no worrisome lytic or sclerotic osseous lesions. CT ABDOMEN PELVIS FINDINGS Hepatobiliary: Liver contour has a nodular configuration suggesting underlying cirrhosis. No focal lesion is evident in the liver parenchyma on this study performed without intravenous contrast material. No evidence for gallstones. No intrahepatic or extrahepatic biliary dilation. Pancreas: No focal mass lesion. No dilatation of the main duct. No intraparenchymal cyst. No peripancreatic edema. Spleen: No splenomegaly. No focal mass lesion. Adrenals/Urinary Tract: Right adrenal  gland is normal in appearance. 3.0 cm myelolipoma of the left adrenal gland noted. 5 mm nonobstructing stone is identified in the upper pole of the right kidney. 2 mm nonobstructing interpolar right kidney stone evident. No stones are identified in the left kidney. No evidence for hydroureter. Urinary bladder is decompressed and gas in the bladder is compatible with the  presence of a Foley catheter. Stomach/Bowel: Stomach is nondistended. No gastric wall thickening. No evidence of outlet obstruction. Duodenum is normally positioned as is the ligament of Treitz. No small bowel wall thickening. No small bowel dilatation. The terminal ileum is normal. The appendix is not visualized. There appears to be eccentric and irregular wall thickening in the ascending colon. Transverse colon has normal CT imaging features as does the splenic flexure. Mid descending colon shows circumferential wall thickening with ill-defined colonic wall margin and this appearance extends distally to the level of the rectum. There is pericolonic edema/inflammation around the right colon, left colon and sigmoid colon. Vascular/Lymphatic: There is abdominal aortic atherosclerosis without aneurysm. 13 mm left para-aortic short axis lymph node on image 73 series 201 is mildly enlarged. No other abdominal lymphadenopathy is evident. No evidence for pelvic sidewall lymphadenopathy. Reproductive: Dystrophic calcification is seen in the prostate gland. Other: Trace intraperitoneal free fluid is evident. Musculoskeletal: Bone windows reveal no worrisome lytic or sclerotic osseous lesions. Mixed attenuation fluid collection is identified superficial to the musculature of the right hip, suggesting hematoma. IMPRESSION: 1. Patchy bilateral airspace disease suggest bronchopneumonia. This is superimposed on probable sequelae of chronic atypical infection. 2. Wall thickening in the right colon, or descending colon and sigmoid colon is associated with pericolonic edema/inflammation. Imaging features suggest an infectious/inflammatory colitis. Given involvement of the right and left colon, ischemic colitis is considered a less likely possibility. 3. Nodular hepatic contour suggests cirrhosis. 4. Hematoma overlies the musculature of the right hip. 5. 3 cm left adrenal myelolipoma 6. Nonobstructing right nephrolithiasis. 7.  Abdominal aortic atherosclerosis. Electronically Signed   By: Misty Stanley M.D.   On: 09/10/2015 19:30    ENDOSCOPIC STUDIES: Per HPI   IMPRESSION:   *  Colitis suggested by CT.  Not seen on initial CT really has thickened colonic walls of unclear sig   The blood seen is from severely macerated perirectum.  Flexisseal to be placed.  Pain in abdomen may be the colitis or it could be from extensive hematomas.  Current abx is only IV vanc and Zosyn.   *  Hx of multiple falls.   Has significant bruising all over flanks and mid/lower back, hips and legs.  Hip hematoma shows up on CT.    *  A fib, Xarelto at home.  On hold with Heparin drip in place.  Given his frequent falls and now, anemia, may be time to reassess risk/benefit here.    *  Coagulopathy. Present at time of admission (pre Heparin). Not present 2 months ago.  ? Due to xarleto ? Due to liver dysfunction.    *  Apparent cirrhosis by CT.  Check Hep ABC.  Pt says he is a non-drinker. No previous dx of liver disease.   *  Anemia due to extensive hematomas.   *  Seizure disorder.      PLAN:     *  Check Hep ABC serologies, AMA, ANA, PT/INR.  Await C diff.    *  Replace PPI with Pepcid.   *  Consider discontinuing IV Heparin.    Azucena Freed  09/11/2015,  1:49 PM Pager: [place] Attending   I have taken an interval history, reviewed the chart and examined the patient. I agree with the Advanced Practitioner's note, impression and recommendations.   Very complicated and sig ill elderly man who has thickening of the colon r and l on CT - I think this could be edema and related to mesenteric edema changes after IVF in setting of cirrhosis as one possibility and could be colitis, also. C diff did return ++  so has C diff colitis - vancomycin started - 125 mg dose is fine (adjusted)  Bleeding is not intestinal as above.  Good supportive care as you are Will f/u

## 2015-09-11 NOTE — Progress Notes (Signed)
[place] for Heparin Indication: atrial fibrillation  Allergies  Allergen Reactions  . Decongestant  [Oxymetazoline]     Patient Measurements: Height: 5\' 8"  (172.7 cm) Weight: 192 lb (87.091 kg) IBW/kg (Calculated) : 68.4  Vital Signs:    Labs:  Recent Labs  09/09/15 1225  09/10/15 0700 09/10/15 1806 09/10/15 2018 09/11/15 0205 09/11/15 1309  HGB  --   --  7.7* 7.6*  --  7.6*  --   HCT  --   --  25.4* 24.1*  --  24.6*  --   PLT  --   --  166 153  --  167  --   HEPARINUNFRC  --   < >  --  1.04* 1.96* 0.12* 0.21*  CREATININE 3.10*  --  2.39*  --   --  2.02*  --   < > = values in this interval not displayed.  Estimated Creatinine Clearance: 32.4 mL/min (by C-G formula based on Cr of 2.02).   Assessment: 80 yo male on heparin bridge for Afib while Xarelto on hold. Pt with extensive bruising due to fall but no bleeding; has hx recurrent falls. CHADS2VASC is 46 (age, HF, HTN)  HL subtherapeutic at 0.21 on 1300 units/hr. Events of last night noted. No bleeding noted, Hb low stable, platelets are normal.  Goal of Therapy:  Heparin level 0.3-0.7 units/ml Monitor platelets by anticoagulation protocol: Yes   Plan:  Increase heparin drip to 1450 units/hr 8 hr HL Daily HL and CBC Monitor for s/sx of bleeding  [facility], [place].D., BCPS Clinical Pharmacist Pager: [phone] 09/11/2015 2:28 PM

## 2015-09-11 NOTE — Progress Notes (Signed)
[place] for lovenox Indication: vte ppx  Allergies  Allergen Reactions  . Decongestant  [Oxymetazoline]     Patient Measurements: Height: 5\' 8"  (172.7 cm) Weight: 192 lb (87.091 kg) IBW/kg (Calculated) : 68.4  Vital Signs:    Labs:  Recent Labs  09/09/15 1225  09/10/15 0700 09/10/15 1806 09/10/15 2018 09/11/15 0205 09/11/15 1309  HGB  --   --  7.7* 7.6*  --  7.6*  --   HCT  --   --  25.4* 24.1*  --  24.6*  --   PLT  --   --  166 153  --  167  --   HEPARINUNFRC  --   < >  --  1.04* 1.96* 0.12* 0.21*  CREATININE 3.10*  --  2.39*  --   --  2.02*  --   < > = values in this interval not displayed.  Estimated Creatinine Clearance: 32.4 mL/min (by C-G formula based on Cr of 2.02).   Assessment: 80 yo male on heparin bridge for Afib while Xarelto on hold. Pt with extensive bruising due to fall but no bleeding; has hx recurrent falls. CHADS2VASC is 30 (age, HF, HTN)  Pharmacy now consulted to convert from heparin drip to lovenox for VTE prophylaxis. Will not resume Xarelto for now. Renal function improving, CrCl~32 today. Hg low stable, plt wnl. No bleeding currently per documentation.  Goal of Therapy:  VTE prophylaxis Monitor platelets by anticoagulation protocol: Yes   Plan:  D/c heparin drip >> lovenox 40mg  [place] Q24h Start lovenox 1 hour after heparin drip d/c'd Monitor CBC, s/sx bleeding  Elicia Lamp, PharmD, BCPS Clinical Pharmacist Pager [phone] 09/11/2015 5:02 PM

## 2015-09-12 DIAGNOSIS — A0472 Enterocolitis due to Clostridium difficile, not specified as recurrent: Secondary | ICD-10-CM | POA: Diagnosis present

## 2015-09-12 DIAGNOSIS — A419 Sepsis, unspecified organism: Secondary | ICD-10-CM

## 2015-09-12 DIAGNOSIS — I10 Essential (primary) hypertension: Secondary | ICD-10-CM | POA: Diagnosis present

## 2015-09-12 DIAGNOSIS — I482 Chronic atrial fibrillation, unspecified: Secondary | ICD-10-CM | POA: Diagnosis present

## 2015-09-12 DIAGNOSIS — K7460 Unspecified cirrhosis of liver: Secondary | ICD-10-CM

## 2015-09-12 DIAGNOSIS — R195 Other fecal abnormalities: Secondary | ICD-10-CM | POA: Diagnosis present

## 2015-09-12 DIAGNOSIS — J189 Pneumonia, unspecified organism: Secondary | ICD-10-CM | POA: Diagnosis present

## 2015-09-12 DIAGNOSIS — I5033 Acute on chronic diastolic (congestive) heart failure: Secondary | ICD-10-CM | POA: Diagnosis present

## 2015-09-12 DIAGNOSIS — R6521 Severe sepsis with septic shock: Secondary | ICD-10-CM

## 2015-09-12 DIAGNOSIS — N170 Acute kidney failure with tubular necrosis: Secondary | ICD-10-CM | POA: Diagnosis present

## 2015-09-12 DIAGNOSIS — K7689 Other specified diseases of liver: Secondary | ICD-10-CM | POA: Diagnosis present

## 2015-09-12 DIAGNOSIS — J9601 Acute respiratory failure with hypoxia: Secondary | ICD-10-CM | POA: Diagnosis present

## 2015-09-12 DIAGNOSIS — F1120 Opioid dependence, uncomplicated: Secondary | ICD-10-CM

## 2015-09-12 HISTORY — DX: Enterocolitis due to Clostridium difficile, not specified as recurrent: A04.72

## 2015-09-12 HISTORY — DX: Severe sepsis with septic shock: R65.21

## 2015-09-12 HISTORY — DX: Sepsis, unspecified organism: A41.9

## 2015-09-12 HISTORY — DX: Pneumonia, unspecified organism: J18.9

## 2015-09-12 LAB — COMPREHENSIVE METABOLIC PANEL
ALT: 30 U/L (ref 17–63)
AST: 31 U/L (ref 15–41)
Albumin: 2.7 g/dL — ABNORMAL LOW (ref 3.5–5.0)
Alkaline Phosphatase: 57 U/L (ref 38–126)
Anion gap: 12 (ref 5–15)
BUN: 41 mg/dL — ABNORMAL HIGH (ref 6–20)
CO2: 20 mmol/L — ABNORMAL LOW (ref 22–32)
Calcium: 8.1 mg/dL — ABNORMAL LOW (ref 8.9–10.3)
Chloride: 107 mmol/L (ref 101–111)
Creatinine, Ser: 2.03 mg/dL — ABNORMAL HIGH (ref 0.61–1.24)
GFR calc Af Amer: 34 mL/min — ABNORMAL LOW (ref >60)
GFR calc non Af Amer: 30 mL/min — ABNORMAL LOW (ref >60)
Glucose, Bld: 133 mg/dL — ABNORMAL HIGH (ref 65–99)
Potassium: 3.3 mmol/L — ABNORMAL LOW (ref 3.5–5.1)
Sodium: 139 mmol/L (ref 135–145)
Total Bilirubin: 1.1 mg/dL (ref 0.3–1.2)
Total Protein: 5.8 g/dL — ABNORMAL LOW (ref 6.5–8.1)

## 2015-09-12 LAB — CBC WITH DIFFERENTIAL/PLATELET
Basophils Absolute: 0 10*3/uL (ref 0.0–0.1)
Basophils Relative: 0 %
Eosinophils Absolute: 0.3 10*3/uL (ref 0.0–0.7)
Eosinophils Relative: 1 %
HCT: 27.3 % — ABNORMAL LOW (ref 39.0–52.0)
Hemoglobin: 8.2 g/dL — ABNORMAL LOW (ref 13.0–17.0)
Lymphocytes Relative: 3 %
Lymphs Abs: 0.8 10*3/uL (ref 0.7–4.0)
MCH: 27.5 pg (ref 26.0–34.0)
MCHC: 30 g/dL (ref 30.0–36.0)
MCV: 91.6 fL (ref 78.0–100.0)
Monocytes Absolute: 0.7 10*3/uL (ref 0.1–1.0)
Monocytes Relative: 3 %
Neutro Abs: 24 10*3/uL — ABNORMAL HIGH (ref 1.7–7.7)
Neutrophils Relative %: 93 %
Platelets: 212 10*3/uL (ref 150–400)
RBC: 2.98 MIL/uL — ABNORMAL LOW (ref 4.22–5.81)
RDW: 20.1 % — ABNORMAL HIGH (ref 11.5–15.5)
WBC: 25.8 10*3/uL — ABNORMAL HIGH (ref 4.0–10.5)

## 2015-09-12 MED ORDER — ROSUVASTATIN CALCIUM 20 MG PO TABS
40.0000 mg | ORAL_TABLET | Freq: Every day | ORAL | Status: DC
  Administered 2015-09-12 – 2015-09-16 (×5): 40 mg via ORAL
  Filled 2015-09-12 (×5): qty 2

## 2015-09-12 MED ORDER — IPRATROPIUM-ALBUTEROL 0.5-2.5 (3) MG/3ML IN SOLN
3.0000 mL | RESPIRATORY_TRACT | Status: DC | PRN
  Administered 2015-09-14 – 2015-09-15 (×3): 3 mL via RESPIRATORY_TRACT
  Filled 2015-09-12 (×3): qty 3

## 2015-09-12 MED ORDER — IPRATROPIUM BROMIDE 0.02 % IN SOLN
0.5000 mg | Freq: Once | RESPIRATORY_TRACT | Status: AC
  Administered 2015-09-12: 0.5 mg via RESPIRATORY_TRACT
  Filled 2015-09-12: qty 2.5

## 2015-09-12 MED ORDER — FUROSEMIDE 10 MG/ML IJ SOLN
40.0000 mg | Freq: Once | INTRAMUSCULAR | Status: AC
  Administered 2015-09-12: 40 mg via INTRAVENOUS
  Filled 2015-09-12: qty 4

## 2015-09-12 MED ORDER — ALBUTEROL SULFATE (2.5 MG/3ML) 0.083% IN NEBU
5.0000 mg | INHALATION_SOLUTION | Freq: Once | RESPIRATORY_TRACT | Status: AC
  Administered 2015-09-12: 5 mg via RESPIRATORY_TRACT
  Filled 2015-09-12: qty 6

## 2015-09-12 NOTE — Progress Notes (Signed)
Pt demanded removal of flexi-seal so he could use the BSC. After over an hour on the commode, he is refusing to go back to bed. Pt has been educated on the risks of getting up without assistance. Call bell is within reach. Will continue to monitor.

## 2015-09-12 NOTE — Progress Notes (Signed)
Patient c/o cough and SOB with audible crackles heard. MD notified. Will continue to evaluate and treat per orders.

## 2015-09-12 NOTE — Progress Notes (Signed)
Progress Note    VARDAAN LEIDIG  [id number] DOB: [birth year]  DOA: 09/03/2015 PCP: Lelon Huh, MD   Outpatient Specialists:   None.   Brief Narrative:   Vincent Mason is an 80 y.o. male with a PMH of atrial fibrillation on Xarelto, chronic diastolic heart failure, pulmonary HTN, COPD unknown Stage, CKD Stage III, possible history of seizures who was admitted 09/03/15 after being found unresponsive at home. EMS reported he had unequal pupils with left larger than right. He was intubated on arrival to the ED due to acute respiratory failure. Wife reports several episodes of staring spells and several episodes of unresponsiveness that have lasted 5-10 minutes the past several months. He was admitted 12/2014 with similar presentation and thought to be having seizures at that time and started on Trileptal with neurology follow up. Patient transferred from [facility] on 09/09/15, continued to have persistent leukocytosis, abdominal pain. CT scan of the chest of the abdomen revealed patchy bilateral airspace disease and possible right and left ischemic colitis. Patient now has profound diarrhea, started on antibiotics for colitis, rule out C. difficile, GI consultation requested and pending at this time.  Assessment/Plan:   Principal Problem:   Acute respiratory failure with hypoxia (HCC) secondary to acute septic encephalopathy / HCAP Presented after being found unresponsive at home in acute respiratory failure, initially suspicious for possible seizure event in the setting of chronic opiate/benzodiazepine dependency. Intubated on admission. CT of the head negative for acute bleed. Neurology saw the patient consultation and felt presentation most consistent with septic encephalopathy. Extubated 09/05/15. Although initial chest x-ray showed significant infiltrates, these appeared to improve with diuresis and it was unclear if his presenting shock was from sepsis secondary to pneumonia. He did complete  treatment with Levaquin and vancomycin for presumed aspiration pneumonia. He underwent a CT scan of the chest on 09/10/15 which showed patchy bilateral airspace disease suggestive of bronchopneumonia and antibiotics were reinitiated with vancomycin and Zosyn to cover healthcare associated pathogens. Antibiotics were subsequently discontinued 09/11/15 and setting of C. difficile infection. Would continue to hold antibiotics as the patient does not appear to have any significant symptoms related to pneumonia.  Severe sepsis with Septic shock Required pressor support with stabilization of blood pressure on broad-spectrum antibiotics. Pressors were weaned by 09/07/15. No evidence of adrenal insufficiency. Source suspected to be from aspiration pneumonia versus UTI. Blood cultures and sputum cultures negative.  Active Problems:   Seizures (Time) EEG done 09/03/15, no seizure focus identified.    Hyperlipidemia Crestor currently on hold. Has had transaminitis while in the hospital. LFTs now normalized. Resume Crestor.    Coronary artery disease Resume statin.    Acute on chronic renal failure/Chronic kidney disease (CKD), stage III (moderate)/acute tubular necrosis Had significant deterioration of renal function in the setting of hypotension and shock. Creatinine has improved from 5.25---> 2.02.    COPD (chronic obstructive pulmonary disease) (HCC) Stable.    Normocytic anemia/fecal occult blood test positive Thought to be from severely macerated perirectum, and multiple hematomas including a hip hematoma seen on CT scanning.    Opioid and benzodiazepine dependence (HCC) Currently on Valium 2 mg every 8 hours. Also has opiates ordered as needed.    Pulmonary arterial hypertension (HCC)    Status post peripherally inserted central catheter (PICC) central line placement    C. difficile colitis/abdominal pain Developed significant diarrhea and abdominal pain in the setting of recent broad-spectrum  antibiotics. C. difficile testing sent and returned positive  for/19/17. Placed on oral vancomycin.    Acute on chronic diastolic CHF (congestive heart failure), NYHA class 1 (HCC) Chest x-ray on 09/03/15 showed worsening changes of CHF with interstitial and alveolar edema. Diuresed. 2-D echo repeated 09/04/15 and showed EF 60-65 percent with moderate to severe pulmonary hypertension.    Chronic atrial fibrillation (HCC) Xarelto held on admission and the patient was treated with IV heparin. Heparin was subsequently discontinued in the setting of significant anemia and fecal occult positive blood in the stools. Given history of falls and coagulopathy, will need to have a discussion with the patient and his family regarding risk versus benefit of ongoing anticoagulation.    Essential hypertension Blood pressure stable off antihypertensives.    Apparent cirrhosis by CT GI in the process of evaluating with serologies.    Family Communication/Anticipated D/C date and plan/Code Status   DVT prophylaxis: Lovenox ordered. Code Status: Full Code.  Family Communication: No family at the bedside. Disposition Plan: CIR consult.   Medical Consultants:    Gastroenterology  Neurology  Pulmonology   Procedures:    Intubation  EEG  2-D echo  Anti-Infectives:    Levaquin 4/11-4/16  Vancomycin 4/11-4/14  Restarted vanc and zosyn 4/19 but stopped on the same day  Oral vancomycin 4/19  Subjective:   BROOKER DAHMEN reports some lower abdominal pain.  No diarrhea so far today.  Appetite fair.  No N/V.  Objective:    Filed Vitals:   09/11/15 0547 09/11/15 2057 09/12/15 0630 09/12/15 0641  BP:  125/45  122/56  Pulse:  93  73  Temp:  98.8 F (37.1 C)  97.3 F (36.3 C)  TempSrc:  Oral    Resp:  18  18  Height:      Weight: 87.091 kg (192 lb)  90.13 kg (198 lb 11.2 oz)   SpO2:  97%  98%    Intake/Output Summary (Last 24 hours) at 09/12/15 1453 Last data filed at 09/12/15  1400  Gross per 24 hour  Intake 1353.18 ml  Output   1300 ml  Net  53.18 ml   Filed Weights   09/10/15 0500 09/11/15 0547 09/12/15 0630  Weight: 87.2 kg (192 lb 3.9 oz) 87.091 kg (192 lb) 90.13 kg (198 lb 11.2 oz)    Exam: General exam: Appears calm and comfortable with right IJ in place.  Respiratory system: A few expiratory wheezes. Respiratory effort normal. Cardiovascular system: S1 & S2 heard, slightly irregular. No JVD, murmurs, rubs, gallops or clicks. + pedal edema. Gastrointestinal system: Abdomen is nondistended, soft and nontender. No organomegaly or masses felt. Normal bowel sounds heard. Central nervous system: Alert and oriented to self and place. No focal neurological deficits. Extremities: Symmetric 4+ x 5 power. No clubbing or cyanosis. Skin: Venous stasis dermatitis with ulcerations/wounds to lower extremity. Psychiatry: Judgement and insight appear normal. Mood & affect appropriate.   Data Reviewed:   I have personally reviewed following labs and imaging studies:  Labs: Basic Metabolic Panel:  Recent Labs Lab 09/06/15 0426  09/06/15 1810 09/07/15 0430 09/08/15 0330 09/09/15 0214 09/09/15 1225 09/10/15 0700 09/11/15 0205 09/12/15 0359  NA 139  < >  --  141 141 141 143 143 139 139  K 3.2*  < >  --  2.8* 2.8* 2.5* 2.7* 2.9* 3.5 3.3*  CL 95*  < >  --  93* 97* 100* 103 106 107 107  CO2 25  < >  --  29 29 29 27 25 22  20*  GLUCOSE 117*  < >  --  129* 130* 108* 121* 143* 110* 133*  BUN 163*  < >  --  159* 144* 111* 97* 75* 59* 41*  CREATININE 5.23*  < >  --  5.22* 4.45* 3.54* 3.10* 2.39* 2.02* 2.03*  CALCIUM 8.2*  < >  --  8.4* 8.0* 8.0* 8.3* 8.2* 7.8* 8.1*  MG 2.1  --  1.9 2.1 2.1  --  2.1  --   --   --   PHOS 7.2*  --   --   --  4.0  --   --   --   --   --   < > = values in this interval not displayed. GFR Estimated Creatinine Clearance: 32.7 mL/min (by C-G formula based on Cr of 2.03). Liver Function Tests:  Recent Labs Lab 09/09/15 0214  09/10/15 0700 09/11/15 0205 09/12/15 0359  AST 45* 53* 42* 31  ALT 31 35 32 30  ALKPHOS 60 62 52 57  BILITOT 1.2 1.3* 1.3* 1.1  PROT 5.6* 5.7* 5.4* 5.8*  ALBUMIN 2.6* 2.7* 2.6* 2.7*   Coagulation profile  Recent Labs Lab 09/11/15 1910  INR 1.46    CBC:  Recent Labs Lab 09/10/15 0700 09/10/15 1806 09/11/15 0205 09/11/15 1910 09/12/15 0359  WBC 15.8* 19.7* 18.5* 23.0* 25.8*  NEUTROABS  --   --   --   --  24.0*  HGB 7.7* 7.6* 7.6* 7.5* 8.2*  HCT 25.4* 24.1* 24.6* 24.6* 27.3*  MCV 90.1 89.9 90.1 90.8 91.6  PLT 166 153 167 175 212   CBG:  Recent Labs Lab 09/08/15 0419 09/08/15 1252 09/08/15 1638 09/08/15 1959 09/09/15 0003  GLUCAP 135* 126* 149* 171* 109*   Sepsis Labs:  Recent Labs Lab 09/10/15 1806 09/11/15 0205 09/11/15 1910 09/12/15 0359  WBC 19.7* 18.5* 23.0* 25.8*   Urine analysis:    Component Value Date/Time   COLORURINE YELLOW 09/03/2015 0346   COLORURINE Yellow 06/30/2014 0729   APPEARANCEUR TURBID* 09/03/2015 0346   APPEARANCEUR Clear 06/30/2014 0729   LABSPEC 1.013 09/03/2015 0346   LABSPEC 1.016 06/30/2014 0729   PHURINE 5.5 09/03/2015 0346   PHURINE 7.0 06/30/2014 0729   GLUCOSEU NEGATIVE 09/03/2015 0346   GLUCOSEU Negative 06/30/2014 0729   HGBUR MODERATE* 09/03/2015 0346   HGBUR 1+ 06/30/2014 0729   BILIRUBINUR NEGATIVE 09/03/2015 0346   BILIRUBINUR Negative 06/30/2014 0729   KETONESUR NEGATIVE 09/03/2015 0346   KETONESUR Negative 06/30/2014 0729   PROTEINUR >300* 09/03/2015 0346   PROTEINUR 30 mg/dL 06/30/2014 0729   NITRITE NEGATIVE 09/03/2015 0346   NITRITE Negative 06/30/2014 0729   LEUKOCYTESUR SMALL* 09/03/2015 0346   LEUKOCYTESUR Negative 06/30/2014 0729   Microbiology Recent Results (from the past 240 hour(s))  Culture, Urine     Status: Abnormal   Collection Time: 09/03/15  5:10 AM  Result Value Ref Range Status   Specimen Description URINE, CATHETERIZED  Final   Special Requests Normal  Final   Culture 8,000  COLONIES/mL INSIGNIFICANT GROWTH (A)  Final   Report Status 09/04/2015 FINAL  Final  Culture, blood (routine x 2)     Status: None   Collection Time: 09/03/15  7:00 AM  Result Value Ref Range Status   Specimen Description BLOOD RIGHT ARM  Final   Special Requests   Final    BOTTLES DRAWN AEROBIC AND ANAEROBIC 10CC IN AEB 5CC IN ANA   Culture NO GROWTH 5 DAYS  Final   Report Status 09/08/2015 FINAL  Final  MRSA PCR Screening     Status: None   Collection Time: 09/03/15  7:11 AM  Result Value Ref Range Status   MRSA by PCR NEGATIVE NEGATIVE Final    Comment:        The GeneXpert MRSA Assay (FDA approved for NASAL specimens only), is one component of a comprehensive MRSA colonization surveillance program. It is not intended to diagnose MRSA infection nor to guide or monitor treatment for MRSA infections.   Culture, blood (routine x 2)     Status: None   Collection Time: 09/03/15  7:15 AM  Result Value Ref Range Status   Specimen Description BLOOD RIGHT HAND  Final   Special Requests IN PEDIATRIC BOTTLE  3CC  Final   Culture NO GROWTH 5 DAYS  Final   Report Status 09/08/2015 FINAL  Final  Culture, respiratory (NON-Expectorated)     Status: None   Collection Time: 09/03/15  7:40 AM  Result Value Ref Range Status   Specimen Description TRACHEAL ASPIRATE  Final   Special Requests Normal  Final   Gram Stain   Final    FEW WBC PRESENT,BOTH PMN AND MONONUCLEAR MODERATE GRAM POSITIVE COCCI IN CHAINS Performed at [facility]    Culture   Final    NORMAL OROPHARYNGEAL FLORA Performed at [facility]    Report Status 09/05/2015 FINAL  Final  Respiratory virus panel     Status: None   Collection Time: 09/03/15  6:21 PM  Result Value Ref Range Status   Source - RVPAN NASAL SWAB  Corrected   Respiratory Syncytial Virus A Negative Negative Final   Respiratory Syncytial Virus B Negative Negative Final   Influenza A Negative Negative Final   Influenza B Negative  Negative Final   Parainfluenza 1 Negative Negative Final   Parainfluenza 2 Negative Negative Final   Parainfluenza 3 Negative Negative Final   Metapneumovirus Negative Negative Final   Rhinovirus Negative Negative Final   Adenovirus Negative Negative Final    Comment: (NOTE) Performed At: [facility] [place], [place] [id number] Lindon Romp MD [id number]   C difficile quick scan w PCR reflex     Status: Abnormal   Collection Time: 09/11/15  3:36 PM  Result Value Ref Range Status   C Diff antigen POSITIVE (A) NEGATIVE Final   C Diff toxin POSITIVE (A) NEGATIVE Final   C Diff interpretation Positive for toxigenic C. difficile  Final    Comment: CRITICAL RESULT CALLED TO, READ BACK BY AND VERIFIED WITH: GLOSSON RN 16:30 09/11/15 ([email or web address])     Radiology: Ct Abdomen Pelvis Wo Contrast  09/10/2015  CLINICAL DATA:  Altered mental status changes and abdominal pain. EXAM: CT CHEST, ABDOMEN AND PELVIS WITHOUT CONTRAST TECHNIQUE: Multidetector CT imaging of the chest, abdomen and pelvis was performed following the standard protocol without IV contrast. COMPARISON:  Abdomen and pelvis CT from 09/03/2015. FINDINGS: CT CHEST FINDINGS Mediastinum/Lymph Nodes: There is no axillary lymphadenopathy. No mediastinal lymphadenopathy. There is no hilar lymphadenopathy. Heart is enlarged. Patient is status post CABG. The esophagus has normal imaging features. Right-sided central line tip is positioned in the mid SVC. Lungs/Pleura: There is some dependent mucus in the distal trachea. Peripheral nodularity is seen in the upper lobes bilaterally with a peribronchovascular distribution suggesting sequelae of atypical infection. There is central interstitial and alveolar ground-glass attenuation in the right middle lobe with ill-defined central nodularity in the lower lobes bilaterally having a peribronchovascular distribution. There is dependent patchy airspace  disease in both lower  lobes. No pleural effusion. No suspicious pulmonary mass. Musculoskeletal: Bone windows reveal no worrisome lytic or sclerotic osseous lesions. CT ABDOMEN PELVIS FINDINGS Hepatobiliary: Liver contour has a nodular configuration suggesting underlying cirrhosis. No focal lesion is evident in the liver parenchyma on this study performed without intravenous contrast material. No evidence for gallstones. No intrahepatic or extrahepatic biliary dilation. Pancreas: No focal mass lesion. No dilatation of the main duct. No intraparenchymal cyst. No peripancreatic edema. Spleen: No splenomegaly. No focal mass lesion. Adrenals/Urinary Tract: Right adrenal gland is normal in appearance. 3.0 cm myelolipoma of the left adrenal gland noted. 5 mm nonobstructing stone is identified in the upper pole of the right kidney. 2 mm nonobstructing interpolar right kidney stone evident. No stones are identified in the left kidney. No evidence for hydroureter. Urinary bladder is decompressed and gas in the bladder is compatible with the presence of a Foley catheter. Stomach/Bowel: Stomach is nondistended. No gastric wall thickening. No evidence of outlet obstruction. Duodenum is normally positioned as is the ligament of Treitz. No small bowel wall thickening. No small bowel dilatation. The terminal ileum is normal. The appendix is not visualized. There appears to be eccentric and irregular wall thickening in the ascending colon. Transverse colon has normal CT imaging features as does the splenic flexure. Mid descending colon shows circumferential wall thickening with ill-defined colonic wall margin and this appearance extends distally to the level of the rectum. There is pericolonic edema/inflammation around the right colon, left colon and sigmoid colon. Vascular/Lymphatic: There is abdominal aortic atherosclerosis without aneurysm. 13 mm left para-aortic short axis lymph node on image 73 series 201 is mildly enlarged. No other abdominal  lymphadenopathy is evident. No evidence for pelvic sidewall lymphadenopathy. Reproductive: Dystrophic calcification is seen in the prostate gland. Other: Trace intraperitoneal free fluid is evident. Musculoskeletal: Bone windows reveal no worrisome lytic or sclerotic osseous lesions. Mixed attenuation fluid collection is identified superficial to the musculature of the right hip, suggesting hematoma. IMPRESSION: 1. Patchy bilateral airspace disease suggest bronchopneumonia. This is superimposed on probable sequelae of chronic atypical infection. 2. Wall thickening in the right colon, or descending colon and sigmoid colon is associated with pericolonic edema/inflammation. Imaging features suggest an infectious/inflammatory colitis. Given involvement of the right and left colon, ischemic colitis is considered a less likely possibility. 3. Nodular hepatic contour suggests cirrhosis. 4. Hematoma overlies the musculature of the right hip. 5. 3 cm left adrenal myelolipoma 6. Nonobstructing right nephrolithiasis. 7. Abdominal aortic atherosclerosis. Electronically Signed   By: Misty Stanley M.D.   On: 09/10/2015 19:30   Ct Chest Wo Contrast  09/10/2015  CLINICAL DATA:  Altered mental status changes and abdominal pain. EXAM: CT CHEST, ABDOMEN AND PELVIS WITHOUT CONTRAST TECHNIQUE: Multidetector CT imaging of the chest, abdomen and pelvis was performed following the standard protocol without IV contrast. COMPARISON:  Abdomen and pelvis CT from 09/03/2015. FINDINGS: CT CHEST FINDINGS Mediastinum/Lymph Nodes: There is no axillary lymphadenopathy. No mediastinal lymphadenopathy. There is no hilar lymphadenopathy. Heart is enlarged. Patient is status post CABG. The esophagus has normal imaging features. Right-sided central line tip is positioned in the mid SVC. Lungs/Pleura: There is some dependent mucus in the distal trachea. Peripheral nodularity is seen in the upper lobes bilaterally with a peribronchovascular distribution  suggesting sequelae of atypical infection. There is central interstitial and alveolar ground-glass attenuation in the right middle lobe with ill-defined central nodularity in the lower lobes bilaterally having a peribronchovascular distribution. There is dependent patchy airspace  disease in both lower lobes. No pleural effusion. No suspicious pulmonary mass. Musculoskeletal: Bone windows reveal no worrisome lytic or sclerotic osseous lesions. CT ABDOMEN PELVIS FINDINGS Hepatobiliary: Liver contour has a nodular configuration suggesting underlying cirrhosis. No focal lesion is evident in the liver parenchyma on this study performed without intravenous contrast material. No evidence for gallstones. No intrahepatic or extrahepatic biliary dilation. Pancreas: No focal mass lesion. No dilatation of the main duct. No intraparenchymal cyst. No peripancreatic edema. Spleen: No splenomegaly. No focal mass lesion. Adrenals/Urinary Tract: Right adrenal gland is normal in appearance. 3.0 cm myelolipoma of the left adrenal gland noted. 5 mm nonobstructing stone is identified in the upper pole of the right kidney. 2 mm nonobstructing interpolar right kidney stone evident. No stones are identified in the left kidney. No evidence for hydroureter. Urinary bladder is decompressed and gas in the bladder is compatible with the presence of a Foley catheter. Stomach/Bowel: Stomach is nondistended. No gastric wall thickening. No evidence of outlet obstruction. Duodenum is normally positioned as is the ligament of Treitz. No small bowel wall thickening. No small bowel dilatation. The terminal ileum is normal. The appendix is not visualized. There appears to be eccentric and irregular wall thickening in the ascending colon. Transverse colon has normal CT imaging features as does the splenic flexure. Mid descending colon shows circumferential wall thickening with ill-defined colonic wall margin and this appearance extends distally to the  level of the rectum. There is pericolonic edema/inflammation around the right colon, left colon and sigmoid colon. Vascular/Lymphatic: There is abdominal aortic atherosclerosis without aneurysm. 13 mm left para-aortic short axis lymph node on image 73 series 201 is mildly enlarged. No other abdominal lymphadenopathy is evident. No evidence for pelvic sidewall lymphadenopathy. Reproductive: Dystrophic calcification is seen in the prostate gland. Other: Trace intraperitoneal free fluid is evident. Musculoskeletal: Bone windows reveal no worrisome lytic or sclerotic osseous lesions. Mixed attenuation fluid collection is identified superficial to the musculature of the right hip, suggesting hematoma. IMPRESSION: 1. Patchy bilateral airspace disease suggest bronchopneumonia. This is superimposed on probable sequelae of chronic atypical infection. 2. Wall thickening in the right colon, or descending colon and sigmoid colon is associated with pericolonic edema/inflammation. Imaging features suggest an infectious/inflammatory colitis. Given involvement of the right and left colon, ischemic colitis is considered a less likely possibility. 3. Nodular hepatic contour suggests cirrhosis. 4. Hematoma overlies the musculature of the right hip. 5. 3 cm left adrenal myelolipoma 6. Nonobstructing right nephrolithiasis. 7. Abdominal aortic atherosclerosis. Electronically Signed   By: Misty Stanley M.D.   On: 09/10/2015 19:30    Medications:   . diazepam  2 mg Oral Q8H  . digoxin  0.0625 mg Oral Daily  . enoxaparin (LOVENOX) injection  40 mg Subcutaneous Q24H  . famotidine  20 mg Oral Daily  . febuxostat  80 mg Oral Daily  . feeding supplement (ENSURE ENLIVE)  237 mL Oral BID BM  . gabapentin  100 mg Oral BID  . metronidazole  500 mg Intravenous Q8H  . OXcarbazepine  150 mg Oral BID  . potassium chloride  60 mEq Oral BID  . rOPINIRole  2 mg Oral QHS  . saccharomyces boulardii  250 mg Oral BID  . sodium chloride flush   10-40 mL Intracatheter Q12H  . vancomycin  125 mg Oral Q6H   Continuous Infusions:   Time spent: 35 minutes.  The patient is medically complex with multiple co-morbidities and is at high risk for clinical deterioration and requires high  complexity decision making.    LOS: 9 days   [facility] Pager [phone]. If unable to reach me by pager, please call my cell phone at [phone].  *Please refer to [email or web address], password [email or web address] to get updated schedule on who will round on this patient, as hospitalists switch teams weekly. If 7PM-7AM, please contact night-coverage at [email or web address], password [email or web address] for any overnight needs.  09/12/2015, 2:53 PM

## 2015-09-12 NOTE — Progress Notes (Signed)
Thank for consult on Vincent Mason. Chart reviewed and note that he with history of gait disorder with multiple falls, confusion and participation in therapy continues to be limited. Agree with recommendations of SNF level therapies after discharge.

## 2015-09-12 NOTE — Progress Notes (Signed)
Nutrition Follow-up  DOCUMENTATION CODES:   Not applicable  INTERVENTION:   -Continue Ensure Enlive po BID, each supplement provides 350 kcal and 20 grams of protein  NUTRITION DIAGNOSIS:   Inadequate oral intake related to poor appetite as evidenced by meal completion < 50%.  Progressing  GOAL:   Patient will meet greater than or equal to 90% of their needs  Progressing  MONITOR:   PO intake, Supplement acceptance, Labs, Weight trends, I & O's  REASON FOR ASSESSMENT:   Consult Enteral/tube feeding initiation and management  ASSESSMENT:   Pt with a past medical history of A. Fib on Xarelto, Chronic Diastolic Heart Failure, Pulmonary HTN, COPD Unknown Stage, CKD Stage III, possible history of seizures presents to the ED after being found unresponsive at home.  Pt transferred from ICU to medical floor on 09/10/15.   Rectal tube removed on 09/12/15. Per GI notes, loose stools have improved.   Pt sleeping soundly at time of visit; RD did not wake. Noted open bottle of Ensure at bedside. Meal completion variable; PO: 25-100%.   CSW following. Therapy recommending SNF placement.   Labs reviewed: K: 3.3.   Diet Order:  Diet regular Room service appropriate?: Yes; Fluid consistency:: Thin  Skin:  Wound (see comment) (Patchy areas of partial thickness stasis ulcers to bilat anterior calves)  Last BM:  09/12/15  Height:   Ht Readings from Last 1 Encounters:  09/03/15 5\' 8"  (1.727 m)    Weight:   Wt Readings from Last 1 Encounters:  09/12/15 198 lb 11.2 oz (90.13 kg)    Ideal Body Weight:  70 kg  BMI:  Body mass index is 30.22 kg/(m^2).  Estimated Nutritional Needs:   Kcal:  1800-2000  Protein:  90-100 gm  Fluid:  1.8-2.0 L  EDUCATION NEEDS:   No education needs identified at this time  Gumecindo Hopkin A. Jimmye Norman, RD, LDN, CDE Pager: [phone] After hours Pager: [phone]

## 2015-09-12 NOTE — Progress Notes (Signed)
Physical Therapy Treatment Patient Details Name: Vincent Mason MRN: [id number] DOB: Jun 01, 1935 Today's Date: 09/12/2015    History of Present Illness pt presents with Encephalopathy, Respiratory Failure, and Renal Failure.  pt intubated 4/11-4/13.  pt with hx of A-fib, HF, Pulmonary HTN, COPD, CKD, and Seizures.      PT Comments    Vincent Mason requires cues to remain on task as he continues to be distracted by his bowels.  He currently requires up to min +2 assist for safe transfers and min assist for safe short distance ambulation in room using RW.     Follow Up Recommendations  SNF;Supervision/Assistance - 24 hour     Equipment Recommendations  Rolling walker with 5" wheels;3in1 (PT)    Recommendations for Other Services       Precautions / Restrictions Precautions Precautions: Fall Restrictions Weight Bearing Restrictions: No    Mobility  Bed Mobility Overal bed mobility: Needs Assistance Bed Mobility: Supine to Sit     Supine to sit: HOB elevated;Min guard     General bed mobility comments: Increased time and effort, using bed rail to pull up to sitting w/ HOB elevated.   Transfers Overall transfer level: Needs assistance Equipment used: 2 person hand held assist;Rolling walker (2 wheeled) Transfers: Sit to/from [facility] Sit to Stand: +2 physical assistance;Min assist Stand pivot transfers: +2 physical assistance;+2 safety/equipment;Min assist       General transfer comment: Pt slow to stand and does not reach full upright, requiring min +2 assist to steady as he pivots to [facility].  Pt distracted by his bowels and following cues inconsistently.  Pt requires min +1 assist to steady when standing from [facility] using RW w/ cues for proper hand placement.  Ambulation/Gait Ambulation/Gait assistance: Min assist Ambulation Distance (Feet): 40 Feet Assistive device: Rolling walker (2 wheeled) Gait Pattern/deviations: Step-through pattern;Decreased stride  length;Trunk flexed   Gait velocity interpretation: Below normal speed for age/gender General Gait Details: Cues for upright posture as pt demonstrates trunk flexion.  Requires min assist at times for management of RW, bumping into objects in room.   Stairs            Wheelchair Mobility    Modified Rankin (Stroke Patients Only)       Balance Overall balance assessment: Needs assistance Sitting-balance support: Bilateral upper extremity supported;Feet supported Sitting balance-Leahy Scale: Poor Sitting balance - Comments: at least 1 UE support to maintain balance sitting EOB and on BSC Postural control: Posterior lean Standing balance support: Bilateral upper extremity supported;During functional activity Standing balance-Leahy Scale: Poor Standing balance comment: Relies on Bil UE support.  Requires assist for pericare.                    Cognition Arousal/Alertness: Awake/alert Behavior During Therapy: Anxious;Impulsive Overall Cognitive Status: Impaired/Different from baseline Area of Impairment: Awareness;Safety/judgement;Problem solving;Following commands;Attention   Current Attention Level: Alternating   Following Commands: Follows one step commands inconsistently Safety/Judgement: Decreased awareness of safety Awareness: Emergent Problem Solving: Difficulty sequencing;Requires verbal cues;Requires tactile cues General Comments: Pt very fixated on bowels because they were moving throughout the session.  Poor safety.     Exercises      General Comments        Pertinent Vitals/Pain Pain Assessment: Faces Faces Pain Scale: Hurts little more Pain Location: lower abdomen Pain Descriptors / Indicators: Aching Pain Intervention(s): Limited activity within patient's tolerance;Monitored during session;Repositioned    Home Living  Prior Function            PT Goals (current goals can now be found in the care plan  section) Acute Rehab PT Goals Patient Stated Goal: none stated Progress towards PT goals: Progressing toward goals    Frequency  Min 3X/week    PT Plan Current plan remains appropriate    Co-evaluation             End of Session Equipment Utilized During Treatment: Gait belt Activity Tolerance: Patient limited by fatigue Patient left: in chair;with call bell/phone within reach;with chair alarm set     Time: [id number] PT Time Calculation (min) (ACUTE ONLY): 27 min  Charges:  $Gait Training: 8-22 mins $Therapeutic Activity: 8-22 mins                    G Codes:      Collie Siad PT, DPT  Pager: [phone] Phone: [phone] 09/12/2015, 10:47 AM

## 2015-09-12 NOTE — Care Management Important Message (Signed)
Important Message  Patient Details  Name: TRAMAIN FARAR MRN: [id number] Date of Birth: Oct 03, 1935   Medicare Important Message Given:  Yes    Carles Collet, RN 09/12/2015, 11:48 AMImportant Message  Patient Details  Name: JOSMAR SAWIN MRN: [id number] Date of Birth: [birth year]   Medicare Important Message Given:  Yes    Carles Collet, RN 09/12/2015, 11:48 AM

## 2015-09-12 NOTE — Progress Notes (Signed)
Daily Rounding Note  09/12/2015, 10:25 AM  LOS: 9 days   SUBJECTIVE:       Pt asked Flexiseal to be removed.  Loose stools better, less abdominal pain and improved rectal burning.  He feels better overall.   OBJECTIVE:         Vital signs in last 24 hours:    Temp:  [97.3 F (36.3 C)-98.8 F (37.1 C)] 97.3 F (36.3 C) (04/20 0641) Pulse Rate:  [73-93] 73 (04/20 0641) Resp:  [18] 18 (04/20 0641) BP: (122-125)/(45-56) 122/56 mmHg (04/20 0641) SpO2:  [97 %-98 %] 98 % (04/20 0641) Weight:  [90.13 kg (198 lb 11.2 oz)] 90.13 kg (198 lb 11.2 oz) (04/20 0630) Last BM Date: 09/12/15 Filed Weights   09/10/15 0500 09/11/15 0547 09/12/15 0630  Weight: 87.2 kg (192 lb 3.9 oz) 87.091 kg (192 lb) 90.13 kg (198 lb 11.2 oz)   General: not toxic, comfortable.     Heart:  A fib, rate in 90s Chest: clear bil.   Abdomen: soft, less tender to mild pain bil.  Active BS.  Bruising on LLQ  Extremities: + edema.   Neuro/Psych:  Oriented x 3.  Appropriate. Anxious.  Not depressed.   Lab Results:  Recent Labs  09/11/15 0205 09/11/15 1910 09/12/15 0359  WBC 18.5* 23.0* 25.8*  HGB 7.6* 7.5* 8.2*  HCT 24.6* 24.6* 27.3*  PLT 167 175 212   BMET  Recent Labs  09/10/15 0700 09/11/15 0205 09/12/15 0359  NA 143 139 139  K 2.9* 3.5 3.3*  CL 106 107 107  CO2 25 22 20*  GLUCOSE 143* 110* 133*  BUN 75* 59* 41*  CREATININE 2.39* 2.02* 2.03*  CALCIUM 8.2* 7.8* 8.1*   LFT  Recent Labs  09/10/15 0700 09/11/15 0205 09/12/15 0359  PROT 5.7* 5.4* 5.8*  ALBUMIN 2.7* 2.6* 2.7*  AST 53* 42* 31  ALT 35 32 30  ALKPHOS 62 52 57  BILITOT 1.3* 1.3* 1.1      ASSESMENT:   * Colitis suggested by follow up CT. CT is + and day 2 of oral Vanc.    "rectal bleeding" from severely macerated perirectum. Did not tolerated flexiseal. Pain in abdomen may be the colitis or it could be from extensive hematomas.  Oral vanc (since 4/19)   replaced previous IV vanc and Zosyn .  Stooling, abd pain and perirectal burning improved.   * Hx of multiple falls.  Has significant bruising all over flanks and mid/lower back, hips and legs. Hip hematoma shows up on CT.   * A fib, Xarelto at home. On hold with Heparin drip in place. Given his frequent falls and now, anemia, may be time to reassess risk/benefit here.   * Coagulopathy. Present at time of admission (pre Heparin). Not present 2 months ago. ? Due to xarleto ? Due to liver dysfunction.   * Apparent cirrhosis by CT. Check Hep ABC. Pt says he is a non-drinker. No previous dx of liver disease.  Hep ABC serologies, AMA, smooth muscle Ab, ANA in process.   * Anemia due to extensive hematomas.   * Seizure disorder.     PLAN   *  Continue oral vanco  *  GI PMD is Dr Tiffany Kocher in [place].    Azucena Freed  09/12/2015, 10:25 AM Pager: [phone]    [facility] Attending   I have taken an interval history, reviewed the chart and examined the patient. I agree  with the Advanced Practitioner's note, impression and recommendations.   He is better re: C diff it seems. I am going to sign off - rec: Tx C diff x 10 d w/ vancomycin. F/u viral hepatitis and other serologies Cirrhosis does not seem to be a sig clinical issue at this time  Call if ? No specific GI f/u now in [place] - Dr. Vira Agar in [place] has been seeing him.  Gatha Mayer, MD, [facility] [phone] (pager) [phone] after 5 PM, weekends and holidays  09/12/2015 9:59 PM

## 2015-09-13 ENCOUNTER — Encounter
Admission: RE | Admit: 2015-09-13 | Discharge: 2015-09-13 | Disposition: A | Discharge status: Home / Self Care | Payer: Medicare Other | Source: Ambulatory Visit | Attending: Internal Medicine | Admitting: Internal Medicine

## 2015-09-13 DIAGNOSIS — N183 Chronic kidney disease, stage 3 (moderate): Secondary | ICD-10-CM

## 2015-09-13 DIAGNOSIS — D649 Anemia, unspecified: Secondary | ICD-10-CM

## 2015-09-13 DIAGNOSIS — K745 Biliary cirrhosis, unspecified: Secondary | ICD-10-CM

## 2015-09-13 LAB — BASIC METABOLIC PANEL
Anion gap: 10 (ref 5–15)
Anion gap: 11 (ref 5–15)
BUN: 36 mg/dL — ABNORMAL HIGH (ref 6–20)
BUN: 35 mg/dL — ABNORMAL HIGH (ref 6–20)
CO2: 20 mmol/L — ABNORMAL LOW (ref 22–32)
CO2: 20 mmol/L — ABNORMAL LOW (ref 22–32)
Calcium: 7.8 mg/dL — ABNORMAL LOW (ref 8.9–10.3)
Calcium: 8 mg/dL — ABNORMAL LOW (ref 8.9–10.3)
Chloride: 107 mmol/L (ref 101–111)
Chloride: 106 mmol/L (ref 101–111)
Creatinine, Ser: 1.92 mg/dL — ABNORMAL HIGH (ref 0.61–1.24)
Creatinine, Ser: 1.95 mg/dL — ABNORMAL HIGH (ref 0.61–1.24)
GFR calc Af Amer: 37 mL/min — ABNORMAL LOW (ref >60)
GFR calc Af Amer: 36 mL/min — ABNORMAL LOW (ref >60)
GFR calc non Af Amer: 32 mL/min — ABNORMAL LOW (ref >60)
GFR calc non Af Amer: 31 mL/min — ABNORMAL LOW (ref >60)
Glucose, Bld: 98 mg/dL (ref 65–99)
Glucose, Bld: 96 mg/dL (ref 65–99)
Potassium: 3.6 mmol/L (ref 3.5–5.1)
Potassium: 3.9 mmol/L (ref 3.5–5.1)
Sodium: 137 mmol/L (ref 135–145)
Sodium: 137 mmol/L (ref 135–145)

## 2015-09-13 LAB — CBC WITH DIFFERENTIAL/PLATELET
Basophils Absolute: 0 10*3/uL (ref 0.0–0.1)
Basophils Relative: 0 %
Eosinophils Absolute: 0.2 10*3/uL (ref 0.0–0.7)
Eosinophils Relative: 1 %
HCT: 24.6 % — ABNORMAL LOW (ref 39.0–52.0)
Hemoglobin: 7.5 g/dL — ABNORMAL LOW (ref 13.0–17.0)
Lymphocytes Relative: 4 %
Lymphs Abs: 0.8 10*3/uL (ref 0.7–4.0)
MCH: 27.6 pg (ref 26.0–34.0)
MCHC: 30.5 g/dL (ref 30.0–36.0)
MCV: 90.4 fL (ref 78.0–100.0)
Monocytes Absolute: 0.8 10*3/uL (ref 0.1–1.0)
Monocytes Relative: 4 %
Neutro Abs: 18.3 10*3/uL — ABNORMAL HIGH (ref 1.7–7.7)
Neutrophils Relative %: 91 %
Platelets: 201 10*3/uL (ref 150–400)
RBC: 2.72 MIL/uL — ABNORMAL LOW (ref 4.22–5.81)
RDW: 20.2 % — ABNORMAL HIGH (ref 11.5–15.5)
WBC: 20.2 10*3/uL — ABNORMAL HIGH (ref 4.0–10.5)

## 2015-09-13 LAB — MAGNESIUM: Magnesium: 1.5 mg/dL — ABNORMAL LOW (ref 1.7–2.4)

## 2015-09-13 LAB — HEPATITIS C ANTIBODY: HCV Ab: <0.1 s/co ratio (ref 0.0–0.9)

## 2015-09-13 LAB — FANA STAINING PATTERNS: Homogeneous Pattern: 1:160 {titer} — ABNORMAL HIGH

## 2015-09-13 LAB — HEPATITIS A ANTIBODY, TOTAL: Hep A Total Ab: NEGATIVE

## 2015-09-13 LAB — MITOCHONDRIAL ANTIBODIES: Mitochondrial M2 Ab, IgG: 4.1 Units (ref 0.0–20.0)

## 2015-09-13 LAB — HEPATITIS B SURFACE ANTIGEN: Hepatitis B Surface Ag: NEGATIVE

## 2015-09-13 LAB — ANTINUCLEAR ANTIBODIES, IFA: ANA Ab, IFA: POSITIVE — AB

## 2015-09-13 LAB — HEPATITIS B SURFACE ANTIBODY,QUALITATIVE: Hep B S Ab: REACTIVE

## 2015-09-13 LAB — ANTI-SMOOTH MUSCLE ANTIBODY, IGG: F-Actin IgG: 10 Units (ref 0–19)

## 2015-09-13 MED ORDER — HYDROCORTISONE ACE-PRAMOXINE 1-1 % RE FOAM
1.0000 | Freq: Two times a day (BID) | RECTAL | Status: DC
  Administered 2015-09-13 – 2015-09-17 (×7): 1 via RECTAL
  Filled 2015-09-13 (×2): qty 10

## 2015-09-13 NOTE — Progress Notes (Signed)
Pt. States Rt. IJ central line very uncomfortable.  Redressed for comfort as best as possible.  Please consider discontinue central line if not needed.  If central line still desired please consider ordering a PICC to replace.  Thanks.

## 2015-09-13 NOTE — Progress Notes (Signed)
CSW provided bed offers to pt wife- she chooses [facility] spoke with [facility] and they can accept pt over the weekend if ready for DC  wknd report number [phone]  CSW will continue to follow  Domenica Reamer, [place] Social Worker [phone]

## 2015-09-13 NOTE — Progress Notes (Signed)
Progress Note    Vincent Mason  [id number] DOB: July 13, 1935  DOA: 09/03/2015 PCP: Lelon Huh, MD   Outpatient Specialists:   Dr. Vira Agar in [place], Gastroenterology    Brief Narrative:   Vincent Mason is an 80 y.o. male with a PMH of atrial fibrillation on Xarelto, chronic diastolic heart failure, pulmonary HTN, COPD unknown Stage, CKD Stage III, possible history of seizures who was admitted 09/03/15 after being found unresponsive at home. EMS reported he had unequal pupils with left larger than right. He was intubated on arrival to the ED due to acute respiratory failure. Wife reports several episodes of staring spells and several episodes of unresponsiveness that have lasted 5-10 minutes the past several months. He was admitted 12/2014 with similar presentation and thought to be having seizures at that time and started on Trileptal with neurology follow up. Patient transferred from [facility] on 09/09/15, continued to have persistent leukocytosis, abdominal pain. CT scan of the chest of the abdomen revealed patchy bilateral airspace disease and possible right and left ischemic colitis. Patient now has profound diarrhea, started on antibiotics for colitis, rule out C. difficile, GI consultation requested and pending at this time.  Assessment/Plan:   Principal Problem:   Acute respiratory failure with hypoxia (HCC) secondary to acute septic encephalopathy / HCAP Presented after being found unresponsive at home in acute respiratory failure, initially suspicious for possible seizure event in the setting of chronic opiate/benzodiazepine dependency. Intubated on admission. CT of the head negative for acute bleed. Neurology saw the patient consultation and felt presentation most consistent with septic encephalopathy. Extubated 09/05/15. Although initial chest x-ray showed significant infiltrates, these appeared to improve with diuresis and it was unclear if his presenting shock was from sepsis  secondary to pneumonia. He did complete treatment with Levaquin and vancomycin for presumed aspiration pneumonia. He underwent a CT scan of the chest on 09/10/15 which showed patchy bilateral airspace disease suggestive of bronchopneumonia and antibiotics were reinitiated with vancomycin and Zosyn to cover healthcare associated pathogens. Antibiotics were subsequently discontinued 09/11/15 and setting of C. difficile infection. Developed cough and shortness of breath last night. Currently denies dyspnea, but continues to cough. Will hold antibiotics for now.  Severe sepsis with Septic shock Required pressor support with stabilization of blood pressure on broad-spectrum antibiotics. Pressors were weaned by 09/07/15. No evidence of adrenal insufficiency. Source suspected to be from aspiration pneumonia versus UTI. Blood cultures and sputum cultures negative. Will get speech therapy evaluation.  Active Problems:   Seizures ([place]) EEG done 09/03/15, no seizure focus identified.    Hyperlipidemia Crestor currently on hold. Has had transaminitis while in the hospital. LFTs now normalized. Resume Crestor.    Coronary artery disease Resume statin.    Acute on chronic renal failure/Chronic kidney disease (CKD), stage III (moderate)/acute tubular necrosis Had significant deterioration of renal function in the setting of hypotension and shock. Creatinine has improved from 5.25---> 1.92.    COPD (chronic obstructive pulmonary disease) (HCC) Stable.    Normocytic anemia/fecal occult blood test positive Thought to be from severely macerated perirectum, and multiple hematomas including a hip hematoma seen on CT scanning.    Opioid and benzodiazepine dependence (HCC) Currently on Valium 2 mg every 8 hours. Also has opiates ordered as needed.    Pulmonary arterial hypertension (HCC)    Status post peripherally inserted central catheter (PICC) central line placement    C. difficile colitis/abdominal  pain Developed significant diarrhea and abdominal pain in the setting  of recent broad-spectrum antibiotics. C. difficile testing sent and returned positive for/19/17. Placed on oral vancomycin with plans to complete a ten-day course.    Acute on chronic diastolic CHF (congestive heart failure), NYHA class 1 (HCC) Chest x-ray on 09/03/15 showed worsening changes of CHF with interstitial and alveolar edema. Diuresed. 2-D echo repeated 09/04/15 and showed EF 60-65 percent with moderate to severe pulmonary hypertension.    Chronic atrial fibrillation (HCC) Xarelto held on admission and the patient was treated with IV heparin. Heparin was subsequently discontinued in the setting of significant anemia and fecal occult positive blood in the stools. Given history of falls and coagulopathy, will need to have a discussion with the patient and his family regarding risk versus benefit of ongoing anticoagulation.    Essential hypertension Blood pressure stable off antihypertensives.    Apparent cirrhosis by CT GI in the process of evaluating with serologies. Hepatitis serologies negative. ANA, anti-smooth muscle and antimitochondrial antibodies are pending.   Family Communication/Anticipated D/C date and plan/Code Status   DVT prophylaxis: Lovenox ordered. Code Status: Full Code.  Family Communication: Wife, [name] called at [phone]. Updated by telephone. Disposition Plan: SNF when medically stable, and able to maintain hydration status given ongoing diarrhea.   Medical Consultants:    Gastroenterology  Neurology  Pulmonology   Procedures:    Intubation  EEG  2-D echo  Anti-Infectives:    Levaquin 4/11-4/16  Vancomycin 4/11-4/14  Restarted vanc and zosyn 4/19 but stopped on the same day  Oral vancomycin 4/19  Subjective:   Vincent Mason reports some lower abdominal pain.Has had 2 stools so far today. Appetite remains fair. No nausea or vomiting. Has a cough, but denies  dyspnea. Complains of cramping in his arms.  Objective:    Filed Vitals:   09/12/15 [id number] 09/12/15 2226 09/13/15 0426 09/13/15 0509  BP: 122/56 114/57 113/58   Pulse: 73 104 88   Temp: 97.3 F (36.3 C) 98.8 F (37.1 C) 98.4 F (36.9 C)   TempSrc:  Oral Oral   Resp: 18 20 20    Height:      Weight:    89.404 kg (197 lb 1.6 oz)  SpO2: 98% 93% 100%     Intake/Output Summary (Last 24 hours) at 09/13/15 0849 Last data filed at 09/13/15 0700  Gross per 24 hour  Intake   1302 ml  Output   3000 ml  Net  -1698 ml   Filed Weights   09/11/15 0547 09/12/15 0630 09/13/15 0509  Weight: 87.091 kg (192 lb) 90.13 kg (198 lb 11.2 oz) 89.404 kg (197 lb 1.6 oz)    Exam: General exam: Appears calm and comfortable with right IJ in place.  Respiratory system: A few expiratory wheezes. Respiratory effort normal. Cardiovascular system: S1 & S2 heard, slightly irregular. No JVD, murmurs, rubs, gallops or clicks. + pedal edema. Gastrointestinal system: Abdomen is nondistended, soft and nontender. No organomegaly or masses felt. Normal bowel sounds heard. Central nervous system: Alert and oriented to self and place. No focal neurological deficits. Extremities: Symmetric 4+ x 5 power. No clubbing or cyanosis. Skin: Venous stasis dermatitis with ulcerations/wounds to lower extremity. Psychiatry: Judgement and insight appear normal. Mood & affect appropriate.   Data Reviewed:   I have personally reviewed following labs and imaging studies:  Labs: Basic Metabolic Panel:  Recent Labs Lab 09/06/15 1810 09/07/15 0430 09/08/15 0330  09/09/15 1225 09/10/15 0700 09/11/15 0205 09/12/15 0359 09/13/15 0355  NA  --  141 141  < >  143 143 139 139 137  K  --  2.8* 2.8*  < > 2.7* 2.9* 3.5 3.3* 3.6  CL  --  93* 97*  < > 103 106 107 107 107  CO2  --  29 29  < > 27 25 22  20* 20*  GLUCOSE  --  129* 130*  < > 121* 143* 110* 133* 98  BUN  --  159* 144*  < > 97* 75* 59* 41* 36*  CREATININE  --  5.22* 4.45*   < > 3.10* 2.39* 2.02* 2.03* 1.92*  CALCIUM  --  8.4* 8.0*  < > 8.3* 8.2* 7.8* 8.1* 7.8*  MG 1.9 2.1 2.1  --  2.1  --   --   --   --   PHOS  --   --  4.0  --   --   --   --   --   --   < > = values in this interval not displayed. GFR Estimated Creatinine Clearance: 34.4 mL/min (by C-G formula based on Cr of 1.92). Liver Function Tests:  Recent Labs Lab 09/09/15 0214 09/10/15 0700 09/11/15 0205 09/12/15 0359  AST 45* 53* 42* 31  ALT 31 35 32 30  ALKPHOS 60 62 52 57  BILITOT 1.2 1.3* 1.3* 1.1  PROT 5.6* 5.7* 5.4* 5.8*  ALBUMIN 2.6* 2.7* 2.6* 2.7*   Coagulation profile  Recent Labs Lab 09/11/15 1910  INR 1.46    CBC:  Recent Labs Lab 09/10/15 1806 09/11/15 0205 09/11/15 1910 09/12/15 0359 09/13/15 0355  WBC 19.7* 18.5* 23.0* 25.8* 20.2*  NEUTROABS  --   --   --  24.0* 18.3*  HGB 7.6* 7.6* 7.5* 8.2* 7.5*  HCT 24.1* 24.6* 24.6* 27.3* 24.6*  MCV 89.9 90.1 90.8 91.6 90.4  PLT 153 167 175 212 201   CBG:  Recent Labs Lab 09/08/15 0419 09/08/15 1252 09/08/15 1638 09/08/15 1959 09/09/15 0003  GLUCAP 135* 126* 149* 171* 109*   Sepsis Labs:  Recent Labs Lab 09/11/15 0205 09/11/15 1910 09/12/15 0359 09/13/15 0355  WBC 18.5* 23.0* 25.8* 20.2*   Urine analysis:    Component Value Date/Time   COLORURINE YELLOW 09/03/2015 0346   COLORURINE Yellow 06/30/2014 0729   APPEARANCEUR TURBID* 09/03/2015 0346   APPEARANCEUR Clear 06/30/2014 0729   LABSPEC 1.013 09/03/2015 0346   LABSPEC 1.016 06/30/2014 0729   PHURINE 5.5 09/03/2015 0346   PHURINE 7.0 06/30/2014 0729   GLUCOSEU NEGATIVE 09/03/2015 0346   GLUCOSEU Negative 06/30/2014 0729   HGBUR MODERATE* 09/03/2015 0346   HGBUR 1+ 06/30/2014 0729   BILIRUBINUR NEGATIVE 09/03/2015 0346   BILIRUBINUR Negative 06/30/2014 0729   KETONESUR NEGATIVE 09/03/2015 0346   KETONESUR Negative 06/30/2014 0729   PROTEINUR >300* 09/03/2015 0346   PROTEINUR 30 mg/dL 06/30/2014 0729   NITRITE NEGATIVE 09/03/2015 0346    NITRITE Negative 06/30/2014 0729   LEUKOCYTESUR SMALL* 09/03/2015 0346   LEUKOCYTESUR Negative 06/30/2014 0729   Microbiology Recent Results (from the past 240 hour(s))  Respiratory virus panel     Status: None   Collection Time: 09/03/15  6:21 PM  Result Value Ref Range Status   Source - RVPAN NASAL SWAB  Corrected   Respiratory Syncytial Virus A Negative Negative Final   Respiratory Syncytial Virus B Negative Negative Final   Influenza A Negative Negative Final   Influenza B Negative Negative Final   Parainfluenza 1 Negative Negative Final   Parainfluenza 2 Negative Negative Final   Parainfluenza 3 Negative Negative Final  Metapneumovirus Negative Negative Final   Rhinovirus Negative Negative Final   Adenovirus Negative Negative Final    Comment: (NOTE) Performed At: [facility] [place], [place] [id number] Lindon Romp MD [id number]   C difficile quick scan w PCR reflex     Status: Abnormal   Collection Time: 09/11/15  3:36 PM  Result Value Ref Range Status   C Diff antigen POSITIVE (A) NEGATIVE Final   C Diff toxin POSITIVE (A) NEGATIVE Final   C Diff interpretation Positive for toxigenic C. difficile  Final    Comment: CRITICAL RESULT CALLED TO, READ BACK BY AND VERIFIED WITH: GLOSSON RN 16:30 09/11/15 ([email or web address])     Radiology: No results found.  Medications:   . diazepam  2 mg Oral Q8H  . digoxin  0.0625 mg Oral Daily  . enoxaparin (LOVENOX) injection  40 mg Subcutaneous Q24H  . famotidine  20 mg Oral Daily  . febuxostat  80 mg Oral Daily  . feeding supplement (ENSURE ENLIVE)  237 mL Oral BID BM  . gabapentin  100 mg Oral BID  . metronidazole  500 mg Intravenous Q8H  . OXcarbazepine  150 mg Oral BID  . potassium chloride  60 mEq Oral BID  . rOPINIRole  2 mg Oral QHS  . rosuvastatin  40 mg Oral q1800  . saccharomyces boulardii  250 mg Oral BID  . sodium chloride flush  10-40 mL Intracatheter Q12H  . vancomycin  125 mg Oral Q6H     Continuous Infusions:   Time spent: 35 minutes with > 50% of time discussing current diagnostic test results, clinical impression and plan of care with the patient's wife.    LOS: 10 days   [facility] Pager [phone]. If unable to reach me by pager, please call my cell phone at [phone].  *Please refer to [email or web address], password [email or web address] to get updated schedule on who will round on this patient, as hospitalists switch teams weekly. If 7PM-7AM, please contact night-coverage at [email or web address], password [email or web address] for any overnight needs.  09/13/2015, 8:49 AM

## 2015-09-14 ENCOUNTER — Inpatient Hospital Stay (HOSPITAL_COMMUNITY): Payer: Medicare Other

## 2015-09-14 DIAGNOSIS — B370 Candidal stomatitis: Secondary | ICD-10-CM

## 2015-09-14 LAB — CBC WITH DIFFERENTIAL/PLATELET
Basophils Absolute: 0 10*3/uL (ref 0.0–0.1)
Basophils Relative: 0 %
Eosinophils Absolute: 0.3 10*3/uL (ref 0.0–0.7)
Eosinophils Relative: 2 %
HCT: 24.6 % — ABNORMAL LOW (ref 39.0–52.0)
Hemoglobin: 7.4 g/dL — ABNORMAL LOW (ref 13.0–17.0)
Lymphocytes Relative: 6 %
Lymphs Abs: 0.9 10*3/uL (ref 0.7–4.0)
MCH: 27.5 pg (ref 26.0–34.0)
MCHC: 30.1 g/dL (ref 30.0–36.0)
MCV: 91.4 fL (ref 78.0–100.0)
Monocytes Absolute: 1 10*3/uL (ref 0.1–1.0)
Monocytes Relative: 6 %
Neutro Abs: 13.3 10*3/uL — ABNORMAL HIGH (ref 1.7–7.7)
Neutrophils Relative %: 86 %
Platelets: 222 10*3/uL (ref 150–400)
RBC: 2.69 MIL/uL — ABNORMAL LOW (ref 4.22–5.81)
RDW: 20.5 % — ABNORMAL HIGH (ref 11.5–15.5)
WBC: 15.5 10*3/uL — ABNORMAL HIGH (ref 4.0–10.5)

## 2015-09-14 LAB — LACTIC ACID, PLASMA
Lactic Acid, Venous: 1.9 mmol/L (ref 0.5–2.0)
Lactic Acid, Venous: 1.4 mmol/L (ref 0.5–2.0)

## 2015-09-14 MED ORDER — FLUCONAZOLE 40 MG/ML PO SUSR
100.0000 mg | Freq: Every day | ORAL | Status: DC
  Administered 2015-09-14 – 2015-09-17 (×4): 100 mg via ORAL
  Filled 2015-09-14 (×4): qty 2.5

## 2015-09-14 MED ORDER — MAGIC MOUTHWASH W/LIDOCAINE
10.0000 mL | Freq: Four times a day (QID) | ORAL | Status: DC
  Administered 2015-09-14 – 2015-09-17 (×13): 10 mL via ORAL
  Filled 2015-09-14 (×11): qty 10

## 2015-09-14 MED ORDER — MAGNESIUM SULFATE 2 GM/50ML IV SOLN
2.0000 g | Freq: Once | INTRAVENOUS | Status: AC
  Administered 2015-09-14: 2 g via INTRAVENOUS
  Filled 2015-09-14: qty 50

## 2015-09-14 MED ORDER — RESOURCE THICKENUP CLEAR PO POWD
ORAL | Status: DC | PRN
  Filled 2015-09-14: qty 125

## 2015-09-14 NOTE — Progress Notes (Signed)
SLP MBS Note  Patient Details Name: Vincent Mason MRN: [id number] DOB: 14-Jan-1936   MBSS was completed.  Please see complete charting in imaging.  Recommending a dysphagia 1 diet with nectar thick liquids.  ST to follow.       Shelly Flatten, MA, [facility] SLP [phone] Vincent Mason 09/14/2015, 1:10 PM

## 2015-09-14 NOTE — Evaluation (Signed)
Clinical/Bedside Swallow Evaluation Patient Details  Name: Vincent Mason MRN: [id number] Date of Birth: June 12, 1935  Today's Date: 09/14/2015 Time: SLP Start Time (ACUTE ONLY): 1045 SLP Stop Time (ACUTE ONLY): 1115 SLP Time Calculation (min) (ACUTE ONLY): 30 min  Past Medical History:  Past Medical History  Diagnosis Date  . Hypertension   . Hyperlipidemia   . COPD (chronic obstructive pulmonary disease) ([place])   . Kidney disease, chronic, stage III (moderate, EGFR 30-59 ml/min)     ARF 2013  . Atrial fibrillation and flutter [facility]) 2013    Dr Nehemiah Massed at [facility]  on [place].   . OSA (obstructive sleep apnea)     non-compliant with CPAP.    Past Surgical History:  Past Surgical History  Procedure Laterality Date  . Hyperplastic colon polyp  02/2002    Sigmoid polyps  . Coronary artery bypass graft  10/1997  . History of cervical discectomy  2009    C5-C45   HPI:  80 year old male found unresponsive at home with acute respiratory failure, acute renal failure and acute anemia. History of possible seizure activity, chronic opioid/bzd dependence. Intubated from 4/11 to 4/13.    Assessment / Plan / Recommendation Clinical Impression  Repeat clinical swallowing evaluation was completed.  Limited oral mechanism exam completed due to lingual pain.  The patient's lingual and labial range of motion were adequate.  Strength was unable to be assessed although overt issues were not noted.  The patient was noted to have significant upper respiratory wheeze/secretions that became worse with intake.  The patient was unable to elaborate on how long this has been going on except to say that he frequently has issues with shortness of breath and it seems to happen frequently while he's eating/drinking.  Clinically the patient presented with possible pharyngeal dysphagia.  Swallow trigger was timely and hyo-laryngeal excursion was judged to be adequate, however, the patient presented with consistent delayed  cough across all textures.  In addition, he refused dry solids due to lingual pain.  Recommend a dysphagia 1 diet due to lingual pain and thin liquids pending results of MBS to rule out aspiration.        Aspiration Risk  Mild aspiration risk    Diet Recommendation   Dysphagia 1 with thin liquids pending MBS  Medication Administration: Whole meds with puree    Other  Recommendations Oral Care Recommendations: Oral care BID   Follow up Recommendations   (TBD)    Frequency and Duration min 2x/week  2 weeks       Prognosis Prognosis for Safe Diet Advancement: Good      Swallow Study   General Date of Onset: 09/03/15 HPI: 80 year old male found unresponsive at home with acute respiratory failure, acute renal failure and acute anemia. History of possible seizure activity, chronic opioid/bzd dependence. Intubated from 4/11 to 4/13.  Type of Study: Bedside Swallow Evaluation Previous Swallow Assessment: BSE 09/06/2015 with recommendation for a regular diet and thin liquids.   Diet Prior to this Study: Regular;Thin liquids Temperature Spikes Noted: No Respiratory Status: Room air History of Recent Intubation: Yes Length of Intubations (days): 2 days Date extubated: 09/05/15 Behavior/Cognition: Alert;Cooperative;Pleasant mood;Requires cueing Oral Cavity Assessment: Dried secretions (Patient with c/o significant lingual pain.  ) Oral Care Completed by SLP: No Oral Cavity - Dentition: Adequate natural dentition Vision: Functional for self-feeding Self-Feeding Abilities: Able to feed self Patient Positioning: Upright in chair Baseline Vocal Quality: Normal Volitional Cough: Strong Volitional Swallow: Able to  elicit    Oral/Motor/Sensory Function Overall Oral Motor/Sensory Function:  (Limited assessment due to lingual pain.  )   Ice Chips Ice chips: Not tested   Thin Liquid Thin Liquid: Impaired Presentation: Cup;Self Fed Pharyngeal  Phase Impairments: Suspected delayed  Swallow;Cough - Delayed    Nectar Thick Nectar Thick Liquid: Not tested   Honey Thick Honey Thick Liquid: Not tested   Puree Puree: Impaired Presentation: Spoon Pharyngeal Phase Impairments: Suspected delayed Swallow;Cough - Delayed   Solid   GO   Solid: Not tested (Pt refused due to lingual pain.  )       Shelly Flatten, MA, CCC-SLP Acute Rehab SLP [phone] Shelly Flatten N 09/14/2015,11:26 AM

## 2015-09-14 NOTE — Progress Notes (Addendum)
Progress Note    Vincent Mason  [id number] DOB: September 03, 1935  DOA: 09/03/2015 PCP: Vincent Huh, MD   Outpatient Specialists:   Dr. Vira Agar in [place], Gastroenterology    Brief Narrative:   Vincent Mason is an 80 y.o. male with a PMH of atrial fibrillation on Xarelto, chronic diastolic heart failure, pulmonary HTN, COPD unknown Stage, CKD Stage III, possible history of seizures who was admitted 09/03/15 after being found unresponsive at home. EMS reported he had unequal pupils with left larger than right. He was intubated on arrival to the ED due to acute respiratory failure. Wife reports several episodes of staring spells and several episodes of unresponsiveness that have lasted 5-10 minutes the past several months. He was admitted 12/2014 with similar presentation and thought to be having seizures at that time and started on Trileptal with neurology follow up. Patient transferred from [facility] on 09/09/15, continued to have persistent leukocytosis, abdominal pain. CT scan of the chest of the abdomen revealed patchy bilateral airspace disease and possible right and left ischemic colitis. Patient now has profound diarrhea, started on antibiotics for colitis, rule out C. difficile, GI consultation requested.  Assessment/Plan:   Principal Problem:   Acute respiratory failure with hypoxia (HCC) secondary to acute septic encephalopathy / HCAP Presented after being found unresponsive at home in acute respiratory failure, initially suspicious for possible seizure event in the setting of chronic opiate/benzodiazepine dependency. Intubated on admission. CT of the head negative for acute bleed. Neurology saw the patient consultation and felt presentation most consistent with septic encephalopathy. Extubated 09/05/15. Although initial chest x-ray showed significant infiltrates, these appeared to improve with diuresis and it was unclear if his presenting shock was from sepsis secondary to pneumonia. He  did complete treatment with Levaquin and vancomycin for presumed aspiration pneumonia. He underwent a CT scan of the chest on 09/10/15 which showed patchy bilateral airspace disease suggestive of bronchopneumonia and antibiotics were reinitiated with vancomycin and Zosyn to cover healthcare associated pathogens. Antibiotics were subsequently discontinued 09/11/15 and setting of C. difficile infection. Developed cough and shortness of breath last night. Currently denies dyspnea, but continues to cough. Will hold antibiotics for now.  Severe sepsis with Septic shock Required pressor support with stabilization of blood pressure on broad-spectrum antibiotics. Pressors were weaned by 09/07/15. No evidence of adrenal insufficiency. Source suspected to be from aspiration pneumonia versus UTI. Blood cultures and sputum cultures negative. Speech therapy evaluation pending.  Active Problems:   Oral thrush Start Diflucan 100 mg x 7 days and Magic mouthwash QID.    Seizures ([place]) EEG done 09/03/15, no seizure focus identified.    Hyperlipidemia Crestor currently on hold. Has had transaminitis while in the hospital. LFTs now normalized. Resume Crestor.    Coronary artery disease Continue statin.    Acute on chronic renal failure/Chronic kidney disease (CKD), stage III (moderate)/acute tubular necrosis Had significant deterioration of renal function in the setting of hypotension and shock. Creatinine has improved from 5.25---> 1.95.    COPD (chronic obstructive pulmonary disease) (HCC) Stable.    Normocytic anemia/fecal occult blood test positive Thought to be from severely macerated perirectum, and multiple hematomas including a hip hematoma seen on CT scanning. Hemoglobin stable.    Opioid and benzodiazepine dependence (HCC) Currently on Valium 2 mg every 8 hours. Also has opiates ordered as needed.    Pulmonary arterial hypertension (HCC)    Status post peripherally inserted central catheter (PICC)  central line placement    C.  difficile colitis/abdominal pain Developed significant diarrhea and abdominal pain in the setting of recent broad-spectrum antibiotics. C. difficile testing sent and returned positive for/19/17. Placed on oral vancomycin with plans to complete a ten-day course.    Acute on chronic diastolic CHF (congestive heart failure), NYHA class 1 (HCC) Chest x-ray on 09/03/15 showed worsening changes of CHF with interstitial and alveolar edema. Diuresed. 2-D echo repeated 09/04/15 and showed EF 60-65 percent with moderate to severe pulmonary hypertension.    Chronic atrial fibrillation (HCC) Xarelto held on admission and the patient was treated with IV heparin. Heparin was subsequently discontinued in the setting of significant anemia and fecal occult positive blood in the stools. Given history of falls and coagulopathy, will need to have a discussion with the patient and his family regarding risk versus benefit of ongoing anticoagulation.    Essential hypertension Blood pressure stable off antihypertensives.    Apparent cirrhosis by CT GI in the process of evaluating with serologies. Hepatitis serologies negative. ANA was positive, anti-smooth muscle and antimitochondrial antibodies were negative.    Hypomagnesemia We'll give 2 g of replacement today.   Family Communication/Anticipated D/C date and plan/Code Status   DVT prophylaxis: Lovenox ordered. Code Status: Full Code.  Family Communication: Wife, Vincent Mason called at [phone]. Updated by telephone. Disposition Plan: SNF when medically stable, and able to maintain hydration status given ongoing diarrhea.   Medical Consultants:    Gastroenterology  Neurology  Pulmonology   Procedures:    Intubation  EEG  2-D echo  Anti-Infectives:    Levaquin 4/11-4/16  Vancomycin 4/11-4/14  Restarted vanc and zosyn 4/19 but stopped on the same day  Oral vancomycin 4/19  Subjective:   Vincent Mason  reports mouth pain, and leg pain.  Occasional abdominal pain associated with bowel movements. Diarrhea improving. Appetite remains fair. No nausea or vomiting. Has a cough, but denies dyspnea. Less cramping in his arms.  Objective:    Filed Vitals:   09/13/15 [id number] 09/13/15 1633 09/13/15 2058 09/14/15 0642  BP:  127/46 111/40 136/75  Pulse: 90 92 94 102  Temp:  98 F (36.7 C) 99.1 F (37.3 C) 97.5 F (36.4 C)  TempSrc:   Oral Oral  Resp:  18 18 18   Height:      Weight:    88.769 kg (195 lb 11.2 oz)  SpO2:  100% 96% 98%    Intake/Output Summary (Last 24 hours) at 09/14/15 0801 Last data filed at 09/14/15 0643  Gross per 24 hour  Intake   1280 ml  Output   1100 ml  Net    180 ml   Filed Weights   09/12/15 0630 09/13/15 0509 09/14/15 0642  Weight: 90.13 kg (198 lb 11.2 oz) 89.404 kg (197 lb 1.6 oz) 88.769 kg (195 lb 11.2 oz)    Exam: General exam: Appears calm and comfortable with right IJ in place.  Oropharynx: Thrush on tongue. Respiratory system: Lungs clear. Respiratory effort normal. Cardiovascular system: S1 & S2 heard, slightly irregular. No JVD, murmurs, rubs, gallops or clicks. + pedal edema. Gastrointestinal system: Abdomen is mildly distended and nontender. No organomegaly or masses felt. Normal bowel sounds heard. Central nervous system: Alert and oriented to self and place. No focal neurological deficits. Extremities: Symmetric 4+ x 5 power. No clubbing or cyanosis. Skin: Venous stasis dermatitis with ulcerations/wounds to lower extremity. Psychiatry: Judgement and insight appear normal. Mood & affect appropriate.   Data Reviewed:   I have personally reviewed following labs and imaging  studies:  Labs: Basic Metabolic Panel:  Recent Labs Lab 09/08/15 0330  09/09/15 1225 09/10/15 0700 09/11/15 0205 09/12/15 0359 09/13/15 0355 09/13/15 1553  NA 141  < > 143 143 139 139 137 137  K 2.8*  < > 2.7* 2.9* 3.5 3.3* 3.6 3.9  CL 97*  < > 103 106 107 107 107  106  CO2 29  < > 27 25 22  20* 20* 20*  GLUCOSE 130*  < > 121* 143* 110* 133* 98 96  BUN 144*  < > 97* 75* 59* 41* 36* 35*  CREATININE 4.45*  < > 3.10* 2.39* 2.02* 2.03* 1.92* 1.95*  CALCIUM 8.0*  < > 8.3* 8.2* 7.8* 8.1* 7.8* 8.0*  MG 2.1  --  2.1  --   --   --   --  1.5*  PHOS 4.0  --   --   --   --   --   --   --   < > = values in this interval not displayed. GFR Estimated Creatinine Clearance: 33.8 mL/min (by C-G formula based on Cr of 1.95). Liver Function Tests:  Recent Labs Lab 09/09/15 0214 09/10/15 0700 09/11/15 0205 09/12/15 0359  AST 45* 53* 42* 31  ALT 31 35 32 30  ALKPHOS 60 62 52 57  BILITOT 1.2 1.3* 1.3* 1.1  PROT 5.6* 5.7* 5.4* 5.8*  ALBUMIN 2.6* 2.7* 2.6* 2.7*   Coagulation profile  Recent Labs Lab 09/11/15 1910  INR 1.46    CBC:  Recent Labs Lab 09/11/15 0205 09/11/15 1910 09/12/15 0359 09/13/15 0355 09/14/15 0444  WBC 18.5* 23.0* 25.8* 20.2* 15.5*  NEUTROABS  --   --  24.0* 18.3* 13.3*  HGB 7.6* 7.5* 8.2* 7.5* 7.4*  HCT 24.6* 24.6* 27.3* 24.6* 24.6*  MCV 90.1 90.8 91.6 90.4 91.4  PLT 167 175 212 201 222   CBG:  Recent Labs Lab 09/08/15 0419 09/08/15 1252 09/08/15 1638 09/08/15 1959 09/09/15 0003  GLUCAP 135* 126* 149* 171* 109*   Sepsis Labs:  Recent Labs Lab 09/11/15 1910 09/12/15 0359 09/13/15 0355 09/14/15 0444  WBC 23.0* 25.8* 20.2* 15.5*   Urine analysis:    Component Value Date/Time   COLORURINE YELLOW 09/03/2015 0346   COLORURINE Yellow 06/30/2014 0729   APPEARANCEUR TURBID* 09/03/2015 0346   APPEARANCEUR Clear 06/30/2014 0729   LABSPEC 1.013 09/03/2015 0346   LABSPEC 1.016 06/30/2014 0729   PHURINE 5.5 09/03/2015 0346   PHURINE 7.0 06/30/2014 0729   GLUCOSEU NEGATIVE 09/03/2015 0346   GLUCOSEU Negative 06/30/2014 0729   HGBUR MODERATE* 09/03/2015 0346   HGBUR 1+ 06/30/2014 0729   BILIRUBINUR NEGATIVE 09/03/2015 0346   BILIRUBINUR Negative 06/30/2014 0729   KETONESUR NEGATIVE 09/03/2015 0346   KETONESUR  Negative 06/30/2014 0729   PROTEINUR >300* 09/03/2015 0346   PROTEINUR 30 mg/dL 06/30/2014 0729   NITRITE NEGATIVE 09/03/2015 0346   NITRITE Negative 06/30/2014 0729   LEUKOCYTESUR SMALL* 09/03/2015 0346   LEUKOCYTESUR Negative 06/30/2014 0729   Microbiology Recent Results (from the past 240 hour(s))  C difficile quick scan w PCR reflex     Status: Abnormal   Collection Time: 09/11/15  3:36 PM  Result Value Ref Range Status   C Diff antigen POSITIVE (A) NEGATIVE Final   C Diff toxin POSITIVE (A) NEGATIVE Final   C Diff interpretation Positive for toxigenic C. difficile  Final    Comment: CRITICAL RESULT CALLED TO, READ BACK BY AND VERIFIED WITH: GLOSSON RN 16:30 09/11/15 ([email or web address])     Radiology:  No results found.  Medications:   . diazepam  2 mg Oral Q8H  . digoxin  0.0625 mg Oral Daily  . enoxaparin (LOVENOX) injection  40 mg Subcutaneous Q24H  . famotidine  20 mg Oral Daily  . febuxostat  80 mg Oral Daily  . feeding supplement (ENSURE ENLIVE)  237 mL Oral BID BM  . gabapentin  100 mg Oral BID  . hydrocortisone-pramoxine  1 applicator Rectal BID  . metronidazole  500 mg Intravenous Q8H  . OXcarbazepine  150 mg Oral BID  . potassium chloride  60 mEq Oral BID  . rOPINIRole  2 mg Oral QHS  . rosuvastatin  40 mg Oral q1800  . saccharomyces boulardii  250 mg Oral BID  . sodium chloride flush  10-40 mL Intracatheter Q12H  . vancomycin  125 mg Oral Q6H   Continuous Infusions:   Time spent: 35 minutes with > 50% of time discussing current diagnostic test results, clinical impression and plan of care with the patient's wife.    LOS: 11 days   [facility] Pager [phone]. If unable to reach me by pager, please call my cell phone at [phone].  *Please refer to [email or web address], password [email or web address] to get updated schedule on who will round on this patient, as hospitalists switch teams weekly. If 7PM-7AM, please contact night-coverage at [email or web address], password [email or web address]  for any overnight needs.  09/14/2015, 8:01 AM

## 2015-09-14 NOTE — Progress Notes (Signed)
Pt has been on the bedpan for 20 minutes and refuses to let me take him off of it.

## 2015-09-15 ENCOUNTER — Inpatient Hospital Stay (HOSPITAL_COMMUNITY): Payer: Medicare Other

## 2015-09-15 DIAGNOSIS — B370 Candidal stomatitis: Secondary | ICD-10-CM

## 2015-09-15 DIAGNOSIS — I10 Essential (primary) hypertension: Secondary | ICD-10-CM

## 2015-09-15 LAB — BASIC METABOLIC PANEL
Anion gap: 7 (ref 5–15)
BUN: 25 mg/dL — ABNORMAL HIGH (ref 6–20)
CO2: 20 mmol/L — ABNORMAL LOW (ref 22–32)
Calcium: 7.9 mg/dL — ABNORMAL LOW (ref 8.9–10.3)
Chloride: 111 mmol/L (ref 101–111)
Creatinine, Ser: 1.84 mg/dL — ABNORMAL HIGH (ref 0.61–1.24)
GFR calc Af Amer: 39 mL/min — ABNORMAL LOW (ref >60)
GFR calc non Af Amer: 33 mL/min — ABNORMAL LOW (ref >60)
Glucose, Bld: 100 mg/dL — ABNORMAL HIGH (ref 65–99)
Potassium: 4.5 mmol/L (ref 3.5–5.1)
Sodium: 138 mmol/L (ref 135–145)

## 2015-09-15 LAB — BLOOD GAS, ARTERIAL
Acid-base deficit: 5.8 mmol/L — ABNORMAL HIGH (ref 0.0–2.0)
Bicarbonate: 17.8 mEq/L — ABNORMAL LOW (ref 20.0–24.0)
FIO2: 0.36
O2 Saturation: 93.7 %
Patient temperature: 98.6
TCO2: 18.6 mmol/L (ref 0–100)
pCO2 arterial: 27.4 mmHg — ABNORMAL LOW (ref 35.0–45.0)
pH, Arterial: 7.428 (ref 7.350–7.450)
pO2, Arterial: 67.8 mmHg — ABNORMAL LOW (ref 80.0–100.0)

## 2015-09-15 LAB — CBC
HCT: 24.5 % — ABNORMAL LOW (ref 39.0–52.0)
Hemoglobin: 7.5 g/dL — ABNORMAL LOW (ref 13.0–17.0)
MCH: 27.9 pg (ref 26.0–34.0)
MCHC: 30.6 g/dL (ref 30.0–36.0)
MCV: 91.1 fL (ref 78.0–100.0)
Platelets: 252 10*3/uL (ref 150–400)
RBC: 2.69 MIL/uL — ABNORMAL LOW (ref 4.22–5.81)
RDW: 20.7 % — ABNORMAL HIGH (ref 11.5–15.5)
WBC: 14.7 10*3/uL — ABNORMAL HIGH (ref 4.0–10.5)

## 2015-09-15 LAB — MAGNESIUM: Magnesium: 1.7 mg/dL (ref 1.7–2.4)

## 2015-09-15 MED ORDER — LIDOCAINE VISCOUS 2 % MT SOLN
15.0000 mL | OROMUCOSAL | Status: DC | PRN
  Administered 2015-09-17: 15 mL via OROMUCOSAL
  Filled 2015-09-15: qty 15

## 2015-09-15 MED ORDER — OXYCODONE HCL 5 MG PO TABS
10.0000 mg | ORAL_TABLET | ORAL | Status: DC | PRN
  Administered 2015-09-15 – 2015-09-17 (×4): 10 mg via ORAL
  Filled 2015-09-15 (×5): qty 2

## 2015-09-15 MED ORDER — FUROSEMIDE 10 MG/ML IJ SOLN
40.0000 mg | Freq: Once | INTRAMUSCULAR | Status: AC
Start: 2015-09-16 — End: 2015-09-16
  Administered 2015-09-16: 40 mg via INTRAVENOUS

## 2015-09-15 MED ORDER — FUROSEMIDE 10 MG/ML IJ SOLN
INTRAMUSCULAR | Status: AC
  Filled 2015-09-15: qty 4

## 2015-09-15 MED ORDER — GUAIFENESIN ER 600 MG PO TB12
600.0000 mg | ORAL_TABLET | Freq: Two times a day (BID) | ORAL | Status: DC
  Administered 2015-09-15 – 2015-09-17 (×5): 600 mg via ORAL
  Filled 2015-09-15 (×4): qty 1

## 2015-09-15 NOTE — Progress Notes (Addendum)
Progress Note    Vincent Mason  KPT:[id number] DOB: 1936/03/28  DOA: 09/03/2015 PCP: Lelon Huh, MD   Outpatient Specialists:   Dr. Vira Agar in [place], Gastroenterology    Brief Narrative:   Vincent Mason is an 80 y.o. male with a PMH of atrial fibrillation on Xarelto, chronic diastolic heart failure, pulmonary HTN, COPD unknown Stage, CKD Stage III, possible history of seizures who was admitted 09/03/15 after being found unresponsive at home. EMS reported he had unequal pupils with left larger than right. He was intubated on arrival to the ED due to acute respiratory failure. Wife reports several episodes of staring spells and several episodes of unresponsiveness that have lasted 5-10 minutes the past several months. He was admitted 12/2014 with similar presentation and thought to be having seizures at that time and started on Trileptal with neurology follow up. Patient transferred from [facility] on 09/09/15, continued to have persistent leukocytosis, abdominal pain. CT scan of the chest of the abdomen revealed patchy bilateral airspace disease and possible right and left ischemic colitis. Also developed C. difficile colitis for which he is being treated with oral vancomycin.  Assessment/Plan:   Principal Problem:   Acute respiratory failure with hypoxia (HCC) secondary to acute septic encephalopathy / HCAP Presented after being found unresponsive at home in acute respiratory failure, initially suspicious for possible seizure event in the setting of chronic opiate/benzodiazepine dependency. Intubated on admission. CT of the head negative for acute bleed. Neurology saw the patient consultation and felt presentation most consistent with septic encephalopathy. Extubated 09/05/15. Although initial chest x-ray showed significant infiltrates, these appeared to improve with diuresis and it was unclear if his presenting shock was from sepsis secondary to pneumonia. He did complete treatment with  Levaquin and vancomycin for presumed aspiration pneumonia. He underwent a CT scan of the chest on 09/10/15 which showed patchy bilateral airspace disease suggestive of bronchopneumonia and antibiotics were reinitiated with vancomycin and Zosyn to cover healthcare associated pathogens. Antibiotics were subsequently discontinued 09/11/15 and setting of C. difficile infection. Currently denies dyspnea, but continues to cough. Will hold antibiotics for now. Add Mucinex. Chest x-ray repeated 09/15/15 and showed resolving patchy bibasilar airspace disease.  Still having hypoxic episodes. Will  Order Duonebs Q 6 hours and give a dose of Lasix today.  Severe sepsis with Septic shock Required pressor support with stabilization of blood pressure on broad-spectrum antibiotics. Pressors were weaned by 09/07/15. No evidence of adrenal insufficiency. Source suspected to be from aspiration pneumonia versus UTI. Blood cultures and sputum cultures negative. Speech therapy evaluation performed, diet change to dysphagia 1 with nectar thickened liquids.  Active Problems:   Oral thrush Continue Diflucan 100 mg x 7 days, Magic mouthwash QID and viscous lidocaine when necessary.    Seizures ([place]) EEG done 09/03/15, no seizure focus identified.    Hyperlipidemia Crestor initially held secondary to transaminitis while in the hospital. LFTs now normalized. Crestor resumed.    Coronary artery disease Continue statin.    Acute on chronic renal failure/Chronic kidney disease (CKD), stage III (moderate)/acute tubular necrosis Had significant deterioration of renal function in the setting of hypotension and shock. Creatinine has improved from 5.25---> 1.84.    COPD (chronic obstructive pulmonary disease) (HCC) Stable.    Normocytic anemia/fecal occult blood test positive Thought to be from severely macerated perirectum, and multiple hematomas including a hip hematoma seen on CT scanning. Hemoglobin stable.    Opioid and  benzodiazepine dependence (HCC) Currently on Valium 2 mg  every 8 hours. Also has opiates ordered as needed.    Pulmonary arterial hypertension (HCC)    Status post peripherally inserted central catheter (PICC) central line placement    C. difficile colitis/abdominal pain Developed significant diarrhea and abdominal pain in the setting of recent broad-spectrum antibiotics. C. difficile testing sent and returned positive for/19/17. Placed on oral vancomycin with plans to complete a ten-day course.    Acute on chronic diastolic CHF (congestive heart failure), NYHA class 1 (HCC) Chest x-ray on 09/03/15 showed worsening changes of CHF with interstitial and alveolar edema. Diuresed. 2-D echo repeated 09/04/15 and showed EF 60-65 percent with moderate to severe pulmonary hypertension. Give a dose of Lasix today.    Chronic atrial fibrillation (HCC) Xarelto held on admission and the patient was treated with IV heparin. Heparin was subsequently discontinued in the setting of significant anemia and fecal occult positive blood in the stools. Given history of falls and coagulopathy, will need to have a discussion with the patient and his family regarding risk versus benefit of ongoing anticoagulation. Remains off Xarelto.    Essential hypertension Blood pressure stable off antihypertensives.    Apparent cirrhosis by CT GI in the process of evaluating with serologies. Hepatitis serologies negative. ANA was positive, anti-smooth muscle and antimitochondrial antibodies were negative.    Hypomagnesemia Repleted.   Family Communication/Anticipated D/C date and plan/Code Status   DVT prophylaxis: Lovenox ordered. Code Status: Full Code.  Family Communication: Wife, [name] called at [phone], updated by telephone. Disposition Plan: SNF when medically stable, and able to maintain hydration status given ongoing diarrhea.   Medical Consultants:     Gastroenterology  Neurology  Pulmonology   Procedures:    Intubation  EEG  2-D echo  Anti-Infectives:    Levaquin 4/11-4/16  Vancomycin 4/11-4/14  Restarted vanc and zosyn 4/19 but stopped on the same day  Oral vancomycin 4/19  Subjective:   Vincent Mason reports ongoing soreness in his mouth.  Breathing a bit labored today with upper airway rales, but not coughing as much.  Also reports leg pain.  Having difficult urinating.  .   Objective:    Filed Vitals:   09/14/15 1842 09/14/15 1859 09/14/15 2106 09/15/15 0559  BP:   127/56 118/46  Pulse:   106 96  Temp:   99.7 F (37.6 C) 98.3 F (36.8 C)  TempSrc:   Oral Oral  Resp:   30 19  Height:      Weight:    88.5 kg (195 lb 1.7 oz)  SpO2: 96% 96% 99% 92%    Intake/Output Summary (Last 24 hours) at 09/15/15 0810 Last data filed at 09/15/15 0647  Gross per 24 hour  Intake   1270 ml  Output      0 ml  Net   1270 ml   Filed Weights   09/13/15 0509 09/14/15 0642 09/15/15 0559  Weight: 89.404 kg (197 lb 1.6 oz) 88.769 kg (195 lb 11.2 oz) 88.5 kg (195 lb 1.7 oz)    Exam: General exam: Appears calm and comfortable with right IJ in place.  Oropharynx: Thrush on tongue. Respiratory system: Lungs with bilateral upper airway rales, wheeze.  Mildly labored. Cardiovascular system: S1 & S2 heard, slightly irregular. No JVD, murmurs, rubs, gallops or clicks. + pedal edema. Gastrointestinal system: Abdomen is mildly distended and nontender. No organomegaly or masses felt. Normal bowel sounds heard. Central nervous system: Alert and oriented to self and place. No focal neurological deficits. Extremities: Symmetric 4+ x  5 power. No clubbing or cyanosis. Skin: Venous stasis dermatitis with ulcerations/wounds to lower extremity. Psychiatry: Judgement and insight appear normal. Mood & affect appropriate.   Data Reviewed:   I have personally reviewed following labs and imaging studies:  Labs: Basic Metabolic  Panel:  Recent Labs Lab 09/09/15 1225  09/11/15 0205 09/12/15 0359 09/13/15 0355 09/13/15 1553 09/15/15 0420  NA 143  < > 139 139 137 137 138  K 2.7*  < > 3.5 3.3* 3.6 3.9 4.5  CL 103  < > 107 107 107 106 111  CO2 27  < > 22 20* 20* 20* 20*  GLUCOSE 121*  < > 110* 133* 98 96 100*  BUN 97*  < > 59* 41* 36* 35* 25*  CREATININE 3.10*  < > 2.02* 2.03* 1.92* 1.95* 1.84*  CALCIUM 8.3*  < > 7.8* 8.1* 7.8* 8.0* 7.9*  MG 2.1  --   --   --   --  1.5* 1.7  < > = values in this interval not displayed. GFR Estimated Creatinine Clearance: 35.8 mL/min (by C-G formula based on Cr of 1.84). Liver Function Tests:  Recent Labs Lab 09/09/15 0214 09/10/15 0700 09/11/15 0205 09/12/15 0359  AST 45* 53* 42* 31  ALT 31 35 32 30  ALKPHOS 60 62 52 57  BILITOT 1.2 1.3* 1.3* 1.1  PROT 5.6* 5.7* 5.4* 5.8*  ALBUMIN 2.6* 2.7* 2.6* 2.7*   Coagulation profile  Recent Labs Lab 09/11/15 1910  INR 1.46    CBC:  Recent Labs Lab 09/11/15 1910 09/12/15 0359 09/13/15 0355 09/14/15 0444 09/15/15 0420  WBC 23.0* 25.8* 20.2* 15.5* 14.7*  NEUTROABS  --  24.0* 18.3* 13.3*  --   HGB 7.5* 8.2* 7.5* 7.4* 7.5*  HCT 24.6* 27.3* 24.6* 24.6* 24.5*  MCV 90.8 91.6 90.4 91.4 91.1  PLT 175 212 201 222 252   CBG:  Recent Labs Lab 09/08/15 1252 09/08/15 1638 09/08/15 1959 09/09/15 0003  GLUCAP 126* 149* 171* 109*   Sepsis Labs:  Recent Labs Lab 09/12/15 0359 09/13/15 0355 09/14/15 0444 09/14/15 1040 09/14/15 1430 09/15/15 0420  WBC 25.8* 20.2* 15.5*  --   --  14.7*  LATICACIDVEN  --   --   --  1.9 1.4  --    Urine analysis:    Component Value Date/Time   COLORURINE YELLOW 09/03/2015 0346   COLORURINE Yellow 06/30/2014 0729   APPEARANCEUR TURBID* 09/03/2015 0346   APPEARANCEUR Clear 06/30/2014 0729   LABSPEC 1.013 09/03/2015 0346   LABSPEC 1.016 06/30/2014 0729   PHURINE 5.5 09/03/2015 0346   PHURINE 7.0 06/30/2014 0729   GLUCOSEU NEGATIVE 09/03/2015 0346   GLUCOSEU Negative  06/30/2014 0729   HGBUR MODERATE* 09/03/2015 0346   HGBUR 1+ 06/30/2014 0729   BILIRUBINUR NEGATIVE 09/03/2015 0346   BILIRUBINUR Negative 06/30/2014 0729   KETONESUR NEGATIVE 09/03/2015 0346   KETONESUR Negative 06/30/2014 0729   PROTEINUR >300* 09/03/2015 0346   PROTEINUR 30 mg/dL 06/30/2014 0729   NITRITE NEGATIVE 09/03/2015 0346   NITRITE Negative 06/30/2014 0729   LEUKOCYTESUR SMALL* 09/03/2015 0346   LEUKOCYTESUR Negative 06/30/2014 0729   Microbiology Recent Results (from the past 240 hour(s))  C difficile quick scan w PCR reflex     Status: Abnormal   Collection Time: 09/11/15  3:36 PM  Result Value Ref Range Status   C Diff antigen POSITIVE (A) NEGATIVE Final   C Diff toxin POSITIVE (A) NEGATIVE Final   C Diff interpretation Positive for toxigenic C. difficile  Final    Comment: CRITICAL RESULT CALLED TO, READ BACK BY AND VERIFIED WITH: GLOSSON RN 16:30 09/11/15 ([email or web address])     Radiology: Dg Swallowing Func-speech Pathology  09/14/2015  Objective Swallowing Evaluation: Type of Study: MBS-Modified Barium Swallow Study Patient Details Name: Vincent Mason MRN: [id number] Date of Birth: 1935-12-23 Today's Date: 09/14/2015 Time: SLP Start Time (ACUTE ONLY): 1237-SLP Stop Time (ACUTE ONLY): 1255 SLP Time Calculation (min) (ACUTE ONLY): 18 min Past Medical History: Past Medical History Diagnosis Date . Hypertension  . Hyperlipidemia  . COPD (chronic obstructive pulmonary disease) ([place])  . Kidney disease, chronic, stage III (moderate, EGFR 30-59 ml/min)    ARF 2013 . Atrial fibrillation and flutter [facility]) 2013   Dr Nehemiah Massed at [facility]  on [place].  . OSA (obstructive sleep apnea)    non-compliant with CPAP.  Past Surgical History: Past Surgical History Procedure Laterality Date . Hyperplastic colon polyp  02/2002   Sigmoid polyps . Coronary artery bypass graft  10/1997 . History of cervical discectomy  2009   C34-C40 HPI: 80 year old male found unresponsive at home with acute respiratory failure,  acute renal failure and acute anemia. History of possible seizure activity, chronic opioid/bzd dependence. Intubated from 4/11 to 4/13.  Subjective: The patient was seen in radiology for MBS to determine safest PO diet.  Assessment / Plan / Recommendation CHL IP CLINICAL IMPRESSIONS 09/14/2015 Therapy Diagnosis Mild oral phase dysphagia;Moderate pharyngeal phase dysphagia Clinical Impression MBSS was completed.  The patient presented with mild oral and moderate pharyngeal dysphagia characterized by decreased bolus cohesion, delayed oral transit, premature spillage, delayed swallow trigger, decreased pharyngeal squeeze leading to mild vallecular residue.  Silent penetration during the swallow was seen given self fed cup sips of thin.  Chin tuck prevented this from occurring.  The patient refused any solids due to lingual pain.  Esophageal sweep revealed it slow to clear.  Recommend a dysphagia 1 diet with nectar thick liquids.  Can consider thin liquids given full supervision to ensure use of a chin tuck.  ST will follow up for therapeutic diet tolerance, education and possible diet advancement.  The patient will need follow up at the next level of care.    Impact on safety and function Mild aspiration risk   CHL IP TREATMENT RECOMMENDATION 09/14/2015 Treatment Recommendations Therapy as outlined in treatment plan below   Prognosis 09/14/2015 Prognosis for Safe Diet Advancement Fair Barriers to Reach Goals -- Barriers/Prognosis Comment -- CHL IP DIET RECOMMENDATION 09/14/2015 SLP Diet Recommendations Dysphagia 1 (Puree) solids;Nectar thick liquid Liquid Administration via Cup;No straw Medication Administration Crushed with puree Compensations Slow rate;Small sips/bites Postural Changes Remain semi-upright after after feeds/meals (Comment);Seated upright at 90 degrees   CHL IP OTHER RECOMMENDATIONS 09/14/2015 Recommended Consults -- Oral Care Recommendations Oral care BID Other Recommendations Order thickener from  pharmacy;Have oral suction available   CHL IP FOLLOW UP RECOMMENDATIONS 09/14/2015 Follow up Recommendations Other (comment)   CHL IP FREQUENCY AND DURATION 09/14/2015 Speech Therapy Frequency (ACUTE ONLY) min 2x/week Treatment Duration 2 weeks      CHL IP ORAL PHASE 09/14/2015 Oral Phase Impaired Oral - Pudding Teaspoon -- Oral - Pudding Cup -- Oral - Honey Teaspoon -- Oral - Honey Cup -- Oral - Nectar Teaspoon -- Oral - Nectar Cup Decreased bolus cohesion;Premature spillage Oral - Nectar Straw -- Oral - Thin Teaspoon Decreased bolus cohesion;Premature spillage Oral - Thin Cup Decreased bolus cohesion;Premature spillage Oral - Thin Straw -- Oral - Puree Delayed oral transit;Decreased bolus cohesion;Premature spillage  Oral - Mech Soft -- Oral - Regular -- Oral - Multi-Consistency -- Oral - Pill -- Oral Phase - Comment --  CHL IP PHARYNGEAL PHASE 09/14/2015 Pharyngeal Phase Impaired Pharyngeal- Pudding Teaspoon -- Pharyngeal -- Pharyngeal- Pudding Cup -- Pharyngeal -- Pharyngeal- Honey Teaspoon -- Pharyngeal -- Pharyngeal- Honey Cup -- Pharyngeal -- Pharyngeal- Nectar Teaspoon -- Pharyngeal -- Pharyngeal- Nectar Cup Delayed swallow initiation-vallecula;Pharyngeal residue - valleculae Pharyngeal -- Pharyngeal- Nectar Straw -- Pharyngeal -- Pharyngeal- Thin Teaspoon Delayed swallow initiation-pyriform sinuses;Pharyngeal residue - valleculae;Reduced pharyngeal peristalsis Pharyngeal -- Pharyngeal- Thin Cup Delayed swallow initiation-pyriform sinuses;Pharyngeal residue - valleculae;Penetration/Aspiration during swallow;Reduced pharyngeal peristalsis Pharyngeal Material enters airway, remains ABOVE vocal cords and not ejected out Pharyngeal- Thin Straw -- Pharyngeal -- Pharyngeal- Puree Delayed swallow initiation-vallecula;Pharyngeal residue - valleculae;Reduced pharyngeal peristalsis Pharyngeal -- Pharyngeal- Mechanical Soft -- Pharyngeal -- Pharyngeal- Regular -- Pharyngeal -- Pharyngeal- Multi-consistency -- Pharyngeal --  Pharyngeal- Pill -- Pharyngeal -- Pharyngeal Comment --  CHL IP CERVICAL ESOPHAGEAL PHASE 09/14/2015 Cervical Esophageal Phase WFL Pudding Teaspoon -- Pudding Cup -- Honey Teaspoon -- Honey Cup -- Nectar Teaspoon -- Nectar Cup -- Nectar Straw -- Thin Teaspoon -- Thin Cup -- Thin Straw -- Puree -- Mechanical Soft -- Regular -- Multi-consistency -- Pill -- Cervical Esophageal Comment -- CHL IP GO 09/14/2015 Functional Assessment Tool Used ASHA NOMS and clinical judgment.   Functional Limitations Swallowing Swallow Current Status [phone]) CK Swallow Goal Status (N8295) CK Swallow Discharge Status [phone]) (None) Motor Speech Current Status [phone]) (None) Motor Speech Goal Status [phone]) (None) Motor Speech Goal Status [phone]) (None) Spoken Language Comprehension Current Status [phone]) (None) Spoken Language Comprehension Goal Status (W1027) (None) Spoken Language Comprehension Discharge Status [phone]) (None) Spoken Language Expression Current Status [phone]) (None) Spoken Language Expression Goal Status [phone]) (None) Spoken Language Expression Discharge Status [phone]) (None) Attention Current Status (F6433) (None) Attention Goal Status (I9518) (None) Attention Discharge Status [phone]) (None) Memory Current Status (A6301) (None) Memory Goal Status (S0109) (None) Memory Discharge Status (N2355) (None) Voice Current Status (D3220) (None) Voice Goal Status (U5427) (None) Voice Discharge Status [phone]) (None) Other Speech-Language Pathology Functional Limitation [phone]) (None) Other Speech-Language Pathology Functional Limitation Goal Status (D1761) (None) Other Speech-Language Pathology Functional Limitation Discharge Status [phone]) (None) Shelly Flatten, MA, CCC-SLP Acute Rehab SLP [phone] Lamar Sprinkles 09/14/2015, 1:12 PM               Medications:   . diazepam  2 mg Oral Q8H  . digoxin  0.0625 mg Oral Daily  . enoxaparin (LOVENOX) injection  40 mg Subcutaneous Q24H  . famotidine  20 mg Oral Daily  .  febuxostat  80 mg Oral Daily  . feeding supplement (ENSURE ENLIVE)  237 mL Oral BID BM  . fluconazole  100 mg Oral Daily  . gabapentin  100 mg Oral BID  . hydrocortisone-pramoxine  1 applicator Rectal BID  . magic mouthwash w/lidocaine  10 mL Oral QID  . metronidazole  500 mg Intravenous Q8H  . OXcarbazepine  150 mg Oral BID  . potassium chloride  60 mEq Oral BID  . rOPINIRole  2 mg Oral QHS  . rosuvastatin  40 mg Oral q1800  . saccharomyces boulardii  250 mg Oral BID  . sodium chloride flush  10-40 mL Intracatheter Q12H  . vancomycin  125 mg Oral Q6H   Continuous Infusions:   Time spent: 35 minutes with > 50% of time discussing current diagnostic test results, clinical impression and plan of care with the patient, who has a lot  of anxiety and needs repeated explanations regarding his medical care.    LOS: 12 days   [facility] Pager [phone]. If unable to reach me by pager, please call my cell phone at [phone].  *Please refer to [email or web address], password [email or web address] to get updated schedule on who will round on this patient, as hospitalists switch teams weekly. If 7PM-7AM, please contact night-coverage at [email or web address], password [email or web address] for any overnight needs.  09/15/2015, 8:10 AM

## 2015-09-16 ENCOUNTER — Inpatient Hospital Stay (HOSPITAL_COMMUNITY): Payer: Medicare Other

## 2015-09-16 MED ORDER — IPRATROPIUM-ALBUTEROL 0.5-2.5 (3) MG/3ML IN SOLN
3.0000 mL | Freq: Four times a day (QID) | RESPIRATORY_TRACT | Status: DC
  Administered 2015-09-16 – 2015-09-17 (×5): 3 mL via RESPIRATORY_TRACT
  Filled 2015-09-16 (×5): qty 3

## 2015-09-16 MED ORDER — FUROSEMIDE 10 MG/ML IJ SOLN
40.0000 mg | Freq: Once | INTRAMUSCULAR | Status: AC
  Administered 2015-09-16: 40 mg via INTRAVENOUS
  Filled 2015-09-16: qty 4

## 2015-09-16 NOTE — Progress Notes (Signed)
Physical Therapy Treatment Patient Details Name: Vincent Mason MRN: [id number] DOB: 1935/07/09 Today's Date: 09/16/2015    History of Present Illness pt presents with Encephalopathy, Respiratory Failure, and Renal Failure.  pt intubated 4/11-4/13.  pt with hx of A-fib, HF, Pulmonary HTN, COPD, CKD, and Seizures.      PT Comments    Pt progressing towards all goals. Pt con't to remain incontinent of urine and stool and very deconditioned. Pt was able to tolerate increased ambulation this date. con't to recommend SNF upon d/c to address below deficits.  Follow Up Recommendations  SNF;Supervision/Assistance - 24 hour     Equipment Recommendations  Rolling walker with 5" wheels;3in1 (PT)    Recommendations for Other Services Rehab consult     Precautions / Restrictions Precautions Precautions: Fall Precaution Comments: frequent urinary and stool incontinence Restrictions Weight Bearing Restrictions: No    Mobility  Bed Mobility Overal bed mobility: Needs Assistance Bed Mobility: Rolling;Sidelying to Sit Rolling: Min assist Sidelying to sit: Mod assist       General bed mobility comments: max directional v/c's, increased time, assist for trunk elevation  Transfers Overall transfer level: Needs assistance Equipment used: Rolling walker (2 wheeled) Transfers: Sit to/from Stand Sit to Stand: Min assist;+2 safety/equipment         General transfer comment: increased time, v/c's for hand placement  Ambulation/Gait Ambulation/Gait assistance: Min assist;+2 safety/equipment (2nd person for lines and chair follow) Ambulation Distance (Feet): 60 Feet Assistive device: Rolling walker (2 wheeled) Gait Pattern/deviations: Step-through pattern;Decreased stride length;Trunk flexed Gait velocity: decreased Gait velocity interpretation: Below normal speed for age/gender General Gait Details: v/c's to stand upright, decreased step height   Stairs            Wheelchair  Mobility    Modified Rankin (Stroke Patients Only)       Balance Overall balance assessment: Needs assistance Sitting-balance support: Bilateral upper extremity supported Sitting balance-Leahy Scale: Poor Sitting balance - Comments: requires 1 UE support   Standing balance support: Bilateral upper extremity supported Standing balance-Leahy Scale: Poor Standing balance comment: reliws on UEs due to bilat LE soreness                    Cognition Arousal/Alertness: Awake/alert Behavior During Therapy: WFL for tasks assessed/performed Overall Cognitive Status: Within Functional Limits for tasks assessed                      Exercises      General Comments General comments (skin integrity, edema, etc.): pt found soiled in urine and loose stool. pt dependent for hygiene. pt buttocks raw from freq stools      Pertinent Vitals/Pain Pain Assessment: Faces Faces Pain Scale: Hurts whole lot Pain Location: buttocks, raw from freq stools Pain Descriptors / Indicators: Sore Pain Intervention(s): Monitored during session    Home Living                      Prior Function            PT Goals (current goals can now be found in the care plan section) Acute Rehab PT Goals Patient Stated Goal: none stated Progress towards PT goals: Progressing toward goals    Frequency  Min 3X/week    PT Plan Current plan remains appropriate    Co-evaluation             End of Session Equipment Utilized During Treatment: Gait belt Activity Tolerance: Patient  limited by fatigue Patient left: in chair;with call bell/phone within reach (with SLP)     Time: [id number] PT Time Calculation (min) (ACUTE ONLY): 29 min  Charges:  $Gait Training: 8-22 mins $Therapeutic Activity: 8-22 mins                    G Codes:      Kingsley Callander 09/16/2015, 11:06 AM   Kittie Plater, PT, DPT Pager #: [phone] Office #: [phone]

## 2015-09-16 NOTE — Care Management Important Message (Signed)
Important Message  Patient Details  Name: Vincent Mason MRN: [id number] Date of Birth: 1936/01/23   Medicare Important Message Given:  Yes    Carles Collet, RN 09/16/2015, 11:37 AMImportant Message  Patient Details  Name: Vincent Mason MRN: [id number] Date of Birth: [birth year]   Medicare Important Message Given:  Yes    Carles Collet, RN 09/16/2015, 11:37 AM

## 2015-09-16 NOTE — Progress Notes (Signed)
Speech Language Pathology Treatment: Dysphagia  Patient Details Name: Vincent Mason MRN: [id number] DOB: 26-Feb-1936 Today's Date: 09/16/2015 Time: 1000-1045 SLP Time Calculation (min) (ACUTE ONLY): 45 min  Assessment / Plan / Recommendation Clinical Impression  Pt demonstrates no intellectual awareness of difficulty swallowing despite explanation and cues to observe coughing with liquids. Pt is able to complete a chin tuck throughout observation with straw sips of nectar thick liquids, though intermittent coughing noted, perhaps unrelated to PO intake. Pt complains about pureed diet, but also is unwilling to attempt solids due to "burning" on his tongue. Recommend pt continue nectar thick liquids until he demonstrates independent chin tuck. Will upgrade to dys 2 diet and will follow for tolerance and further teaching.    HPI HPI: 80 year old male found unresponsive at home with acute respiratory failure, acute renal failure and acute anemia. History of possible seizure activity, chronic opioid/bzd dependence. Intubated from 4/11 to 4/13.       SLP Plan  Continue with current plan of care     Recommendations  Diet recommendations: Dysphagia 2 (fine chop);Nectar-thick liquid Liquids provided via: Cup;Straw Medication Administration: Whole meds with puree Supervision: Patient able to self feed;Intermittent supervision to cue for compensatory strategies Compensations: Slow rate;Small sips/bites Postural Changes and/or Swallow Maneuvers: Seated upright 90 degrees;Upright 30-60 min after meal             Oral Care Recommendations: Oral care BID Plan: Continue with current plan of care     GO               [facility], MA CCC-SLP [id number]  Lynann Beaver 09/16/2015, 1:34 PM

## 2015-09-16 NOTE — Progress Notes (Signed)
Pt called that he was having SOB assessed his lungs and was  wheezing his O2 88 put him on oxygen gave him his PRN breathing treatment, vital signs checked paged physician on call, and rapid respond team, came over to checked up pt, ordered  Treatment was carried out pt verbalised of felling better after couple of hours after treatment given, will continue to monitor

## 2015-09-16 NOTE — Progress Notes (Signed)
Nutrition Follow-up  DOCUMENTATION CODES:   Not applicable  INTERVENTION:   -D/c Ensure Enlive po BID, each supplement provides 350 kcal and 20 grams of protein -Magic Cup TID with meals  NUTRITION DIAGNOSIS:   Inadequate oral intake related to poor appetite as evidenced by meal completion < 50%.  Progressing  GOAL:   Patient will meet greater than or equal to 90% of their needs  Progressing  MONITOR:   PO intake, Supplement acceptance, Labs, Weight trends, I & O's  REASON FOR ASSESSMENT:   Consult Enteral/tube feeding initiation and management  ASSESSMENT:   Pt with a past medical history of A. Fib on Xarelto, Chronic Diastolic Heart Failure, Pulmonary HTN, COPD Unknown Stage, CKD Stage III, possible history of seizures presents to the ED after being found unresponsive at home.  Pt sleeping soundly in recliner at time of visit. RD did not wake.   Pt underwent repeat BSE on 09/14/15 and was downgraded to dysphagia 1 diet with nectar thick liquids secondary to complains of lingual pain. Pt underwent MBSS on 09/14/15 and recommended continue dysphagia 1 diet with nectar thick liquids. Pt was upgraded to a dysphagia 2 consistencies this afternoon.   Noted cup of Ensure at bedside; pt consumed less than 25%. RD will d/c due to increased meal completion and diet restrictions. RD will order Magic Cup to optimize nutritional status.   CSW following. Plan to d/c to [facility] once medically stable.   Labs reviewed.   Diet Order:  DIET DYS 2 Room service appropriate?: Yes; Fluid consistency:: Nectar Thick  Skin:  Wound (see comment) (Patchy areas of partial thickness stasis ulcers to bilat anterior calves)  Last BM:  09/16/15  Height:   Ht Readings from Last 1 Encounters:  09/03/15 5\' 8"  (1.727 m)    Weight:   Wt Readings from Last 1 Encounters:  09/15/15 195 lb 1.7 oz (88.5 kg)    Ideal Body Weight:  70 kg  BMI:  Body mass index is 29.67  kg/(m^2).  Estimated Nutritional Needs:   Kcal:  1800-2000  Protein:  90-100 gm  Fluid:  1.8-2.0 L  EDUCATION NEEDS:   No education needs identified at this time  Isatu Macinnes A. Jimmye Norman, RD, LDN, CDE Pager: [phone] After hours Pager: [phone]

## 2015-09-16 NOTE — Care Management Important Message (Signed)
Important Message  Patient Details  Name: Vincent Mason MRN: [id number] Date of Birth: [birth year]   Medicare Important Message Given:  Yes    Carles Collet, RN 09/16/2015, 11:37 AM

## 2015-09-17 MED ORDER — LIDOCAINE VISCOUS 2 % MT SOLN: 15.0000 mL | OROMUCOSAL | Status: DC | PRN

## 2015-09-17 MED ORDER — DIGOXIN 62.5 MCG PO TABS: 0.0625 mg | ORAL_TABLET | Freq: Every day | ORAL | Status: DC

## 2015-09-17 MED ORDER — FAMOTIDINE 20 MG PO TABS: 20.0000 mg | ORAL_TABLET | Freq: Every day | ORAL | Status: DC

## 2015-09-17 MED ORDER — GABAPENTIN 100 MG PO CAPS: 100.0000 mg | ORAL_CAPSULE | Freq: Three times a day (TID) | ORAL | Status: DC

## 2015-09-17 MED ORDER — IPRATROPIUM-ALBUTEROL 0.5-2.5 (3) MG/3ML IN SOLN: 3.0000 mL | Freq: Four times a day (QID) | RESPIRATORY_TRACT | Status: DC | PRN

## 2015-09-17 MED ORDER — OXYCODONE HCL 15 MG PO TABS: 15.0000 mg | ORAL_TABLET | ORAL | Status: DC | PRN

## 2015-09-17 MED ORDER — RESOURCE THICKENUP CLEAR PO POWD: ORAL | Status: DC

## 2015-09-17 MED ORDER — FUROSEMIDE 40 MG PO TABS: 40.0000 mg | ORAL_TABLET | Freq: Once | ORAL | Status: DC

## 2015-09-17 MED ORDER — DIAZEPAM 2 MG PO TABS: 2.0000 mg | ORAL_TABLET | Freq: Three times a day (TID) | ORAL | Status: DC | PRN

## 2015-09-17 MED ORDER — METOLAZONE 10 MG PO TABS
10.0000 mg | ORAL_TABLET | Freq: Every day | ORAL | Status: DC
  Filled 2015-09-17: qty 1

## 2015-09-17 MED ORDER — FLUCONAZOLE 40 MG/ML PO SUSR: 100.0000 mg | Freq: Every day | ORAL | Status: AC

## 2015-09-17 MED ORDER — HYDROCORTISONE ACE-PRAMOXINE 1-1 % RE FOAM: 1.0000 | Freq: Two times a day (BID) | RECTAL | Status: DC

## 2015-09-17 MED ORDER — TORSEMIDE 20 MG PO TABS: 40.0000 mg | ORAL_TABLET | Freq: Two times a day (BID) | ORAL | Status: DC

## 2015-09-17 MED ORDER — ZOLPIDEM TARTRATE 5 MG PO TABS: 5.0000 mg | ORAL_TABLET | Freq: Every day | ORAL | Status: DC

## 2015-09-17 MED ORDER — VANCOMYCIN 50 MG/ML ORAL SOLUTION: 125.0000 mg | Freq: Four times a day (QID) | ORAL | Status: AC

## 2015-09-17 NOTE — Progress Notes (Signed)
Patient will discharge to [facility] Anticipated discharge date:4/25 Family notified: pt wife Transportation by [employer]- scheduled for 12:30pm  CSW signing off.  Domenica Reamer, [place] Social Worker [phone]

## 2015-09-17 NOTE — Progress Notes (Addendum)
Nsg Discharge Note  Admit Date:  09/03/2015 Discharge date: 09/17/2015   Vincent Mason to be D/C'd Nursing Home per MD order.  AVS completed.  Copy for chart, and copy for patient signed, and dated. Patient/caregiver able to verbalize understanding.  Discharge Medication:   Medication List    STOP taking these medications        meclizine 25 MG tablet  Commonly known as:  ANTIVERT     metoprolol succinate 100 MG 24 hr tablet  Commonly known as:  TOPROL-XL     sildenafil 20 MG tablet  Commonly known as:  REVATIO     XARELTO 15 MG Tabs tablet  Generic drug:  Rivaroxaban      TAKE these medications        benazepril 20 MG tablet  Commonly known as:  LOTENSIN  Take 1 tablet by mouth daily. Reported on 07/08/2015     colchicine 0.6 MG tablet  Take 1 tablet (0.6 mg total) by mouth daily as needed (for gout).     CoQ10 200 MG Caps  Take 1 capsule by mouth daily.     diazepam 2 MG tablet  Commonly known as:  VALIUM  Take 1 tablet (2 mg total) by mouth every 8 (eight) hours as needed for anxiety. Reported on 07/08/2015     Digoxin 62.5 MCG Tabs  Commonly known as:  DIGOX  Take 0.0625 mg by mouth daily.     famotidine 20 MG tablet  Commonly known as:  PEPCID  Take 1 tablet (20 mg total) by mouth daily.     Febuxostat 80 MG Tabs  Commonly known as:  ULORIC  Take 1 tablet (80 mg total) by mouth daily.     fluconazole 40 MG/ML suspension  Commonly known as:  DIFLUCAN  Take 2.5 mLs (100 mg total) by mouth daily.     gabapentin 100 MG capsule  Commonly known as:  NEURONTIN  Take 1 capsule (100 mg total) by mouth 3 (three) times daily.     hydrocortisone-pramoxine rectal foam  Commonly known as:  PROCTOFOAM-HC  Place 1 applicator rectally 2 (two) times daily.     ipratropium-albuterol 0.5-2.5 (3) MG/3ML Soln  Commonly known as:  DUONEB  Take 3 mLs by nebulization every 6 (six) hours as needed.     lidocaine 2 % solution  Commonly known as:  XYLOCAINE  Use as  directed 15 mLs in the mouth or throat every 3 (three) hours as needed for mouth pain.     metolazone 5 MG tablet  Commonly known as:  ZAROXOLYN  Take 2 tablets by mouth daily.     OXcarbazepine 150 MG tablet  Commonly known as:  TRILEPTAL  Take 1 tablet (150 mg total) by mouth 2 (two) times daily. Prescribed by Dr. Melrose Nakayama     oxyCODONE 15 MG immediate release tablet  Commonly known as:  ROXICODONE  Take 1 tablet (15 mg total) by mouth every 4 (four) hours as needed for pain.     potassium chloride SA 20 MEQ tablet  Commonly known as:  K-DUR,KLOR-CON  Take 2 tablets (40 mEq total) by mouth 2 (two) times daily.     ranitidine 150 MG tablet  Commonly known as:  ZANTAC  Take 1 tablet by mouth 2 (two) times daily as needed. As needed for stomach burning     RESOURCE THICKENUP CLEAR Powd  Nectar thicken consistency     rOPINIRole 4 MG tablet  Commonly known as:  REQUIP  TAKE ONE TABLET BY MOUTH EVERY NIGHT AT BEDTIME FOR RESTLESS LEG SYNDROME     rosuvastatin 40 MG tablet  Commonly known as:  CRESTOR  TAKE ONE (1) TABLET EACH DAY *GENERIC CRESTOR     torsemide 20 MG tablet  Commonly known as:  DEMADEX  2 tablets once or twice a day for swelling     vancomycin 50 mg/mL oral solution  Commonly known as:  VANCOCIN  Take 2.5 mLs (125 mg total) by mouth every 6 (six) hours.     zolpidem 5 MG tablet  Commonly known as:  AMBIEN  Take 1 tablet (5 mg total) by mouth at bedtime. Reported on 07/08/2015        Discharge Assessment: Filed Vitals:   09/16/15 1505 09/16/15 2130  BP: 122/49 118/53  Pulse: 92 90  Temp: 98.2 F (36.8 C) 98.3 F (36.8 C)  Resp: 19 20   Skin- Stage one noted to sacrum, MASD noted around groin area as well. Patient straight cathed for 400cc of urine.  IV catheter discontinued intact. Site without signs and symptoms of complications - no redness or edema noted at insertion site, patient denies c/o pain - only slight tenderness at site. Also, bruising  noted to legs, generalized. D/c Instructions-Education: Discharge instructions given to patient/family with verbalized understanding. D/c education completed with patient/family including follow up instructions, medication list, d/c activities limitations if indicated, with other d/c instructions as indicated by MD - patient able to verbalize understanding, all questions fully answered. Patient instructed to return to ED, call 911, or call MD for any changes in condition.  Patient escorted via EMS to [place] given to [facility]. Dayle Points, RN 09/17/2015 1:34 PM

## 2015-09-17 NOTE — Clinical Social Work Placement (Signed)
   CLINICAL SOCIAL WORK PLACEMENT  NOTE  Date:  09/17/2015  Patient Details  Name: KAIYAN LAING MRN: [id number] Date of Birth: [birth year]  Clinical Social Work is seeking post-discharge placement for this patient at the [place] level of care (*CSW will initial, date and re-position this form in  chart as items are completed):  Yes   Patient/family provided with [place] Work Department's list of facilities offering this level of care within the geographic area requested by the patient (or if unable, by the patient's family).  Yes   Patient/family informed of their freedom to choose among providers that offer the needed level of care, that participate in Medicare, Medicaid or managed care program needed by the patient, have an available bed and are willing to accept the patient.  Yes   Patient/family informed of [facility]'s ownership interest in [facility] and [facility], as well as of the fact that they are under no obligation to receive care at these facilities.  PASRR submitted to EDS on 09/09/15     PASRR number received on 09/09/15     Existing PASRR number confirmed on       FL2 transmitted to all facilities in geographic area requested by pt/family on 09/09/15     FL2 transmitted to all facilities within larger geographic area on       Patient informed that his/her managed care company has contracts with or will negotiate with certain facilities, including the following:        Yes   Patient/family informed of bed offers received.  Patient chooses bed at [facility]     Physician recommends and patient chooses bed at      Patient to be transferred to [facility] on 09/17/15.  Patient to be transferred to facility by ptar     Patient family notified on 09/17/15 of transfer.  Name of family member notified:  Mrs. Bartholomew     PHYSICIAN Please sign FL2     Additional Comment:     _______________________________________________ Cranford Mon, LCSW 09/17/2015, 12:08 PM

## 2015-09-17 NOTE — Discharge Summary (Signed)
Physician Discharge Summary  Vincent Mason TLX:[id number] DOB: December 04, 1935 DOA: 09/03/2015  PCP: Lelon Huh, MD  Admit date: 09/03/2015 Discharge date: 09/17/2015   Recommendations for Outpatient Follow-Up:   1. Monitor renal function closely, ACE-I resumed.  Baseline creatinine is 1.6.  2. The patient has been having some issues with urinary retention.  Bladder scan and I&O cath Q 6 hours PRN. 3. Xarelto discontinued due to high fall risk and anemia with heme + stools.     Discharge Diagnosis:   Principal Problem:    Acute respiratory failure with hypoxia (HCC) Active Problems:    Seizures (HCC)    Hyperlipidemia    Coronary artery disease    Chronic kidney disease (CKD), stage III (moderate)    COPD (chronic obstructive pulmonary disease) (HCC)    Acute on chronic renal failure (HCC)    Normocytic anemia    Opioid dependence ([place])    Pulmonary arterial hypertension (HCC)    Acute encephalopathy    Status post peripherally inserted central catheter (PICC) central line placement    Abdominal pain    HCAP (healthcare-associated pneumonia)    C. difficile colitis    Acute on chronic diastolic CHF (congestive heart failure), NYHA class 1 (HCC)    Severe sepsis with septic shock (HCC)    Chronic atrial fibrillation ([place])    Essential hypertension    ATN (acute tubular necrosis) (HCC)    Fecal occult blood test positive    Hepatic cirrhosis (HCC)    Hypomagnesemia    Oral thrush    Probable aspiration pneumonia    Dysphagia   Discharge disposition:  SNF  Discharge Condition: Improved.  Diet recommendation: Low sodium, heart healthy.     History of Present Illness:   Vincent Mason is an 80 y.o. male with a PMH of atrial fibrillation on Xarelto, chronic diastolic heart failure, pulmonary HTN, COPD unknown Stage, CKD Stage III, possible history of seizures who was admitted 09/03/15 after being found unresponsive at home. EMS reported he had  unequal pupils with left larger than right. He was intubated on arrival to the ED due to acute respiratory failure. Wife reports several episodes of staring spells and several episodes of unresponsiveness that have lasted 5-10 minutes the past several months. He was admitted 12/2014 with similar presentation and thought to be having seizures at that time and started on Trileptal with neurology follow up. Patient transferred from [facility] on 09/09/15, continued to have persistent leukocytosis, abdominal pain. CT scan of the chest of the abdomen revealed patchy bilateral airspace disease and possible right and left ischemic colitis. Also developed C. difficile colitis for which he is being treated with oral vancomycin.   Hospital Course by Problem:   Principal Problem:  Acute respiratory failure with hypoxia (HCC) secondary to acute septic encephalopathy / HCAP Presented after being found unresponsive at home in acute respiratory failure, initially suspicious for possible seizure event in the setting of chronic opiate/benzodiazepine dependency. Intubated on admission. CT of the head negative for acute bleed. Neurology saw the patient consultation and felt presentation most consistent with septic encephalopathy. Extubated 09/05/15. Although initial chest x-ray showed significant infiltrates, these appeared to improve with diuresis and it was unclear if his presenting shock was from sepsis secondary to pneumonia. He did complete treatment with Levaquin and vancomycin for presumed aspiration pneumonia. He underwent a CT scan of the chest on 09/10/15 which showed patchy bilateral airspace disease suggestive of bronchopneumonia and antibiotics were reinitiated with vancomycin and  Zosyn to cover healthcare associated pathogens. Antibiotics were subsequently discontinued 09/11/15 and setting of C. difficile infection. Currently denies dyspnea, but continues to cough. Will hold antibiotics for now. Add Mucinex. Chest x-ray  repeated 09/15/15 and showed resolving patchy bibasilar airspace disease. Was hypoxic recently and Duonebs Q 6 hours were ordered and he was given a dose of Lasix 09/16/15. Resume Demadex and Xaroxoyln.  Severe sepsis with Septic shock likely from aspiration pneumonia in the setting of dysphagia Resolved. Required pressor support with stabilization of blood pressure on broad-spectrum antibiotics. Pressors were weaned by 09/07/15. No evidence of adrenal insufficiency. Source suspected to be from aspiration pneumonia versus UTI. Blood cultures and sputum cultures negative. Speech therapy evaluation performed, diet change to dysphagia 1 with nectar thickened liquids.  Active Problems:  Oral thrush Continue Diflucan 100 mg x 7 days, Magic mouthwash QID and viscous lidocaine when necessary. Will complete Diflucan on 09/21/15.   Seizures ([place]) EEG done 09/03/15, no seizure focus identified.   Hyperlipidemia Crestor initially held secondary to transaminitis while in the hospital. LFTs now normalized. Crestor resumed.   Coronary artery disease Continue statin. Beta blocker remains on hold, but can be resumed when blood pressure able to tolerate.   Acute on chronic renal failure/Chronic kidney disease (CKD), stage III (moderate)/acute tubular necrosis Had significant deterioration of renal function in the setting of hypotension and shock. Creatinine has improved from 5.25---> 1.84. Monitor with resumption of ACE-I.   COPD (chronic obstructive pulmonary disease) (HCC)/ Pulmonary arterial hypertension (HCC) Stable. Bronchodilators ordered as needed.   Normocytic anemia/fecal occult blood test positive Thought to be from severely macerated perirectum, and multiple hematomas including a hip hematoma seen on CT scanning. Hemoglobin stable.   Opioid and benzodiazepine dependence (HCC) Currently on Valium 2 mg every 8 hours. Also has opiates ordered as needed.   C. difficile colitis/abdominal  pain Developed significant diarrhea and abdominal pain in the setting of recent broad-spectrum antibiotics. C. difficile testing sent and returned positive 09/11/15. Placed on oral vancomycin with plans to complete a ten-day course on 09/21/15.   Acute on chronic diastolic CHF (congestive heart failure), NYHA class 1 (HCC) Chest x-ray on 09/03/15 showed worsening changes of CHF with interstitial and alveolar edema. Diuresed. 2-D echo repeated 09/04/15 and showed EF 60-65 percent with moderate to severe pulmonary hypertension. Lasix 1 given 09/16/15 secondary to hypoxia and labored breathing.   Chronic atrial fibrillation (HCC) Xarelto held on admission and the patient was treated with IV heparin. Heparin was subsequently discontinued in the setting of significant anemia and fecal occult positive blood in the stools. Given history of falls and coagulopathy, will need to have a discussion with the patient and his family regarding risk versus benefit of ongoing anticoagulation. Remains off Xarelto.   Essential hypertension Blood pressure stable, slowly resume antihypertensives.  Benazepril and diuretics resumed.  Beta blocker still on hold.   Apparent cirrhosis by CT GI ordered serologies. Hepatitis serologies negative. ANA was positive, anti-smooth muscle and antimitochondrial antibodies were negative. Consider outpatient GI follow up.   Hypomagnesemia Repleted.    Medical Consultants:    Gastroenterology  Neurology  Pulmonology   Discharge Exam:   Filed Vitals:   09/16/15 1505 09/16/15 2130  BP: 122/49 118/53  Pulse: 92 90  Temp: 98.2 F (36.8 C) 98.3 F (36.8 C)  Resp: 19 20   Filed Vitals:   09/16/15 1927 09/16/15 2130 09/17/15 0246 09/17/15 0500  BP:  118/53    Pulse:  90  Temp:  98.3 F (36.8 C)    TempSrc:  Oral    Resp:  20    Height:      Weight:    89.3 kg (196 lb 13.9 oz)  SpO2: 98% 99% 98%     General exam: No distress.  Oropharynx: Thrush on tongue  resolving. Respiratory system: Lungs with bilateral mild upper airway wheeze. Cardiovascular system: S1 & S2 heard, slightly irregular. No JVD, murmurs, rubs, gallops or clicks. + pedal edema. Gastrointestinal system: Abdomen is mildly distended and nontender. No organomegaly or masses felt. Normal bowel sounds heard. Central nervous system: Alert and oriented to self and place. No focal neurological deficits. Extremities: Symmetric 4+ x 5 power. No clubbing or cyanosis. Skin: Venous stasis dermatitis with ulcerations/wounds to lower extremity. Psychiatry: Judgement and insight appear normal. Mood & affect irritable.    The results of significant diagnostics from this hospitalization (including imaging, microbiology, ancillary and laboratory) are listed below for reference.     Procedures and Diagnostic Studies:   Dg Swallowing Func-speech Pathology  09/14/2015  Objective Swallowing Evaluation: Type of Study: MBS-Modified Barium Swallow Study Patient Details Name: LENNART GLADISH MRN: [id number] Date of Birth: 09-Dec-1935 Today's Date: 09/14/2015 Time: SLP Start Time (ACUTE ONLY): 1237-SLP Stop Time (ACUTE ONLY): 1255 SLP Time Calculation (min) (ACUTE ONLY): 18 min Past Medical History: Past Medical History Diagnosis Date . Hypertension  . Hyperlipidemia  . COPD (chronic obstructive pulmonary disease) ([place])  . Kidney disease, chronic, stage III (moderate, EGFR 30-59 ml/min)    ARF 2013 . Atrial fibrillation and flutter [facility]) 2013   Dr Nehemiah Massed at [facility]  on [place] [address].  . OSA (obstructive sleep apnea)    non-compliant with CPAP.  Past Surgical History: Past Surgical History Procedure Laterality Date . Hyperplastic colon polyp  02/2002   Sigmoid polyps . Coronary artery bypass graft  10/1997 . History of cervical discectomy  2009   C16-C66 HPI: 80 year old male found unresponsive at home with acute respiratory failure, acute renal failure and acute anemia. History of possible seizure activity, chronic opioid/bzd  dependence. Intubated from 4/11 to 4/13.  Subjective: The patient was seen in radiology for MBS to determine safest PO diet.  Assessment / Plan / Recommendation CHL IP CLINICAL IMPRESSIONS 09/14/2015 Therapy Diagnosis Mild oral phase dysphagia;Moderate pharyngeal phase dysphagia Clinical Impression MBSS was completed.  The patient presented with mild oral and moderate pharyngeal dysphagia characterized by decreased bolus cohesion, delayed oral transit, premature spillage, delayed swallow trigger, decreased pharyngeal squeeze leading to mild vallecular residue.  Silent penetration during the swallow was seen given self fed cup sips of thin.  Chin tuck prevented this from occurring.  The patient refused any solids due to lingual pain.  Esophageal sweep revealed it slow to clear.  Recommend a dysphagia 1 diet with nectar thick liquids.  Can consider thin liquids given full supervision to ensure use of a chin tuck.  ST will follow up for therapeutic diet tolerance, education and possible diet advancement.  The patient will need follow up at the next level of care.    Impact on safety and function Mild aspiration risk   CHL IP TREATMENT RECOMMENDATION 09/14/2015 Treatment Recommendations Therapy as outlined in treatment plan below   Prognosis 09/14/2015 Prognosis for Safe Diet Advancement Fair Barriers to Reach Goals -- Barriers/Prognosis Comment -- CHL IP DIET RECOMMENDATION 09/14/2015 SLP Diet Recommendations Dysphagia 1 (Puree) solids;Nectar thick liquid Liquid Administration via Cup;No straw Medication Administration Crushed with puree Compensations Slow rate;Small sips/bites  Postural Changes Remain semi-upright after after feeds/meals (Comment);Seated upright at 90 degrees   CHL IP OTHER RECOMMENDATIONS 09/14/2015 Recommended Consults -- Oral Care Recommendations Oral care BID Other Recommendations Order thickener from pharmacy;Have oral suction available   CHL IP FOLLOW UP RECOMMENDATIONS 09/14/2015 Follow up  Recommendations Other (comment)   CHL IP FREQUENCY AND DURATION 09/14/2015 Speech Therapy Frequency (ACUTE ONLY) min 2x/week Treatment Duration 2 weeks      CHL IP ORAL PHASE 09/14/2015 Oral Phase Impaired Oral - Pudding Teaspoon -- Oral - Pudding Cup -- Oral - Honey Teaspoon -- Oral - Honey Cup -- Oral - Nectar Teaspoon -- Oral - Nectar Cup Decreased bolus cohesion;Premature spillage Oral - Nectar Straw -- Oral - Thin Teaspoon Decreased bolus cohesion;Premature spillage Oral - Thin Cup Decreased bolus cohesion;Premature spillage Oral - Thin Straw -- Oral - Puree Delayed oral transit;Decreased bolus cohesion;Premature spillage Oral - Mech Soft -- Oral - Regular -- Oral - Multi-Consistency -- Oral - Pill -- Oral Phase - Comment --  CHL IP PHARYNGEAL PHASE 09/14/2015 Pharyngeal Phase Impaired Pharyngeal- Pudding Teaspoon -- Pharyngeal -- Pharyngeal- Pudding Cup -- Pharyngeal -- Pharyngeal- Honey Teaspoon -- Pharyngeal -- Pharyngeal- Honey Cup -- Pharyngeal -- Pharyngeal- Nectar Teaspoon -- Pharyngeal -- Pharyngeal- Nectar Cup Delayed swallow initiation-vallecula;Pharyngeal residue - valleculae Pharyngeal -- Pharyngeal- Nectar Straw -- Pharyngeal -- Pharyngeal- Thin Teaspoon Delayed swallow initiation-pyriform sinuses;Pharyngeal residue - valleculae;Reduced pharyngeal peristalsis Pharyngeal -- Pharyngeal- Thin Cup Delayed swallow initiation-pyriform sinuses;Pharyngeal residue - valleculae;Penetration/Aspiration during swallow;Reduced pharyngeal peristalsis Pharyngeal Material enters airway, remains ABOVE vocal cords and not ejected out Pharyngeal- Thin Straw -- Pharyngeal -- Pharyngeal- Puree Delayed swallow initiation-vallecula;Pharyngeal residue - valleculae;Reduced pharyngeal peristalsis Pharyngeal -- Pharyngeal- Mechanical Soft -- Pharyngeal -- Pharyngeal- Regular -- Pharyngeal -- Pharyngeal- Multi-consistency -- Pharyngeal -- Pharyngeal- Pill -- Pharyngeal -- Pharyngeal Comment --  CHL IP CERVICAL ESOPHAGEAL PHASE  09/14/2015 Cervical Esophageal Phase WFL Pudding Teaspoon -- Pudding Cup -- Honey Teaspoon -- Honey Cup -- Nectar Teaspoon -- Nectar Cup -- Nectar Straw -- Thin Teaspoon -- Thin Cup -- Thin Straw -- Puree -- Mechanical Soft -- Regular -- Multi-consistency -- Pill -- Cervical Esophageal Comment -- CHL IP GO 09/14/2015 Functional Assessment Tool Used ASHA NOMS and clinical judgment.   Functional Limitations Swallowing Swallow Current Status [phone]) CK Swallow Goal Status (M3536) CK Swallow Discharge Status [phone]) (None) Motor Speech Current Status [phone]) (None) Motor Speech Goal Status [phone]) (None) Motor Speech Goal Status [phone]) (None) Spoken Language Comprehension Current Status [phone]) (None) Spoken Language Comprehension Goal Status (P8099) (None) Spoken Language Comprehension Discharge Status [phone]) (None) Spoken Language Expression Current Status [phone]) (None) Spoken Language Expression Goal Status [phone]) (None) Spoken Language Expression Discharge Status [phone]) (None) Attention Current Status (K2409) (None) Attention Goal Status (B3532) (None) Attention Discharge Status [phone]) (None) Memory Current Status (A8341) (None) Memory Goal Status (D6222) (None) Memory Discharge Status (L7989) (None) Voice Current Status (Q1194) (None) Voice Goal Status (R7408) (None) Voice Discharge Status [phone]) (None) Other Speech-Language Pathology Functional Limitation [phone]) (None) Other Speech-Language Pathology Functional Limitation Goal Status (S9702) (None) Other Speech-Language Pathology Functional Limitation Discharge Status [phone]) (None) Shelly Flatten, MA, CCC-SLP Acute Rehab SLP [phone] Lamar Sprinkles 09/14/2015, 1:12 PM                Labs:   Basic Metabolic Panel:  Recent Labs Lab 09/11/15 0205 09/12/15 0359 09/13/15 0355 09/13/15 1553 09/15/15 0420  NA 139 139 137 137 138  K 3.5 3.3* 3.6 3.9 4.5  CL 107 107 107 106  111  CO2 22 20* 20* 20* 20*  GLUCOSE 110* 133* 98 96 100*  BUN 59*  41* 36* 35* 25*  CREATININE 2.02* 2.03* 1.92* 1.95* 1.84*  CALCIUM 7.8* 8.1* 7.8* 8.0* 7.9*  MG  --   --   --  1.5* 1.7   GFR Estimated Creatinine Clearance: 35.9 mL/min (by C-G formula based on Cr of 1.84). Liver Function Tests:  Recent Labs Lab 09/11/15 0205 09/12/15 0359  AST 42* 31  ALT 32 30  ALKPHOS 52 57  BILITOT 1.3* 1.1  PROT 5.4* 5.8*  ALBUMIN 2.6* 2.7*   Coagulation profile  Recent Labs Lab 09/11/15 1910  INR 1.46    CBC:  Recent Labs Lab 09/11/15 1910 09/12/15 0359 09/13/15 0355 09/14/15 0444 09/15/15 0420  WBC 23.0* 25.8* 20.2* 15.5* 14.7*  NEUTROABS  --  24.0* 18.3* 13.3*  --   HGB 7.5* 8.2* 7.5* 7.4* 7.5*  HCT 24.6* 27.3* 24.6* 24.6* 24.5*  MCV 90.8 91.6 90.4 91.4 91.1  PLT 175 212 201 222 252   Microbiology Recent Results (from the past 240 hour(s))  C difficile quick scan w PCR reflex     Status: Abnormal   Collection Time: 09/11/15  3:36 PM  Result Value Ref Range Status   C Diff antigen POSITIVE (A) NEGATIVE Final   C Diff toxin POSITIVE (A) NEGATIVE Final   C Diff interpretation Positive for toxigenic C. difficile  Final    Comment: CRITICAL RESULT CALLED TO, READ BACK BY AND VERIFIED WITH: GLOSSON RN 16:30 09/11/15 ([email or web address])      Discharge Instructions:      Medication List    STOP taking these medications        meclizine 25 MG tablet  Commonly known as:  ANTIVERT     metoprolol succinate 100 MG 24 hr tablet  Commonly known as:  TOPROL-XL     sildenafil 20 MG tablet  Commonly known as:  REVATIO     XARELTO 15 MG Tabs tablet  Generic drug:  Rivaroxaban      TAKE these medications        benazepril 20 MG tablet  Commonly known as:  LOTENSIN  Take 1 tablet by mouth daily. Reported on 07/08/2015     colchicine 0.6 MG tablet  Take 1 tablet (0.6 mg total) by mouth daily as needed (for gout).     CoQ10 200 MG Caps  Take 1 capsule by mouth daily.     diazepam 2 MG tablet  Commonly known as:  VALIUM  Take 1 tablet  (2 mg total) by mouth every 8 (eight) hours as needed for anxiety. Reported on 07/08/2015     Digoxin 62.5 MCG Tabs  Commonly known as:  DIGOX  Take 0.0625 mg by mouth daily.     famotidine 20 MG tablet  Commonly known as:  PEPCID  Take 1 tablet (20 mg total) by mouth daily.     Febuxostat 80 MG Tabs  Commonly known as:  ULORIC  Take 1 tablet (80 mg total) by mouth daily.     fluconazole 40 MG/ML suspension  Commonly known as:  DIFLUCAN  Take 2.5 mLs (100 mg total) by mouth daily.     gabapentin 100 MG capsule  Commonly known as:  NEURONTIN  Take 1 capsule (100 mg total) by mouth 3 (three) times daily.     hydrocortisone-pramoxine rectal foam  Commonly known as:  PROCTOFOAM-HC  Place 1 applicator rectally 2 (two) times daily.  ipratropium-albuterol 0.5-2.5 (3) MG/3ML Soln  Commonly known as:  DUONEB  Take 3 mLs by nebulization every 6 (six) hours as needed.     lidocaine 2 % solution  Commonly known as:  XYLOCAINE  Use as directed 15 mLs in the mouth or throat every 3 (three) hours as needed for mouth pain.     metolazone 5 MG tablet  Commonly known as:  ZAROXOLYN  Take 2 tablets by mouth daily.     OXcarbazepine 150 MG tablet  Commonly known as:  TRILEPTAL  Take 1 tablet (150 mg total) by mouth 2 (two) times daily. Prescribed by Dr. Melrose Nakayama     oxyCODONE 15 MG immediate release tablet  Commonly known as:  ROXICODONE  Take 1 tablet (15 mg total) by mouth every 4 (four) hours as needed for pain.     potassium chloride SA 20 MEQ tablet  Commonly known as:  K-DUR,KLOR-CON  Take 2 tablets (40 mEq total) by mouth 2 (two) times daily.     ranitidine 150 MG tablet  Commonly known as:  ZANTAC  Take 1 tablet by mouth 2 (two) times daily as needed. As needed for stomach burning     RESOURCE THICKENUP CLEAR Powd  Nectar thicken consistency     rOPINIRole 4 MG tablet  Commonly known as:  REQUIP  TAKE ONE TABLET BY MOUTH EVERY NIGHT AT BEDTIME FOR RESTLESS LEG SYNDROME       rosuvastatin 40 MG tablet  Commonly known as:  CRESTOR  TAKE ONE (1) TABLET EACH DAY *GENERIC CRESTOR     torsemide 20 MG tablet  Commonly known as:  DEMADEX  2 tablets once or twice a day for swelling     vancomycin 50 mg/mL oral solution  Commonly known as:  VANCOCIN  Take 2.5 mLs (125 mg total) by mouth every 6 (six) hours.     zolpidem 5 MG tablet  Commonly known as:  AMBIEN  Take 1 tablet (5 mg total) by mouth at bedtime. Reported on 07/08/2015          Time coordinating discharge: 40 minutes.  Signed:  Drayke Grabel  Pager [phone] [facility] 09/17/2015, 11:15 AM

## 2015-09-17 NOTE — Progress Notes (Signed)
Progress Note    Vincent Mason  [id number] DOB: 05-20-1936  DOA: 09/03/2015 PCP: Lelon Huh, MD   Outpatient Specialists:   Dr. Vira Agar in [place], Gastroenterology   LATE ENTRY  Brief Narrative:   Vincent Mason is an 80 y.o. male with a PMH of atrial fibrillation on Xarelto, chronic diastolic heart failure, pulmonary HTN, COPD unknown Stage, CKD Stage III, possible history of seizures who was admitted 09/03/15 after being found unresponsive at home. EMS reported he had unequal pupils with left larger than right. He was intubated on arrival to the ED due to acute respiratory failure. Wife reports several episodes of staring spells and several episodes of unresponsiveness that have lasted 5-10 minutes the past several months. He was admitted 12/2014 with similar presentation and thought to be having seizures at that time and started on Trileptal with neurology follow up. Patient transferred from [facility] on 09/09/15, continued to have persistent leukocytosis, abdominal pain. CT scan of the chest of the abdomen revealed patchy bilateral airspace disease and possible right and left ischemic colitis. Also developed C. difficile colitis for which he is being treated with oral vancomycin.  Assessment/Plan:   Principal Problem:   Acute respiratory failure with hypoxia (HCC) secondary to acute septic encephalopathy / HCAP Presented after being found unresponsive at home in acute respiratory failure, initially suspicious for possible seizure event in the setting of chronic opiate/benzodiazepine dependency. Intubated on admission. CT of the head negative for acute bleed. Neurology saw the patient consultation and felt presentation most consistent with septic encephalopathy. Extubated 09/05/15. Although initial chest x-ray showed significant infiltrates, these appeared to improve with diuresis and it was unclear if his presenting shock was from sepsis secondary to pneumonia. He did complete treatment  with Levaquin and vancomycin for presumed aspiration pneumonia. He underwent a CT scan of the chest on 09/10/15 which showed patchy bilateral airspace disease suggestive of bronchopneumonia and antibiotics were reinitiated with vancomycin and Zosyn to cover healthcare associated pathogens. Antibiotics were subsequently discontinued 09/11/15 and setting of C. difficile infection. Currently denies dyspnea, but continues to cough. Will hold antibiotics for now. Add Mucinex. Chest x-ray repeated 09/15/15 and showed resolving patchy bibasilar airspace disease. Improved with Lasix/Duonebs.  Severe sepsis with Septic shock Required pressor support with stabilization of blood pressure on broad-spectrum antibiotics. Pressors were weaned by 09/07/15. No evidence of adrenal insufficiency. Source suspected to be from aspiration pneumonia versus UTI. Blood cultures and sputum cultures negative. Speech therapy evaluation performed, diet change to dysphagia 1 with nectar thickened liquids.  Active Problems:   Oral thrush Continue Diflucan 100 mg x 7 days, Magic mouthwash QID and viscous lidocaine when necessary.    Seizures ([place]) EEG done 09/03/15, no seizure focus identified.    Hyperlipidemia Crestor initially held secondary to transaminitis while in the hospital. LFTs now normalized. Crestor resumed.    Coronary artery disease Continue statin.    Acute on chronic renal failure/Chronic kidney disease (CKD), stage III (moderate)/acute tubular necrosis Had significant deterioration of renal function in the setting of hypotension and shock. Creatinine has improved from 5.25---> 1.84.    COPD (chronic obstructive pulmonary disease) (HCC) Stable.    Normocytic anemia/fecal occult blood test positive Thought to be from severely macerated perirectum, and multiple hematomas including a hip hematoma seen on CT scanning. Hemoglobin stable.    Opioid and benzodiazepine dependence (HCC) Currently on Valium 2 mg every  8 hours. Also has opiates ordered as needed.    Pulmonary arterial  hypertension (HCC)    Status post peripherally inserted central catheter (PICC) central line placement    C. difficile colitis/abdominal pain Developed significant diarrhea and abdominal pain in the setting of recent broad-spectrum antibiotics. C. difficile testing sent and returned positive for/19/17. Placed on oral vancomycin with plans to complete a ten-day course.    Acute on chronic diastolic CHF (congestive heart failure), NYHA class 1 (HCC) Chest x-ray on 09/03/15 showed worsening changes of CHF with interstitial and alveolar edema. Diuresed. 2-D echo repeated 09/04/15 and showed EF 60-65 percent with moderate to severe pulmonary hypertension. Given a dose of Lasix with improvement.    Chronic atrial fibrillation (HCC) Xarelto held on admission and the patient was treated with IV heparin. Heparin was subsequently discontinued in the setting of significant anemia and fecal occult positive blood in the stools. Given history of falls and coagulopathy, will need to have a discussion with the patient and his family regarding risk versus benefit of ongoing anticoagulation. Remains off Xarelto.    Essential hypertension Blood pressure stable off antihypertensives.    Apparent cirrhosis by CT GI in the process of evaluating with serologies. Hepatitis serologies negative. ANA was positive, anti-smooth muscle and antimitochondrial antibodies were negative.    Hypomagnesemia Repleted.   Family Communication/Anticipated D/C date and plan/Code Status   DVT prophylaxis: Lovenox ordered. Code Status: Full Code.  Family Communication: Wife, [name] called at [phone], updated by telephone. Disposition Plan: SNF when medically stable, and able to maintain hydration status given ongoing diarrhea.   Medical Consultants:    Gastroenterology  Neurology  Pulmonology   Procedures:    Intubation  EEG  2-D  echo  Anti-Infectives:    Levaquin 4/11-4/16  Vancomycin 4/11-4/14  Restarted vanc and zosyn 4/19 but stopped on the same day  Oral vancomycin 4/19  Subjective:   Vincent Mason reports ongoing soreness in his mouth. Irritable.  Multiple complaints of nursing staff.  Didn't want his breakfast because it didn't have any taste.  Complains of leg pain.  Objective:    Filed Vitals:   09/16/15 1927 09/16/15 2130 09/17/15 0246 09/17/15 0500  BP:  118/53    Pulse:  90    Temp:  98.3 F (36.8 C)    TempSrc:  Oral    Resp:  20    Height:      Weight:    89.3 kg (196 lb 13.9 oz)  SpO2: 98% 99% 98%     Intake/Output Summary (Last 24 hours) at 09/17/15 1126 Last data filed at 09/17/15 0639  Gross per 24 hour  Intake     20 ml  Output   2431 ml  Net  -2411 ml   Filed Weights   09/14/15 0642 09/15/15 0559 09/17/15 0500  Weight: 88.769 kg (195 lb 11.2 oz) 88.5 kg (195 lb 1.7 oz) 89.3 kg (196 lb 13.9 oz)    Exam: General exam: Irritable.  Oropharynx: Thrush on tongue. Respiratory system: Lungs with bilateral upper airway rales, wheeze.  Mildly labored. Cardiovascular system: S1 & S2 heard, slightly irregular. No JVD, murmurs, rubs, gallops or clicks. + pedal edema. Gastrointestinal system: Abdomen is mildly distended and nontender. No organomegaly or masses felt. Normal bowel sounds heard. Central nervous system: Alert and oriented to self and place. No focal neurological deficits. Extremities: Symmetric 4+ x 5 power. No clubbing or cyanosis. Skin: Venous stasis dermatitis with ulcerations/wounds to lower extremity. Psychiatry: Judgement and insight appear normal. Mood & affect appropriate.   Data Reviewed:  I have personally reviewed following labs and imaging studies:  Labs: Basic Metabolic Panel:  Recent Labs Lab 09/11/15 0205 09/12/15 0359 09/13/15 0355 09/13/15 1553 09/15/15 0420  NA 139 139 137 137 138  K 3.5 3.3* 3.6 3.9 4.5  CL 107 107 107 106 111  CO2  22 20* 20* 20* 20*  GLUCOSE 110* 133* 98 96 100*  BUN 59* 41* 36* 35* 25*  CREATININE 2.02* 2.03* 1.92* 1.95* 1.84*  CALCIUM 7.8* 8.1* 7.8* 8.0* 7.9*  MG  --   --   --  1.5* 1.7   GFR Estimated Creatinine Clearance: 35.9 mL/min (by C-G formula based on Cr of 1.84). Liver Function Tests:  Recent Labs Lab 09/11/15 0205 09/12/15 0359  AST 42* 31  ALT 32 30  ALKPHOS 52 57  BILITOT 1.3* 1.1  PROT 5.4* 5.8*  ALBUMIN 2.6* 2.7*   Coagulation profile  Recent Labs Lab 09/11/15 1910  INR 1.46    CBC:  Recent Labs Lab 09/11/15 1910 09/12/15 0359 09/13/15 0355 09/14/15 0444 09/15/15 0420  WBC 23.0* 25.8* 20.2* 15.5* 14.7*  NEUTROABS  --  24.0* 18.3* 13.3*  --   HGB 7.5* 8.2* 7.5* 7.4* 7.5*  HCT 24.6* 27.3* 24.6* 24.6* 24.5*  MCV 90.8 91.6 90.4 91.4 91.1  PLT 175 212 201 222 252   CBG: No results for input(s): GLUCAP in the last 168 hours. Sepsis Labs:  Recent Labs Lab 09/12/15 0359 09/13/15 0355 09/14/15 0444 09/14/15 1040 09/14/15 1430 09/15/15 0420  WBC 25.8* 20.2* 15.5*  --   --  14.7*  LATICACIDVEN  --   --   --  1.9 1.4  --    Urine analysis:    Component Value Date/Time   COLORURINE YELLOW 09/03/2015 0346   COLORURINE Yellow 06/30/2014 0729   APPEARANCEUR TURBID* 09/03/2015 0346   APPEARANCEUR Clear 06/30/2014 0729   LABSPEC 1.013 09/03/2015 0346   LABSPEC 1.016 06/30/2014 0729   PHURINE 5.5 09/03/2015 0346   PHURINE 7.0 06/30/2014 0729   GLUCOSEU NEGATIVE 09/03/2015 0346   GLUCOSEU Negative 06/30/2014 0729   HGBUR MODERATE* 09/03/2015 0346   HGBUR 1+ 06/30/2014 0729   BILIRUBINUR NEGATIVE 09/03/2015 0346   BILIRUBINUR Negative 06/30/2014 0729   KETONESUR NEGATIVE 09/03/2015 0346   KETONESUR Negative 06/30/2014 0729   PROTEINUR >300* 09/03/2015 0346   PROTEINUR 30 mg/dL 06/30/2014 0729   NITRITE NEGATIVE 09/03/2015 0346   NITRITE Negative 06/30/2014 0729   LEUKOCYTESUR SMALL* 09/03/2015 0346   LEUKOCYTESUR Negative 06/30/2014 0729    Microbiology Recent Results (from the past 240 hour(s))  C difficile quick scan w PCR reflex     Status: Abnormal   Collection Time: 09/11/15  3:36 PM  Result Value Ref Range Status   C Diff antigen POSITIVE (A) NEGATIVE Final   C Diff toxin POSITIVE (A) NEGATIVE Final   C Diff interpretation Positive for toxigenic C. difficile  Final    Comment: CRITICAL RESULT CALLED TO, READ BACK BY AND VERIFIED WITH: GLOSSON RN 16:30 09/11/15 ([email or web address])     Radiology: Dg Chest Port 1 View  09/16/2015  CLINICAL DATA:  Acute onset of dyspnea.  Initial encounter. EXAM: PORTABLE CHEST 1 VIEW COMPARISON:  Chest radiograph performed 09/15/2015 FINDINGS: The lungs are well-aerated. Vascular congestion is noted. Increased interstitial markings raise concern for mild pulmonary edema. No definite pleural effusion or pneumothorax is seen The cardiomediastinal silhouette is mildly enlarged. The patient is status post median sternotomy, with evidence of prior CABG. A right IJ line is noted  ending about the distal SVC. No acute osseous abnormalities are seen. IMPRESSION: Vascular congestion and mild cardiomegaly. Increased interstitial markings raise concern for mild pulmonary edema. Electronically Signed   By: Garald Balding M.D.   On: 09/16/2015 00:42    Medications:   . diazepam  2 mg Oral Q8H  . digoxin  0.0625 mg Oral Daily  . enoxaparin (LOVENOX) injection  40 mg Subcutaneous Q24H  . famotidine  20 mg Oral Daily  . febuxostat  80 mg Oral Daily  . fluconazole  100 mg Oral Daily  . gabapentin  100 mg Oral BID  . guaiFENesin  600 mg Oral BID  . hydrocortisone-pramoxine  1 applicator Rectal BID  . ipratropium-albuterol  3 mL Nebulization Q6H  . magic mouthwash w/lidocaine  10 mL Oral QID  . metolazone  10 mg Oral Daily  . OXcarbazepine  150 mg Oral BID  . potassium chloride  60 mEq Oral BID  . rOPINIRole  2 mg Oral QHS  . rosuvastatin  40 mg Oral q1800  . saccharomyces boulardii  250 mg Oral BID  .  sodium chloride flush  10-40 mL Intracatheter Q12H  . torsemide  40 mg Oral BID  . vancomycin  125 mg Oral Q6H   Continuous Infusions:   Time spent: 25 minutes.   LOS: 14 days   [facility] Pager [phone]. If unable to reach me by pager, please call my cell phone at [phone].  *Please refer to [email or web address], password [email or web address] to get updated schedule on who will round on this patient, as hospitalists switch teams weekly. If 7PM-7AM, please contact night-coverage at [email or web address], password [email or web address] for any overnight needs.  09/17/2015, 11:26 AM

## 2015-09-19 LAB — CBC WITH DIFFERENTIAL/PLATELET
Basophils Absolute: 0.1 10*3/uL (ref 0–0.1)
Basophils Relative: 1 %
Eosinophils Absolute: 0.3 10*3/uL (ref 0–0.7)
Eosinophils Relative: 3 %
HCT: 28.6 % — ABNORMAL LOW (ref 40.0–52.0)
Hemoglobin: 9.4 g/dL — ABNORMAL LOW (ref 13.0–18.0)
Lymphocytes Relative: 9 %
Lymphs Abs: 0.8 10*3/uL — ABNORMAL LOW (ref 1.0–3.6)
MCH: 28.9 pg (ref 26.0–34.0)
MCHC: 32.8 g/dL (ref 32.0–36.0)
MCV: 88.1 fL (ref 80.0–100.0)
Monocytes Absolute: 0.8 10*3/uL (ref 0.2–1.0)
Monocytes Relative: 9 %
Neutro Abs: 7.4 10*3/uL — ABNORMAL HIGH (ref 1.4–6.5)
Neutrophils Relative %: 78 %
Platelets: 430 10*3/uL (ref 150–440)
RBC: 3.25 MIL/uL — ABNORMAL LOW (ref 4.40–5.90)
RDW: 20.4 % — ABNORMAL HIGH (ref 11.5–14.5)
WBC: 9.4 10*3/uL (ref 3.8–10.6)

## 2015-09-19 LAB — COMPREHENSIVE METABOLIC PANEL
ALT: 43 U/L (ref 17–63)
AST: 79 U/L — ABNORMAL HIGH (ref 15–41)
Albumin: 3 g/dL — ABNORMAL LOW (ref 3.5–5.0)
Alkaline Phosphatase: 64 U/L (ref 38–126)
Anion gap: 12 (ref 5–15)
BUN: 27 mg/dL — ABNORMAL HIGH (ref 6–20)
CO2: 23 mmol/L (ref 22–32)
Calcium: 8.5 mg/dL — ABNORMAL LOW (ref 8.9–10.3)
Chloride: 102 mmol/L (ref 101–111)
Creatinine, Ser: 2.04 mg/dL — ABNORMAL HIGH (ref 0.61–1.24)
GFR calc Af Amer: 34 mL/min — ABNORMAL LOW (ref >60)
GFR calc non Af Amer: 30 mL/min — ABNORMAL LOW (ref >60)
Glucose, Bld: 84 mg/dL (ref 65–99)
Potassium: 3.9 mmol/L (ref 3.5–5.1)
Sodium: 137 mmol/L (ref 135–145)
Total Bilirubin: 0.6 mg/dL (ref 0.3–1.2)
Total Protein: 6.6 g/dL (ref 6.5–8.1)

## 2015-09-19 LAB — TSH: TSH: 46.6 u[IU]/mL — ABNORMAL HIGH (ref 0.350–4.500)

## 2015-09-19 LAB — VITAMIN B12: Vitamin B-12: 541 pg/mL (ref 180–914)

## 2015-09-19 LAB — MAGNESIUM: Magnesium: 1.3 mg/dL — ABNORMAL LOW (ref 1.7–2.4)

## 2015-09-19 LAB — FOLATE: Folate: 6.5 ng/mL (ref >5.9)

## 2015-09-23 ENCOUNTER — Encounter
Admission: RE | Admit: 2015-09-23 | Discharge: 2015-09-23 | Disposition: A | Discharge status: Home / Self Care | Payer: Medicare Other | Source: Ambulatory Visit | Attending: Internal Medicine | Admitting: Internal Medicine

## 2015-09-23 LAB — C DIFFICILE QUICK SCREEN W PCR REFLEX
C Diff antigen: NEGATIVE
C Diff interpretation: NEGATIVE
C Diff toxin: NEGATIVE

## 2015-09-26 DIAGNOSIS — R001 Bradycardia, unspecified: Secondary | ICD-10-CM | POA: Diagnosis not present

## 2015-09-26 DIAGNOSIS — I959 Hypotension, unspecified: Secondary | ICD-10-CM | POA: Diagnosis not present

## 2015-09-26 LAB — CBC WITH DIFFERENTIAL/PLATELET
Basophils Absolute: 0.1 10*3/uL (ref 0–0.1)
Basophils Relative: 1 %
Eosinophils Absolute: 0.3 10*3/uL (ref 0–0.7)
Eosinophils Relative: 3 %
HCT: 28.6 % — ABNORMAL LOW (ref 40.0–52.0)
Hemoglobin: 9.5 g/dL — ABNORMAL LOW (ref 13.0–18.0)
Lymphocytes Relative: 8 %
Lymphs Abs: 0.7 10*3/uL — ABNORMAL LOW (ref 1.0–3.6)
MCH: 29 pg (ref 26.0–34.0)
MCHC: 33.2 g/dL (ref 32.0–36.0)
MCV: 87.4 fL (ref 80.0–100.0)
Monocytes Absolute: 0.7 10*3/uL (ref 0.2–1.0)
Monocytes Relative: 8 %
Neutro Abs: 7.4 10*3/uL — ABNORMAL HIGH (ref 1.4–6.5)
Neutrophils Relative %: 80 %
Platelets: 494 10*3/uL — ABNORMAL HIGH (ref 150–440)
RBC: 3.27 MIL/uL — ABNORMAL LOW (ref 4.40–5.90)
RDW: 19.7 % — ABNORMAL HIGH (ref 11.5–14.5)
WBC: 9.2 10*3/uL (ref 3.8–10.6)

## 2015-09-26 LAB — COMPREHENSIVE METABOLIC PANEL
ALT: 71 U/L — ABNORMAL HIGH (ref 17–63)
AST: 64 U/L — ABNORMAL HIGH (ref 15–41)
Albumin: 3.2 g/dL — ABNORMAL LOW (ref 3.5–5.0)
Alkaline Phosphatase: 125 U/L (ref 38–126)
Anion gap: 16 — ABNORMAL HIGH (ref 5–15)
BUN: 69 mg/dL — ABNORMAL HIGH (ref 6–20)
CO2: 20 mmol/L — ABNORMAL LOW (ref 22–32)
Calcium: 8.1 mg/dL — ABNORMAL LOW (ref 8.9–10.3)
Chloride: 89 mmol/L — ABNORMAL LOW (ref 101–111)
Creatinine, Ser: 4.81 mg/dL — ABNORMAL HIGH (ref 0.61–1.24)
GFR calc Af Amer: 12 mL/min — ABNORMAL LOW (ref >60)
GFR calc non Af Amer: 10 mL/min — ABNORMAL LOW (ref >60)
Glucose, Bld: 99 mg/dL (ref 65–99)
Potassium: 4.6 mmol/L (ref 3.5–5.1)
Sodium: 125 mmol/L — ABNORMAL LOW (ref 135–145)
Total Bilirubin: 0.5 mg/dL (ref 0.3–1.2)
Total Protein: 7.2 g/dL (ref 6.5–8.1)

## 2015-09-26 LAB — DIGOXIN LEVEL: Digoxin Level: 0.9 ng/mL (ref 0.8–2.0)

## 2015-09-26 LAB — TSH: TSH: 50.668 u[IU]/mL — ABNORMAL HIGH (ref 0.350–4.500)

## 2015-09-27 LAB — CBC WITH DIFFERENTIAL/PLATELET
Band Neutrophils: 0 %
Basophils Absolute: 0 10*3/uL (ref 0–0.1)
Basophils Relative: 0 %
Blasts: 0 %
Eosinophils Absolute: 0 10*3/uL (ref 0–0.7)
Eosinophils Relative: 0 %
HCT: 29.2 % — ABNORMAL LOW (ref 40.0–52.0)
Hemoglobin: 9.7 g/dL — ABNORMAL LOW (ref 13.0–18.0)
Lymphocytes Relative: 25 %
Lymphs Abs: 1.9 10*3/uL (ref 1.0–3.6)
MCH: 28.8 pg (ref 26.0–34.0)
MCHC: 33.1 g/dL (ref 32.0–36.0)
MCV: 86.9 fL (ref 80.0–100.0)
Metamyelocytes Relative: 0 %
Monocytes Absolute: 0.3 10*3/uL (ref 0.2–1.0)
Monocytes Relative: 4 %
Myelocytes: 2 %
Neutro Abs: 5.5 10*3/uL (ref 1.4–6.5)
Neutrophils Relative %: 69 %
Other: 0 %
Platelets: 475 10*3/uL — ABNORMAL HIGH (ref 150–440)
Promyelocytes Absolute: 0 %
RBC: 3.36 MIL/uL — ABNORMAL LOW (ref 4.40–5.90)
RDW: 19.6 % — ABNORMAL HIGH (ref 11.5–14.5)
WBC: 7.7 10*3/uL (ref 3.8–10.6)
nRBC: 0 /100 WBC

## 2015-09-27 LAB — BASIC METABOLIC PANEL
Anion gap: 15 (ref 5–15)
BUN: 60 mg/dL — ABNORMAL HIGH (ref 6–20)
CO2: 20 mmol/L — ABNORMAL LOW (ref 22–32)
Calcium: 8.3 mg/dL — ABNORMAL LOW (ref 8.9–10.3)
Chloride: 93 mmol/L — ABNORMAL LOW (ref 101–111)
Creatinine, Ser: 3.49 mg/dL — ABNORMAL HIGH (ref 0.61–1.24)
GFR calc Af Amer: 18 mL/min — ABNORMAL LOW (ref >60)
GFR calc non Af Amer: 15 mL/min — ABNORMAL LOW (ref >60)
Glucose, Bld: 84 mg/dL (ref 65–99)
Potassium: 4.7 mmol/L (ref 3.5–5.1)
Sodium: 128 mmol/L — ABNORMAL LOW (ref 135–145)

## 2015-09-28 LAB — BASIC METABOLIC PANEL
Anion gap: 14 (ref 5–15)
BUN: 52 mg/dL — ABNORMAL HIGH (ref 6–20)
CO2: 21 mmol/L — ABNORMAL LOW (ref 22–32)
Calcium: 8.4 mg/dL — ABNORMAL LOW (ref 8.9–10.3)
Chloride: 94 mmol/L — ABNORMAL LOW (ref 101–111)
Creatinine, Ser: 2.32 mg/dL — ABNORMAL HIGH (ref 0.61–1.24)
GFR calc Af Amer: 29 mL/min — ABNORMAL LOW (ref >60)
GFR calc non Af Amer: 25 mL/min — ABNORMAL LOW (ref >60)
Glucose, Bld: 134 mg/dL — ABNORMAL HIGH (ref 65–99)
Potassium: 3.4 mmol/L — ABNORMAL LOW (ref 3.5–5.1)
Sodium: 129 mmol/L — ABNORMAL LOW (ref 135–145)

## 2015-09-28 LAB — CBC WITH DIFFERENTIAL/PLATELET
Band Neutrophils: 0 %
Basophils Absolute: 0 10*3/uL (ref 0–0.1)
Basophils Relative: 0 %
Blasts: 0 %
Eosinophils Absolute: 0.1 10*3/uL (ref 0–0.7)
Eosinophils Relative: 2 %
HCT: 28.1 % — ABNORMAL LOW (ref 40.0–52.0)
Hemoglobin: 9.4 g/dL — ABNORMAL LOW (ref 13.0–18.0)
Lymphocytes Relative: 15 %
Lymphs Abs: 0.9 10*3/uL — ABNORMAL LOW (ref 1.0–3.6)
MCH: 28.8 pg (ref 26.0–34.0)
MCHC: 33.6 g/dL (ref 32.0–36.0)
MCV: 85.6 fL (ref 80.0–100.0)
Metamyelocytes Relative: 1 %
Monocytes Absolute: 0.3 10*3/uL (ref 0.2–1.0)
Monocytes Relative: 5 %
Myelocytes: 1 %
Neutro Abs: 4.9 10*3/uL (ref 1.4–6.5)
Neutrophils Relative %: 76 %
Other: 0 %
Platelets: 406 10*3/uL (ref 150–440)
Promyelocytes Absolute: 0 %
RBC: 3.28 MIL/uL — ABNORMAL LOW (ref 4.40–5.90)
RDW: 19.1 % — ABNORMAL HIGH (ref 11.5–14.5)
WBC: 6.2 10*3/uL (ref 3.8–10.6)
nRBC: 0 /100 WBC

## 2015-09-30 ENCOUNTER — Other Ambulatory Visit: Payer: Self-pay | Admitting: Internal Medicine

## 2015-09-30 DIAGNOSIS — R1310 Dysphagia, unspecified: Secondary | ICD-10-CM

## 2015-10-03 LAB — CBC WITH DIFFERENTIAL/PLATELET
Basophils Absolute: 0.1 10*3/uL (ref 0–0.1)
Basophils Relative: 1 %
Eosinophils Absolute: 0.3 10*3/uL (ref 0–0.7)
Eosinophils Relative: 2 %
HCT: 28.3 % — ABNORMAL LOW (ref 40.0–52.0)
Hemoglobin: 9.6 g/dL — ABNORMAL LOW (ref 13.0–18.0)
Lymphocytes Relative: 8 %
Lymphs Abs: 0.9 10*3/uL — ABNORMAL LOW (ref 1.0–3.6)
MCH: 28.8 pg (ref 26.0–34.0)
MCHC: 33.8 g/dL (ref 32.0–36.0)
MCV: 85.5 fL (ref 80.0–100.0)
Monocytes Absolute: 0.8 10*3/uL (ref 0.2–1.0)
Monocytes Relative: 6 %
Neutro Abs: 10.5 10*3/uL — ABNORMAL HIGH (ref 1.4–6.5)
Neutrophils Relative %: 83 %
Platelets: 272 10*3/uL (ref 150–440)
RBC: 3.31 MIL/uL — ABNORMAL LOW (ref 4.40–5.90)
RDW: 19.1 % — ABNORMAL HIGH (ref 11.5–14.5)
WBC: 12.6 10*3/uL — ABNORMAL HIGH (ref 3.8–10.6)

## 2015-10-03 LAB — BASIC METABOLIC PANEL
Anion gap: 12 (ref 5–15)
BUN: 28 mg/dL — ABNORMAL HIGH (ref 6–20)
CO2: 24 mmol/L (ref 22–32)
Calcium: 7.4 mg/dL — ABNORMAL LOW (ref 8.9–10.3)
Chloride: 94 mmol/L — ABNORMAL LOW (ref 101–111)
Creatinine, Ser: 1.62 mg/dL — ABNORMAL HIGH (ref 0.61–1.24)
GFR calc Af Amer: 45 mL/min — ABNORMAL LOW (ref >60)
GFR calc non Af Amer: 39 mL/min — ABNORMAL LOW (ref >60)
Glucose, Bld: 75 mg/dL (ref 65–99)
Potassium: 2.5 mmol/L — CL (ref 3.5–5.1)
Sodium: 130 mmol/L — ABNORMAL LOW (ref 135–145)

## 2015-10-04 LAB — COMPREHENSIVE METABOLIC PANEL
ALT: 39 U/L (ref 17–63)
AST: 42 U/L — ABNORMAL HIGH (ref 15–41)
Albumin: 3.7 g/dL (ref 3.5–5.0)
Alkaline Phosphatase: 111 U/L (ref 38–126)
Anion gap: 12 (ref 5–15)
BUN: 23 mg/dL — ABNORMAL HIGH (ref 6–20)
CO2: 26 mmol/L (ref 22–32)
Calcium: 8.5 mg/dL — ABNORMAL LOW (ref 8.9–10.3)
Chloride: 98 mmol/L — ABNORMAL LOW (ref 101–111)
Creatinine, Ser: 1.52 mg/dL — ABNORMAL HIGH (ref 0.61–1.24)
GFR calc Af Amer: 49 mL/min — ABNORMAL LOW (ref >60)
GFR calc non Af Amer: 42 mL/min — ABNORMAL LOW (ref >60)
Glucose, Bld: 81 mg/dL (ref 65–99)
Potassium: 3.4 mmol/L — ABNORMAL LOW (ref 3.5–5.1)
Sodium: 136 mmol/L (ref 135–145)
Total Bilirubin: 0.4 mg/dL (ref 0.3–1.2)
Total Protein: 7.9 g/dL (ref 6.5–8.1)

## 2015-10-04 LAB — CBC WITH DIFFERENTIAL/PLATELET
Basophils Absolute: 0.1 10*3/uL (ref 0–0.1)
Basophils Relative: 1 %
Eosinophils Absolute: 0.2 10*3/uL (ref 0–0.7)
Eosinophils Relative: 2 %
HCT: 31.6 % — ABNORMAL LOW (ref 40.0–52.0)
Hemoglobin: 10.4 g/dL — ABNORMAL LOW (ref 13.0–18.0)
Lymphocytes Relative: 4 %
Lymphs Abs: 0.6 10*3/uL — ABNORMAL LOW (ref 1.0–3.6)
MCH: 28.2 pg (ref 26.0–34.0)
MCHC: 32.7 g/dL (ref 32.0–36.0)
MCV: 86.2 fL (ref 80.0–100.0)
Monocytes Absolute: 0.9 10*3/uL (ref 0.2–1.0)
Monocytes Relative: 6 %
Neutro Abs: 12.4 10*3/uL — ABNORMAL HIGH (ref 1.4–6.5)
Neutrophils Relative %: 87 %
Platelets: 331 10*3/uL (ref 150–440)
RBC: 3.67 MIL/uL — ABNORMAL LOW (ref 4.40–5.90)
RDW: 19.1 % — ABNORMAL HIGH (ref 11.5–14.5)
WBC: 14.2 10*3/uL — ABNORMAL HIGH (ref 3.8–10.6)

## 2015-10-08 LAB — COMPREHENSIVE METABOLIC PANEL WITH GFR
ALT: 112 U/L — ABNORMAL HIGH (ref 17–63)
AST: 145 U/L — ABNORMAL HIGH (ref 15–41)
Albumin: 3 g/dL — ABNORMAL LOW (ref 3.5–5.0)
Alkaline Phosphatase: 206 U/L — ABNORMAL HIGH (ref 38–126)
Anion gap: 12 (ref 5–15)
BUN: 32 mg/dL — ABNORMAL HIGH (ref 6–20)
CO2: 26 mmol/L (ref 22–32)
Calcium: 7.9 mg/dL — ABNORMAL LOW (ref 8.9–10.3)
Chloride: 97 mmol/L — ABNORMAL LOW (ref 101–111)
Creatinine, Ser: 1.65 mg/dL — ABNORMAL HIGH (ref 0.61–1.24)
GFR calc Af Amer: 44 mL/min — ABNORMAL LOW
GFR calc non Af Amer: 38 mL/min — ABNORMAL LOW
Glucose, Bld: 96 mg/dL (ref 65–99)
Potassium: 2.6 mmol/L — CL (ref 3.5–5.1)
Sodium: 135 mmol/L (ref 135–145)
Total Bilirubin: 0.7 mg/dL (ref 0.3–1.2)
Total Protein: 7 g/dL (ref 6.5–8.1)

## 2015-10-08 LAB — CBC WITH DIFFERENTIAL/PLATELET
Basophils Absolute: 0.1 10*3/uL (ref 0–0.1)
Basophils Relative: 0 %
Eosinophils Absolute: 0.3 10*3/uL (ref 0–0.7)
Eosinophils Relative: 1 %
HCT: 31.3 % — ABNORMAL LOW (ref 40.0–52.0)
Hemoglobin: 10.3 g/dL — ABNORMAL LOW (ref 13.0–18.0)
Lymphocytes Relative: 3 %
Lymphs Abs: 0.6 10*3/uL — ABNORMAL LOW (ref 1.0–3.6)
MCH: 28.5 pg (ref 26.0–34.0)
MCHC: 33 g/dL (ref 32.0–36.0)
MCV: 86.3 fL (ref 80.0–100.0)
Monocytes Absolute: 1.2 10*3/uL — ABNORMAL HIGH (ref 0.2–1.0)
Monocytes Relative: 6 %
Neutro Abs: 17.6 10*3/uL — ABNORMAL HIGH (ref 1.4–6.5)
Neutrophils Relative %: 90 %
Platelets: 263 10*3/uL (ref 150–440)
RBC: 3.62 MIL/uL — ABNORMAL LOW (ref 4.40–5.90)
RDW: 18.9 % — ABNORMAL HIGH (ref 11.5–14.5)
WBC: 19.7 10*3/uL — ABNORMAL HIGH (ref 3.8–10.6)

## 2015-10-09 DIAGNOSIS — R1900 Intra-abdominal and pelvic swelling, mass and lump, unspecified site: Secondary | ICD-10-CM | POA: Diagnosis not present

## 2015-10-09 DIAGNOSIS — R109 Unspecified abdominal pain: Secondary | ICD-10-CM | POA: Diagnosis not present

## 2015-10-09 LAB — BASIC METABOLIC PANEL
Anion gap: 17 — ABNORMAL HIGH (ref 5–15)
BUN: 31 mg/dL — ABNORMAL HIGH (ref 6–20)
CO2: 24 mmol/L (ref 22–32)
Calcium: 8.5 mg/dL — ABNORMAL LOW (ref 8.9–10.3)
Chloride: 95 mmol/L — ABNORMAL LOW (ref 101–111)
Creatinine, Ser: 1.67 mg/dL — ABNORMAL HIGH (ref 0.61–1.24)
GFR calc Af Amer: 44 mL/min — ABNORMAL LOW (ref >60)
GFR calc non Af Amer: 38 mL/min — ABNORMAL LOW (ref >60)
Glucose, Bld: 42 mg/dL — CL (ref 65–99)
Potassium: 4.1 mmol/L (ref 3.5–5.1)
Sodium: 136 mmol/L (ref 135–145)

## 2015-10-09 LAB — CBC WITH DIFFERENTIAL/PLATELET
Basophils Absolute: 0.1 10*3/uL (ref 0–0.1)
Basophils Relative: 0 %
Eosinophils Absolute: 0.1 10*3/uL (ref 0–0.7)
Eosinophils Relative: 1 %
HCT: 33.6 % — ABNORMAL LOW (ref 40.0–52.0)
Hemoglobin: 10.8 g/dL — ABNORMAL LOW (ref 13.0–18.0)
Lymphocytes Relative: 4 %
Lymphs Abs: 0.8 10*3/uL — ABNORMAL LOW (ref 1.0–3.6)
MCH: 28.3 pg (ref 26.0–34.0)
MCHC: 32.2 g/dL (ref 32.0–36.0)
MCV: 88.1 fL (ref 80.0–100.0)
Monocytes Absolute: 1.1 10*3/uL — ABNORMAL HIGH (ref 0.2–1.0)
Monocytes Relative: 5 %
Neutro Abs: 19.4 10*3/uL — ABNORMAL HIGH (ref 1.4–6.5)
Neutrophils Relative %: 90 %
Platelets: 270 10*3/uL (ref 150–440)
RBC: 3.82 MIL/uL — ABNORMAL LOW (ref 4.40–5.90)
RDW: 19.4 % — ABNORMAL HIGH (ref 11.5–14.5)
WBC: 21.4 10*3/uL — ABNORMAL HIGH (ref 3.8–10.6)

## 2015-10-09 LAB — C DIFFICILE QUICK SCREEN W PCR REFLEX
C Diff antigen: POSITIVE — AB
C Diff interpretation: POSITIVE
C Diff toxin: POSITIVE — AB

## 2015-10-10 LAB — AMMONIA: Ammonia: 74 umol/L — ABNORMAL HIGH (ref 9–35)

## 2015-10-10 LAB — AMYLASE: Amylase: 52 U/L (ref 28–100)

## 2015-10-10 LAB — LIPID PANEL
Cholesterol: 145 mg/dL (ref 0–200)
HDL: 34 mg/dL — ABNORMAL LOW (ref >40)
LDL Cholesterol: 87 mg/dL (ref 0–99)
Total CHOL/HDL Ratio: 4.3 RATIO
Triglycerides: 119 mg/dL (ref <150)
VLDL: 24 mg/dL (ref 0–40)

## 2015-10-10 LAB — LIPASE, BLOOD: Lipase: 28 U/L (ref 11–51)

## 2015-10-11 ENCOUNTER — Ambulatory Visit
Admission: RE | Admit: 2015-10-11 | Discharge: 2015-10-11 | Disposition: A | Discharge status: Home / Self Care | Payer: Medicare Other | Source: Ambulatory Visit | Attending: Internal Medicine | Admitting: Internal Medicine

## 2015-10-11 DIAGNOSIS — R1310 Dysphagia, unspecified: Secondary | ICD-10-CM

## 2015-10-12 LAB — HEPATITIS PANEL, ACUTE
HCV Ab: 0.1 s/co ratio (ref 0.0–0.9)
Hep A IgM: NEGATIVE
Hep B C IgM: NEGATIVE
Hepatitis B Surface Ag: NEGATIVE

## 2015-10-12 LAB — SYPHILIS: RPR W/REFLEX TO RPR TITER AND TREPONEMAL ANTIBODIES, TRADITIONAL SCREENING AND DIAGNOSIS ALGORITHM: RPR Ser Ql: NONREACTIVE

## 2015-10-15 DIAGNOSIS — F29 Unspecified psychosis not due to a substance or known physiological condition: Secondary | ICD-10-CM | POA: Diagnosis not present

## 2015-10-15 DIAGNOSIS — F419 Anxiety disorder, unspecified: Secondary | ICD-10-CM | POA: Diagnosis not present

## 2015-10-15 LAB — CBC WITH DIFFERENTIAL/PLATELET
Basophils Absolute: 0.1 10*3/uL (ref 0–0.1)
Basophils Relative: 1 %
Eosinophils Absolute: 0.2 10*3/uL (ref 0–0.7)
Eosinophils Relative: 2 %
HCT: 28.9 % — ABNORMAL LOW (ref 40.0–52.0)
Hemoglobin: 9.5 g/dL — ABNORMAL LOW (ref 13.0–18.0)
Lymphocytes Relative: 5 %
Lymphs Abs: 0.5 10*3/uL — ABNORMAL LOW (ref 1.0–3.6)
MCH: 28.3 pg (ref 26.0–34.0)
MCHC: 32.9 g/dL (ref 32.0–36.0)
MCV: 86 fL (ref 80.0–100.0)
Monocytes Absolute: 0.7 10*3/uL (ref 0.2–1.0)
Monocytes Relative: 6 %
Neutro Abs: 9.9 10*3/uL — ABNORMAL HIGH (ref 1.4–6.5)
Neutrophils Relative %: 86 %
Platelets: 315 10*3/uL (ref 150–440)
RBC: 3.35 MIL/uL — ABNORMAL LOW (ref 4.40–5.90)
RDW: 18.5 % — ABNORMAL HIGH (ref 11.5–14.5)
WBC: 11.4 10*3/uL — ABNORMAL HIGH (ref 3.8–10.6)

## 2015-10-15 LAB — COMPREHENSIVE METABOLIC PANEL
ALT: 24 U/L (ref 17–63)
AST: 26 U/L (ref 15–41)
Albumin: 2.8 g/dL — ABNORMAL LOW (ref 3.5–5.0)
Alkaline Phosphatase: 106 U/L (ref 38–126)
Anion gap: 9 (ref 5–15)
BUN: 18 mg/dL (ref 6–20)
CO2: 23 mmol/L (ref 22–32)
Calcium: 8.1 mg/dL — ABNORMAL LOW (ref 8.9–10.3)
Chloride: 105 mmol/L (ref 101–111)
Creatinine, Ser: 1.21 mg/dL (ref 0.61–1.24)
GFR calc Af Amer: >60 mL/min (ref >60)
GFR calc non Af Amer: 56 mL/min — ABNORMAL LOW (ref >60)
Glucose, Bld: 98 mg/dL (ref 65–99)
Potassium: 2.4 mmol/L — CL (ref 3.5–5.1)
Sodium: 137 mmol/L (ref 135–145)
Total Bilirubin: 0.4 mg/dL (ref 0.3–1.2)
Total Protein: 6 g/dL — ABNORMAL LOW (ref 6.5–8.1)

## 2015-10-15 LAB — AMMONIA: Ammonia: 29 umol/L (ref 9–35)

## 2015-10-22 LAB — C DIFFICILE QUICK SCREEN W PCR REFLEX
C Diff antigen: NEGATIVE
C Diff interpretation: NEGATIVE
C Diff toxin: NEGATIVE

## 2015-10-23 LAB — CBC WITH DIFFERENTIAL/PLATELET
Basophils Absolute: 0.1 10*3/uL (ref 0–0.1)
Basophils Relative: 1 %
Eosinophils Absolute: 0.1 10*3/uL (ref 0–0.7)
Eosinophils Relative: 2 %
HCT: 28.6 % — ABNORMAL LOW (ref 40.0–52.0)
Hemoglobin: 9.4 g/dL — ABNORMAL LOW (ref 13.0–18.0)
Lymphocytes Relative: 12 %
Lymphs Abs: 0.7 10*3/uL — ABNORMAL LOW (ref 1.0–3.6)
MCH: 29.2 pg (ref 26.0–34.0)
MCHC: 33 g/dL (ref 32.0–36.0)
MCV: 88.7 fL (ref 80.0–100.0)
Monocytes Absolute: 0.5 10*3/uL (ref 0.2–1.0)
Monocytes Relative: 7 %
Neutro Abs: 5 10*3/uL (ref 1.4–6.5)
Neutrophils Relative %: 78 %
Platelets: 404 10*3/uL (ref 150–440)
RBC: 3.23 MIL/uL — ABNORMAL LOW (ref 4.40–5.90)
RDW: 19.5 % — ABNORMAL HIGH (ref 11.5–14.5)
WBC: 6.4 10*3/uL (ref 3.8–10.6)

## 2015-10-23 LAB — BASIC METABOLIC PANEL
Anion gap: 9 (ref 5–15)
BUN: 12 mg/dL (ref 6–20)
CO2: 27 mmol/L (ref 22–32)
Calcium: 8.5 mg/dL — ABNORMAL LOW (ref 8.9–10.3)
Chloride: 105 mmol/L (ref 101–111)
Creatinine, Ser: 1.2 mg/dL (ref 0.61–1.24)
GFR calc Af Amer: >60 mL/min (ref >60)
GFR calc non Af Amer: 56 mL/min — ABNORMAL LOW (ref >60)
Glucose, Bld: 110 mg/dL — ABNORMAL HIGH (ref 65–99)
Potassium: 3.7 mmol/L (ref 3.5–5.1)
Sodium: 141 mmol/L (ref 135–145)

## 2015-10-24 ENCOUNTER — Encounter
Admission: RE | Admit: 2015-10-24 | Discharge: 2015-10-24 | Disposition: A | Discharge status: Home / Self Care | Payer: Medicare Other | Source: Ambulatory Visit | Attending: Internal Medicine | Admitting: Internal Medicine

## 2015-10-24 DIAGNOSIS — E039 Hypothyroidism, unspecified: Secondary | ICD-10-CM | POA: Insufficient documentation

## 2015-10-31 ENCOUNTER — Other Ambulatory Visit: Payer: Self-pay | Admitting: Family Medicine

## 2015-10-31 DIAGNOSIS — M549 Dorsalgia, unspecified: Secondary | ICD-10-CM

## 2015-10-31 LAB — TSH: TSH: 18.092 u[IU]/mL — ABNORMAL HIGH (ref 0.350–4.500)

## 2015-10-31 NOTE — Telephone Encounter (Signed)
Pt contacted office for refill request on the following medications:  oxyCODONE (ROXICODONE) 15 MG immediate release tablet.  [id number]

## 2015-11-01 MED ORDER — OXYCODONE HCL 15 MG PO TABS: 15.0000 mg | ORAL_TABLET | ORAL | Status: DC | PRN

## 2015-11-05 ENCOUNTER — Telehealth: Payer: Self-pay | Admitting: Family Medicine

## 2015-11-05 NOTE — Telephone Encounter (Signed)
The RX for the pain medication that ws picked up today was only for 30 tablets, pt states usually her get 180 tablets and he want to know why it has changed.

## 2015-11-05 NOTE — Telephone Encounter (Signed)
Please advise 

## 2015-11-05 NOTE — Telephone Encounter (Addendum)
Patient was notified. Patient expressed understanding. Patient scheduled appt for 11/12/2015.

## 2015-11-05 NOTE — Telephone Encounter (Signed)
He needs follow up office visit since he hasn't been seen since hospitalization In April.

## 2015-11-11 ENCOUNTER — Telehealth: Payer: Self-pay | Admitting: Family Medicine

## 2015-11-11 DIAGNOSIS — I890 Lymphedema, not elsewhere classified: Secondary | ICD-10-CM

## 2015-11-11 NOTE — Telephone Encounter (Signed)
Do they have home health coming out, or is this for outpatient PT?

## 2015-11-11 NOTE — Telephone Encounter (Signed)
Please advise 

## 2015-11-11 NOTE — Telephone Encounter (Signed)
Patient stated that he had called office to set up ov to discuss his medications with Dr. Caryn Section. Patient schedule appt for 11/12/2015 at 11:15.

## 2015-11-11 NOTE — Telephone Encounter (Signed)
Pt's wife [name] called stating that referral was made for physical therapy but pt was in hospital.When she recently tried to set up appointment at [facility] she was told they need new referral

## 2015-11-12 ENCOUNTER — Ambulatory Visit (INDEPENDENT_AMBULATORY_CARE_PROVIDER_SITE_OTHER): Payer: Medicare Other | Admitting: Family Medicine

## 2015-11-12 ENCOUNTER — Encounter: Payer: Self-pay | Admitting: Family Medicine

## 2015-11-12 ENCOUNTER — Other Ambulatory Visit: Payer: Self-pay | Admitting: Family Medicine

## 2015-11-12 VITALS — BP 122/62 | HR 116 | Temp 98.5°F | Resp 18 | Wt 177.0 lb

## 2015-11-12 DIAGNOSIS — D649 Anemia, unspecified: Secondary | ICD-10-CM | POA: Diagnosis not present

## 2015-11-12 DIAGNOSIS — I890 Lymphedema, not elsewhere classified: Secondary | ICD-10-CM | POA: Diagnosis not present

## 2015-11-12 DIAGNOSIS — I482 Chronic atrial fibrillation, unspecified: Secondary | ICD-10-CM

## 2015-11-12 DIAGNOSIS — I272 Other secondary pulmonary hypertension: Secondary | ICD-10-CM | POA: Diagnosis not present

## 2015-11-12 DIAGNOSIS — R195 Other fecal abnormalities: Secondary | ICD-10-CM | POA: Diagnosis not present

## 2015-11-12 DIAGNOSIS — K745 Biliary cirrhosis, unspecified: Secondary | ICD-10-CM | POA: Diagnosis not present

## 2015-11-12 DIAGNOSIS — M549 Dorsalgia, unspecified: Secondary | ICD-10-CM

## 2015-11-12 MED ORDER — OXYCODONE HCL 15 MG PO TABS: 15.0000 mg | ORAL_TABLET | ORAL | Status: DC | PRN

## 2015-11-12 MED ORDER — OXYCODONE HCL 15 MG PO TABS: 15.0000 mg | ORAL_TABLET | Freq: Four times a day (QID) | ORAL | Status: DC | PRN

## 2015-11-12 NOTE — Telephone Encounter (Signed)
Patient was referred to lymphedema clinic in April, but was hospitalized before his appointment. Can this referral be rescheduled. Thanks.

## 2015-11-12 NOTE — Progress Notes (Signed)
Patient: Vincent Mason Male    DOB: [birth year]   80 y.o.   MRN: [id number] Visit Date: 11/12/2015  Today's Provider: Lelon Huh, MD   Chief Complaint  Patient presents with  . Hospitalization Follow-up   Subjective:    HPI  Follow up Hospitalization  Patient was admitted to [facility] on 09/03/2015 and discharged on 09/17/2015. He was treated for Enterocolitis due to pneumonia, C. Diff, acute on chronic CHF, Hypertensive heart and chronic kidney disease with heart failure, anemia.  He was discharged to rehab from which he was discharged on 10/30/2015 He reports good compliance with treatment. He reports this condition is initially improved but how worsening again.  He did not bring his medications today, but he and his wife state he is taking medications he was on prior to hospitalization, but not new medications on d/c summary.  Per d/c medication list he was being prescribed fludrocortisone 0.1mg  for hyponatremia, magnesium, Florastor for C diff, and levothyroxine  He had been on xarelto for atrial fibrillation, but this was discontinued due to anemia and blood pre rectum. There was also incidental finding of cirrhotic changes to liver and abdominal CT. Finally he was discharged on Spirva and Advair due to pneumonia, but he has not been using any inhalers since d/c from rehab. He states he does have mild cough, but no dyspnea.   He also has history of chronic edema secondary to CHF and pulmonary hypertension. Prior to hospitalization he had been referred to lymphedema clinic, but was hospitalized before he was seen.   ------------------------------------------------------------------------------------ Lab Results  Component Value Date   WBC 6.4 10/23/2015   HGB 9.4* 10/23/2015   HCT 28.6* 10/23/2015   MCV 88.7 10/23/2015   PLT 404 10/23/2015   Lab Results  Component Value Date   TSH 18.092* 10/31/2015   CMP Latest Ref Rng 10/23/2015 10/15/2015 10/09/2015  Glucose 65  - 99 mg/dL 110(H) 98 42(LL)  BUN 6 - 20 mg/dL 12 18 31(H)  Creatinine 0.61 - 1.24 mg/dL 1.20 1.21 1.67(H)  Sodium 135 - 145 mmol/L 141 137 136  Potassium 3.5 - 5.1 mmol/L 3.7 2.4(LL) 4.1  Chloride 101 - 111 mmol/L 105 105 95(L)  CO2 22 - 32 mmol/L 27 23 24   Calcium 8.9 - 10.3 mg/dL 8.5(L) 8.1(L) 8.5(L)  Total Protein 6.5 - 8.1 g/dL - 6.0(L) -  Total Bilirubin 0.3 - 1.2 mg/dL - 0.4 -  Alkaline Phos 38 - 126 U/L - 106 -  AST 15 - 41 U/L - 26 -  ALT 17 - 63 U/L - 24 -        Allergies  Allergen Reactions  . Decongestant  [Oxymetazoline]    Current Meds  Medication Sig  . benazepril (LOTENSIN) 20 MG tablet Take 1 tablet by mouth daily. Reported on 07/08/2015  . Coenzyme Q10 (COQ10) 200 MG CAPS Take 1 capsule by mouth daily.  . colchicine 0.6 MG tablet Take 1 tablet (0.6 mg total) by mouth daily as needed (for gout).  . diazepam (VALIUM) 2 MG tablet Take 1 tablet (2 mg total) by mouth every 8 (eight) hours as needed for anxiety. Reported on 07/08/2015  . Digoxin 62.5 MCG TABS Take 0.0625 mg by mouth daily.  . famotidine (PEPCID) 20 MG tablet Take 1 tablet (20 mg total) by mouth daily.  . Febuxostat (ULORIC) 80 MG TABS Take 1 tablet (80 mg total) by mouth daily.  Marland Kitchen gabapentin (NEURONTIN) 100 MG capsule Take 1  capsule (100 mg total) by mouth 3 (three) times daily.  . hydrocortisone-pramoxine (PROCTOFOAM-HC) rectal foam Place 1 applicator rectally 2 (two) times daily.  Marland Kitchen ipratropium-albuterol (DUONEB) 0.5-2.5 (3) MG/3ML SOLN Take 3 mLs by nebulization every 6 (six) hours as needed.  . lidocaine (XYLOCAINE) 2 % solution Use as directed 15 mLs in the mouth or throat every 3 (three) hours as needed for mouth pain.  . Maltodextrin-Xanthan Gum (RESOURCE THICKENUP CLEAR) POWD Nectar thicken consistency  . metolazone (ZAROXOLYN) 5 MG tablet Take 2 tablets by mouth daily.  . OXcarbazepine (TRILEPTAL) 150 MG tablet Take 1 tablet (150 mg total) by mouth 2 (two) times daily. Prescribed by Dr. Melrose Nakayama    . oxyCODONE (ROXICODONE) 15 MG immediate release tablet Take 1 tablet (15 mg total) by mouth every 4 (four) hours as needed for pain.  . potassium chloride SA (K-DUR,KLOR-CON) 20 MEQ tablet Take 2 tablets (40 mEq total) by mouth 2 (two) times daily.  . ranitidine (ZANTAC) 150 MG tablet Take 1 tablet by mouth 2 (two) times daily as needed. As needed for stomach burning  . rOPINIRole (REQUIP) 4 MG tablet TAKE ONE TABLET BY MOUTH EVERY NIGHT AT BEDTIME FOR RESTLESS LEG SYNDROME  . rosuvastatin (CRESTOR) 40 MG tablet TAKE ONE (1) TABLET EACH DAY *GENERIC CRESTOR  . torsemide (DEMADEX) 20 MG tablet 2 tablets once or twice a day for swelling (Patient taking differently: Take 40 mg by mouth 2 (two) times daily. )  . zolpidem (AMBIEN) 5 MG tablet Take 1 tablet (5 mg total) by mouth at bedtime. Reported on 07/08/2015    Review of Systems  Constitutional: Positive for activity change, appetite change and fatigue. Negative for fever and chills.  Respiratory: Negative for chest tightness, shortness of breath and wheezing.   Cardiovascular: Negative for chest pain and palpitations.  Gastrointestinal: Negative for nausea, vomiting and abdominal pain.  Neurological: Positive for dizziness and weakness.  Psychiatric/Behavioral: Positive for behavioral problems (change in behavior) and confusion.       Easily dowses off to sleep     Social History  Substance Use Topics  . Smoking status: Former Research scientist (life sciences)  . Smokeless tobacco: Not on file     Comment: Quit in 1990  . Alcohol Use: No   Objective:   BP 122/62 mmHg  Pulse 116  Temp(Src) 98.5 F (36.9 C) (Oral)  Resp 18  Wt 177 lb (80.287 kg)  Physical Exam   General Appearance:    Alert, cooperative, no distress  Eyes:    PERRL, conjunctiva/corneas clear, EOM's intact       Lungs:     Clear to auscultation bilaterally, respirations unlabored  Heart:     Irregularly irregular rhythm.tachycardic  Neurologic:   Awake, alert, oriented x 3. No apparent  focal neurological           defect.   Ext:  3+ bilateral LE edema.        Assessment & Plan:     1. Chronic atrial fibrillation (HCC) If anemia is resolved will stay on Xarelto - T4 AND TSH  2. Fecal occult blood test positive Likely infectious, no gross blood per rectum, is up to date on screening colonoscopy  3. Biliary cirrhosis ([place]) suspected from abdominal ct Consider GI referral after reviewing ultrasound - Comprehensive metabolic panel - US Abdomen Limited RUQ; Future  4. Hypertensive pulmonary vascular disease (HCC) Stable. Continue current diuretics for now. Check elctrolytes.   5. Hypomagnesemia  - Magnesium  6. Lymphedema Reschedule referral  to lymphedema clinic  7. Normocytic anemia  - CBC  8. Back pain, unspecified location Chronic.  - oxyCODONE (ROXICODONE) 15 MG immediate release tablet; Take 1 tablet (15 mg total) by mouth every 6 (six) hours as needed for pain.  Dispense: 120 tablet; Refill: 0     Addressed extensive list of chronic and acute medical problems today requiring extensive time in counseling and coordination care.  Over half of this 45 minute visit were spent in counseling and coordinating care of multiple medical problems.   Lelon Huh, MD  [facility]

## 2015-11-13 LAB — CBC
Hematocrit: 32.6 % — ABNORMAL LOW (ref 37.5–51.0)
Hemoglobin: 10.2 g/dL — ABNORMAL LOW (ref 12.6–17.7)
MCH: 28.8 pg (ref 26.6–33.0)
MCHC: 31.3 g/dL — ABNORMAL LOW (ref 31.5–35.7)
MCV: 92 fL (ref 79–97)
Platelets: 322 10*3/uL (ref 150–379)
RBC: 3.54 x10E6/uL — ABNORMAL LOW (ref 4.14–5.80)
RDW: 18.8 % — ABNORMAL HIGH (ref 12.3–15.4)
WBC: 15.7 10*3/uL — ABNORMAL HIGH (ref 3.4–10.8)

## 2015-11-13 LAB — SPECIMEN STATUS REPORT

## 2015-11-13 LAB — T4 AND TSH
T4, Total: 2.6 ug/dL — ABNORMAL LOW (ref 4.5–12.0)
TSH: 12.24 u[IU]/mL — ABNORMAL HIGH (ref 0.450–4.500)

## 2015-11-13 LAB — MAGNESIUM: Magnesium: 1.7 mg/dL (ref 1.6–2.3)

## 2015-11-14 ENCOUNTER — Ambulatory Visit
Admission: RE | Admit: 2015-11-14 | Discharge: 2015-11-14 | Disposition: A | Discharge status: Home / Self Care | Payer: Medicare Other | Source: Ambulatory Visit | Attending: Family Medicine | Admitting: Family Medicine

## 2015-11-14 ENCOUNTER — Telehealth: Payer: Self-pay | Admitting: *Deleted

## 2015-11-14 DIAGNOSIS — K745 Biliary cirrhosis, unspecified: Secondary | ICD-10-CM | POA: Diagnosis not present

## 2015-11-14 DIAGNOSIS — D72829 Elevated white blood cell count, unspecified: Secondary | ICD-10-CM

## 2015-11-14 DIAGNOSIS — K7460 Unspecified cirrhosis of liver: Secondary | ICD-10-CM | POA: Diagnosis not present

## 2015-11-14 LAB — COMPREHENSIVE METABOLIC PANEL
ALT: 9 IU/L (ref 0–44)
AST: 14 IU/L (ref 0–40)
Albumin/Globulin Ratio: 1.3 (ref 1.2–2.2)
Albumin: 4.4 g/dL (ref 3.5–4.8)
Alkaline Phosphatase: 85 IU/L (ref 39–117)
BUN/Creatinine Ratio: 13 (ref 10–24)
BUN: 36 mg/dL — ABNORMAL HIGH (ref 8–27)
Bilirubin Total: 0.2 mg/dL (ref 0.0–1.2)
CO2: 22 mmol/L (ref 18–29)
Calcium: 9.9 mg/dL (ref 8.6–10.2)
Chloride: 87 mmol/L — ABNORMAL LOW (ref 96–106)
Creatinine, Ser: 2.85 mg/dL — ABNORMAL HIGH (ref 0.76–1.27)
GFR calc Af Amer: 23 mL/min/{1.73_m2} — ABNORMAL LOW (ref >59)
GFR calc non Af Amer: 20 mL/min/{1.73_m2} — ABNORMAL LOW (ref >59)
Globulin, Total: 3.5 g/dL (ref 1.5–4.5)
Glucose: 107 mg/dL — ABNORMAL HIGH (ref 65–99)
Potassium: 4.1 mmol/L (ref 3.5–5.2)
Sodium: 140 mmol/L (ref 134–144)
Total Protein: 7.9 g/dL (ref 6.0–8.5)

## 2015-11-14 MED ORDER — LEVOFLOXACIN 500 MG PO TABS: 500.0000 mg | ORAL_TABLET | Freq: Every day | ORAL | Status: DC

## 2015-11-14 NOTE — Telephone Encounter (Signed)
Pt has been rescheduled in the lymphedema clinic for 11/18/15 at 1:00 but they will need new order entered in [facility]

## 2015-11-14 NOTE — Telephone Encounter (Signed)
-----   Message from Birdie Sons, MD sent at 11/14/2015  8:07 AM EDT ----- Labs show his kidneys are functioning poorly, and he is very hyPOthyroid. Please check to to see if is taking levothyroxine that was started when he was in rehab. Need to drink 7-8 glasses or water or Gatorade per day. Put torsemide on hold for 1-2 days.  He also has elevated white blood cell count which is usually an indication of infection. Need to start levofloxacin 500mg  daily, #7.   Need to recheck panel on Monday

## 2015-11-14 NOTE — Telephone Encounter (Signed)
Patient's wife [name] was notified of results. Expressed understanding. Rx sent to pharmacy. Labs ordered.

## 2015-11-14 NOTE — Telephone Encounter (Signed)
Referral in epic

## 2015-11-18 ENCOUNTER — Other Ambulatory Visit: Payer: Self-pay | Admitting: Family Medicine

## 2015-11-18 ENCOUNTER — Ambulatory Visit: Payer: Medicare Other | Admitting: Occupational Therapy

## 2015-11-18 DIAGNOSIS — D72829 Elevated white blood cell count, unspecified: Secondary | ICD-10-CM | POA: Diagnosis not present

## 2015-11-19 LAB — CBC
Hematocrit: 28.5 % — ABNORMAL LOW (ref 37.5–51.0)
Hemoglobin: 9.2 g/dL — ABNORMAL LOW (ref 12.6–17.7)
MCH: 29.5 pg (ref 26.6–33.0)
MCHC: 32.3 g/dL (ref 31.5–35.7)
MCV: 91 fL (ref 79–97)
Platelets: 267 10*3/uL (ref 150–379)
RBC: 3.12 x10E6/uL — ABNORMAL LOW (ref 4.14–5.80)
RDW: 18.7 % — ABNORMAL HIGH (ref 12.3–15.4)
WBC: 8.2 10*3/uL (ref 3.4–10.8)

## 2015-11-19 LAB — RENAL FUNCTION PANEL
Albumin: 3.9 g/dL (ref 3.5–4.8)
BUN/Creatinine Ratio: 16 (ref 10–24)
BUN: 26 mg/dL (ref 8–27)
CO2: 26 mmol/L (ref 18–29)
Calcium: 9 mg/dL (ref 8.6–10.2)
Chloride: 103 mmol/L (ref 96–106)
Creatinine, Ser: 1.58 mg/dL — ABNORMAL HIGH (ref 0.76–1.27)
GFR calc Af Amer: 48 mL/min/{1.73_m2} — ABNORMAL LOW (ref >59)
GFR calc non Af Amer: 41 mL/min/{1.73_m2} — ABNORMAL LOW (ref >59)
Glucose: 92 mg/dL (ref 65–99)
Phosphorus: 2.4 mg/dL — ABNORMAL LOW (ref 2.5–4.5)
Potassium: 4.8 mmol/L (ref 3.5–5.2)
Sodium: 142 mmol/L (ref 134–144)

## 2015-11-20 ENCOUNTER — Ambulatory Visit: Payer: Medicare Other | Admitting: Occupational Therapy

## 2015-11-21 ENCOUNTER — Encounter: Payer: Self-pay | Admitting: Family Medicine

## 2015-11-21 ENCOUNTER — Ambulatory Visit (INDEPENDENT_AMBULATORY_CARE_PROVIDER_SITE_OTHER): Payer: Medicare Other | Admitting: Family Medicine

## 2015-11-21 ENCOUNTER — Telehealth: Payer: Self-pay

## 2015-11-21 VITALS — BP 142/52 | HR 68 | Temp 97.7°F | Resp 16 | Wt 189.0 lb

## 2015-11-21 DIAGNOSIS — K745 Biliary cirrhosis, unspecified: Secondary | ICD-10-CM

## 2015-11-21 DIAGNOSIS — R233 Spontaneous ecchymoses: Secondary | ICD-10-CM

## 2015-11-21 DIAGNOSIS — K7689 Other specified diseases of liver: Secondary | ICD-10-CM

## 2015-11-21 NOTE — Telephone Encounter (Signed)
Patient advised and agrees to referral. Please schedule. Thanks 

## 2015-11-21 NOTE — Progress Notes (Signed)
Patient: Vincent Mason Male    DOB: September 10, 1935   80 y.o.   MRN: [id number] Visit Date: 11/21/2015  Today's Provider: Vernie Murders, PA   Chief Complaint  Patient presents with  . Red spots on both arms   Subjective:    HPI Patient comes in today c/o dark red spots located on both of his arms. Patient reports that he has had the spots X 1 week.   Patient denies any pain, tenderness, or itching.  Past Medical History  Diagnosis Date  . Hypertension   . Hyperlipidemia   . COPD (chronic obstructive pulmonary disease) ([place])   . Kidney disease, chronic, stage III (moderate, EGFR 30-59 ml/min)     ARF 2013  . Atrial fibrillation and flutter [facility]) 2013    Dr Nehemiah Massed at [facility]  on [place].   . OSA (obstructive sleep apnea)     non-compliant with CPAP.    Patient Active Problem List   Diagnosis Date Noted  . Hypomagnesemia 09/14/2015  . Oral thrush 09/14/2015  . Acute respiratory failure with hypoxia ([place]) 09/12/2015  . HCAP (healthcare-associated pneumonia) 09/12/2015  . C. difficile colitis 09/12/2015  . Acute on chronic diastolic CHF (congestive heart failure), NYHA class 1 ([place]) 09/12/2015  . Severe sepsis with septic shock ([place]) 09/12/2015  . Chronic atrial fibrillation ([place]) 09/12/2015  . Essential hypertension 09/12/2015  . ATN (acute tubular necrosis) ([place]) 09/12/2015  . Fecal occult blood test positive 09/12/2015  . Hepatic cirrhosis ([place])   . Abdominal pain   . Status post peripherally inserted central catheter (PICC) central line placement   . Acute on chronic renal failure ([place]) 09/03/2015  . Normocytic anemia 09/03/2015  . Opioid dependence ([place]) 09/03/2015  . Pulmonary arterial hypertension ([place]) 09/03/2015  . Acute encephalopathy 09/03/2015  . Open leg wound 09/02/2015  . Lymphedema 08/26/2015  . Hypokalemia 08/26/2015  . Hyperuricemia 07/09/2015  . Coronary artery disease 01/22/2015  . Chronic kidney disease (CKD), stage III (moderate) 01/22/2015    . Chronic constipation 01/22/2015  . COPD (chronic obstructive pulmonary disease) ([place]) 01/22/2015  . ED (erectile dysfunction) of organic origin 01/22/2015  . Lymphedema of both lower extremities 01/22/2015  . Fatigue 01/22/2015  . Blood in the urine 01/22/2015  . Calculus of kidney 01/22/2015  . Memory loss 01/22/2015  . Hypertensive pulmonary vascular disease ([place]) 01/22/2015  . Displacement of cervical intervertebral disc without myelopathy 01/22/2015  . Seizures ([place]) 01/17/2015  . Obstructive sleep apnea 01/17/2015  . Hyperlipidemia 01/17/2015  . Leg swelling 01/17/2015  . Degenerative disc disease, lumbar 01/17/2015  . Back pain 11/12/2014  . TI (tricuspid incompetence) 08/02/2014   Past Surgical History  Procedure Laterality Date  . Hyperplastic colon polyp  02/2002    Sigmoid polyps  . Coronary artery bypass graft  10/1997  . History of cervical discectomy  2009    C5-C6   No family history on file.  Allergies  Allergen Reactions  . Decongestant  [Oxymetazoline]    Current Meds  Medication Sig  . benazepril (LOTENSIN) 20 MG tablet Take 1 tablet by mouth daily. Reported on 07/08/2015  . Coenzyme Q10 (COQ10) 200 MG CAPS Take 1 capsule by mouth daily.  . colchicine 0.6 MG tablet Take 1 tablet (0.6 mg total) by mouth daily as needed (for gout).  . Digoxin 62.5 MCG TABS Take 0.0625 mg by mouth daily.  . famotidine (PEPCID) 20 MG tablet Take 1 tablet (20 mg total) by mouth daily.  Marland Kitchen  Febuxostat (ULORIC) 80 MG TABS Take 1 tablet (80 mg total) by mouth daily.  Marland Kitchen gabapentin (NEURONTIN) 100 MG capsule Take 1 capsule (100 mg total) by mouth 3 (three) times daily.  Marland Kitchen ipratropium-albuterol (DUONEB) 0.5-2.5 (3) MG/3ML SOLN Take 3 mLs by nebulization every 6 (six) hours as needed.  . potassium chloride SA (K-DUR,KLOR-CON) 20 MEQ tablet Take 2 tablets (40 mEq total) by mouth 2 (two) times daily.  . ranitidine (ZANTAC) 150 MG tablet Take 1 tablet by mouth 2 (two) times daily as  needed. As needed for stomach burning  . rOPINIRole (REQUIP) 4 MG tablet TAKE ONE TABLET BY MOUTH EVERY NIGHT AT BEDTIME FOR RESTLESS LEG SYNDROME  . rosuvastatin (CRESTOR) 40 MG tablet TAKE ONE (1) TABLET EACH DAY *GENERIC CRESTOR  . torsemide (DEMADEX) 20 MG tablet 2 tablets once or twice a day for swelling (Patient taking differently: Take 40 mg by mouth 2 (two) times daily. )  . zolpidem (AMBIEN) 5 MG tablet Take 1 tablet (5 mg total) by mouth at bedtime. Reported on 07/08/2015    Review of Systems  Constitutional: Negative.   HENT: Negative.   Respiratory: Negative.   Cardiovascular: Negative.   Genitourinary: Negative.   Skin: Positive for color change.       Patient has dark red spots located throughout both of his arms.   Neurological: Negative.     Social History  Substance Use Topics  . Smoking status: Former Research scientist (life sciences)  . Smokeless tobacco: Not on file     Comment: Quit in 1990  . Alcohol Use: No   Objective:   BP 142/52 mmHg  Pulse 68  Temp(Src) 97.7 F (36.5 C)  Resp 16  Wt 189 lb (85.73 kg)  Physical Exam  Constitutional: He is oriented to person, place, and time. He appears well-developed and well-nourished. No distress.  HENT:  Head: Normocephalic and atraumatic.  Right Ear: Hearing normal.  Left Ear: Hearing normal.  Nose: Nose normal.  Eyes: Conjunctivae and lids are normal. Right eye exhibits no discharge. Left eye exhibits no discharge. No scleral icterus.  Cardiovascular: Normal rate and regular rhythm.   Pulmonary/Chest: Effort normal and breath sounds normal. No respiratory distress.  Abdominal: Soft. Bowel sounds are normal.  Musculoskeletal: Normal range of motion.  Neurological: He is alert and oriented to person, place, and time.  Skin: Skin is intact. Rash noted. No lesion noted.  Red petechial rash on both arms. No itching or blistering.   Psychiatric: He has a normal mood and affect. His speech is normal and behavior is normal. Thought content  normal.      Assessment & Plan:     1. Petechial rash Recent last showed improvement in creatinine with normal liver enzymes. Hgb low (anemia). Suspect rash secondary to liver disease with anemia. Normal platelets. Proceed with evaluation by gastroenterologist.  2. Biliary cirrhosis ([place]) Has an appointment with Dr. Allen Norris (gastroenterologist) on 12-12-15 to evaluate nodular contour of liver from ultrasound on 11-14-15. Denies pain or nausea.     Vernie Murders, PA  [facility]

## 2015-11-21 NOTE — Telephone Encounter (Signed)
-----   Message from Birdie Sons, MD sent at 11/17/2015 11:33 AM EDT ----- Ultrasound shows liver nodule which may be caused by cirrhosis. Need referral to Dr. Allen Norris GI for further evaluation.

## 2015-11-25 ENCOUNTER — Other Ambulatory Visit: Payer: Self-pay | Admitting: Family Medicine

## 2015-11-25 MED ORDER — METOLAZONE 5 MG PO TABS: 5.0000 mg | ORAL_TABLET | Freq: Every day | ORAL | Status: DC | PRN

## 2015-11-25 NOTE — Telephone Encounter (Addendum)
Pt contacted office for refill request on the following medications:  metolazone (ZAROXOLYN) 5 MG tablet.  Vincent Mason.  CB#[phone]/MW  Pt is completely out/MW

## 2015-11-28 ENCOUNTER — Ambulatory Visit: Payer: Medicare Other | Attending: Family Medicine | Admitting: Occupational Therapy

## 2015-11-28 ENCOUNTER — Encounter: Payer: Self-pay | Admitting: Occupational Therapy

## 2015-11-28 DIAGNOSIS — I890 Lymphedema, not elsewhere classified: Secondary | ICD-10-CM | POA: Insufficient documentation

## 2015-11-28 NOTE — Patient Instructions (Signed)
LE instructions and precautions as established- see initial eval.   

## 2015-11-28 NOTE — Therapy (Signed)
[place] MAIN [facility] SERVICES [address] [place], [place], [address] Phone: [phone]   Fax:  [phone]  Occupational Therapy Evaluation  Patient Details  Name: Vincent Mason MRN: [id number] Date of Birth: 1935/11/29 No Data Recorded  Encounter Date: 11/28/2015      OT End of Session - 11/28/15 1443    Visit Number 1   Number of Visits 36   Date for OT Re-Evaluation 02/26/16   OT Start Time 0105   OT Stop Time 0157   OT Time Calculation (min) 52 min   Equipment Utilized During Treatment Lymphedema Management Self-Care Workbook   Activity Tolerance Treatment limited secondary to medical complications (Comment);Patient tolerated treatment well;No increased pain   Behavior During Therapy [facility] for tasks assessed/performed      Past Medical History  Diagnosis Date  . Hypertension   . Hyperlipidemia   . COPD (chronic obstructive pulmonary disease) ([place])   . Kidney disease, chronic, stage III (moderate, EGFR 30-59 ml/min)     ARF 2013  . Atrial fibrillation and flutter [facility]) 2013    Dr Nehemiah Massed at [facility]  on [place].   . OSA (obstructive sleep apnea)     non-compliant with CPAP.     Past Surgical History  Procedure Laterality Date  . Hyperplastic colon polyp  02/2002    Sigmoid polyps  . Coronary artery bypass graft  10/1997  . History of cervical discectomy  2009    C5-C6    There were no vitals filed for this visit.      Subjective Assessment - 11/28/15 1401    Subjective  Pt is referred again by Virl Diamond. Fisher, MD for Occupational Therapy for evaluation and treatment of BLE lymphedema secondary. Pt was originally evaluated and discharged on 09/02/2015 due to moderate lymphorhhea and open leg wounds. Pt was subsequently hospitalized. Pt returns today in hopes of controlling leg swelling. He tells me swelling was well controlled in the hospital, but since returning home  and increasing activity level his legs swelling has  gotten progressively worse. Pt tells me he has been unable to tolerate compression garments in the past.   Patient is accompained by: Family member   Pertinent History LE PRECAUTIONS/ CDT CONTRAINDICATIONS: uncontrolled CHF, COPD, AFib, CAD, HCC, Valve incompetence, CKD (stage III),    Limitations leg swelling, leg pain,difficulty walking, decreased endurance and activity tolerance (SOB during exertion), decreased functional mobiliity and transfers, needs assistance with all basic and instrumental ADLs,  difficulty fitting lowewr body clothing and street clothes, decreased alertness, decreased swallowing ability ( by spouse report),    Patient Stated Goals Decrease swelling and keep it from getting worse   Currently in Pain? Yes   Pain Score --  not rated numerically   Pain Location Leg   Pain Orientation Right;Left   Pain Descriptors / Indicators Discomfort;Guarding;Heaviness;Pressure;Sore;Tender;Tightness   Pain Type Chronic pain   Pain Onset More than a month ago   Pain Frequency Intermittent   Aggravating Factors  walking, standing, dependent positioning   Pain Relieving Factors elevation, diuretic   Effect of Pain on Daily Activities limits ambulation and transfers, Limits participation in ADLs, productive activities, leisure pursuits, and limiits participation in family, social and community activities, limits ability to wear LB clothing and shoes    Multiple Pain Sites Yes                            OT Education -  11/28/15 1442    Education Details Provided Pt/caregiver skilled education and ADL training throughout visit for lymphedema etiology, progression, and treatment including Intensive and Management Phase Complete Decongestive Therapy (CDT)  Discussed lymphedema precautions, cellulitis risk, and all CDT and LE self-care components, including compression wrapping/ garments & devices, lymphatic pumping ther ex, simple self-MLD, and skin care. Provided printed  Lymphedema Workbook for reference.   Person(s) Educated Patient;Spouse   Methods Explanation;Handout;Demonstration   Comprehension Verbalized understanding;Need further instruction             OT Long Term Goals - 11/28/15 1501    OT LONG TERM GOAL #1   Title Patient to demonstrate understanding of lymphedema etiology, progression, treatment  plan, and LE precautions to limit infection risk and progression.   Baseline dependent   Time 2   Period Weeks   Status New   OT LONG TERM GOAL #2   Title Lymphedema (LE) self-care: Pt able to apply multi layered, gradient compression wraps independently using proper techniques with Max caregiver assistance to achieve optimal limb volume reduction and to limit LE progression.   Baseline dependent   Time 12   Period Weeks   Status New   OT LONG TERM GOAL #3   Title Lymphedema (LE) self-care:  Pt to achieve at least 10% LLE limb volume reductions during Intensive CDT to limit LE progression, decrease infection risk, to reduce pain/ discomfort, and to improve safe ambulation and functional mobility.   Baseline dependent   Time 12   Period Weeks   Status New   OT LONG TERM GOAL #4   Title Lymphedema (LE) self-care:  Pt >/= 85 % compliant with all daily, LE self-care protocols for home program with Max caregiver assistance, including simple self-manual lymphatic drainage (MLD), skin care, lymphatic pumping the ex, skin care, and donning/ doffing compression wraps and garments o limit LE progression and further functional decline.     Baseline dependent   Time 12   Period Weeks   Status New   OT LONG TERM GOAL #5   Title Lymphedema (LE) self-care:  Pt to tolerate daily compression wraps, garments and devices in keeping w/ prescribed wear regime within 1 week of issue date to progress and retain clinical and functional gains and to limit LE progression.   Baseline dependent   Time 12   Period Weeks   Status New   Long Term Additional Goals    Additional Long Term Goals Yes   OT LONG TERM GOAL #6   Title Lymphedema (LE) self-care:  During Management Phase CDT Pt to sustain limb volume reductions achieved during Intensive Phase CDT within 5% utilizing LE self-care protocols, appropriate compression garments/ devices, and needed level of caregiver assistance.   Baseline dependent   Time 6   Period Months   Status New               Plan - 11/28/15 1444    Clinical Impression Statement Pt presents with severe, stage II, BLE lymphedema 2/2 a complex constellation of contributing medical conditions, including AFib, suspected vascular insufficiency, and end CKD ( stage III). Pt reporting worsening leg swelling after recent 2 month hospitalization with increasing activity level.  Chronic leg swelling and associated pain and sesory discomfort limits Pt's functional independence with self care and instrumental ADLs,  llimits ability to participate in productive activities and prefered liezure pursuits, limits  social participation in family, social and community  activities, and  contributes to significant infection risk,  Without skilled Occupational Therapy for Intensive and Management phase Complete Decongestive Therapy (CDT) this patient's condition is likely to worsen and further functional decline is expected.   Rehab Potential Fair   OT Treatment/Interventions Self-care/ADL training;DME and/or AE instruction;Manual lymph drainage;Patient/family education;Compression bandaging;Therapeutic exercises;Therapeutic exercise;Therapeutic activities;Energy conservation;Manual Therapy      Patient will benefit from skilled therapeutic intervention in order to improve the following deficits and impairments:  Decreased knowledge of use of DME, Decreased skin integrity, Increased edema, Cardiopulmonary status limiting activity, Decreased mobility, Other (comment), Decreased activity tolerance, Decreased endurance, Decreased strength, Decreased  knowledge of precautions, Difficulty walking, Impaired flexibility, Pain, Impaired sensation, Decreased balance (decreased alerness and memory)  Visit Diagnosis: Lymphedema, not elsewhere classified - Plan: Ot plan of care cert/re-cert      G-Codes - 95/28/41 21-Jul-1506    Functional Assessment Tool Used Clinical observation and examination, medical records review, Pt and caregiver interview, comparative limb volumetrics   Functional Limitation Self care   Self Care Current Status [phone]) 100 percent impaired, limited or restricted   Self Care Goal Status (N0272) At least 20 percent but less than 40 percent impaired, limited or restricted      Problem List Patient Active Problem List   Diagnosis Date Noted  . Hypomagnesemia 09/14/2015  . Oral thrush 09/14/2015  . Acute respiratory failure with hypoxia ([place]) 09/12/2015  . HCAP (healthcare-associated pneumonia) 09/12/2015  . C. difficile colitis 09/12/2015  . Acute on chronic diastolic CHF (congestive heart failure), NYHA class 1 ([place]) 09/12/2015  . Severe sepsis with septic shock ([place]) 09/12/2015  . Chronic atrial fibrillation ([place]) 09/12/2015  . Essential hypertension 09/12/2015  . ATN (acute tubular necrosis) ([place]) 09/12/2015  . Fecal occult blood test positive 09/12/2015  . Hepatic cirrhosis ([place])   . Abdominal pain   . Status post peripherally inserted central catheter (PICC) central line placement   . Acute on chronic renal failure ([place]) 09/03/2015  . Normocytic anemia 09/03/2015  . Opioid dependence ([place]) 09/03/2015  . Pulmonary arterial hypertension ([place]) 09/03/2015  . Acute encephalopathy 09/03/2015  . Open leg wound 09/02/2015  . Lymphedema 08/26/2015  . Hypokalemia 08/26/2015  . Hyperuricemia 07/09/2015  . Coronary artery disease 01/22/2015  . Chronic kidney disease (CKD), stage III (moderate) 01/22/2015  . Chronic constipation 01/22/2015  . COPD (chronic obstructive pulmonary disease) ([place]) 01/22/2015  . ED (erectile  dysfunction) of organic origin 01/22/2015  . Lymphedema of both lower extremities 01/22/2015  . Fatigue 01/22/2015  . Blood in the urine 01/22/2015  . Calculus of kidney 01/22/2015  . Memory loss 01/22/2015  . Hypertensive pulmonary vascular disease ([place]) 01/22/2015  . Displacement of cervical intervertebral disc without myelopathy 01/22/2015  . Seizures ([place]) 01/17/2015  . Obstructive sleep apnea 01/17/2015  . Hyperlipidemia 01/17/2015  . Leg swelling 01/17/2015  . Degenerative disc disease, lumbar 01/17/2015  . Back pain 11/12/2014  . TI (tricuspid incompetence) 08/02/2014    Andrey Spearman, MS, OTR/L, [facility] 11/28/2015 3:14 PM  [place] MAIN [facility] SERVICES [address] [place], [place], [address] Phone: [phone]   Fax:  [phone]  Name: Vincent Mason MRN: [id number] Date of Birth: [birth year]

## 2015-12-05 ENCOUNTER — Ambulatory Visit: Payer: Medicare Other | Admitting: Occupational Therapy

## 2015-12-10 DIAGNOSIS — R0602 Shortness of breath: Secondary | ICD-10-CM | POA: Diagnosis not present

## 2015-12-10 DIAGNOSIS — I482 Chronic atrial fibrillation: Secondary | ICD-10-CM | POA: Diagnosis not present

## 2015-12-10 DIAGNOSIS — I25810 Atherosclerosis of coronary artery bypass graft(s) without angina pectoris: Secondary | ICD-10-CM | POA: Diagnosis not present

## 2015-12-10 DIAGNOSIS — R600 Localized edema: Secondary | ICD-10-CM | POA: Diagnosis not present

## 2015-12-11 ENCOUNTER — Ambulatory Visit: Payer: Medicare Other | Admitting: Occupational Therapy

## 2015-12-11 DIAGNOSIS — I890 Lymphedema, not elsewhere classified: Secondary | ICD-10-CM | POA: Diagnosis not present

## 2015-12-11 NOTE — Therapy (Signed)
[place] MAIN [facility] SERVICES [address] [place], [place], [address] Phone: [phone]   Fax:  [phone]  Occupational Therapy Treatment  Patient Details  Name: Vincent Mason MRN: [id number] Date of Birth: Apr 16, 1936 No Data Recorded  Encounter Date: 12/11/2015      OT End of Session - 12/11/15 1511    Visit Number 2   Number of Visits 36   Date for OT Re-Evaluation 02/26/16   OT Start Time 0210   OT Stop Time 0255   OT Time Calculation (min) 45 min   Equipment Utilized During Treatment Lymphedema Management Self-Care Workbook   Activity Tolerance Treatment limited secondary to medical complications (Comment);Patient tolerated treatment well;No increased pain   Behavior During Therapy [facility] for tasks assessed/performed      Past Medical History  Diagnosis Date  . Hypertension   . Hyperlipidemia   . COPD (chronic obstructive pulmonary disease) ([place])   . Kidney disease, chronic, stage III (moderate, EGFR 30-59 ml/min)     ARF 2013  . Atrial fibrillation and flutter [facility]) 2013    Dr Nehemiah Massed at [facility]  on [address].   . OSA (obstructive sleep apnea)     non-compliant with CPAP.     Past Surgical History  Procedure Laterality Date  . Hyperplastic colon polyp  02/2002    Sigmoid polyps  . Coronary artery bypass graft  10/1997  . History of cervical discectomy  2009    C5-C6    There were no vitals filed for this visit.      Subjective Assessment - 12/11/15 1509    Subjective  Pt presents for OT visit 2 for CDT for BLE lymphedema. Pt is accompanied by his wife. Pt states he is removing compression wraps immediately after session because he has an appointment in [place].   Patient is accompained by: Family member   Pertinent History LE PRECAUTIONS/ CDT CONTRAINDICATIONS: uncontrolled CHF, COPD, AFib, CAD, HCC, Valve incompetence, CKD (stage III),    Limitations leg swelling, leg pain,difficulty walking, decreased endurance  and activity tolerance (SOB during exertion), decreased functional mobiliity and transfers, needs assistance with all basic and instrumental ADLs,  difficulty fitting lowewr body clothing and street clothes, decreased alertness, decreased swallowing ability ( by spouse report),    Patient Stated Goals Decrease swelling and keep it from getting worse   Currently in Pain? --  not rated numerically   Pain Onset More than a month ago                      OT Treatments/Exercises (OP) - 12/11/15 0001    ADLs   ADL Education Given Yes   Manual Therapy   Manual Therapy Edema management;Compression Bandaging                     OT Long Term Goals - 11/28/15 1501    OT LONG TERM GOAL #1   Title Patient to demonstrate understanding of lymphedema etiology, progression, treatment  plan, and LE precautions to limit infection risk and progression.   Baseline dependent   Time 2   Period Weeks   Status New   OT LONG TERM GOAL #2   Title Lymphedema (LE) self-care: Pt able to apply multi layered, gradient compression wraps independently using proper techniques with Max caregiver assistance to achieve optimal limb volume reduction and to limit LE progression.   Baseline dependent   Time 12   Period Weeks  Status New   OT LONG TERM GOAL #3   Title Lymphedema (LE) self-care:  Pt to achieve at least 10% LLE limb volume reductions during Intensive CDT to limit LE progression, decrease infection risk, to reduce pain/ discomfort, and to improve safe ambulation and functional mobility.   Baseline dependent   Time 12   Period Weeks   Status New   OT LONG TERM GOAL #4   Title Lymphedema (LE) self-care:  Pt >/= 85 % compliant with all daily, LE self-care protocols for home program with Max caregiver assistance, including simple self-manual lymphatic drainage (MLD), skin care, lymphatic pumping the ex, skin care, and donning/ doffing compression wraps and garments o limit LE  progression and further functional decline.     Baseline dependent   Time 12   Period Weeks   Status New   OT LONG TERM GOAL #5   Title Lymphedema (LE) self-care:  Pt to tolerate daily compression wraps, garments and devices in keeping w/ prescribed wear regime within 1 week of issue date to progress and retain clinical and functional gains and to limit LE progression.   Baseline dependent   Time 12   Period Weeks   Status New   Long Term Additional Goals   Additional Long Term Goals Yes   OT LONG TERM GOAL #6   Title Lymphedema (LE) self-care:  During Management Phase CDT Pt to sustain limb volume reductions achieved during Intensive Phase CDT within 5% utilizing LE self-care protocols, appropriate compression garments/ devices, and needed level of caregiver assistance.   Baseline dependent   Time 6   Period Months   Status New               Plan - 12/11/15 1512    Clinical Impression Statement Emphasis of OT Rx today on teach LE ADLs, including compression wrapping and lymphatic pumping ther ex. Pt's engagement in session was somewhat limited. He stated that he was bored during Pt education for ther ex and he had his wife remove compression wraps before leaving treatment room. Pt tolerated introductory 4 layer gradient wrap to below R knee  without difficulty during process. Spouse , who will assist w/ wrapping between sessions was attentive. By end of session Pt underwent teaching for ther ex, but declined to  return demonstration or teach back.   Rehab Potential Fair   OT Treatment/Interventions Self-care/ADL training;DME and/or AE instruction;Manual lymph drainage;Patient/family education;Compression bandaging;Therapeutic exercises;Therapeutic exercise;Therapeutic activities;Energy conservation;Manual Therapy      Patient will benefit from skilled therapeutic intervention in order to improve the following deficits and impairments:  Decreased knowledge of use of DME, Decreased  skin integrity, Increased edema, Cardiopulmonary status limiting activity, Decreased mobility, Other (comment), Decreased activity tolerance, Decreased endurance, Decreased strength, Decreased knowledge of precautions, Difficulty walking, Impaired flexibility, Pain, Impaired sensation, Decreased balance (decreased alerness and memory)  Visit Diagnosis: Lymphedema, not elsewhere classified    Problem List Patient Active Problem List   Diagnosis Date Noted  . Hypomagnesemia 09/14/2015  . Oral thrush 09/14/2015  . Acute respiratory failure with hypoxia ([place]) 09/12/2015  . HCAP (healthcare-associated pneumonia) 09/12/2015  . C. difficile colitis 09/12/2015  . Acute on chronic diastolic CHF (congestive heart failure), NYHA class 1 ([place]) 09/12/2015  . Severe sepsis with septic shock ([place]) 09/12/2015  . Chronic atrial fibrillation ([place]) 09/12/2015  . Essential hypertension 09/12/2015  . ATN (acute tubular necrosis) ([place]) 09/12/2015  . Fecal occult blood test positive 09/12/2015  . Hepatic cirrhosis ([place])   .  Abdominal pain   . Status post peripherally inserted central catheter (PICC) central line placement   . Acute on chronic renal failure ([place]) 09/03/2015  . Normocytic anemia 09/03/2015  . Opioid dependence ([place]) 09/03/2015  . Pulmonary arterial hypertension ([place]) 09/03/2015  . Acute encephalopathy 09/03/2015  . Open leg wound 09/02/2015  . Lymphedema 08/26/2015  . Hypokalemia 08/26/2015  . Hyperuricemia 07/09/2015  . Coronary artery disease 01/22/2015  . Chronic kidney disease (CKD), stage III (moderate) 01/22/2015  . Chronic constipation 01/22/2015  . COPD (chronic obstructive pulmonary disease) ([place]) 01/22/2015  . ED (erectile dysfunction) of organic origin 01/22/2015  . Lymphedema of both lower extremities 01/22/2015  . Fatigue 01/22/2015  . Blood in the urine 01/22/2015  . Calculus of kidney 01/22/2015  . Memory loss 01/22/2015  . Hypertensive pulmonary vascular disease  ([place]) 01/22/2015  . Displacement of cervical intervertebral disc without myelopathy 01/22/2015  . Seizures ([place]) 01/17/2015  . Obstructive sleep apnea 01/17/2015  . Hyperlipidemia 01/17/2015  . Leg swelling 01/17/2015  . Degenerative disc disease, lumbar 01/17/2015  . Back pain 11/12/2014  . TI (tricuspid incompetence) 08/02/2014    Andrey Spearman, MS, OTR/L, [facility] 12/11/2015 3:17 PM  [place] MAIN [facility] SERVICES [address] [place], [place], [address] Phone: [phone]   Fax:  [phone]  Name: TAJH LIVSEY MRN: [id number] Date of Birth: Nov 26, 1935

## 2015-12-11 NOTE — Patient Instructions (Signed)

## 2015-12-12 ENCOUNTER — Ambulatory Visit (INDEPENDENT_AMBULATORY_CARE_PROVIDER_SITE_OTHER): Payer: Medicare Other | Admitting: Gastroenterology

## 2015-12-12 ENCOUNTER — Other Ambulatory Visit: Payer: Self-pay

## 2015-12-12 ENCOUNTER — Encounter: Payer: Self-pay | Admitting: Gastroenterology

## 2015-12-12 VITALS — BP 139/67 | HR 77 | Temp 97.9°F | Ht 71.0 in | Wt 185.0 lb

## 2015-12-12 DIAGNOSIS — K7689 Other specified diseases of liver: Secondary | ICD-10-CM

## 2015-12-12 DIAGNOSIS — R935 Abnormal findings on diagnostic imaging of other abdominal regions, including retroperitoneum: Secondary | ICD-10-CM

## 2015-12-12 NOTE — Progress Notes (Signed)
Gastroenterology Consultation  Referring Provider:     Birdie Sons, MD Primary Care Physician:  Lelon Huh, MD Primary Gastroenterologist:  Dr. Allen Norris     Reason for Consultation:     Nodular liver        HPI:   Vincent Mason is a 80 y.o. y/o male referred for consultation & management of Nodular liver by Dr. Lelon Huh, MD.  This patient comes today after being found to have a nodular liver on an ultrasound. The patient had a ultrasound just prior to that that did not report any abnormalities in the liver. The patient was in the hospital with multiple medical problems and at that time had elevated liver enzymes but prior to admission the liver enzymes were normal and have returned back to normal since being discharged. At of all of his imaging and workup the only thing to suggest liver disease was the ultrasound that was actually quoted to say possible cirrhosis. The patient denies any history of alcohol abuse. He also denies ever being told he had anything wrong with his liver. The patient states he is slowly getting back to normal since being discharged from the hospital. There is no report of any black stools or bloody stools. He also denies any abdominal pain nausea or vomiting.  Past Medical History  Diagnosis Date  . Hypertension   . Hyperlipidemia   . COPD (chronic obstructive pulmonary disease) ([place])   . Kidney disease, chronic, stage III (moderate, EGFR 30-59 ml/min)     ARF 2013  . Atrial fibrillation and flutter [facility]) 2013    Dr Nehemiah Massed at [facility]  on [address].   . OSA (obstructive sleep apnea)     non-compliant with CPAP.     Past Surgical History  Procedure Laterality Date  . Hyperplastic colon polyp  02/2002    Sigmoid polyps  . Coronary artery bypass graft  10/1997  . History of cervical discectomy  2009    C5-C6    Prior to Admission medications   Medication Sig Start Date End Date Taking? Authorizing Provider  colchicine 0.6 MG tablet Take 1 tablet (0.6  mg total) by mouth daily as needed (for gout). 08/23/15  Yes Birdie Sons, MD  [place] 125 MCG tablet  12/06/15  Yes Historical Provider, MD  metolazone (ZAROXOLYN) 5 MG tablet Take 1-2 tablets (5-10 mg total) by mouth daily as needed (swelling). 11/25/15  Yes Birdie Sons, MD  metoprolol succinate (TOPROL-XL) 100 MG 24 hr tablet Take 100 mg by mouth daily. Take with or immediately following a meal.   Yes Historical Provider, MD  OXcarbazepine (TRILEPTAL) 150 MG tablet Take 1 tablet (150 mg total) by mouth 2 (two) times daily. Prescribed by Dr. Melrose Nakayama 08/27/15  Yes Birdie Sons, MD  oxyCODONE (ROXICODONE) 15 MG immediate release tablet Take 1 tablet (15 mg total) by mouth every 6 (six) hours as needed for pain. 11/12/15  Yes Birdie Sons, MD  potassium chloride (K-DUR) 10 MEQ tablet  11/15/15  Yes Historical Provider, MD  rOPINIRole (REQUIP) 4 MG tablet TAKE ONE TABLET BY MOUTH EVERY NIGHT AT BEDTIME FOR RESTLESS LEG SYNDROME 07/15/15  Yes Birdie Sons, MD  rosuvastatin (CRESTOR) 40 MG tablet TAKE ONE (1) TABLET EACH DAY *GENERIC CRESTOR 04/30/15  Yes Birdie Sons, MD  sildenafil (REVATIO) 20 MG tablet Take 20 mg by mouth 3 (three) times daily.   Yes Historical Provider, MD  torsemide (DEMADEX) 20 MG tablet 2 tablets  once or twice a day for swelling Patient taking differently: Take 40 mg by mouth 2 (two) times daily.  08/26/15  Yes Birdie Sons, MD  XARELTO 15 MG TABS tablet  11/18/15  Yes Historical Provider, MD  allopurinol (ZYLOPRIM) 100 MG tablet Reported on 12/12/2015 12/03/15   Historical Provider, MD  benazepril (LOTENSIN) 20 MG tablet Take 1 tablet by mouth daily. Reported on 12/12/2015 02/12/14   Historical Provider, MD  Digoxin 62.5 MCG TABS Take 0.0625 mg by mouth daily. Patient not taking: Reported on 12/12/2015 09/17/15   Venetia Maxon Rama, MD  famotidine (PEPCID) 20 MG tablet Take 1 tablet (20 mg total) by mouth daily. Patient not taking: Reported on 12/12/2015 09/17/15   Venetia Maxon  Rama, MD  Febuxostat (ULORIC) 80 MG TABS Take 1 tablet (80 mg total) by mouth daily. Patient not taking: Reported on 12/12/2015 08/20/15   Birdie Sons, MD  gabapentin (NEURONTIN) 100 MG capsule Take 1 capsule (100 mg total) by mouth 3 (three) times daily. Patient not taking: Reported on 12/12/2015 09/17/15   Venetia Maxon Rama, MD  hydrocortisone-pramoxine [facility]) rectal foam Place 1 applicator rectally 2 (two) times daily. Patient not taking: Reported on 11/21/2015 09/17/15   Venetia Maxon Rama, MD  ipratropium-albuterol (DUONEB) 0.5-2.5 (3) MG/3ML SOLN Take 3 mLs by nebulization every 6 (six) hours as needed. Patient not taking: Reported on 12/12/2015 09/17/15   Venetia Maxon Rama, MD  lidocaine (XYLOCAINE) 2 % solution Use as directed 15 mLs in the mouth or throat every 3 (three) hours as needed for mouth pain. Patient not taking: Reported on 12/12/2015 09/17/15   Venetia Maxon Rama, MD  Maltodextrin-Xanthan Gum ([place]) POWD Nectar thicken consistency Patient not taking: Reported on 12/12/2015 09/17/15   Venetia Maxon Rama, MD  potassium chloride SA (K-DUR,KLOR-CON) 20 MEQ tablet Take 2 tablets (40 mEq total) by mouth 2 (two) times daily. Patient not taking: Reported on 12/12/2015 07/10/15   Birdie Sons, MD  ranitidine (ZANTAC) 150 MG tablet Take 1 tablet by mouth 2 (two) times daily as needed. Reported on 12/12/2015 08/11/13   Historical Provider, MD  zolpidem (AMBIEN) 5 MG tablet Take 1 tablet (5 mg total) by mouth at bedtime. Reported on 07/08/2015 Patient not taking: Reported on 12/12/2015 09/17/15   Venetia Maxon Rama, MD    History reviewed. No pertinent family history.   Social History  Substance Use Topics  . Smoking status: Former Research scientist (life sciences)  . Smokeless tobacco: Never Used     Comment: Quit in 1990  . Alcohol Use: No    Allergies as of 12/12/2015 - Review Complete 12/12/2015  Allergen Reaction Noted  . Decongestant  [oxymetazoline]  01/22/2015    Review of Systems:      All systems reviewed and negative except where noted in HPI.   Physical Exam:  BP 139/67 mmHg  Pulse 77  Temp(Src) 97.9 F (36.6 C) (Oral)  Ht 5' 11"  (1.803 m)  Wt 185 lb (83.915 kg)  BMI 25.81 kg/m2 No LMP for male patient. Psych:  Alert and cooperative. Normal mood and affect. General:   Alert,  Well-developed, well-nourished, pleasant and cooperative in NAD Head:  Normocephalic and atraumatic. Eyes:  Sclera clear, no icterus.   Conjunctiva pink. Ears:  Normal auditory acuity. Nose:  No deformity, discharge, or lesions. Mouth:  No deformity or lesions,oropharynx pink & moist. Neck:  Supple; no masses or thyromegaly. Lungs:  Respirations even and unlabored.  Clear throughout to auscultation.   No wheezes,  crackles, or rhonchi. No acute distress. Heart:  Regular rate and rhythm; no murmurs, clicks, rubs, or gallops. Abdomen:  Normal bowel sounds.  No bruits.  Soft, non-tender and non-distended without masses, hepatosplenomegaly or hernias noted.  No guarding or rebound tenderness.  Negative Carnett sign.   Rectal:  Deferred.  Msk:  Symmetrical without gross deformities.  Good, equal movement & strength bilaterally. Pulses:  Normal pulses noted. Extremities:  No clubbing or edema.  No cyanosis. Neurologic:  Alert and oriented x3;  grossly normal neurologically. Skin:  Intact without significant lesions or rashes.  No jaundice. Lymph Nodes:  No significant cervical adenopathy. Psych:  Alert and cooperative. Normal mood and affect.  Imaging Studies: US Abdomen Limited Ruq  12-02-2015  CLINICAL DATA:  Cirrhosis. EXAM: US ABDOMEN LIMITED - RIGHT UPPER QUADRANT COMPARISON:  CT 09/10/2015. FINDINGS: Gallbladder: No gallstones or wall thickening visualized. No sonographic Murphy sign noted by sonographer. Common bile duct: Diameter: 4.4 mm Liver: Liver has a slightly nodular contour. Cirrhosis cannot be excluded. Portal vein is patent. No focal hepatic lesion identified . IMPRESSION: Liver  has a slightly nodular contour. Cirrhosis cannot be excluded. Exam otherwise unremarkable. Electronically Signed   By: Marcello Moores  Register   On: 12/02/15 09:35    Assessment and Plan:   Vincent Mason is a 80 y.o. y/o male who comes in with a isolated finding of a nodular liver. The patient's platelets are over 400 which is not consistent with portal hypertension. The patient's albumin has been low but the patient was in the hospital with multiple medical problems. The patient's liver enzymes have come back to normal. The patient did have a CT scan that did not show any sign of cirrhosis but a ultrasound that showed a possible nodular liver. The patient will have a fibrosis scan done. I have explained to the patient that I doubt that he has cirrhosis but will send of the scan to make sure. The patient has been explained the plan and agrees with it.   Note: This dictation was prepared with Dragon dictation along with smaller phrase technology. Any transcriptional errors that result from this process are unintentional.

## 2015-12-12 NOTE — Patient Instructions (Signed)
Pt scheduled for [place] tissue elastography at [facility] on Monday, July 24th at 9:30am. You are to arrive at [place] are to have nothing to eat or drink after midnight Sunday night.

## 2015-12-13 ENCOUNTER — Ambulatory Visit: Payer: Medicare Other | Admitting: Occupational Therapy

## 2015-12-13 DIAGNOSIS — I890 Lymphedema, not elsewhere classified: Secondary | ICD-10-CM

## 2015-12-13 NOTE — Therapy (Signed)
[facility] MAIN REHAB SERVICES [address] [place], [place], [address] Phone: [phone]   Fax:  [phone]  Occupational Therapy Treatment  Patient Details  Name: Vincent Mason MRN: [id number] Date of Birth: 12/16/1935 No Data Recorded  Encounter Date: 12/13/2015      OT End of Session - 12/13/15 1523    Visit Number 3   Number of Visits 36   Date for OT Re-Evaluation 02/26/16   OT Start Time 0205   OT Stop Time 0310   OT Time Calculation (min) 65 min   Equipment Utilized During Treatment Lymphedema Management Self-Care Workbook   Activity Tolerance Treatment limited secondary to medical complications (Comment);Patient tolerated treatment well;No increased pain   Behavior During Therapy WFL for tasks assessed/performed      Past Medical History  Diagnosis Date  . Hypertension   . Hyperlipidemia   . COPD (chronic obstructive pulmonary disease) (HCC)   . Kidney disease, chronic, stage III (moderate, EGFR 30-59 ml/min)     ARF 2013  . Atrial fibrillation and flutter (HCC) 2013    Dr Kowalski at [facility]  on Xarelto.   . OSA (obstructive sleep apnea)     non-compliant with CPAP.     Past Surgical History  Procedure Laterality Date  . Hyperplastic colon polyp  02/2002    Sigmoid polyps  . Coronary artery bypass graft  10/1997  . History of cervical discectomy  2009    C5-C6    There were no vitals filed for this visit.      Subjective Assessment - 12/13/15 1512    Subjective  Pt presents for OT visit 3 for CDT for BLE lymphedema. Pt is accompanied by his wife. Emphasis of visit today is on volumetric measurements to establish curent limb volumes and on Pt and care giver edu for LE self care ( compression wraps).   Patient is accompained by: Family member   Pertinent History LE PRECAUTIONS/ CDT CONTRAINDICATIONS: uncontrolled CHF, COPD, AFib, CAD, HCC, Valve incompetence, CKD (stage III),    Limitations leg swelling, leg  pain,difficulty walking, decreased endurance and activity tolerance (SOB during exertion), decreased functional mobiliity and transfers, needs assistance with all basic and instrumental ADLs,  difficulty fitting lowewr body clothing and street clothes, decreased alertness, decreased swallowing ability ( by spouse report),    Patient Stated Goals Decrease swelling and keep it from getting worse   Currently in Pain? Yes  B legs- no numerical rating   Pain Onset More than a month ago             LYMPHEDEMA/ONCOLOGY QUESTIONNAIRE - 12/13/15 1515    Right Lower Extremity Lymphedema   Other RLE A-D ( ankle to below knee) volume =3888.52 ml   Other RLE A-G (ankle to groin )= 9019.45 ml   Other A-D limb volume differential = 14.59%, R>L, and A-G LVD = 7.36%, R>L.   Left Lower Extremity Lymphedema   Other LLE A-D ( ankle to below knee) volume =3321.03 ml   Other LLE A-G (ankle to groin )= 8355.67 ml                 OT Treatments/Exercises (OP) - 12/13/15 0001    ADLs   ADL Education Given Yes   Manual Therapy   Manual Therapy Edema management;Compression Bandaging   Edema Management Comparative BLE limb volumetrics   Compression Bandaging LLE gradient compression wraps applied from base of toes to below knee as follows:                  OT Education - 12/13/15 1522    Education Details Emphasis of LE ADL training today on patient and caregiver edu for proper compression wraps application using  circumferential, gradient techniques and proper positioning.    Person(s) Educated Patient;Spouse   Methods Explanation;Demonstration;Tactile cues;Verbal cues;Handout   Comprehension Verbalized understanding;Returned demonstration;Verbal cues required;Tactile cues required;Need further instruction             OT Long Term Goals - 11/28/15 1501    OT LONG TERM GOAL #1   Title Patient to demonstrate understanding of lymphedema etiology, progression, treatment  plan, and LE  precautions to limit infection risk and progression.   Baseline dependent   Time 2   Period Weeks   Status New   OT LONG TERM GOAL #2   Title Lymphedema (LE) self-care: Pt able to apply multi layered, gradient compression wraps independently using proper techniques with Max caregiver assistance to achieve optimal limb volume reduction and to limit LE progression.   Baseline dependent   Time 12   Period Weeks   Status New   OT LONG TERM GOAL #3   Title Lymphedema (LE) self-care:  Pt to achieve at least 10% LLE limb volume reductions during Intensive CDT to limit LE progression, decrease infection risk, to reduce pain/ discomfort, and to improve safe ambulation and functional mobility.   Baseline dependent   Time 12   Period Weeks   Status New   OT LONG TERM GOAL #4   Title Lymphedema (LE) self-care:  Pt >/= 85 % compliant with all daily, LE self-care protocols for home program with Max caregiver assistance, including simple self-manual lymphatic drainage (MLD), skin care, lymphatic pumping the ex, skin care, and donning/ doffing compression wraps and garments o limit LE progression and further functional decline.     Baseline dependent   Time 12   Period Weeks   Status New   OT LONG TERM GOAL #5   Title Lymphedema (LE) self-care:  Pt to tolerate daily compression wraps, garments and devices in keeping w/ prescribed wear regime within 1 week of issue date to progress and retain clinical and functional gains and to limit LE progression.   Baseline dependent   Time 12   Period Weeks   Status New   Long Term Additional Goals   Additional Long Term Goals Yes   OT LONG TERM GOAL #6   Title Lymphedema (LE) self-care:  During Management Phase CDT Pt to sustain limb volume reductions achieved during Intensive Phase CDT within 5% utilizing LE self-care protocols, appropriate compression garments/ devices, and needed level of caregiver assistance.   Baseline dependent   Time 6   Period Months    Status New               Plan - 12/13/15 1524    Clinical Impression Statement Comparative BLE limb volumetrics reveals limb volume differentials measure RLE greater in volume than the left both below the knee and full leg-wise. Below the knee RLE is 14. 59% larger, and ankle to groin RLE measures 7.36% larger. Typical limb volume differential is 5% or less in my experience. Pt and caregiver were able to apply gradient knee length compression wraps ( 4 layers) with max assistance after skilled education today. We'll practice after all sessions next week and Pt agrees wit work on increasing tolerance.   Rehab Potential Fair   OT Treatment/Interventions Self-care/ADL training;DME and/or AE instruction;Manual lymph drainage;Patient/family education;Compression bandaging;Therapeutic exercises;Therapeutic exercise;Therapeutic activities;Energy conservation;Manual Therapy        Patient will benefit from skilled therapeutic intervention in order to improve the following deficits and impairments:  Decreased knowledge of use of DME, Decreased skin integrity, Increased edema, Cardiopulmonary status limiting activity, Decreased mobility, Other (comment), Decreased activity tolerance, Decreased endurance, Decreased strength, Decreased knowledge of precautions, Difficulty walking, Impaired flexibility, Pain, Impaired sensation, Decreased balance (decreased alerness and memory)  Visit Diagnosis: Lymphedema, not elsewhere classified    Problem List Patient Active Problem List   Diagnosis Date Noted  . Hypomagnesemia 09/14/2015  . Oral thrush 09/14/2015  . Acute respiratory failure with hypoxia ([place]) 09/12/2015  . HCAP (healthcare-associated pneumonia) 09/12/2015  . C. difficile colitis 09/12/2015  . Acute on chronic diastolic CHF (congestive heart failure), NYHA class 1 ([place]) 09/12/2015  . Severe sepsis with septic shock ([place]) 09/12/2015  . Chronic atrial fibrillation ([place]) 09/12/2015  .  Essential hypertension 09/12/2015  . ATN (acute tubular necrosis) ([place]) 09/12/2015  . Fecal occult blood test positive 09/12/2015  . Hepatic cirrhosis ([place])   . Abdominal pain   . Status post peripherally inserted central catheter (PICC) central line placement   . Acute on chronic renal failure ([place]) 09/03/2015  . Normocytic anemia 09/03/2015  . Opioid dependence ([place]) 09/03/2015  . Pulmonary arterial hypertension ([place]) 09/03/2015  . Acute encephalopathy 09/03/2015  . Open leg wound 09/02/2015  . Lymphedema 08/26/2015  . Hypokalemia 08/26/2015  . Hyperuricemia 07/09/2015  . Coronary artery disease 01/22/2015  . Chronic kidney disease (CKD), stage III (moderate) 01/22/2015  . Chronic constipation 01/22/2015  . COPD (chronic obstructive pulmonary disease) ([place]) 01/22/2015  . ED (erectile dysfunction) of organic origin 01/22/2015  . Lymphedema of both lower extremities 01/22/2015  . Fatigue 01/22/2015  . Blood in the urine 01/22/2015  . Calculus of kidney 01/22/2015  . Memory loss 01/22/2015  . Hypertensive pulmonary vascular disease ([place]) 01/22/2015  . Displacement of cervical intervertebral disc without myelopathy 01/22/2015  . Seizures ([place]) 01/17/2015  . Obstructive sleep apnea 01/17/2015  . Hyperlipidemia 01/17/2015  . Leg swelling 01/17/2015  . Degenerative disc disease, lumbar 01/17/2015  . Back pain 11/12/2014  . TI (tricuspid incompetence) 08/02/2014    Andrey Spearman, MS, OTR/L, [facility] 12/13/2015 3:28 PM  [place] MAIN [facility] SERVICES [address] [place], [place], [address] Phone: [phone]   Fax:  [phone]  Name: Vincent Mason MRN: [id number] Date of Birth: 05-Dec-1935

## 2015-12-13 NOTE — Patient Instructions (Signed)
Compression wraps:  It takes some people time to build tolerance to compression. Perform lymphatic pumping there ex lymphatic if wraps begin to feel tight. If you must remove wraps, try removing 1 at a time, rather than all 4 layers at once. Try to increase wear time each day.  See LE instructions and precautions as established- see initial eval.

## 2015-12-14 ENCOUNTER — Other Ambulatory Visit: Payer: Self-pay | Admitting: Gastroenterology

## 2015-12-14 DIAGNOSIS — K7689 Other specified diseases of liver: Secondary | ICD-10-CM

## 2015-12-16 ENCOUNTER — Ambulatory Visit: Payer: Medicare Other | Admitting: Occupational Therapy

## 2015-12-16 ENCOUNTER — Ambulatory Visit
Admission: RE | Admit: 2015-12-16 | Discharge: 2015-12-16 | Disposition: A | Discharge status: Home / Self Care | Payer: Medicare Other | Source: Ambulatory Visit | Attending: Gastroenterology | Admitting: Gastroenterology

## 2015-12-16 DIAGNOSIS — K7689 Other specified diseases of liver: Secondary | ICD-10-CM | POA: Diagnosis not present

## 2015-12-16 DIAGNOSIS — K7460 Unspecified cirrhosis of liver: Secondary | ICD-10-CM | POA: Diagnosis not present

## 2015-12-18 ENCOUNTER — Ambulatory Visit: Payer: Medicare Other | Admitting: Occupational Therapy

## 2015-12-18 DIAGNOSIS — I890 Lymphedema, not elsewhere classified: Secondary | ICD-10-CM

## 2015-12-18 NOTE — Therapy (Addendum)
[place] MAIN [facility] SERVICES [address] [place], [place], [address] Phone: [phone]   Fax:  [phone]  Occupational Therapy Treatment  Patient Details  Name: Vincent Mason MRN: [id number] Date of Birth: [birth year] No Data Recorded  Encounter Date: 12/18/2015      OT End of Session - 12/18/15 1455    Visit Number 4   Number of Visits 36   Date for OT Re-Evaluation 02/26/16   OT Start Time 0201   OT Stop Time 0248   OT Time Calculation (min) 47 min   Equipment Utilized During Treatment Lymphedema Management Self-Care Workbook   Activity Tolerance Treatment limited secondary to medical complications (Comment);Patient tolerated treatment well;No increased pain   Behavior During Therapy [facility] for tasks assessed/performed;Restless      Past Medical History:  Diagnosis Date  . Atrial fibrillation and flutter [facility]) 2013   Dr Nehemiah Massed at [facility]  on [place].   Marland Kitchen COPD (chronic obstructive pulmonary disease) ([place])   . Hyperlipidemia   . Hypertension   . Kidney disease, chronic, stage III (moderate, EGFR 30-59 ml/min)    ARF 2013  . OSA (obstructive sleep apnea)    non-compliant with CPAP.     Past Surgical History:  Procedure Laterality Date  . CORONARY ARTERY BYPASS GRAFT  10/1997  . history of cervical discectomy  2009   C5-C6  . Hyperplastic colon polyp  02/2002   Sigmoid polyps    There were no vitals filed for this visit.      Subjective Assessment - 12/18/15 1453    Subjective  Pt presents for OT visit 4 for CDT for BLE lymphedema. Pt is accompanied by his wife. and grandson. Pt reports he was able to tolerate compression wraps for 8 hours only after inital wrap application.   Patient is accompained by: Family member   Pertinent History LE PRECAUTIONS/ CDT CONTRAINDICATIONS: uncontrolled CHF, COPD, AFib, CAD, HCC, Valve incompetence, CKD (stage III),    Limitations leg swelling, leg pain,difficulty walking, decreased  endurance and activity tolerance (SOB during exertion), decreased functional mobiliity and transfers, needs assistance with all basic and instrumental ADLs,  difficulty fitting lowewr body clothing and street clothes, decreased alertness, decreased swallowing ability ( by spouse report),    Patient Stated Goals Decrease swelling and keep it from getting worse   Pain Onset More than a month ago                              OT Education - 12/18/15 1454    Education provided Yes   Education Details Cont Pt and CG edu for gradient compression wrap application.    Person(s) Educated Patient;Spouse   Methods Explanation;Demonstration;Tactile cues;Verbal cues;Handout   Comprehension Verbalized understanding;Returned demonstration;Verbal cues required;Tactile cues required;Need further instruction             OT Long Term Goals - 11/28/15 1501      OT LONG TERM GOAL #1   Title Patient to demonstrate understanding of lymphedema etiology, progression, treatment  plan, and LE precautions to limit infection risk and progression.   Baseline dependent   Time 2   Period Weeks   Status New     OT LONG TERM GOAL #2   Title Lymphedema (LE) self-care: Pt able to apply multi layered, gradient compression wraps independently using proper techniques with Max caregiver assistance to achieve optimal limb volume reduction and to limit LE progression.  Baseline dependent   Time 12   Period Weeks   Status New     OT LONG TERM GOAL #3   Title Lymphedema (LE) self-care:  Pt to achieve at least 10% LLE limb volume reductions during Intensive CDT to limit LE progression, decrease infection risk, to reduce pain/ discomfort, and to improve safe ambulation and functional mobility.   Baseline dependent   Time 12   Period Weeks   Status New     OT LONG TERM GOAL #4   Title Lymphedema (LE) self-care:  Pt >/= 85 % compliant with all daily, LE self-care protocols for home program with Max  caregiver assistance, including simple self-manual lymphatic drainage (MLD), skin care, lymphatic pumping the ex, skin care, and donning/ doffing compression wraps and garments o limit LE progression and further functional decline.     Baseline dependent   Time 12   Period Weeks   Status New     OT LONG TERM GOAL #5   Title Lymphedema (LE) self-care:  Pt to tolerate daily compression wraps, garments and devices in keeping w/ prescribed wear regime within 1 week of issue date to progress and retain clinical and functional gains and to limit LE progression.   Baseline dependent   Time 12   Period Weeks   Status New     Long Term Additional Goals   Additional Long Term Goals Yes     OT LONG TERM GOAL #6   Title Lymphedema (LE) self-care:  During Management Phase CDT Pt to sustain limb volume reductions achieved during Intensive Phase CDT within 5% utilizing LE self-care protocols, appropriate compression garments/ devices, and needed level of caregiver assistance.   Baseline dependent   Time 6   Period Months   Status New               Plan - 12/18/15 1456    Clinical Impression Statement Pt working on building tolerance to LLE  knee length compression wraps. Pt removed 3 layer wraps due to pain, which he was unable to describe today. Pt tolerated MLD well today despite tenderness throughout entire lleg. Spouse needed Max A to apply short stretch wraps after skilled instruction.   Rehab Potential Fair   OT Treatment/Interventions Self-care/ADL training;DME and/or AE instruction;Manual lymph drainage;Patient/family education;Compression bandaging;Therapeutic exercises;Therapeutic exercise;Therapeutic activities;Energy conservation;Manual Therapy      Patient will benefit from skilled therapeutic intervention in order to improve the following deficits and impairments:  Decreased knowledge of use of DME, Decreased skin integrity, Increased edema, Cardiopulmonary status limiting  activity, Decreased mobility, Other (comment), Decreased activity tolerance, Decreased endurance, Decreased strength, Decreased knowledge of precautions, Difficulty walking, Impaired flexibility, Pain, Impaired sensation, Decreased balance (decreased alerness and memory)  Visit Diagnosis: Lymphedema, not elsewhere classified    Problem List Patient Active Problem List   Diagnosis Date Noted  . Hypomagnesemia 09/14/2015  . Oral thrush 09/14/2015  . Acute respiratory failure with hypoxia ([place]) 09/12/2015  . HCAP (healthcare-associated pneumonia) 09/12/2015  . C. difficile colitis 09/12/2015  . Acute on chronic diastolic CHF (congestive heart failure), NYHA class 1 ([place]) 09/12/2015  . Severe sepsis with septic shock ([place]) 09/12/2015  . Chronic atrial fibrillation ([place]) 09/12/2015  . Essential hypertension 09/12/2015  . ATN (acute tubular necrosis) ([place]) 09/12/2015  . Fecal occult blood test positive 09/12/2015  . Hepatic cirrhosis ([place])   . Abdominal pain   . Status post peripherally inserted central catheter (PICC) central line placement   . Acute on chronic renal failure (  [place]) 09/03/2015  . Normocytic anemia 09/03/2015  . Opioid dependence ([place]) 09/03/2015  . Pulmonary arterial hypertension ([place]) 09/03/2015  . Acute encephalopathy 09/03/2015  . Open leg wound 09/02/2015  . Lymphedema 08/26/2015  . Hypokalemia 08/26/2015  . Hyperuricemia 07/09/2015  . Coronary artery disease 01/22/2015  . Chronic kidney disease (CKD), stage III (moderate) 01/22/2015  . Chronic constipation 01/22/2015  . COPD (chronic obstructive pulmonary disease) ([place]) 01/22/2015  . ED (erectile dysfunction) of organic origin 01/22/2015  . Lymphedema of both lower extremities 01/22/2015  . Fatigue 01/22/2015  . Blood in the urine 01/22/2015  . Calculus of kidney 01/22/2015  . Memory loss 01/22/2015  . Hypertensive pulmonary vascular disease ([place]) 01/22/2015  . Displacement of cervical intervertebral disc  without myelopathy 01/22/2015  . Seizures ([place]) 01/17/2015  . Obstructive sleep apnea 01/17/2015  . Hyperlipidemia 01/17/2015  . Leg swelling 01/17/2015  . Degenerative disc disease, lumbar 01/17/2015  . Back pain 11/12/2014  . TI (tricuspid incompetence) 08/02/2014    Andrey Spearman, MS, OTR/L, [facility] 12/18/15 3:03 PM  [place] MAIN [facility] SERVICES [address] [place], [place], [address] Phone: [phone]   Fax:  [phone]  Name: Vincent Mason MRN: [id number] Date of Birth: 11-17-1935

## 2015-12-20 ENCOUNTER — Telehealth: Payer: Self-pay

## 2015-12-20 ENCOUNTER — Ambulatory Visit: Payer: Medicare Other | Admitting: Occupational Therapy

## 2015-12-20 DIAGNOSIS — I890 Lymphedema, not elsewhere classified: Secondary | ICD-10-CM | POA: Diagnosis not present

## 2015-12-20 NOTE — Therapy (Signed)
[place] MAIN [facility] SERVICES [address] [place], [place], [address] Phone: [phone]   Fax:  [phone]  Occupational Therapy Treatment  Patient Details  Name: Vincent Mason MRN: [id number] Date of Birth: [birth year] No Data Recorded  Encounter Date: 12/20/2015      OT End of Session - 12/20/15 1536    Visit Number 5   Number of Visits 36   Date for OT Re-Evaluation 02/26/16   OT Start Time 0215   OT Stop Time 0314   OT Time Calculation (min) 59 min   Equipment Utilized During Treatment Lymphedema Management Self-Care Workbook   Activity Tolerance Treatment limited secondary to medical complications (Comment);Patient tolerated treatment well;No increased pain   Behavior During Therapy [facility] for tasks assessed/performed;Restless      Past Medical History:  Diagnosis Date  . Atrial fibrillation and flutter [facility]) 2013   Dr Nehemiah Massed at [facility]  on [place].   Marland Kitchen COPD (chronic obstructive pulmonary disease) ([place])   . Hyperlipidemia   . Hypertension   . Kidney disease, chronic, stage III (moderate, EGFR 30-59 ml/min)    ARF 2013  . OSA (obstructive sleep apnea)    non-compliant with CPAP.     Past Surgical History:  Procedure Laterality Date  . CORONARY ARTERY BYPASS GRAFT  10/1997  . history of cervical discectomy  2009   C5-C6  . Hyperplastic colon polyp  02/2002   Sigmoid polyps    There were no vitals filed for this visit.      Subjective Assessment - 12/20/15 1528    Subjective  Pt presents for OT visit 5 for CDT for BLE lymphedema. Pt is accompanied by his wife. and grandson. Pt reports he was able to tolerate compression wraps for about same 8 hours after  last session. I believe my wife needs more practice with those wraps."   Patient is accompained by: Family member   Pertinent History LE PRECAUTIONS/ CDT CONTRAINDICATIONS: uncontrolled CHF, COPD, AFib, CAD, HCC, Valve incompetence, CKD (stage III),    Limitations leg  swelling, leg pain,difficulty walking, decreased endurance and activity tolerance (SOB during exertion), decreased functional mobiliity and transfers, needs assistance with all basic and instrumental ADLs,  difficulty fitting lowewr body clothing and street clothes, decreased alertness, decreased swallowing ability ( by spouse report),    Patient Stated Goals Decrease swelling and keep it from getting worse   Pain Onset More than a month ago                      OT Treatments/Exercises (OP) - 12/20/15 0001      ADLs   ADL Education Given Yes     Manual Therapy   Manual Therapy Edema management;Compression Bandaging;Manual Lymphatic Drainage (MLD)   Manual Lymphatic Drainage (MLD) Functional LN   Compression Bandaging Ankle to below Knee (A-D)                OT Education - 12/20/15 1536    Education provided Yes   Education Details Reviewed progress towards goals. Cont edu for spouse for gradient compression wraps application.   Person(s) Educated Patient;Spouse   Methods Explanation;Demonstration;Tactile cues;Verbal cues   Comprehension Verbalized understanding             OT Long Term Goals - 11/28/15 1501      OT LONG TERM GOAL #1   Title Patient to demonstrate understanding of lymphedema etiology, progression, treatment  plan, and LE precautions to limit  infection risk and progression.   Baseline dependent   Time 2   Period Weeks   Status New     OT LONG TERM GOAL #2   Title Lymphedema (LE) self-care: Pt able to apply multi layered, gradient compression wraps independently using proper techniques with Max caregiver assistance to achieve optimal limb volume reduction and to limit LE progression.   Baseline dependent   Time 12   Period Weeks   Status New     OT LONG TERM GOAL #3   Title Lymphedema (LE) self-care:  Pt to achieve at least 10% LLE limb volume reductions during Intensive CDT to limit LE progression, decrease infection risk, to  reduce pain/ discomfort, and to improve safe ambulation and functional mobility.   Baseline dependent   Time 12   Period Weeks   Status New     OT LONG TERM GOAL #4   Title Lymphedema (LE) self-care:  Pt >/= 85 % compliant with all daily, LE self-care protocols for home program with Max caregiver assistance, including simple self-manual lymphatic drainage (MLD), skin care, lymphatic pumping the ex, skin care, and donning/ doffing compression wraps and garments o limit LE progression and further functional decline.     Baseline dependent   Time 12   Period Weeks   Status New     OT LONG TERM GOAL #5   Title Lymphedema (LE) self-care:  Pt to tolerate daily compression wraps, garments and devices in keeping w/ prescribed wear regime within 1 week of issue date to progress and retain clinical and functional gains and to limit LE progression.   Baseline dependent   Time 12   Period Weeks   Status New     Long Term Additional Goals   Additional Long Term Goals Yes     OT LONG TERM GOAL #6   Title Lymphedema (LE) self-care:  During Management Phase CDT Pt to sustain limb volume reductions achieved during Intensive Phase CDT within 5% utilizing LE self-care protocols, appropriate compression garments/ devices, and needed level of caregiver assistance.   Baseline dependent   Time 6   Period Months   Status New               Plan - 12/20/15 1537    Clinical Impression Statement LLE responding to CDT as ecidenced by volume reduction ( visual assessment) w/ increased skin wrinkles and  minimal softening of most superficial malleolar fibrosis on LLE. Pt slow to build tolerance to compression wraps. Spouse slow to Public affairs consultant. Cont as per POC at current frequency and duration..   Rehab Potential Fair   OT Treatment/Interventions Self-care/ADL training;DME and/or AE instruction;Manual lymph drainage;Patient/family education;Compression bandaging;Therapeutic  exercises;Therapeutic exercise;Therapeutic activities;Energy conservation;Manual Therapy      Patient will benefit from skilled therapeutic intervention in order to improve the following deficits and impairments:  Decreased knowledge of use of DME, Decreased skin integrity, Increased edema, Cardiopulmonary status limiting activity, Decreased mobility, Other (comment), Decreased activity tolerance, Decreased endurance, Decreased strength, Decreased knowledge of precautions, Difficulty walking, Impaired flexibility, Pain, Impaired sensation, Decreased balance (decreased alerness and memory)  Visit Diagnosis: Lymphedema, not elsewhere classified    Problem List Patient Active Problem List   Diagnosis Date Noted  . Hypomagnesemia 09/14/2015  . Oral thrush 09/14/2015  . Acute respiratory failure with hypoxia ([place]) 09/12/2015  . HCAP (healthcare-associated pneumonia) 09/12/2015  . C. difficile colitis 09/12/2015  . Acute on chronic diastolic CHF (congestive heart failure), NYHA class 1 ([place]) 09/12/2015  .  Severe sepsis with septic shock ([place]) 09/12/2015  . Chronic atrial fibrillation ([place]) 09/12/2015  . Essential hypertension 09/12/2015  . ATN (acute tubular necrosis) ([place]) 09/12/2015  . Fecal occult blood test positive 09/12/2015  . Hepatic cirrhosis ([place])   . Abdominal pain   . Status post peripherally inserted central catheter (PICC) central line placement   . Acute on chronic renal failure ([place]) 09/03/2015  . Normocytic anemia 09/03/2015  . Opioid dependence ([place]) 09/03/2015  . Pulmonary arterial hypertension ([place]) 09/03/2015  . Acute encephalopathy 09/03/2015  . Open leg wound 09/02/2015  . Lymphedema 08/26/2015  . Hypokalemia 08/26/2015  . Hyperuricemia 07/09/2015  . Coronary artery disease 01/22/2015  . Chronic kidney disease (CKD), stage III (moderate) 01/22/2015  . Chronic constipation 01/22/2015  . COPD (chronic obstructive pulmonary disease) ([place]) 01/22/2015  . ED  (erectile dysfunction) of organic origin 01/22/2015  . Lymphedema of both lower extremities 01/22/2015  . Fatigue 01/22/2015  . Blood in the urine 01/22/2015  . Calculus of kidney 01/22/2015  . Memory loss 01/22/2015  . Hypertensive pulmonary vascular disease ([place]) 01/22/2015  . Displacement of cervical intervertebral disc without myelopathy 01/22/2015  . Seizures ([place]) 01/17/2015  . Obstructive sleep apnea 01/17/2015  . Hyperlipidemia 01/17/2015  . Leg swelling 01/17/2015  . Degenerative disc disease, lumbar 01/17/2015  . Back pain 11/12/2014  . TI (tricuspid incompetence) 08/02/2014    Andrey Spearman, MS, OTR/L, [facility] 12/20/15 3:40 PM   [place] MAIN [facility] SERVICES [address] [place], [place], [address] Phone: [phone]   Fax:  [phone]  Name: Vincent Mason MRN: [id number] Date of Birth: [birth year]

## 2015-12-20 NOTE — Telephone Encounter (Signed)
Pt scheduled for a follow up appt with Wohl on 01/22/16 to discuss [place] results.

## 2015-12-20 NOTE — Telephone Encounter (Signed)
-----   Message from Lucilla Lame, MD sent at 12/18/2015  7:56 AM EDT ----- Please have the patient come in for a follow up.

## 2015-12-23 ENCOUNTER — Other Ambulatory Visit: Payer: Self-pay | Admitting: Family Medicine

## 2015-12-23 ENCOUNTER — Ambulatory Visit: Payer: Medicare Other | Admitting: Occupational Therapy

## 2015-12-23 DIAGNOSIS — I890 Lymphedema, not elsewhere classified: Secondary | ICD-10-CM | POA: Diagnosis not present

## 2015-12-23 DIAGNOSIS — M549 Dorsalgia, unspecified: Secondary | ICD-10-CM

## 2015-12-23 NOTE — Therapy (Signed)
[place] MAIN [facility] SERVICES [address] [place], [place], [address] Phone: [phone]   Fax:  [phone]  Occupational Therapy Treatment  Patient Details  Name: Vincent Mason MRN: [id number] Date of Birth: 01-24-1936 No Data Recorded  Encounter Date: 12/23/2015      OT End of Session - 12/23/15 1524    Visit Number 6   Number of Visits 36   Date for OT Re-Evaluation 02/26/16   OT Start Time 0206   OT Stop Time 0305   OT Time Calculation (min) 59 min   Equipment Utilized During Treatment Lymphedema Management Self-Care Workbook   Activity Tolerance Treatment limited secondary to medical complications (Comment);Patient tolerated treatment well;No increased pain   Behavior During Therapy [facility] for tasks assessed/performed;Restless      Past Medical History:  Diagnosis Date  . Atrial fibrillation and flutter [facility]) 2013   Dr Nehemiah Massed at [facility]  on [place].   Marland Kitchen COPD (chronic obstructive pulmonary disease) ([place])   . Hyperlipidemia   . Hypertension   . Kidney disease, chronic, stage III (moderate, EGFR 30-59 ml/min)    ARF 2013  . OSA (obstructive sleep apnea)    non-compliant with CPAP.     Past Surgical History:  Procedure Laterality Date  . CORONARY ARTERY BYPASS GRAFT  10/1997  . history of cervical discectomy  2009   C5-C6  . Hyperplastic colon polyp  02/2002   Sigmoid polyps    There were no vitals filed for this visit.      Subjective Assessment - 12/23/15 1519    Subjective  Pt presents for OT visit 6 for CDT for BLE lymphedema. Pt is accompanied by his wife.  Pt reports tolerance for compression about the same ~ 8 hrs after initially wrapped. Unclear if spouse is reapplying between visits.   Patient is accompained by: Family member   Pertinent History LE PRECAUTIONS/ CDT CONTRAINDICATIONS: uncontrolled CHF, COPD, AFib, CAD, HCC, Valve incompetence, CKD (stage III),    Limitations leg swelling, leg pain,difficulty  walking, decreased endurance and activity tolerance (SOB during exertion), decreased functional mobiliity and transfers, needs assistance with all basic and instrumental ADLs,  difficulty fitting lowewr body clothing and street clothes, decreased alertness, decreased swallowing ability ( by spouse report),    Patient Stated Goals Decrease swelling and keep it from getting worse   Pain Onset More than a month ago                      OT Treatments/Exercises (OP) - 12/23/15 0001      ADLs   ADL Education Given Yes     Manual Therapy   Manual Therapy Edema management;Compression Bandaging;Manual Lymphatic Drainage (MLD)   Manual therapy comments skin care with low pH castor oil  throughout MLD   Manual Lymphatic Drainage (MLD) Manual lymph drainage (MLD) to RLE in supine utilizing functional inguinal lymph nodes and deep abdominal lymphatics as is customary for non-cancer related lower extremity LE, including bilateral "short neck" sequence, J strokes to sub and supraclavicular LN, deep abdominal pathways, functional inguinal LN, lower extremity proximal to distal w/ emphasis on medial knee bottleneck and politeal LN. Performed fibrosis technique to B maleoli and distal  legs to address fatty fibrosis. Good tolerance.   Compression Bandaging LLE gradient compression wraps applied from toes to below knee: ( NO TOE WRAPS  x1 under cotton stockinett for this patient); 8 cm x 5 m x 1 to foot and ankle,  10 cm  x 5 m x 1, ( NO 12 cm x5 cm x 1 WRAP FOR THIS PATIENT) , all applied circumferentially in custommary layered gradient configuration  over .04 x 10 cm x 5 m Rosidol Soft x 1.5 rolls.                 OT Education - 12/23/15 1524    Education provided Yes   Education Details Continued skilled Pt/caregiver Education  And LE ADL training throughout visit for lymphedema self care, including compression wrapping, compression garment and device wear/care, lymphatic pumping ther ex,  simple self-MLD, and skin care. Discussed progress towards goals.   Person(s) Educated Patient;Spouse   Methods Explanation   Comprehension Verbalized understanding;Need further instruction             OT Long Term Goals - 11/28/15 1501      OT LONG TERM GOAL #1   Title Patient to demonstrate understanding of lymphedema etiology, progression, treatment  plan, and LE precautions to limit infection risk and progression.   Baseline dependent   Time 2   Period Weeks   Status New     OT LONG TERM GOAL #2   Title Lymphedema (LE) self-care: Pt able to apply multi layered, gradient compression wraps independently using proper techniques with Max caregiver assistance to achieve optimal limb volume reduction and to limit LE progression.   Baseline dependent   Time 12   Period Weeks   Status New     OT LONG TERM GOAL #3   Title Lymphedema (LE) self-care:  Pt to achieve at least 10% LLE limb volume reductions during Intensive CDT to limit LE progression, decrease infection risk, to reduce pain/ discomfort, and to improve safe ambulation and functional mobility.   Baseline dependent   Time 12   Period Weeks   Status New     OT LONG TERM GOAL #4   Title Lymphedema (LE) self-care:  Pt >/= 85 % compliant with all daily, LE self-care protocols for home program with Max caregiver assistance, including simple self-manual lymphatic drainage (MLD), skin care, lymphatic pumping the ex, skin care, and donning/ doffing compression wraps and garments o limit LE progression and further functional decline.     Baseline dependent   Time 12   Period Weeks   Status New     OT LONG TERM GOAL #5   Title Lymphedema (LE) self-care:  Pt to tolerate daily compression wraps, garments and devices in keeping w/ prescribed wear regime within 1 week of issue date to progress and retain clinical and functional gains and to limit LE progression.   Baseline dependent   Time 12   Period Weeks   Status New      Long Term Additional Goals   Additional Long Term Goals Yes     OT LONG TERM GOAL #6   Title Lymphedema (LE) self-care:  During Management Phase CDT Pt to sustain limb volume reductions achieved during Intensive Phase CDT within 5% utilizing LE self-care protocols, appropriate compression garments/ devices, and needed level of caregiver assistance.   Baseline dependent   Time 6   Period Months   Status New               Plan - 12/23/15 1525    Clinical Impression Statement Pt tolerating CDT with some difficulty toerating compression wraps > 8 hours after OT session. Spouse needs additional training and practice in clinic with compression wrap application. LLE continues to respond to  all  protocols with visual and palpable limb volunme reduction and decreasing tissue density. Soreness at L ankle most likely due to decongestion in this area. Skin hydration continues to improve. Skin color unchanges. Pt tends to display shuffling gate more some days than others. No complaints of periferal neuropath.  May coinsider introducing walker or cane.   Rehab Potential Fair   OT Treatment/Interventions Self-care/ADL training;DME and/or AE instruction;Manual lymph drainage;Patient/family education;Compression bandaging;Therapeutic exercises;Therapeutic exercise;Therapeutic activities;Energy conservation;Manual Therapy      Patient will benefit from skilled therapeutic intervention in order to improve the following deficits and impairments:  Decreased knowledge of use of DME, Decreased skin integrity, Increased edema, Cardiopulmonary status limiting activity, Decreased mobility, Other (comment), Decreased activity tolerance, Decreased endurance, Decreased strength, Decreased knowledge of precautions, Difficulty walking, Impaired flexibility, Pain, Impaired sensation, Decreased balance, Abnormal gait (decreased alerness and memory)  Visit Diagnosis: Lymphedema, not elsewhere classified    Problem  List Patient Active Problem List   Diagnosis Date Noted  . Hypomagnesemia 09/14/2015  . Oral thrush 09/14/2015  . Acute respiratory failure with hypoxia ([place]) 09/12/2015  . HCAP (healthcare-associated pneumonia) 09/12/2015  . C. difficile colitis 09/12/2015  . Acute on chronic diastolic CHF (congestive heart failure), NYHA class 1 ([place]) 09/12/2015  . Severe sepsis with septic shock ([place]) 09/12/2015  . Chronic atrial fibrillation ([place]) 09/12/2015  . Essential hypertension 09/12/2015  . ATN (acute tubular necrosis) ([place]) 09/12/2015  . Fecal occult blood test positive 09/12/2015  . Hepatic cirrhosis ([place])   . Abdominal pain   . Status post peripherally inserted central catheter (PICC) central line placement   . Acute on chronic renal failure ([place]) 09/03/2015  . Normocytic anemia 09/03/2015  . Opioid dependence ([place]) 09/03/2015  . Pulmonary arterial hypertension ([place]) 09/03/2015  . Acute encephalopathy 09/03/2015  . Open leg wound 09/02/2015  . Lymphedema 08/26/2015  . Hypokalemia 08/26/2015  . Hyperuricemia 07/09/2015  . Coronary artery disease 01/22/2015  . Chronic kidney disease (CKD), stage III (moderate) 01/22/2015  . Chronic constipation 01/22/2015  . COPD (chronic obstructive pulmonary disease) ([place]) 01/22/2015  . ED (erectile dysfunction) of organic origin 01/22/2015  . Lymphedema of both lower extremities 01/22/2015  . Fatigue 01/22/2015  . Blood in the urine 01/22/2015  . Calculus of kidney 01/22/2015  . Memory loss 01/22/2015  . Hypertensive pulmonary vascular disease ([place]) 01/22/2015  . Displacement of cervical intervertebral disc without myelopathy 01/22/2015  . Seizures ([place]) 01/17/2015  . Obstructive sleep apnea 01/17/2015  . Hyperlipidemia 01/17/2015  . Leg swelling 01/17/2015  . Degenerative disc disease, lumbar 01/17/2015  . Back pain 11/12/2014  . TI (tricuspid incompetence) 08/02/2014    Andrey Spearman, MS, OTR/L, [facility] 12/23/15 3:31 PM   [place] MAIN [facility] SERVICES [address] [place], [place], [address] Phone: [phone]   Fax:  [phone]  Name: BLEASE CAPALDI MRN: [id number] Date of Birth: Oct 25, 1935

## 2015-12-23 NOTE — Telephone Encounter (Signed)
Pt contacted office for refill request on the following medications: ° °oxyCODONE (ROXICODONE) 15 MG immediate release tablet ° °CB#[phone]/MW °

## 2015-12-23 NOTE — Patient Instructions (Signed)
LE instructions and precautions as established- see initial eval.   

## 2015-12-24 ENCOUNTER — Other Ambulatory Visit: Payer: Self-pay | Admitting: Family Medicine

## 2015-12-24 MED ORDER — OXYCODONE HCL 15 MG PO TABS: 15.0000 mg | ORAL_TABLET | Freq: Four times a day (QID) | ORAL | 0 refills | Status: DC | PRN

## 2015-12-25 ENCOUNTER — Ambulatory Visit: Payer: Medicare Other | Attending: Family Medicine | Admitting: Occupational Therapy

## 2015-12-25 DIAGNOSIS — I890 Lymphedema, not elsewhere classified: Secondary | ICD-10-CM

## 2015-12-25 NOTE — Patient Instructions (Signed)
LE instructions and precautions as established- see initial eval.  Remove long stretch compression wraps applied today if they cause increased swelling, discomfort, pain, or worsening blood circulation

## 2015-12-25 NOTE — Therapy (Signed)
[place] MAIN [facility] SERVICES [address] [place], [place], [address] Phone: [phone]   Fax:  [phone]  Occupational Therapy Treatment  Patient Details  Name: CORRIGAN KRETSCHMER MRN: [id number] Date of Birth: 1936-05-21 No Data Recorded  Encounter Date: 12/25/2015      OT End of Session - 12/25/15 1705    Visit Number 7   Number of Visits 36   Date for OT Re-Evaluation 02/26/16   OT Start Time 0211   OT Stop Time 0259   OT Time Calculation (min) 48 min   Equipment Utilized During Treatment Lymphedema Management Self-Care Workbook   Activity Tolerance Treatment limited secondary to medical complications (Comment);Patient tolerated treatment well;No increased pain   Behavior During Therapy [facility] for tasks assessed/performed;Restless      Past Medical History:  Diagnosis Date  . Atrial fibrillation and flutter [facility]) 2013   Dr Nehemiah Massed at [facility]  on [place].   Marland Kitchen COPD (chronic obstructive pulmonary disease) ([place])   . Hyperlipidemia   . Hypertension   . Kidney disease, chronic, stage III (moderate, EGFR 30-59 ml/min)    ARF 2013  . OSA (obstructive sleep apnea)    non-compliant with CPAP.     Past Surgical History:  Procedure Laterality Date  . CORONARY ARTERY BYPASS GRAFT  10/1997  . history of cervical discectomy  2009   C5-C6  . Hyperplastic colon polyp  02/2002   Sigmoid polyps    There were no vitals filed for this visit.      Subjective Assessment - 12/25/15 1659    Subjective  Pt presents for OT visit 79fr CDT for BLE lymphedema. Pt is accompanied by his wife. Pt reports BLE are particularly tender to palpation at present.   Patient is accompained by: Family member   Pertinent History LE PRECAUTIONS/ CDT CONTRAINDICATIONS: uncontrolled CHF, COPD, AFib, CAD, HCC, Valve incompetence, CKD (stage III),    Limitations leg swelling, leg pain,difficulty walking, decreased endurance and activity tolerance (SOB during exertion),  decreased functional mobiliity and transfers, needs assistance with all basic and instrumental ADLs,  difficulty fitting lowewr body clothing and street clothes, decreased alertness, decreased swallowing ability ( by spouse report),    Patient Stated Goals Decrease swelling and keep it from getting worse   Currently in Pain? Yes  not numerically rated. Legs increasingly "tender " below knees.   Pain Onset More than a month ago                      OT Treatments/Exercises (OP) - 12/25/15 0001      ADLs   ADL Education Given Yes     Manual Therapy   Manual Therapy Edema management;Manual Lymphatic Drainage (MLD);Compression Bandaging   Manual therapy comments skin care with low pH castor oil  throughout MLD   Manual Lymphatic Drainage (MLD) Manual lymph drainage (MLD) to RLE in supine utilizing functional inguinal lymph nodes and deep abdominal lymphatics as is customary for non-cancer related lower extremity LE, including bilateral "short neck" sequence, J strokes to sub and supraclavicular LN, deep abdominal pathways, functional inguinal LN, lower extremity proximal to distal w/ emphasis on medial knee bottleneck and politeal LN. Performed fibrosis technique to B maleoli and distal  legs to address fatty fibrosis. Good tolerance.   Compression Bandaging Modified compression wrap today in effort to improve comfort and tolerance. Used small, 10 cm ace wrap on foot and ankle, and one 15 cm ace wrap overlapping at ankle  and extending circumferentially to below knee.                OT Education - 12/25/15 1704    Education provided Yes   Education Details Continued skilled Pt/caregiver Education  And LE ADL training throughout visit for lymphedema self care, including compression wrapping, compression garment and device wear/care, lymphatic pumping ther ex, simple self-MLD, and skin care. Discussed progress towards goals.   Person(s) Educated Patient;Spouse   Methods  Explanation;Demonstration;Handout   Comprehension Verbalized understanding;Need further instruction             OT Long Term Goals - 11/28/15 1501      OT LONG TERM GOAL #1   Title Patient to demonstrate understanding of lymphedema etiology, progression, treatment  plan, and LE precautions to limit infection risk and progression.   Baseline dependent   Time 2   Period Weeks   Status New     OT LONG TERM GOAL #2   Title Lymphedema (LE) self-care: Pt able to apply multi layered, gradient compression wraps independently using proper techniques with Max caregiver assistance to achieve optimal limb volume reduction and to limit LE progression.   Baseline dependent   Time 12   Period Weeks   Status New     OT LONG TERM GOAL #3   Title Lymphedema (LE) self-care:  Pt to achieve at least 10% LLE limb volume reductions during Intensive CDT to limit LE progression, decrease infection risk, to reduce pain/ discomfort, and to improve safe ambulation and functional mobility.   Baseline dependent   Time 12   Period Weeks   Status New     OT LONG TERM GOAL #4   Title Lymphedema (LE) self-care:  Pt >/= 85 % compliant with all daily, LE self-care protocols for home program with Max caregiver assistance, including simple self-manual lymphatic drainage (MLD), skin care, lymphatic pumping the ex, skin care, and donning/ doffing compression wraps and garments o limit LE progression and further functional decline.     Baseline dependent   Time 12   Period Weeks   Status New     OT LONG TERM GOAL #5   Title Lymphedema (LE) self-care:  Pt to tolerate daily compression wraps, garments and devices in keeping w/ prescribed wear regime within 1 week of issue date to progress and retain clinical and functional gains and to limit LE progression.   Baseline dependent   Time 12   Period Weeks   Status New     Long Term Additional Goals   Additional Long Term Goals Yes     OT LONG TERM GOAL #6    Title Lymphedema (LE) self-care:  During Management Phase CDT Pt to sustain limb volume reductions achieved during Intensive Phase CDT within 5% utilizing LE self-care protocols, appropriate compression garments/ devices, and needed level of caregiver assistance.   Baseline dependent   Time 6   Period Months   Status New               Plan - 12/25/15 1705    Clinical Impression Statement Pt continues to have difficulty tolerating knee length gradient compression greater than 8 hours after application due to worsening leg pain and tenderness as limb volume decreases. We tried very light ace application today withh only light over lap at ankle in effort to improve comfort and tolerance. Skin hydration continues to improve. Coloration is unchanged. Swelling continues to decrease. LLE is responding well and Pt is more tolerant of  burden of LE care as we progress.       Patient will benefit from skilled therapeutic intervention in order to improve the following deficits and impairments:     Visit Diagnosis: Lymphedema, not elsewhere classified    Problem List Patient Active Problem List   Diagnosis Date Noted  . Hypomagnesemia 09/14/2015  . Oral thrush 09/14/2015  . Acute respiratory failure with hypoxia ([place]) 09/12/2015  . HCAP (healthcare-associated pneumonia) 09/12/2015  . C. difficile colitis 09/12/2015  . Acute on chronic diastolic CHF (congestive heart failure), NYHA class 1 ([place]) 09/12/2015  . Severe sepsis with septic shock ([place]) 09/12/2015  . Chronic atrial fibrillation ([place]) 09/12/2015  . Essential hypertension 09/12/2015  . ATN (acute tubular necrosis) ([place]) 09/12/2015  . Fecal occult blood test positive 09/12/2015  . Hepatic cirrhosis ([place])   . Abdominal pain   . Status post peripherally inserted central catheter (PICC) central line placement   . Acute on chronic renal failure ([place]) 09/03/2015  . Normocytic anemia 09/03/2015  . Opioid dependence ([place]) 09/03/2015   . Pulmonary arterial hypertension ([place]) 09/03/2015  . Acute encephalopathy 09/03/2015  . Open leg wound 09/02/2015  . Lymphedema 08/26/2015  . Hypokalemia 08/26/2015  . Hyperuricemia 07/09/2015  . Coronary artery disease 01/22/2015  . Chronic kidney disease (CKD), stage III (moderate) 01/22/2015  . Chronic constipation 01/22/2015  . COPD (chronic obstructive pulmonary disease) ([place]) 01/22/2015  . ED (erectile dysfunction) of organic origin 01/22/2015  . Lymphedema of both lower extremities 01/22/2015  . Fatigue 01/22/2015  . Blood in the urine 01/22/2015  . Calculus of kidney 01/22/2015  . Memory loss 01/22/2015  . Hypertensive pulmonary vascular disease ([place]) 01/22/2015  . Displacement of cervical intervertebral disc without myelopathy 01/22/2015  . Seizures ([place]) 01/17/2015  . Obstructive sleep apnea 01/17/2015  . Hyperlipidemia 01/17/2015  . Leg swelling 01/17/2015  . Degenerative disc disease, lumbar 01/17/2015  . Back pain 11/12/2014  . TI (tricuspid incompetence) 08/02/2014    Andrey Spearman, MS, OTR/L, [facility] 12/25/15 5:11 PM   [place] MAIN [facility] SERVICES [address] [place], [place], [address] Phone: [phone]   Fax:  [phone]  Name: ELMOR KOST MRN: [id number] Date of Birth: December 13, 1935

## 2015-12-27 ENCOUNTER — Ambulatory Visit: Payer: Medicare Other | Admitting: Occupational Therapy

## 2015-12-27 DIAGNOSIS — I890 Lymphedema, not elsewhere classified: Secondary | ICD-10-CM | POA: Diagnosis not present

## 2015-12-27 NOTE — Patient Instructions (Signed)
LE instructions and precautions as established- see initial eval.   

## 2015-12-27 NOTE — Therapy (Signed)
[place] MAIN [facility] SERVICES [address] [place], [place], [address] Phone: [phone]   Fax:  [phone]  Occupational Therapy Treatment  Patient Details  Name: Vincent Mason MRN: [id number] Date of Birth: 1936-01-19 No Data Recorded  Encounter Date: 12/27/2015      OT End of Session - 12/27/15 1403    Visit Number 8   Number of Visits 36   Date for OT Re-Evaluation 02/26/16   OT Start Time 0100   OT Stop Time 0155   OT Time Calculation (min) 55 min   Equipment Utilized During Treatment Lymphedema Management Self-Care Workbook   Activity Tolerance Treatment limited secondary to medical complications (Comment);Patient tolerated treatment well;No increased pain   Behavior During Therapy [facility] for tasks assessed/performed;Restless      Past Medical History:  Diagnosis Date  . Atrial fibrillation and flutter [facility]) 2013   Dr Nehemiah Massed at [facility]  on [place].   Marland Kitchen COPD (chronic obstructive pulmonary disease) ([place])   . Hyperlipidemia   . Hypertension   . Kidney disease, chronic, stage III (moderate, EGFR 30-59 ml/min)    ARF 2013  . OSA (obstructive sleep apnea)    non-compliant with CPAP.     Past Surgical History:  Procedure Laterality Date  . CORONARY ARTERY BYPASS GRAFT  10/1997  . history of cervical discectomy  2009   C5-C6  . Hyperplastic colon polyp  02/2002   Sigmoid polyps    There were no vitals filed for this visit.      Subjective Assessment - 12/27/15 1356    Subjective  Pt presents for OT visit 8 to address BLE lymphedema. Pt reports Ace wraps applied to LLE last session were more comfortable than short stretch wraps used for  compression for most visits.    Pertinent History LE PRECAUTIONS/ CDT CONTRAINDICATIONS: uncontrolled CHF, COPD, AFib, CAD, HCC, Valve incompetence, CKD (stage III),    Limitations leg swelling, leg pain,difficulty walking, decreased endurance and activity tolerance (SOB during exertion),  decreased functional mobiliity and transfers, needs assistance with all basic and instrumental ADLs,  difficulty fitting lowewr body clothing and street clothes, decreased alertness, decreased swallowing ability ( by spouse report),    Patient Stated Goals Decrease swelling and keep it from getting worse   Pain Onset More than a month ago                      OT Treatments/Exercises (OP) - 12/27/15 0001      ADLs   ADL Education Given Yes     Manual Therapy   Manual Therapy Edema management;Manual Lymphatic Drainage (MLD);Compression Bandaging   Manual therapy comments skin care with low pH castor oil  throughout MLD   Manual Lymphatic Drainage (MLD) Manual lymph drainage (MLD) to RLE in supine utilizing functional inguinal lymph nodes and deep abdominal lymphatics as is customary for non-cancer related lower extremity LE, including bilateral "short neck" sequence, J strokes to sub and supraclavicular LN, deep abdominal pathways, functional inguinal LN, lower extremity proximal to distal w/ emphasis on medial knee bottleneck and politeal LN. Performed fibrosis technique to B maleoli and distal  legs to address fatty fibrosis. Good tolerance.   Compression Bandaging Loaned Circ Aid adjustable knee length alternative compression garment for weekend trial . Pt utilizing over 3" cotton stockinett                OT Education - 12/27/15 1401    Education provided Yes  Education Details Emphasis of self care edu today on ajustable compression garments as alternative to traditional elastic compression stockings. By end of session Pt trialed 3 different devices, selected the most comfortable, and was able to doff them with mod A. Spouse was able to don and doff with min A.   Person(s) Educated Patient   Methods Explanation;Demonstration;Verbal cues;Tactile cues   Comprehension Returned demonstration;Verbalized understanding;Verbal cues required;Tactile cues required;Need further  instruction             OT Long Term Goals - 11/28/15 1501      OT LONG TERM GOAL #1   Title Patient to demonstrate understanding of lymphedema etiology, progression, treatment  plan, and LE precautions to limit infection risk and progression.   Baseline dependent   Time 2   Period Weeks   Status New     OT LONG TERM GOAL #2   Title Lymphedema (LE) self-care: Pt able to apply multi layered, gradient compression wraps independently using proper techniques with Max caregiver assistance to achieve optimal limb volume reduction and to limit LE progression.   Baseline dependent   Time 12   Period Weeks   Status New     OT LONG TERM GOAL #3   Title Lymphedema (LE) self-care:  Pt to achieve at least 10% LLE limb volume reductions during Intensive CDT to limit LE progression, decrease infection risk, to reduce pain/ discomfort, and to improve safe ambulation and functional mobility.   Baseline dependent   Time 12   Period Weeks   Status New     OT LONG TERM GOAL #4   Title Lymphedema (LE) self-care:  Pt >/= 85 % compliant with all daily, LE self-care protocols for home program with Max caregiver assistance, including simple self-manual lymphatic drainage (MLD), skin care, lymphatic pumping the ex, skin care, and donning/ doffing compression wraps and garments o limit LE progression and further functional decline.     Baseline dependent   Time 12   Period Weeks   Status New     OT LONG TERM GOAL #5   Title Lymphedema (LE) self-care:  Pt to tolerate daily compression wraps, garments and devices in keeping w/ prescribed wear regime within 1 week of issue date to progress and retain clinical and functional gains and to limit LE progression.   Baseline dependent   Time 12   Period Weeks   Status New     Long Term Additional Goals   Additional Long Term Goals Yes     OT LONG TERM GOAL #6   Title Lymphedema (LE) self-care:  During Management Phase CDT Pt to sustain limb volume  reductions achieved during Intensive Phase CDT within 5% utilizing LE self-care protocols, appropriate compression garments/ devices, and needed level of caregiver assistance.   Baseline dependent   Time 6   Period Months   Status New               Plan - 12/27/15 1406    Clinical Impression Statement After skilled teaching Pt able to select adjustable knee length Circ Aid as alternative to traditional elastic compression stockings for comfort and eas of donning and doffing. Pt able to doff device  with mod A. Spouse able to don and doff with min A., including verball and physical cues. Pt and spouse agree with plan for OT to request benefits check and garment estimate from local vendor by submitting demographics. Will proceed with garment fitting portion of CDT ASAP.  Pt demonstrating some  mild dizziness and unstable balance when rising from chair to walk into clinic and when rising from treatment plith to leave. Pt and spouse in agreement with OT recommendation  that  Pt would benefit from PT evaluation of balance, muscle strength, and vestibular function in an effort to limit fall risk.    Rehab Potential Good   OT Frequency 3x / week   OT Duration 12 weeks   OT Treatment/Interventions Self-care/ADL training;DME and/or AE instruction;Manual lymph drainage;Patient/family education;Compression bandaging;Therapeutic exercises;Therapeutic exercise;Therapeutic activities;Energy conservation;Manual Therapy      Patient will benefit from skilled therapeutic intervention in order to improve the following deficits and impairments:  Decreased knowledge of use of DME, Decreased skin integrity, Increased edema, Cardiopulmonary status limiting activity, Decreased mobility, Other (comment), Decreased activity tolerance, Decreased endurance, Decreased strength, Decreased knowledge of precautions, Difficulty walking, Impaired flexibility, Pain, Impaired sensation, Decreased balance, Abnormal gait  Visit  Diagnosis: Lymphedema, not elsewhere classified    Problem List Patient Active Problem List   Diagnosis Date Noted  . Hypomagnesemia 09/14/2015  . Oral thrush 09/14/2015  . Acute respiratory failure with hypoxia ([place]) 09/12/2015  . HCAP (healthcare-associated pneumonia) 09/12/2015  . C. difficile colitis 09/12/2015  . Acute on chronic diastolic CHF (congestive heart failure), NYHA class 1 ([place]) 09/12/2015  . Severe sepsis with septic shock ([place]) 09/12/2015  . Chronic atrial fibrillation ([place]) 09/12/2015  . Essential hypertension 09/12/2015  . ATN (acute tubular necrosis) ([place]) 09/12/2015  . Fecal occult blood test positive 09/12/2015  . Hepatic cirrhosis ([place])   . Abdominal pain   . Status post peripherally inserted central catheter (PICC) central line placement   . Acute on chronic renal failure ([place]) 09/03/2015  . Normocytic anemia 09/03/2015  . Opioid dependence ([place]) 09/03/2015  . Pulmonary arterial hypertension ([place]) 09/03/2015  . Acute encephalopathy 09/03/2015  . Open leg wound 09/02/2015  . Lymphedema 08/26/2015  . Hypokalemia 08/26/2015  . Hyperuricemia 07/09/2015  . Coronary artery disease 01/22/2015  . Chronic kidney disease (CKD), stage III (moderate) 01/22/2015  . Chronic constipation 01/22/2015  . COPD (chronic obstructive pulmonary disease) ([place]) 01/22/2015  . ED (erectile dysfunction) of organic origin 01/22/2015  . Lymphedema of both lower extremities 01/22/2015  . Fatigue 01/22/2015  . Blood in the urine 01/22/2015  . Calculus of kidney 01/22/2015  . Memory loss 01/22/2015  . Hypertensive pulmonary vascular disease ([place]) 01/22/2015  . Displacement of cervical intervertebral disc without myelopathy 01/22/2015  . Seizures ([place]) 01/17/2015  . Obstructive sleep apnea 01/17/2015  . Hyperlipidemia 01/17/2015  . Leg swelling 01/17/2015  . Degenerative disc disease, lumbar 01/17/2015  . Back pain 11/12/2014  . TI (tricuspid incompetence) 08/02/2014     Andrey Spearman, MS, OTR/L, [facility] 12/27/15 2:15 PM   [place] MAIN [facility] SERVICES [address] [place], [place], [address] Phone: [phone]   Fax:  [phone]  Name: Vincent Mason MRN: [id number] Date of Birth: [birth year]

## 2015-12-30 ENCOUNTER — Telehealth: Payer: Self-pay | Admitting: Family Medicine

## 2015-12-30 ENCOUNTER — Ambulatory Visit: Payer: Medicare Other | Admitting: Occupational Therapy

## 2015-12-30 DIAGNOSIS — I890 Lymphedema, not elsewhere classified: Secondary | ICD-10-CM

## 2015-12-30 DIAGNOSIS — R2681 Unsteadiness on feet: Secondary | ICD-10-CM

## 2015-12-30 NOTE — Telephone Encounter (Signed)
Please advise 

## 2015-12-30 NOTE — Patient Instructions (Signed)
LE instructions and precautions as established- see initial eval.   

## 2015-12-30 NOTE — Telephone Encounter (Signed)
Stephani Police from [facility] occupational therapy called stating that pt has unstable gait. She feels that pt would benefit from physical therapy.Call back # [phone]

## 2015-12-30 NOTE — Telephone Encounter (Signed)
OK to order physical therapy for gait training

## 2015-12-31 NOTE — Telephone Encounter (Signed)
Order entered; please refer.  Thanks,   -Mickel Baas

## 2016-01-01 ENCOUNTER — Encounter: Payer: Medicare Other | Admitting: Occupational Therapy

## 2016-01-01 NOTE — Telephone Encounter (Signed)
error 

## 2016-01-02 ENCOUNTER — Ambulatory Visit: Payer: Medicare Other | Admitting: Occupational Therapy

## 2016-01-02 DIAGNOSIS — I890 Lymphedema, not elsewhere classified: Secondary | ICD-10-CM

## 2016-01-02 NOTE — Therapy (Signed)
[place] MAIN [facility] SERVICES [address] [place], [place], [address] Phone: [phone]   Fax:  [phone]  Occupational Therapy Treatment & Progress Note  Patient Details  Name: Vincent Mason MRN: [id number] Date of Birth: 06-18-1935 No Data Recorded  Encounter Date: 01/02/2016      OT End of Session - 01/02/16 1241    Visit Number 10   Number of Visits 36   Date for OT Re-Evaluation 02/26/16   OT Start Time 1115   OT Stop Time 1215   OT Time Calculation (min) 60 min   Equipment Utilized During Treatment Lymphedema Management Self-Care Workbook   Activity Tolerance Treatment limited secondary to medical complications (Comment);Patient tolerated treatment well;No increased pain   Behavior During Therapy [facility] for tasks assessed/performed;Restless      Past Medical History:  Diagnosis Date  . Atrial fibrillation and flutter [facility]) 2013   Dr Nehemiah Massed at [facility]  on [place].   Marland Kitchen COPD (chronic obstructive pulmonary disease) ([place])   . Hyperlipidemia   . Hypertension   . Kidney disease, chronic, stage III (moderate, EGFR 30-59 ml/min)    ARF 2013  . OSA (obstructive sleep apnea)    non-compliant with CPAP.     Past Surgical History:  Procedure Laterality Date  . CORONARY ARTERY BYPASS GRAFT  10/1997  . history of cervical discectomy  2009   C5-C6  . Hyperplastic colon polyp  02/2002   Sigmoid polyps    There were no vitals filed for this visit.      Subjective Assessment - 01/02/16 1236    Subjective  Pt presents for OT visit 10 tto address BLE lymphedema. Pt is not wearing compression to clinic , but tells me he wore them all day yesterday during visit interval.     Patient is accompained by: Family member   Pertinent History LE PRECAUTIONS/ CDT CONTRAINDICATIONS: uncontrolled CHF, COPD, AFib, CAD, HCC, Valve incompetence, CKD (stage III),    Limitations leg swelling, leg pain,difficulty walking, decreased endurance and activity  tolerance (SOB during exertion), decreased functional mobiliity and transfers, needs assistance with all basic and instrumental ADLs,  difficulty fitting lowewr body clothing and street clothes, decreased alertness, decreased swallowing ability ( by spouse report),    Patient Stated Goals Decrease swelling and keep it from getting worse   Currently in Pain? Yes  not rated numerically   Pain Onset More than a month ago                      OT Treatments/Exercises (OP) - 01/02/16 0001      ADLs   ADL Education Given Yes     Manual Therapy   Manual Therapy Edema management;Manual Lymphatic Drainage (MLD);Compression Bandaging   Manual therapy comments BLE are more swollen below the knees again  today. ! cm skin tear observed on lateral aspect of LLE is healing well. No lymphorrhea observed today.    Compression Bandaging LLE gradient compression wraps applied from toes to below knee: ( NO TOE WRAPS  x1 under cotton stockinett for this patient); 8 cm x 5 m x 1 to foot and ankle, 10 cm  x 5 m x 1, ( NO 12 cm x5 cm x 1 WRAP FOR THIS PATIENT) , all applied circumferentially in custommary layered gradient configuration  over .04 x 10 cm x 5 m Rosidol Soft x 1.5 rolls.  OT Education - 01/02/16 1238    Education provided Yes   Education Details Continued skilled Pt/caregiver Education  And LE ADL training throughout visit for lymphedema self care, including compression wrapping, compression garment and device wear/care, lymphatic pumping ther ex, simple self-MLD, and skin care. Discussed progress towards goals.   Person(s) Educated Patient;Spouse   Methods Explanation   Comprehension Verbalized understanding             OT Long Term Goals - 01/02/16 1241      OT LONG TERM GOAL #1   Title Patient to demonstrate understanding of lymphedema etiology, progression, treatment  plan, and LE precautions to limit infection risk and progression.   Baseline  independent   Time 2   Period Weeks   Status Achieved     OT LONG TERM GOAL #2   Title Lymphedema (LE) self-care: Pt able to apply multi layered, gradient compression wraps independently using proper techniques with Max caregiver assistance to achieve optimal limb volume reduction and to limit LE progression.   Baseline dependent   Time 12   Period Weeks   Status Partially Met     OT LONG TERM GOAL #3   Title Lymphedema (LE) self-care:  Pt to achieve at least 10% LLE limb volume reductions during Intensive CDT to limit LE progression, decrease infection risk, to reduce pain/ discomfort, and to improve safe ambulation and functional mobility.   Baseline dependent   Time 12   Period Weeks   Status Partially Met     OT LONG TERM GOAL #4   Title Lymphedema (LE) self-care:  Pt >/= 85 % compliant with all daily, LE self-care protocols for home program with Max caregiver assistance, including simple self-manual lymphatic drainage (MLD), skin care, lymphatic pumping the ex, skin care, and donning/ doffing compression wraps and garments o limit LE progression and further functional decline.     Time 12   Period Weeks   Status Partially Met     OT LONG TERM GOAL #5   Title Lymphedema (LE) self-care:  Pt to tolerate daily compression wraps, garments and devices in keeping w/ prescribed wear regime within 1 week of issue date to progress and retain clinical and functional gains and to limit LE progression.   Baseline dependent   Time 12   Period Weeks   Status Partially Met     OT LONG TERM GOAL #6   Title Lymphedema (LE) self-care:  During Management Phase CDT Pt to sustain limb volume reductions achieved during Intensive Phase CDT within 5% utilizing LE self-care protocols, appropriate compression garments/ devices, and needed level of caregiver assistance.   Baseline dependent   Time 6   Period Months   Status On-going               Plan - 01/02/16 1245    Clinical Impression  Statement Pt tolerated treatment well today and visible and palpable LLE limb volume reduction achieved by end of session. Pt demonstrates progress towards all OT goals for LE care. Please refer to G Codes section for details for each objective. Cont as per POC. No  response from garment vendor today re garments.   Rehab Potential Good   OT Frequency 3x / week   OT Duration 12 weeks   OT Treatment/Interventions Self-care/ADL training;DME and/or AE instruction;Manual lymph drainage;Patient/family education;Compression bandaging;Therapeutic exercises;Therapeutic exercise;Therapeutic activities;Energy conservation;Manual Therapy      Patient will benefit from skilled therapeutic intervention in order to improve the following deficits and  impairments:  Decreased knowledge of use of DME, Decreased skin integrity, Increased edema, Cardiopulmonary status limiting activity, Decreased mobility, Other (comment), Decreased activity tolerance, Decreased endurance, Decreased strength, Decreased knowledge of precautions, Difficulty walking, Impaired flexibility, Pain, Impaired sensation, Decreased balance, Abnormal gait  Visit Diagnosis: Lymphedema, not elsewhere classified      G-Codes - 2016-01-12 1244    Functional Assessment Tool Used Clinical observation and examination, medical records review, Pt and caregiver interview, comparative limb volumetrics   Functional Limitation Self care   Self Care Current Status (F5436) At least 80 percent but less than 100 percent impaired, limited or restricted   Self Care Goal Status (G6770) At least 20 percent but less than 40 percent impaired, limited or restricted      Problem List Patient Active Problem List   Diagnosis Date Noted  . Hypomagnesemia 09/14/2015  . Oral thrush 09/14/2015  . Acute respiratory failure with hypoxia ([place]) 09/12/2015  . HCAP (healthcare-associated pneumonia) 09/12/2015  . C. difficile colitis 09/12/2015  . Acute on chronic diastolic  CHF (congestive heart failure), NYHA class 1 ([place]) 09/12/2015  . Severe sepsis with septic shock ([place]) 09/12/2015  . Chronic atrial fibrillation ([place]) 09/12/2015  . Essential hypertension 09/12/2015  . ATN (acute tubular necrosis) ([place]) 09/12/2015  . Fecal occult blood test positive 09/12/2015  . Hepatic cirrhosis ([place])   . Abdominal pain   . Status post peripherally inserted central catheter (PICC) central line placement   . Acute on chronic renal failure ([place]) 09/03/2015  . Normocytic anemia 09/03/2015  . Opioid dependence ([place]) 09/03/2015  . Pulmonary arterial hypertension ([place]) 09/03/2015  . Acute encephalopathy 09/03/2015  . Open leg wound 09/02/2015  . Lymphedema 08/26/2015  . Hypokalemia 08/26/2015  . Hyperuricemia 07/09/2015  . Coronary artery disease 01/22/2015  . Chronic kidney disease (CKD), stage III (moderate) 01/22/2015  . Chronic constipation 01/22/2015  . COPD (chronic obstructive pulmonary disease) ([place]) 01/22/2015  . ED (erectile dysfunction) of organic origin 01/22/2015  . Lymphedema of both lower extremities 01/22/2015  . Fatigue 01/22/2015  . Blood in the urine 01/22/2015  . Calculus of kidney 01/22/2015  . Memory loss 01/22/2015  . Hypertensive pulmonary vascular disease ([place]) 01/22/2015  . Displacement of cervical intervertebral disc without myelopathy 01/22/2015  . Seizures ([place]) 01/17/2015  . Obstructive sleep apnea 01/17/2015  . Hyperlipidemia 01/17/2015  . Leg swelling 01/17/2015  . Degenerative disc disease, lumbar 01/17/2015  . Back pain 11/12/2014  . TI (tricuspid incompetence) 08/02/2014   Andrey Spearman, MS, OTR/L, [facility] 01-12-2016 12:47 PM    [place] MAIN [facility] SERVICES [address] [place], [place], [address] Phone: [phone]   Fax:  [phone]  Name: ASHYR HEDGEPATH MRN: [id number] Date of Birth: 18-Sep-1935

## 2016-01-03 ENCOUNTER — Encounter: Payer: Medicare Other | Admitting: Occupational Therapy

## 2016-01-06 ENCOUNTER — Ambulatory Visit: Payer: Medicare Other | Admitting: Occupational Therapy

## 2016-01-06 DIAGNOSIS — I890 Lymphedema, not elsewhere classified: Secondary | ICD-10-CM | POA: Diagnosis not present

## 2016-01-06 NOTE — Therapy (Signed)
[place] MAIN [facility] SERVICES [address] [place], [place], [address] Phone: [phone]   Fax:  [phone]  Occupational Therapy Treatment  Patient Details  Name: Vincent Mason MRN: [id number] Date of Birth: March 21, 1936 No Data Recorded  Encounter Date: 01/06/2016      OT End of Session - 01/06/16 1229    Visit Number 11   Number of Visits 36   Date for OT Re-Evaluation 02/26/16   OT Start Time 1015   OT Stop Time 1105   OT Time Calculation (min) 50 min   Activity Tolerance Patient tolerated treatment well;No increased pain   Behavior During Therapy WFL for tasks assessed/performed      Past Medical History:  Diagnosis Date  . Atrial fibrillation and flutter [facility]) 2013   Dr Nehemiah Massed at [facility]  on [place].   Marland Kitchen COPD (chronic obstructive pulmonary disease) ([place])   . Hyperlipidemia   . Hypertension   . Kidney disease, chronic, stage III (moderate, EGFR 30-59 ml/min)    ARF 2013  . OSA (obstructive sleep apnea)    non-compliant with CPAP.     Past Surgical History:  Procedure Laterality Date  . CORONARY ARTERY BYPASS GRAFT  10/1997  . history of cervical discectomy  2009   C5-C6  . Hyperplastic colon polyp  02/2002   Sigmoid polyps    There were no vitals filed for this visit.      Subjective Assessment - 01/06/16 1226    Subjective  Pt presents for OT visit 11 to address BLE lymphedema. Pt is not wearing compression wraps / garment alternative to clinic. Pt is accompanied by his spouse, [name].   Patient is accompained by: Family member   Pertinent History LE PRECAUTIONS/ CDT CONTRAINDICATIONS: uncontrolled CHF, COPD, AFib, CAD, HCC, Valve incompetence, CKD (stage III),    Limitations leg swelling, leg pain,difficulty walking, decreased endurance and activity tolerance (SOB during exertion), decreased functional mobiliity and transfers, needs assistance with all basic and instrumental ADLs,  difficulty fitting lowewr body  clothing and street clothes, decreased alertness, decreased swallowing ability ( by spouse report),    Patient Stated Goals Decrease swelling and keep it from getting worse   Currently in Pain? No/denies   Pain Onset More than a month ago                      OT Treatments/Exercises (OP) - 01/06/16 0001      Manual Therapy   Manual Therapy Edema management;Manual Lymphatic Drainage (MLD);Compression Bandaging   Manual therapy comments BLE are more swollen below the knees again  today. ! cm skin tear observed on lateral aspect of LLE is healing well. No lymphorrhea observed today.    Compression Bandaging 2 layer ace wrap applied lightly to LLE below knee as Pt finds these more comfortable than short stretch wraps. Loaned CircAid over clean cotton stockinett to RLE. No foot pieces under street shoes.                 OT Education - 01/06/16 1227    Education provided No             OT Long Term Goals - 01/02/16 1241      OT LONG TERM GOAL #1   Title Patient to demonstrate understanding of lymphedema etiology, progression, treatment  plan, and LE precautions to limit infection risk and progression.   Baseline independent   Time 2   Period Weeks  Status Achieved     OT LONG TERM GOAL #2   Title Lymphedema (LE) self-care: Pt able to apply multi layered, gradient compression wraps independently using proper techniques with Max caregiver assistance to achieve optimal limb volume reduction and to limit LE progression.   Baseline dependent   Time 12   Period Weeks   Status Partially Met     OT LONG TERM GOAL #3   Title Lymphedema (LE) self-care:  Pt to achieve at least 10% LLE limb volume reductions during Intensive CDT to limit LE progression, decrease infection risk, to reduce pain/ discomfort, and to improve safe ambulation and functional mobility.   Baseline dependent   Time 12   Period Weeks   Status Partially Met     OT LONG TERM GOAL #4   Title  Lymphedema (LE) self-care:  Pt >/= 85 % compliant with all daily, LE self-care protocols for home program with Max caregiver assistance, including simple self-manual lymphatic drainage (MLD), skin care, lymphatic pumping the ex, skin care, and donning/ doffing compression wraps and garments o limit LE progression and further functional decline.     Time 12   Period Weeks   Status Partially Met     OT LONG TERM GOAL #5   Title Lymphedema (LE) self-care:  Pt to tolerate daily compression wraps, garments and devices in keeping w/ prescribed wear regime within 1 week of issue date to progress and retain clinical and functional gains and to limit LE progression.   Baseline dependent   Time 12   Period Weeks   Status Partially Met     OT LONG TERM GOAL #6   Title Lymphedema (LE) self-care:  During Management Phase CDT Pt to sustain limb volume reductions achieved during Intensive Phase CDT within 5% utilizing LE self-care protocols, appropriate compression garments/ devices, and needed level of caregiver assistance.   Baseline dependent   Time 6   Period Months   Status On-going               Plan - 01/06/16 1230    Clinical Impression Statement each visit. Cont as per POC. Vendor emailed to let me know she was calling Pt w/ quote for CircAids.   Rehab Potential Good   OT Frequency 3x / week   OT Duration 12 weeks   OT Treatment/Interventions Self-care/ADL training;DME and/or AE instruction;Manual lymph drainage;Patient/family education;Compression bandaging;Therapeutic exercises;Therapeutic exercise;Therapeutic activities;Energy conservation;Manual Therapy      Patient will benefit from skilled therapeutic intervention in order to improve the following deficits and impairments:  Decreased knowledge of use of DME, Decreased skin integrity, Increased edema, Cardiopulmonary status limiting activity, Decreased mobility, Other (comment), Decreased activity tolerance, Decreased endurance,  Decreased strength, Decreased knowledge of precautions, Difficulty walking, Impaired flexibility, Pain, Impaired sensation, Decreased balance, Abnormal gait  Visit Diagnosis: Lymphedema, not elsewhere classified    Problem List Patient Active Problem List   Diagnosis Date Noted  . Hypomagnesemia 09/14/2015  . Oral thrush 09/14/2015  . Acute respiratory failure with hypoxia ([place]) 09/12/2015  . HCAP (healthcare-associated pneumonia) 09/12/2015  . C. difficile colitis 09/12/2015  . Acute on chronic diastolic CHF (congestive heart failure), NYHA class 1 ([place]) 09/12/2015  . Severe sepsis with septic shock ([place]) 09/12/2015  . Chronic atrial fibrillation ([place]) 09/12/2015  . Essential hypertension 09/12/2015  . ATN (acute tubular necrosis) ([place]) 09/12/2015  . Fecal occult blood test positive 09/12/2015  . Hepatic cirrhosis ([place])   . Abdominal pain   . Status post peripherally inserted  central catheter (PICC) central line placement   . Acute on chronic renal failure ([place]) 09/03/2015  . Normocytic anemia 09/03/2015  . Opioid dependence ([place]) 09/03/2015  . Pulmonary arterial hypertension ([place]) 09/03/2015  . Acute encephalopathy 09/03/2015  . Open leg wound 09/02/2015  . Lymphedema 08/26/2015  . Hypokalemia 08/26/2015  . Hyperuricemia 07/09/2015  . Coronary artery disease 01/22/2015  . Chronic kidney disease (CKD), stage III (moderate) 01/22/2015  . Chronic constipation 01/22/2015  . COPD (chronic obstructive pulmonary disease) ([place]) 01/22/2015  . ED (erectile dysfunction) of organic origin 01/22/2015  . Lymphedema of both lower extremities 01/22/2015  . Fatigue 01/22/2015  . Blood in the urine 01/22/2015  . Calculus of kidney 01/22/2015  . Memory loss 01/22/2015  . Hypertensive pulmonary vascular disease ([place]) 01/22/2015  . Displacement of cervical intervertebral disc without myelopathy 01/22/2015  . Seizures ([place]) 01/17/2015  . Obstructive sleep apnea 01/17/2015  . Hyperlipidemia  01/17/2015  . Leg swelling 01/17/2015  . Degenerative disc disease, lumbar 01/17/2015  . Back pain 11/12/2014  . TI (tricuspid incompetence) 08/02/2014   Andrey Spearman, MS, OTR/L, [facility] 01/06/16 12:33 PM   [place] MAIN [facility] SERVICES [address] [place], [place], [address] Phone: [phone]   Fax:  [phone]  Name: TAUHEED MCFAYDEN MRN: [id number] Date of Birth: [birth year]

## 2016-01-08 ENCOUNTER — Ambulatory Visit: Payer: Medicare Other | Admitting: Occupational Therapy

## 2016-01-08 ENCOUNTER — Encounter: Payer: Medicare Other | Admitting: Occupational Therapy

## 2016-01-08 DIAGNOSIS — I890 Lymphedema, not elsewhere classified: Secondary | ICD-10-CM

## 2016-01-08 NOTE — Patient Instructions (Signed)
LE instructions and precautions as established- see initial eval.   

## 2016-01-08 NOTE — Therapy (Signed)
[place] MAIN [facility] SERVICES [address] [place], [place], [address] Phone: [phone]   Fax:  [phone]  Occupational Therapy Treatment  Patient Details  Name: Vincent Mason MRN: [id number] Date of Birth: January 26, 1936 No Data Recorded  Encounter Date: 01/08/2016      OT End of Session - 01/08/16 1225    Visit Number 12   Number of Visits 36   Date for OT Re-Evaluation 02/26/16   OT Start Time 1110   OT Stop Time 1215   OT Time Calculation (min) 65 min   Activity Tolerance Patient tolerated treatment well;No increased pain   Behavior During Therapy WFL for tasks assessed/performed      Past Medical History:  Diagnosis Date  . Atrial fibrillation and flutter [facility]) 2013   Dr Nehemiah Massed at [facility]  on [place].   Marland Kitchen COPD (chronic obstructive pulmonary disease) ([place])   . Hyperlipidemia   . Hypertension   . Kidney disease, chronic, stage III (moderate, EGFR 30-59 ml/min)    ARF 2013  . OSA (obstructive sleep apnea)    non-compliant with CPAP.     Past Surgical History:  Procedure Laterality Date  . CORONARY ARTERY BYPASS GRAFT  10/1997  . history of cervical discectomy  2009   C5-C6  . Hyperplastic colon polyp  02/2002   Sigmoid polyps    There were no vitals filed for this visit.      Subjective Assessment - 01/08/16 1221    Subjective  Pt presents for OT visit 12 to address BLE lymphedema. Pt is not wearing compression wraps / garment alternative to clinic. Pt is accompanied by his spouse, Vincent Mason. Pt reports he does not want to get recommended CircAid knee length  adjustable compression wraps b/c they cost too much. Pt states he will continue to wrap.   Patient is accompained by: Family member   Pertinent History LE PRECAUTIONS/ CDT CONTRAINDICATIONS: uncontrolled CHF, COPD, AFib, CAD, HCC, Valve incompetence, CKD (stage III),    Limitations leg swelling, leg pain,difficulty walking, decreased endurance and activity tolerance  (SOB during exertion), decreased functional mobiliity and transfers, needs assistance with all basic and instrumental ADLs,  difficulty fitting lowewr body clothing and street clothes, decreased alertness, decreased swallowing ability ( by spouse report),    Patient Stated Goals Decrease swelling and keep it from getting worse   Pain Onset More than a month ago                              OT Education - 01/08/16 1223    Education provided Yes   Education Details Continued Pt and spouse education for vasuclar wrap using ace bandages. Spouse having a tough time learning proper wrap pattern.    Person(s) Educated Patient;Spouse   Methods Explanation;Demonstration;Tactile cues;Verbal cues   Comprehension Verbalized understanding;Returned demonstration;Verbal cues required;Tactile cues required;Need further instruction             OT Long Term Goals - 01/02/16 1241      OT LONG TERM GOAL #1   Title Patient to demonstrate understanding of lymphedema etiology, progression, treatment  plan, and LE precautions to limit infection risk and progression.   Baseline independent   Time 2   Period Weeks   Status Achieved     OT LONG TERM GOAL #2   Title Lymphedema (LE) self-care: Pt able to apply multi layered, gradient compression wraps independently using proper techniques with Max  caregiver assistance to achieve optimal limb volume reduction and to limit LE progression.   Baseline dependent   Time 12   Period Weeks   Status Partially Met     OT LONG TERM GOAL #3   Title Lymphedema (LE) self-care:  Pt to achieve at least 10% LLE limb volume reductions during Intensive CDT to limit LE progression, decrease infection risk, to reduce pain/ discomfort, and to improve safe ambulation and functional mobility.   Baseline dependent   Time 12   Period Weeks   Status Partially Met     OT LONG TERM GOAL #4   Title Lymphedema (LE) self-care:  Pt >/= 85 % compliant with all  daily, LE self-care protocols for home program with Max caregiver assistance, including simple self-manual lymphatic drainage (MLD), skin care, lymphatic pumping the ex, skin care, and donning/ doffing compression wraps and garments o limit LE progression and further functional decline.     Time 12   Period Weeks   Status Partially Met     OT LONG TERM GOAL #5   Title Lymphedema (LE) self-care:  Pt to tolerate daily compression wraps, garments and devices in keeping w/ prescribed wear regime within 1 week of issue date to progress and retain clinical and functional gains and to limit LE progression.   Baseline dependent   Time 12   Period Weeks   Status Partially Met     OT LONG TERM GOAL #6   Title Lymphedema (LE) self-care:  During Management Phase CDT Pt to sustain limb volume reductions achieved during Intensive Phase CDT within 5% utilizing LE self-care protocols, appropriate compression garments/ devices, and needed level of caregiver assistance.   Baseline dependent   Time 6   Period Months   Status On-going               Plan - 01/08/16 1225    Clinical Impression Statement Pt declining recommended adjustable compression garment alternatives due to cost. Reviewed compression wrapping w/ long stretch wraps with spouse after MLD to BLE. Spouse having a tough time learning pattern and required Max assistance to apply with practice x 2 in clinic today. Had long discussion with Pt regarding recommended PT eval to check balance and unsteady gait. Pt concerned that he has done all this before at sub acute rehab. By end of session he agreed to ettend evaluation to explore further with PT.    Rehab Potential Good   OT Frequency 3x / week   OT Duration 12 weeks   OT Treatment/Interventions Self-care/ADL training;DME and/or AE instruction;Manual lymph drainage;Patient/family education;Compression bandaging;Therapeutic exercises;Therapeutic exercise;Therapeutic activities;Energy  conservation;Manual Therapy      Patient will benefit from skilled therapeutic intervention in order to improve the following deficits and impairments:  Decreased knowledge of use of DME, Decreased skin integrity, Increased edema, Cardiopulmonary status limiting activity, Decreased mobility, Other (comment), Decreased activity tolerance, Decreased endurance, Decreased strength, Decreased knowledge of precautions, Difficulty walking, Impaired flexibility, Pain, Impaired sensation, Decreased balance, Abnormal gait  Visit Diagnosis: Lymphedema, not elsewhere classified    Problem List Patient Active Problem List   Diagnosis Date Noted  . Hypomagnesemia 09/14/2015  . Oral thrush 09/14/2015  . Acute respiratory failure with hypoxia ([place]) 09/12/2015  . HCAP (healthcare-associated pneumonia) 09/12/2015  . C. difficile colitis 09/12/2015  . Acute on chronic diastolic CHF (congestive heart failure), NYHA class 1 ([place]) 09/12/2015  . Severe sepsis with septic shock ([place]) 09/12/2015  . Chronic atrial fibrillation ([place]) 09/12/2015  . Essential hypertension  09/12/2015  . ATN (acute tubular necrosis) ([place]) 09/12/2015  . Fecal occult blood test positive 09/12/2015  . Hepatic cirrhosis ([place])   . Abdominal pain   . Status post peripherally inserted central catheter (PICC) central line placement   . Acute on chronic renal failure ([place]) 09/03/2015  . Normocytic anemia 09/03/2015  . Opioid dependence ([place]) 09/03/2015  . Pulmonary arterial hypertension ([place]) 09/03/2015  . Acute encephalopathy 09/03/2015  . Open leg wound 09/02/2015  . Lymphedema 08/26/2015  . Hypokalemia 08/26/2015  . Hyperuricemia 07/09/2015  . Coronary artery disease 01/22/2015  . Chronic kidney disease (CKD), stage III (moderate) 01/22/2015  . Chronic constipation 01/22/2015  . COPD (chronic obstructive pulmonary disease) ([place]) 01/22/2015  . ED (erectile dysfunction) of organic origin 01/22/2015  . Lymphedema of both lower  extremities 01/22/2015  . Fatigue 01/22/2015  . Blood in the urine 01/22/2015  . Calculus of kidney 01/22/2015  . Memory loss 01/22/2015  . Hypertensive pulmonary vascular disease ([place]) 01/22/2015  . Displacement of cervical intervertebral disc without myelopathy 01/22/2015  . Seizures ([place]) 01/17/2015  . Obstructive sleep apnea 01/17/2015  . Hyperlipidemia 01/17/2015  . Leg swelling 01/17/2015  . Degenerative disc disease, lumbar 01/17/2015  . Back pain 11/12/2014  . TI (tricuspid incompetence) 08/02/2014    Andrey Spearman, MS, OTR/L, [facility] 01/08/16 12:30 PM   [place] MAIN [facility] SERVICES [address] [place], [place], [address] Phone: [phone]   Fax:  [phone]  Name: Vincent Mason MRN: [id number] Date of Birth: [birth year]

## 2016-01-10 ENCOUNTER — Encounter: Payer: Medicare Other | Admitting: Occupational Therapy

## 2016-01-10 NOTE — Therapy (Signed)
[place] MAIN [facility] SERVICES [address] [place], [place], [address] Phone: [phone]   Fax:  [phone]  Occupational Therapy Treatment  Patient Details  Name: Vincent Mason MRN: [id number] Date of Birth: 08-03-1935 No Data Recorded  Encounter Date: 12/30/2015    Past Medical History:  Diagnosis Date  . Atrial fibrillation and flutter [facility]) 2013   Dr Nehemiah Massed at [facility]  on [place].   Marland Kitchen COPD (chronic obstructive pulmonary disease) ([place])   . Hyperlipidemia   . Hypertension   . Kidney disease, chronic, stage III (moderate, EGFR 30-59 ml/min)    ARF 2013  . OSA (obstructive sleep apnea)    non-compliant with CPAP.     Past Surgical History:  Procedure Laterality Date  . CORONARY ARTERY BYPASS GRAFT  10/1997  . history of cervical discectomy  2009   C5-C6  . Hyperplastic colon polyp  02/2002   Sigmoid polyps    There were no vitals filed for this visit.                                 OT Long Term Goals - 01/02/16 1241      OT LONG TERM GOAL #1   Title Patient to demonstrate understanding of lymphedema etiology, progression, treatment  plan, and LE precautions to limit infection risk and progression.   Baseline independent   Time 2   Period Weeks   Status Achieved     OT LONG TERM GOAL #2   Title Lymphedema (LE) self-care: Pt able to apply multi layered, gradient compression wraps independently using proper techniques with Max caregiver assistance to achieve optimal limb volume reduction and to limit LE progression.   Baseline dependent   Time 12   Period Weeks   Status Partially Met     OT LONG TERM GOAL #3   Title Lymphedema (LE) self-care:  Pt to achieve at least 10% LLE limb volume reductions during Intensive CDT to limit LE progression, decrease infection risk, to reduce pain/ discomfort, and to improve safe ambulation and functional mobility.   Baseline dependent   Time 12   Period Weeks    Status Partially Met     OT LONG TERM GOAL #4   Title Lymphedema (LE) self-care:  Pt >/= 85 % compliant with all daily, LE self-care protocols for home program with Max caregiver assistance, including simple self-manual lymphatic drainage (MLD), skin care, lymphatic pumping the ex, skin care, and donning/ doffing compression wraps and garments o limit LE progression and further functional decline.     Time 12   Period Weeks   Status Partially Met     OT LONG TERM GOAL #5   Title Lymphedema (LE) self-care:  Pt to tolerate daily compression wraps, garments and devices in keeping w/ prescribed wear regime within 1 week of issue date to progress and retain clinical and functional gains and to limit LE progression.   Baseline dependent   Time 12   Period Weeks   Status Partially Met     OT LONG TERM GOAL #6   Title Lymphedema (LE) self-care:  During Management Phase CDT Pt to sustain limb volume reductions achieved during Intensive Phase CDT within 5% utilizing LE self-care protocols, appropriate compression garments/ devices, and needed level of caregiver assistance.   Baseline dependent   Time 6   Period Months   Status On-going  Patient will benefit from skilled therapeutic intervention in order to improve the following deficits and impairments:  (P) Decreased knowledge of use of DME, Decreased skin integrity, Increased edema, Cardiopulmonary status limiting activity, Decreased mobility, Other (comment), Decreased activity tolerance, Decreased endurance, Decreased strength, Decreased knowledge of precautions, Difficulty walking, Impaired flexibility, Pain, Impaired sensation, Decreased balance, Abnormal gait  Visit Diagnosis: Lymphedema, not elsewhere classified    Problem List Patient Active Problem List   Diagnosis Date Noted  . Hypomagnesemia 09/14/2015  . Oral thrush 09/14/2015  . Acute respiratory failure with hypoxia ([place]) 09/12/2015  . HCAP  (healthcare-associated pneumonia) 09/12/2015  . C. difficile colitis 09/12/2015  . Acute on chronic diastolic CHF (congestive heart failure), NYHA class 1 ([place]) 09/12/2015  . Severe sepsis with septic shock ([place]) 09/12/2015  . Chronic atrial fibrillation ([place]) 09/12/2015  . Essential hypertension 09/12/2015  . ATN (acute tubular necrosis) ([place]) 09/12/2015  . Fecal occult blood test positive 09/12/2015  . Hepatic cirrhosis ([place])   . Abdominal pain   . Status post peripherally inserted central catheter (PICC) central line placement   . Acute on chronic renal failure ([place]) 09/03/2015  . Normocytic anemia 09/03/2015  . Opioid dependence ([place]) 09/03/2015  . Pulmonary arterial hypertension ([place]) 09/03/2015  . Acute encephalopathy 09/03/2015  . Open leg wound 09/02/2015  . Lymphedema 08/26/2015  . Hypokalemia 08/26/2015  . Hyperuricemia 07/09/2015  . Coronary artery disease 01/22/2015  . Chronic kidney disease (CKD), stage III (moderate) 01/22/2015  . Chronic constipation 01/22/2015  . COPD (chronic obstructive pulmonary disease) ([place]) 01/22/2015  . ED (erectile dysfunction) of organic origin 01/22/2015  . Lymphedema of both lower extremities 01/22/2015  . Fatigue 01/22/2015  . Blood in the urine 01/22/2015  . Calculus of kidney 01/22/2015  . Memory loss 01/22/2015  . Hypertensive pulmonary vascular disease ([place]) 01/22/2015  . Displacement of cervical intervertebral disc without myelopathy 01/22/2015  . Seizures ([place]) 01/17/2015  . Obstructive sleep apnea 01/17/2015  . Hyperlipidemia 01/17/2015  . Leg swelling 01/17/2015  . Degenerative disc disease, lumbar 01/17/2015  . Back pain 11/12/2014  . TI (tricuspid incompetence) 08/02/2014    .[place] MAIN [facility] SERVICES [address] [place], [place], [address] Phone: [phone]   Fax:  [phone]  Name: Vincent Mason MRN: [id number] Date of Birth: Feb 10, 1936

## 2016-01-13 ENCOUNTER — Ambulatory Visit: Payer: Medicare Other | Admitting: Occupational Therapy

## 2016-01-13 DIAGNOSIS — I890 Lymphedema, not elsewhere classified: Secondary | ICD-10-CM | POA: Diagnosis not present

## 2016-01-13 NOTE — Patient Instructions (Signed)
LE instructions and precautions as established- see initial eval.   

## 2016-01-13 NOTE — Therapy (Signed)
[place] MAIN [facility] SERVICES [address] [place], [place], [address] Phone: [phone]   Fax:  [phone]  Occupational Therapy Treatment  Patient Details  Name: Vincent Mason MRN: [id number] Date of Birth: 12/02/1935 No Data Recorded  Encounter Date: 01/13/2016      OT End of Session - 01/13/16 1508    Visit Number 13   Number of Visits 36   Date for OT Re-Evaluation 02/26/16   OT Start Time 1115   OT Stop Time 1215   OT Time Calculation (min) 60 min   Activity Tolerance Patient tolerated treatment well;No increased pain   Behavior During Therapy WFL for tasks assessed/performed      Past Medical History:  Diagnosis Date  . Atrial fibrillation and flutter [facility]) 2013   Dr Nehemiah Massed at [facility]  on [place].   Marland Kitchen COPD (chronic obstructive pulmonary disease) ([place])   . Hyperlipidemia   . Hypertension   . Kidney disease, chronic, stage III (moderate, EGFR 30-59 ml/min)    ARF 2013  . OSA (obstructive sleep apnea)    non-compliant with CPAP.     Past Surgical History:  Procedure Laterality Date  . CORONARY ARTERY BYPASS GRAFT  10/1997  . history of cervical discectomy  2009   C5-C6  . Hyperplastic colon polyp  02/2002   Sigmoid polyps    There were no vitals filed for this visit.      Subjective Assessment - 01/13/16 1505    Subjective  Pt presents for OT visit 13 to address BLE lymphedema. Pt is not wearing compression wraps today. "I can only wear them so long and then they start to hurt my legs. " Spouse states she believes redness below the knees is getting lighter.   Patient is accompained by: Family member   Pertinent History LE PRECAUTIONS/ CDT CONTRAINDICATIONS: uncontrolled CHF, COPD, AFib, CAD, HCC, Valve incompetence, CKD (stage III),    Limitations leg swelling, leg pain,difficulty walking, decreased endurance and activity tolerance (SOB during exertion), decreased functional mobiliity and transfers, needs assistance  with all basic and instrumental ADLs,  difficulty fitting lowewr body clothing and street clothes, decreased alertness, decreased swallowing ability ( by spouse report),    Patient Stated Goals Decrease swelling and keep it from getting worse   Currently in Pain? No/denies   Pain Onset More than a month ago                      OT Treatments/Exercises (OP) - 01/13/16 0001      ADLs   ADL Education Given Yes     Manual Therapy   Manual Therapy Edema management;Manual Lymphatic Drainage (MLD)   Edema Management skin care during MLD to RLE   Manual Lymphatic Drainage (MLD) Manual lymph drainage (MLD) to RLE in supine utilizing functional inguinal lymph nodes and deep abdominal lymphatics as is customary for non-cancer related lower extremity LE, including bilateral "short neck" sequence, J strokes to sub and supraclavicular LN, deep abdominal pathways, functional inguinal LN, lower extremity proximal to distal w/ emphasis on medial knee bottleneck and politeal LN. Performed fibrosis technique to B maleoli and distal  legs to address fatty fibrosis. Good tolerance.   Compression Bandaging No compression wrap today. Spouse agrees to reapply PRN.                 OT Education - 01/13/16 1508    Education provided Yes   Education Details Continued skilled Pt/caregiver Education  And LE ADL training throughout visit for lymphedema self care, including compression wrapping, compression garment and device wear/care, lymphatic pumping ther ex, simple self-MLD, and skin care. Discussed progress towards goals.   Person(s) Educated Patient;Spouse   Methods Explanation   Comprehension Verbalized understanding             OT Long Term Goals - 01/02/16 1241      OT LONG TERM GOAL #1   Title Patient to demonstrate understanding of lymphedema etiology, progression, treatment  plan, and LE precautions to limit infection risk and progression.   Baseline independent   Time 2    Period Weeks   Status Achieved     OT LONG TERM GOAL #2   Title Lymphedema (LE) self-care: Pt able to apply multi layered, gradient compression wraps independently using proper techniques with Max caregiver assistance to achieve optimal limb volume reduction and to limit LE progression.   Baseline dependent   Time 12   Period Weeks   Status Partially Met     OT LONG TERM GOAL #3   Title Lymphedema (LE) self-care:  Pt to achieve at least 10% LLE limb volume reductions during Intensive CDT to limit LE progression, decrease infection risk, to reduce pain/ discomfort, and to improve safe ambulation and functional mobility.   Baseline dependent   Time 12   Period Weeks   Status Partially Met     OT LONG TERM GOAL #4   Title Lymphedema (LE) self-care:  Pt >/= 85 % compliant with all daily, LE self-care protocols for home program with Max caregiver assistance, including simple self-manual lymphatic drainage (MLD), skin care, lymphatic pumping the ex, skin care, and donning/ doffing compression wraps and garments o limit LE progression and further functional decline.     Time 12   Period Weeks   Status Partially Met     OT LONG TERM GOAL #5   Title Lymphedema (LE) self-care:  Pt to tolerate daily compression wraps, garments and devices in keeping w/ prescribed wear regime within 1 week of issue date to progress and retain clinical and functional gains and to limit LE progression.   Baseline dependent   Time 12   Period Weeks   Status Partially Met     OT LONG TERM GOAL #6   Title Lymphedema (LE) self-care:  During Management Phase CDT Pt to sustain limb volume reductions achieved during Intensive Phase CDT within 5% utilizing LE self-care protocols, appropriate compression garments/ devices, and needed level of caregiver assistance.   Baseline dependent   Time 6   Period Months   Status On-going               Plan - 01/13/16 1511    Clinical Impression Statement Pt's skin  condition continues to improve with MLD and skin care in clinic. Pt partially compliant w/ compression wraps at this stage. He tolerates p ~ 8 hrs after clinic visit and then removes 2/2 pain and discomfort in leg. Completed quick ankle and knee measurements today to  check saple stoclk for sz 5 Juzo knee highs. I believe he might tolerate the very soft 2002 fabric.   Rehab Potential Good   OT Frequency 3x / week   OT Duration 12 weeks   OT Treatment/Interventions Self-care/ADL training;DME and/or AE instruction;Manual lymph drainage;Patient/family education;Compression bandaging;Therapeutic exercises;Therapeutic exercise;Therapeutic activities;Energy conservation;Manual Therapy      Patient will benefit from skilled therapeutic intervention in order to improve the following deficits and impairments:  Decreased knowledge  of use of DME, Decreased skin integrity, Increased edema, Cardiopulmonary status limiting activity, Decreased mobility, Other (comment), Decreased activity tolerance, Decreased endurance, Decreased strength, Decreased knowledge of precautions, Difficulty walking, Impaired flexibility, Pain, Impaired sensation, Decreased balance, Abnormal gait  Visit Diagnosis: Lymphedema, not elsewhere classified    Problem List Patient Active Problem List   Diagnosis Date Noted  . Hypomagnesemia 09/14/2015  . Oral thrush 09/14/2015  . Acute respiratory failure with hypoxia ([place]) 09/12/2015  . HCAP (healthcare-associated pneumonia) 09/12/2015  . C. difficile colitis 09/12/2015  . Acute on chronic diastolic CHF (congestive heart failure), NYHA class 1 ([place]) 09/12/2015  . Severe sepsis with septic shock ([place]) 09/12/2015  . Chronic atrial fibrillation ([place]) 09/12/2015  . Essential hypertension 09/12/2015  . ATN (acute tubular necrosis) ([place]) 09/12/2015  . Fecal occult blood test positive 09/12/2015  . Hepatic cirrhosis ([place])   . Abdominal pain   . Status post peripherally inserted central  catheter (PICC) central line placement   . Acute on chronic renal failure ([place]) 09/03/2015  . Normocytic anemia 09/03/2015  . Opioid dependence ([place]) 09/03/2015  . Pulmonary arterial hypertension ([place]) 09/03/2015  . Acute encephalopathy 09/03/2015  . Open leg wound 09/02/2015  . Lymphedema 08/26/2015  . Hypokalemia 08/26/2015  . Hyperuricemia 07/09/2015  . Coronary artery disease 01/22/2015  . Chronic kidney disease (CKD), stage III (moderate) 01/22/2015  . Chronic constipation 01/22/2015  . COPD (chronic obstructive pulmonary disease) ([place]) 01/22/2015  . ED (erectile dysfunction) of organic origin 01/22/2015  . Lymphedema of both lower extremities 01/22/2015  . Fatigue 01/22/2015  . Blood in the urine 01/22/2015  . Calculus of kidney 01/22/2015  . Memory loss 01/22/2015  . Hypertensive pulmonary vascular disease ([place]) 01/22/2015  . Displacement of cervical intervertebral disc without myelopathy 01/22/2015  . Seizures ([place]) 01/17/2015  . Obstructive sleep apnea 01/17/2015  . Hyperlipidemia 01/17/2015  . Leg swelling 01/17/2015  . Degenerative disc disease, lumbar 01/17/2015  . Back pain 11/12/2014  . TI (tricuspid incompetence) 08/02/2014   Andrey Spearman, MS, OTR/L, [facility] 01/13/16 4:17 PM  [place] MAIN [facility] SERVICES [address] [place], [place], [address] Phone: [phone]   Fax:  [phone]  Name: Vincent Mason MRN: [id number] Date of Birth: December 27, 1935

## 2016-01-15 ENCOUNTER — Encounter: Payer: Medicare Other | Admitting: Occupational Therapy

## 2016-01-16 ENCOUNTER — Ambulatory Visit: Payer: Medicare Other | Admitting: Occupational Therapy

## 2016-01-16 DIAGNOSIS — I890 Lymphedema, not elsewhere classified: Secondary | ICD-10-CM | POA: Diagnosis not present

## 2016-01-16 NOTE — Therapy (Signed)
[place] MAIN [facility] SERVICES [address] [place], [place], [address] Phone: [phone]   Fax:  [phone]  Occupational Therapy Treatment  Patient Details  Name: Vincent Mason MRN: [id number] Date of Birth: [birth year] No Data Recorded  Encounter Date: 01/16/2016      OT End of Session - 01/16/16 1217    Visit Number 14   Number of Visits 36   Date for OT Re-Evaluation 02/26/16   OT Start Time 1100   OT Stop Time 1200   OT Time Calculation (min) 60 min   Activity Tolerance Patient tolerated treatment well;No increased pain   Behavior During Therapy WFL for tasks assessed/performed      Past Medical History:  Diagnosis Date  . Atrial fibrillation and flutter [facility]) 2013   Dr Nehemiah Massed at [facility]  on [address].   Marland Kitchen COPD (chronic obstructive pulmonary disease) ([place])   . Hyperlipidemia   . Hypertension   . Kidney disease, chronic, stage III (moderate, EGFR 30-59 ml/min)    ARF 2013  . OSA (obstructive sleep apnea)    non-compliant with CPAP.     Past Surgical History:  Procedure Laterality Date  . CORONARY ARTERY BYPASS GRAFT  10/1997  . history of cervical discectomy  2009   C5-C6  . Hyperplastic colon polyp  02/2002   Sigmoid polyps    There were no vitals filed for this visit.      Subjective Assessment - 01/16/16 1216    Subjective  Pt presents for OT visit 14 to address BLE lymphedema. Pt is not wearing compression wraps today. Pt has no new complaints today. Pt is accompanied by his wife, who requests cancellation for upcoming apt for personal reasons.   Patient is accompained by: Family member   Pertinent History LE PRECAUTIONS/ CDT CONTRAINDICATIONS: uncontrolled CHF, COPD, AFib, CAD, HCC, Valve incompetence, CKD (stage III),    Limitations leg swelling, leg pain,difficulty walking, decreased endurance and activity tolerance (SOB during exertion), decreased functional mobiliity and transfers, needs assistance with all  basic and instrumental ADLs,  difficulty fitting lowewr body clothing and street clothes, decreased alertness, decreased swallowing ability ( by spouse report),    Patient Stated Goals Decrease swelling and keep it from getting worse   Currently in Pain? No/denies   Pain Onset More than a month ago                              OT Education - 01/16/16 1216    Education provided Yes   Education Details compression garment resources, options and recommendations   Person(s) Educated Patient;Spouse   Methods Explanation;Demonstration   Comprehension Verbalized understanding;Need further instruction             OT Long Term Goals - 01/02/16 1241      OT LONG TERM GOAL #1   Title Patient to demonstrate understanding of lymphedema etiology, progression, treatment  plan, and LE precautions to limit infection risk and progression.   Baseline independent   Time 2   Period Weeks   Status Achieved     OT LONG TERM GOAL #2   Title Lymphedema (LE) self-care: Pt able to apply multi layered, gradient compression wraps independently using proper techniques with Max caregiver assistance to achieve optimal limb volume reduction and to limit LE progression.   Baseline dependent   Time 12   Period Weeks   Status Partially Met  OT LONG TERM GOAL #3   Title Lymphedema (LE) self-care:  Pt to achieve at least 10% LLE limb volume reductions during Intensive CDT to limit LE progression, decrease infection risk, to reduce pain/ discomfort, and to improve safe ambulation and functional mobility.   Baseline dependent   Time 12   Period Weeks   Status Partially Met     OT LONG TERM GOAL #4   Title Lymphedema (LE) self-care:  Pt >/= 85 % compliant with all daily, LE self-care protocols for home program with Max caregiver assistance, including simple self-manual lymphatic drainage (MLD), skin care, lymphatic pumping the ex, skin care, and donning/ doffing compression wraps and  garments o limit LE progression and further functional decline.     Time 12   Period Weeks   Status Partially Met     OT LONG TERM GOAL #5   Title Lymphedema (LE) self-care:  Pt to tolerate daily compression wraps, garments and devices in keeping w/ prescribed wear regime within 1 week of issue date to progress and retain clinical and functional gains and to limit LE progression.   Baseline dependent   Time 12   Period Weeks   Status Partially Met     OT LONG TERM GOAL #6   Title Lymphedema (LE) self-care:  During Management Phase CDT Pt to sustain limb volume reductions achieved during Intensive Phase CDT within 5% utilizing LE self-care protocols, appropriate compression garments/ devices, and needed level of caregiver assistance.   Baseline dependent   Time 6   Period Months   Status On-going               Plan - 01/16/16 1217    Clinical Impression Statement Pt encouraged to utilize OTS compression knee highs since he is resistant to purchasing any other form of compression at this time. Legs more swollen than usual today and Pt not feeling his best. Suspect systemic fluid retention is increased today as evidenced by notable improvement in leg swelling after elevation and MLD. Will consider decreasing treatment frequency is Pt purchased OTS compression knee highs before next visit. Spouse agrees w/ plan. Resources provided for [place]st class 2 athletic style knee highs.   Rehab Potential Good   OT Frequency 3x / week   OT Duration 12 weeks   OT Treatment/Interventions Self-care/ADL training;DME and/or AE instruction;Manual lymph drainage;Patient/family education;Compression bandaging;Therapeutic exercises;Therapeutic exercise;Therapeutic activities;Energy conservation;Manual Therapy      Patient will benefit from skilled therapeutic intervention in order to improve the following deficits and impairments:  Decreased knowledge of use of DME, Decreased skin integrity, Increased  edema, Cardiopulmonary status limiting activity, Decreased mobility, Other (comment), Decreased activity tolerance, Decreased endurance, Decreased strength, Decreased knowledge of precautions, Difficulty walking, Impaired flexibility, Pain, Impaired sensation, Decreased balance, Abnormal gait  Visit Diagnosis: Lymphedema, not elsewhere classified    Problem List Patient Active Problem List   Diagnosis Date Noted  . Hypomagnesemia 09/14/2015  . Oral thrush 09/14/2015  . Acute respiratory failure with hypoxia ([place]) 09/12/2015  . HCAP (healthcare-associated pneumonia) 09/12/2015  . C. difficile colitis 09/12/2015  . Acute on chronic diastolic CHF (congestive heart failure), NYHA class 1 ([place]) 09/12/2015  . Severe sepsis with septic shock ([place]) 09/12/2015  . Chronic atrial fibrillation ([place]) 09/12/2015  . Essential hypertension 09/12/2015  . ATN (acute tubular necrosis) ([place]) 09/12/2015  . Fecal occult blood test positive 09/12/2015  . Hepatic cirrhosis ([place])   . Abdominal pain   . Status post peripherally inserted central catheter (PICC) central  line placement   . Acute on chronic renal failure ([place]) 09/03/2015  . Normocytic anemia 09/03/2015  . Opioid dependence ([place]) 09/03/2015  . Pulmonary arterial hypertension ([place]) 09/03/2015  . Acute encephalopathy 09/03/2015  . Open leg wound 09/02/2015  . Lymphedema 08/26/2015  . Hypokalemia 08/26/2015  . Hyperuricemia 07/09/2015  . Coronary artery disease 01/22/2015  . Chronic kidney disease (CKD), stage III (moderate) 01/22/2015  . Chronic constipation 01/22/2015  . COPD (chronic obstructive pulmonary disease) ([place]) 01/22/2015  . ED (erectile dysfunction) of organic origin 01/22/2015  . Lymphedema of both lower extremities 01/22/2015  . Fatigue 01/22/2015  . Blood in the urine 01/22/2015  . Calculus of kidney 01/22/2015  . Memory loss 01/22/2015  . Hypertensive pulmonary vascular disease ([place]) 01/22/2015  . Displacement of cervical  intervertebral disc without myelopathy 01/22/2015  . Seizures ([place]) 01/17/2015  . Obstructive sleep apnea 01/17/2015  . Hyperlipidemia 01/17/2015  . Leg swelling 01/17/2015  . Degenerative disc disease, lumbar 01/17/2015  . Back pain 11/12/2014  . TI (tricuspid incompetence) 08/02/2014    Andrey Spearman, MS, OTR/L, [facility] 01/16/16 12:21 PM   [place] MAIN [facility] SERVICES [address] [place], [place], [address] Phone: [phone]   Fax:  [phone]  Name: Vincent Mason MRN: [id number] Date of Birth: 1936/02/24

## 2016-01-17 ENCOUNTER — Encounter: Payer: Medicare Other | Admitting: Occupational Therapy

## 2016-01-20 ENCOUNTER — Encounter: Payer: Medicare Other | Admitting: Occupational Therapy

## 2016-01-22 ENCOUNTER — Encounter: Payer: Medicare Other | Admitting: Occupational Therapy

## 2016-01-22 ENCOUNTER — Ambulatory Visit (INDEPENDENT_AMBULATORY_CARE_PROVIDER_SITE_OTHER): Payer: Medicare Other | Admitting: Gastroenterology

## 2016-01-22 ENCOUNTER — Encounter: Payer: Self-pay | Admitting: Gastroenterology

## 2016-01-22 VITALS — BP 129/66 | HR 86 | Temp 97.9°F | Ht 71.0 in | Wt 181.0 lb

## 2016-01-22 DIAGNOSIS — R933 Abnormal findings on diagnostic imaging of other parts of digestive tract: Secondary | ICD-10-CM | POA: Diagnosis not present

## 2016-01-22 NOTE — Progress Notes (Signed)
Primary Care Physician: Lelon Huh, MD  Primary Gastroenterologist:  Dr. Lucilla Lame  Chief Complaint  Patient presents with  . Follow-up    [place] results    HPI: Vincent Mason is a 80 y.o. male here  for follow-up of possible cirrhosis on a ultrasound.  The patient's fibrosis scan showed him to have F2 with some F3. The patient's liver enzymes had returned to normal levels previously.  Current Outpatient Prescriptions  Medication Sig Dispense Refill  . allopurinol (ZYLOPRIM) 100 MG tablet Reported on 12/12/2015    . benazepril (LOTENSIN) 20 MG tablet Take 1 tablet by mouth daily. Reported on 12/12/2015    . colchicine 0.6 MG tablet TAKE 1 TABLET BY MOUTH DAILY AS NEEDED FOR GOUT 30 tablet 5  . DIGOX 125 MCG tablet     . Digoxin 62.5 MCG TABS Take 0.0625 mg by mouth daily.    . famotidine (PEPCID) 20 MG tablet Take 1 tablet (20 mg total) by mouth daily.    . Febuxostat (ULORIC) 80 MG TABS Take 1 tablet (80 mg total) by mouth daily. 30 tablet 3  . gabapentin (NEURONTIN) 100 MG capsule Take 1 capsule (100 mg total) by mouth 3 (three) times daily.    . hydrocortisone-pramoxine (PROCTOFOAM-HC) rectal foam Place 1 applicator rectally 2 (two) times daily. 10 g 0  . ipratropium-albuterol (DUONEB) 0.5-2.5 (3) MG/3ML SOLN Take 3 mLs by nebulization every 6 (six) hours as needed. 360 mL   . lidocaine (XYLOCAINE) 2 % solution Use as directed 15 mLs in the mouth or throat every 3 (three) hours as needed for mouth pain. 100 mL 0  . Maltodextrin-Xanthan Gum (RESOURCE THICKENUP CLEAR) POWD Nectar thicken consistency    . metolazone (ZAROXOLYN) 5 MG tablet Take 1-2 tablets (5-10 mg total) by mouth daily as needed (swelling). 60 tablet 5  . metoprolol succinate (TOPROL-XL) 100 MG 24 hr tablet Take 100 mg by mouth daily. Take with or immediately following a meal.    . OXcarbazepine (TRILEPTAL) 150 MG tablet Take 1 tablet (150 mg total) by mouth 2 (two) times daily. Prescribed by Dr. Melrose Nakayama 1 tablet 1  .  oxyCODONE (ROXICODONE) 15 MG immediate release tablet Take 1 tablet (15 mg total) by mouth every 6 (six) hours as needed for pain. 120 tablet 0  . potassium chloride (K-DUR) 10 MEQ tablet     . potassium chloride SA (K-DUR,KLOR-CON) 20 MEQ tablet Take 2 tablets (40 mEq total) by mouth 2 (two) times daily. 120 tablet 6  . ranitidine (ZANTAC) 150 MG tablet Take 1 tablet by mouth 2 (two) times daily as needed. Reported on 12/12/2015    . rOPINIRole (REQUIP) 4 MG tablet TAKE ONE TABLET BY MOUTH EVERY NIGHT AT BEDTIME FOR RESTLESS LEG SYNDROME 30 tablet 11  . rosuvastatin (CRESTOR) 40 MG tablet TAKE ONE (1) TABLET EACH DAY *GENERIC CRESTOR 30 tablet 12  . sildenafil (REVATIO) 20 MG tablet Take 20 mg by mouth 3 (three) times daily.    Marland Kitchen torsemide (DEMADEX) 20 MG tablet 2 tablets once or twice a day for swelling (Patient taking differently: Take 40 mg by mouth 2 (two) times daily. ) 1 tablet 1  . XARELTO 15 MG TABS tablet     . zolpidem (AMBIEN) 5 MG tablet Take 1 tablet (5 mg total) by mouth at bedtime. Reported on 07/08/2015 30 tablet 0   No current facility-administered medications for this visit.     Allergies as of 01/22/2016 - Review Complete 01/22/2016  Allergen Reaction Noted  . Decongestant  [oxymetazoline]  01/22/2015    ROS:  General: Negative for anorexia, weight loss, fever, chills, fatigue, weakness. ENT: Negative for hoarseness, difficulty swallowing , nasal congestion. CV: Negative for chest pain, angina, palpitations, dyspnea on exertion, peripheral edema.  Respiratory: Negative for dyspnea at rest, dyspnea on exertion, cough, sputum, wheezing.  GI: See history of present illness. GU:  Negative for dysuria, hematuria, urinary incontinence, urinary frequency, nocturnal urination.  Endo: Negative for unusual weight change.    Physical Examination:   BP 129/66 (BP Location: Right Arm, Patient Position: Sitting, Cuff Size: Normal)   Pulse 86   Temp 97.9 F (36.6 C) (Oral)   Ht  5\' 11"  (1.803 m)   Wt 181 lb (82.1 kg)   BMI 25.24 kg/m   General: Well-nourished, well-developed in no acute distress.  Eyes: No icterus. Conjunctivae pink. Neuro: Alert and oriented x 3.  Grossly intact. Skin: Warm and dry, no jaundice.   Psych: Alert and cooperative, normal mood and affect.  Labs:    Imaging Studies: No results found.  Assessment and Plan:   Vincent Mason is a 80 y.o. y/o male Who comes in today for follow-up of an abnormal ultrasound.  The patient had a CT scan and a fibrosis scan that both did not show any cirrhosis.  The patient's platelets are normal and it is unlikely that this patient has cirrhosis.  The patient has been told that he should have his liver enzymes checked every 6 months.   He has also been told that if they become high again then he may need a workup for his abnormal liver enzymes. The patient has been explained the plan and agrees with it   Note: This dictation was prepared with Dragon dictation along with smaller phrase technology. Any transcriptional errors that result from this process are unintentional.

## 2016-01-23 ENCOUNTER — Ambulatory Visit: Payer: Medicare Other | Admitting: Occupational Therapy

## 2016-01-23 ENCOUNTER — Other Ambulatory Visit: Payer: Self-pay | Admitting: Family Medicine

## 2016-01-23 DIAGNOSIS — M549 Dorsalgia, unspecified: Secondary | ICD-10-CM

## 2016-01-23 NOTE — Telephone Encounter (Signed)
Pt needs refill on his oxycodone.  Please call when ready for pick up.  Thanks, [employer]

## 2016-01-24 ENCOUNTER — Telehealth: Payer: Self-pay | Admitting: Family Medicine

## 2016-01-24 ENCOUNTER — Encounter: Payer: Medicare Other | Admitting: Occupational Therapy

## 2016-01-24 MED ORDER — OXYCODONE HCL 15 MG PO TABS: 15.0000 mg | ORAL_TABLET | Freq: Four times a day (QID) | ORAL | 0 refills | Status: DC | PRN

## 2016-01-24 NOTE — Telephone Encounter (Signed)
Called pt to let him know rx he requested is ready. Everitt Amber notified pt.

## 2016-01-24 NOTE — Telephone Encounter (Signed)
Pt is returning call.  [id number]

## 2016-01-28 ENCOUNTER — Ambulatory Visit: Payer: Medicare Other | Attending: Family Medicine | Admitting: Occupational Therapy

## 2016-01-28 DIAGNOSIS — I890 Lymphedema, not elsewhere classified: Secondary | ICD-10-CM | POA: Insufficient documentation

## 2016-01-28 NOTE — Therapy (Signed)
[place] MAIN [facility] SERVICES [address] [place], [place], [address] Phone: [phone]   Fax:  [phone]  Occupational Therapy Treatment  Patient Details  Name: Vincent Mason MRN: [id number] Date of Birth: 19-Nov-1935 No Data Recorded  Encounter Date: 01/28/2016      OT End of Session - 01/28/16 1429    Visit Number 15   Number of Visits 36   Date for OT Re-Evaluation 02/26/16   OT Start Time 1110   OT Stop Time 1200   OT Time Calculation (min) 50 min      Past Medical History:  Diagnosis Date  . Atrial fibrillation and flutter [facility]) 2013   Dr Nehemiah Massed at [facility]  on [address].   Marland Kitchen COPD (chronic obstructive pulmonary disease) ([place])   . Hyperlipidemia   . Hypertension   . Kidney disease, chronic, stage III (moderate, EGFR 30-59 ml/min)    ARF 2013  . OSA (obstructive sleep apnea)    non-compliant with CPAP.     Past Surgical History:  Procedure Laterality Date  . CORONARY ARTERY BYPASS GRAFT  10/1997  . history of cervical discectomy  2009   C5-C6  . Hyperplastic colon polyp  02/2002   Sigmoid polyps    There were no vitals filed for this visit.      Subjective Assessment - 01/28/16 1419    Subjective  Pt presents for OT visit 15 to address BLE lymphedema. Pt brings compression knee highs rated from 10-15 mmHg to session as instructed.   Patient is accompained by: Family member   Pertinent History LE PRECAUTIONS/ CDT CONTRAINDICATIONS: uncontrolled CHF, COPD, AFib, CAD, HCC, Valve incompetence, CKD (stage III),    Limitations leg swelling, leg pain,difficulty walking, decreased endurance and activity tolerance (SOB during exertion), decreased functional mobiliity and transfers, needs assistance with all basic and instrumental ADLs,  difficulty fitting lowewr body clothing and street clothes, decreased alertness, decreased swallowing ability ( by spouse report),    Patient Stated Goals Decrease swelling and keep it from  getting worse   Currently in Pain? No/denies                      OT Treatments/Exercises (OP) - 01/28/16 0001      ADLs   ADL Education Given Yes     Manual Therapy   Manual Therapy Edema management;Other (comment)  compression garment fitting   Compression Bandaging BLE "Dr Comfort" compression knee highs sz L 10-15 mmHg                OT Education - 01/28/16 1426    Education provided Yes   Education Details Emphasis of visit on compression garment fitting and skilled edu for donning and doffing using assistive devices and specialized techniques. By end of spouse able to don garments using friction gloves, with SBA and VC, and extra time.   Person(s) Educated Patient;Spouse   Methods Explanation;Demonstration;Tactile cues;Verbal cues   Comprehension Verbalized understanding;Returned demonstration;Verbal cues required;Tactile cues required             OT Long Term Goals - 01/02/16 1241      OT LONG TERM GOAL #1   Title Patient to demonstrate understanding of lymphedema etiology, progression, treatment  plan, and LE precautions to limit infection risk and progression.   Baseline independent   Time 2   Period Weeks   Status Achieved     OT LONG TERM GOAL #2   Title Lymphedema (LE)  self-care: Pt able to apply multi layered, gradient compression wraps independently using proper techniques with Max caregiver assistance to achieve optimal limb volume reduction and to limit LE progression.   Baseline dependent   Time 12   Period Weeks   Status Partially Met     OT LONG TERM GOAL #3   Title Lymphedema (LE) self-care:  Pt to achieve at least 10% LLE limb volume reductions during Intensive CDT to limit LE progression, decrease infection risk, to reduce pain/ discomfort, and to improve safe ambulation and functional mobility.   Baseline dependent   Time 12   Period Weeks   Status Partially Met     OT LONG TERM GOAL #4   Title Lymphedema (LE)  self-care:  Pt >/= 85 % compliant with all daily, LE self-care protocols for home program with Max caregiver assistance, including simple self-manual lymphatic drainage (MLD), skin care, lymphatic pumping the ex, skin care, and donning/ doffing compression wraps and garments o limit LE progression and further functional decline.     Time 12   Period Weeks   Status Partially Met     OT LONG TERM GOAL #5   Title Lymphedema (LE) self-care:  Pt to tolerate daily compression wraps, garments and devices in keeping w/ prescribed wear regime within 1 week of issue date to progress and retain clinical and functional gains and to limit LE progression.   Baseline dependent   Time 12   Period Weeks   Status Partially Met     OT LONG TERM GOAL #6   Title Lymphedema (LE) self-care:  During Management Phase CDT Pt to sustain limb volume reductions achieved during Intensive Phase CDT within 5% utilizing LE self-care protocols, appropriate compression garments/ devices, and needed level of caregiver assistance.   Baseline dependent   Time 6   Period Months   Status On-going               Plan - 01/28/16 1430    Clinical Impression Statement Emphasis of session on fitting compression knee highs and teaching donning/ doffing techniques using assistive devices. Swelling continues to fluctuate , but essentially has reached clinical plateau. Pt compliant w/ compression wraps for a day typically after OT session, then does not reapply. Skin is well hydrated, but remains shiney and waxy with darrm hemociderine stain bilaterally below the knee. Pt is in agreement  with plan to return for follow up after wearing compression garments daily for ~ 3-4 weeks. Pt will call PRN and agrees to attend PT screening for unsteady gait on same day as OT F/U in effort to prevent falls and further  functional decline.   Rehab Potential Good   OT Frequency 3x / week   OT Duration 12 weeks   OT Treatment/Interventions  Self-care/ADL training;DME and/or AE instruction;Manual lymph drainage;Patient/family education;Compression bandaging;Therapeutic exercises;Therapeutic exercise;Therapeutic activities;Energy conservation;Manual Therapy      Patient will benefit from skilled therapeutic intervention in order to improve the following deficits and impairments:  Decreased knowledge of use of DME, Decreased skin integrity, Increased edema, Cardiopulmonary status limiting activity, Decreased mobility, Other (comment), Decreased activity tolerance, Decreased endurance, Decreased strength, Decreased knowledge of precautions, Difficulty walking, Impaired flexibility, Pain, Impaired sensation, Decreased balance, Abnormal gait  Visit Diagnosis: Lymphedema of both lower extremities    Problem List Patient Active Problem List   Diagnosis Date Noted  . Hypomagnesemia 09/14/2015  . Oral thrush 09/14/2015  . Acute respiratory failure with hypoxia ([place]) 09/12/2015  . HCAP (healthcare-associated pneumonia) 09/12/2015  .  C. difficile colitis 09/12/2015  . Acute on chronic diastolic CHF (congestive heart failure), NYHA class 1 ([place]) 09/12/2015  . Severe sepsis with septic shock ([place]) 09/12/2015  . Chronic atrial fibrillation ([place]) 09/12/2015  . Essential hypertension 09/12/2015  . ATN (acute tubular necrosis) ([place]) 09/12/2015  . Fecal occult blood test positive 09/12/2015  . Hepatic cirrhosis ([place])   . Abdominal pain   . Status post peripherally inserted central catheter (PICC) central line placement   . Acute on chronic renal failure ([place]) 09/03/2015  . Normocytic anemia 09/03/2015  . Opioid dependence ([place]) 09/03/2015  . Pulmonary arterial hypertension ([place]) 09/03/2015  . Acute encephalopathy 09/03/2015  . Open leg wound 09/02/2015  . Lymphedema 08/26/2015  . Hypokalemia 08/26/2015  . Hyperuricemia 07/09/2015  . Coronary artery disease 01/22/2015  . Chronic kidney disease (CKD), stage III (moderate) 01/22/2015   . Chronic constipation 01/22/2015  . COPD (chronic obstructive pulmonary disease) ([place]) 01/22/2015  . ED (erectile dysfunction) of organic origin 01/22/2015  . Lymphedema of both lower extremities 01/22/2015  . Fatigue 01/22/2015  . Blood in the urine 01/22/2015  . Calculus of kidney 01/22/2015  . Memory loss 01/22/2015  . Hypertensive pulmonary vascular disease ([place]) 01/22/2015  . Displacement of cervical intervertebral disc without myelopathy 01/22/2015  . Seizures ([place]) 01/17/2015  . Obstructive sleep apnea 01/17/2015  . Hyperlipidemia 01/17/2015  . Leg swelling 01/17/2015  . Degenerative disc disease, lumbar 01/17/2015  . Back pain 11/12/2014  . TI (tricuspid incompetence) 08/02/2014    Andrey Spearman, MS, OTR/L, [facility] 01/28/16 2:36 PM  [place] MAIN [facility] SERVICES [address] [place], [place], [address] Phone: [phone]   Fax:  [phone]  Name: Vincent Mason MRN: [id number] Date of Birth: 04-Sep-1935

## 2016-01-28 NOTE — Patient Instructions (Signed)
LE instructions and precautions as established- see initial eval.   

## 2016-01-29 ENCOUNTER — Encounter: Payer: Medicare Other | Admitting: Occupational Therapy

## 2016-01-30 ENCOUNTER — Ambulatory Visit: Payer: Medicare Other | Admitting: Occupational Therapy

## 2016-01-31 ENCOUNTER — Encounter: Payer: Medicare Other | Admitting: Occupational Therapy

## 2016-02-03 ENCOUNTER — Ambulatory Visit: Payer: Medicare Other | Admitting: Occupational Therapy

## 2016-02-03 ENCOUNTER — Other Ambulatory Visit: Payer: Self-pay | Admitting: Family Medicine

## 2016-02-03 MED ORDER — SILDENAFIL CITRATE 20 MG PO TABS: 20.0000 mg | ORAL_TABLET | Freq: Three times a day (TID) | ORAL | 3 refills | Status: DC

## 2016-02-03 NOTE — Telephone Encounter (Signed)
Pt contacted office for refill request on the following medications:  sildenafil (REVATIO) 20 MG tablet.  Pt ask for Viagra.  Vincent Mason.  [id number]

## 2016-02-05 ENCOUNTER — Encounter: Payer: Medicare Other | Admitting: Occupational Therapy

## 2016-02-06 ENCOUNTER — Encounter: Payer: Medicare Other | Admitting: Occupational Therapy

## 2016-02-07 ENCOUNTER — Encounter: Payer: Medicare Other | Admitting: Occupational Therapy

## 2016-02-10 ENCOUNTER — Encounter: Payer: Medicare Other | Admitting: Occupational Therapy

## 2016-02-12 ENCOUNTER — Encounter: Payer: Medicare Other | Admitting: Occupational Therapy

## 2016-02-13 ENCOUNTER — Encounter: Payer: Medicare Other | Admitting: Occupational Therapy

## 2016-02-14 ENCOUNTER — Encounter: Payer: Medicare Other | Admitting: Occupational Therapy

## 2016-02-17 ENCOUNTER — Ambulatory Visit: Payer: Medicare Other

## 2016-02-17 ENCOUNTER — Telehealth: Payer: Self-pay

## 2016-02-17 ENCOUNTER — Ambulatory Visit: Payer: Medicare Other | Admitting: Occupational Therapy

## 2016-02-17 NOTE — Telephone Encounter (Signed)
Patient called stating he was prescribed Ropinirole 4mg  at bedtime for restless legs. He says this was not helping with the pain, so he started taking 2 pills during the day and one at bedtime. This has significantly helped improve pain. He has not had any side effects since he increased the medication. Patient wants to know if it is ok to take 3 pills a day?  Patient call back [phone]. Patient says it is ok to leave a detailed message if he doesn't answer the phone.

## 2016-02-17 NOTE — Telephone Encounter (Signed)
This is a really high dose of this medication because even at a Parkinson's indication the max dose is only 8 mg where he is taking 12. However due to his seizure history this medication has to be titrated down. I do not know how long he has been taking this dose. It could be possible that coming down from such a high-dose could trigger seizures. I would recommend for him to continue taking the 3 tablets daily for now until Dr. Caryn Section gets back next week and then this needs to be addressed with Dr. Caryn Section. Also be able to call Dr. Melrose Nakayama in the meantime to have this medication adjusted or to see if maybe he needs to see neurology to evaluate the leg pain.

## 2016-02-17 NOTE — Telephone Encounter (Signed)
Patient advised. Patient wants to wait until Dr. Caryn Section returns, to see what he recommends. Patient states the higher dose is helping with his pain, and he is not having any side effects.Patient says, if he has to decrease  this  medication, then he wants to be switched to something else because the one pill at night didn't help with his pain. Patient is aware that Dr. Caryn Section will be out of the office until 02/24/2016.

## 2016-02-19 ENCOUNTER — Encounter: Payer: Medicare Other | Admitting: Occupational Therapy

## 2016-02-19 ENCOUNTER — Other Ambulatory Visit: Payer: Self-pay | Admitting: Family Medicine

## 2016-02-19 ENCOUNTER — Ambulatory Visit: Payer: Medicare Other | Admitting: Occupational Therapy

## 2016-02-19 MED ORDER — PRAMIPEXOLE DIHYDROCHLORIDE 0.25 MG PO TABS: ORAL_TABLET | ORAL | 0 refills | Status: DC

## 2016-02-19 NOTE — Telephone Encounter (Signed)
Called and informed pharmacy to cancel allopurinol.

## 2016-02-19 NOTE — Progress Notes (Signed)
Pharmacy notified. Also notified pt.

## 2016-02-19 NOTE — Telephone Encounter (Signed)
Please advise patient and the pharmacy that Uloric was supposed to REPLACE allopurinol. It looks like pharmacy has been dispensing both. Need refill called in for Uloric 80 and cancel refills for allopurinol. Thanks.

## 2016-02-19 NOTE — Progress Notes (Signed)
Can change to Mirapex.Please call into [facility] and cancel Requip refills. Have patient call for refill Mirapex if working when prescription runs out.

## 2016-02-20 ENCOUNTER — Encounter: Payer: Medicare Other | Admitting: Occupational Therapy

## 2016-02-20 ENCOUNTER — Ambulatory Visit: Payer: Medicare Other

## 2016-02-21 ENCOUNTER — Encounter: Payer: Medicare Other | Admitting: Occupational Therapy

## 2016-02-21 ENCOUNTER — Ambulatory Visit: Payer: Medicare Other | Admitting: Occupational Therapy

## 2016-02-24 ENCOUNTER — Encounter: Payer: Medicare Other | Admitting: Physical Therapy

## 2016-02-24 ENCOUNTER — Ambulatory Visit: Payer: Medicare Other | Admitting: Occupational Therapy

## 2016-02-26 ENCOUNTER — Ambulatory Visit: Payer: Medicare Other | Attending: Family Medicine | Admitting: Occupational Therapy

## 2016-02-26 DIAGNOSIS — I890 Lymphedema, not elsewhere classified: Secondary | ICD-10-CM | POA: Diagnosis not present

## 2016-02-26 NOTE — Therapy (Signed)
Vincent Mason MAIN [facility] SERVICES [address] [place], [place], [address] Phone: [phone]   Fax:  [phone]  Occupational Therapy Treatment Note & Discharge Summary  Patient Details  Name: Vincent Mason MRN: [id number] Date of Birth: 1936/02/06 No Data Recorded  Encounter Date: 02/26/2016      OT End of Session - 02/26/16 1750    Visit Number 16   Number of Visits 36   Date for OT Re-Evaluation 02/26/16   OT Start Time 0800   OT Stop Time 0834   OT Time Calculation (min) 34 min      Past Medical History:  Diagnosis Date  . Atrial fibrillation and flutter [facility]) 2013   Dr Nehemiah Massed at [facility]  on [address].   Marland Kitchen COPD (chronic obstructive pulmonary disease) ([place])   . Hyperlipidemia   . Hypertension   . Kidney disease, chronic, stage III (moderate, EGFR 30-59 ml/min)    ARF 2013  . OSA (obstructive sleep apnea)    non-compliant with CPAP.     Past Surgical History:  Procedure Laterality Date  . CORONARY ARTERY BYPASS GRAFT  10/1997  . history of cervical discectomy  2009   C5-C6  . Hyperplastic colon polyp  02/2002   Sigmoid polyps    There were no vitals filed for this visit.      Subjective Assessment - 02/26/16 0841    Subjective  Pt presents for OT visit 16 to address BLE lymphedema. This is follow up visit to check progress towards long term CDT goals and to assess readiness for discharge.   Patient is accompained by: Family member   Pertinent History LE PRECAUTIONS/ CDT CONTRAINDICATIONS: uncontrolled CHF, COPD, AFib, CAD, HCC, Valve incompetence, CKD (stage III),    Limitations leg swelling, leg pain,difficulty walking, decreased endurance and activity tolerance (SOB during exertion), decreased functional mobiliity and transfers, needs assistance with all basic and instrumental ADLs,  difficulty fitting lowewr body clothing and street clothes, decreased alertness, decreased swallowing ability ( by spouse report),    Patient  Stated Goals Decrease swelling and keep it from getting worse   Currently in Pain? No/denies   Pain Onset More than a month ago             LYMPHEDEMA/ONCOLOGY QUESTIONNAIRE - 02/26/16 1737      Right Lower Extremity Lymphedema   Other RLE A-D ( ankle to below knee) volume =3096.81 ml;  RLE A-D limb volume reduction since 12/13/15 measures 20.36%.      Other RLE A-G (ankle to groin )= 7986.24 ml; RLE A-G limb volume reduction since 12/13/15 measures 12.93%.    Other A-D limb volume differential = 10.09%, R>L, and A-G LVD = 14.10%, R>L.     Left Lower Extremity Lymphedema   Other LLE A-D ( ankle to below knee) volume =2784.14 ml.  LLE A-D limb volume reduction measures 19.28% overall.   Other LLE A-G (ankle to groin )= 6859.94  ml. LLE A-G limb volume reduction measures 17.9% overall.                 OT Treatments/Exercises (OP) - 02/26/16 0001      ADLs   ADL Education Given Yes     Manual Therapy   Manual Therapy Edema management   Manual therapy comments ble comparative limb volumetrics                OT Education - 02/26/16 1748    Education provided Yes  Education Details Reviewed all LE self care protocols, including simple self MLD, skin care, compression, and ther ex. Pt has handouts for all and spouse is supportive with all.   Person(s) Educated Patient;Spouse   Methods Explanation   Comprehension Verbalized understanding             OT Long Term Goals - 02/26/16 1759      OT LONG TERM GOAL #1   Title Patient to demonstrate understanding of lymphedema etiology, progression, treatment  plan, and LE precautions to limit infection risk and progression.   Baseline independent   Time 2   Period Weeks   Status Achieved     OT LONG TERM GOAL #2   Title Lymphedema (LE) self-care: Pt able to apply multi layered, gradient compression wraps independently using proper techniques with Max caregiver assistance to achieve optimal limb volume  reduction and to limit LE progression.   Baseline dependent   Time 12   Period Weeks   Status Partially Met     OT LONG TERM GOAL #3   Title Lymphedema (LE) self-care:  Pt to achieve at least 10% LLE limb volume reductions during Intensive CDT to limit LE progression, decrease infection risk, to reduce pain/ discomfort, and to improve safe ambulation and functional mobility.   Baseline dependent   Time 12   Period Weeks   Status Achieved     OT LONG TERM GOAL #4   Title Lymphedema (LE) self-care:  Pt >/= 85 % compliant with all daily, LE self-care protocols for home program with Max caregiver assistance, including simple self-manual lymphatic drainage (MLD), skin care, lymphatic pumping the ex, skin care, and donning/ doffing compression wraps and garments o limit LE progression and further functional decline.     Baseline dependent   Time 12   Period Weeks   Status Not Met     OT LONG TERM GOAL #5   Title Lymphedema (LE) self-care:  Pt to tolerate daily compression wraps, garments and devices in keeping w/ prescribed wear regime within 1 week of issue date to progress and retain clinical and functional gains and to limit LE progression.   Baseline dependent   Period Days   Status Achieved     OT LONG TERM GOAL #6   Title Lymphedema (LE) self-care:  During Management Phase CDT Pt to sustain limb volume reductions achieved during Intensive Phase CDT within 5% utilizing LE self-care protocols, appropriate compression garments/ devices, and needed level of caregiver assistance.   Baseline dependent   Time 3   Period Months   Status Achieved               Plan - 02/26/16 1751    Clinical Impression Statement Pt returns for 30 day F/U for lymphedema care . He is accompanied by his spouse. Pt presents without recommended compression garments in place. Pt appears to have lost weight which his wife endorses, so this affects volumetrics measurements/ calculations today somewhat.  Despite system reduction, BLE swelling remains mild and well controlled. Skin is well hydrated and Pt denies leg pain. BLE comparative limb volumetrics reveal swelling reductions bilaterally that meet and exceed goal ranges. The RLE is decreased in volume below the knee by 20.36%, and full leg reduction achieved measures 14.10%. LLE volume reductions    reveal 19.28% decrease below the knee and a 17.90% decrease in the A-G limb volume since commencing treatment.  It is doubtful that Pt is more compliant w/ LE self care regimes now  than he was throughout treatment, so this goal is not achieved. Overall, Pt has made excellent progress and no longer requires skilled Occupational Therapy management of chronic BLE lymphedema. He is discharged from OT today. He agrees to call with any future questions / concerns. It has been a pleasure serving Vincent Mason. I wish him well!    Rehab Potential Good   OT Frequency 3x / week   OT Duration 12 weeks   OT Treatment/Interventions Self-care/ADL training;DME and/or AE instruction;Manual lymph drainage;Patient/family education;Compression bandaging;Therapeutic exercises;Therapeutic exercise;Therapeutic activities;Energy conservation;Manual Therapy      Patient will benefit from skilled therapeutic intervention in order to improve the following deficits and impairments:  Decreased knowledge of use of DME, Decreased skin integrity, Increased edema, Cardiopulmonary status limiting activity, Decreased mobility, Other (comment), Decreased activity tolerance, Decreased endurance, Decreased strength, Decreased knowledge of precautions, Difficulty walking, Impaired flexibility, Pain, Impaired sensation, Decreased balance, Abnormal gait  Visit Diagnosis: Lymphedema of both lower extremities      G-Codes - 03/02/2016 1801    Functional Assessment Tool Used Clinical observation and examination, medical records review, Pt and caregiver interview, comparative limb volumetrics    Functional Limitation Self care   Self Care Current Status (T2458) At least 40 percent but less than 60 percent impaired, limited or restricted   Self Care Discharge Status [phone]) At least 40 percent but less than 60 percent impaired, limited or restricted      Problem List Patient Active Problem List   Diagnosis Date Noted  . Hypomagnesemia 09/14/2015  . Oral thrush 09/14/2015  . Acute respiratory failure with hypoxia ([place]) 09/12/2015  . HCAP (healthcare-associated pneumonia) 09/12/2015  . C. difficile colitis 09/12/2015  . Acute on chronic diastolic CHF (congestive heart failure), NYHA class 1 ([place]) 09/12/2015  . Severe sepsis with septic shock ([place]) 09/12/2015  . Chronic atrial fibrillation ([place]) 09/12/2015  . Essential hypertension 09/12/2015  . ATN (acute tubular necrosis) ([place]) 09/12/2015  . Fecal occult blood test positive 09/12/2015  . Hepatic cirrhosis ([place])   . Abdominal pain   . Status post peripherally inserted central catheter (PICC) central line placement   . Acute on chronic renal failure ([place]) 09/03/2015  . Normocytic anemia 09/03/2015  . Opioid dependence ([place]) 09/03/2015  . Pulmonary arterial hypertension 09/03/2015  . Acute encephalopathy 09/03/2015  . Open leg wound 09/02/2015  . Lymphedema 08/26/2015  . Hypokalemia 08/26/2015  . Hyperuricemia 07/09/2015  . Coronary artery disease 01/22/2015  . Chronic kidney disease (CKD), stage III (moderate) 01/22/2015  . Chronic constipation 01/22/2015  . COPD (chronic obstructive pulmonary disease) ([place]) 01/22/2015  . ED (erectile dysfunction) of organic origin 01/22/2015  . Lymphedema of both lower extremities 01/22/2015  . Fatigue 01/22/2015  . Blood in the urine 01/22/2015  . Calculus of kidney 01/22/2015  . Memory loss 01/22/2015  . Hypertensive pulmonary vascular disease 01/22/2015  . Displacement of cervical intervertebral disc without myelopathy 01/22/2015  . Seizures ([place]) 01/17/2015  . Obstructive sleep  apnea 01/17/2015  . Hyperlipidemia 01/17/2015  . Leg swelling 01/17/2015  . Degenerative disc disease, lumbar 01/17/2015  . Back pain 11/12/2014  . TI (tricuspid incompetence) 08/02/2014    Andrey Spearman, MS, OTR/L, [facility] 03-02-2016 6:04 PM   [place] MAIN [facility] SERVICES [address] [place], [place], [address] Phone: [phone]   Fax:  [phone]  Name: Vincent Mason MRN: [id number] Date of Birth: 1935-12-09

## 2016-02-26 NOTE — Patient Instructions (Addendum)

## 2016-03-04 ENCOUNTER — Other Ambulatory Visit: Payer: Self-pay | Admitting: Family Medicine

## 2016-03-04 NOTE — Telephone Encounter (Signed)
Pt contacted office for refill request on the following medications: ° °oxyCODONE (ROXICODONE) 15 MG immediate release tablet ° °CB#[phone]/MW °

## 2016-03-05 ENCOUNTER — Telehealth: Payer: Self-pay | Admitting: Family Medicine

## 2016-03-05 MED ORDER — OXYCODONE HCL 15 MG PO TABS: 15.0000 mg | ORAL_TABLET | Freq: Four times a day (QID) | ORAL | 0 refills | Status: DC | PRN

## 2016-03-05 MED ORDER — PRAMIPEXOLE DIHYDROCHLORIDE 0.25 MG PO TABS: ORAL_TABLET | ORAL | 3 refills | Status: DC

## 2016-03-05 NOTE — Telephone Encounter (Signed)
Patient needs a refill on pramipexole (MIRAPEX) 0.25 MG tablet.  He has two days left.  Patient uses [facility].  Please call and let them know when it is sent in or if they need to pick up.

## 2016-03-06 NOTE — Telephone Encounter (Signed)
Pt's wife called to see if Rx for pramipexole (MIRAPEX) 0.25 MG tablet was approved and could it be called in today since pt will run out over the weekend. Thanks TNP

## 2016-03-06 NOTE — Telephone Encounter (Signed)
Rx has already been sent to pharmacy

## 2016-03-09 ENCOUNTER — Other Ambulatory Visit: Payer: Self-pay

## 2016-03-09 NOTE — Telephone Encounter (Signed)
Patient's wife called saying that patient needs a refill on medication. Pharmacy they have listed is correct. Thanks!

## 2016-03-16 ENCOUNTER — Encounter: Payer: Medicare Other | Admitting: Physical Therapy

## 2016-03-19 ENCOUNTER — Encounter: Payer: Medicare Other | Admitting: Physical Therapy

## 2016-03-27 ENCOUNTER — Telehealth: Payer: Self-pay | Admitting: Family Medicine

## 2016-03-27 ENCOUNTER — Other Ambulatory Visit: Payer: Self-pay | Admitting: Family Medicine

## 2016-03-27 MED ORDER — SILDENAFIL CITRATE 20 MG PO TABS: 20.0000 mg | ORAL_TABLET | Freq: Three times a day (TID) | ORAL | 12 refills | Status: DC

## 2016-03-27 NOTE — Telephone Encounter (Signed)
Please advise 

## 2016-03-27 NOTE — Telephone Encounter (Signed)
Pt contacted office for refill request on the following medications:  sildenafil (REVATIO) 20 MG tablet.  Pt states he is taking 4 pills a day so he is requesting more that 30 pill per Rx.  Vincent Mason.  [id number]

## 2016-04-03 ENCOUNTER — Other Ambulatory Visit: Payer: Self-pay | Admitting: Family Medicine

## 2016-04-03 MED ORDER — OXYCODONE HCL 15 MG PO TABS: 15.0000 mg | ORAL_TABLET | Freq: Four times a day (QID) | ORAL | 0 refills | Status: DC | PRN

## 2016-04-03 NOTE — Telephone Encounter (Signed)
Pt contacted office for refill request on the following medications: ° °oxyCODONE (ROXICODONE) 15 MG immediate release tablet ° °CB#[phone]/MW °

## 2016-04-10 DIAGNOSIS — I10 Essential (primary) hypertension: Secondary | ICD-10-CM | POA: Diagnosis not present

## 2016-04-10 DIAGNOSIS — N183 Chronic kidney disease, stage 3 (moderate): Secondary | ICD-10-CM | POA: Diagnosis not present

## 2016-04-10 DIAGNOSIS — I25810 Atherosclerosis of coronary artery bypass graft(s) without angina pectoris: Secondary | ICD-10-CM | POA: Diagnosis not present

## 2016-04-10 DIAGNOSIS — R42 Dizziness and giddiness: Secondary | ICD-10-CM | POA: Diagnosis not present

## 2016-04-10 DIAGNOSIS — I482 Chronic atrial fibrillation: Secondary | ICD-10-CM | POA: Diagnosis not present

## 2016-04-13 ENCOUNTER — Other Ambulatory Visit: Payer: Self-pay | Admitting: Family Medicine

## 2016-04-15 DIAGNOSIS — I482 Chronic atrial fibrillation: Secondary | ICD-10-CM | POA: Diagnosis not present

## 2016-04-22 DIAGNOSIS — I482 Chronic atrial fibrillation: Secondary | ICD-10-CM | POA: Diagnosis not present

## 2016-04-22 DIAGNOSIS — I10 Essential (primary) hypertension: Secondary | ICD-10-CM | POA: Diagnosis not present

## 2016-04-24 ENCOUNTER — Telehealth: Payer: Self-pay | Admitting: Family Medicine

## 2016-04-24 MED ORDER — RIVAROXABAN 20 MG PO TABS
20.0000 mg | ORAL_TABLET | Freq: Every day | ORAL | 6 refills | Status: DC
Start: 2016-04-24 — End: 2016-12-02

## 2016-04-24 NOTE — Telephone Encounter (Signed)
He is supposed to be taking Xarelto. This medication was stopped when he was in the hospital earlier this year because his blood count was very low, but should be back on it now. If he doesn't have any then can send in prescription for xarelto 20mg  once tablet daily #30 rf x 6

## 2016-04-24 NOTE — Telephone Encounter (Signed)
Pt advised and RX sent in-aa 

## 2016-04-24 NOTE — Telephone Encounter (Signed)
Please advise 

## 2016-04-24 NOTE — Telephone Encounter (Signed)
Pt went to cardio yesterday, Dr. Nehemiah Massed and he wanted to know why pt is not taking a blood thinner.  Pt states he use to take Warfarin , then something else (not sure what) but is not been taking anything for his A-fib for quite a while now.  Pt's cal back is [phone]  thanks teri

## 2016-05-04 ENCOUNTER — Other Ambulatory Visit: Payer: Self-pay | Admitting: Family Medicine

## 2016-05-04 MED ORDER — OXYCODONE HCL 15 MG PO TABS: 15.0000 mg | ORAL_TABLET | Freq: Four times a day (QID) | ORAL | 0 refills | Status: DC | PRN

## 2016-05-04 NOTE — Telephone Encounter (Signed)
Pt contacted office for refill request on the following medications: ° °oxyCODONE (ROXICODONE) 15 MG immediate release tablet ° °CB#[phone]/MW °

## 2016-05-15 ENCOUNTER — Other Ambulatory Visit: Payer: Self-pay | Admitting: Family Medicine

## 2016-05-20 ENCOUNTER — Other Ambulatory Visit: Payer: Self-pay | Admitting: Family Medicine

## 2016-06-05 ENCOUNTER — Telehealth: Payer: Self-pay | Admitting: Family Medicine

## 2016-06-05 NOTE — Telephone Encounter (Signed)
Called Pt to schedule AWV with [facility] - knb °

## 2016-06-13 ENCOUNTER — Other Ambulatory Visit: Payer: Self-pay | Admitting: Family Medicine

## 2016-06-15 ENCOUNTER — Other Ambulatory Visit: Payer: Self-pay | Admitting: Family Medicine

## 2016-06-15 MED ORDER — OXYCODONE HCL 15 MG PO TABS: 15.0000 mg | ORAL_TABLET | Freq: Four times a day (QID) | ORAL | 0 refills | Status: DC | PRN

## 2016-06-15 NOTE — Telephone Encounter (Signed)
Pt needs refill on his  oxyCODONE (ROXICODONE) 15 MG immediate release tablet  Thanks Con [facility]os

## 2016-06-17 ENCOUNTER — Encounter: Payer: Self-pay | Admitting: Family Medicine

## 2016-06-17 ENCOUNTER — Ambulatory Visit (INDEPENDENT_AMBULATORY_CARE_PROVIDER_SITE_OTHER): Payer: Medicare Other | Admitting: Family Medicine

## 2016-06-17 VITALS — BP 110/60 | HR 90 | Temp 98.6°F | Resp 16 | Wt 203.0 lb

## 2016-06-17 DIAGNOSIS — D649 Anemia, unspecified: Secondary | ICD-10-CM

## 2016-06-17 DIAGNOSIS — R059 Cough, unspecified: Secondary | ICD-10-CM

## 2016-06-17 DIAGNOSIS — Z23 Encounter for immunization: Secondary | ICD-10-CM

## 2016-06-17 DIAGNOSIS — R252 Cramp and spasm: Secondary | ICD-10-CM | POA: Diagnosis not present

## 2016-06-17 DIAGNOSIS — E790 Hyperuricemia without signs of inflammatory arthritis and tophaceous disease: Secondary | ICD-10-CM

## 2016-06-17 DIAGNOSIS — E876 Hypokalemia: Secondary | ICD-10-CM

## 2016-06-17 DIAGNOSIS — R05 Cough: Secondary | ICD-10-CM | POA: Diagnosis not present

## 2016-06-17 MED ORDER — METHOCARBAMOL 750 MG PO TABS: 750.0000 mg | ORAL_TABLET | Freq: Every evening | ORAL | 0 refills | Status: DC | PRN

## 2016-06-17 MED ORDER — METOLAZONE 5 MG PO TABS: ORAL_TABLET | ORAL | 6 refills | Status: DC

## 2016-06-17 NOTE — Progress Notes (Signed)
Patient: Vincent Mason Male    DOB: [birth year]   81 y.o.   MRN: [id number] Visit Date: 06/17/2016  Today's Provider: Lelon Huh, MD   Chief Complaint  Patient presents with  . Cough   Subjective:    Cough  Associated symptoms include myalgias (achiness and cramps in legs) and rhinorrhea. Pertinent negatives include no chest pain, chills, fever, headaches, postnasal drip, sore throat, shortness of breath or wheezing.   He is primarily concerned about leg cramps today. They have been much worse the last 2 weeks. Affecting both legs primarily in thighs. Feels bette when he stands up and walks around. Worse after lying down and is keeping awake at night. Has history of peripheral edema but has been much better the last several months on current diuretic regiment.     Allergies  Allergen Reactions  . Decongestant  [Oxymetazoline]      Current Outpatient Prescriptions:  .  allopurinol (ZYLOPRIM) 100 MG tablet, Reported on 12/12/2015, Disp: , Rfl:  .  benazepril (LOTENSIN) 20 MG tablet, Take 1 tablet by mouth daily. Reported on 12/12/2015, Disp: , Rfl:  .  colchicine 0.6 MG tablet, TAKE ONE (1) TABLET EACH DAY AS NEEDED FOR GOUT, Disp: 30 tablet, Rfl: 5 .  DIGOX 125 MCG tablet, , Disp: , Rfl:  .  Digoxin 62.5 MCG TABS, Take 0.0625 mg by mouth daily., Disp: , Rfl:  .  famotidine (PEPCID) 20 MG tablet, Take 1 tablet (20 mg total) by mouth daily., Disp: , Rfl:  .  gabapentin (NEURONTIN) 100 MG capsule, Take 1 capsule (100 mg total) by mouth 3 (three) times daily., Disp: , Rfl:  .  hydrocortisone-pramoxine (PROCTOFOAM-HC) rectal foam, Place 1 applicator rectally 2 (two) times daily., Disp: 10 g, Rfl: 0 .  ipratropium-albuterol (DUONEB) 0.5-2.5 (3) MG/3ML SOLN, Take 3 mLs by nebulization every 6 (six) hours as needed., Disp: 360 mL, Rfl:  .  lidocaine (XYLOCAINE) 2 % solution, Use as directed 15 mLs in the mouth or throat every 3 (three) hours as needed for mouth pain., Disp: 100 mL,  Rfl: 0 .  Maltodextrin-Xanthan Gum (RESOURCE THICKENUP CLEAR) POWD, Nectar thicken consistency, Disp: , Rfl:  .  metolazone (ZAROXOLYN) 5 MG tablet,  TAKE ONE TO TWO TABLETS DAILY AS NEEDEDFOR SWELLING, Disp: 60 tablet, Rfl: 0 .  metoprolol succinate (TOPROL-XL) 100 MG 24 hr tablet, Take 100 mg by mouth daily. Take with or immediately following a meal., Disp: , Rfl:  .  OXcarbazepine (TRILEPTAL) 150 MG tablet, Take 1 tablet (150 mg total) by mouth 2 (two) times daily. Prescribed by Dr. Melrose Nakayama, Disp: 1 tablet, Rfl: 1 .  oxyCODONE (ROXICODONE) 15 MG immediate release tablet, Take 1 tablet (15 mg total) by mouth every 6 (six) hours as needed for pain., Disp: 120 tablet, Rfl: 0 .  potassium chloride (K-DUR) 10 MEQ tablet, TAKE FOUR TABLETS TWICE A DAY, Disp: 240 tablet, Rfl: 5 .  pramipexole (MIRAPEX) 0.25 MG tablet, 1-2 tablets at bedtime as needed for restless legs, Disp: 60 tablet, Rfl: 3 .  ranitidine (ZANTAC) 150 MG tablet, Take 1 tablet by mouth 2 (two) times daily as needed. Reported on 12/12/2015, Disp: , Rfl:  .  rivaroxaban (XARELTO) 20 MG TABS tablet, Take 1 tablet (20 mg total) by mouth daily with supper., Disp: 30 tablet, Rfl: 6 .  rOPINIRole (REQUIP) 4 MG tablet, TAKE ONE TABLET BY MOUTH EVERY NIGHT AT BEDTIME FOR RESTLESS LEG SYNDROME, Disp: 30 tablet, Rfl: 11 .  rosuvastatin (CRESTOR) 40 MG tablet, Take 1 tablet (40 mg total) by mouth daily., Disp: 30 tablet, Rfl: 12 .  sildenafil (REVATIO) 20 MG tablet, Take 1 tablet (20 mg total) by mouth 3 (three) times daily., Disp: 90 tablet, Rfl: 12 .  torsemide (DEMADEX) 20 MG tablet, 2 tablets once or twice a day for swelling (Patient taking differently: Take 40 mg by mouth 2 (two) times daily. ), Disp: 1 tablet, Rfl: 1 .  ULORIC 80 MG TABS, TAKE ONE (1) TABLET EACH DAY, Disp: 30 each, Rfl: 12 .  zolpidem (AMBIEN) 5 MG tablet, Take 1 tablet (5 mg total) by mouth at bedtime. Reported on 07/08/2015, Disp: 30 tablet, Rfl: 0  Review of Systems    Constitutional: Positive for fatigue. Negative for appetite change, chills, diaphoresis and fever.  HENT: Positive for congestion, rhinorrhea and sneezing. Negative for mouth sores, nosebleeds, postnasal drip, sinus pain, sinus pressure and sore throat.   Respiratory: Positive for cough (productive cough with white phlegm). Negative for chest tightness, shortness of breath and wheezing.   Cardiovascular: Negative for chest pain and palpitations.  Gastrointestinal: Negative for abdominal pain, nausea and vomiting.  Endocrine: Positive for polyuria.  Musculoskeletal: Positive for myalgias (achiness and cramps in legs).  Neurological: Negative for dizziness and headaches.    Social History  Substance Use Topics  . Smoking status: Former Research scientist (life sciences)  . Smokeless tobacco: Never Used     Comment: Quit in 1990  . Alcohol use No   Objective:   BP 110/60   Pulse 90   Temp 98.6 F (37 C) (Oral)   Resp 16   Wt 203 lb (92.1 kg)   SpO2 96% Comment: room air  BMI 28.31 kg/m   Physical Exam   General Appearance:    Alert, cooperative, no distress  Eyes:    PERRL, conjunctiva/corneas clear, EOM's intact  Scant post nasal drainage.      Lungs:     Clear to auscultation bilaterally, respirations unlabored  Heart:    Regular rate and rhythm. 2+ bipedal edema.   Neurologic:   Awake, alert, oriented x 3. No apparent focal neurological           defect.           Assessment & Plan:     1. Hyperuricemia  - Uric acid  2. Hypokalemia  - Comprehensive metabolic panel  3. Hypomagnesemia  - Magnesium  4. Bilateral leg cramps  - methocarbamol (ROBAXIN-750) 750 MG tablet; Take 1 tablet (750 mg total) by mouth at bedtime as needed for muscle spasms.  Dispense: 20 tablet; Refill: 0 - Comprehensive metabolic panel - Magnesium - CK (Creatine Kinase)  5. Anemia, unspecified type  - CBC - Ferritin  6. Cough Mild, no other constitutional symptoms. Call if symptoms change or worsen.   7.  Need for influenza vaccination  - Flu vaccine HIGH DOSE PF       Lelon Huh, MD  [facility]

## 2016-06-18 ENCOUNTER — Telehealth: Payer: Self-pay | Admitting: *Deleted

## 2016-06-18 LAB — CBC
Hematocrit: 36.4 % — ABNORMAL LOW (ref 37.5–51.0)
Hemoglobin: 12.5 g/dL — ABNORMAL LOW (ref 13.0–17.7)
MCH: 32.4 pg (ref 26.6–33.0)
MCHC: 34.3 g/dL (ref 31.5–35.7)
MCV: 94 fL (ref 79–97)
Platelets: 178 10*3/uL (ref 150–379)
RBC: 3.86 x10E6/uL — ABNORMAL LOW (ref 4.14–5.80)
RDW: 15.8 % — ABNORMAL HIGH (ref 12.3–15.4)
WBC: 8.7 10*3/uL (ref 3.4–10.8)

## 2016-06-18 LAB — COMPREHENSIVE METABOLIC PANEL
ALT: 49 IU/L — ABNORMAL HIGH (ref 0–44)
AST: 66 IU/L — ABNORMAL HIGH (ref 0–40)
Albumin/Globulin Ratio: 1.7 (ref 1.2–2.2)
Albumin: 4.3 g/dL (ref 3.5–4.8)
Alkaline Phosphatase: 48 IU/L (ref 39–117)
BUN/Creatinine Ratio: 16 (ref 10–24)
BUN: 39 mg/dL — ABNORMAL HIGH (ref 8–27)
Bilirubin Total: 0.5 mg/dL (ref 0.0–1.2)
CO2: 30 mmol/L — ABNORMAL HIGH (ref 18–29)
Calcium: 8.3 mg/dL — ABNORMAL LOW (ref 8.6–10.2)
Chloride: 89 mmol/L — ABNORMAL LOW (ref 96–106)
Creatinine, Ser: 2.38 mg/dL — ABNORMAL HIGH (ref 0.76–1.27)
GFR calc Af Amer: 29 mL/min/{1.73_m2} — ABNORMAL LOW (ref >59)
GFR calc non Af Amer: 25 mL/min/{1.73_m2} — ABNORMAL LOW (ref >59)
Globulin, Total: 2.6 g/dL (ref 1.5–4.5)
Glucose: 129 mg/dL — ABNORMAL HIGH (ref 65–99)
Potassium: 3.2 mmol/L — ABNORMAL LOW (ref 3.5–5.2)
Sodium: 138 mmol/L (ref 134–144)
Total Protein: 6.9 g/dL (ref 6.0–8.5)

## 2016-06-18 LAB — FERRITIN: Ferritin: 254 ng/mL (ref 30–400)

## 2016-06-18 LAB — CK: Total CK: 248 U/L — ABNORMAL HIGH (ref 24–204)

## 2016-06-18 LAB — MAGNESIUM: Magnesium: 1.5 mg/dL — ABNORMAL LOW (ref 1.6–2.3)

## 2016-06-18 LAB — URIC ACID: Uric Acid: 6.6 mg/dL (ref 3.7–8.6)

## 2016-06-18 MED ORDER — POTASSIUM CHLORIDE CRYS ER 20 MEQ PO TBCR: 20.0000 meq | EXTENDED_RELEASE_TABLET | Freq: Two times a day (BID) | ORAL | 5 refills | Status: DC

## 2016-06-18 NOTE — Telephone Encounter (Signed)
Patient was notified of results. Expressed understanding. Rx sent to pharmacy. 

## 2016-06-18 NOTE — Telephone Encounter (Signed)
-----   Message from Birdie Sons, MD sent at 06/18/2016  1:22 PM EST ----- Potassium levels are low, magnesium levels are low, and kidney functions have worsened. Need to increase potassium chloride to 17meQ twice a day, #60, rf x 5. Also need to take OTC magnesium oxide 500mg  once a day. Drink more water. Can continue muscle relaxer at night that was prescribed. Follow up o.v. And labs in a month.

## 2016-06-20 ENCOUNTER — Other Ambulatory Visit: Payer: Self-pay | Admitting: Family Medicine

## 2016-06-20 MED ORDER — AZITHROMYCIN 250 MG PO TABS: ORAL_TABLET | ORAL | 0 refills | Status: AC

## 2016-07-03 ENCOUNTER — Telehealth: Payer: Self-pay

## 2016-07-03 DIAGNOSIS — R252 Cramp and spasm: Secondary | ICD-10-CM

## 2016-07-03 MED ORDER — METHOCARBAMOL 750 MG PO TABS: 750.0000 mg | ORAL_TABLET | Freq: Every evening | ORAL | 5 refills | Status: AC | PRN

## 2016-07-03 NOTE — Telephone Encounter (Signed)
Have sent refill to [facility].

## 2016-07-03 NOTE — Telephone Encounter (Signed)
Mrs. Letter advised. Renaldo Fiddler, CMA

## 2016-07-03 NOTE — Telephone Encounter (Signed)
Wife called states that methocarbamol has really helped patient with his muscles and relaxing them but they do not have any refill on this medication and wanted to know if they could get a refill? Please review-aa

## 2016-07-16 ENCOUNTER — Ambulatory Visit (INDEPENDENT_AMBULATORY_CARE_PROVIDER_SITE_OTHER): Payer: Medicare Other | Admitting: Family Medicine

## 2016-07-16 ENCOUNTER — Encounter: Payer: Self-pay | Admitting: Family Medicine

## 2016-07-16 VITALS — BP 126/76 | HR 72 | Temp 98.1°F | Resp 16 | Wt 220.0 lb

## 2016-07-16 DIAGNOSIS — E876 Hypokalemia: Secondary | ICD-10-CM | POA: Diagnosis not present

## 2016-07-16 DIAGNOSIS — R635 Abnormal weight gain: Secondary | ICD-10-CM | POA: Diagnosis not present

## 2016-07-16 DIAGNOSIS — R252 Cramp and spasm: Secondary | ICD-10-CM

## 2016-07-16 NOTE — Patient Instructions (Signed)
Try replacing sugar in tea with Monia Pouch

## 2016-07-16 NOTE — Progress Notes (Signed)
Patient: Vincent Mason Male    DOB: Nov 04, 1935   81 y.o.   MRN: [id number] Visit Date: 07/16/2016  Today's Provider: Lelon Huh, MD   Chief Complaint  Patient presents with  . hypokalemia    follow up   . hypomagnesiemia   Subjective:    HPI Hypokalemia, follow up:  Patient was last seen in the office 1 month ago. On 06/17/2016, the patient's labs showed a low potassium level. Patient was to increase his potassium supplement to 77meQ daily. Patient reports that he has been tolerating med changes well.  Hypomagnesemia, follow up: Patient was last seen in the office 1 month ago. On 06/17/2016, the patient's labs showed low magnesium levels. Patient was advised to start OTC magnesium oxide 500mg  daily. Patient was also advised to increase water intake.   He states that leg cramps have greatly improved since last visit and he has been able to stop taking quinine. However he has been drinking a lot more fluids including sweet tea. He noticed swelling in legs worsening over the last couple of weeks, .He hasn't had any worsening of breathing.   Wt Readings from Last 3 Encounters:  07/16/16 220 lb (99.8 kg)  06/17/16 203 lb (92.1 kg)  01/22/16 181 lb (82.1 kg)       Allergies  Allergen Reactions  . Decongestant  [Oxymetazoline]      Current Outpatient Prescriptions:  .  allopurinol (ZYLOPRIM) 100 MG tablet, Reported on 12/12/2015, Disp: , Rfl:  .  benazepril (LOTENSIN) 20 MG tablet, Take 1 tablet by mouth daily. Reported on 12/12/2015, Disp: , Rfl:  .  colchicine 0.6 MG tablet, TAKE ONE (1) TABLET EACH DAY AS NEEDED FOR GOUT, Disp: 30 tablet, Rfl: 5 .  DIGOX 125 MCG tablet, , Disp: , Rfl:  .  Digoxin 62.5 MCG TABS, Take 0.0625 mg by mouth daily., Disp: , Rfl:  .  famotidine (PEPCID) 20 MG tablet, Take 1 tablet (20 mg total) by mouth daily., Disp: , Rfl:  .  gabapentin (NEURONTIN) 100 MG capsule, Take 1 capsule (100 mg total) by mouth 3 (three) times daily., Disp: , Rfl:  .   hydrocortisone-pramoxine (PROCTOFOAM-HC) rectal foam, Place 1 applicator rectally 2 (two) times daily., Disp: 10 g, Rfl: 0 .  ipratropium-albuterol (DUONEB) 0.5-2.5 (3) MG/3ML SOLN, Take 3 mLs by nebulization every 6 (six) hours as needed., Disp: 360 mL, Rfl:  .  lidocaine (XYLOCAINE) 2 % solution, Use as directed 15 mLs in the mouth or throat every 3 (three) hours as needed for mouth pain., Disp: 100 mL, Rfl: 0 .  Maltodextrin-Xanthan Gum (RESOURCE THICKENUP CLEAR) POWD, Nectar thicken consistency, Disp: , Rfl:  .  methocarbamol (ROBAXIN-750) 750 MG tablet, Take 1 tablet (750 mg total) by mouth at bedtime as needed for muscle spasms., Disp: 20 tablet, Rfl: 5 .  metolazone (ZAROXOLYN) 5 MG tablet, TAKE ONE TO TWO TABLETS DAILY AS NEEDEDFOR SWELLING, Disp: 60 tablet, Rfl: 6 .  metoprolol succinate (TOPROL-XL) 100 MG 24 hr tablet, Take 100 mg by mouth daily. Take with or immediately following a meal., Disp: , Rfl:  .  OXcarbazepine (TRILEPTAL) 150 MG tablet, Take 1 tablet (150 mg total) by mouth 2 (two) times daily. Prescribed by Dr. Melrose Nakayama, Disp: 1 tablet, Rfl: 1 .  oxyCODONE (ROXICODONE) 15 MG immediate release tablet, Take 1 tablet (15 mg total) by mouth every 6 (six) hours as needed for pain., Disp: 120 tablet, Rfl: 0 .  potassium chloride SA (  K-DUR,KLOR-CON) 20 MEQ tablet, Take 1 tablet (20 mEq total) by mouth 2 (two) times daily., Disp: 60 tablet, Rfl: 5 .  pramipexole (MIRAPEX) 0.25 MG tablet, 1-2 tablets at bedtime as needed for restless legs, Disp: 60 tablet, Rfl: 3 .  ranitidine (ZANTAC) 150 MG tablet, Take 1 tablet by mouth 2 (two) times daily as needed. Reported on 12/12/2015, Disp: , Rfl:  .  rivaroxaban (XARELTO) 20 MG TABS tablet, Take 1 tablet (20 mg total) by mouth daily with supper., Disp: 30 tablet, Rfl: 6 .  rOPINIRole (REQUIP) 4 MG tablet, TAKE ONE TABLET BY MOUTH EVERY NIGHT AT BEDTIME FOR RESTLESS LEG SYNDROME, Disp: 30 tablet, Rfl: 11 .  rosuvastatin (CRESTOR) 40 MG tablet, Take 1  tablet (40 mg total) by mouth daily., Disp: 30 tablet, Rfl: 12 .  sildenafil (REVATIO) 20 MG tablet, Take 1 tablet (20 mg total) by mouth 3 (three) times daily., Disp: 90 tablet, Rfl: 12 .  torsemide (DEMADEX) 20 MG tablet, 2 tablets once or twice a day for swelling (Patient taking differently: Take 40 mg by mouth 2 (two) times daily. ), Disp: 1 tablet, Rfl: 1 .  ULORIC 80 MG TABS, TAKE ONE (1) TABLET EACH DAY, Disp: 30 each, Rfl: 12 .  zolpidem (AMBIEN) 5 MG tablet, Take 1 tablet (5 mg total) by mouth at bedtime. Reported on 07/08/2015, Disp: 30 tablet, Rfl: 0 .  potassium chloride (K-DUR) 10 MEQ tablet, TAKE FOUR TABLETS TWICE A DAY (Patient not taking: Reported on 07/16/2016), Disp: 240 tablet, Rfl: 5  Review of Systems  Constitutional: Positive for activity change, appetite change, fatigue and unexpected weight change.  Respiratory: Negative.   Cardiovascular: Negative.   Neurological: Negative.      Social History  Substance Use Topics  . Smoking status: Former Research scientist (life sciences)  . Smokeless tobacco: Never Used     Comment: Quit in 1990  . Alcohol use No   Objective:   BP 126/76 (BP Location: Right Arm, Patient Position: Sitting, Cuff Size: Large)   Pulse 72   Temp 98.1 F (36.7 C)   Resp 16   Wt 220 lb (99.8 kg)   BMI 30.68 kg/m   Physical Exam   General Appearance:    Alert, cooperative, no distress  Eyes:    PERRL, conjunctiva/corneas clear, EOM's intact       Lungs:     Clear to auscultation bilaterally, respirations unlabored  Heart:     Irregularly irregular rhythm. Normal rate 3+ bipedal edema.   Neurologic:   Awake, alert, oriented x 3. No apparent focal neurological           defect.           Assessment & Plan:     1. Hypokalemia Tolerating increase potassium replacement.  - Renal function panel  2. Hypomagnesemia  - Magnesium  3. Bilateral leg cramps Much better since last visit after which potassum was increased, magnesium supplements started and increased  fluid intake.   4. Weight gain.  Has increased 17 pounds in last month with a little increase in leg swelling. May be exacerbated by increased appetite and drinking more sweet tea which he was advised to stop drinking. Encouraged to drinking mostly water. Consider increasing diuretics if continues to gain weight and if kidney functions stable.        Lelon Huh, MD  [facility]

## 2016-07-17 LAB — RENAL FUNCTION PANEL
Albumin: 4.2 g/dL (ref 3.5–4.8)
BUN/Creatinine Ratio: 15 (ref 10–24)
BUN: 25 mg/dL (ref 8–27)
CO2: 28 mmol/L (ref 18–29)
Calcium: 9.2 mg/dL (ref 8.6–10.2)
Chloride: 95 mmol/L — ABNORMAL LOW (ref 96–106)
Creatinine, Ser: 1.68 mg/dL — ABNORMAL HIGH (ref 0.76–1.27)
GFR calc Af Amer: 44 — ABNORMAL LOW (ref >59)
GFR calc non Af Amer: 38 — ABNORMAL LOW (ref >59)
Glucose: 143 mg/dL — ABNORMAL HIGH (ref 65–99)
Phosphorus: 2.1 mg/dL — ABNORMAL LOW (ref 2.5–4.5)
Potassium: 3.7 mmol/L (ref 3.5–5.2)
Sodium: 141 mmol/L (ref 134–144)

## 2016-07-17 LAB — MAGNESIUM: Magnesium: 1.9 mg/dL (ref 1.6–2.3)

## 2016-07-20 ENCOUNTER — Other Ambulatory Visit: Payer: Self-pay | Admitting: Family Medicine

## 2016-07-21 ENCOUNTER — Other Ambulatory Visit: Payer: Self-pay | Admitting: Family Medicine

## 2016-07-21 MED ORDER — OXYCODONE HCL 15 MG PO TABS: 15.0000 mg | ORAL_TABLET | Freq: Four times a day (QID) | ORAL | 0 refills | Status: DC | PRN

## 2016-07-21 NOTE — Telephone Encounter (Signed)
Pt needs refill ° °oxyCODONE (ROXICODONE) 15 MG immediate release tablet ° °Thanks teri ° ° °

## 2016-07-21 NOTE — Telephone Encounter (Signed)
Please review. Thanks!  

## 2016-08-03 ENCOUNTER — Encounter: Payer: Self-pay | Admitting: Family Medicine

## 2016-08-03 ENCOUNTER — Ambulatory Visit (INDEPENDENT_AMBULATORY_CARE_PROVIDER_SITE_OTHER): Payer: Medicare Other | Admitting: Family Medicine

## 2016-08-03 VITALS — BP 110/60 | HR 77 | Temp 97.8°F | Resp 16 | Wt 219.0 lb

## 2016-08-03 DIAGNOSIS — H60501 Unspecified acute noninfective otitis externa, right ear: Secondary | ICD-10-CM | POA: Diagnosis not present

## 2016-08-03 MED ORDER — NEOMYCIN-POLYMYXIN-HC 3.5-10000-1 OT SOLN: 3.0000 [drp] | Freq: Four times a day (QID) | OTIC | 0 refills | Status: AC

## 2016-08-03 NOTE — Progress Notes (Signed)
Patient: Vincent Mason Male    DOB: Sep 21, 1935   81 y.o.   MRN: [id number] Visit Date: 08/03/2016  Today's Provider: Lelon Huh, MD   Chief Complaint  Patient presents with  . Cerumen Impaction   Subjective:    Patient states that his right ear has been stopped-up for 3 days. Patient stated that he has used ear drops but there has been no improvement.   it is not painful, but having trouble hearing. States he has tried 'everything' to clean it out, including using Q-tips.     Allergies  Allergen Reactions  . Decongestant  [Oxymetazoline]      Current Outpatient Prescriptions:  .  allopurinol (ZYLOPRIM) 100 MG tablet, Reported on 12/12/2015, Disp: , Rfl:  .  benazepril (LOTENSIN) 20 MG tablet, Take 1 tablet by mouth daily. Reported on 12/12/2015, Disp: , Rfl:  .  colchicine 0.6 MG tablet, TAKE ONE (1) TABLET EACH DAY AS NEEDED FOR GOUT, Disp: 30 tablet, Rfl: 5 .  DIGOX 125 MCG tablet, , Disp: , Rfl:  .  Digoxin 62.5 MCG TABS, Take 0.0625 mg by mouth daily., Disp: , Rfl:  .  famotidine (PEPCID) 20 MG tablet, Take 1 tablet (20 mg total) by mouth daily., Disp: , Rfl:  .  gabapentin (NEURONTIN) 100 MG capsule, Take 1 capsule (100 mg total) by mouth 3 (three) times daily., Disp: , Rfl:  .  hydrocortisone-pramoxine (PROCTOFOAM-HC) rectal foam, Place 1 applicator rectally 2 (two) times daily., Disp: 10 g, Rfl: 0 .  ipratropium-albuterol (DUONEB) 0.5-2.5 (3) MG/3ML SOLN, Take 3 mLs by nebulization every 6 (six) hours as needed., Disp: 360 mL, Rfl:  .  lidocaine (XYLOCAINE) 2 % solution, Use as directed 15 mLs in the mouth or throat every 3 (three) hours as needed for mouth pain., Disp: 100 mL, Rfl: 0 .  Maltodextrin-Xanthan Gum (RESOURCE THICKENUP CLEAR) POWD, Nectar thicken consistency, Disp: , Rfl:  .  metolazone (ZAROXOLYN) 5 MG tablet, TAKE ONE TO TWO TABLETS DAILY AS NEEDEDFOR SWELLING, Disp: 60 tablet, Rfl: 6 .  metoprolol succinate (TOPROL-XL) 100 MG 24 hr tablet, Take 100  mg by mouth daily. Take with or immediately following a meal., Disp: , Rfl:  .  OXcarbazepine (TRILEPTAL) 150 MG tablet, Take 1 tablet (150 mg total) by mouth 2 (two) times daily. Prescribed by Dr. Melrose Nakayama, Disp: 1 tablet, Rfl: 1 .  oxyCODONE (ROXICODONE) 15 MG immediate release tablet, Take 1 tablet (15 mg total) by mouth every 6 (six) hours as needed for pain., Disp: 120 tablet, Rfl: 0 .  potassium chloride SA (K-DUR,KLOR-CON) 20 MEQ tablet, Take 1 tablet (20 mEq total) by mouth 2 (two) times daily., Disp: 60 tablet, Rfl: 5 .  pramipexole (MIRAPEX) 0.25 MG tablet, TAKE ONE TO TWO TABLETS BY MOUTH AT BEDTIME AS NEEDED FOR RESTLESS LEG, Disp: 60 tablet, Rfl: 5 .  ranitidine (ZANTAC) 150 MG tablet, Take 1 tablet by mouth 2 (two) times daily as needed. Reported on 12/12/2015, Disp: , Rfl:  .  rivaroxaban (XARELTO) 20 MG TABS tablet, Take 1 tablet (20 mg total) by mouth daily with supper., Disp: 30 tablet, Rfl: 6 .  rOPINIRole (REQUIP) 4 MG tablet, TAKE ONE TABLET BY MOUTH EVERY NIGHT AT BEDTIME FOR RESTLESS LEG SYNDROME, Disp: 30 tablet, Rfl: 11 .  rosuvastatin (CRESTOR) 40 MG tablet, Take 1 tablet (40 mg total) by mouth daily., Disp: 30 tablet, Rfl: 12 .  sildenafil (REVATIO) 20 MG tablet, Take 1 tablet (20 mg  total) by mouth 3 (three) times daily., Disp: 90 tablet, Rfl: 12 .  torsemide (DEMADEX) 20 MG tablet, 2 tablets once or twice a day for swelling (Patient taking differently: Take 40 mg by mouth 2 (two) times daily. ), Disp: 1 tablet, Rfl: 1 .  ULORIC 80 MG TABS, TAKE ONE (1) TABLET EACH DAY, Disp: 30 each, Rfl: 12 .  zolpidem (AMBIEN) 5 MG tablet, Take 1 tablet (5 mg total) by mouth at bedtime. Reported on 07/08/2015, Disp: 30 tablet, Rfl: 0  Review of Systems  Constitutional: Negative for appetite change, chills and fever.  Respiratory: Negative for chest tightness, shortness of breath and wheezing.   Cardiovascular: Negative for chest pain and palpitations.  Gastrointestinal: Negative for  abdominal pain, nausea and vomiting.    Social History  Substance Use Topics  . Smoking status: Former Research scientist (life sciences)  . Smokeless tobacco: Never Used     Comment: Quit in 1990  . Alcohol use No   Objective:   BP 110/60 (BP Location: Left Arm, Patient Position: Sitting, Cuff Size: Large)   Pulse 77   Temp 97.8 F (36.6 C) (Oral)   Resp 16   Wt 219 lb (99.3 kg)   SpO2 99%   BMI 30.54 kg/m     Physical Exam  General Appearance:    Alert, cooperative, no distress  HENT:   Right ear canal swollen and very inflamed. TM visualized, pearly grey, no effusion.           Assessment & Plan:     1. Acute non-infective otitis externa of right ear, unspecified type  - neomycin-polymyxin-hydrocortisone (CORTISPORIN) otic solution; Place 3 drops into the right ear 4 (four) times daily.  Dispense: 10 mL; Refill: 0  Call if symptoms change or if not rapidly improving.          Lelon Huh, MD  [facility]

## 2016-08-07 ENCOUNTER — Telehealth: Payer: Self-pay

## 2016-08-07 DIAGNOSIS — H9190 Unspecified hearing loss, unspecified ear: Secondary | ICD-10-CM

## 2016-08-07 NOTE — Telephone Encounter (Signed)
Needs referral to ENT

## 2016-08-07 NOTE — Telephone Encounter (Signed)
Patient agrees to proceed with ENT referral.  Thanks

## 2016-08-07 NOTE — Telephone Encounter (Signed)
Patient saw you on 08/03/16 and was given ear drops which he has been using and still is not better with his ear deafness. No change at all. No sound. What do you suggest at this time?-aa

## 2016-08-10 DIAGNOSIS — I25810 Atherosclerosis of coronary artery bypass graft(s) without angina pectoris: Secondary | ICD-10-CM | POA: Diagnosis not present

## 2016-08-10 DIAGNOSIS — I482 Chronic atrial fibrillation: Secondary | ICD-10-CM | POA: Diagnosis not present

## 2016-08-10 DIAGNOSIS — R635 Abnormal weight gain: Secondary | ICD-10-CM | POA: Diagnosis not present

## 2016-08-10 DIAGNOSIS — I5033 Acute on chronic diastolic (congestive) heart failure: Secondary | ICD-10-CM | POA: Diagnosis not present

## 2016-08-12 DIAGNOSIS — H9121 Sudden idiopathic hearing loss, right ear: Secondary | ICD-10-CM | POA: Diagnosis not present

## 2016-08-12 DIAGNOSIS — H903 Sensorineural hearing loss, bilateral: Secondary | ICD-10-CM | POA: Diagnosis not present

## 2016-08-18 ENCOUNTER — Encounter: Payer: Self-pay | Admitting: Family Medicine

## 2016-08-21 ENCOUNTER — Other Ambulatory Visit: Payer: Self-pay | Admitting: Family Medicine

## 2016-08-21 MED ORDER — OXYCODONE HCL 15 MG PO TABS: 15.0000 mg | ORAL_TABLET | Freq: Four times a day (QID) | ORAL | 0 refills | Status: DC | PRN

## 2016-08-21 NOTE — Telephone Encounter (Signed)
Pt needs refill on his   oxyCODONE (ROXICODONE) 15 MG immediate release tablet  Taking 07/21/16 -- Birdie Sons, MD   Take 1 tablet (15 mg total) by mouth      He runs out Sunday  Thanks Con Memos

## 2016-08-21 NOTE — Telephone Encounter (Signed)
Will print refill for pick up at the front desk. Next refill will be in a month at Dr. Maralyn Sago discretion.

## 2016-08-24 ENCOUNTER — Other Ambulatory Visit: Payer: Self-pay | Admitting: Otolaryngology

## 2016-08-24 DIAGNOSIS — H9121 Sudden idiopathic hearing loss, right ear: Secondary | ICD-10-CM

## 2016-09-02 ENCOUNTER — Ambulatory Visit
Admission: RE | Admit: 2016-09-02 | Discharge: 2016-09-02 | Disposition: A | Discharge status: Home / Self Care | Payer: Medicare Other | Source: Ambulatory Visit | Attending: Otolaryngology | Admitting: Otolaryngology

## 2016-09-02 DIAGNOSIS — G3189 Other specified degenerative diseases of nervous system: Secondary | ICD-10-CM | POA: Diagnosis not present

## 2016-09-02 DIAGNOSIS — G458 Other transient cerebral ischemic attacks and related syndromes: Secondary | ICD-10-CM | POA: Insufficient documentation

## 2016-09-02 DIAGNOSIS — H9121 Sudden idiopathic hearing loss, right ear: Secondary | ICD-10-CM | POA: Diagnosis not present

## 2016-09-02 DIAGNOSIS — H9191 Unspecified hearing loss, right ear: Secondary | ICD-10-CM | POA: Diagnosis not present

## 2016-09-02 LAB — POCT I-STAT CREATININE: Creatinine, Ser: 1.8 mg/dL — ABNORMAL HIGH (ref 0.61–1.24)

## 2016-09-02 MED ORDER — GADOBENATE DIMEGLUMINE 529 MG/ML IV SOLN
10.0000 mL | Freq: Once | INTRAVENOUS | Status: AC | PRN
  Administered 2016-09-02: 10 mL via INTRAVENOUS

## 2016-09-03 DIAGNOSIS — H90A21 Sensorineural hearing loss, unilateral, right ear, with restricted hearing on the contralateral side: Secondary | ICD-10-CM | POA: Diagnosis not present

## 2016-09-03 DIAGNOSIS — H90A11 Conductive hearing loss, unilateral, right ear with restricted hearing on the contralateral side: Secondary | ICD-10-CM | POA: Diagnosis not present

## 2016-09-03 DIAGNOSIS — H9121 Sudden idiopathic hearing loss, right ear: Secondary | ICD-10-CM | POA: Diagnosis not present

## 2016-09-24 ENCOUNTER — Other Ambulatory Visit: Payer: Self-pay | Admitting: Family Medicine

## 2016-09-24 MED ORDER — OXYCODONE HCL 15 MG PO TABS: 15.0000 mg | ORAL_TABLET | Freq: Four times a day (QID) | ORAL | 0 refills | Status: DC | PRN

## 2016-09-24 NOTE — Telephone Encounter (Signed)
Pt needs refill  oxyCODONE (ROXICODONE) 15 MG immediate release tablet  Thanks teri

## 2016-10-05 DIAGNOSIS — H90A21 Sensorineural hearing loss, unilateral, right ear, with restricted hearing on the contralateral side: Secondary | ICD-10-CM | POA: Diagnosis not present

## 2016-10-05 DIAGNOSIS — H9120 Sudden idiopathic hearing loss, unspecified ear: Secondary | ICD-10-CM | POA: Diagnosis not present

## 2016-10-05 DIAGNOSIS — H903 Sensorineural hearing loss, bilateral: Secondary | ICD-10-CM | POA: Diagnosis not present

## 2016-10-07 DIAGNOSIS — I071 Rheumatic tricuspid insufficiency: Secondary | ICD-10-CM | POA: Diagnosis not present

## 2016-10-07 DIAGNOSIS — I25810 Atherosclerosis of coronary artery bypass graft(s) without angina pectoris: Secondary | ICD-10-CM | POA: Diagnosis not present

## 2016-10-07 DIAGNOSIS — R600 Localized edema: Secondary | ICD-10-CM | POA: Diagnosis not present

## 2016-10-07 DIAGNOSIS — N183 Chronic kidney disease, stage 3 (moderate): Secondary | ICD-10-CM | POA: Diagnosis not present

## 2016-10-07 DIAGNOSIS — R42 Dizziness and giddiness: Secondary | ICD-10-CM | POA: Diagnosis not present

## 2016-10-14 ENCOUNTER — Ambulatory Visit (INDEPENDENT_AMBULATORY_CARE_PROVIDER_SITE_OTHER): Payer: Medicare Other

## 2016-10-14 ENCOUNTER — Ambulatory Visit (INDEPENDENT_AMBULATORY_CARE_PROVIDER_SITE_OTHER): Payer: Medicare Other | Admitting: Family Medicine

## 2016-10-14 VITALS — BP 128/56 | HR 80 | Temp 97.9°F | Ht 71.0 in | Wt 222.0 lb

## 2016-10-14 DIAGNOSIS — I5033 Acute on chronic diastolic (congestive) heart failure: Secondary | ICD-10-CM | POA: Diagnosis not present

## 2016-10-14 DIAGNOSIS — J438 Other emphysema: Secondary | ICD-10-CM

## 2016-10-14 DIAGNOSIS — I10 Essential (primary) hypertension: Secondary | ICD-10-CM | POA: Diagnosis not present

## 2016-10-14 DIAGNOSIS — E876 Hypokalemia: Secondary | ICD-10-CM

## 2016-10-14 DIAGNOSIS — K7689 Other specified diseases of liver: Secondary | ICD-10-CM

## 2016-10-14 DIAGNOSIS — N183 Chronic kidney disease, stage 3 unspecified: Secondary | ICD-10-CM

## 2016-10-14 DIAGNOSIS — Z0000 Encounter for general adult medical examination without abnormal findings: Secondary | ICD-10-CM

## 2016-10-14 NOTE — Progress Notes (Signed)
Patient: Vincent Mason Male    DOB: 1936/04/05   81 y.o.   MRN: [id number] Visit Date: 10/14/2016  Today's Provider: Lelon Huh, MD   Chief Complaint  Patient presents with  . Follow-up  . Anemia  . Back Pain   Subjective:    HPI Follow up of Back pain:  Current treatment includes Oxycodone. Patient reports good compliance with treatment, good tolerance and good symptom control.   Follow up of Anemia:  Patient was last seen for this problem 4 months ago and no changes were made.   Follow up of Hypomagnesemia:  Patient was last seen for this problem  3 months ago. Labs were checked and no changes were made.   Follow up of Hypokalemia:  Patient was last seen for this problem 3 months ago and no changes were made.   He is still having a lot of trouble with swelling in both legs. He had to double his diuretics recently. He does note consistently elevate legs and doesn't wear compression stockings because they are uncomfortable.    Allergies  Allergen Reactions  . Decongestant  [Oxymetazoline]      Current Outpatient Prescriptions:  .  allopurinol (ZYLOPRIM) 100 MG tablet, Reported on 12/12/2015, Disp: , Rfl:  .  benazepril (LOTENSIN) 20 MG tablet, Take 1 tablet by mouth daily. Reported on 12/12/2015, Disp: , Rfl:  .  colchicine 0.6 MG tablet, TAKE ONE (1) TABLET EACH DAY AS NEEDED FOR GOUT, Disp: 30 tablet, Rfl: 5 .  DIGOX 125 MCG tablet, , Disp: , Rfl:  .  Digoxin 62.5 MCG TABS, Take 0.0625 mg by mouth daily., Disp: , Rfl:  .  famotidine (PEPCID) 20 MG tablet, Take 1 tablet (20 mg total) by mouth daily., Disp: , Rfl:  .  gabapentin (NEURONTIN) 100 MG capsule, Take 1 capsule (100 mg total) by mouth 3 (three) times daily., Disp: , Rfl:  .  hydrocortisone-pramoxine (PROCTOFOAM-HC) rectal foam, Place 1 applicator rectally 2 (two) times daily., Disp: 10 g, Rfl: 0 .  ipratropium-albuterol (DUONEB) 0.5-2.5 (3) MG/3ML SOLN, Take 3 mLs by nebulization every 6 (six) hours as  needed., Disp: 360 mL, Rfl:  .  lidocaine (XYLOCAINE) 2 % solution, Use as directed 15 mLs in the mouth or throat every 3 (three) hours as needed for mouth pain., Disp: 100 mL, Rfl: 0 .  Maltodextrin-Xanthan Gum (RESOURCE THICKENUP CLEAR) POWD, Nectar thicken consistency, Disp: , Rfl:  .  metolazone (ZAROXOLYN) 5 MG tablet, TAKE ONE TO TWO TABLETS DAILY AS NEEDEDFOR SWELLING, Disp: 60 tablet, Rfl: 6 .  metoprolol succinate (TOPROL-XL) 100 MG 24 hr tablet, Take 100 mg by mouth daily. Take with or immediately following a meal., Disp: , Rfl:  .  OXcarbazepine (TRILEPTAL) 150 MG tablet, Take 1 tablet (150 mg total) by mouth 2 (two) times daily. Prescribed by Dr. Melrose Nakayama, Disp: 1 tablet, Rfl: 1 .  oxyCODONE (ROXICODONE) 15 MG immediate release tablet, Take 1 tablet (15 mg total) by mouth every 6 (six) hours as needed for pain., Disp: 120 tablet, Rfl: 0 .  potassium chloride SA (K-DUR,KLOR-CON) 20 MEQ tablet, Take 1 tablet (20 mEq total) by mouth 2 (two) times daily., Disp: 60 tablet, Rfl: 5 .  pramipexole (MIRAPEX) 0.25 MG tablet, TAKE ONE TO TWO TABLETS BY MOUTH AT BEDTIME AS NEEDED FOR RESTLESS LEG, Disp: 60 tablet, Rfl: 5 .  ranitidine (ZANTAC) 150 MG tablet, Take 1 tablet by mouth 2 (two) times daily as needed. Reported on  12/12/2015, Disp: , Rfl:  .  rivaroxaban (XARELTO) 20 MG TABS tablet, Take 1 tablet (20 mg total) by mouth daily with supper., Disp: 30 tablet, Rfl: 6 .  rOPINIRole (REQUIP) 4 MG tablet, TAKE ONE TABLET BY MOUTH EVERY NIGHT AT BEDTIME FOR RESTLESS LEG SYNDROME, Disp: 30 tablet, Rfl: 11 .  rosuvastatin (CRESTOR) 40 MG tablet, Take 1 tablet (40 mg total) by mouth daily., Disp: 30 tablet, Rfl: 12 .  sildenafil (REVATIO) 20 MG tablet, Take 1 tablet (20 mg total) by mouth 3 (three) times daily., Disp: 90 tablet, Rfl: 12 .  torsemide (DEMADEX) 20 MG tablet, 2 tablets once or twice a day for swelling (Patient taking differently: Take 40 mg by mouth 2 (two) times daily. ), Disp: 1 tablet, Rfl:  1 .  ULORIC 80 MG TABS, TAKE ONE (1) TABLET EACH DAY, Disp: 30 each, Rfl: 12 .  zolpidem (AMBIEN) 5 MG tablet, Take 1 tablet (5 mg total) by mouth at bedtime. Reported on 07/08/2015, Disp: 30 tablet, Rfl: 0  Review of Systems  Constitutional: Positive for activity change, fatigue and unexpected weight change. Negative for appetite change, chills and fever.  HENT: Positive for hearing loss. Negative for congestion, ear pain, nosebleeds and trouble swallowing.   Eyes: Negative for pain and visual disturbance.  Respiratory: Positive for shortness of breath. Negative for cough and chest tightness.   Cardiovascular: Negative for chest pain, palpitations and leg swelling.  Gastrointestinal: Negative for abdominal pain, blood in stool, constipation, diarrhea, nausea and vomiting.  Endocrine: Negative for polydipsia, polyphagia and polyuria.  Genitourinary: Negative for dysuria and flank pain.  Musculoskeletal: Negative for arthralgias, back pain, joint swelling, myalgias and neck stiffness.  Skin: Negative for color change, rash and wound.  Neurological: Negative for dizziness, tremors, seizures, speech difficulty, weakness, light-headedness and headaches.  Psychiatric/Behavioral: Negative for behavioral problems, confusion, decreased concentration, dysphoric mood and sleep disturbance. The patient is not nervous/anxious.   All other systems reviewed and are negative.   Social History  Substance Use Topics  . Smoking status: Former Research scientist (life sciences)  . Smokeless tobacco: Never Used     Comment: Quit in 1990  . Alcohol use No   Objective:    Vital Signs - Last Recorded  Most recent update: 10/14/2016 9:24 AM by Fabio Neighbors, LPN  BP    427/06 (BP Location: Right Arm)     Pulse  80     Temp  97.9 F (36.6 C) (Oral)     Ht  5\' 11"  (1.803 m)     Wt  222 lb (100.7 kg)      BMI  30.96 kg/m         Physical Exam   General Appearance:    Alert, cooperative, no distress  Eyes:     PERRL, conjunctiva/corneas clear, EOM's intact       Lungs:     Clear to auscultation bilaterally, respirations unlabored  Heart:    Regular rate and rhythm  Neurologic:   Awake, alert, oriented x 3. No apparent focal neurological           defect.   Ext:  3+ bipedal edema.        Assessment & Plan:     1. Hypomagnesemia  - Magnesium  2. Hypokalemia   3. Chronic kidney disease (CKD), stage III (moderate)  - CBC  4. Nodular hyperplasia of liver  - Comprehensive metabolic panel  5. Other emphysema ([place]) Stable   6. Acute on chronic diastolic CHF (  congestive heart failure), NYHA class 1 ([place]) Continue routine follow up Dr. Nehemiah Massed  7. Essential hypertension Well controlled.  Continue current medications.   - Magnesium  8. Lymphedema  Stressed importance of keeping legs elevated when not ambulating. Reducing sodium intake. Recommend he follow up with Dr. Fabiola Backer recommendation for mechanical compression device.       Lelon Huh, MD  [facility]

## 2016-10-14 NOTE — Patient Instructions (Signed)
Lymphedema Lymphedema is swelling that is caused by the abnormal collection of lymph under the skin. Lymph is fluid from the tissues in your body that travels in the lymphatic system. This system is part of the immune system and includes lymph nodes and lymph vessels. The lymph vessels collect and carry the excess fluid, fats, proteins, and wastes from the tissues of the body to the bloodstream. This system also works to clean and remove bacteria and waste products from the body. Lymphedema occurs when the lymphatic system is blocked. When the lymph vessels or lymph nodes are blocked or damaged, lymph does not drain properly, causing an abnormal buildup of lymph. This leads to swelling in the arms or legs. Lymphedema cannot be cured by medicines, but various methods can be used to help reduce the swelling. What are the causes? There are two types of lymphedema. Primary lymphedema is caused by the absence or abnormality of the lymph vessel at birth. Secondary lymphedema is more common. It occurs when the lymph vessel is damaged or blocked. Common causes of lymph vessel blockage include:  Skin infection, such as cellulitis.  Infection by parasites (filariasis).  Injury.  Cancer.  Radiation therapy.  Formation of scar tissue.  Surgery. What are the signs or symptoms? Symptoms of this condition include:  Swelling of the arm or leg.  A heavy or tight feeling in the arm or leg.  Swelling of the feet, toes, or fingers. Shoes or rings may fit more tightly than before.  Redness of the skin over the affected area.  Limited movement of the affected limb.  Sensitivity to touch or discomfort in the affected limb. How is this diagnosed? This condition may be diagnosed with:  A physical exam.  Medical history.  Bioimpedance spectroscopy. In this test, painless electrical currents are used to measure fluid levels in your body.  Imaging tests, such as:  Lymphoscintigraphy. In this test, a  low dose of a radioactive substance is injected to trace the flow of lymph through the lymph vessels.  MRI.  CT scan.  Duplex ultrasound. This test uses sound waves to produce images of the vessels and the blood flow on a screen.  Lymphangiography. In this test, a contrast dye is injected into the lymph vessel to help show blockages. How is this treated? Treatment for this condition may depend on the cause. Treatment may include:  Exercise. Certain exercises can help fluid move out of the affected limb.  Massage. Gentle massage of the affected limb can help move the fluid out of the area.  Compression. Various methods may be used to apply pressure to the affected limb in order to reduce the swelling.  Wearing compression stockings or sleeves on the affected limb.  Bandaging the affected limb.  Using an external pump that is attached to a sleeve that alternates between applying pressure and releasing pressure.  Surgery. This is usually only done for severe cases. For example, surgery may be done if you have trouble moving the limb or if the swelling does not get better with other treatments. If an underlying condition is causing the lymphedema, treatment for that condition is needed. For example, antibiotic medicines may be used to treat an infection. Follow these instructions at home: Activities   Exercise regularly as directed by your health care provider.  Do not sit with your legs crossed.  When possible, keep the affected limb raised (elevated) above the level of your heart.  Avoid carrying things with an arm that is   affected by lymphedema.  Remember that the affected area is more likely to become injured or infected.  Take these steps to help prevent infection:  Keep the affected area clean and dry.  Protect your skin from cuts. For example, you should use gloves while cooking or gardening. Do not walk barefoot. If you shave the affected area, use an electric  razor. General instructions   Take medicines only as directed by your health care provider.  Eat a healthy diet that includes a lot of fruits and vegetables.  Do not wear tight clothes, shoes, or jewelry.  Do not use heating pads over the affected area.  Avoid having blood pressure checked on the affected limb.  Keep all follow-up visits as directed by your health care provider. This is important. Contact a health care provider if:  You continue to have swelling in your limb.  You have a fever.  You have a cut that does not heal.  You have redness or pain in the affected area.  You have new swelling in your limb that comes on suddenly.  You develop purplish spots or sores (lesions) on your limb. Get help right away if:  You have a skin rash.  You have chills or sweats.  You have shortness of breath. This information is not intended to replace advice given to you by your health care provider. Make sure you discuss any questions you have with your health care provider. Document Released: 03/08/2007 Document Revised: 01/16/2016 Document Reviewed: 04/18/2014 Elsevier Interactive Patient Education  2017 [employer]  

## 2016-10-14 NOTE — Progress Notes (Signed)
Subjective:   Vincent Mason is a 81 y.o. male who presents for Medicare Annual/Subsequent preventive examination.  Review of Systems:  N/A  Cardiac Risk Factors include: advanced age (>47mn, >>24women);dyslipidemia;hypertension;male gender;obesity (BMI >30kg/m2)     Objective:    Vitals: BP (!) 128/56 (BP Location: Right Arm)   Pulse 80   Temp 97.9 F (36.6 C) (Oral)   Ht _0  (1.803 m)   Wt 222 lb (100.7 kg)   BMI 30.96 kg/m   Body mass index is 30.96 kg/m.  Tobacco History  Smoking Status  . Former Smoker  Smokeless Tobacco  . Never Used    Comment: Quit in 1990     Counseling given: Not Answered   Past Medical History:  Diagnosis Date  . Acute encephalopathy 09/03/2015  . Atrial fibrillation and flutter ([facility]   [facility] [place]   . C. difficile colitis 09/12/2015  . COPD (ch[place] obstructive pulmonary disease) ([place]   . HCAP (healthcare-associated pneumonia) 09/12/2015  . Hyperlipidemia   . Hypertension   . Kidney disease, chronic, stage III (moderate, EGFR 30-59 ml/min)    ARF 2013  . OSA (obstructive sleep apnea)    non-compliant with CPAP.   .Marland KitchenSevere sepsis with septic shock ([place] 09/12/2015   Past Surgical History:  Procedure Laterality Date  . CORONARY ARTERY BYPASS GRAFT  10/1997  . history of cervical discectomy  2009   C5-C6  . Hyperplastic colon polyp  02/2002   Sigmoid polyps   History reviewed. No pertinent family history. History  Sexual Activity  . Sexual activity: Not on file    Outpatient Encounter Prescriptions as of 10/14/2016  Medication Sig  . oxyCODONE (ROXICODONE) 15 MG immediate release tablet Take 1 tablet (15 mg total) by mouth every 6 (six) hours as needed for pain.  .Marland Kitchenzolpidem (AMBIEN) 5 MG tablet Take 1 tablet (5 mg total) by mouth at bedtime. Reported on 07/08/2015  . allopurinol (ZYLOPRIM) 100 MG tablet Reported on 12/12/2015  . benazepril (LOTENSIN) 20 MG tablet Take 1 tablet by mouth daily.  Reported on 12/12/2015  . colchicine 0.6 MG tablet TAKE ONE (1) TABLET EACH DAY AS NEEDED FOR GOUT  . DIGOX 125 MCG tablet   . Digoxin 62.5 MCG TABS Take 0.0625 mg by mouth daily.  . famotidine (PEPCID) 20 MG tablet Take 1 tablet (20 mg total) by mouth daily.  .Marland Kitchengabapentin (NEURONTIN) 100 MG capsule Take 1 capsule (100 mg total) by mouth 3 (three) times daily.  . hydrocortisone-pramoxine (PROCTOFOAM-HC) rectal foam Place 1 applicator rectally 2 (two) times daily.  .Marland Kitchenipratropium-albuterol (DUONEB) 0.5-2.5 (3) MG/3ML SOLN Take 3 mLs by nebulization every 6 (six) hours as needed.  . lidocaine (XYLOCAINE) 2 % solution Use as directed 15 mLs in the mouth or throat every 3 (three) hours as needed for mouth pain.  . Maltodextrin-Xanthan Gum (RESOURCE THICKENUP CLEAR) POWD Nectar thicken consistency  . metolazone (ZAROXOLYN) 5 MG tablet TAKE ONE TO TWO TABLETS DAILY AS NEEDEDFOR SWELLING  . metoprolol succinate (TOPROL-XL) 100 MG 24 hr tablet Take 100 mg by mouth daily. Take with or immediately following a meal.  . OXcarbazepine (TRILEPTAL) 150 MG tablet Take 1 tablet (150 mg total) by mouth 2 (two) times daily. Prescribed by Dr. PMelrose Nakayama . potassium chloride SA (K-DUR,KLOR-CON) 20 MEQ tablet Take 1 tablet (20 mEq total) by mouth 2 (two) times daily.  . pramipexole (MIRAPEX) 0.25 MG tablet TAKE ONE TO TWO TABLETS  BY MOUTH AT BEDTIME AS NEEDED FOR RESTLESS LEG  . ranitidine (ZANTAC) 150 MG tablet Take 1 tablet by mouth 2 (two) times daily as needed. Reported on 12/12/2015  . rivaroxaban (XARELTO) 20 MG TABS tablet Take 1 tablet (20 mg total) by mouth daily with supper.  Marland Kitchen rOPINIRole (REQUIP) 4 MG tablet TAKE ONE TABLET BY MOUTH EVERY NIGHT AT BEDTIME FOR RESTLESS LEG SYNDROME  . rosuvastatin (CRESTOR) 40 MG tablet Take 1 tablet (40 mg total) by mouth daily.  . sildenafil (REVATIO) 20 MG tablet Take 1 tablet (20 mg total) by mouth 3 (three) times daily.  Marland Kitchen torsemide (DEMADEX) 20 MG tablet 2 tablets once or  twice a day for swelling (Patient taking differently: Take 40 mg by mouth 2 (two) times daily. )  . ULORIC 80 MG TABS TAKE ONE (1) TABLET EACH DAY   No facility-administered encounter medications on file as of 10/14/2016.     Activities of Daily Living In your present state of health, do you have any difficulty performing the following activities: 10/14/2016  Hearing? Y  Vision? N  Difficulty concentrating or making decisions? N  Walking or climbing stairs? Y  Dressing or bathing? N  Doing errands, shopping? N  Preparing Food and eating ? N  Using the Toilet? N  In the past six months, have you accidently leaked urine? Y  Do you have problems with loss of bowel control? N  Managing your Medications? N  Managing your Finances? N  Housekeeping or managing your Housekeeping? N  Some recent data might be hidden    Patient Care Team: Birdie Sons, MD as PCP - General (Family Medicine) Corey Skains, MD as Consulting Physician (Internal Medicine) Estill Cotta, MD as Consulting Physician (Ophthalmology) Clyde Canterbury, MD as Referring Physician (Otolaryngology)   Assessment:     Exercise Activities and Dietary recommendations Current Exercise Habits: The patient does not participate in regular exercise at present, Exercise limited by: None identified  Goals    . diet          Recommend cutting down on portion size with daily meals.       Fall Risk Fall Risk  10/14/2016  Falls in the past year? Yes  Number falls in past yr: 1  Injury with Fall? No  Follow up Falls prevention discussed   Depression Screen PHQ 2/9 Scores 10/14/2016 10/14/2016  PHQ - 2 Score 0 0  PHQ- 9 Score 1 -    Cognitive Function     6CIT Screen 10/14/2016  What Year? 0 points  What month? 0 points  What time? 0 points  Count back from 20 0 points  Months in reverse 2 points  Repeat phrase 6 points  Total Score 8    Immunization History  Administered Date(s) Administered  .  Influenza, High Dose Seasonal PF 06/17/2016  . Pneumococcal Conjugate-13 02/06/2014  . Pneumococcal Polysaccharide-23 03/19/2003  . Zoster 02/06/2014   Screening Tests Health Maintenance  Topic Date Due  . TETANUS/TDAP  06/23/1954  . INFLUENZA VACCINE  12/23/2016  . PNA vac Low Risk Adult  Completed      Plan:  I have personally reviewed and addressed the Medicare Annual Wellness questionnaire and have noted the following in the patient's chart:  A. Medical and social history B. Use of alcohol, tobacco or illicit drugs  C. Current medications and supplements D. Functional ability and status E.  Nutritional status F.  Physical activity G. Advance directives H. List of other  physicians I.  Hospitalizations, surgeries, and ER visits in previous 12 months J.  [place] such as hearing and vision if needed, cognitive and depression L. Referrals and appointments - none  In addition, I have reviewed and discussed with patient certain preventive protocols, quality metrics, and best practice recommendations. A written personalized care plan for preventive services as well as general preventive health recommendations were provided to patient.  See attached scanned questionnaire for additional information.   Signed,  Fabio Neighbors, LPN Nurse Health Advisor   MD Recommendations: None. Pt declined tetanus vaccine.   I have reviewed the health advisor's note, was available for consultation, and agree with documentation and plan  Lelon Huh, MD

## 2016-10-14 NOTE — Patient Instructions (Signed)
Mr. Vincent Mason , Thank you for taking time to come for your Medicare Wellness Visit. I appreciate your ongoing commitment to your health goals. Please review the following plan we discussed and let me know if I can assist you in the future.   Screening recommendations/referrals: Colonoscopy: completed 08/15/12 Recommended yearly ophthalmology/optometry visit for glaucoma screening and checkup Recommended yearly dental visit for hygiene and checkup  Vaccinations: Influenza vaccine: up to date, due 01/2017 Pneumococcal vaccine: completed series Tdap vaccine: declined Shingles vaccine: completed 02/06/14   Advanced directives: Advance directive discussed with you today. I have provided a copy for you to complete at home and have notarized. Once this is complete please bring a copy in to our office so we can scan it into your chart.  Conditions/risks identified: Fall risk prevention; Obesity- Recommend cutting down on portion sizes.  Next appointment: None, need to schedule 1 year AWV.  Preventive Care 90 Years and Older, Male Preventive care refers to lifestyle choices and visits with your health care provider that can promote health and wellness. What does preventive care include?  A yearly physical exam. This is also called an annual well check.  Dental exams once or twice a year.  Routine eye exams. Ask your health care provider how often you should have your eyes checked.  Personal lifestyle choices, including:  Daily care of your teeth and gums.  Regular physical activity.  Eating a healthy diet.  Avoiding tobacco and drug use.  Limiting alcohol use.  Practicing safe sex.  Taking low doses of aspirin every day.  Taking vitamin and mineral supplements as recommended by your health care provider. What happens during an annual well check? The services and screenings done by your health care provider during your annual well check will depend on your age, overall health,  lifestyle risk factors, and family history of disease. Counseling  Your health care provider may ask you questions about your:  Alcohol use.  Tobacco use.  Drug use.  Emotional well-being.  Home and relationship well-being.  Sexual activity.  Eating habits.  History of falls.  Memory and ability to understand (cognition).  Work and work Statistician. Screening  You may have the following tests or measurements:  Height, weight, and BMI.  Blood pressure.  Lipid and cholesterol levels. These may be checked every 5 years, or more frequently if you are over 2 years old.  Skin check.  Lung cancer screening. You may have this screening every year starting at age 27 if you have a 30-pack-year history of smoking and currently smoke or have quit within the past 15 years.  Fecal occult blood test (FOBT) of the stool. You may have this test every year starting at age 73.  Flexible sigmoidoscopy or colonoscopy. You may have a sigmoidoscopy every 5 years or a colonoscopy every 10 years starting at age 62.  Prostate cancer screening. Recommendations will vary depending on your family history and other risks.  Hepatitis C blood test.  Hepatitis B blood test.  Sexually transmitted disease (STD) testing.  Diabetes screening. This is done by checking your blood sugar (glucose) after you have not eaten for a while (fasting). You may have this done every 1-3 years.  Abdominal aortic aneurysm (AAA) screening. You may need this if you are a current or former smoker.  Osteoporosis. You may be screened starting at age 53 if you are at high risk. Talk with your health care provider about your test results, treatment options, and if necessary, the  need for more tests. Vaccines  Your health care provider may recommend certain vaccines, such as:  Influenza vaccine. This is recommended every year.  Tetanus, diphtheria, and acellular pertussis (Tdap, Td) vaccine. You may need a Td booster  every 10 years.  Zoster vaccine. You may need this after age 2.  Pneumococcal 13-valent conjugate (PCV13) vaccine. One dose is recommended after age 49.  Pneumococcal polysaccharide (PPSV23) vaccine. One dose is recommended after age 64. Talk to your health care provider about which screenings and vaccines you need and how often you need them. This information is not intended to replace advice given to you by your health care provider. Make sure you discuss any questions you have with your health care provider. Document Released: 06/07/2015 Document Revised: 01/29/2016 Document Reviewed: 03/12/2015 Elsevier Interactive Patient Education  2017 [place] Prevention in the Home Falls can cause injuries. They can happen to people of all ages. There are many things you can do to make your home safe and to help prevent falls. What can I do on the outside of my home?  Regularly fix the edges of walkways and driveways and fix any cracks.  Remove anything that might make you trip as you walk through a door, such as a raised step or threshold.  Trim any bushes or trees on the path to your home.  Use bright outdoor lighting.  Clear any walking paths of anything that might make someone trip, such as rocks or tools.  Regularly check to see if handrails are loose or broken. Make sure that both sides of any steps have handrails.  Any raised decks and porches should have guardrails on the edges.  Have any leaves, snow, or ice cleared regularly.  Use sand or salt on walking paths during winter.  Clean up any spills in your garage right away. This includes oil or grease spills. What can I do in the bathroom?  Use night lights.  Install grab bars by the toilet and in the tub and shower. Do not use towel bars as grab bars.  Use non-skid mats or decals in the tub or shower.  If you need to sit down in the shower, use a plastic, non-slip stool.  Keep the floor dry. Clean up any  water that spills on the floor as soon as it happens.  Remove soap buildup in the tub or shower regularly.  Attach bath mats securely with double-sided non-slip rug tape.  Do not have throw rugs and other things on the floor that can make you trip. What can I do in the bedroom?  Use night lights.  Make sure that you have a light by your bed that is easy to reach.  Do not use any sheets or blankets that are too big for your bed. They should not hang down onto the floor.  Have a firm chair that has side arms. You can use this for support while you get dressed.  Do not have throw rugs and other things on the floor that can make you trip. What can I do in the kitchen?  Clean up any spills right away.  Avoid walking on wet floors.  Keep items that you use a lot in easy-to-reach places.  If you need to reach something above you, use a strong step stool that has a grab bar.  Keep electrical cords out of the way.  Do not use floor polish or wax that makes floors slippery. If you must use wax,  use non-skid floor wax.  Do not have throw rugs and other things on the floor that can make you trip. What can I do with my stairs?  Do not leave any items on the stairs.  Make sure that there are handrails on both sides of the stairs and use them. Fix handrails that are broken or loose. Make sure that handrails are as long as the stairways.  Check any carpeting to make sure that it is firmly attached to the stairs. Fix any carpet that is loose or worn.  Avoid having throw rugs at the top or bottom of the stairs. If you do have throw rugs, attach them to the floor with carpet tape.  Make sure that you have a light switch at the top of the stairs and the bottom of the stairs. If you do not have them, ask someone to add them for you. What else can I do to help prevent falls?  Wear shoes that:  Do not have high heels.  Have rubber bottoms.  Are comfortable and fit you well.  Are closed  at the toe. Do not wear sandals.  If you use a stepladder:  Make sure that it is fully opened. Do not climb a closed stepladder.  Make sure that both sides of the stepladder are locked into place.  Ask someone to hold it for you, if possible.  Clearly mark and make sure that you can see:  Any grab bars or handrails.  First and last steps.  Where the edge of each step is.  Use tools that help you move around (mobility aids) if they are needed. These include:  Canes.  Walkers.  Scooters.  Crutches.  Turn on the lights when you go into a dark area. Replace any light bulbs as soon as they burn out.  Set up your furniture so you have a clear path. Avoid moving your furniture around.  If any of your floors are uneven, fix them.  If there are any pets around you, be aware of where they are.  Review your medicines with your doctor. Some medicines can make you feel dizzy. This can increase your chance of falling. Ask your doctor what other things that you can do to help prevent falls. This information is not intended to replace advice given to you by your health care provider. Make sure you discuss any questions you have with your health care provider. Document Released: 03/07/2009 Document Revised: 10/17/2015 Document Reviewed: 06/15/2014 Elsevier Interactive Patient Education  2017 [employer].

## 2016-10-15 LAB — CBC
Hematocrit: 33.5 % — ABNORMAL LOW (ref 37.5–51.0)
Hemoglobin: 11.2 g/dL — ABNORMAL LOW (ref 13.0–17.7)
MCH: 31.1 pg (ref 26.6–33.0)
MCHC: 33.4 g/dL (ref 31.5–35.7)
MCV: 93 fL (ref 79–97)
Platelets: 268 10*3/uL (ref 150–379)
RBC: 3.6 x10E6/uL — ABNORMAL LOW (ref 4.14–5.80)
RDW: 16.9 % — ABNORMAL HIGH (ref 12.3–15.4)
WBC: 11.3 10*3/uL — ABNORMAL HIGH (ref 3.4–10.8)

## 2016-10-15 LAB — COMPREHENSIVE METABOLIC PANEL
ALT: 21 IU/L (ref 0–44)
AST: 24 IU/L (ref 0–40)
Albumin/Globulin Ratio: 1.8 (ref 1.2–2.2)
Albumin: 4.6 g/dL (ref 3.5–4.8)
Alkaline Phosphatase: 53 IU/L (ref 39–117)
BUN/Creatinine Ratio: 15 (ref 10–24)
BUN: 28 mg/dL — ABNORMAL HIGH (ref 8–27)
Bilirubin Total: 0.5 mg/dL (ref 0.0–1.2)
CO2: 29 mmol/L (ref 18–29)
Calcium: 9.2 mg/dL (ref 8.6–10.2)
Chloride: 93 mmol/L — ABNORMAL LOW (ref 96–106)
Creatinine, Ser: 1.85 mg/dL — ABNORMAL HIGH (ref 0.76–1.27)
GFR calc Af Amer: 39 mL/min/{1.73_m2} — ABNORMAL LOW (ref >59)
GFR calc non Af Amer: 34 mL/min/{1.73_m2} — ABNORMAL LOW (ref >59)
Globulin, Total: 2.5 g/dL (ref 1.5–4.5)
Glucose: 152 mg/dL — ABNORMAL HIGH (ref 65–99)
Potassium: 3 mmol/L — ABNORMAL LOW (ref 3.5–5.2)
Sodium: 141 mmol/L (ref 134–144)
Total Protein: 7.1 g/dL (ref 6.0–8.5)

## 2016-10-15 LAB — MAGNESIUM: Magnesium: 1.6 mg/dL (ref 1.6–2.3)

## 2016-10-26 ENCOUNTER — Other Ambulatory Visit: Payer: Self-pay | Admitting: Family Medicine

## 2016-10-26 MED ORDER — OXYCODONE HCL 15 MG PO TABS
15.0000 mg | ORAL_TABLET | Freq: Four times a day (QID) | ORAL | 0 refills | Status: DC | PRN
Start: 2016-10-26 — End: 2016-12-10

## 2016-10-26 NOTE — Telephone Encounter (Signed)
Pt contacted office for refill request on the following medications: oxyCODONE (ROXICODONE) 15 MG immediate release tablet Last Rx: 09/24/16 LOV: 10/14/16 Please advise. Thanks TNP

## 2016-10-27 ENCOUNTER — Telehealth: Payer: Self-pay | Admitting: *Deleted

## 2016-10-27 DIAGNOSIS — I890 Lymphedema, not elsewhere classified: Secondary | ICD-10-CM

## 2016-10-27 NOTE — Telephone Encounter (Signed)
Wife advised as below and wants to proceed with referral and order put in-aa

## 2016-10-27 NOTE — Telephone Encounter (Signed)
The only thing I can do is increase his diuretics, but that will make him dizzy. Recommend referral to vascular surgery for chronic lymphedema.

## 2016-10-27 NOTE — Telephone Encounter (Signed)
lmtcb for [employer], wife-aa

## 2016-10-27 NOTE — Telephone Encounter (Signed)
Patient's wife [name] came to office requesting to speak to nurse concerning pt's leg swelling. [name] stated that pt's legs have been swollen and red for 2 weeks with blisters on lower legs. [name] stated that pt is having trouble ambulating. [name] wants to know what they should do? Renato Gails that pt needs to be seen by a provider. [name] was reluctant about appt and stated that she came to office without pt's knowledge but she is concerned about his legs. Please advise?

## 2016-11-10 ENCOUNTER — Encounter (INDEPENDENT_AMBULATORY_CARE_PROVIDER_SITE_OTHER): Payer: Self-pay | Admitting: Vascular Surgery

## 2016-11-10 ENCOUNTER — Telehealth (INDEPENDENT_AMBULATORY_CARE_PROVIDER_SITE_OTHER): Payer: Self-pay

## 2016-11-10 ENCOUNTER — Ambulatory Visit (INDEPENDENT_AMBULATORY_CARE_PROVIDER_SITE_OTHER): Payer: Medicare Other | Admitting: Vascular Surgery

## 2016-11-10 ENCOUNTER — Telehealth: Payer: Self-pay | Admitting: Family Medicine

## 2016-11-10 VITALS — BP 145/83 | HR 112 | Resp 18 | Ht 70.0 in | Wt 228.0 lb

## 2016-11-10 DIAGNOSIS — I482 Chronic atrial fibrillation, unspecified: Secondary | ICD-10-CM

## 2016-11-10 DIAGNOSIS — N183 Chronic kidney disease, stage 3 unspecified: Secondary | ICD-10-CM

## 2016-11-10 DIAGNOSIS — E785 Hyperlipidemia, unspecified: Secondary | ICD-10-CM | POA: Diagnosis not present

## 2016-11-10 DIAGNOSIS — I10 Essential (primary) hypertension: Secondary | ICD-10-CM | POA: Diagnosis not present

## 2016-11-10 DIAGNOSIS — M7989 Other specified soft tissue disorders: Secondary | ICD-10-CM

## 2016-11-10 DIAGNOSIS — I890 Lymphedema, not elsewhere classified: Secondary | ICD-10-CM

## 2016-11-10 NOTE — Assessment & Plan Note (Signed)
Very prominent. We'll place in [place] boots today to try to get this under control. A venous reflux study will be performed once the swelling is more manageable. He will eventually need compression stockings and I believe he has lymphedema from chronic scarring lymphatic channels. For this, a lymphedema pump would be a excellent adjuvant therapy to try to help keep the swelling under control. Leg elevation, exercise, and other conservative measures are also very important to continuing improvement of the leg swelling.

## 2016-11-10 NOTE — Assessment & Plan Note (Signed)
On anticoagulation. Poor cardiac function is almost certainly a major contributing factor to his lower extremity swelling.

## 2016-11-10 NOTE — Telephone Encounter (Signed)
The telephone message from Dr. Bunnie Domino office is not really a recommendation one way or the other. It looks like Dr. Lucky Cowboy thinks the patient could stop these meds if he wanted to, but he doesn't have too. I don't want his lungs to accumulating fluid again, so I would suggest he continue the torsemide for the time being, and stop the [employer]

## 2016-11-10 NOTE — Patient Instructions (Signed)

## 2016-11-10 NOTE — Progress Notes (Signed)
Patient ID: Vincent Mason, male   DOB: March 02, 1936, 81 y.o.   MRN: [id number]  Chief Complaint  Patient presents with  . New Evaluation    Lymph issue    HPI Vincent Mason is a 81 y.o. male.  I am asked to see the patient by Dr. Caryn Section for evaluation of Marked leg swelling.  The patient reports chronic leg swelling bilaterally that has continued to progress to the point where he now has ulcerations on both legs and has gained 20-30 pounds of fluid in his legs. He has been on diuretics in the past with some improvement but now even with a higher dose of diuretics this has not helped. The swelling has been progressive. He is on anticoagulation for atrial fibrillation and sees a cardiologist for this. No trauma or injury that open the wounds. He started noticing leg swelling about a year and a half ago when he was hospitalized for several weeks. Following this, his leg swelling has been gradually progressing. The left leg is the more severely affected of the 2 legs. He denies fever or chills.   Past Medical History:  Diagnosis Date  . Acute encephalopathy 09/03/2015  . Atrial fibrillation and flutter [facility]) 2013   Dr Nehemiah Massed at [facility]  on [address].   . C. difficile colitis 09/12/2015  . COPD (chronic obstructive pulmonary disease) ([place])   . HCAP (healthcare-associated pneumonia) 09/12/2015  . Hyperlipidemia   . Hypertension   . Kidney disease, chronic, stage III (moderate, EGFR 30-59 ml/min)    ARF 2013  . OSA (obstructive sleep apnea)    non-compliant with CPAP.   Marland Kitchen Severe sepsis with septic shock ([place]) 09/12/2015    Past Surgical History:  Procedure Laterality Date  . CORONARY ARTERY BYPASS GRAFT  10/1997  . history of cervical discectomy  2009   C5-C6  . Hyperplastic colon polyp  02/2002   Sigmoid polyps    Family History No bleeding disorders, clotting disorders, autoimmune diseases, or aneurysms  Social History Social History  Substance Use Topics  . Smoking status: Former  Research scientist (life sciences)  . Smokeless tobacco: Never Used     Comment: Quit in 1990  . Alcohol use No  Married  Allergies  Allergen Reactions  . Decongestant  [Oxymetazoline]     Current Outpatient Prescriptions  Medication Sig Dispense Refill  . DIGOX 125 MCG tablet     . metolazone (ZAROXOLYN) 5 MG tablet TAKE ONE TO TWO TABLETS DAILY AS NEEDEDFOR SWELLING 60 tablet 6  . oxyCODONE (ROXICODONE) 15 MG immediate release tablet Take 1 tablet (15 mg total) by mouth every 6 (six) hours as needed for pain. 120 tablet 0  . potassium chloride SA (K-DUR,KLOR-CON) 20 MEQ tablet Take 1 tablet (20 mEq total) by mouth 2 (two) times daily. 60 tablet 5  . pramipexole (MIRAPEX) 0.25 MG tablet TAKE ONE TO TWO TABLETS BY MOUTH AT BEDTIME AS NEEDED FOR RESTLESS LEG 60 tablet 5  . ranitidine (ZANTAC) 150 MG tablet Take 1 tablet by mouth 2 (two) times daily as needed. Reported on 12/12/2015    . rivaroxaban (XARELTO) 20 MG TABS tablet Take 1 tablet (20 mg total) by mouth daily with supper. 30 tablet 6  . rosuvastatin (CRESTOR) 40 MG tablet Take 1 tablet (40 mg total) by mouth daily. 30 tablet 12  . torsemide (DEMADEX) 20 MG tablet 2 tablets once or twice a day for swelling (Patient taking differently: Take 40 mg by mouth 2 (two) times daily. )  1 tablet 1  . ULORIC 80 MG TABS TAKE ONE (1) TABLET EACH DAY 30 each 12   No current facility-administered medications for this visit.       REVIEW OF SYSTEMS (Negative unless checked)  Constitutional: [] Weight loss  [] Fever  [] Chills Cardiac: [] Chest pain   [] Chest pressure   [x] Palpitations   [] Shortness of breath when laying flat   [] Shortness of breath at rest   [x] Shortness of breath with exertion. Vascular:  [] Pain in legs with walking   [] Pain in legs at rest   [] Pain in legs when laying flat   [] Claudication   [] Pain in feet when walking  [] Pain in feet at rest  [] Pain in feet when laying flat   [] History of DVT   [] Phlebitis   [x] Swelling in legs   [] Varicose veins    [x] Non-healing ulcers Pulmonary:   [] Uses home oxygen   [] Productive cough   [] Hemoptysis   [] Wheeze  [x] COPD   [] Asthma Neurologic:  [] Dizziness  [] Blackouts   [] Seizures   [] History of stroke   [] History of TIA  [] Aphasia   [] Temporary blindness   [] Dysphagia   [] Weakness or numbness in arms   [] Weakness or numbness in legs Musculoskeletal:  [] Arthritis   [] Joint swelling   [] Joint pain   [] Low back pain Hematologic:  [] Easy bruising  [] Easy bleeding   [] Hypercoagulable state   [] Anemic  [] Hepatitis Gastrointestinal:  [] Blood in stool   [] Vomiting blood  [] Gastroesophageal reflux/heartburn   [] Abdominal pain Genitourinary:  [x] Chronic kidney disease   [] Difficult urination  [] Frequent urination  [] Burning with urination   [] Hematuria Skin:  [] Rashes   [x] Ulcers   [x] Wounds Psychological:  [] History of anxiety   []  History of major depression.    Physical Exam BP (!) 145/83 (BP Location: Right Arm)   Pulse (!) 112   Resp 18   Ht 5' 10"  (1.778 m)   Wt 228 lb (103.4 kg)   BMI 32.71 kg/m  Gen:  WD/WN, NAD Head: [place]/AT, No temporalis wasting.  Ear/Nose/Throat: Hearing grossly intact, nares w/o erythema or drainage, oropharynx w/o Erythema/Exudate Eyes: Conjunctiva clear, sclera non-icteric  Neck: trachea midline.  No JVD.  Pulmonary:  Good air movement, respirations not labored, no use of accessory muscles Cardiac: Irregularly irregular Vascular:  Vessel Right Left  Radial Palpable Palpable                          PT Not Palpable Not Palpable  DP Not Palpable Not Palpable   Gastrointestinal: soft, non-tender/non-distended.  Musculoskeletal: M/S 5/5 throughout.  Extremities without ischemic changes.  No deformity or atrophy. 3-4+ BLE edema. Neurologic: Sensation grossly intact in extremities.  Symmetrical.  Speech is fluent. Motor exam as listed above. Psychiatric: Judgment intact, Mood & affect appropriate for pt's clinical situation. Dermatologic: Several superficial  ulcerations on bilateral lower extremities as well as a prominent blister on the left anterior to lateral upper calf area. No erythema. Drainage is serous. Marked stasis dermatitis changes are present bilaterally.    Radiology No results found.  Labs Recent Results (from the past 2160 hour(s))  I-STAT creatinine     Status: Abnormal   Collection Time: 09/02/16  8:57 AM  Result Value Ref Range   Creatinine, Ser 1.80 (H) 0.61 - 1.24 mg/dL  Comprehensive metabolic panel     Status: Abnormal   Collection Time: 10/14/16 10:36 AM  Result Value Ref Range   Glucose 152 (H) 65 - 99 mg/dL  BUN 28 (H) 8 - 27 mg/dL   Creatinine, Ser 1.85 (H) 0.76 - 1.27 mg/dL   GFR calc non Af Amer 34 (L) >59 mL/min/1.73   GFR calc Af Amer 39 (L) >59 mL/min/1.73   BUN/Creatinine Ratio 15 10 - 24   Sodium 141 134 - 144 mmol/L   Potassium 3.0 (L) 3.5 - 5.2 mmol/L   Chloride 93 (L) 96 - 106 mmol/L   CO2 29 18 - 29 mmol/L   Calcium 9.2 8.6 - 10.2 mg/dL   Total Protein 7.1 6.0 - 8.5 g/dL   Albumin 4.6 3.5 - 4.8 g/dL   Globulin, Total 2.5 1.5 - 4.5 g/dL   Albumin/Globulin Ratio 1.8 1.2 - 2.2   Bilirubin Total 0.5 0.0 - 1.2 mg/dL   Alkaline Phosphatase 53 39 - 117 IU/L   AST 24 0 - 40 IU/L   ALT 21 0 - 44 IU/L  CBC     Status: Abnormal   Collection Time: 10/14/16 10:36 AM  Result Value Ref Range   WBC 11.3 (H) 3.4 - 10.8 x10E3/uL   RBC 3.60 (L) 4.14 - 5.80 x10E6/uL   Hemoglobin 11.2 (L) 13.0 - 17.7 g/dL   Hematocrit 33.5 (L) 37.5 - 51.0 %   MCV 93 79 - 97 fL   MCH 31.1 26.6 - 33.0 pg   MCHC 33.4 31.5 - 35.7 g/dL   RDW 16.9 (H) 12.3 - 15.4 %   Platelets 268 150 - 379 x10E3/uL  Magnesium     Status: None   Collection Time: 10/14/16 10:36 AM  Result Value Ref Range   Magnesium 1.6 1.6 - 2.3 mg/dL    Assessment/Plan:  Chronic atrial fibrillation (HCC) On anticoagulation. Poor cardiac function is almost certainly a major contributing factor to his lower extremity swelling.  Essential  hypertension blood pressure control important in reducing the progression of atherosclerotic disease. On appropriate oral medications.   Chronic kidney disease (CKD), stage III (moderate) Renal insufficiency is almost certainly a major contributor factor to his lower extremity chronic swelling.  Leg swelling Very prominent. We'll place in [place] boots today to try to get this under control. A venous reflux study will be performed once the swelling is more manageable. He will eventually need compression stockings and I believe he has lymphedema from chronic scarring lymphatic channels. For this, a lymphedema pump would be a excellent adjuvant therapy to try to help keep the swelling under control. Leg elevation, exercise, and other conservative measures are also very important to continuing improvement of the leg swelling.  Lymphedema of both lower extremities Very prominent. We'll place in [place] boots today to try to get this under control. A venous reflux study will be performed once the swelling is more manageable. He will eventually need compression stockings and I believe he has lymphedema from chronic scarring lymphatic channels. For this, a lymphedema pump would be a excellent adjuvant therapy to try to help keep the swelling under control. Leg elevation, exercise, and other conservative measures are also very important to continuing improvement of the leg swelling.      Leotis Pain 11/10/2016, 3:30 PM   This note was created with Dragon medical transcription system.  Any errors from dictation are unintentional.

## 2016-11-10 NOTE — Assessment & Plan Note (Signed)
Very prominent. We'll place in [place] boots today to try to get this under control. A venous reflux study will be performed once the swelling is more manageable. He will eventually need compression stockings and I believe he has lymphedema from chronic scarring lymphatic channels. For this, a lymphedema pump would be a excellent adjuvant therapy to try to help keep the swelling under control. Leg elevation, exercise, and other conservative measures are also very important to continuing improvement of the leg swelling.

## 2016-11-10 NOTE — Assessment & Plan Note (Signed)
Renal insufficiency is almost certainly a major contributor factor to his lower extremity chronic swelling.

## 2016-11-10 NOTE — Telephone Encounter (Signed)
Please advise 

## 2016-11-10 NOTE — Telephone Encounter (Signed)
Patient had a question on if he should continue taking torsemide and metolazone for his swelling because he started wearing unna boots.I spoke with JD and he advise that he could stop the meds but he should get in contact with the primary care doctor to see if he should continue or stop the medication.

## 2016-11-10 NOTE — Telephone Encounter (Signed)
Pt states he seen Dr Lucky Cowboy today and was advised to stop taking the Rx torsemide (DEMADEX) 20 MG tablet and metolazone (ZAROXOLYN) 5 MG tablet.  Pt is asking if this is ok with Dr Caryn Section.  Please advise. [id number]#[phone]

## 2016-11-10 NOTE — Assessment & Plan Note (Signed)
blood pressure control important in reducing the progression of atherosclerotic disease. On appropriate oral medications.  

## 2016-11-10 NOTE — Telephone Encounter (Signed)
Pt advised and expresses understanding. Will D/C the metolazone for now. Renaldo Fiddler, CMA

## 2016-11-16 ENCOUNTER — Encounter (INDEPENDENT_AMBULATORY_CARE_PROVIDER_SITE_OTHER): Payer: Self-pay | Admitting: Vascular Surgery

## 2016-11-16 ENCOUNTER — Ambulatory Visit (INDEPENDENT_AMBULATORY_CARE_PROVIDER_SITE_OTHER): Payer: Medicare Other | Admitting: Vascular Surgery

## 2016-11-16 VITALS — BP 139/69 | HR 94 | Resp 16

## 2016-11-16 DIAGNOSIS — I890 Lymphedema, not elsewhere classified: Secondary | ICD-10-CM | POA: Diagnosis not present

## 2016-11-16 NOTE — Progress Notes (Signed)
History of Present Illness  There is no documented history at this time  Assessments & Plan   There are no diagnoses linked to this encounter.    Additional instructions  Subjective:  Patient presents with venous ulcer of the Bilateral lower extremity.    Procedure:  3 layer unna wrap was placed Bilateral lower extremity.   Plan:   Follow up in one week.  

## 2016-11-17 ENCOUNTER — Encounter (INDEPENDENT_AMBULATORY_CARE_PROVIDER_SITE_OTHER): Payer: Medicare Other | Admitting: Vascular Surgery

## 2016-11-18 ENCOUNTER — Encounter (INDEPENDENT_AMBULATORY_CARE_PROVIDER_SITE_OTHER): Payer: Medicare Other

## 2016-11-20 ENCOUNTER — Telehealth: Payer: Self-pay | Admitting: Family Medicine

## 2016-11-20 NOTE — Telephone Encounter (Signed)
Advised patient that Rx was ready for pick up.

## 2016-11-20 NOTE — Telephone Encounter (Signed)
Pt need rx for a face mask for his resmed machine.  He can get the face mask at [facility].  He states he needs this now.  He would like the rx today.  He said they told him he would have to have a written rx.   Pt's call back is [phone]  Thanks Con Memos

## 2016-11-20 NOTE — Telephone Encounter (Signed)
Please review. Thanks!  

## 2016-11-20 NOTE — Telephone Encounter (Signed)
Prescription written so he may get it now.

## 2016-11-24 ENCOUNTER — Encounter: Payer: Self-pay | Admitting: Family Medicine

## 2016-11-24 ENCOUNTER — Encounter (INDEPENDENT_AMBULATORY_CARE_PROVIDER_SITE_OTHER): Payer: Self-pay | Admitting: Vascular Surgery

## 2016-11-24 ENCOUNTER — Ambulatory Visit (INDEPENDENT_AMBULATORY_CARE_PROVIDER_SITE_OTHER): Payer: Medicare Other | Admitting: Vascular Surgery

## 2016-11-24 ENCOUNTER — Ambulatory Visit (INDEPENDENT_AMBULATORY_CARE_PROVIDER_SITE_OTHER): Payer: Medicare Other | Admitting: Family Medicine

## 2016-11-24 VITALS — Resp 19 | Ht 69.0 in | Wt 238.0 lb

## 2016-11-24 VITALS — BP 126/78 | HR 88 | Temp 98.3°F | Resp 20 | Wt 238.0 lb

## 2016-11-24 DIAGNOSIS — I5033 Acute on chronic diastolic (congestive) heart failure: Secondary | ICD-10-CM

## 2016-11-24 DIAGNOSIS — I272 Pulmonary hypertension, unspecified: Secondary | ICD-10-CM | POA: Diagnosis not present

## 2016-11-24 DIAGNOSIS — I890 Lymphedema, not elsewhere classified: Secondary | ICD-10-CM

## 2016-11-24 MED ORDER — METOLAZONE 5 MG PO TABS: 20.0000 mg | ORAL_TABLET | Freq: Every day | ORAL | 0 refills | Status: DC

## 2016-11-24 NOTE — Patient Instructions (Addendum)
   Increase metolazone 5mg  from 2 tablets a day to 4 tablets a day for the next week.    Please check to see if you are currently taking metoprolol succinate. Please call my office and leave a message about whether you are taking this or not.    Please bring all of your medications to your next appointment.

## 2016-11-24 NOTE — Progress Notes (Signed)
Patient: Vincent Mason Male    DOB: 1936-03-04   81 y.o.   MRN: [id number] Visit Date: 11/24/2016  Today's Provider: Lelon Huh, MD   Chief Complaint  Patient presents with  . Groin Swelling   Subjective:    HPI Patient comes in today c/o swelling in his groin area. He reports that his testicles have been swollen for 2-3 days. He denies any injuries. He denies any pain. He just has redness and swelling. Patient reports that he has has this before in the past, and it went away on its own. He feels that the swelling is getting worse.  He is seeing Dr. Lucky Cowboy for lymphedema, but he feels more and more bloated. He states he is taking torsemide 20mg  two a day and metolazone 5mg  two a day and has not changed dose lately. He has severe pulmonary hypertension, a-fib and diastolic HF and had been prescribed sildenfil and metoprolol , but both of these medications are off of his medication list and he does not know if he is taking them.   Wt Readings from Last 3 Encounters:  11/24/16 238 lb (108 kg)  11/24/16 238 lb (108 kg)  11/10/16 228 lb (103.4 kg)        Allergies  Allergen Reactions  . Decongestant  [Oxymetazoline]      Current Outpatient Prescriptions:  .  DIGOX 125 MCG tablet, , Disp: , Rfl:  .  metolazone (ZAROXOLYN) 5 MG tablet, TAKE ONE TO TWO TABLETS DAILY AS NEEDEDFOR SWELLING, Disp: 60 tablet, Rfl: 6 .  oxyCODONE (ROXICODONE) 15 MG immediate release tablet, Take 1 tablet (15 mg total) by mouth every 6 (six) hours as needed for pain., Disp: 120 tablet, Rfl: 0 .  potassium chloride SA (K-DUR,KLOR-CON) 20 MEQ tablet, Take 1 tablet (20 mEq total) by mouth 2 (two) times daily., Disp: 60 tablet, Rfl: 5 .  pramipexole (MIRAPEX) 0.25 MG tablet, TAKE ONE TO TWO TABLETS BY MOUTH AT BEDTIME AS NEEDED FOR RESTLESS LEG, Disp: 60 tablet, Rfl: 5 .  ranitidine (ZANTAC) 150 MG tablet, Take 1 tablet by mouth 2 (two) times daily as needed. Reported on 12/12/2015, Disp: , Rfl:  .   rivaroxaban (XARELTO) 20 MG TABS tablet, Take 1 tablet (20 mg total) by mouth daily with supper., Disp: 30 tablet, Rfl: 6 .  rosuvastatin (CRESTOR) 40 MG tablet, Take 1 tablet (40 mg total) by mouth daily., Disp: 30 tablet, Rfl: 12 .  torsemide (DEMADEX) 20 MG tablet, 2 tablets once or twice a day for swelling (Patient taking differently: Take 40 mg by mouth 2 (two) times daily. ), Disp: 1 tablet, Rfl: 1 .  ULORIC 80 MG TABS, TAKE ONE (1) TABLET EACH DAY, Disp: 30 each, Rfl: 12  Review of Systems  Cardiovascular: Positive for leg swelling. Negative for chest pain and palpitations.  Gastrointestinal: Negative.   Genitourinary: Positive for scrotal swelling and testicular pain. Negative for dysuria, flank pain, frequency, hematuria and penile swelling.  Musculoskeletal: Positive for arthralgias, joint swelling and myalgias.  Skin: Positive for color change.  Neurological: Negative.     Social History  Substance Use Topics  . Smoking status: Former Research scientist (life sciences)  . Smokeless tobacco: Never Used     Comment: Quit in 1990  . Alcohol use No   Objective:   BP 126/78 (BP Location: Right Arm, Patient Position: Sitting, Cuff Size: Large)   Pulse 88   Temp 98.3 F (36.8 C)   Resp 20  Wt 238 lb (108 kg)   BMI 35.15 kg/m  Vitals:   11/24/16 1524  BP: 126/78  Pulse: 88  Resp: 20  Temp: 98.3 F (36.8 C)  Weight: 238 lb (108 kg)     Physical Exam  General appearance: alert, well developed, well nourished, cooperative and in no distress Head: Normocephalic, without obvious abnormality, atraumatic Respiratory: Respirations even and unlabored, normal respiratory rate Extremities: both labs wrapped in unaboot. Moderate swelling of both thighs extending into scrotum. No scratal tenderness or erythema.      Assessment & Plan:     1. Chronic acquired lymphedema Worsening over the last week. He is to double dose of metolazone to 4 x 5mg  daily and continue torsemide 2 x 10mg  daily and return  within a week to recheck edema and electrolytes.   2. Acute on chronic diastolic CHF (congestive heart failure), NYHA class 1 ([place]) He did not bring his medications today and it is unclear if he is still taking metoprolol. He is going to check his medications at home and let me know.   3. Hypertensive pulmonary vascular disease ([place]) Unclear if he is still taking sildenafil. He is to check his medications at home.        Lelon Huh, MD  [facility]

## 2016-11-24 NOTE — Progress Notes (Signed)
History of Present Illness  There is no documented history at this time  Assessments & Plan   There are no diagnoses linked to this encounter.    Additional instructions  Subjective:  Patient presents with venous ulcer of the Bilateral lower extremity.    Procedure:  3 layer unna wrap was placed Bilateral lower extremity.   Plan:   Follow up in one week.  

## 2016-11-26 ENCOUNTER — Telehealth: Payer: Self-pay | Admitting: Emergency Medicine

## 2016-11-26 NOTE — Telephone Encounter (Signed)
He needs to go ahead and restart this because it helps his heart pump more efficiently and may help with swelling, but just take 1/2 tablet a day, a full tablet might make him dizzy. Follow up to check swelling in 7-10 days.

## 2016-11-26 NOTE — Telephone Encounter (Signed)
Pt wanted to call and let you know that he is not taking metoprolol Succinate 100 mg. He was told to call to let you know.

## 2016-11-26 NOTE — Telephone Encounter (Signed)
Patient advised and appointment made-aa

## 2016-12-02 ENCOUNTER — Inpatient Hospital Stay
Admission: EM | Admit: 2016-12-02 | Discharge: 2016-12-07 | DRG: 682 | Disposition: A | Discharge status: Home Health | Payer: Medicare Other | Attending: Internal Medicine | Admitting: Internal Medicine

## 2016-12-02 ENCOUNTER — Emergency Department: Payer: Medicare Other

## 2016-12-02 ENCOUNTER — Encounter
Admission: EM | Disposition: A | Discharge status: Home Health | Payer: Self-pay | Source: Home / Self Care | Attending: Internal Medicine

## 2016-12-02 ENCOUNTER — Other Ambulatory Visit: Payer: Self-pay | Admitting: Family Medicine

## 2016-12-02 ENCOUNTER — Encounter: Payer: Self-pay | Admitting: Emergency Medicine

## 2016-12-02 ENCOUNTER — Encounter (INDEPENDENT_AMBULATORY_CARE_PROVIDER_SITE_OTHER): Payer: Medicare Other

## 2016-12-02 DIAGNOSIS — I878 Other specified disorders of veins: Secondary | ICD-10-CM | POA: Diagnosis present

## 2016-12-02 DIAGNOSIS — E876 Hypokalemia: Secondary | ICD-10-CM | POA: Diagnosis present

## 2016-12-02 DIAGNOSIS — S8002XA Contusion of left knee, initial encounter: Secondary | ICD-10-CM | POA: Diagnosis present

## 2016-12-02 DIAGNOSIS — I679 Cerebrovascular disease, unspecified: Secondary | ICD-10-CM | POA: Diagnosis not present

## 2016-12-02 DIAGNOSIS — R2689 Other abnormalities of gait and mobility: Secondary | ICD-10-CM | POA: Diagnosis not present

## 2016-12-02 DIAGNOSIS — Y92009 Unspecified place in unspecified non-institutional (private) residence as the place of occurrence of the external cause: Secondary | ICD-10-CM

## 2016-12-02 DIAGNOSIS — M79603 Pain in arm, unspecified: Secondary | ICD-10-CM

## 2016-12-02 DIAGNOSIS — F05 Delirium due to known physiological condition: Secondary | ICD-10-CM | POA: Diagnosis not present

## 2016-12-02 DIAGNOSIS — N3000 Acute cystitis without hematuria: Secondary | ICD-10-CM | POA: Diagnosis present

## 2016-12-02 DIAGNOSIS — Z888 Allergy status to other drugs, medicaments and biological substances status: Secondary | ICD-10-CM

## 2016-12-02 DIAGNOSIS — I4891 Unspecified atrial fibrillation: Secondary | ICD-10-CM | POA: Diagnosis present

## 2016-12-02 DIAGNOSIS — W19XXXA Unspecified fall, initial encounter: Secondary | ICD-10-CM | POA: Diagnosis present

## 2016-12-02 DIAGNOSIS — I248 Other forms of acute ischemic heart disease: Secondary | ICD-10-CM | POA: Diagnosis present

## 2016-12-02 DIAGNOSIS — L0390 Cellulitis, unspecified: Secondary | ICD-10-CM | POA: Diagnosis not present

## 2016-12-02 DIAGNOSIS — M7989 Other specified soft tissue disorders: Secondary | ICD-10-CM | POA: Diagnosis not present

## 2016-12-02 DIAGNOSIS — J449 Chronic obstructive pulmonary disease, unspecified: Secondary | ICD-10-CM | POA: Diagnosis present

## 2016-12-02 DIAGNOSIS — N186 End stage renal disease: Secondary | ICD-10-CM | POA: Diagnosis not present

## 2016-12-02 DIAGNOSIS — I872 Venous insufficiency (chronic) (peripheral): Secondary | ICD-10-CM | POA: Diagnosis present

## 2016-12-02 DIAGNOSIS — N19 Unspecified kidney failure: Secondary | ICD-10-CM

## 2016-12-02 DIAGNOSIS — I429 Cardiomyopathy, unspecified: Secondary | ICD-10-CM | POA: Diagnosis present

## 2016-12-02 DIAGNOSIS — R531 Weakness: Secondary | ICD-10-CM | POA: Diagnosis present

## 2016-12-02 DIAGNOSIS — G9341 Metabolic encephalopathy: Secondary | ICD-10-CM

## 2016-12-02 DIAGNOSIS — R601 Generalized edema: Secondary | ICD-10-CM | POA: Diagnosis present

## 2016-12-02 DIAGNOSIS — K921 Melena: Secondary | ICD-10-CM

## 2016-12-02 DIAGNOSIS — G4733 Obstructive sleep apnea (adult) (pediatric): Secondary | ICD-10-CM | POA: Diagnosis present

## 2016-12-02 DIAGNOSIS — I071 Rheumatic tricuspid insufficiency: Secondary | ICD-10-CM | POA: Diagnosis present

## 2016-12-02 DIAGNOSIS — I890 Lymphedema, not elsewhere classified: Secondary | ICD-10-CM

## 2016-12-02 DIAGNOSIS — L97909 Non-pressure chronic ulcer of unspecified part of unspecified lower leg with unspecified severity: Secondary | ICD-10-CM | POA: Diagnosis present

## 2016-12-02 DIAGNOSIS — S0083XA Contusion of other part of head, initial encounter: Secondary | ICD-10-CM | POA: Diagnosis present

## 2016-12-02 DIAGNOSIS — I1311 Hypertensive heart and chronic kidney disease without heart failure, with stage 5 chronic kidney disease, or end stage renal disease: Secondary | ICD-10-CM | POA: Diagnosis present

## 2016-12-02 DIAGNOSIS — N179 Acute kidney failure, unspecified: Secondary | ICD-10-CM | POA: Diagnosis not present

## 2016-12-02 DIAGNOSIS — D500 Iron deficiency anemia secondary to blood loss (chronic): Secondary | ICD-10-CM

## 2016-12-02 DIAGNOSIS — G9349 Other encephalopathy: Secondary | ICD-10-CM | POA: Diagnosis present

## 2016-12-02 DIAGNOSIS — M6282 Rhabdomyolysis: Secondary | ICD-10-CM | POA: Diagnosis not present

## 2016-12-02 DIAGNOSIS — R7989 Other specified abnormal findings of blood chemistry: Secondary | ICD-10-CM | POA: Diagnosis not present

## 2016-12-02 DIAGNOSIS — R296 Repeated falls: Secondary | ICD-10-CM | POA: Diagnosis present

## 2016-12-02 DIAGNOSIS — E8770 Fluid overload, unspecified: Secondary | ICD-10-CM | POA: Diagnosis present

## 2016-12-02 DIAGNOSIS — Z9119 Patient's noncompliance with other medical treatment and regimen: Secondary | ICD-10-CM

## 2016-12-02 DIAGNOSIS — D62 Acute posthemorrhagic anemia: Secondary | ICD-10-CM | POA: Diagnosis present

## 2016-12-02 DIAGNOSIS — Z8249 Family history of ischemic heart disease and other diseases of the circulatory system: Secondary | ICD-10-CM

## 2016-12-02 DIAGNOSIS — R600 Localized edema: Secondary | ICD-10-CM | POA: Diagnosis not present

## 2016-12-02 DIAGNOSIS — M79602 Pain in left arm: Secondary | ICD-10-CM | POA: Diagnosis not present

## 2016-12-02 DIAGNOSIS — S8001XA Contusion of right knee, initial encounter: Secondary | ICD-10-CM | POA: Diagnosis present

## 2016-12-02 DIAGNOSIS — N185 Chronic kidney disease, stage 5: Secondary | ICD-10-CM | POA: Diagnosis present

## 2016-12-02 DIAGNOSIS — E785 Hyperlipidemia, unspecified: Secondary | ICD-10-CM | POA: Diagnosis present

## 2016-12-02 DIAGNOSIS — I272 Pulmonary hypertension, unspecified: Secondary | ICD-10-CM | POA: Diagnosis present

## 2016-12-02 DIAGNOSIS — D638 Anemia in other chronic diseases classified elsewhere: Secondary | ICD-10-CM | POA: Diagnosis present

## 2016-12-02 DIAGNOSIS — R778 Other specified abnormalities of plasma proteins: Secondary | ICD-10-CM

## 2016-12-02 DIAGNOSIS — I517 Cardiomegaly: Secondary | ICD-10-CM | POA: Diagnosis not present

## 2016-12-02 DIAGNOSIS — Z951 Presence of aortocoronary bypass graft: Secondary | ICD-10-CM

## 2016-12-02 DIAGNOSIS — N183 Chronic kidney disease, stage 3 (moderate): Secondary | ICD-10-CM | POA: Diagnosis not present

## 2016-12-02 DIAGNOSIS — Z87891 Personal history of nicotine dependence: Secondary | ICD-10-CM

## 2016-12-02 DIAGNOSIS — I509 Heart failure, unspecified: Secondary | ICD-10-CM | POA: Diagnosis not present

## 2016-12-02 DIAGNOSIS — Z992 Dependence on renal dialysis: Secondary | ICD-10-CM

## 2016-12-02 DIAGNOSIS — Z7901 Long term (current) use of anticoagulants: Secondary | ICD-10-CM

## 2016-12-02 DIAGNOSIS — I5032 Chronic diastolic (congestive) heart failure: Secondary | ICD-10-CM | POA: Diagnosis not present

## 2016-12-02 LAB — CBC WITH DIFFERENTIAL/PLATELET
Band Neutrophils: 0 %
Basophils Absolute: 0 10*3/uL (ref 0–0.1)
Basophils Relative: 0 %
Blasts: 0 %
Eosinophils Absolute: 0.1 10*3/uL (ref 0–0.7)
Eosinophils Relative: 1 %
HCT: 25.5 % — ABNORMAL LOW (ref 40.0–52.0)
Hemoglobin: 8.4 g/dL — ABNORMAL LOW (ref 13.0–18.0)
Lymphocytes Relative: 8 %
Lymphs Abs: 1 10*3/uL (ref 1.0–3.6)
MCH: 30.3 pg (ref 26.0–34.0)
MCHC: 33.1 g/dL (ref 32.0–36.0)
MCV: 91.7 fL (ref 80.0–100.0)
Metamyelocytes Relative: 0 %
Monocytes Absolute: 0.6 10*3/uL (ref 0.2–1.0)
Monocytes Relative: 5 %
Myelocytes: 0 %
Neutro Abs: 11.2 10*3/uL — ABNORMAL HIGH (ref 1.4–6.5)
Neutrophils Relative %: 86 %
Other: 0 %
Platelets: 327 10*3/uL (ref 150–440)
Promyelocytes Absolute: 0 %
RBC: 2.78 MIL/uL — ABNORMAL LOW (ref 4.40–5.90)
RDW: 18 % — ABNORMAL HIGH (ref 11.5–14.5)
WBC: 12.9 10*3/uL — ABNORMAL HIGH (ref 3.8–10.6)
nRBC: 0 /100 WBC

## 2016-12-02 LAB — URINE DRUG SCREEN, QUALITATIVE (ARMC ONLY)
Amphetamines, Ur Screen: NOT DETECTED
Barbiturates, Ur Screen: NOT DETECTED
Benzodiazepine, Ur Scrn: NOT DETECTED
Cannabinoid 50 Ng, Ur ~~LOC~~: NOT DETECTED
Cocaine Metabolite,Ur ~~LOC~~: NOT DETECTED
MDMA (Ecstasy)Ur Screen: NOT DETECTED
Methadone Scn, Ur: NOT DETECTED
Opiate, Ur Screen: POSITIVE — AB
Phencyclidine (PCP) Ur S: NOT DETECTED
Tricyclic, Ur Screen: NOT DETECTED

## 2016-12-02 LAB — COMPREHENSIVE METABOLIC PANEL WITH GFR
ALT: 121 U/L — ABNORMAL HIGH (ref 17–63)
AST: 177 U/L — ABNORMAL HIGH (ref 15–41)
Albumin: 3.8 g/dL (ref 3.5–5.0)
Alkaline Phosphatase: 83 U/L (ref 38–126)
Anion gap: 17 — ABNORMAL HIGH (ref 5–15)
BUN: 121 mg/dL — ABNORMAL HIGH (ref 6–20)
CO2: 24 mmol/L (ref 22–32)
Calcium: 7.8 mg/dL — ABNORMAL LOW (ref 8.9–10.3)
Chloride: 94 mmol/L — ABNORMAL LOW (ref 101–111)
Creatinine, Ser: 3.98 mg/dL — ABNORMAL HIGH (ref 0.61–1.24)
GFR calc Af Amer: 15 mL/min — ABNORMAL LOW
GFR calc non Af Amer: 13 mL/min — ABNORMAL LOW
Glucose, Bld: 131 mg/dL — ABNORMAL HIGH (ref 65–99)
Potassium: 3.5 mmol/L (ref 3.5–5.1)
Sodium: 135 mmol/L (ref 135–145)
Total Bilirubin: 1 mg/dL (ref 0.3–1.2)
Total Protein: 7.2 g/dL (ref 6.5–8.1)

## 2016-12-02 LAB — AMMONIA: Ammonia: 19 umol/L (ref 9–35)

## 2016-12-02 LAB — URINALYSIS, COMPLETE (UACMP) WITH MICROSCOPIC
Bilirubin Urine: NEGATIVE
Glucose, UA: NEGATIVE mg/dL
Ketones, ur: NEGATIVE mg/dL
Leukocytes, UA: NEGATIVE
Nitrite: NEGATIVE
Protein, ur: 30 mg/dL — AB
Specific Gravity, Urine: 1.012 (ref 1.005–1.030)
pH: 6 (ref 5.0–8.0)

## 2016-12-02 LAB — DIGOXIN LEVEL: Digoxin Level: 1.7 ng/mL (ref 0.8–2.0)

## 2016-12-02 LAB — CK: Total CK: 2276 U/L — ABNORMAL HIGH (ref 49–397)

## 2016-12-02 LAB — BRAIN NATRIURETIC PEPTIDE: B Natriuretic Peptide: 307 pg/mL — ABNORMAL HIGH (ref 0.0–100.0)

## 2016-12-02 LAB — TROPONIN I: Troponin I: 0.54 ng/mL

## 2016-12-02 SURGERY — CENTRAL LINE INSERTION
Anesthesia: LOCAL

## 2016-12-02 MED ORDER — FEBUXOSTAT 40 MG PO TABS
40.0000 mg | ORAL_TABLET | Freq: Every morning | ORAL | Status: DC
  Administered 2016-12-03 – 2016-12-07 (×5): 40 mg via ORAL
  Filled 2016-12-02 (×5): qty 1

## 2016-12-02 MED ORDER — SODIUM CHLORIDE 0.9 % IV SOLN
3.0000 g | Freq: Two times a day (BID) | INTRAVENOUS | Status: DC
  Administered 2016-12-02 – 2016-12-03 (×2): 3 g via INTRAVENOUS
  Filled 2016-12-02 (×5): qty 3

## 2016-12-02 MED ORDER — ACETAMINOPHEN 325 MG PO TABS: 650.0000 mg | ORAL_TABLET | Freq: Four times a day (QID) | ORAL | Status: DC | PRN

## 2016-12-02 MED ORDER — LIDOCAINE-EPINEPHRINE (PF) 2 %-1:200000 IJ SOLN
INTRAMUSCULAR | Status: AC
  Filled 2016-12-02: qty 20

## 2016-12-02 MED ORDER — PANTOPRAZOLE SODIUM 40 MG IV SOLR
40.0000 mg | Freq: Two times a day (BID) | INTRAVENOUS | Status: DC
  Administered 2016-12-02 – 2016-12-07 (×11): 40 mg via INTRAVENOUS
  Filled 2016-12-02 (×11): qty 40

## 2016-12-02 MED ORDER — POLYETHYLENE GLYCOL 3350 17 GM/SCOOP PO POWD
1.0000 | Freq: Once | ORAL | Status: DC
  Filled 2016-12-02: qty 255

## 2016-12-02 MED ORDER — OXYCODONE HCL 5 MG PO TABS
5.0000 mg | ORAL_TABLET | Freq: Four times a day (QID) | ORAL | Status: DC | PRN
  Administered 2016-12-02 – 2016-12-03 (×3): 5 mg via ORAL
  Filled 2016-12-02 (×3): qty 1

## 2016-12-02 MED ORDER — SODIUM CHLORIDE 0.9 % IV SOLN
INTRAVENOUS | Status: DC
  Administered 2016-12-02: 11:00:00 via INTRAVENOUS

## 2016-12-02 MED ORDER — ACETAMINOPHEN 650 MG RE SUPP: 650.0000 mg | Freq: Four times a day (QID) | RECTAL | Status: DC | PRN

## 2016-12-02 MED ORDER — DICLOFENAC SODIUM 1 % TD GEL
2.0000 g | Freq: Four times a day (QID) | TRANSDERMAL | Status: DC
  Administered 2016-12-02 – 2016-12-07 (×20): 2 g via TOPICAL
  Filled 2016-12-02: qty 100

## 2016-12-02 MED ORDER — PRAMIPEXOLE DIHYDROCHLORIDE 0.25 MG PO TABS
0.1250 mg | ORAL_TABLET | Freq: Every evening | ORAL | Status: DC | PRN
  Administered 2016-12-02: 0.125 mg via ORAL
  Filled 2016-12-02 (×3): qty 1

## 2016-12-02 NOTE — ED Triage Notes (Signed)
Pt to ED by EMS with c/o of increased confusion. Wife told EMS that the pt has become "more confused over the last week". Per EMS pt has complaints of dysuria and swelling of testicles which the pt denies at this time. Per EMS the bilateral swelling of his lower extremities is "normal".

## 2016-12-02 NOTE — Progress Notes (Signed)
POST DIALYSIS ASSESSMENT 

## 2016-12-02 NOTE — Consult Note (Signed)
Pharmacy Antibiotic Note  Vincent Mason is a 81 y.o. male admitted on 12/02/2016 with cellulitis.  Pharmacy has been consulted for amp/sulbactam dosing.  Plan: amp/sulbactam 3g q 12 hr due to renal function. Continue to monitor renal function for any needed dose adjustments  Height: 5\' 9"  (175.3 cm) Weight: 238 lb (108 kg) IBW/kg (Calculated) : 70.7  Temp (24hrs), Avg:97.4 F (36.3 C), Min:97.4 F (36.3 C), Max:97.4 F (36.3 C)   Recent Labs Lab 12/02/16 0737  WBC 12.9*  CREATININE 3.98*    Estimated Creatinine Clearance: 18.2 mL/min (A) (by C-G formula based on SCr of 3.98 mg/dL (H)).    Allergies  Allergen Reactions  . Decongestant  [Oxymetazoline]     Antimicrobials this admission: unasyn 7/11 >>   Dose adjustments this admission:   Microbiology results:   Thank you for allowing pharmacy to be a part of this patient's care.  Ramond Dial, Pharm.D, BCPS Clinical Pharmacist  12/02/2016 9:54 AM

## 2016-12-02 NOTE — Progress Notes (Signed)
[facility], [place] 12/02/16  Subjective:   Patient known to our practice from December 2015. He presents via EMS for increasing confusion.  He also has massive edema of his legs, abdomen and testicles.  He denies acute shortness of breath but is requiring oxygen by nasal cannula  Objective:  Vital signs in last 24 hours:  Temp:  [97.4 F (36.3 C)] 97.4 F (36.3 C) (07/11 1047) Pulse Rate:  [49-71] 61 (07/11 1047) Resp:  [16-25] 18 (07/11 1047) BP: (106-140)/(56-73) 106/56 (07/11 1047) SpO2:  [95 %-100 %] 99 % (07/11 1047) Weight:  [94.6 kg (208 lb 8 oz)-108 kg (238 lb)] 94.6 kg (208 lb 8 oz) (07/11 1047)  Weight change:  Filed Weights   12/02/16 0731 12/02/16 1047  Weight: 108 kg (238 lb) 94.6 kg (208 lb 8 oz)    Intake/Output:    Intake/Output Summary (Last 24 hours) at 12/02/16 1629 Last data filed at 12/02/16 1350  Gross per 24 hour  Intake              240 ml  Output              250 ml  Net              -10 ml     Physical Exam: General: No acute distress, laying in the bed  HEENT Anicteric, moist oral mucous membranes  Neck supple  Pulm/lungs Decreased breath sounds at bases  CVS/Heart Irregular, soft systolic murmur  Abdomen:  Soft, mildly distended  Extremities: Massive edema bilaterally up to the thighs and lower abdomen  Neurologic: Alert, able to follow commands  Skin: No acute rashes  Access: To be placed       Basic Metabolic Panel:   Recent Labs Lab 12/02/16 0737  NA 135  K 3.5  CL 94*  CO2 24  GLUCOSE 131*  BUN 121*  CREATININE 3.98*  CALCIUM 7.8*     CBC:  Recent Labs Lab 12/02/16 0737  WBC 12.9*  NEUTROABS 11.2*  HGB 8.4*  HCT 25.5*  MCV 91.7  PLT 327     Lab Results  Component Value Date   HEPBSAG Negative 10/10/2015   HEPBSAB Reactive 09/11/2015   HEPBIGM Negative 10/10/2015      Microbiology:  Recent Results (from the past 240 hour(s))  Aerobic/Anaerobic Culture  (surgical/deep wound)     Status: None (Preliminary result)   Collection Time: 12/02/16 12:53 PM  Result Value Ref Range Status   Specimen Description   Final    LEG LEFT Performed at [facility], [place] [address], [place], [place] [address]    Special Requests Normal  Final   Gram Stain PENDING  Incomplete   Culture PENDING  Incomplete   Report Status PENDING  Incomplete    Coagulation Studies:  Recent Labs  12/02/16 0851  LABPROT 46.2*  INR 4.79*    Urinalysis:  Recent Labs  12/02/16 0737  COLORURINE YELLOW*  LABSPEC 1.012  PHURINE 6.0  GLUCOSEU NEGATIVE  HGBUR MODERATE*  BILIRUBINUR NEGATIVE  KETONESUR NEGATIVE  PROTEINUR 30*  NITRITE NEGATIVE  LEUKOCYTESUR NEGATIVE      Imaging: Dg Chest 2 View  Result Date: 12/02/2016 CLINICAL DATA:  Increased confusion.  Worsening over the past week. EXAM: CHEST  2 VIEW COMPARISON:  09/16/2015. FINDINGS: Cardiac enlargement. Prior CABG. Vascular congestion without overt failure or consolidation. No osseous findings. IMPRESSION: Improved aeration. Cardiomegaly with vascular congestion but no frank edema. Electronically Signed  By: Staci Righter M.D.   On: 12/02/2016 08:42   Dg Forearm Left  Result Date: 12/02/2016 CLINICAL DATA:  Left forearm swelling after fall. EXAM: LEFT FOREARM - 2 VIEW COMPARISON:  None. FINDINGS: There is no evidence of fracture or other focal bone lesions. Soft tissues are unremarkable. IMPRESSION: Normal left forearm. Electronically Signed   By: Marijo Conception, M.D.   On: 12/02/2016 08:42   Ct Head Wo Contrast  Result Date: 12/02/2016 CLINICAL DATA:  Increased confusion.  Worsening over the past week. EXAM: CT HEAD WITHOUT CONTRAST TECHNIQUE: Contiguous axial images were obtained from the base of the skull through the vertex without intravenous contrast. COMPARISON:  09/02/2016 MR. 09/03/2015 CT. FINDINGS: Brain: No evidence of acute infarction, hemorrhage, hydrocephalus, extra-axial collection  or mass lesion/mass effect. Advanced atrophy. Chronic microvascular ischemic change. Vascular: Calcification of the cavernous internal carotid arteries consistent with cerebrovascular atherosclerotic disease. No signs of intracranial large vessel occlusion. Skull: Normal. Negative for fracture or focal lesion. Sinuses/Orbits: No acute finding. Other: None. IMPRESSION: Atrophy and small vessel disease.  No acute intracranial findings. Similar appearance to prior MR, when technique differences are considered. Electronically Signed   By: Staci Righter M.D.   On: 12/02/2016 08:37     Medications:   . sodium chloride 50 mL/hr at 12/02/16 1120  . ampicillin-sulbactam (UNASYN) IV Stopped (12/02/16 1230)   . diclofenac sodium  2 g Topical QID  . [START ON 12/03/2016] febuxostat  40 mg Oral q morning - 10a  . pantoprazole (PROTONIX) IV  40 mg Intravenous Q12H   acetaminophen **OR** acetaminophen, oxyCODONE, pramipexole  Assessment/ Plan:  81 y.o.caucasian male With atrial fibrillation, pulmonary hypertension, severe tricuspid regurgitation, obstructive sleep apnea, hypertension, COPD, chronic kidney disease  1.  Acute renal failure, likely cardiorenal syndrome 2.  Chronic kidney disease stage V, baseline creatinine 1.85/GFR 34 in April 2017 3.  Severe anasarca 4.  Severe tricuspid regurgitation, dilated right atrium 5.  Pulmonary hypertension 6.  Coagulopathy with INR 4.8  Patient appears to have developed anasarca due to cardiorenal syndrome. He has experienced diuretic failure.  Due to pulmonary hypertension, low normal blood pressure, intravenous diuretics might not be effective.    Plan: Discussed with patient and his wife that he needs to follow strict low salt diet.  Patient eats out quite a bit.  Avoid canned foods also.  Dietitian evaluation for low salt diet. Due to developing encephalopathy likely from uremia, likelihood of diuretic failure, discussed with patient that he would benefit  from dialysis for ultrafiltration of fluid as well as clearing uremia.  At this point, we are not sure how much renal recovery he'll have. further recommendations as course progresses. He'll probably need daily dialysis for a few days to remove fluid   LOS: 0 [facility] 7/11/20184:29 PM  [place], [place]

## 2016-12-02 NOTE — H&P (Signed)
[place] [facility] [place] NAME: Kelii Chittum    MR#:  [id number]  DATE OF BIRTH:  [birth year]  DATE OF ADMISSION:  12/02/2016  PRIMARY CARE PHYSICIAN: Birdie Sons, MD   REQUESTING/REFERRING PHYSICIAN: Dr Merlyn Lot  CHIEF COMPLAINT:   Chief Complaint  Patient presents with  . Altered Mental Status    HISTORY OF PRESENT ILLNESS:  Vincent Mason  is a 81 y.o. male presents with passing out episodes and falling down. He states he's been getting lots of bruises. He's had some swelling of his legs and fluid weight gain and they've been trying to get rid of fluid with medications. He does have occasional shortness of breath. The patient does have Unna boots on his legs for swelling. Patient's also having altered mental status and is not the best historian and his wife already went home. In the ER he was found to have an elevated creatinine of 3.98 and an elevated BUN of 121. His hemoglobin dropped from 11.2 down to 8.4 in a few months. Hospitalist services were contacted for further evaluation.  PAST MEDICAL HISTORY:   Past Medical History:  Diagnosis Date  . Acute encephalopathy 09/03/2015  . Atrial fibrillation and flutter [facility]) 2013   Dr Nehemiah Massed at [facility]  on [address].   . C. difficile colitis 09/12/2015  . COPD (chronic obstructive pulmonary disease) ([place])   . HCAP (healthcare-associated pneumonia) 09/12/2015  . Hyperlipidemia   . Hypertension   . Kidney disease, chronic, stage III (moderate, EGFR 30-59 ml/min)    ARF 2013  . OSA (obstructive sleep apnea)    non-compliant with CPAP.   Marland Kitchen Severe sepsis with septic shock ([place]) 09/12/2015    PAST SURGICAL HISTORY:   Past Surgical History:  Procedure Laterality Date  . CATARACT EXTRACTION    . CORONARY ARTERY BYPASS GRAFT  10/1997  . history of cervical discectomy  2009   C5-C6  . Hyperplastic colon polyp  02/2002   Sigmoid polyps    SOCIAL HISTORY:   Social History   Substance Use Topics  . Smoking status: Former Research scientist (life sciences)  . Smokeless tobacco: Never Used     Comment: Quit in 1990  . Alcohol use No    FAMILY HISTORY:   Family History  Problem Relation Age of Onset  . Hypertension Mother     DRUG ALLERGIES:   Allergies  Allergen Reactions  . Decongestant  [Oxymetazoline]     REVIEW OF SYSTEMS:  CONSTITUTIONAL: No fever. Positive for fatigue and weakness.  EYES: No blurred or double vision.  EARS, NOSE, AND THROAT: No tinnitus or ear pain. No sore throat. Right hearing loss RESPIRATORY: No cough. Some shortness of breath, no wheezing or hemoptysis.  CARDIOVASCULAR: No chest pain, orthopnea, edema.  GASTROINTESTINAL: No nausea, vomiting, diarrhea or abdominal pain. No blood in bowel movements GENITOURINARY: No dysuria, hematuria.  ENDOCRINE: No polyuria, nocturia,  HEMATOLOGY: No anemia, easy bruising or bleeding SKIN: Bruising, left leg blistering and edema MUSCULOSKELETAL: No joint pain or arthritis.   NEUROLOGIC: No tingling, numbness, weakness.  PSYCHIATRY: No anxiety or depression.   MEDICATIONS AT HOME:   Prior to Admission medications   Medication Sig Start Date End Date Taking? Authorizing Provider  methocarbamol (ROBAXIN) 750 MG tablet Take 750 mg by mouth at bedtime as needed for muscle spasms.   Yes [provider]  sildenafil (REVATIO) 20 MG tablet Take 20 mg by mouth 3 (three) times daily as needed.   Yes [provider]  [place] 125 MCG tablet  12/06/15   [provider]  metolazone (ZAROXOLYN) 5 MG tablet Take 4 tablets (20 mg total) by mouth daily. 11/24/16   Birdie Sons, MD  oxyCODONE (ROXICODONE) 15 MG immediate release tablet Take 1 tablet (15 mg total) by mouth every 6 (six) hours as needed for pain. 10/26/16   Birdie Sons, MD  potassium chloride SA (K-DUR,KLOR-CON) 20 MEQ tablet Take 1 tablet (20 mEq total) by mouth 2 (two) times daily. 06/18/16   Birdie Sons, MD  pramipexole  (MIRAPEX) 0.25 MG tablet TAKE ONE TO TWO TABLETS BY MOUTH AT BEDTIME AS NEEDED FOR RESTLESS LEG 07/21/16   Birdie Sons, MD  ranitidine (ZANTAC) 150 MG tablet Take 1 tablet by mouth 2 (two) times daily as needed. Reported on 12/12/2015 08/11/13   [provider]  rivaroxaban (XARELTO) 20 MG TABS tablet Take 1 tablet (20 mg total) by mouth daily with supper. 04/24/16   Birdie Sons, MD  rosuvastatin (CRESTOR) 40 MG tablet Take 1 tablet (40 mg total) by mouth daily. 05/15/16   Birdie Sons, MD  torsemide (DEMADEX) 20 MG tablet 2 tablets once or twice a day for swelling Patient taking differently: Take 40 mg by mouth 2 (two) times daily.  08/26/15   Birdie Sons, MD  ULORIC 80 MG TABS TAKE ONE (1) TABLET EACH DAY 02/19/16   Birdie Sons, MD      VITAL SIGNS:  Blood pressure 140/71, pulse 67, temperature (!) 97.4 F (36.3 C), temperature source Oral, height _0  (1.753 m), weight 108 kg (238 lb), SpO2 100 %.  PHYSICAL EXAMINATION:  GENERAL:  81 y.o.-year-old patient lying in the bed with no acute distress.  EYES: Pupils equal, round, reactive to light and accommodation. No scleral icterus. Extraocular muscles intact.  HEENT: Head atraumatic, normocephalic. Oropharynx and nasopharynx clear.  NECK:  Supple, no jugular venous distention. No thyroid enlargement, no tenderness.  LUNGS: Normal breath sounds bilaterally, no wheezing, rales,rhonchi or crepitation. No use of accessory muscles of respiration.  CARDIOVASCULAR: S1, S2 normal. No murmurs, rubs, or gallops.  ABDOMEN: Soft, nontender, nondistended. Bowel sounds present. No organomegaly or mass.  EXTREMITIES: edema 4+ bilateral lower extremities . No cyanosis, or clubbing.  NEUROLOGIC: Cranial nerves II through XII are intact. Muscle strength 5/5 in all extremities. Sensation intact. Gait not checked.  PSYCHIATRIC: The patient is alert and answers questions but not the best historian.  SKIN:  right lower extremity no  signs of infection on slight small ulcerations. Left lower extremity to areas where a greenish discoloration is coming from and those are larger ulcerations. Bruising seen all over his body left arm, neck, right lower back and back.   LABORATORY PANEL:   CBC  Recent Labs Lab 12/02/16 0737  WBC 12.9*  HGB 8.4*  HCT 25.5*  PLT 327   ------------------------------------------------------------------------------------------------------------------  Chemistries   Recent Labs Lab 12/02/16 0737  NA 135  K 3.5  CL 94*  CO2 24  GLUCOSE 131*  BUN 121*  CREATININE 3.98*  CALCIUM 7.8*  AST 177*  ALT 121*  ALKPHOS 83  BILITOT 1.0   ------------------------------------------------------------------------------------------------------------------  Cardiac Enzymes  Recent Labs Lab 12/02/16 0737  TROPONINI 0.54*   ------------------------------------------------------------------------------------------------------------------  RADIOLOGY:  Dg Chest 2 View  Result Date: 12/02/2016 CLINICAL DATA:  Increased confusion.  Worsening over the past week. EXAM: CHEST  2 VIEW COMPARISON:  09/16/2015. FINDINGS: Cardiac enlargement. Prior CABG. Vascular congestion without overt  failure or consolidation. No osseous findings. IMPRESSION: Improved aeration. Cardiomegaly with vascular congestion but no frank edema. Electronically Signed   By: Staci Righter M.D.   On: 12/02/2016 08:42   Dg Forearm Left  Result Date: 12/02/2016 CLINICAL DATA:  Left forearm swelling after fall. EXAM: LEFT FOREARM - 2 VIEW COMPARISON:  None. FINDINGS: There is no evidence of fracture or other focal bone lesions. Soft tissues are unremarkable. IMPRESSION: Normal left forearm. Electronically Signed   By: Marijo Conception, M.D.   On: 12/02/2016 08:42   Ct Head Wo Contrast  Result Date: 12/02/2016 CLINICAL DATA:  Increased confusion.  Worsening over the past week. EXAM: CT HEAD WITHOUT CONTRAST TECHNIQUE: Contiguous  axial images were obtained from the base of the skull through the vertex without intravenous contrast. COMPARISON:  09/02/2016 MR. 09/03/2015 CT. FINDINGS: Brain: No evidence of acute infarction, hemorrhage, hydrocephalus, extra-axial collection or mass lesion/mass effect. Advanced atrophy. Chronic microvascular ischemic change. Vascular: Calcification of the cavernous internal carotid arteries consistent with cerebrovascular atherosclerotic disease. No signs of intracranial large vessel occlusion. Skull: Normal. Negative for fracture or focal lesion. Sinuses/Orbits: No acute finding. Other: None. IMPRESSION: Atrophy and small vessel disease.  No acute intracranial findings. Similar appearance to prior MR, when technique differences are considered. Electronically Signed   By: Staci Righter M.D.   On: 12/02/2016 08:37    EKG:   Atrial fibrillation 58 bpm   IMPRESSION AND PLAN:   1. Acute kidney injury on chronic kidney disease stage III. Hold Demadex and Zaroxolyn. Gentle IV fluid hydration. Hold all nephrotoxic medications. Nephrology consultation. Serial creatinines  2. Drop in hemoglobin with elevated BUN. Patient is having brown stool which was guaiac positive. Unclear if GI bleed or not. Serial hemoglobins. GI consultation. Empiric Protonix. Stop Xaralto. 3. History of atrial fibrillation stroke risk will be higher off Xaralto. Hold digoxin for right now also with acute kidney injury. 4. Anasarca, lower extremity ulcerations. Empiric Unasyn. Wound care nurse to evaluate for replacing Unna boots. 5. Elevated troponin not quite sure what to make of this likely demand ischemia or false positive for chronic kidney disease. no complaints of chest pain. 6. History of COPD respiratory status is stable 7. Obstructive sleep apnea 8. Hyperlipidemia unspecified on Crestor. Check a CPK prior to restarting Crestor.    All the records are reviewed and case discussed with ED provider. Management plans  discussed with the patient, and he is  in agreement.  CODE STATUS: full code   TOTAL TIME TAKING CARE OF THIS PATIENT: 55  minutes.    Loletha Grayer M.D on 12/02/2016 at 9:40 AM  Between 7am to 6pm - Pager - [phone]  After 6pm call admission pager [phone]  [facility] Office  [phone]  CC: Primary care physician; Birdie Sons, MD

## 2016-12-02 NOTE — Progress Notes (Signed)
PRE DIALYSIS ASSESSMENT 

## 2016-12-02 NOTE — Evaluation (Signed)
Physical Therapy Evaluation Patient Details Name: Vincent Mason MRN: [id number] DOB: April 06, 1936 Today's Date: 12/02/2016   History of Present Illness  81 y/o male admitted with confusion s/p passing out and falling. Pt found to have blood in stool, anemia, arm pain, elevated troponin (.54 - likely due to demand ischemia/kidney injury), and an acute kidney injury. BUN (121) and creatinine (3.98) were elevated upon admission. PMH includes a-fib with flutter, c-diff, COPD, HCAP, HLD, HTN, CKD III, OSA, R hearing loss, and sepsis with shock.    Clinical Impression  Pt is a 81 year old admitted for an acute kidney injury. Upon arrival to room, pt was impulsively attempting to get out of bed to urinate. PT assisted pt to EOB and pt's wife assisted pt with clothing manipulation. Pt attempted to stand up despite PT's commands to stay seated EOB. Lead PT called into room to provide assistance/safety to situation. During today's evaluation, RN entered room to check on recent dialysis catheter placed this date. PT became aware this was a temporary hemodialysis catheter in middle of evaluation. PT immediately ceased this session and transferred pt back to supine position. Site assessed by main PT. RN notified. Per hospital policy, pt to remain on strict bed rest until temporary catheter is removed. Pt performed bed mobility and transfers with +2 mod assist for safety. Ambulation deferred due to pt's impulsivity, weakness, and temporary femoral cath. Pt demonstrates deficits in pain, skin integrity, and functional mobility. Would benefit from skilled PT to address above deficits and promote optimal return to PLOF. PT recommends dc to SNF.     Follow Up Recommendations SNF    Equipment Recommendations  Other (comment) (None at this time - to follow up next visit)    Recommendations for Other Services       Precautions / Restrictions Precautions Precautions: Fall Restrictions Weight Bearing Restrictions: No       Mobility  Bed Mobility Overal bed mobility: Needs Assistance Bed Mobility: Supine to Sit;Sit to Supine     Supine to sit: Mod assist;+2 for safety/equipment Sit to supine: Mod assist;+2 for safety/equipment   General bed mobility comments: Sit to/from supine with +2 mod assist due to pt's impulsivity and weakness. Pt sat EOB for 10-49min attempting to urinate into urinal. Pt steady with feet supported and without UE assist.  Transfers Overall transfer level: Needs assistance   Transfers: Sit to/from Stand Sit to Stand: Mod assist;+2 safety/equipment;+2 physical assistance         General transfer comment: Sit to/from stand performed 2x with +2 mod assist for safety. Pt impulsive to come to standing position, and was mildly unsteady on feet.  Ambulation/Gait             General Gait Details: Deferred due to pt's weakness and impulsivity  Stairs            Wheelchair Mobility    Modified Rankin (Stroke Patients Only)       Balance Overall balance assessment: History of Falls (See above for balance - good sitting, fair standing)                                           Pertinent Vitals/Pain Pain Assessment: Faces Faces Pain Scale: Hurts little more Pain Location: B knees Pain Descriptors / Indicators: Grimacing;Guarding;Discomfort Pain Intervention(s): Limited activity within patient's tolerance;Monitored during session    Home Living  Family/patient expects to be discharged to:: Private residence Living Arrangements: Spouse/significant other Available Help at Discharge: Family             Additional Comments: Home environment questions not completed this session. Will follow up at next treatment.     Prior Function                 Hand Dominance        Extremity/Trunk Assessment   Upper Extremity Assessment Upper Extremity Assessment: Overall WFL for tasks assessed (grossly 4/5 BUE's - no formal MMT  performed)    Lower Extremity Assessment Lower Extremity Assessment: Generalized weakness (grossly 4/5 for BLE's - no formal MMT performed)       Communication   Communication: HOH  Cognition Arousal/Alertness: Awake/alert Behavior During Therapy: Impulsive Overall Cognitive Status: Impaired/Different from baseline Area of Impairment: Following commands;Safety/judgement;Awareness                               General Comments: Pt very impulsive this session with decreased safety awareness. Pt continued to ask PT to step away from him when sitting EOB/standing. Pt able to follow commands fairly consistently with reinforcement and when simple cues given.      General Comments      Exercises Other Exercises Other Exercises: Stood for several minutes attempting to urinate into urinal. Pt able to stand with +2 min-mod assist for safety. Mild unsteadiness noted.    Assessment/Plan    PT Assessment Patient needs continued PT services  PT Problem List Decreased strength;Decreased range of motion;Decreased activity tolerance;Decreased balance;Decreased mobility;Decreased safety awareness;Pain;Decreased skin integrity (BLE wounds/ulcers)       PT Treatment Interventions Gait training;Stair training;Functional mobility training;Therapeutic activities;Therapeutic exercise;Balance training;Patient/family education    PT Goals (Current goals can be found in the Care Plan section)  Acute Rehab PT Goals Patient Stated Goal: to get stronger PT Goal Formulation: With patient Time For Goal Achievement: 12/16/16 Potential to Achieve Goals: Good    Frequency Min 2X/week   Barriers to discharge        Co-evaluation               AM-PAC PT "6 Clicks" Daily Activity  Outcome Measure Difficulty turning over in bed (including adjusting bedclothes, sheets and blankets)?: A Little Difficulty moving from lying on back to sitting on the side of the bed? : Total Difficulty  sitting down on and standing up from a chair with arms (e.g., wheelchair, bedside commode, etc,.)?: Total Help needed moving to and from a bed to chair (including a wheelchair)?: A Lot Help needed walking in hospital room?: A Lot Help needed climbing 3-5 steps with a railing? : Total 6 Click Score: 10    End of Session Equipment Utilized During Treatment: Gait belt Activity Tolerance: Other (comment) (limited by weakness, impulsivity) Patient left: in bed;with call bell/phone within reach;with bed alarm set;with family/visitor present Nurse Communication: Mobility status;Other (comment) (orthostatics not performed) PT Visit Diagnosis: Other abnormalities of gait and mobility (R26.89);Muscle weakness (generalized) (M62.81);History of falling (Z91.81);Pain Pain - Right/Left:  (B knees) Pain - part of body: Knee    Time: 4403-4742 PT Time Calculation (min) (ACUTE ONLY): 35 min   Charges:         PT G Codes:        Donaciano Eva, PT, SPT  Marni Griffon 12/02/2016, 4:33 PM

## 2016-12-02 NOTE — Progress Notes (Signed)
CH made a follow up pt at 2:00pm to educated him about AD, but the physician was in the Rm at the time. [name] waiting for a while and decided to visit another pt and then return later. Vincent Mason returned at 4:00pm and was able to educate pt on advanced directive. Pt informed CH, he was going to fill out the AD and have Vincent Mason help him to complete it tomorrow morning. Vincent Mason agee and will visit pt tomorrow morning to help pt complete AD.   12/02/16 1600  Clinical Encounter Type  Visited With Patient and family together  Visit Type Follow-up;Other (Comment)  Referral From Chaplain  Consult/Referral To Chaplain  Spiritual Encounters  Spiritual Needs Literature

## 2016-12-02 NOTE — Progress Notes (Signed)
HD STARTED  

## 2016-12-02 NOTE — Consult Note (Signed)
Vincent Lame, MD [facility]  [address], [place], [place] [address] Phone: [phone] Fax : [phone]  Consultation  Referring Provider:     Dr. Leslye Peer Primary Care Physician:  Birdie Sons, MD Primary Gastroenterologist:  Dr. Vira Agar         Reason for Consultation:     Heme-positive stools with anemia  Date of Admission:  12/02/2016 Date of Consultation:  12/02/2016         HPI:   Vincent Mason is a 81 y.o. male who was admitted with altered mental status. The patient was reported to pass out and was falling down. The patient also has extensive bruising on his body. The patient has reported that he is been playing with his medication to experiment which he shouldn't could decrease the swelling in his legs. On admission the patient was found to have altered mental status with a drop in hemoglobin from his baseline of 11.2 down to 8.4. The patient was also found to have an elevated creatinine at 3.98 with a BUN of 121. The patient is not able to give much history and is only orientated to person and place. He thinks it is the year 2020 and that it is June. There is no report of any unexplained weight loss fevers chills nausea or vomiting. The patient is also on blood thinners for atrial fibrillation. The patient also states that he has brown stools without any black stools or bloody stools.  Past Medical History:  Diagnosis Date  . Acute encephalopathy 09/03/2015  . Atrial fibrillation and flutter [facility]) 2013   Dr Nehemiah Massed at [facility]  on [place].   . C. difficile colitis 09/12/2015  . COPD (chronic obstructive pulmonary disease) ([place])   . HCAP (healthcare-associated pneumonia) 09/12/2015  . Hyperlipidemia   . Hypertension   . Kidney disease, chronic, stage III (moderate, EGFR 30-59 ml/min)    ARF 2013  . OSA (obstructive sleep apnea)    non-compliant with CPAP.   Marland Kitchen Severe sepsis with septic shock ([place]) 09/12/2015    Past Surgical History:  Procedure Laterality Date  .  CATARACT EXTRACTION    . CORONARY ARTERY BYPASS GRAFT  10/1997  . history of cervical discectomy  2009   C5-C6  . Hyperplastic colon polyp  02/2002   Sigmoid polyps    Prior to Admission medications   Medication Sig Start Date End Date Taking? Authorizing Provider  methocarbamol (ROBAXIN) 750 MG tablet Take 750 mg by mouth at bedtime as needed for muscle spasms.   Yes [provider]  sildenafil (REVATIO) 20 MG tablet Take 20 mg by mouth 3 (three) times daily as needed.   Yes [provider]  [place] 125 MCG tablet  12/06/15   [provider]  metolazone (ZAROXOLYN) 5 MG tablet Take 4 tablets (20 mg total) by mouth daily. 11/24/16   Birdie Sons, MD  oxyCODONE (ROXICODONE) 15 MG immediate release tablet Take 1 tablet (15 mg total) by mouth every 6 (six) hours as needed for pain. 10/26/16   Birdie Sons, MD  potassium chloride SA (K-DUR,KLOR-CON) 20 MEQ tablet Take 1 tablet (20 mEq total) by mouth 2 (two) times daily. 06/18/16   Birdie Sons, MD  pramipexole (MIRAPEX) 0.25 MG tablet TAKE ONE TO TWO TABLETS BY MOUTH AT BEDTIME AS NEEDED FOR RESTLESS LEG 07/21/16   Birdie Sons, MD  rosuvastatin (CRESTOR) 40 MG tablet Take 1 tablet (40 mg total) by mouth daily. 05/15/16  Birdie Sons, MD  torsemide (DEMADEX) 20 MG tablet 2 tablets once or twice a day for swelling Patient taking differently: Take 40 mg by mouth 2 (two) times daily.  08/26/15   Birdie Sons, MD  ULORIC 80 MG TABS TAKE ONE (1) TABLET EACH DAY 02/19/16   Birdie Sons, MD  XARELTO 20 MG TABS tablet TAKE ONE (1) TABLET EACH DAY WITH SUPPER 12/02/16   Birdie Sons, MD    Family History  Problem Relation Age of Onset  . Hypertension Mother      Social History  Substance Use Topics  . Smoking status: Former Research scientist (life sciences)  . Smokeless tobacco: Never Used     Comment: Quit in 1990  . Alcohol use No    Allergies as of 12/02/2016 - Review Complete 12/02/2016  Allergen Reaction Noted  .  Decongestant  [oxymetazoline]  01/22/2015    Review of Systems:    All systems reviewed and negative except where noted in HPI.   Physical Exam:  Vital signs in last 24 hours: Temp:  [97.4 F (36.3 C)] 97.4 F (36.3 C) (07/11 1047) Pulse Rate:  [49-71] 61 (07/11 1047) Resp:  [16-25] 18 (07/11 1047) BP: (106-140)/(56-73) 106/56 (07/11 1047) SpO2:  [95 %-100 %] 99 % (07/11 1047) Weight:  [208 lb 8 oz (94.6 kg)-238 lb (108 kg)] 208 lb 8 oz (94.6 kg) (07/11 1047) Last BM Date: 12/01/16 General:   Pleasant, cooperative in NAD Head:  Normocephalic and atraumatic. Eyes:   No icterus.   Conjunctiva pink. PERRLA. Ears:  Normal auditory acuity. Neck:  Supple; no masses or thyroidomegaly Lungs: Respirations even and unlabored. Lungs clear to auscultation bilaterally.   No wheezes, crackles, or rhonchi.  Heart:  Regular rate and rhythm;  Without murmur, clicks, rubs or gallops Abdomen:  Soft, nondistended, nontender. Normal bowel sounds. No appreciable masses or hepatomegaly.  No rebound or guarding.  Rectal:  Not performed. Msk:  Symmetrical without gross deformities.    Extremities:  Without edema, cyanosis or clubbing. Neurologic:  Alert and oriented x2;  grossly normal neurologically. Skin:  Multiple bruises seen Cervical Nodes:  No significant cervical adenopathy. Psych:  Alert and cooperative. Normal affect.  LAB RESULTS:  Recent Labs  12/02/16 0737  WBC 12.9*  HGB 8.4*  HCT 25.5*  PLT 327   BMET  Recent Labs  12/02/16 0737  NA 135  K 3.5  CL 94*  CO2 24  GLUCOSE 131*  BUN 121*  CREATININE 3.98*  CALCIUM 7.8*   LFT  Recent Labs  12/02/16 0737  PROT 7.2  ALBUMIN 3.8  AST 177*  ALT 121*  ALKPHOS 83  BILITOT 1.0   PT/INR  Recent Labs  12/02/16 0851  LABPROT 46.2*  INR 4.79*    STUDIES: Dg Chest 2 View  Result Date: 12/02/2016 CLINICAL DATA:  Increased confusion.  Worsening over the past week. EXAM: CHEST  2 VIEW COMPARISON:  09/16/2015.  FINDINGS: Cardiac enlargement. Prior CABG. Vascular congestion without overt failure or consolidation. No osseous findings. IMPRESSION: Improved aeration. Cardiomegaly with vascular congestion but no frank edema. Electronically Signed   By: Staci Righter M.D.   On: 12/02/2016 08:42   Dg Forearm Left  Result Date: 12/02/2016 CLINICAL DATA:  Left forearm swelling after fall. EXAM: LEFT FOREARM - 2 VIEW COMPARISON:  None. FINDINGS: There is no evidence of fracture or other focal bone lesions. Soft tissues are unremarkable. IMPRESSION: Normal left forearm. Electronically Signed   By: Marijo Conception,  M.D.   On: 12/02/2016 08:42   Ct Head Wo Contrast  Result Date: 12/02/2016 CLINICAL DATA:  Increased confusion.  Worsening over the past week. EXAM: CT HEAD WITHOUT CONTRAST TECHNIQUE: Contiguous axial images were obtained from the base of the skull through the vertex without intravenous contrast. COMPARISON:  09/02/2016 MR. 09/03/2015 CT. FINDINGS: Brain: No evidence of acute infarction, hemorrhage, hydrocephalus, extra-axial collection or mass lesion/mass effect. Advanced atrophy. Chronic microvascular ischemic change. Vascular: Calcification of the cavernous internal carotid arteries consistent with cerebrovascular atherosclerotic disease. No signs of intracranial large vessel occlusion. Skull: Normal. Negative for fracture or focal lesion. Sinuses/Orbits: No acute finding. Other: None. IMPRESSION: Atrophy and small vessel disease.  No acute intracranial findings. Similar appearance to prior MR, when technique differences are considered. Electronically Signed   By: Staci Righter M.D.   On: 12/02/2016 08:37      Impression / Plan:   Vincent Mason is a 81 y.o. y/o male with Atrial fibrillation who is on a blood thinner and was admitted with a drop in hemoglobin from 11.2 down to 8.4. The patient was also found to have an elevated creatinine and BUN with heme-positive brown stools. The patient's last  colonoscopy was in 2014. The patient may need a GI workup which may include a repeat colonoscopy and an EGD. At the present time I would wait for the patient to improve medically in regard to his renal function and mental status. I would also monitor her hemoglobin and if it remained stable during his hospitalization then the further GI workup should be done as an outpatient. The patient has been explained the plan and agrees with it.   Thank you for involving me in the care of this patient.      LOS: 0 days   Vincent Lame, MD  12/02/2016, 1:18 PM   Note: This dictation was prepared with Dragon dictation along with smaller phrase technology. Any transcriptional errors that result from this process are unintentional.

## 2016-12-02 NOTE — Progress Notes (Signed)
[facility] responded to an OR for AD. On arrival, dc was in the Rm with pt. McConnell waited until dc finished his visit and visited pt. Pt who appeared disoriented, couldn't remember things, asked Garceno to comeback and educate him on advanced directives this afternoon when his wife is present. [place] to follow up with pt as needed.    12/02/16 1300  Clinical Encounter Type  Visited With Patient  Visit Type Initial;Other (Comment)  Referral From Nurse  Consult/Referral To Chaplain  Spiritual Encounters  Spiritual Needs Prayer

## 2016-12-02 NOTE — ED Provider Notes (Signed)
[facility] Emergency Department Provider Note    First MD Initiated Contact with Patient 12/02/16 0730     (approximate)  I have reviewed the triage vital signs and the nursing notes.   HISTORY  Chief Complaint Altered Mental Status    HPI Vincent Mason is a 81 y.o. male presents with a chief complaint of worsening confusion and frequent falls. Patient ill-appearing. Does have scattered bruises all over. Patient pleasant and communicative but does seem confused in lacking inside into why he is here in the ER. He denies any chest pain or worsening shortness of breath. States he is having worsening swelling to his legs.  He is status post bypass.   Past Medical History:  Diagnosis Date  . Acute encephalopathy 09/03/2015  . Atrial fibrillation and flutter [facility]) 2013   Dr Nehemiah Massed at [facility]  on [place].   . C. difficile colitis 09/12/2015  . COPD (chronic obstructive pulmonary disease) ([place])   . HCAP (healthcare-associated pneumonia) 09/12/2015  . Hyperlipidemia   . Hypertension   . Kidney disease, chronic, stage III (moderate, EGFR 30-59 ml/min)    ARF 2013  . OSA (obstructive sleep apnea)    non-compliant with CPAP.   Marland Kitchen Severe sepsis with septic shock ([place]) 09/12/2015   History reviewed. No pertinent family history. Past Surgical History:  Procedure Laterality Date  . CORONARY ARTERY BYPASS GRAFT  10/1997  . history of cervical discectomy  2009   C5-C6  . Hyperplastic colon polyp  02/2002   Sigmoid polyps   Patient Active Problem List   Diagnosis Date Noted  . Hypomagnesemia 09/14/2015  . Acute on chronic diastolic CHF (congestive heart failure), NYHA class 1 ([place]) 09/12/2015  . Chronic atrial fibrillation ([place]) 09/12/2015  . Essential hypertension 09/12/2015  . Fecal occult blood test positive 09/12/2015  . Nodular hyperplasia of liver   . Normocytic anemia 09/03/2015  . Opioid dependence ([place]) 09/03/2015  . Hypokalemia 08/26/2015  .  Hyperuricemia 07/09/2015  . Coronary artery disease 01/22/2015  . Chronic kidney disease (CKD), stage III (moderate) 01/22/2015  . Chronic constipation 01/22/2015  . COPD (chronic obstructive pulmonary disease) ([place]) 01/22/2015  . ED (erectile dysfunction) of organic origin 01/22/2015  . Lymphedema of both lower extremities 01/22/2015  . Fatigue 01/22/2015  . Blood in the urine 01/22/2015  . Calculus of kidney 01/22/2015  . Memory loss 01/22/2015  . Hypertensive pulmonary vascular disease ([place]) 01/22/2015  . Displacement of cervical intervertebral disc without myelopathy 01/22/2015  . Seizures ([place]) 01/17/2015  . Obstructive sleep apnea 01/17/2015  . Hyperlipidemia 01/17/2015  . Leg swelling 01/17/2015  . Degenerative disc disease, lumbar 01/17/2015  . Back pain 11/12/2014  . TI (tricuspid incompetence) 08/02/2014      Prior to Admission medications   Medication Sig Start Date End Date Taking? Authorizing Provider  [place] 125 MCG tablet  12/06/15   [provider]  metolazone (ZAROXOLYN) 5 MG tablet Take 4 tablets (20 mg total) by mouth daily. 11/24/16   Birdie Sons, MD  oxyCODONE (ROXICODONE) 15 MG immediate release tablet Take 1 tablet (15 mg total) by mouth every 6 (six) hours as needed for pain. 10/26/16   Birdie Sons, MD  potassium chloride SA (K-DUR,KLOR-CON) 20 MEQ tablet Take 1 tablet (20 mEq total) by mouth 2 (two) times daily. 06/18/16   Birdie Sons, MD  pramipexole (MIRAPEX) 0.25 MG tablet TAKE ONE TO TWO TABLETS BY MOUTH AT BEDTIME AS NEEDED FOR RESTLESS LEG 07/21/16  Birdie Sons, MD  ranitidine (ZANTAC) 150 MG tablet Take 1 tablet by mouth 2 (two) times daily as needed. Reported on 12/12/2015 08/11/13   [provider]  rivaroxaban (XARELTO) 20 MG TABS tablet Take 1 tablet (20 mg total) by mouth daily with supper. 04/24/16   Birdie Sons, MD  rosuvastatin (CRESTOR) 40 MG tablet Take 1 tablet (40 mg total) by mouth daily. 05/15/16   Birdie Sons, MD  torsemide (DEMADEX) 20 MG tablet 2 tablets once or twice a day for swelling Patient taking differently: Take 40 mg by mouth 2 (two) times daily.  08/26/15   Birdie Sons, MD  ULORIC 80 MG TABS TAKE ONE (1) TABLET EACH DAY 02/19/16   Birdie Sons, MD    Allergies Decongestant  [oxymetazoline]    Social History Social History  Substance Use Topics  . Smoking status: Former Research scientist (life sciences)  . Smokeless tobacco: Never Used     Comment: Quit in 1990  . Alcohol use No    Review of Systems Patient denies headaches, rhinorrhea, blurry vision, numbness, shortness of breath, chest pain, edema, cough, abdominal pain, nausea, vomiting, diarrhea, dysuria, fevers, rashes or hallucinations unless otherwise stated above in HPI. ____________________________________________   PHYSICAL EXAM:  VITAL SIGNS: Vitals:   12/02/16 0731  BP: 140/71  Pulse: 67  Temp: (!) 97.4 F (36.3 C)    Constitutional: Alert ill appearing butin no acute distress. Eyes: Conjunctivae are normal.  Head: Atraumatic. Nose: No congestion/rhinnorhea. Mouth/Throat: Mucous membranes are moist.   Neck: No stridor. Painless ROM.  Cardiovascular: Normal rate, regular rhythm. Grossly normal heart sounds.  Good peripheral circulation. Respiratory: Normal respiratory effort.  No retractions. Lungs CTAB. Gastrointestinal: Soft and nontender. No distention. No abdominal bruits. No CVA tenderness. Musculoskeletal: 3+ BLE edema Neurologic:  Slow to answer questions but appears appropriate. No gross focal neurologic deficits are appreciated. No facial droop Skin:  Skin is warm, dry and intact. No rash noted.  Scattered ecchymosis Psychiatric: Mood and affect are normal. Speech and behavior are normal.  ____________________________________________   LABS (all labs ordered are listed, but only abnormal results are displayed)  Results for orders placed or performed during the hospital encounter of 12/02/16 (from  the past 24 hour(s))  CBC with Differential/Platelet     Status: Abnormal   Collection Time: 12/02/16  7:37 AM  Result Value Ref Range   WBC 12.9 (H) 3.8 - 10.6 K/uL   RBC 2.78 (L) 4.40 - 5.90 MIL/uL   Hemoglobin 8.4 (L) 13.0 - 18.0 g/dL   HCT 25.5 (L) 40.0 - 52.0 %   MCV 91.7 80.0 - 100.0 fL   MCH 30.3 26.0 - 34.0 pg   MCHC 33.1 32.0 - 36.0 g/dL   RDW 18.0 (H) 11.5 - 14.5 %   Platelets 327 150 - 440 K/uL   Neutrophils Relative % 86 %   Lymphocytes Relative 8 %   Monocytes Relative 5 %   Eosinophils Relative 1 %   Basophils Relative 0 %   Band Neutrophils 0 %   Metamyelocytes Relative 0 %   Myelocytes 0 %   Promyelocytes Absolute 0 %   Blasts 0 %   nRBC 0 0 /100 WBC   Other 0 %   Neutro Abs 11.2 (H) 1.4 - 6.5 K/uL   Lymphs Abs 1.0 1.0 - 3.6 K/uL   Monocytes Absolute 0.6 0.2 - 1.0 K/uL   Eosinophils Absolute 0.1 0 - 0.7 K/uL   Basophils Absolute 0.0  0 - 0.1 K/uL   RBC Morphology MIXED RBC POPULATION   Comprehensive metabolic panel     Status: Abnormal   Collection Time: 12/02/16  7:37 AM  Result Value Ref Range   Sodium 135 135 - 145 mmol/L   Potassium 3.5 3.5 - 5.1 mmol/L   Chloride 94 (L) 101 - 111 mmol/L   CO2 24 22 - 32 mmol/L   Glucose, Bld 131 (H) 65 - 99 mg/dL   BUN 121 (H) 6 - 20 mg/dL   Creatinine, Ser 3.98 (H) 0.61 - 1.24 mg/dL   Calcium 7.8 (L) 8.9 - 10.3 mg/dL   Total Protein 7.2 6.5 - 8.1 g/dL   Albumin 3.8 3.5 - 5.0 g/dL   AST 177 (H) 15 - 41 U/L   ALT 121 (H) 17 - 63 U/L   Alkaline Phosphatase 83 38 - 126 U/L   Total Bilirubin 1.0 0.3 - 1.2 mg/dL   GFR calc non Af Amer 13 (L) >60 mL/min   GFR calc Af Amer 15 (L) >60 mL/min   Anion gap 17 (H) 5 - 15  Troponin I     Status: Abnormal   Collection Time: 12/02/16  7:37 AM  Result Value Ref Range   Troponin I 0.54 (HH) <0.03 ng/mL  Urinalysis, Complete w Microscopic     Status: Abnormal   Collection Time: 12/02/16  7:37 AM  Result Value Ref Range   Color, Urine YELLOW (A) YELLOW   APPearance CLEAR  (A) CLEAR   Specific Gravity, Urine 1.012 1.005 - 1.030   pH 6.0 5.0 - 8.0   Glucose, UA NEGATIVE NEGATIVE mg/dL   Hgb urine dipstick MODERATE (A) NEGATIVE   Bilirubin Urine NEGATIVE NEGATIVE   Ketones, ur NEGATIVE NEGATIVE mg/dL   Protein, ur 30 (A) NEGATIVE mg/dL   Nitrite NEGATIVE NEGATIVE   Leukocytes, UA NEGATIVE NEGATIVE   RBC / HPF 6-30 0 - 5 RBC/hpf   WBC, UA 0-5 0 - 5 WBC/hpf   Bacteria, UA RARE (A) NONE SEEN   Squamous Epithelial / LPF 0-5 (A) NONE SEEN   Mucous PRESENT    Hyaline Casts, UA PRESENT   Urine Drug Screen, Qualitative ([facility] only)     Status: Abnormal   Collection Time: 12/02/16  7:37 AM  Result Value Ref Range   Tricyclic, Ur Screen NONE DETECTED NONE DETECTED   Amphetamines, Ur Screen NONE DETECTED NONE DETECTED   MDMA (Ecstasy)Ur Screen NONE DETECTED NONE DETECTED   Cocaine Metabolite,Ur [place] NONE DETECTED NONE DETECTED   Opiate, Ur Screen POSITIVE (A) NONE DETECTED   Phencyclidine (PCP) Ur S NONE DETECTED NONE DETECTED   Cannabinoid 50 Ng, Ur [place] NONE DETECTED NONE DETECTED   Barbiturates, Ur Screen NONE DETECTED NONE DETECTED   Benzodiazepine, Ur Scrn NONE DETECTED NONE DETECTED   Methadone Scn, Ur NONE DETECTED NONE DETECTED  Brain natriuretic peptide     Status: Abnormal   Collection Time: 12/02/16  7:37 AM  Result Value Ref Range   B Natriuretic Peptide 307.0 (H) 0.0 - 100.0 pg/mL  CK     Status: Abnormal   Collection Time: 12/02/16  7:37 AM  Result Value Ref Range   Total CK 2,276 (H) 49 - 397 U/L  Ammonia     Status: None   Collection Time: 12/02/16  7:48 AM  Result Value Ref Range   Ammonia 19 9 - 35 umol/L  Digoxin level     Status: None   Collection Time: 12/02/16  7:48 AM  Result Value Ref Range   Digoxin Level 1.7 0.8 - 2.0 ng/mL  Protime-INR     Status: Abnormal   Collection Time: 12/02/16  8:51 AM  Result Value Ref Range   Prothrombin Time 46.2 (H) 11.4 - 15.2 seconds   INR 4.79 (HH)     ____________________________________________  EKG My review and personal interpretation at Time: 7:41   Indication: fall  Rate: 60  Rhythm: sinus Axis: normal Other: occasional pvc, normal intervals, ivcd ____________________________________________  RADIOLOGY  I personally reviewed all radiographic images ordered to evaluate for the above acute complaints and reviewed radiology reports and findings.  These findings were personally discussed with the patient.  Please see medical record for radiology report.  ____________________________________________   PROCEDURES  Procedure(s) performed:  Procedures    Critical Care performed: no ____________________________________________   INITIAL IMPRESSION / ASSESSMENT AND PLAN / ED COURSE  Pertinent labs & imaging results that were available during my care of the patient were reviewed by me and considered in my medical decision making (see chart for details).  DDX: trauma, abla, cardiorenal, chf, anasarca, renal failure  Vincent Mason is a 81 y.o. who presents to the ED with symptoms as described above. Patient ill-appearing but in no acute distress. Radiographic imaging ordered to evaluate for traumatic injury.  Blood work will be ordered to evaluate for above differential.  Clinical Course as of Dec 02 841  Wed Dec 02, 2016  0841 Trop elevated in the setting of acute aki with hyperuremia which would explain his encephalopathy.  EKG without evidence of stemi.    [PR]    Clinical Course User Index [PR] Merlyn Lot, MD   Patient with evidence of acute renal insufficiency as well as uremia which explain his acute encephalopathy.   Patient also with evidence of supratherapeutic. INR which explain the diffuse ecchymosis.  Have discussed with the patient and available family all diagnostics and treatments performed thus far and all questions were answered to the best of my ability. The patient demonstrates understanding and  agreement with plan.    ____________________________________________   FINAL CLINICAL IMPRESSION(S) / ED DIAGNOSES  Final diagnoses:  Arm pain  AKI (acute kidney injury) ([place])  [place] encephalopathy  Elevated troponin I level      NEW MEDICATIONS STARTED DURING THIS VISIT:  New Prescriptions   No medications on file     Note:  This document was prepared using Dragon voice recognition software and may include unintentional dictation errors.    Merlyn Lot, MD 12/02/16 [phone]

## 2016-12-02 NOTE — Consult Note (Signed)
[facility] Vascular Consult Note  MRN : [id number]  Vincent Mason is a 81 y.o. (1936/03/27) male who presents with chief complaint of  Chief Complaint  Patient presents with  . Altered Mental Status  .  History of Present Illness: I am asked to evaluate the patient by Dr. Candiss Norse for acute renal failure with volume overload. Dr. Candiss Norse came to special procedures specifically to discuss the case with me.    The patient presented to [facility] earlier today with the primary complaint of passing out and falling down. He notes that he has had a huge amount of swelling particularly of his legs as well as a tremendous increase in his weight. He has been undergoing Unna boot therapy for treating weeping wounds of his lower extremities secondary to his volume overload. He has a significant cardiomyopathy. He also has atrial fibrillation. In the emergency room he was found to have a creatinine of 3.9 with moderate anemia. At the time of my interview he is unable to lay flat in fact he is at greater than 45 upright  Current Facility-Administered Medications  Medication Dose Route Frequency Provider Last Rate Last Dose  . 0.9 %  sodium chloride infusion   Intravenous Continuous Loletha Grayer, MD 50 mL/hr at 12/02/16 1120    . acetaminophen (TYLENOL) tablet 650 mg  650 mg Oral Q6H PRN Loletha Grayer, MD       Or  . acetaminophen (TYLENOL) suppository 650 mg  650 mg Rectal Q6H PRN Wieting, Richard, MD      . Ampicillin-Sulbactam (UNASYN) 3 g in sodium chloride 0.9 % 100 mL IVPB  3 g Intravenous Q12H Ramond Dial, RPH   Stopped at 12/02/16 1230  . diclofenac sodium (VOLTAREN) 1 % transdermal gel 2 g  2 g Topical QID Murlean Iba, MD   2 g at 12/02/16 1600  . [START ON 12/03/2016] febuxostat (ULORIC) tablet 40 mg  40 mg Oral q morning - 10a Wieting, Richard, MD      . oxyCODONE (Oxy IR/ROXICODONE) immediate release tablet 5 mg  5 mg Oral Q6H PRN Loletha Grayer, MD    5 mg at 12/02/16 1131  . pantoprazole (PROTONIX) injection 40 mg  40 mg Intravenous Q12H Loletha Grayer, MD   40 mg at 12/02/16 1120  . pramipexole (MIRAPEX) tablet 0.125 mg  0.125 mg Oral QHS PRN Loletha Grayer, MD        Past Medical History:  Diagnosis Date  . Acute encephalopathy 09/03/2015  . Atrial fibrillation and flutter [facility]) 2013   Dr Nehemiah Massed at [facility]  on [address].   . C. difficile colitis 09/12/2015  . COPD (chronic obstructive pulmonary disease) ([place])   . HCAP (healthcare-associated pneumonia) 09/12/2015  . Hyperlipidemia   . Hypertension   . Kidney disease, chronic, stage III (moderate, EGFR 30-59 ml/min)    ARF 2013  . OSA (obstructive sleep apnea)    non-compliant with CPAP.   Marland Kitchen Severe sepsis with septic shock ([place]) 09/12/2015    Past Surgical History:  Procedure Laterality Date  . CATARACT EXTRACTION    . CORONARY ARTERY BYPASS GRAFT  10/1997  . history of cervical discectomy  2009   C5-C6  . Hyperplastic colon polyp  02/2002   Sigmoid polyps    Social History Social History  Substance Use Topics  . Smoking status: Former Research scientist (life sciences)  . Smokeless tobacco: Never Used     Comment: Quit in 1990  . Alcohol use No  Family History Family History  Problem Relation Age of Onset  . Hypertension Mother   No family history of bleeding/clotting disorders, porphyria or autoimmune disease   Allergies  Allergen Reactions  . Decongestant  [Oxymetazoline]      REVIEW OF SYSTEMS (Negative unless checked)  Constitutional: [] Weight loss  [] Fever  [] Chills Cardiac: [] Chest pain   [] Chest pressure   [] Palpitations   [x] Shortness of breath when laying flat   [x] Shortness of breath at rest   [x] Shortness of breath with exertion. Vascular:  [] Pain in legs with walking   [] Pain in legs at rest   [] Pain in legs when laying flat   [] Claudication   [] Pain in feet when walking  [] Pain in feet at rest  [] Pain in feet when laying flat   [] History of DVT   [] Phlebitis    [x] Swelling in legs   [] Varicose veins   [] Non-healing ulcers Pulmonary:   [] Uses home oxygen   [] Productive cough   [] Hemoptysis   [] Wheeze  [] COPD   [] Asthma Neurologic:  [] Dizziness  [] Blackouts   [] Seizures   [] History of stroke   [] History of TIA  [] Aphasia   [] Temporary blindness   [] Dysphagia   [] Weakness or numbness in arms   [] Weakness or numbness in legs Musculoskeletal:  [] Arthritis   [] Joint swelling   [] Joint pain   [] Low back pain Hematologic:  [] Easy bruising  [] Easy bleeding   [] Hypercoagulable state   [] Anemic  [] Hepatitis Gastrointestinal:  [] Blood in stool   [] Vomiting blood  [] Gastroesophageal reflux/heartburn   [] Difficulty swallowing. Genitourinary:  [x] Chronic kidney disease   [] Difficult urination  [] Frequent urination  [] Burning with urination   [] Blood in urine Skin:  [] Rashes   [] Ulcers   [] Wounds Psychological:  [] History of anxiety   []  History of major depression.  Physical Examination  Vitals:   12/02/16 1047 12/02/16 1629 12/02/16 1632 12/02/16 1634  BP: (!) 106/56 (!) 98/58 (!) 101/55 (!) 105/43  Pulse: 61 65 60 60  Resp: 18     Temp: (!) 97.4 F (36.3 C)     TempSrc: Oral     SpO2: 99%     Weight: 94.6 kg (208 lb 8 oz)     Height: 5' 10"  (1.778 m)      Body mass index is 29.92 kg/m. Gen:  WD/WN, NAD Head: [place]/AT, No temporalis wasting. Prominent temp pulse not noted. Ear/Nose/Throat: Hearing grossly intact, nares w/o erythema or drainage, oropharynx w/o Erythema/Exudate Eyes: Sclera non-icteric, conjunctiva clear Neck: Trachea midline.  No JVD.  Pulmonary:  Good air movement, respirations not labored, equal bilaterally.  Cardiac: RRR, normal S1, S2. Vascular: There is 4+ pitting edema bilateral lower extremities. There is extensive ecchymoses of the right hip area. Vessel Right Left  Radial Palpable Palpable  PT Palpable Palpable  DP Palpable Palpable  Gastrointestinal: soft, non-tender/non-distended. No guarding/reflex.  Musculoskeletal: M/S  4/5 throughout.  Extremities without ischemic changes.  No deformity or atrophy. No edema. Neurologic: Sensation grossly intact in extremities.  Symmetrical.  Speech is fluent. Motor exam as listed above. Psychiatric: Judgment intact, Mood & affect appropriate for pt's clinical situation. Dermatologic: No rashes or ulcers noted.  No cellulitis or open wounds. Lymph : No Cervical, Axillary, or Inguinal lymphadenopathy.      CBC Lab Results  Component Value Date   WBC 12.9 (H) 12/02/2016   HGB 8.4 (L) 12/02/2016   HCT 25.5 (L) 12/02/2016   MCV 91.7 12/02/2016   PLT 327 12/02/2016    BMET  Component Value Date/Time   NA 135 12/02/2016 0737   NA 141 10/14/2016 1036   NA 136 06/30/2014 0657   K 3.5 12/02/2016 0737   K 3.6 06/30/2014 0657   CL 94 (L) 12/02/2016 0737   CL 102 06/30/2014 0657   CO2 24 12/02/2016 0737   CO2 27 06/30/2014 0657   GLUCOSE 131 (H) 12/02/2016 0737   GLUCOSE 131 (H) 06/30/2014 0657   BUN 121 (H) 12/02/2016 0737   BUN 28 (H) 10/14/2016 1036   BUN 41 (H) 06/30/2014 0657   CREATININE 3.98 (H) 12/02/2016 0737   CREATININE 1.61 (H) 06/30/2014 0657   CALCIUM 7.8 (L) 12/02/2016 0737   CALCIUM 8.6 06/30/2014 0657   GFRNONAA 13 (L) 12/02/2016 0737   GFRNONAA 44 (L) 06/30/2014 0657   GFRAA 15 (L) 12/02/2016 0737   GFRAA 54 (L) 06/30/2014 0657   Estimated Creatinine Clearance: 17.4 mL/min (A) (by C-G formula based on SCr of 3.98 mg/dL (H)).  COAG Lab Results  Component Value Date   INR 4.79 (HH) 12/02/2016   INR 1.46 09/11/2015   INR 2.05 (H) 09/04/2015    Radiology Dg Chest 2 View  Result Date: 12/02/2016 CLINICAL DATA:  Increased confusion.  Worsening over the past week. EXAM: CHEST  2 VIEW COMPARISON:  09/16/2015. FINDINGS: Cardiac enlargement. Prior CABG. Vascular congestion without overt failure or consolidation. No osseous findings. IMPRESSION: Improved aeration. Cardiomegaly with vascular congestion but no frank edema. Electronically Signed    By: Staci Righter M.D.   On: 12/02/2016 08:42   Dg Forearm Left  Result Date: 12/02/2016 CLINICAL DATA:  Left forearm swelling after fall. EXAM: LEFT FOREARM - 2 VIEW COMPARISON:  None. FINDINGS: There is no evidence of fracture or other focal bone lesions. Soft tissues are unremarkable. IMPRESSION: Normal left forearm. Electronically Signed   By: Marijo Conception, M.D.   On: 12/02/2016 08:42   Ct Head Wo Contrast  Result Date: 12/02/2016 CLINICAL DATA:  Increased confusion.  Worsening over the past week. EXAM: CT HEAD WITHOUT CONTRAST TECHNIQUE: Contiguous axial images were obtained from the base of the skull through the vertex without intravenous contrast. COMPARISON:  09/02/2016 MR. 09/03/2015 CT. FINDINGS: Brain: No evidence of acute infarction, hemorrhage, hydrocephalus, extra-axial collection or mass lesion/mass effect. Advanced atrophy. Chronic microvascular ischemic change. Vascular: Calcification of the cavernous internal carotid arteries consistent with cerebrovascular atherosclerotic disease. No signs of intracranial large vessel occlusion. Skull: Normal. Negative for fracture or focal lesion. Sinuses/Orbits: No acute finding. Other: None. IMPRESSION: Atrophy and small vessel disease.  No acute intracranial findings. Similar appearance to prior MR, when technique differences are considered. Electronically Signed   By: Staci Righter M.D.   On: 12/02/2016 08:37      Assessment/Plan 1. Acute renal insufficiency on chronic stage III renal disease. The patient will require emergent dialysis to treat his volume overload. Risks and benefits for insertion of the catheter were reviewed the patient has agreed to proceed and a temporary dialysis catheter will be placed. Pending the results of this therapy he may require a tunneled catheter but this will be determined at a later date.  2. Drop in hemoglobin with elevated BUN. Patient is having brown stool which was guaiac positive. Unclear if GI bleed  or not. Serial hemoglobins. GI consultation. Empiric Protonix. Stop Xaralto.  3. History of atrial fibrillation stroke risk will be higher off Xaralto. Hold digoxin for right now also with acute kidney injury.  4. Anasarca, lower extremity ulcerations. Empiric Unasyn. Wound  care nurse to evaluate for replacing Unna boots.  5. Elevated troponin not quite sure what to make of this likely demand ischemia or false positive for chronic kidney disease. no complaints of chest pain.  6. History of COPD respiratory status is stable  7. Obstructive sleep apnea  8. Hyperlipidemia unspecified on Crestor. Check a CPK prior to restarting Crestor.   Hortencia Pilar, MD  12/02/2016 4:52 PM    This note was created with Dragon medical transcription system.  Any error is purely unintentional

## 2016-12-02 NOTE — Op Note (Signed)
  OPERATIVE NOTE   PROCEDURE: 1. Insertion of temporary dialysis catheter catheter right femoral approach.  PRE-OPERATIVE DIAGNOSIS: Acute on chronic renal insufficiency requiring hemodialysis  POST-OPERATIVE DIAGNOSIS: Same  SURGEON: Katha Cabal M.D.  ANESTHESIA: 1% lidocaine local infiltration  ESTIMATED BLOOD LOSS: Minimal cc  INDICATIONS:   Vincent Mason is a 81 y.o. male who presents with acute on chronic renal insufficiency. He has developed anasarca which is unresponsive to diuretic therapy. He therefore is going to begin ultrafiltration. Risks and benefits were reviewed all questions answered temporary dialysis catheter will be inserted..  DESCRIPTION: After obtaining full informed written consent, the patient was positioned supine. The right was prepped and draped in a sterile fashion. Ultrasound was placed in a sterile sleeve. Ultrasound was utilized to identify the right common femoral vein which is noted to be echolucent and compressible indicating patency. Images recorded for the permanent record. Under real-time visualization a Seldinger needle is inserted into the vein and the guidewires advanced without difficulty. Small counterincision was made at the wire insertion site. Dilator is passed over the wire and the temporary dialysis catheter catheter is fed over the wire without difficulty.  All lumens aspirate and flush easily and are packed with heparin saline. Catheter secured to the skin of the right thigh with 2-0 silk. A sterile dressing is applied with Biopatch.  COMPLICATIONS: None  CONDITION: Unchanged  Hortencia Pilar Office:  [phone] 12/02/2016, 5:08 PM

## 2016-12-02 NOTE — Plan of Care (Signed)
Problem: Food- and Nutrition-Related Knowledge Deficit (NB-1.1) Goal: Nutrition education Formal process to instruct or train a patient/client in a skill or to impart knowledge to help patients/clients voluntarily manage or modify food choices and eating behavior to maintain or improve health.  Outcome: Completed/Met Date Met: 12/02/16 Nutrition Education Note  RD consulted for nutrition education regarding new onset CHF.  Spoke with patient's wife. Patient currently out of the room for procedure, but his wife reports he is not appropriate for education at this time, anyway. He has had altered mental status for the past week per wife report. She reports he has never followed a special diet before. She reports his UBW is 180-190 lbs and he has been gaining a significant amount of weight from fluid lately. Wife reports patient enjoys potato soup and vegetable soup and other higher-sodium foods. Discussed choosing low-sodium or sodium-free options or preparing his soup at home.  RD provided "Low Sodium Nutrition Therapy" handout from the Academy of Nutrition and Dietetics. Reviewed patient's typical intake with wife. Provided examples on ways to decrease sodium intake in diet. Discouraged intake of processed foods and use of salt shaker. Encouraged fresh fruits and vegetables. Encouraged preparing foods at home as foods from restaurants can be higher in sodium. Teach back method used.  Expect good compliance.  Body mass index is 29.92 kg/m. Pt meets criteria for overweight based on current BMI.  Current diet order is Heart Healthy, patient is consuming approximately 100% of meals at this time. Labs and medications reviewed. No further nutrition interventions warranted at this time. RD contact information provided. If additional nutrition issues arise, please re-consult RD.   Willey Blade, MS, RD, LDN Pager: [phone] After Hours Pager: [phone]

## 2016-12-02 NOTE — Progress Notes (Signed)
HD COMPLETED  

## 2016-12-03 ENCOUNTER — Inpatient Hospital Stay
Admission: EM | Disposition: A | Discharge status: Home Health | Payer: Self-pay | Source: Home / Self Care | Attending: Internal Medicine

## 2016-12-03 LAB — HEPATITIS B SURFACE ANTIBODY, QUANTITATIVE: Hepatitis B-Post: <3.1 m[IU]/mL — ABNORMAL LOW (ref >9.9)

## 2016-12-03 LAB — HEPATITIS B SURFACE ANTIGEN: Hepatitis B Surface Ag: NEGATIVE

## 2016-12-03 LAB — HEPATITIS B CORE ANTIBODY, TOTAL: Hep B Core Total Ab: NEGATIVE

## 2016-12-03 SURGERY — COLONOSCOPY WITH PROPOFOL
Anesthesia: General

## 2016-12-03 MED ORDER — OXYCODONE HCL 5 MG PO TABS
5.0000 mg | ORAL_TABLET | ORAL | Status: DC | PRN
  Administered 2016-12-03 – 2016-12-07 (×18): 5 mg via ORAL
  Filled 2016-12-03 (×18): qty 1

## 2016-12-03 MED ORDER — CEFTRIAXONE SODIUM 1 G IJ SOLR
1.0000 g | INTRAMUSCULAR | Status: AC
  Administered 2016-12-03 – 2016-12-04 (×2): 1 g via INTRAVENOUS
  Filled 2016-12-03 (×2): qty 10

## 2016-12-03 MED ORDER — POLYETHYLENE GLYCOL 3350 17 G PO PACK
17.0000 g | PACK | Freq: Every day | ORAL | Status: DC
  Administered 2016-12-03 – 2016-12-07 (×4): 17 g via ORAL
  Filled 2016-12-03 (×5): qty 1

## 2016-12-03 MED ORDER — CEFTRIAXONE SODIUM-DEXTROSE 1-3.74 GM-% IV SOLR
1.0000 g | Freq: Every day | INTRAVENOUS | Status: DC
  Filled 2016-12-03: qty 50

## 2016-12-03 MED ORDER — TUBERCULIN PPD 5 UNIT/0.1ML ID SOLN
5.0000 [IU] | Freq: Once | INTRADERMAL | Status: AC
  Administered 2016-12-03: 5 [IU] via INTRADERMAL
  Filled 2016-12-03: qty 0.1

## 2016-12-03 NOTE — Progress Notes (Signed)
Pre-hemodialysis 

## 2016-12-03 NOTE — Progress Notes (Signed)
Post hemodialysis 

## 2016-12-03 NOTE — Progress Notes (Signed)
Hemodialysis started

## 2016-12-03 NOTE — Progress Notes (Signed)
[facility], [place] 12/03/16  Subjective:   Patient known to our practice from December 2015. He presents via EMS for increasing confusion.  He also has massive edema of his legs, abdomen and testicles.   Room air today 1500 cc removed with HD States knee continues to hurt   Objective:  Vital signs in last 24 hours:  Temp:  [97.3 F (36.3 C)-98.1 F (36.7 C)] 97.3 F (36.3 C) (07/12 0458) Pulse Rate:  [41-66] 41 (07/12 0500) Resp:  [16-22] 20 (07/12 0458) BP: (89-119)/(43-63) 116/59 (07/12 0500) SpO2:  [80 %-100 %] 80 % (07/12 0500) Weight:  [94.6 kg (208 lb 8 oz)] 94.6 kg (208 lb 8 oz) (07/11 1047)  Weight change:  Filed Weights   12/02/16 0731 12/02/16 1047  Weight: 108 kg (238 lb) 94.6 kg (208 lb 8 oz)    Intake/Output:    Intake/Output Summary (Last 24 hours) at 12/03/16 4098 Last data filed at 12/03/16 1191  Gross per 24 hour  Intake             1100 ml  Output             2750 ml  Net            -1650 ml     Physical Exam: General: No acute distress, laying in the bed  HEENT Anicteric, moist oral mucous membranes  Neck supple  Pulm/lungs Decreased breath sounds at bases  CVS/Heart Irregular, soft systolic murmur  Abdomen:  Soft, mildly distended  Extremities: Massive edema bilaterally up to the thighs and lower abdomen  Neurologic: Alert, able to follow commands  Skin: No acute rashes  Access: To be placed       Basic Metabolic Panel:   Recent Labs Lab 12/02/16 0737  NA 135  K 3.5  CL 94*  CO2 24  GLUCOSE 131*  BUN 121*  CREATININE 3.98*  CALCIUM 7.8*     CBC:  Recent Labs Lab 12/02/16 0737  WBC 12.9*  NEUTROABS 11.2*  HGB 8.4*  HCT 25.5*  MCV 91.7  PLT 327      Lab Results  Component Value Date   HEPBSAG Negative 12/02/2016   HEPBSAB Reactive 09/11/2015   HEPBIGM Negative 10/10/2015      Microbiology:  Recent Results (from the past 240 hour(s))  Aerobic/Anaerobic Culture  (surgical/deep wound)     Status: None (Preliminary result)   Collection Time: 12/02/16 12:53 PM  Result Value Ref Range Status   Specimen Description LEG LEFT  Final   Special Requests Normal  Final   Gram Stain   Final    RARE WBC PRESENT,BOTH PMN AND MONONUCLEAR ABUNDANT GRAM NEGATIVE RODS RARE GRAM POSITIVE COCCI IN PAIRS RARE GRAM VARIABLE ROD Performed at [facility], [place] [address] [place], [place] [address]    Culture PENDING  Incomplete   Report Status PENDING  Incomplete    Coagulation Studies:  Recent Labs  12/02/16 0851  LABPROT 46.2*  INR 4.79*    Urinalysis:  Recent Labs  12/02/16 0737  COLORURINE YELLOW*  LABSPEC 1.012  PHURINE 6.0  GLUCOSEU NEGATIVE  HGBUR MODERATE*  BILIRUBINUR NEGATIVE  KETONESUR NEGATIVE  PROTEINUR 30*  NITRITE NEGATIVE  LEUKOCYTESUR NEGATIVE      Imaging: Dg Chest 2 View  Result Date: 12/02/2016 CLINICAL DATA:  Increased confusion.  Worsening over the past week. EXAM: CHEST  2 VIEW COMPARISON:  09/16/2015. FINDINGS: Cardiac enlargement. Prior CABG. Vascular congestion without overt failure or  consolidation. No osseous findings. IMPRESSION: Improved aeration. Cardiomegaly with vascular congestion but no frank edema. Electronically Signed   By: Staci Righter M.D.   On: 12/02/2016 08:42   Dg Forearm Left  Result Date: 12/02/2016 CLINICAL DATA:  Left forearm swelling after fall. EXAM: LEFT FOREARM - 2 VIEW COMPARISON:  None. FINDINGS: There is no evidence of fracture or other focal bone lesions. Soft tissues are unremarkable. IMPRESSION: Normal left forearm. Electronically Signed   By: Marijo Conception, M.D.   On: 12/02/2016 08:42   Ct Head Wo Contrast  Result Date: 12/02/2016 CLINICAL DATA:  Increased confusion.  Worsening over the past week. EXAM: CT HEAD WITHOUT CONTRAST TECHNIQUE: Contiguous axial images were obtained from the base of the skull through the vertex without intravenous contrast. COMPARISON:  09/02/2016 MR.  09/03/2015 CT. FINDINGS: Brain: No evidence of acute infarction, hemorrhage, hydrocephalus, extra-axial collection or mass lesion/mass effect. Advanced atrophy. Chronic microvascular ischemic change. Vascular: Calcification of the cavernous internal carotid arteries consistent with cerebrovascular atherosclerotic disease. No signs of intracranial large vessel occlusion. Skull: Normal. Negative for fracture or focal lesion. Sinuses/Orbits: No acute finding. Other: None. IMPRESSION: Atrophy and small vessel disease.  No acute intracranial findings. Similar appearance to prior MR, when technique differences are considered. Electronically Signed   By: Staci Righter M.D.   On: 12/02/2016 08:37     Medications:   . sodium chloride 50 mL/hr at 12/02/16 1120  . ampicillin-sulbactam (UNASYN) IV Stopped (12/03/16 0120)   . diclofenac sodium  2 g Topical QID  . febuxostat  40 mg Oral q morning - 10a  . pantoprazole (PROTONIX) IV  40 mg Intravenous Q12H   acetaminophen **OR** acetaminophen, oxyCODONE, pramipexole  Assessment/ Plan:  81 y.o.caucasian male With atrial fibrillation, pulmonary hypertension, severe tricuspid regurgitation, obstructive sleep apnea, hypertension, COPD, chronic kidney disease  1.  Acute renal failure, likely cardiorenal syndrome 2.  Chronic kidney disease stage 3, baseline creatinine 1.85/GFR 34 in April 2017 3.  Severe anasarca 4.  Severe tricuspid regurgitation, dilated right atrium 5.  Pulmonary hypertension 6.  Coagulopathy with INR 4.8  Patient appears to have developed anasarca due to cardiorenal syndrome. He has experienced diuretic failure.  Due to pulmonary hypertension, low normal blood pressure, intravenous diuretics might not be effective.    Plan: Low salt diet Dialysis today for volume removal     LOS: 1 [facility] 7/12/20189:39 [employer], [place]

## 2016-12-03 NOTE — Care Management (Signed)
RNCM attempted to meet with patient but Nephrologist and nursing was at bedside. I introduced myself and left home health list at patient's bedside. I explained that CM would return to visit with him. PT is recommending SNF at this time.

## 2016-12-03 NOTE — Care Management (Signed)
RNCM attempted to meet with patient regarding home health services but patient was off unit in hemodialysis which is acute. Per RN patient had PPD placed; Estill Bamberg with [facility] updated. Per CSW note patient is refusing SNF. I would recommend MD adding HHPT, HHRN (wound care/medication management/lab draws), HHSW (for placement/family dynamics), and HHNA to discharge plan. Per CSW note patient will be home alone at times.

## 2016-12-03 NOTE — Progress Notes (Signed)
Hemodialysis completed. 

## 2016-12-03 NOTE — Clinical Social Work Note (Signed)
Clinical Social Work Assessment  Patient Details  Name: DSHAWN MCNAY MRN: [id number] Date of Birth: January 15, 1936  Date of referral:  12/03/16               Reason for consult:  Facility Placement                Permission sought to share information with:  Family Supports Permission granted to share information::  Yes, Verbal Permission Granted  Name::        Agency::     Relationship::     Contact Information:     Housing/Transportation Living arrangements for the past 2 months:  Single Family Home Source of Information:  Patient, Spouse Patient Interpreter Needed:  None Criminal Activity/Legal Involvement Pertinent to Current Situation/Hospitalization:  No - Comment as needed Significant Relationships:  None Lives with:  Spouse Do you feel safe going back to the place where you live?  Yes Need for family participation in patient care:  Yes (Comment)  Care giving concerns:  Patient resides at home with his wife.   Social Worker assessment / plan:  CSW received consult stating PT recommending STR. CSW spoke with patient and his wife this morning. CSW explained role and purpose of visit. Patient spent most of his time complaining after that regarding the care he had received at [facility] a long time ago. Patient stated he would not do rehab again and only wants home health. CSW looked at patient's wife and asked if she felt comfortable with his decision. Patient's wife immediately said she did not feel comfortable and patient spoke up and said, "By the time I come home you will be comfortable." She said she would not be there all the time and patient's response was "you don't have to be there all the time, in fact you don't have to be there at all." Patient's wife did not respond other than with a facial expression. CSW informed both that if the decision changed and he wanted to pursue rehab, to let the nursing staff know so that they can let CSW know.  Employment status:   Retired Forensic scientist:  Medicare PT Recommendations:  [place] / Referral to community resources:     Patient/Family's Response to care:  Patient and wife expressed appreciation for CSW visit.  Patient/Family's Understanding of and Emotional Response to Diagnosis, Current Treatment, and Prognosis:  Patient is aware of his limitations but is not concerned about his wife and the toll it will have on her. Patient's wife does what her husband tells her to do.  Emotional Assessment Appearance:  Appears younger than stated age Attitude/Demeanor/Rapport:  Complaining, Self-Absorbed Affect (typically observed):  Blunt Orientation:  Oriented to Self, Oriented to Place, Oriented to  Time, Oriented to Situation Alcohol / Substance use:  Not Applicable Psych involvement (Current and /or in the community):  No (Comment)  Discharge Needs  Concerns to be addressed:  Care Coordination Readmission within the last 30 days:  No Current discharge risk:  None Barriers to Discharge:  No Barriers Identified   Shela Leff, LCSW 12/03/2016, 11:15 AM

## 2016-12-03 NOTE — Progress Notes (Signed)
PPD placed in Left FA today.

## 2016-12-03 NOTE — Progress Notes (Signed)
Patient ID: Vincent Mason, male   DOB: Feb 24, 1936, 81 y.o.   MRN: [id number]  [facility] PROGRESS NOTE  TRYSTON GILLIAM [id number] DOB: 01-19-1936 DOA: 12/02/2016 PCP: Birdie Sons, MD  HPI/Subjective: Patient with lots of complaints this morning. States his right knee is painful. He feels very weak. States the food was not good.  Objective: Vitals:   12/03/16 1500 12/03/16 1530  BP: 116/62 (!) 105/59  Pulse: (!) 56 (!) 57  Resp: (!) 23 15  Temp:      Filed Weights   12/02/16 0731 12/02/16 1047 12/03/16 1345  Weight: 108 kg (238 lb) 94.6 kg (208 lb 8 oz) 94.9 kg (209 lb 3.5 oz)    ROS: Review of Systems  Constitutional: Negative for chills and fever.  Eyes: Negative for blurred vision.  Respiratory: Positive for cough and shortness of breath.   Cardiovascular: Negative for chest pain.  Gastrointestinal: Positive for abdominal pain. Negative for constipation, diarrhea, nausea and vomiting.  Genitourinary: Negative for dysuria.  Musculoskeletal: Positive for joint pain.  Neurological: Positive for dizziness. Negative for headaches.   Exam: Physical Exam  Constitutional: He is oriented to person, place, and time.  HENT:  Nose: No mucosal edema.  Mouth/Throat: No oropharyngeal exudate or posterior oropharyngeal edema.  Eyes: Pupils are equal, round, and reactive to light. Conjunctivae, EOM and lids are normal.  Neck: No JVD present. Carotid bruit is not present. No edema present. No thyroid mass and no thyromegaly present.  Cardiovascular: S1 normal and S2 normal.  Bradycardia present.  Exam reveals no gallop.   No murmur heard. Pulses:      Dorsalis pedis pulses are 2+ on the right side, and 2+ on the left side.  Respiratory: No respiratory distress. He has no wheezes. He has no rhonchi. He has no rales.  GI: Soft. Bowel sounds are normal. There is no tenderness.  Musculoskeletal:       Right ankle: He exhibits swelling.       Left ankle: He exhibits swelling.   Lymphadenopathy:    He has no cervical adenopathy.  Neurological: He is alert and oriented to person, place, and time. No cranial nerve deficit.  Skin: Skin is warm. Nails show no clubbing.  2 left lower extremity ulcerations with slight drainage. Erythema bilateral lower extremities  Psychiatric: He has a normal mood and affect.      Data Reviewed: Basic Metabolic Panel:  Recent Labs Lab 12/02/16 0737  NA 135  K 3.5  CL 94*  CO2 24  GLUCOSE 131*  BUN 121*  CREATININE 3.98*  CALCIUM 7.8*   Liver Function Tests:  Recent Labs Lab 12/02/16 0737  AST 177*  ALT 121*  ALKPHOS 83  BILITOT 1.0  PROT 7.2  ALBUMIN 3.8   Recent Labs Lab 12/02/16 0748  AMMONIA 19   CBC:  Recent Labs Lab 12/02/16 0737  WBC 12.9*  NEUTROABS 11.2*  HGB 8.4*  HCT 25.5*  MCV 91.7  PLT 327   Cardiac Enzymes:  Recent Labs Lab 12/02/16 0737  CKTOTAL 2,276*  TROPONINI 0.54*   BNP (last 3 results)  Recent Labs  12/02/16 0737  BNP 307.0*     Recent Results (from the past 240 hour(s))  Aerobic/Anaerobic Culture (surgical/deep wound)     Status: None (Preliminary result)   Collection Time: 12/02/16 12:53 PM  Result Value Ref Range Status   Specimen Description LEG LEFT  Final   Special Requests Normal  Final   Gram Stain  Final    RARE WBC PRESENT,BOTH PMN AND MONONUCLEAR ABUNDANT GRAM NEGATIVE RODS RARE GRAM POSITIVE COCCI IN PAIRS RARE GRAM VARIABLE ROD    Culture   Final    ABUNDANT PROVIDENCIA RETTGERI SUSCEPTIBILITIES TO FOLLOW Performed at [facility], [place] [address] [place], [place] [address]    Report Status PENDING  Incomplete     Studies: Dg Chest 2 View  Result Date: 12/02/2016 CLINICAL DATA:  Increased confusion.  Worsening over the past week. EXAM: CHEST  2 VIEW COMPARISON:  09/16/2015. FINDINGS: Cardiac enlargement. Prior CABG. Vascular congestion without overt failure or consolidation. No osseous findings. IMPRESSION: Improved aeration.  Cardiomegaly with vascular congestion but no frank edema. Electronically Signed   By: Staci Righter M.D.   On: 12/02/2016 08:42   Dg Forearm Left  Result Date: 12/02/2016 CLINICAL DATA:  Left forearm swelling after fall. EXAM: LEFT FOREARM - 2 VIEW COMPARISON:  None. FINDINGS: There is no evidence of fracture or other focal bone lesions. Soft tissues are unremarkable. IMPRESSION: Normal left forearm. Electronically Signed   By: Marijo Conception, M.D.   On: 12/02/2016 08:42   Ct Head Wo Contrast  Result Date: 12/02/2016 CLINICAL DATA:  Increased confusion.  Worsening over the past week. EXAM: CT HEAD WITHOUT CONTRAST TECHNIQUE: Contiguous axial images were obtained from the base of the skull through the vertex without intravenous contrast. COMPARISON:  09/02/2016 MR. 09/03/2015 CT. FINDINGS: Brain: No evidence of acute infarction, hemorrhage, hydrocephalus, extra-axial collection or mass lesion/mass effect. Advanced atrophy. Chronic microvascular ischemic change. Vascular: Calcification of the cavernous internal carotid arteries consistent with cerebrovascular atherosclerotic disease. No signs of intracranial large vessel occlusion. Skull: Normal. Negative for fracture or focal lesion. Sinuses/Orbits: No acute finding. Other: None. IMPRESSION: Atrophy and small vessel disease.  No acute intracranial findings. Similar appearance to prior MR, when technique differences are considered. Electronically Signed   By: Staci Righter M.D.   On: 12/02/2016 08:37    Scheduled Meds: . diclofenac sodium  2 g Topical QID  . febuxostat  40 mg Oral q morning - 10a  . pantoprazole (PROTONIX) IV  40 mg Intravenous Q12H  . polyethylene glycol  17 g Oral Daily  . tuberculin  5 Units Intradermal Once   Continuous Infusions: . sodium chloride 50 mL/hr at 12/02/16 1120  . cefTRIAXone (ROCEPHIN)  IV      Assessment/Plan:  1. Acute kidney injury on chronic kidney disease stage III. Continue to hold Demadex and  Zaroxolyn. Patient on gentle IV fluid hydration. Nephrology starting dialysis today. 2. Acute blood loss anemia. Patient could have bled into his skin with all the bruising that he has. Patient also guaiac positive. Empiric Protonix. Stopped Xaralto. 3. Anasarca, lower extremity ulcerations. Switch antibiotics over to Rocephin. Wound care evaluation. 4. Acute rhabdomyolysis. CPK 2000 on presentation. Gentle IV fluid hydration. Discontinue Crestor. Dialysis should also help. 5. History of atrial fibrillation. Risk of stroke will be higher off Xaralto. Hold digoxin right now with acute kidney injury. Check a digoxin level tomorrow 6. Elevated troponin. Likely demand ischemia or false positive with chronic kidney disease. 7. History of obstructive sleep apnea 8. Hyperlipidemia unspecified. Hold Crestor at this time with rhabdomyolysis 9. Weakness. Physical therapy evaluation. They will be limited with catheter in his groin  Code Status:     Code Status Orders        Start     Ordered   12/02/16 0938  Full code  Continuous  12/02/16 5041    Code Status History    Date Active Date Inactive Code Status Order ID Comments User Context   09/03/2015  5:58 AM 09/17/2015  4:37 PM Full Code [id number]  Lucious Groves, DO Inpatient   01/17/2015 10:56 AM 01/18/2015  1:52 PM Full Code [id number]  Theodoro Grist, MD ED     Family Communication: Left message for his wife Disposition Plan: To be determined  Consultants:  Nephrology  Gastroenterology  Vascular surgery  Procedures:  Dialysis catheter  Antibiotics:  Rocephin  Time spent: 25 minutes  [place], [place]

## 2016-12-03 NOTE — Consult Note (Signed)
[facility] Nurse wound consult note Reason for Consult: Chronic venous stasis disease.  Nonintact lesions to left anterior lower leg, below knee.  Bruising to knees from fall at home, per patient.  Buttocks and facial bruising as well from fall.  Wound type:Venous insufficiency- wears Unnas boots.  Pressure Injury POA: NA Measurement: LEft leg:  1 cm x 1 cm x 0.2 cm  Wound EBX:IDHW and moist Drainage (amount, consistency, odor) Serous weeping Periwound:Dry skin, chronic skin changes.  Scabbed dry lesions to both lower legs.  Dressing procedure/placement/frequency:Cleanse bilateral lower legs with soap and water and pat gently dry.  Calcium alginate to wound on left leg.  Wrap with zinc layer from below toes to below knee.  Secure with self adherent Coban wrap.  Change twice weekly on Monday and Thursday.  Will not follow at this time.  Please re-consult if needed.  Domenic Moras RN BSN [place] Pager [phone]

## 2016-12-03 NOTE — Progress Notes (Signed)
PT Cancellation Note  Patient Details Name: Vincent Mason MRN: [id number] DOB: 07/05/1935   Cancelled Treatment:    Reason Eval/Treat Not Completed: Medical issues which prohibited therapy   Chart reviewed.  INR noted to be 4.79.  Per therapy protocols of <5, session held today.  Will continue as appropriate.   Chesley Noon 12/03/2016, 10:03 AM

## 2016-12-04 ENCOUNTER — Ambulatory Visit: Payer: Medicare Other | Admitting: Family Medicine

## 2016-12-04 LAB — RENAL FUNCTION PANEL
Albumin: 3.4 g/dL — ABNORMAL LOW (ref 3.5–5.0)
Anion gap: 8 (ref 5–15)
BUN: 46 mg/dL — ABNORMAL HIGH (ref 6–20)
CO2: 31 mmol/L (ref 22–32)
Calcium: 8 mg/dL — ABNORMAL LOW (ref 8.9–10.3)
Chloride: 98 mmol/L — ABNORMAL LOW (ref 101–111)
Creatinine, Ser: 1.93 mg/dL — ABNORMAL HIGH (ref 0.61–1.24)
GFR calc Af Amer: 36 mL/min — ABNORMAL LOW (ref >60)
GFR calc non Af Amer: 31 mL/min — ABNORMAL LOW (ref >60)
Glucose, Bld: 112 mg/dL — ABNORMAL HIGH (ref 65–99)
Phosphorus: 2.8 mg/dL (ref 2.5–4.6)
Potassium: 3.3 mmol/L — ABNORMAL LOW (ref 3.5–5.1)
Sodium: 137 mmol/L (ref 135–145)

## 2016-12-04 LAB — BASIC METABOLIC PANEL
Anion gap: 10 (ref 5–15)
BUN: 66 mg/dL — ABNORMAL HIGH (ref 6–20)
CO2: 28 mmol/L (ref 22–32)
Calcium: 7.8 mg/dL — ABNORMAL LOW (ref 8.9–10.3)
Chloride: 99 mmol/L — ABNORMAL LOW (ref 101–111)
Creatinine, Ser: 2.64 mg/dL — ABNORMAL HIGH (ref 0.61–1.24)
GFR calc Af Amer: 25 mL/min — ABNORMAL LOW (ref >60)
GFR calc non Af Amer: 21 mL/min — ABNORMAL LOW (ref >60)
Glucose, Bld: 120 mg/dL — ABNORMAL HIGH (ref 65–99)
Potassium: 3.1 mmol/L — ABNORMAL LOW (ref 3.5–5.1)
Sodium: 137 mmol/L (ref 135–145)

## 2016-12-04 LAB — DIGOXIN LEVEL: Digoxin Level: 1.2 ng/mL (ref 0.8–2.0)

## 2016-12-04 LAB — PROTIME-INR
INR: 4.79
Prothrombin Time: 46.2 seconds — ABNORMAL HIGH (ref 11.4–15.2)

## 2016-12-04 LAB — MRSA PCR SCREENING: MRSA by PCR: NEGATIVE

## 2016-12-04 LAB — HEMOGLOBIN: Hemoglobin: 7.8 g/dL — ABNORMAL LOW (ref 13.0–18.0)

## 2016-12-04 LAB — CK: Total CK: 448 U/L — ABNORMAL HIGH (ref 49–397)

## 2016-12-04 MED ORDER — CIPROFLOXACIN HCL 500 MG PO TABS
500.0000 mg | ORAL_TABLET | Freq: Every day | ORAL | Status: DC
  Administered 2016-12-05 – 2016-12-06 (×2): 500 mg via ORAL
  Filled 2016-12-04 (×2): qty 1

## 2016-12-04 MED ORDER — IPRATROPIUM-ALBUTEROL 0.5-2.5 (3) MG/3ML IN SOLN: 3.0000 mL | Freq: Four times a day (QID) | RESPIRATORY_TRACT | Status: DC | PRN

## 2016-12-04 MED ORDER — FERROUS SULFATE 325 (65 FE) MG PO TABS
325.0000 mg | ORAL_TABLET | Freq: Two times a day (BID) | ORAL | Status: DC
  Administered 2016-12-04 – 2016-12-07 (×6): 325 mg via ORAL
  Filled 2016-12-04 (×6): qty 1

## 2016-12-04 MED ORDER — IPRATROPIUM-ALBUTEROL 0.5-2.5 (3) MG/3ML IN SOLN: 3.0000 mL | Freq: Four times a day (QID) | RESPIRATORY_TRACT | Status: DC

## 2016-12-04 NOTE — Care Management Important Message (Signed)
Important Message  Patient Details  Name: Vincent Mason MRN: [id number] Date of Birth: February 10, 1936   Medicare Important Message Given:  Yes    Beverly Sessions, RN 12/04/2016, 11:24 AM

## 2016-12-04 NOTE — Progress Notes (Signed)
Patient ID: JEARLD HEMP, male   DOB: 05-May-1936, 81 y.o.   MRN: [id number]  [employer] PROGRESS NOTE  MILON DETHLOFF [id number] DOB: [birth year] DOA: 12/02/2016 PCP: Birdie Sons, MD  HPI/Subjective: Patient asking how he is doing. Less complaints today than yesterday. He stated he had a bowel movement. He is concerned that he is not able to walk around much. Still feeling weak.  Objective: Vitals:   12/04/16 1200 12/04/16 1418  BP: (!) 119/58 (!) 123/51  Pulse: 65 62  Resp:  16  Temp: 97.8 F (36.6 C) 97.8 F (36.6 C)    Filed Weights   12/02/16 1047 12/03/16 1345 12/03/16 1630  Weight: 94.6 kg (208 lb 8 oz) 94.9 kg (209 lb 3.5 oz) 92.9 kg (204 lb 12.9 oz)    ROS: Review of Systems  Constitutional: Negative for chills and fever.  Eyes: Negative for blurred vision.  Respiratory: Positive for cough and shortness of breath.   Cardiovascular: Negative for chest pain.  Gastrointestinal: Negative for abdominal pain, constipation, diarrhea, nausea and vomiting.  Genitourinary: Negative for dysuria.  Musculoskeletal: Positive for joint pain.  Neurological: Positive for dizziness and weakness. Negative for headaches.   Exam: Physical Exam  Constitutional: He is oriented to person, place, and time.  HENT:  Nose: No mucosal edema.  Mouth/Throat: No oropharyngeal exudate or posterior oropharyngeal edema.  Eyes: Pupils are equal, round, and reactive to light. Conjunctivae, EOM and lids are normal.  Neck: No JVD present. Carotid bruit is not present. No edema present. No thyroid mass and no thyromegaly present.  Cardiovascular: S1 normal and S2 normal.  Bradycardia present.  Exam reveals no gallop.   No murmur heard. Pulses:      Dorsalis pedis pulses are 2+ on the right side, and 2+ on the left side.  Respiratory: No respiratory distress. He has no wheezes. He has no rhonchi. He has no rales.  GI: Soft. Bowel sounds are normal. There is no tenderness.   Musculoskeletal:       Right ankle: He exhibits swelling.       Left ankle: He exhibits swelling.  Lymphadenopathy:    He has no cervical adenopathy.  Neurological: He is alert and oriented to person, place, and time. No cranial nerve deficit.  Skin: Skin is warm. Nails show no clubbing.  2 left lower extremity ulcerations with slight drainage. Erythema bilateral lower extremities  Psychiatric: He has a normal mood and affect.      Data Reviewed: Basic Metabolic Panel:  Recent Labs Lab 12/02/16 0737 12/04/16 0454  NA 135 137  K 3.5 3.1*  CL 94* 99*  CO2 24 28  GLUCOSE 131* 120*  BUN 121* 66*  CREATININE 3.98* 2.64*  CALCIUM 7.8* 7.8*   Liver Function Tests:  Recent Labs Lab 12/02/16 0737  AST 177*  ALT 121*  ALKPHOS 83  BILITOT 1.0  PROT 7.2  ALBUMIN 3.8    Recent Labs Lab 12/02/16 0748  AMMONIA 19   CBC:  Recent Labs Lab 12/02/16 0737 12/04/16 0454  WBC 12.9*  --   NEUTROABS 11.2*  --   HGB 8.4* 7.8*  HCT 25.5*  --   MCV 91.7  --   PLT 327  --    Cardiac Enzymes:  Recent Labs Lab 12/02/16 0737 12/04/16 0454  CKTOTAL 2,276* 448*  TROPONINI 0.54*  --    BNP (last 3 results)  Recent Labs  12/02/16 0737  BNP 307.0*     Recent Results (  from the past 240 hour(s))  Aerobic/Anaerobic Culture (surgical/deep wound)     Status: None (Preliminary result)   Collection Time: 12/02/16 12:53 PM  Result Value Ref Range Status   Specimen Description LEG LEFT  Final   Special Requests Normal  Final   Gram Stain   Final    RARE WBC PRESENT,BOTH PMN AND MONONUCLEAR ABUNDANT GRAM NEGATIVE RODS RARE GRAM POSITIVE COCCI IN PAIRS RARE GRAM VARIABLE ROD Performed at [facility], [address] [place], [place] [address]    Culture   Final    ABUNDANT PROVIDENCIA RETTGERI CULTURE REINCUBATED FOR BETTER GROWTH NO ANAEROBES ISOLATED; CULTURE IN PROGRESS FOR 5 DAYS    Report Status PENDING  Incomplete   Organism ID, Bacteria PROVIDENCIA  RETTGERI  Final      Susceptibility   Providencia rettgeri - MIC*    AMPICILLIN RESISTANT Resistant     CEFAZOLIN >=64 RESISTANT Resistant     CEFEPIME <=1 SENSITIVE Sensitive     CEFTAZIDIME <=1 SENSITIVE Sensitive     CEFTRIAXONE <=1 SENSITIVE Sensitive     CIPROFLOXACIN <=0.25 SENSITIVE Sensitive     GENTAMICIN <=1 SENSITIVE Sensitive     IMIPENEM 1 SENSITIVE Sensitive     TRIMETH/SULFA <=20 SENSITIVE Sensitive     AMPICILLIN/SULBACTAM 16 INTERMEDIATE Intermediate     PIP/TAZO <=4 SENSITIVE Sensitive     * ABUNDANT PROVIDENCIA RETTGERI  MRSA PCR Screening     Status: None   Collection Time: 12/04/16 12:30 AM  Result Value Ref Range Status   MRSA by PCR NEGATIVE NEGATIVE Final    Comment:        The GeneXpert MRSA Assay (FDA approved for NASAL specimens only), is one component of a comprehensive MRSA colonization surveillance program. It is not intended to diagnose MRSA infection nor to guide or monitor treatment for MRSA infections.       Scheduled Meds: . [START ON 12/05/2016] ciprofloxacin  500 mg Oral QHS  . diclofenac sodium  2 g Topical QID  . febuxostat  40 mg Oral q morning - 10a  . ferrous sulfate  325 mg Oral BID WC  . pantoprazole (PROTONIX) IV  40 mg Intravenous Q12H  . polyethylene glycol  17 g Oral Daily  . tuberculin  5 Units Intradermal Once   Continuous Infusions: . cefTRIAXone (ROCEPHIN)  IV 1 g (12/03/16 1712)    Assessment/Plan:  1. Acute kidney injury on chronic kidney disease stage III. Continue to hold Demadex and Zaroxolyn. Patient Started on temporary dialysis to help manage fluid. Second dialysis session today. 2. Acute blood loss anemia. Hemoglobin drifted down to 7.8 today. May end up needing a transfusion during this hospital stay. Patient could have bled into his skin with all the bruising that he has. Patient also guaiac positive. Empiric Protonix. Stopped Xaralto. Start oral iron. 3. Anasarca, lower extremity ulcerations. Continue  Rocephin today and switch over to by mouth Cipro tomorrow. Wound care evaluation. Unna boots replaced 4. Acute rhabdomyolysis. CPK 2000 on presentation and came down to 400 today Discontinue Crestor. Dialysis should also help. 5. History of atrial fibrillation. Risk of stroke will be higher off Xaralto. Hold digoxin right now with acute kidney injury. Digoxin level still in therapeutic range 6. Elevated troponin. Likely demand ischemia or false positive with chronic kidney disease. 7. History of obstructive sleep apnea 8. Hyperlipidemia unspecified. Hold Crestor at this time with rhabdomyolysis 9. Weakness. Physical therapy evaluation. They will be limited with catheter in his groin 10.  Moderate to severe pulmonary hypertension seen on echocardiogram last year.  Code Status:     Code Status Orders        Start     Ordered   12/02/16 0938  Full code  Continuous     12/02/16 0937    Code Status History    Date Active Date Inactive Code Status Order ID Comments User Context   09/03/2015  5:58 AM 09/17/2015  4:37 PM Full Code [id number]  Lucious Groves, DO Inpatient   01/17/2015 10:56 AM 01/18/2015  1:52 PM Full Code [id number]  Theodoro Grist, MD ED     Family Communication: Left message for his wife Disposition Plan: To be determined  Consultants:  Nephrology  Gastroenterology  Vascular surgery  Procedures:  Dialysis catheter  Antibiotics:  Rocephin  Time spent: 24 minutes  [place], [place]

## 2016-12-04 NOTE — Consult Note (Signed)
   [facility] CM Inpatient Consult   12/04/2016  ANVAY TENNIS [birth year] [phone]   Patient screened for potential [facility] Management services. Patient is on the [facility] registry as a benefit of his [employer] medicare. Went by patient's room to explain [facility] care Management. Patient was down having dialysis but patient's wife in the room. Gave spouse packet and brief explanation of [facility] care management. Spouse would like for husband to receive [facility] CM services. Spouse states patient is refusing SNF but she is requesting to him that he go to get his strength up before returning home. She is concerned it will be difficult for her to care for him at home. [facility] referral per spouse's request for post hospital discharge follow-up. Patient will receive post hospital discharge calls and be evaluated for monthly home visits. If patient's discharge disposition is SNF [facility] socail worker will visit patient at SNF and assist with discharge planning. Spouse [name] gave home number [phone] and her cell [phone] as the best contact numbers. [facility] Care Management services does not interfere with or replace any services arranged by the inpatient care management team. RNCM left contact information and [facility] literature at the bedside. Made inpatient RNCM aware that [facility] will be following for care management. For additional questions please contact:   Renso Swett RN, [facility] Liaison  [phone]) [employer] [phone]) Toll free office

## 2016-12-04 NOTE — Progress Notes (Signed)
Went to administer IV rocephin and IV leaked. IV team consult placed. Will notify night shift RN to give after iV is placed.

## 2016-12-04 NOTE — Progress Notes (Signed)
PT Cancellation Note  Patient Details Name: Vincent Mason MRN: [id number] DOB: Jul 17, 1935   Cancelled Treatment:    Reason Eval/Treat Not Completed: Patient at procedure or test/unavailable.  Pt currently not in room (nursing reports pt currently at dialysis).  New order for mobility with temporary fem cath (see order for specific instructions).  Will re-attempt PT treatment at a later date/time.  Leitha Bleak, PT 12/04/16, 3:15 PM [phone]

## 2016-12-04 NOTE — Progress Notes (Addendum)
Per Dr. Leslye Peer place order for Renal diet, per MD pt does not need fluid restriction at this time. As it appears that impaired kidney function is acute. Also per MD place order for duonebs 24ml q 6 hours PRN for wheezing. Per MD okay for pt to get up and take a few steps and get up to the chair even with temporary dialysis catheter. Per pharmacy verified that pt says he does not have any allergies even though it was flagged when placing the order.

## 2016-12-04 NOTE — Progress Notes (Signed)
HD treatment started 

## 2016-12-04 NOTE — Care Management (Signed)
Patient admitted for AKI.  Patient live at home with Wife.  PCP [place].  Patient currently receiving acute HD and has temp HD cath in place.  PT has assessed patient and currently recommending SNF.  Patient states that he has been to SNF before and wishes to return home with home health services.  Patient provided with home health agency preference.  Patient states that he does not have a preference.  Heads up referral made to [facility] with [place]. Patient would benefit from RN, PT, aide and SW.  Patient has bilateral una boots in place.  RNCM following

## 2016-12-04 NOTE — Progress Notes (Signed)
[facility], [place] 12/04/16  Subjective:   Patient known to our practice from December 2015. He presents via EMS for increasing confusion.  He also has massive edema of his legs, abdomen and testicles.   Room air today 1500 cc removed with HD 1st today, 2000 cc removed yesterday States he feels that his swelling is decreasing Denies any nausea vomiting   Objective:  Vital signs in last 24 hours:  Temp:  [97.7 F (36.5 C)-97.9 F (36.6 C)] 97.8 F (36.6 C) (07/13 1200) Pulse Rate:  [51-103] 65 (07/13 1200) Resp:  [14-25] 18 (07/13 0513) BP: (103-119)/(47-62) 119/58 (07/13 1200) SpO2:  [96 %-100 %] 98 % (07/13 1200) Weight:  [92.9 kg (204 lb 12.9 oz)] 92.9 kg (204 lb 12.9 oz) (07/12 1630)  Weight change: -13.056 kg (-28 lb 12.5 oz) Filed Weights   12/02/16 1047 12/03/16 1345 12/03/16 1630  Weight: 94.6 kg (208 lb 8 oz) 94.9 kg (209 lb 3.5 oz) 92.9 kg (204 lb 12.9 oz)    Intake/Output:    Intake/Output Summary (Last 24 hours) at 12/04/16 1419 Last data filed at 12/04/16 1408  Gross per 24 hour  Intake              170 ml  Output             2525 ml  Net            -2355 ml     Physical Exam: General: No acute distress, laying in the bed  HEENT Anicteric, moist oral mucous membranes  Neck supple  Pulm/lungs Decreased breath sounds at bases  CVS/Heart Irregular, soft systolic murmur  Abdomen:  Soft, mildly distended  Extremities: Massive edema bilaterally up to the thighs and lower abdomen  Neurologic: Alert, able to follow commands  Skin: No acute rashes  Access: Rt Femoral dialysis cathter       Basic Metabolic Panel:   Recent Labs Lab 12/02/16 0737 12/04/16 0454  NA 135 137  K 3.5 3.1*  CL 94* 99*  CO2 24 28  GLUCOSE 131* 120*  BUN 121* 66*  CREATININE 3.98* 2.64*  CALCIUM 7.8* 7.8*     CBC:  Recent Labs Lab 12/02/16 0737 12/04/16 0454  WBC 12.9*  --   NEUTROABS 11.2*  --   HGB 8.4* 7.8*  HCT 25.5*  --    MCV 91.7  --   PLT 327  --       Lab Results  Component Value Date   HEPBSAG Negative 12/02/2016   HEPBSAB Reactive 09/11/2015   HEPBIGM Negative 10/10/2015      Microbiology:  Recent Results (from the past 240 hour(s))  Aerobic/Anaerobic Culture (surgical/deep wound)     Status: None (Preliminary result)   Collection Time: 12/02/16 12:53 PM  Result Value Ref Range Status   Specimen Description LEG LEFT  Final   Special Requests Normal  Final   Gram Stain   Final    RARE WBC PRESENT,BOTH PMN AND MONONUCLEAR ABUNDANT GRAM NEGATIVE RODS RARE GRAM POSITIVE COCCI IN PAIRS RARE GRAM VARIABLE ROD Performed at [facility], [address], [place], [place] [address]    Culture   Final    ABUNDANT PROVIDENCIA RETTGERI CULTURE REINCUBATED FOR BETTER GROWTH NO ANAEROBES ISOLATED; CULTURE IN PROGRESS FOR 5 DAYS    Report Status PENDING  Incomplete   Organism ID, Bacteria PROVIDENCIA RETTGERI  Final      Susceptibility   Providencia rettgeri - MIC*  AMPICILLIN RESISTANT Resistant     CEFAZOLIN >=64 RESISTANT Resistant     CEFEPIME <=1 SENSITIVE Sensitive     CEFTAZIDIME <=1 SENSITIVE Sensitive     CEFTRIAXONE <=1 SENSITIVE Sensitive     CIPROFLOXACIN <=0.25 SENSITIVE Sensitive     GENTAMICIN <=1 SENSITIVE Sensitive     IMIPENEM 1 SENSITIVE Sensitive     TRIMETH/SULFA <=20 SENSITIVE Sensitive     AMPICILLIN/SULBACTAM 16 INTERMEDIATE Intermediate     PIP/TAZO <=4 SENSITIVE Sensitive     * ABUNDANT PROVIDENCIA RETTGERI  MRSA PCR Screening     Status: None   Collection Time: 12/04/16 12:30 AM  Result Value Ref Range Status   MRSA by PCR NEGATIVE NEGATIVE Final    Comment:        The GeneXpert MRSA Assay (FDA approved for NASAL specimens only), is one component of a comprehensive MRSA colonization surveillance program. It is not intended to diagnose MRSA infection nor to guide or monitor treatment for MRSA infections.     Coagulation Studies:  Recent  Labs  12/02/16 0851  LABPROT 46.2*  INR 4.79*    Urinalysis:  Recent Labs  12/02/16 0737  COLORURINE YELLOW*  LABSPEC 1.012  PHURINE 6.0  GLUCOSEU NEGATIVE  HGBUR MODERATE*  BILIRUBINUR NEGATIVE  KETONESUR NEGATIVE  PROTEINUR 30*  NITRITE NEGATIVE  LEUKOCYTESUR NEGATIVE      Imaging: No results found.   Medications:   . cefTRIAXone (ROCEPHIN)  IV 1 g (12/03/16 1712)   . [START ON 12/05/2016] ciprofloxacin  500 mg Oral QHS  . diclofenac sodium  2 g Topical QID  . febuxostat  40 mg Oral q morning - 10a  . ferrous sulfate  325 mg Oral BID WC  . pantoprazole (PROTONIX) IV  40 mg Intravenous Q12H  . polyethylene glycol  17 g Oral Daily  . tuberculin  5 Units Intradermal Once   acetaminophen **OR** acetaminophen, ipratropium-albuterol, oxyCODONE, pramipexole  Assessment/ Plan:  81 y.o.caucasian male With atrial fibrillation, pulmonary hypertension, severe tricuspid regurgitation, obstructive sleep apnea, hypertension, COPD, chronic kidney disease  1.  Acute renal failure, likely cardiorenal syndrome 2.  Chronic kidney disease stage 3, baseline creatinine 1.85/GFR 34 in April 2017 3.  Severe anasarca 4.  Severe tricuspid regurgitation, dilated right atrium 5.  Pulmonary hypertension 6.  Coagulopathy with INR 4.8 7.  Hypokalemia  Patient appears to have developed anasarca due to cardiorenal syndrome. He has experienced diuretic failure.  Due to pulmonary hypertension, low normal blood pressure, intravenous diuretics might not be effective.    Plan: Low salt diet Dialysis again today for volume removal Next HD tomorrow for volume removal Depending on UOP and response to diuretics, we might determine early next week whether he will need long term dialysis      LOS: 2 [facility] 7/13/20182:19 PM  Home, [place]

## 2016-12-04 NOTE — Progress Notes (Signed)
HD treatment complete 

## 2016-12-05 ENCOUNTER — Inpatient Hospital Stay
Admit: 2016-12-05 | Discharge: 2016-12-05 | Disposition: A | Discharge status: Home / Self Care | Payer: Medicare Other | Attending: Internal Medicine | Admitting: Internal Medicine

## 2016-12-05 LAB — HEMOGLOBIN: Hemoglobin: 7.9 g/dL — ABNORMAL LOW (ref 13.0–18.0)

## 2016-12-05 MED ORDER — IPRATROPIUM-ALBUTEROL 0.5-2.5 (3) MG/3ML IN SOLN
3.0000 mL | Freq: Two times a day (BID) | RESPIRATORY_TRACT | Status: DC
  Administered 2016-12-06 (×2): 3 mL via RESPIRATORY_TRACT
  Filled 2016-12-05 (×2): qty 3

## 2016-12-05 MED ORDER — IPRATROPIUM-ALBUTEROL 0.5-2.5 (3) MG/3ML IN SOLN
3.0000 mL | Freq: Four times a day (QID) | RESPIRATORY_TRACT | Status: DC
  Administered 2016-12-05 (×2): 3 mL via RESPIRATORY_TRACT
  Filled 2016-12-05: qty 6
  Filled 2016-12-05: qty 3

## 2016-12-05 NOTE — Progress Notes (Signed)
[place], [place] 12/05/16  Subjective:   Patient known to our practice from December 2015. He presents via EMS for increasing confusion.  He also has massive edema of his legs, abdomen and testicles.   Room air today 7500 cc removed with HD so far States he feels that his swelling is decreasing Denies any nausea vomiting   Objective:  Vital signs in last 24 hours:  Temp:  [97.7 F (36.5 C)-98.6 F (37 C)] 98.1 F (36.7 C) (07/14 1235) Pulse Rate:  [62-84] 84 (07/14 1242) Resp:  [15-24] 22 (07/14 1242) BP: (97-123)/(45-61) 104/54 (07/14 1235) SpO2:  [91 %-100 %] 91 % (07/14 1200)  Weight change:  Filed Weights   12/02/16 1047 12/03/16 1345 12/03/16 1630  Weight: 94.6 kg (208 lb 8 oz) 94.9 kg (209 lb 3.5 oz) 92.9 kg (204 lb 12.9 oz)    Intake/Output:    Intake/Output Summary (Last 24 hours) at 12/05/16 1255 Last data filed at 12/05/16 1235  Gross per 24 hour  Intake              240 ml  Output             4625 ml  Net            -4385 ml     Physical Exam: General: No acute distress, laying in the bed  HEENT Anicteric, moist oral mucous membranes  Neck supple  Pulm/lungs Decreased breath sounds at bases  CVS/Heart Irregular, soft systolic murmur  Abdomen:  Soft, mildly distended  Extremities: Massive edema bilaterally up to the thighs and lower abdomen  Neurologic: Alert, able to follow commands  Skin: No acute rashes  Access: Rt Femoral dialysis cathter       Basic Metabolic Panel:   Recent Labs Lab 12/02/16 0737 12/04/16 0454 12/04/16 1550  NA 135 137 137  K 3.5 3.1* 3.3*  CL 94* 99* 98*  CO2 24 28 31   GLUCOSE 131* 120* 112*  BUN 121* 66* 46*  CREATININE 3.98* 2.64* 1.93*  CALCIUM 7.8* 7.8* 8.0*  PHOS  --   --  2.8     CBC:  Recent Labs Lab 12/02/16 0737 12/04/16 0454 12/05/16 0742  WBC 12.9*  --   --   NEUTROABS 11.2*  --   --   HGB 8.4* 7.8* 7.9*  HCT 25.5*  --   --   MCV 91.7  --   --   PLT 327   --   --       Lab Results  Component Value Date   HEPBSAG Negative 12/02/2016   HEPBSAB Reactive 09/11/2015   HEPBIGM Negative 10/10/2015      Microbiology:  Recent Results (from the past 240 hour(s))  Aerobic/Anaerobic Culture (surgical/deep wound)     Status: None (Preliminary result)   Collection Time: 12/02/16 12:53 PM  Result Value Ref Range Status   Specimen Description LEG LEFT  Final   Special Requests Normal  Final   Gram Stain   Final    RARE WBC PRESENT,BOTH PMN AND MONONUCLEAR ABUNDANT GRAM NEGATIVE RODS RARE GRAM POSITIVE COCCI IN PAIRS RARE GRAM VARIABLE ROD Performed at [facility], [address] [place], [place] [address]    Culture   Final    ABUNDANT PROVIDENCIA RETTGERI CULTURE REINCUBATED FOR BETTER GROWTH NO ANAEROBES ISOLATED; CULTURE IN PROGRESS FOR 5 DAYS    Report Status PENDING  Incomplete   Organism ID, Bacteria PROVIDENCIA RETTGERI  Final  Susceptibility   Providencia rettgeri - MIC*    AMPICILLIN RESISTANT Resistant     CEFAZOLIN >=64 RESISTANT Resistant     CEFEPIME <=1 SENSITIVE Sensitive     CEFTAZIDIME <=1 SENSITIVE Sensitive     CEFTRIAXONE <=1 SENSITIVE Sensitive     CIPROFLOXACIN <=0.25 SENSITIVE Sensitive     GENTAMICIN <=1 SENSITIVE Sensitive     IMIPENEM 1 SENSITIVE Sensitive     TRIMETH/SULFA <=20 SENSITIVE Sensitive     AMPICILLIN/SULBACTAM 16 INTERMEDIATE Intermediate     PIP/TAZO <=4 SENSITIVE Sensitive     * ABUNDANT PROVIDENCIA RETTGERI  MRSA PCR Screening     Status: None   Collection Time: 12/04/16 12:30 AM  Result Value Ref Range Status   MRSA by PCR NEGATIVE NEGATIVE Final    Comment:        The GeneXpert MRSA Assay (FDA approved for NASAL specimens only), is one component of a comprehensive MRSA colonization surveillance program. It is not intended to diagnose MRSA infection nor to guide or monitor treatment for MRSA infections.     Coagulation Studies: No results for input(s): LABPROT,  INR in the last 72 hours.  Urinalysis: No results for input(s): COLORURINE, LABSPEC, PHURINE, GLUCOSEU, HGBUR, BILIRUBINUR, KETONESUR, PROTEINUR, UROBILINOGEN, NITRITE, LEUKOCYTESUR in the last 72 hours.  Invalid input(s): APPERANCEUR    Imaging: No results found.   Medications:    . ciprofloxacin  500 mg Oral QHS  . diclofenac sodium  2 g Topical QID  . febuxostat  40 mg Oral q morning - 10a  . ferrous sulfate  325 mg Oral BID WC  . ipratropium-albuterol  3 mL Nebulization Q6H  . pantoprazole (PROTONIX) IV  40 mg Intravenous Q12H  . polyethylene glycol  17 g Oral Daily  . tuberculin  5 Units Intradermal Once   acetaminophen **OR** acetaminophen, oxyCODONE, pramipexole  Assessment/ Plan:  81 y.o.caucasian male With atrial fibrillation, pulmonary hypertension, severe tricuspid regurgitation, obstructive sleep apnea, hypertension, COPD, chronic kidney disease  1.  Acute renal failure, likely cardiorenal syndrome 2.  Chronic kidney disease stage 3, baseline creatinine 1.85/GFR 34 in April 2017 3.  Severe anasarca 4.  Severe tricuspid regurgitation, dilated right atrium 5.  Pulmonary hypertension 6.  Coagulopathy  7.  Hypokalemia  Patient appears to have developed anasarca due to cardiorenal syndrome. He has experienced diuretic failure.  Due to pulmonary hypertension, low normal blood pressure, intravenous diuretics might not be effective.    Plan: Low salt diet Dialysis again today for volume removal Remove dialysis cathter today.  7500 cc of fluid removes with HD so far Monitor labs and volume status If patient needs more dialysis will get permcath early next week      LOS: 3 [facility] 7/14/201812:55 PM  [place], [place]

## 2016-12-05 NOTE — Progress Notes (Signed)
PT Cancellation Note  Patient Details Name: Vincent Mason MRN: [id number] DOB: [birth year]   Cancelled Treatment:    Reason Eval/Treat Not Completed: Patient at procedure or test/unavailable (Patient currently off unit for dialysis; will re-attempt at later time/date as patient medically appropriate and available for participation.)   [name] H. Owens Shark, PT, DPT, NCS 12/05/16, 10:24 AM [phone]

## 2016-12-05 NOTE — Progress Notes (Signed)
HD STARTED  

## 2016-12-05 NOTE — Progress Notes (Signed)
PRE DIALYSIS ASSESSMENT 

## 2016-12-05 NOTE — Progress Notes (Signed)
Gastroenterology Progress Note    [name] 81 y.o. 03-Dec-1935   Subjective: Feels good. Denies abdominal pain. On hemodialysis this morning.  Objective: Vital signs in last 24 hours: Vitals:   12/05/16 1100 12/05/16 1130  BP: (!) 121/49 111/61  Pulse: 79 75  Resp: (!) 23 16  Temp:    T 98.1  Physical Exam: Gen: alert, no acute distress CV: RRR Chest: CTA B Abd: soft, nontender, nondistended, +BS  Lab Results:  Recent Labs  12/04/16 0454 12/04/16 1550  NA 137 137  K 3.1* 3.3*  CL 99* 98*  CO2 28 31  GLUCOSE 120* 112*  BUN 66* 46*  CREATININE 2.64* 1.93*  CALCIUM 7.8* 8.0*  PHOS  --  2.8    Recent Labs  12/04/16 1550  ALBUMIN 3.4*    Recent Labs  12/04/16 0454 12/05/16 0742  HGB 7.8* 7.9*   No results for input(s): LABPROT, INR in the last 72 hours.    Assessment/Plan: Anemia and heme positive stool without any overt bleeding. Hgb 7.9 (7.8 yesterday; 8.4 on admit). Would recommend outpt f/u of anemia with Dr. Allen Norris or colleagues to decide on timing of a colon/EGD. Will sign off. Call if questions.   [place] C. 12/05/2016, 11:52 AMPatient ID: Vincent Mason, male   DOB: [birth year], 81 y.o.   MRN: [id number]

## 2016-12-05 NOTE — Progress Notes (Signed)
*  PRELIMINARY RESULTS* Echocardiogram 2D Echocardiogram has been performed.  Vincent Mason 12/05/2016, 9:27 AM

## 2016-12-05 NOTE — Progress Notes (Signed)
POST DIALYSIS ASSESSMENT 

## 2016-12-05 NOTE — Progress Notes (Signed)
Patient ID: Vincent Mason, male   DOB: Nov 11, 1935, 81 y.o.   MRN: [id number]   [employer] PROGRESS NOTE  Vincent Mason XLK:[id number] DOB: 04-May-1936 DOA: 12/02/2016 PCP: Birdie Sons, MD  HPI/Subjective: Patient feeling a little bit better. States the swelling in his legs and scrotum is better. Still having all cough and a little wheeze.  Objective: Vitals:   12/05/16 1242 12/05/16 1302  BP:  (!) 110/48  Pulse: 84 79  Resp: (!) 22 16  Temp:  98.2 F (36.8 C)    Filed Weights   12/02/16 1047 12/03/16 1345 12/03/16 1630  Weight: 94.6 kg (208 lb 8 oz) 94.9 kg (209 lb 3.5 oz) 92.9 kg (204 lb 12.9 oz)    ROS: Review of Systems  Constitutional: Negative for chills and fever.  Eyes: Negative for blurred vision.  Respiratory: Positive for cough, shortness of breath and wheezing.   Cardiovascular: Negative for chest pain.  Gastrointestinal: Negative for abdominal pain, constipation, diarrhea, nausea and vomiting.  Genitourinary: Negative for dysuria.  Musculoskeletal: Positive for joint pain.  Neurological: Positive for dizziness and weakness. Negative for headaches.   Exam: Physical Exam  Constitutional: He is oriented to person, place, and time.  HENT:  Nose: No mucosal edema.  Mouth/Throat: No oropharyngeal exudate or posterior oropharyngeal edema.  Eyes: Pupils are equal, round, and reactive to light. Conjunctivae, EOM and lids are normal.  Neck: No JVD present. Carotid bruit is not present. No edema present. No thyroid mass and no thyromegaly present.  Cardiovascular: S1 normal and S2 normal.  Exam reveals no gallop.   No murmur heard. Pulses:      Dorsalis pedis pulses are 2+ on the right side, and 2+ on the left side.  Respiratory: No respiratory distress. He has no wheezes. He has no rhonchi. He has no rales.  GI: Soft. Bowel sounds are normal. There is no tenderness.  Musculoskeletal:       Right ankle: He exhibits swelling.       Left ankle: He exhibits  swelling.  Lymphadenopathy:    He has no cervical adenopathy.  Neurological: He is alert and oriented to person, place, and time. No cranial nerve deficit.  Skin: Skin is warm. Nails show no clubbing.  Legs covered with Unna boots  Psychiatric: He has a normal mood and affect.      Data Reviewed: Basic Metabolic Panel:  Recent Labs Lab 12/02/16 0737 12/04/16 0454 12/04/16 1550  NA 135 137 137  K 3.5 3.1* 3.3*  CL 94* 99* 98*  CO2 24 28 31   GLUCOSE 131* 120* 112*  BUN 121* 66* 46*  CREATININE 3.98* 2.64* 1.93*  CALCIUM 7.8* 7.8* 8.0*  PHOS  --   --  2.8   Liver Function Tests:  Recent Labs Lab 12/02/16 0737 12/04/16 1550  AST 177*  --   ALT 121*  --   ALKPHOS 83  --   BILITOT 1.0  --   PROT 7.2  --   ALBUMIN 3.8 3.4*    Recent Labs Lab 12/02/16 0748  AMMONIA 19   CBC:  Recent Labs Lab 12/02/16 0737 12/04/16 0454 12/05/16 0742  WBC 12.9*  --   --   NEUTROABS 11.2*  --   --   HGB 8.4* 7.8* 7.9*  HCT 25.5*  --   --   MCV 91.7  --   --   PLT 327  --   --    Cardiac Enzymes:  Recent Labs Lab  12/02/16 0737 12/04/16 0454  CKTOTAL 2,276* 448*  TROPONINI 0.54*  --    BNP (last 3 results)  Recent Labs  12/02/16 0737  BNP 307.0*     Recent Results (from the past 240 hour(s))  Aerobic/Anaerobic Culture (surgical/deep wound)     Status: None (Preliminary result)   Collection Time: 12/02/16 12:53 PM  Result Value Ref Range Status   Specimen Description LEG LEFT  Final   Special Requests Normal  Final   Gram Stain   Final    RARE WBC PRESENT,BOTH PMN AND MONONUCLEAR ABUNDANT GRAM NEGATIVE RODS RARE GRAM POSITIVE COCCI IN PAIRS RARE GRAM VARIABLE ROD Performed at [facility], [place] [address], [place], [place] [address]    Culture   Final    ABUNDANT PROVIDENCIA RETTGERI CULTURE REINCUBATED FOR BETTER GROWTH NO ANAEROBES ISOLATED; CULTURE IN PROGRESS FOR 5 DAYS    Report Status PENDING  Incomplete   Organism ID, Bacteria  PROVIDENCIA RETTGERI  Final      Susceptibility   Providencia rettgeri - MIC*    AMPICILLIN RESISTANT Resistant     CEFAZOLIN >=64 RESISTANT Resistant     CEFEPIME <=1 SENSITIVE Sensitive     CEFTAZIDIME <=1 SENSITIVE Sensitive     CEFTRIAXONE <=1 SENSITIVE Sensitive     CIPROFLOXACIN <=0.25 SENSITIVE Sensitive     GENTAMICIN <=1 SENSITIVE Sensitive     IMIPENEM 1 SENSITIVE Sensitive     TRIMETH/SULFA <=20 SENSITIVE Sensitive     AMPICILLIN/SULBACTAM 16 INTERMEDIATE Intermediate     PIP/TAZO <=4 SENSITIVE Sensitive     * ABUNDANT PROVIDENCIA RETTGERI  MRSA PCR Screening     Status: None   Collection Time: 12/04/16 12:30 AM  Result Value Ref Range Status   MRSA by PCR NEGATIVE NEGATIVE Final    Comment:        The GeneXpert MRSA Assay (FDA approved for NASAL specimens only), is one component of a comprehensive MRSA colonization surveillance program. It is not intended to diagnose MRSA infection nor to guide or monitor treatment for MRSA infections.       Scheduled Meds: . ciprofloxacin  500 mg Oral QHS  . diclofenac sodium  2 g Topical QID  . febuxostat  40 mg Oral q morning - 10a  . ferrous sulfate  325 mg Oral BID WC  . ipratropium-albuterol  3 mL Nebulization Q6H  . pantoprazole (PROTONIX) IV  40 mg Intravenous Q12H  . polyethylene glycol  17 g Oral Daily  . tuberculin  5 Units Intradermal Once    Assessment/Plan:  1. Acute kidney injury on chronic kidney disease stage III. Continue to hold Demadex and Zaroxolyn. Patient's anasarca has been resistant to oral diuretics. Dialysis started to help manage fluid. 2. Acute blood loss anemia. Hemoglobin 7.9 today. May end up needing a transfusion during this hospital stay. Patient could have bled into his skin with all the bruising that he has. Patient also guaiac positive. Empiric Protonix. Stopped Xaralto. Started oral iron. 3. Anasarca, lower extremity ulcerations. Continue by mouth Cipro today. Wound care evaluation  appreciated and Unna boots replaced. 4. Acute rhabdomyolysis. CPK 2000 on presentation. Discontinue Crestor. Dialysis should also help. 5. History of atrial fibrillation. Risk of stroke will be higher off Xaralto. Hold digoxin right now with acute kidney injury. Digoxin level still in therapeutic range. As digoxin comes out of the system heart rate may go higher and low-dose Coreg may be needed. 6. Elevated troponin. Likely demand ischemia or false positive with chronic  kidney disease. 7. History of obstructive sleep apnea 8. Hyperlipidemia unspecified. Hold Crestor at this time with rhabdomyolysis 9. Weakness. Physical therapy evaluation. They will be limited with catheter in his groin 10. Moderate to severe pulmonary hypertension seen on echocardiogram last year. Repeat echo ordered.  Code Status:     Code Status Orders        Start     Ordered   12/02/16 0938  Full code  Continuous     12/02/16 0937    Code Status History    Date Active Date Inactive Code Status Order ID Comments User Context   09/03/2015  5:58 AM 09/17/2015  4:37 PM Full Code [id number]  Lucious Groves, DO Inpatient   01/17/2015 10:56 AM 01/18/2015  1:52 PM Full Code [id number]  Theodoro Grist, MD ED     Family Communication: Spoke with wifeOn the phone today Disposition Plan: To be determined  Consultants:  Nephrology  Gastroenterology  Vascular surgery  Procedures:  Dialysis catheter  Antibiotics:  Oral Cipro  Time spent: 26 minutes  [place], [place]

## 2016-12-05 NOTE — Progress Notes (Signed)
HD COMPLETED  

## 2016-12-06 LAB — BASIC METABOLIC PANEL
Anion gap: 7 (ref 5–15)
BUN: 33 mg/dL — ABNORMAL HIGH (ref 6–20)
CO2: 30 mmol/L (ref 22–32)
Calcium: 7.8 mg/dL — ABNORMAL LOW (ref 8.9–10.3)
Chloride: 101 mmol/L (ref 101–111)
Creatinine, Ser: 2.08 mg/dL — ABNORMAL HIGH (ref 0.61–1.24)
GFR calc Af Amer: 33 mL/min — ABNORMAL LOW (ref >60)
GFR calc non Af Amer: 29 mL/min — ABNORMAL LOW (ref >60)
Glucose, Bld: 110 mg/dL — ABNORMAL HIGH (ref 65–99)
Potassium: 3.4 mmol/L — ABNORMAL LOW (ref 3.5–5.1)
Sodium: 138 mmol/L (ref 135–145)

## 2016-12-06 LAB — ECHOCARDIOGRAM COMPLETE
Height: 70 in
Weight: 3276.92 oz

## 2016-12-06 LAB — HEMOGLOBIN: Hemoglobin: 7.5 g/dL — ABNORMAL LOW (ref 13.0–18.0)

## 2016-12-06 LAB — FERRITIN: Ferritin: 100 ng/mL (ref 24–336)

## 2016-12-06 MED ORDER — TORSEMIDE 20 MG PO TABS
40.0000 mg | ORAL_TABLET | Freq: Two times a day (BID) | ORAL | Status: DC
  Administered 2016-12-06 – 2016-12-07 (×2): 40 mg via ORAL
  Filled 2016-12-06 (×3): qty 2

## 2016-12-06 MED ORDER — FUROSEMIDE 40 MG PO TABS: 40.0000 mg | ORAL_TABLET | Freq: Two times a day (BID) | ORAL | Status: DC

## 2016-12-06 MED ORDER — IPRATROPIUM-ALBUTEROL 0.5-2.5 (3) MG/3ML IN SOLN: 3.0000 mL | RESPIRATORY_TRACT | Status: DC | PRN

## 2016-12-06 NOTE — Progress Notes (Signed)
PT Cancellation Note  Patient Details Name: SCHNEUR CROWSON MRN: [id number] DOB: 10-17-1935   Cancelled Treatment:    Reason Eval/Treat Not Completed: Other (comment).  Mult refusals despite sharing that MD has specifically ordered PT to see him.  WIll try later as pt allows.   Ramond Dial 12/06/2016, 9:46 AM   Mee Hives, PT MS Acute Rehab Dept. Number: [place]  and [place]

## 2016-12-06 NOTE — Progress Notes (Signed)
Physical Therapy Treatment Patient Details Name: Vincent Mason MRN: [id number] DOB: [birth year] Today's Date: 12/06/2016    History of Present Illness 81 y/o male admitted with confusion s/p passing out and falling. Pt found to have blood in stool, anemia, arm pain, elevated troponin (.54 - likely due to demand ischemia/kidney injury), and an acute kidney injury. BUN (121) and creatinine (3.98) were elevated upon admission. PMH includes a-fib with flutter, c-diff, COPD, HCAP, HLD, HTN, CKD III, OSA, R hearing loss, and sepsis with shock.    PT Comments    Significant improvement on cognition, able to follow instructions for his therapy and walked at a supervised level with RW with some minor cues and corrections.  Have HHPT follow up now, much more able to manage with his mobility and should be able to have wife care for him at home.  Strength in LE's is surprisingly good, will not anticipate a problem being able to navigate on his personal walker at home.  Due to recent cognitive changes and bedrest do recommend HHPT to follow as needed for safety and endurance.   Follow Up Recommendations  Home health PT     Equipment Recommendations  None recommended by PT    Recommendations for Other Services       Precautions / Restrictions Precautions Precautions: Fall Restrictions Weight Bearing Restrictions: No    Mobility  Bed Mobility Overal bed mobility: Modified Independent                Transfers Overall transfer level: Modified independent Equipment used: Rolling walker (2 wheeled) Transfers: Sit to/from Stand Sit to Stand: Supervision            Ambulation/Gait Ambulation/Gait assistance: Supervision Ambulation Distance (Feet): 100 Feet Assistive device: Rolling walker (2 wheeled) Gait Pattern/deviations: Step-through pattern;Decreased stride length;Wide base of support;Trunk flexed Gait velocity: minimally slowed Gait velocity interpretation: Below normal speed  for age/gender General Gait Details: Pt is able to maneuver walker well, has minor safety concerns as he has not been up moving much this admission   Stairs            Wheelchair Mobility    Modified Rankin (Stroke Patients Only)       Balance Overall balance assessment: History of Falls                                          Cognition Arousal/Alertness: Awake/alert Behavior During Therapy: Impulsive Overall Cognitive Status: Impaired/Different from baseline Area of Impairment: Following commands;Safety/judgement;Awareness;Problem solving                       Following Commands: Follows one step commands with increased time Safety/Judgement: Decreased awareness of safety Awareness: Intellectual Problem Solving: Requires verbal cues General Comments: somewhat impulsive but listens to PT       Exercises Other Exercises Other Exercises: LE strength very good, WFL BLE's    General Comments General comments (skin integrity, edema, etc.): Pt is clearer today, cath removed last PM per pt.  He is feeling better, noted signifcant reduction in edema LE"s stating his legs were large to begin with       Pertinent Vitals/Pain Pain Assessment: No/denies pain    Home Living                      Prior Function  PT Goals (current goals can now be found in the care plan section) Acute Rehab PT Goals Patient Stated Goal: to get better soon Progress towards PT goals: Progressing toward goals (able to walk in a supervised way)    Frequency    Min 2X/week      PT Plan Discharge plan needs to be updated    Co-evaluation              AM-PAC PT "6 Clicks" Daily Activity  Outcome Measure  Difficulty turning over in bed (including adjusting bedclothes, sheets and blankets)?: None Difficulty moving from lying on back to sitting on the side of the bed? : None Difficulty sitting down on and standing up from a chair  with arms (e.g., wheelchair, bedside commode, etc,.)?: A Little Help needed moving to and from a bed to chair (including a wheelchair)?: A Little Help needed walking in hospital room?: A Little Help needed climbing 3-5 steps with a railing? : A Little 6 Click Score: 20    End of Session Equipment Utilized During Treatment: Gait belt Activity Tolerance: Patient limited by fatigue Patient left: with call bell/phone within reach;in chair Nurse Communication: Mobility status;Other (comment) (Pt has demonstrated enough mobility to not need follow up ) PT Visit Diagnosis: Other abnormalities of gait and mobility (R26.89);Muscle weakness (generalized) (M62.81);History of falling (Z91.81);Pain     Time: 1120-1149 PT Time Calculation (min) (ACUTE ONLY): 29 min  Charges:  $Gait Training: 8-22 mins $Therapeutic Exercise: 8-22 mins                    G Codes:  Functional Assessment Tool Used: AM-PAC 6 Clicks Basic Mobility     Ramond Dial 12/06/2016, 1:08 PM   Mee Hives, PT MS Acute Rehab Dept. Number: [place] [facility] [place]

## 2016-12-06 NOTE — Progress Notes (Signed)
Pt noted that he walked today with PT and re conversation with MD if he walked good he will be able to ambulate independently in the room. From my assessment and reading PT noted there is still safety concerns with Pt mobility and he is still impulsive at times. Pt has had several falls at home and has walked with Pt just this one time today. Per my assessment PT is still a  falls risk and needs assistance/ supervision and instructions when ambulating. Pt will be allowed to do as much as he possibly can but with staff supervision.

## 2016-12-06 NOTE — Progress Notes (Signed)
Patient ID: Vincent Mason, male   DOB: Jul 19, 1935, 81 y.o.   MRN: [id number]    [facility] PROGRESS NOTE  Vincent Mason TFT:[id number] DOB: September 17, 1935 DOA: 12/02/2016 PCP: Birdie Sons, MD  HPI/Subjective: Patient feeling better today. States his swelling is better. Still having a little bit of wheeze. Still feeling weak. Patient hopefully that he can not have to be on dialysis.  Objective: Vitals:   12/06/16 0400 12/06/16 1246  BP: (!) 103/44 (!) 110/59  Pulse: 77 78  Resp: 18 19  Temp: 98 F (36.7 C) 98.1 F (36.7 C)    Filed Weights   12/02/16 1047 12/03/16 1345 12/03/16 1630  Weight: 94.6 kg (208 lb 8 oz) 94.9 kg (209 lb 3.5 oz) 92.9 kg (204 lb 12.9 oz)    ROS: Review of Systems  Constitutional: Negative for chills and fever.  Eyes: Negative for blurred vision.  Respiratory: Positive for cough, shortness of breath and wheezing.   Cardiovascular: Negative for chest pain.  Gastrointestinal: Negative for abdominal pain, constipation, diarrhea, nausea and vomiting.  Genitourinary: Negative for dysuria.  Musculoskeletal: Positive for joint pain.  Neurological: Positive for weakness. Negative for dizziness and headaches.   Exam: Physical Exam  Constitutional: He is oriented to person, place, and time.  HENT:  Nose: No mucosal edema.  Mouth/Throat: No oropharyngeal exudate or posterior oropharyngeal edema.  Eyes: Pupils are equal, round, and reactive to light. Conjunctivae, EOM and lids are normal.  Neck: No JVD present. Carotid bruit is not present. No edema present. No thyroid mass and no thyromegaly present.  Cardiovascular: S1 normal and S2 normal.  Exam reveals no gallop.   No murmur heard. Pulses:      Dorsalis pedis pulses are 2+ on the right side, and 2+ on the left side.  Respiratory: No respiratory distress. He has no wheezes. He has no rhonchi. He has no rales.  GI: Soft. Bowel sounds are normal. There is no tenderness.  Musculoskeletal:       Right  ankle: He exhibits swelling.       Left ankle: He exhibits swelling.  Lymphadenopathy:    He has no cervical adenopathy.  Neurological: He is alert and oriented to person, place, and time. No cranial nerve deficit.  Skin: Skin is warm. Nails show no clubbing.  Legs covered with Unna boots  Psychiatric: He has a normal mood and affect.      Data Reviewed: Basic Metabolic Panel:  Recent Labs Lab 12/02/16 0737 12/04/16 0454 12/04/16 1550 12/06/16 0359  NA 135 137 137 138  K 3.5 3.1* 3.3* 3.4*  CL 94* 99* 98* 101  CO2 24 28 31 30   GLUCOSE 131* 120* 112* 110*  BUN 121* 66* 46* 33*  CREATININE 3.98* 2.64* 1.93* 2.08*  CALCIUM 7.8* 7.8* 8.0* 7.8*  PHOS  --   --  2.8  --    Liver Function Tests:  Recent Labs Lab 12/02/16 0737 12/04/16 1550  AST 177*  --   ALT 121*  --   ALKPHOS 83  --   BILITOT 1.0  --   PROT 7.2  --   ALBUMIN 3.8 3.4*    Recent Labs Lab 12/02/16 0748  AMMONIA 19   CBC:  Recent Labs Lab 12/02/16 0737 12/04/16 0454 12/05/16 0742 12/06/16 0359  WBC 12.9*  --   --   --   NEUTROABS 11.2*  --   --   --   HGB 8.4* 7.8* 7.9* 7.5*  HCT 25.5*  --   --   --  MCV 91.7  --   --   --   PLT 327  --   --   --    Cardiac Enzymes:  Recent Labs Lab 12/02/16 0737 12/04/16 0454  CKTOTAL 2,276* 448*  TROPONINI 0.54*  --    BNP (last 3 results)  Recent Labs  12/02/16 0737  BNP 307.0*     Recent Results (from the past 240 hour(s))  Aerobic/Anaerobic Culture (surgical/deep wound)     Status: None (Preliminary result)   Collection Time: 12/02/16 12:53 PM  Result Value Ref Range Status   Specimen Description LEG LEFT  Final   Special Requests Normal  Final   Gram Stain   Final    RARE WBC PRESENT,BOTH PMN AND MONONUCLEAR ABUNDANT GRAM NEGATIVE RODS RARE GRAM POSITIVE COCCI IN PAIRS RARE GRAM VARIABLE ROD Performed at [facility], [place] [address] [place], [place] [address]    Culture   Final    ABUNDANT PROVIDENCIA  RETTGERI CULTURE REINCUBATED FOR BETTER GROWTH NO ANAEROBES ISOLATED; CULTURE IN PROGRESS FOR 5 DAYS    Report Status PENDING  Incomplete   Organism ID, Bacteria PROVIDENCIA RETTGERI  Final      Susceptibility   Providencia rettgeri - MIC*    AMPICILLIN RESISTANT Resistant     CEFAZOLIN >=64 RESISTANT Resistant     CEFEPIME <=1 SENSITIVE Sensitive     CEFTAZIDIME <=1 SENSITIVE Sensitive     CEFTRIAXONE <=1 SENSITIVE Sensitive     CIPROFLOXACIN <=0.25 SENSITIVE Sensitive     GENTAMICIN <=1 SENSITIVE Sensitive     IMIPENEM 1 SENSITIVE Sensitive     TRIMETH/SULFA <=20 SENSITIVE Sensitive     AMPICILLIN/SULBACTAM 16 INTERMEDIATE Intermediate     PIP/TAZO <=4 SENSITIVE Sensitive     * ABUNDANT PROVIDENCIA RETTGERI  MRSA PCR Screening     Status: None   Collection Time: 12/04/16 12:30 AM  Result Value Ref Range Status   MRSA by PCR NEGATIVE NEGATIVE Final    Comment:        The GeneXpert MRSA Assay (FDA approved for NASAL specimens only), is one component of a comprehensive MRSA colonization surveillance program. It is not intended to diagnose MRSA infection nor to guide or monitor treatment for MRSA infections.       Scheduled Meds: . ciprofloxacin  500 mg Oral QHS  . diclofenac sodium  2 g Topical QID  . febuxostat  40 mg Oral q morning - 10a  . ferrous sulfate  325 mg Oral BID WC  . ipratropium-albuterol  3 mL Nebulization BID  . pantoprazole (PROTONIX) IV  40 mg Intravenous Q12H  . polyethylene glycol  17 g Oral Daily  . torsemide  40 mg Oral BID    Assessment/Plan:  1. Acute kidney injury on chronic kidney disease stage III. Patient received dialysis with quite a bit of fluid removed. Dialysis catheter removed in the right groin. Patient started on 40 mg of torsemide twice a day by nephrology. Need to monitor his response to diuretics. 2. Acute blood loss anemia. Hemoglobin 7.5 today. May end up needing a transfusion during this hospital stay. Gastroenterology  signed off. Patient also guaiac positive. Empiric Protonix. Stopped Xaralto. Started oral iron. 3. Anasarca, lower extremity ulcerations. Continue by mouth Cipro today. Wound care evaluation appreciated and Unna boots replaced. 4. Acute rhabdomyolysis. CPK 2000 on presentation. Discontinued Crestor. CPK improved. 5. History of atrial fibrillation. Risk of stroke will be higher off Xaralto. Hold digoxin right now with acute kidney injury. Digoxin  level still in therapeutic range. As digoxin comes out of the system heart rate may go higher and low-dose Coreg may be needed. 6. Elevated troponin. Likely demand ischemia or false positive with chronic kidney disease. 7. History of obstructive sleep apnea 8. Hyperlipidemia unspecified. Hold Crestor at this time with rhabdomyolysis 9. Weakness. Physical therapy recommended home with home health. The patient's wife is very nervous about this. 10. Moderate to severe pulmonary hypertension seen on echocardiogram last year. Repeat echo ordered but not read yet  Code Status:     Code Status Orders        Start     Ordered   12/02/16 0938  Full code  Continuous     12/02/16 0937    Code Status History    Date Active Date Inactive Code Status Order ID Comments User Context   09/03/2015  5:58 AM 09/17/2015  4:37 PM Full Code [id number]  Lucious Groves, DO Inpatient   01/17/2015 10:56 AM 01/18/2015  1:52 PM Full Code [id number]  Theodoro Grist, MD ED     Family Communication: Spoke with wife on the phone yesterday Disposition Plan: To be determined  Consultants:  Nephrology  Gastroenterology  Vascular surgery  Procedures:  Dialysis catheter and removal  Antibiotics:  Oral Cipro  Time spent: 24 minutes  [place], [place]

## 2016-12-06 NOTE — Progress Notes (Signed)
[facility] [place], [place] 12/06/16  Subjective:   Patient known to our practice from December 2015. He presents via EMS for increasing confusion.  He also has massive edema of his legs, abdomen and testicles.   Room air today 7500 cc removed with HD so far States he feels that his swelling has decreased Denies any nausea vomiting Dialysis catheter was removed last night. After removal, there was some bleeding from the catheter site but it was controlled with pressure.  Objective:  Vital signs in last 24 hours:  Temp:  [98 F (36.7 C)-98.2 F (36.8 C)] 98 F (36.7 C) (07/15 0400) Pulse Rate:  [70-84] 77 (07/15 0400) Resp:  [15-24] 18 (07/15 0400) BP: (103-121)/(44-70) 103/44 (07/15 0400) SpO2:  [91 %-99 %] 99 % (07/15 0400)  Weight change:  Filed Weights   12/02/16 1047 12/03/16 1345 12/03/16 1630  Weight: 94.6 kg (208 lb 8 oz) 94.9 kg (209 lb 3.5 oz) 92.9 kg (204 lb 12.9 oz)    Intake/Output:    Intake/Output Summary (Last 24 hours) at 12/06/16 1002 Last data filed at 12/06/16 0553  Gross per 24 hour  Intake              240 ml  Output             2550 ml  Net            -2310 ml     Physical Exam: General: No acute distress, Sitting up at the side of bed   HEENT Anicteric, moist oral mucous membranes  Neck supple  Pulm/lungs Decreased breath sounds at bases  CVS/Heart Irregular, soft systolic murmur  Abdomen:  Soft, mildly distended  Extremities: Massive edema bilaterally up to the thighs and lower abdomen  Neurologic: Alert, able to follow commands  Skin: No acute rashes          Basic Metabolic Panel:   Recent Labs Lab 12/02/16 0737 12/04/16 0454 12/04/16 1550 12/06/16 0359  NA 135 137 137 138  K 3.5 3.1* 3.3* 3.4*  CL 94* 99* 98* 101  CO2 24 28 31 30   GLUCOSE 131* 120* 112* 110*  BUN 121* 66* 46* 33*  CREATININE 3.98* 2.64* 1.93* 2.08*  CALCIUM 7.8* 7.8* 8.0* 7.8*  PHOS  --   --  2.8  --      CBC:  Recent  Labs Lab 12/02/16 0737 12/04/16 0454 12/05/16 0742 12/06/16 0359  WBC 12.9*  --   --   --   NEUTROABS 11.2*  --   --   --   HGB 8.4* 7.8* 7.9* 7.5*  HCT 25.5*  --   --   --   MCV 91.7  --   --   --   PLT 327  --   --   --       Lab Results  Component Value Date   HEPBSAG Negative 12/02/2016   HEPBSAB Reactive 09/11/2015   HEPBIGM Negative 10/10/2015      Microbiology:  Recent Results (from the past 240 hour(s))  Aerobic/Anaerobic Culture (surgical/deep wound)     Status: None (Preliminary result)   Collection Time: 12/02/16 12:53 PM  Result Value Ref Range Status   Specimen Description LEG LEFT  Final   Special Requests Normal  Final   Gram Stain   Final    RARE WBC PRESENT,BOTH PMN AND MONONUCLEAR ABUNDANT GRAM NEGATIVE RODS RARE GRAM POSITIVE COCCI IN PAIRS RARE GRAM VARIABLE ROD Performed at [facility]  Lab, [address], [place], [place] [address]    Culture   Final    ABUNDANT PROVIDENCIA RETTGERI CULTURE REINCUBATED FOR BETTER GROWTH NO ANAEROBES ISOLATED; CULTURE IN PROGRESS FOR 5 DAYS    Report Status PENDING  Incomplete   Organism ID, Bacteria PROVIDENCIA RETTGERI  Final      Susceptibility   Providencia rettgeri - MIC*    AMPICILLIN RESISTANT Resistant     CEFAZOLIN >=64 RESISTANT Resistant     CEFEPIME <=1 SENSITIVE Sensitive     CEFTAZIDIME <=1 SENSITIVE Sensitive     CEFTRIAXONE <=1 SENSITIVE Sensitive     CIPROFLOXACIN <=0.25 SENSITIVE Sensitive     GENTAMICIN <=1 SENSITIVE Sensitive     IMIPENEM 1 SENSITIVE Sensitive     TRIMETH/SULFA <=20 SENSITIVE Sensitive     AMPICILLIN/SULBACTAM 16 INTERMEDIATE Intermediate     PIP/TAZO <=4 SENSITIVE Sensitive     * ABUNDANT PROVIDENCIA RETTGERI  MRSA PCR Screening     Status: None   Collection Time: 12/04/16 12:30 AM  Result Value Ref Range Status   MRSA by PCR NEGATIVE NEGATIVE Final    Comment:        The GeneXpert MRSA Assay (FDA approved for NASAL specimens only), is one component of  a comprehensive MRSA colonization surveillance program. It is not intended to diagnose MRSA infection nor to guide or monitor treatment for MRSA infections.     Coagulation Studies: No results for input(s): LABPROT, INR in the last 72 hours.  Urinalysis: No results for input(s): COLORURINE, LABSPEC, PHURINE, GLUCOSEU, HGBUR, BILIRUBINUR, KETONESUR, PROTEINUR, UROBILINOGEN, NITRITE, LEUKOCYTESUR in the last 72 hours.  Invalid input(s): APPERANCEUR    Imaging: No results found.   Medications:    . ciprofloxacin  500 mg Oral QHS  . diclofenac sodium  2 g Topical QID  . febuxostat  40 mg Oral q morning - 10a  . ferrous sulfate  325 mg Oral BID WC  . ipratropium-albuterol  3 mL Nebulization BID  . pantoprazole (PROTONIX) IV  40 mg Intravenous Q12H  . polyethylene glycol  17 g Oral Daily   acetaminophen **OR** acetaminophen, oxyCODONE, pramipexole  Assessment/ Plan:  81 y.o.caucasian male With atrial fibrillation, pulmonary hypertension, severe tricuspid regurgitation, obstructive sleep apnea, hypertension, COPD, chronic kidney disease  1.  Acute renal failure, likely cardiorenal syndrome 2.  Chronic kidney disease stage 3, baseline creatinine 1.85/GFR 34 in April 2017 3.  Severe anasarca 4.  Severe tricuspid regurgitation, dilated right atrium 5.  Pulmonary hypertension 6.  Coagulopathy  7.  Hypokalemia  Patient appears to have developed anasarca due to cardiorenal syndrome. He has experienced diuretic failure.  Due to pulmonary hypertension, low normal blood pressure, intravenous diuretics might not be effective.    Plan: Low salt diet 7500 cc of fluid removes with HD so far. Dialysis catheter was removed Saturday. Monitor labs and volume status. Assess response to restarting of diuretics. Torsemide ordered 40 mg twice a day If patient needs further dialysis support for volume control, will get permcath early next week      LOS: [place] 7/15/201810:02  [place], [place]

## 2016-12-07 ENCOUNTER — Telehealth: Payer: Self-pay | Admitting: Family Medicine

## 2016-12-07 LAB — BASIC METABOLIC PANEL
Anion gap: 10 (ref 5–15)
BUN: 36 mg/dL — ABNORMAL HIGH (ref 6–20)
CO2: 28 mmol/L (ref 22–32)
Calcium: 8.3 mg/dL — ABNORMAL LOW (ref 8.9–10.3)
Chloride: 96 mmol/L — ABNORMAL LOW (ref 101–111)
Creatinine, Ser: 2.32 mg/dL — ABNORMAL HIGH (ref 0.61–1.24)
GFR calc Af Amer: 29 mL/min — ABNORMAL LOW (ref >60)
GFR calc non Af Amer: 25 mL/min — ABNORMAL LOW (ref >60)
Glucose, Bld: 114 mg/dL — ABNORMAL HIGH (ref 65–99)
Potassium: 3.3 mmol/L — ABNORMAL LOW (ref 3.5–5.1)
Sodium: 134 mmol/L — ABNORMAL LOW (ref 135–145)

## 2016-12-07 LAB — AEROBIC/ANAEROBIC CULTURE W GRAM STAIN (SURGICAL/DEEP WOUND): Special Requests: NORMAL

## 2016-12-07 LAB — HEMOGLOBIN: Hemoglobin: 7.8 g/dL — ABNORMAL LOW (ref 13.0–18.0)

## 2016-12-07 MED ORDER — POTASSIUM CHLORIDE CRYS ER 20 MEQ PO TBCR
20.0000 meq | EXTENDED_RELEASE_TABLET | Freq: Once | ORAL | Status: AC
  Administered 2016-12-07: 20 meq via ORAL
  Filled 2016-12-07: qty 1

## 2016-12-07 MED ORDER — FERROUS SULFATE 325 (65 FE) MG PO TABS: 325.0000 mg | ORAL_TABLET | Freq: Two times a day (BID) | ORAL | 3 refills | Status: DC

## 2016-12-07 MED ORDER — POLYETHYLENE GLYCOL 3350 17 G PO PACK: 17.0000 g | PACK | Freq: Every day | ORAL | 0 refills | Status: DC

## 2016-12-07 MED ORDER — TORSEMIDE 20 MG PO TABS: 40.0000 mg | ORAL_TABLET | Freq: Two times a day (BID) | ORAL | Status: DC

## 2016-12-07 MED ORDER — CIPROFLOXACIN HCL 500 MG PO TABS: 500.0000 mg | ORAL_TABLET | Freq: Every day | ORAL | 0 refills | Status: DC

## 2016-12-07 NOTE — Telephone Encounter (Signed)
Pt is being discharged from [facility] today for a acute kidney injury.  I have scheduled a hospital follow up/MW

## 2016-12-07 NOTE — Progress Notes (Signed)
[facility], [place] 12/07/16  Subjective:  Creatinine is up slightly today to 2.3. BUN 36. Patient has had 7500 cc of fluid removal does far. Urine output yesterday was good at 2.8 L.   Objective:  Vital signs in last 24 hours:  Temp:  [97.8 F (36.6 C)-97.9 F (36.6 C)] 97.8 F (36.6 C) (07/16 1206) Pulse Rate:  [74-88] 79 (07/16 1214) Resp:  [16-20] 16 (07/16 1206) BP: (112-133)/(49-78) 112/49 (07/16 1214) SpO2:  [98 %-99 %] 98 % (07/16 1206)  Weight change:  Filed Weights   12/02/16 1047 12/03/16 1345 12/03/16 1630  Weight: 94.6 kg (208 lb 8 oz) 94.9 kg (209 lb 3.5 oz) 92.9 kg (204 lb 12.9 oz)    Intake/Output:    Intake/Output Summary (Last 24 hours) at 12/07/16 1502 Last data filed at 12/07/16 1441  Gross per 24 hour  Intake              720 ml  Output             4875 ml  Net            -4155 ml     Physical Exam: General: No acute distress, laying in bed  HEENT Anicteric, moist oral mucous membranes  Neck supple  Pulm/lungs Decreased breath sounds at bases, normal effort  CVS/Heart Irregular, soft systolic murmur  Abdomen:  Soft, mildly distended  Extremities: Massive edema bilaterally up to the thighs and lower abdomen  Neurologic: Alert, able to follow commands  Skin: No acute rashes          Basic Metabolic Panel:   Recent Labs Lab 12/02/16 0737 12/04/16 0454 12/04/16 1550 12/06/16 0359 12/07/16 0425  NA 135 137 137 138 134*  K 3.5 3.1* 3.3* 3.4* 3.3*  CL 94* 99* 98* 101 96*  CO2 24 28 31 30 28   GLUCOSE 131* 120* 112* 110* 114*  BUN 121* 66* 46* 33* 36*  CREATININE 3.98* 2.64* 1.93* 2.08* 2.32*  CALCIUM 7.8* 7.8* 8.0* 7.8* 8.3*  PHOS  --   --  2.8  --   --      CBC:  Recent Labs Lab 12/02/16 0737 12/04/16 0454 12/05/16 0742 12/06/16 0359 12/07/16 0425  WBC 12.9*  --   --   --   --   NEUTROABS 11.2*  --   --   --   --   HGB 8.4* 7.8* 7.9* 7.5* 7.8*  HCT 25.5*  --   --   --   --   MCV 91.7  --    --   --   --   PLT 327  --   --   --   --       Lab Results  Component Value Date   HEPBSAG Negative 12/02/2016   HEPBSAB Reactive 09/11/2015   HEPBIGM Negative 10/10/2015      Microbiology:  Recent Results (from the past 240 hour(s))  Aerobic/Anaerobic Culture (surgical/deep wound)     Status: None   Collection Time: 12/02/16 12:53 PM  Result Value Ref Range Status   Specimen Description LEG LEFT  Final   Special Requests Normal  Final   Gram Stain   Final    RARE WBC PRESENT,BOTH PMN AND MONONUCLEAR ABUNDANT GRAM NEGATIVE RODS RARE GRAM POSITIVE COCCI IN PAIRS RARE GRAM VARIABLE ROD    Culture   Final    ABUNDANT PROVIDENCIA RETTGERI NO ANAEROBES ISOLATED    Report Status 12/07/2016 FINAL  Final  Organism ID, Bacteria PROVIDENCIA RETTGERI  Final      Susceptibility   Providencia rettgeri - MIC*    AMPICILLIN RESISTANT Resistant     CEFAZOLIN >=64 RESISTANT Resistant     CEFEPIME <=1 SENSITIVE Sensitive     CEFTAZIDIME <=1 SENSITIVE Sensitive     CEFTRIAXONE <=1 SENSITIVE Sensitive     CIPROFLOXACIN <=0.25 SENSITIVE Sensitive     GENTAMICIN <=1 SENSITIVE Sensitive     IMIPENEM 1 SENSITIVE Sensitive     TRIMETH/SULFA <=20 SENSITIVE Sensitive     AMPICILLIN/SULBACTAM 16 INTERMEDIATE Intermediate     PIP/TAZO <=4 SENSITIVE Sensitive     * ABUNDANT PROVIDENCIA RETTGERI  MRSA PCR Screening     Status: None   Collection Time: 12/04/16 12:30 AM  Result Value Ref Range Status   MRSA by PCR NEGATIVE NEGATIVE Final    Comment:        The GeneXpert MRSA Assay (FDA approved for NASAL specimens only), is one component of a comprehensive MRSA colonization surveillance program. It is not intended to diagnose MRSA infection nor to guide or monitor treatment for MRSA infections.     Coagulation Studies: No results for input(s): LABPROT, INR in the last 72 hours.  Urinalysis: No results for input(s): COLORURINE, LABSPEC, PHURINE, GLUCOSEU, HGBUR, BILIRUBINUR,  KETONESUR, PROTEINUR, UROBILINOGEN, NITRITE, LEUKOCYTESUR in the last 72 hours.  Invalid input(s): APPERANCEUR    Imaging: No results found.   Medications:    . ciprofloxacin  500 mg Oral QHS  . diclofenac sodium  2 g Topical QID  . febuxostat  40 mg Oral q morning - 10a  . ferrous sulfate  325 mg Oral BID WC  . pantoprazole (PROTONIX) IV  40 mg Intravenous Q12H  . polyethylene glycol  17 g Oral Daily  . potassium chloride  20 mEq Oral Once  . torsemide  40 mg Oral BID   acetaminophen **OR** acetaminophen, ipratropium-albuterol, oxyCODONE, pramipexole  Assessment/ Plan:  81 y.o.caucasian male With atrial fibrillation, pulmonary hypertension, severe tricuspid regurgitation, obstructive sleep apnea, hypertension, COPD, chronic kidney disease  1.  Acute renal failure, likely cardiorenal syndrome 2.  Chronic kidney disease stage 3, baseline creatinine 1.85/GFR 34 in April 2017 3.  Severe anasarca/generalized edema 4.  Severe tricuspid regurgitation, dilated right atrium 5.  Pulmonary hypertension 6.  Coagulopathy  7.  Hypokalemia  Patient appears to have developed anasarca due to cardiorenal syndrome. He has experienced diuretic failure.  Due to pulmonary hypertension, low normal blood pressure, intravenous diuretics may not be effective.    Plan: It appears that the patient's diuretic response has improved.  Urine output yesterday was 2.8 L.  No indication for dialysis at the moment however he may require this again in the future if he has worsening azotemia or decreasing urine output.  For now continue the patient on torsemide.  Continue to monitor renal function as well as serum potassium.  He will need followup in our clinicin one to 2 weeks.      LOS: [place] 7/16/20183:02 PM  [facility] [place], [place]

## 2016-12-07 NOTE — Care Management Important Message (Signed)
Important Message  Patient Details  Name: Vincent Mason MRN: [id number] Date of Birth: April 06, 1936   Medicare Important Message Given:  Yes    Beverly Sessions, RN 12/07/2016, 12:29 PM

## 2016-12-07 NOTE — Care Management (Signed)
Patient to discharge home today.  Home health orders have been placed for RN and [place] with [facility] care notified.  Patient to have una boots changes twice a week.  RNCM signing off.

## 2016-12-07 NOTE — Progress Notes (Signed)
Discharge instructions reviewed with the patient and his wife.  Unna boots reapplied.  Patient sent out via wheelchair to his car

## 2016-12-07 NOTE — Discharge Summary (Signed)
[facility] [place] NAME: Vincent Mason    MR#:  [id number]  DATE OF BIRTH:  [birth year]  DATE OF ADMISSION:  12/02/2016   ADMITTING PHYSICIAN: Loletha Grayer, MD  DATE OF DISCHARGE:  12/07/2016  PRIMARY CARE PHYSICIAN: Birdie Sons, MD   ADMISSION DIAGNOSIS:   Arm pain [M79.603] Uremic encephalopathy [G93.41, N19] Elevated troponin I level [R74.8] AKI (acute kidney injury) ([place]) [N17.9]  DISCHARGE DIAGNOSIS:   Active Problems:   Acute kidney injury ([place])   Blood in stool   Anemia due to chronic blood loss   SECONDARY DIAGNOSIS:   Past Medical History:  Diagnosis Date  . Acute encephalopathy 09/03/2015  . Atrial fibrillation and flutter [facility]) 2013   Dr Nehemiah Massed at [facility]  on [address].   . C. difficile colitis 09/12/2015  . COPD (chronic obstructive pulmonary disease) ([place])   . HCAP (healthcare-associated pneumonia) 09/12/2015  . Hyperlipidemia   . Hypertension   . Kidney disease, chronic, stage III (moderate, EGFR 30-59 ml/min)    ARF 2013  . OSA (obstructive sleep apnea)    non-compliant with CPAP.   Marland Kitchen Severe sepsis with septic shock ([place]) 09/12/2015    HOSPITAL COURSE:   81 year old male with past medical history significant for A. fib on Xarelto, COPD, hypertension, CK D stage III at baseline, sleep apnea presents to the hospital secondary to weakness and anasarca.  #1 anasarca-fluid overload, has significant pulmonary hypertension and elevated right-sided pressures. -Echocardiogram has been revealed. EF is 55% but with elevated pulmonary arterial hypertension. -Started on dialysis for volume overload on 12/02/2016. Receive dialysis up until 12/05/2016. -Temporary dialysis catheter has been removed. Anasarca as much improved. Renal function also improved as well. Creatinine is slightly worse at 2.3 today. He is back on his torsemide 40 mg twice a day. -No acute indication for further dialysis at this time. He can  follow up with nephrology within a week  #2 acute on chronic anemia-anemia of chronic disease. Hemoglobin has been stable around 7.5-8 range. Did not require any transfusion at this time. -Appreciate GI consult. They have recommended outpatient EGD and colon studies. -Will be discharged on Protonix -On iron supplements  #3 atrial fibrillation-rate controlled. Hold Xarelto due to his significant anemia.  #4 mild acute cystitis-on Cipro, renally dosed.  Very insistent on going home and is being discharged today after checking with nephrology. Physical therapy recommended home health.  DISCHARGE CONDITIONS:   Guarded CONSULTS OBTAINED:   Treatment Team:  Murlean Iba, MD Schnier, Dolores Lory, MD  DRUG ALLERGIES:   Allergies  Allergen Reactions  . Decongestant  [Oxymetazoline]    DISCHARGE MEDICATIONS:   Allergies as of 12/07/2016      Reactions   Decongestant  [oxymetazoline]       Medication List    STOP taking these medications   DIGOX 0.125 MG tablet Generic drug:  digoxin   metolazone 5 MG tablet Commonly known as:  ZAROXOLYN   rivaroxaban 20 MG Tabs tablet Commonly known as:  XARELTO   rosuvastatin 40 MG tablet Commonly known as:  CRESTOR   sildenafil 20 MG tablet Commonly known as:  REVATIO     TAKE these medications   ciprofloxacin 500 MG tablet Commonly known as:  CIPRO Take 1 tablet (500 mg total) by mouth at bedtime. X 4 more days   ferrous sulfate 325 (65 FE) MG tablet Take 1 tablet (325 mg total) by mouth 2 (two) times daily with a meal.  methocarbamol 750 MG tablet Commonly known as:  ROBAXIN Take 750 mg by mouth at bedtime as needed for muscle spasms.   oxyCODONE 15 MG immediate release tablet Commonly known as:  ROXICODONE Take 1 tablet (15 mg total) by mouth every 6 (six) hours as needed for pain.   polyethylene glycol packet Commonly known as:  MIRALAX / GLYCOLAX Take 17 g by mouth daily.   potassium chloride SA 20 MEQ  tablet Commonly known as:  K-DUR,KLOR-CON Take 1 tablet (20 mEq total) by mouth 2 (two) times daily.   pramipexole 0.25 MG tablet Commonly known as:  MIRAPEX TAKE ONE TO TWO TABLETS BY MOUTH AT BEDTIME AS NEEDED FOR RESTLESS LEG   torsemide 20 MG tablet Commonly known as:  DEMADEX Take 2 tablets (40 mg total) by mouth 2 (two) times daily.   ULORIC 80 MG Tabs Generic drug:  Febuxostat TAKE ONE (1) TABLET EACH DAY        DISCHARGE INSTRUCTIONS:   1. PCP follow-up in 1-2 weeks 2. Nephrology follow-up in 5 days and will need a follow-up BMP and CPK at the time  DIET:   Cardiac diet  ACTIVITY:   Activity as tolerated  OXYGEN:   Home Oxygen: No.  Oxygen Delivery: room air  DISCHARGE LOCATION:   home   If you experience worsening of your admission symptoms, develop shortness of breath, life threatening emergency, suicidal or homicidal thoughts you must seek medical attention immediately by calling 911 or calling your MD immediately  if symptoms less severe.  You Must read complete instructions/literature along with all the possible adverse reactions/side effects for all the Medicines you take and that have been prescribed to you. Take any new Medicines after you have completely understood and accpet all the possible adverse reactions/side effects.   Please note  You were cared for by a hospitalist during your hospital stay. If you have any questions about your discharge medications or the care you received while you were in the hospital after you are discharged, you can call the unit and asked to speak with the hospitalist on call if the hospitalist that took care of you is not available. Once you are discharged, your primary care physician will handle any further medical issues. Please note that NO REFILLS for any discharge medications will be authorized once you are discharged, as it is imperative that you return to your primary care physician (or establish a relationship  with a primary care physician if you do not have one) for your aftercare needs so that they can reassess your need for medications and monitor your lab values.    On the day of Discharge:  VITAL SIGNS:   Blood pressure (!) 112/49, pulse 79, temperature 97.8 F (36.6 C), temperature source Oral, resp. rate 16, height 5' 10"  (1.778 m), weight 92.9 kg (204 lb 12.9 oz), SpO2 98 %.  PHYSICAL EXAMINATION:    GENERAL:  81 y.o.-year-old patient lying in the bed with no acute distress.  EYES: Pupils equal, round, reactive to light and accommodation. No scleral icterus. Extraocular muscles intact.  HEENT: Head atraumatic, normocephalic. Oropharynx and nasopharynx clear.  NECK:  Supple, no jugular venous distention. No thyroid enlargement, no tenderness.  LUNGS: Normal breath sounds bilaterally, no wheezing, rales,rhonchi or crepitation. No use of accessory muscles of respiration.  CARDIOVASCULAR: S1, S2 normal. No murmurs, rubs, or gallops.  ABDOMEN: Soft, non-tender, non-distended. Bowel sounds present. No organomegaly or mass.  EXTREMITIES: No pedal edema, cyanosis, or clubbing.  NEUROLOGIC:  Cranial nerves II through XII are intact. Muscle strength 5/5 in all extremities. Sensation intact. Gait not checked.  PSYCHIATRIC: The patient is alert and oriented x 3. Anxious and pressured speech SKIN: No obvious rash, lesion, or ulcer.   DATA REVIEW:   CBC  Recent Labs Lab 12/02/16 0737  12/07/16 0425  WBC 12.9*  --   --   HGB 8.4*  < > 7.8*  HCT 25.5*  --   --   PLT 327  --   --   < > = values in this interval not displayed.  Chemistries   Recent Labs Lab 12/02/16 0737  12/07/16 0425  NA 135  < > 134*  K 3.5  < > 3.3*  CL 94*  < > 96*  CO2 24  < > 28  GLUCOSE 131*  < > 114*  BUN 121*  < > 36*  CREATININE 3.98*  < > 2.32*  CALCIUM 7.8*  < > 8.3*  AST 177*  --   --   ALT 121*  --   --   ALKPHOS 83  --   --   BILITOT 1.0  --   --   < > = values in this interval not  displayed.   Microbiology Results  Results for orders placed or performed during the hospital encounter of 12/02/16  Aerobic/Anaerobic Culture (surgical/deep wound)     Status: None   Collection Time: 12/02/16 12:53 PM  Result Value Ref Range Status   Specimen Description LEG LEFT  Final   Special Requests Normal  Final   Gram Stain   Final    RARE WBC PRESENT,BOTH PMN AND MONONUCLEAR ABUNDANT GRAM NEGATIVE RODS RARE GRAM POSITIVE COCCI IN PAIRS RARE GRAM VARIABLE ROD    Culture   Final    ABUNDANT PROVIDENCIA RETTGERI NO ANAEROBES ISOLATED    Report Status 12/07/2016 FINAL  Final   Organism ID, Bacteria PROVIDENCIA RETTGERI  Final      Susceptibility   Providencia rettgeri - MIC*    AMPICILLIN RESISTANT Resistant     CEFAZOLIN >=64 RESISTANT Resistant     CEFEPIME <=1 SENSITIVE Sensitive     CEFTAZIDIME <=1 SENSITIVE Sensitive     CEFTRIAXONE <=1 SENSITIVE Sensitive     CIPROFLOXACIN <=0.25 SENSITIVE Sensitive     GENTAMICIN <=1 SENSITIVE Sensitive     IMIPENEM 1 SENSITIVE Sensitive     TRIMETH/SULFA <=20 SENSITIVE Sensitive     AMPICILLIN/SULBACTAM 16 INTERMEDIATE Intermediate     PIP/TAZO <=4 SENSITIVE Sensitive     * ABUNDANT PROVIDENCIA RETTGERI  MRSA PCR Screening     Status: None   Collection Time: 12/04/16 12:30 AM  Result Value Ref Range Status   MRSA by PCR NEGATIVE NEGATIVE Final    Comment:        The GeneXpert MRSA Assay (FDA approved for NASAL specimens only), is one component of a comprehensive MRSA colonization surveillance program. It is not intended to diagnose MRSA infection nor to guide or monitor treatment for MRSA infections.     RADIOLOGY:  No results found.   Management plans discussed with the patient, family and they are in agreement.  CODE STATUS:     Code Status Orders        Start     Ordered   12/02/16 0938  Full code  Continuous     12/02/16 0937    Code Status History    Date Active Date Inactive Code Status Order  ID Comments User  Context   09/03/2015  5:58 AM 09/17/2015  4:37 PM Full Code [id number]  Lucious Groves, DO Inpatient   01/17/2015 10:56 AM 01/18/2015  1:52 PM Full Code [id number]  Theodoro Grist, MD ED      TOTAL TIME TAKING CARE OF THIS PATIENT: 37 minutes.    Gladstone Lighter M.D on 12/07/2016 at 1:56 PM  Between 7am to 6pm - Pager - [phone]  After 6pm go to [email or web address] - Proofreader  [facility]  Office  [phone]  CC: Primary care physician; Birdie Sons, MD   Note: This dictation was prepared with Dragon dictation along with smaller phrase technology. Any transcriptional errors that result from this process are unintentional.

## 2016-12-07 NOTE — Progress Notes (Signed)
Chaplain made a follow up visit with pt. Pt was lying on bed watching TV at the time of this visit. Pt was coherent and in a conversational mood this morning and states he is satisfied with doctors treating him. Pt states he has not completed the AD yet because he was still waiting on his wife to help him with it. Pt says he is ready to go home as soon as his doctor discharges him. Pt requested [place] to ask nurse to empty the urinal bottle, which [place] did. [place] provided emotional support and presence to pt.    12/07/16 1100  Clinical Encounter Type  Visited With Patient  Visit Type Follow-up  Referral From Chaplain  Consult/Referral To Chaplain  Spiritual Encounters  Spiritual Needs Other (Comment)

## 2016-12-09 ENCOUNTER — Encounter (INDEPENDENT_AMBULATORY_CARE_PROVIDER_SITE_OTHER): Payer: Medicare Other

## 2016-12-09 ENCOUNTER — Other Ambulatory Visit: Payer: Self-pay | Admitting: *Deleted

## 2016-12-09 ENCOUNTER — Encounter: Payer: Self-pay | Admitting: *Deleted

## 2016-12-09 ENCOUNTER — Encounter (INDEPENDENT_AMBULATORY_CARE_PROVIDER_SITE_OTHER): Payer: Self-pay

## 2016-12-09 NOTE — Patient Outreach (Signed)
Successful telephone encounter to Vincent Mason 81 year old male, follow up on referral received 12/07/16 from [facility] hospital liaison for Community CM services - transition of care, recent hospitalization July 11-16,2018 for Acute Kidney injury,blood in stool, Anemia due to chronic blood loss.   Pt's history includes but not limited to CAD, Chronic kidney disease stage III, COPD, Hypertension.   Spoke with pt, HIPAA identifiers provided, discussed purpose of call - follow up on referral for transition of care.    Pt reports has fluid in legs/groin but feels good.   Pt reports had dialysis yesterday in the hospital, lost 25-30 lbs of fluid, today fluid  coming back, told that could happen, no change in weight from yesterday to today- remain 215 lbs. Review of pt's discharge summary shows pt discharged home 12/07/16 to which pt was insistent it was yesterday.   Pt reports to see Dr. Candiss Norse tomorrow - test and see if can be can be controlled with medications.  Pt reports to determine with PCP whether to have home health or do outpatient therapy.   Pt reports taking all medications as ordered, does pill planner, spouse makes sure he takes his medications.   Pt reports unable to review medications with RN CM at this time as need to talk to spouse, currently not here.  RN CM discussed [facility] transition of care program- follow 31 days (weekly phone calls, a home visit) no cost to pt.     Plan:  Barrier letter sent by in basket to Dr. Caryn Section informing of [facility] involvement.            As discussed with pt, plan to continue to follow for transition of care, follow up again next week telephonically.   [name]   [facility]  [phone]

## 2016-12-09 NOTE — Telephone Encounter (Signed)
Transition Care Management Follow-up Telephone Call    Date discharged? 12/07/16  How have you been since you were released from the hospital? Recovering, but pt has noticed some fluid retention in his legs and groin. Denies pain and redness at site. Pt states he will see Dr Candiss Norse in 2 days and he is going to add a medication to help with this.   Any patient concerns? Fluid retention   Items Reviewed:  Medications reviewed: Yes  Allergies reviewed: Yes  Dietary changes reviewed: Yes, low sodium heart healthy diet  Referrals reviewed: Yes, Dr. Candiss Norse for 12/11/16   Functional Questionnaire:  Independent - I Dependent - D    Activities of Daily Living (ADLs):    Personal hygiene - Needs assistance Dressing - Needs assistances Eating - I Maintaining continence - I Transferring - I   Independent Activities of Daily Living (iADLs): Basic communication skills - I Transportation - I Meal preparation - I Shopping - I Housework - I Managing medications - D  Managing personal finances - I   Confirmed importance and date/time of follow-up visits scheduled YES  Provider Appointment booked with PCP 12/15/16 @ 11:00 AM.  Confirmed with patient if condition begins to worsen call PCP or go to the ER.  Patient was given the office number and encouraged to call back with question or concerns: YES

## 2016-12-10 ENCOUNTER — Other Ambulatory Visit: Payer: Self-pay | Admitting: Family Medicine

## 2016-12-10 MED ORDER — OXYCODONE HCL 15 MG PO TABS: 15.0000 mg | ORAL_TABLET | Freq: Four times a day (QID) | ORAL | 0 refills | Status: DC | PRN

## 2016-12-10 NOTE — Telephone Encounter (Signed)
Pt contacted office for refill request on the following medications:  [phone]/MW    oxyCODONE (ROXICODONE) 15 MG immediate release tablet

## 2016-12-11 ENCOUNTER — Telehealth: Payer: Self-pay

## 2016-12-11 DIAGNOSIS — N183 Chronic kidney disease, stage 3 (moderate): Secondary | ICD-10-CM | POA: Diagnosis not present

## 2016-12-11 DIAGNOSIS — N179 Acute kidney failure, unspecified: Secondary | ICD-10-CM | POA: Diagnosis not present

## 2016-12-11 DIAGNOSIS — I272 Pulmonary hypertension, unspecified: Secondary | ICD-10-CM | POA: Diagnosis not present

## 2016-12-11 DIAGNOSIS — R601 Generalized edema: Secondary | ICD-10-CM | POA: Diagnosis not present

## 2016-12-11 NOTE — Telephone Encounter (Signed)
Vincent Mason with [facility] called to inform you that pt has refused their services twice. He needs to be seen by home health for an [place] boot change and a start of service visit. They have moved his start of service visit this weekend. They wanted you to be aware that they are not allowed to see him if he refuses treatment again.

## 2016-12-12 DIAGNOSIS — D500 Iron deficiency anemia secondary to blood loss (chronic): Secondary | ICD-10-CM | POA: Diagnosis not present

## 2016-12-12 DIAGNOSIS — I872 Venous insufficiency (chronic) (peripheral): Secondary | ICD-10-CM | POA: Diagnosis not present

## 2016-12-12 DIAGNOSIS — M6282 Rhabdomyolysis: Secondary | ICD-10-CM | POA: Diagnosis not present

## 2016-12-12 DIAGNOSIS — N183 Chronic kidney disease, stage 3 (moderate): Secondary | ICD-10-CM | POA: Diagnosis not present

## 2016-12-12 DIAGNOSIS — G4733 Obstructive sleep apnea (adult) (pediatric): Secondary | ICD-10-CM | POA: Diagnosis not present

## 2016-12-12 DIAGNOSIS — I4891 Unspecified atrial fibrillation: Secondary | ICD-10-CM | POA: Diagnosis not present

## 2016-12-12 DIAGNOSIS — J449 Chronic obstructive pulmonary disease, unspecified: Secondary | ICD-10-CM | POA: Diagnosis not present

## 2016-12-12 DIAGNOSIS — L97821 Non-pressure chronic ulcer of other part of left lower leg limited to breakdown of skin: Secondary | ICD-10-CM | POA: Diagnosis not present

## 2016-12-12 DIAGNOSIS — Z4800 Encounter for change or removal of nonsurgical wound dressing: Secondary | ICD-10-CM | POA: Diagnosis not present

## 2016-12-12 DIAGNOSIS — I129 Hypertensive chronic kidney disease with stage 1 through stage 4 chronic kidney disease, or unspecified chronic kidney disease: Secondary | ICD-10-CM | POA: Diagnosis not present

## 2016-12-12 DIAGNOSIS — R238 Other skin changes: Secondary | ICD-10-CM | POA: Diagnosis not present

## 2016-12-14 DIAGNOSIS — I071 Rheumatic tricuspid insufficiency: Secondary | ICD-10-CM | POA: Diagnosis not present

## 2016-12-14 DIAGNOSIS — R601 Generalized edema: Secondary | ICD-10-CM | POA: Diagnosis not present

## 2016-12-14 DIAGNOSIS — E876 Hypokalemia: Secondary | ICD-10-CM | POA: Diagnosis not present

## 2016-12-14 DIAGNOSIS — R600 Localized edema: Secondary | ICD-10-CM | POA: Diagnosis not present

## 2016-12-14 DIAGNOSIS — N183 Chronic kidney disease, stage 3 (moderate): Secondary | ICD-10-CM | POA: Diagnosis not present

## 2016-12-14 DIAGNOSIS — I872 Venous insufficiency (chronic) (peripheral): Secondary | ICD-10-CM | POA: Diagnosis not present

## 2016-12-14 DIAGNOSIS — L97821 Non-pressure chronic ulcer of other part of left lower leg limited to breakdown of skin: Secondary | ICD-10-CM | POA: Diagnosis not present

## 2016-12-14 DIAGNOSIS — R238 Other skin changes: Secondary | ICD-10-CM | POA: Diagnosis not present

## 2016-12-14 DIAGNOSIS — I10 Essential (primary) hypertension: Secondary | ICD-10-CM | POA: Diagnosis not present

## 2016-12-14 DIAGNOSIS — I482 Chronic atrial fibrillation: Secondary | ICD-10-CM | POA: Diagnosis not present

## 2016-12-14 DIAGNOSIS — I25810 Atherosclerosis of coronary artery bypass graft(s) without angina pectoris: Secondary | ICD-10-CM | POA: Diagnosis not present

## 2016-12-14 DIAGNOSIS — R42 Dizziness and giddiness: Secondary | ICD-10-CM | POA: Diagnosis not present

## 2016-12-14 DIAGNOSIS — M6282 Rhabdomyolysis: Secondary | ICD-10-CM | POA: Diagnosis not present

## 2016-12-14 DIAGNOSIS — D500 Iron deficiency anemia secondary to blood loss (chronic): Secondary | ICD-10-CM | POA: Diagnosis not present

## 2016-12-14 DIAGNOSIS — R0602 Shortness of breath: Secondary | ICD-10-CM | POA: Diagnosis not present

## 2016-12-14 DIAGNOSIS — I129 Hypertensive chronic kidney disease with stage 1 through stage 4 chronic kidney disease, or unspecified chronic kidney disease: Secondary | ICD-10-CM | POA: Diagnosis not present

## 2016-12-14 DIAGNOSIS — E782 Mixed hyperlipidemia: Secondary | ICD-10-CM | POA: Diagnosis not present

## 2016-12-15 ENCOUNTER — Encounter: Payer: Self-pay | Admitting: Family Medicine

## 2016-12-15 ENCOUNTER — Ambulatory Visit (INDEPENDENT_AMBULATORY_CARE_PROVIDER_SITE_OTHER): Payer: Medicare Other | Admitting: Family Medicine

## 2016-12-15 VITALS — BP 104/58 | HR 71 | Temp 97.5°F | Resp 16 | Ht 70.0 in | Wt 211.0 lb

## 2016-12-15 DIAGNOSIS — I272 Pulmonary hypertension, unspecified: Secondary | ICD-10-CM

## 2016-12-15 DIAGNOSIS — N183 Chronic kidney disease, stage 3 unspecified: Secondary | ICD-10-CM

## 2016-12-15 DIAGNOSIS — D649 Anemia, unspecified: Secondary | ICD-10-CM | POA: Diagnosis not present

## 2016-12-15 DIAGNOSIS — I5033 Acute on chronic diastolic (congestive) heart failure: Secondary | ICD-10-CM | POA: Diagnosis not present

## 2016-12-15 NOTE — Progress Notes (Signed)
Patient: Vincent Mason Male    DOB: [birth year]   81 y.o.   MRN: [id number] Visit Date: 12/15/2016  Today's Provider: Lelon Huh, MD   Chief Complaint  Patient presents with  . Hospitalization Follow-up   Subjective:    HPI   Follow up Hospitalization  Patient was admitted to [facility] on 12/02/2016 and discharged on 12/07/2016. He was treated for Acute kidney injury and blood in stool. Also anemia due to blood loss. Treatment for this included EKG, temporary dialyses, labs. Patient discharged home with Cipro due to mild cystitis. He required dialysis while hospitalized for fluid overload secondary to a-fib and severe pulmonary hypertension He has follow up with cardiology yesterday and was to be referred to GI for anemia.  He had follow up with Dr. Candiss Norse on 12/11/2016 and started on spironolactone and restarted on metolazone He has another follow up with Dr. Candiss Norse tomorrow.  Telephone follow up was done on yes He reports good compliance with treatment. He reports this condition is Unchanged.  ----------------------------------------------------------------  Patient reports that he is not feeling much better since release from Hospital. Patient is feeling very fatigued.   Lab Results  Component Value Date   FERRITIN 100 12/06/2016      Allergies  Allergen Reactions  . Decongestant  [Oxymetazoline]      Current Outpatient Prescriptions:  .  ferrous sulfate 325 (65 FE) MG tablet, Take 1 tablet (325 mg total) by mouth 2 (two) times daily with a meal., Disp: 60 tablet, Rfl: 3 .  methocarbamol (ROBAXIN) 750 MG tablet, Take 750 mg by mouth at bedtime as needed for muscle spasms., Disp: , Rfl:  .  metoprolol succinate (TOPROL-XL) 100 MG 24 hr tablet, Take 100 mg by mouth daily., Disp: , Rfl:  .  oxyCODONE (ROXICODONE) 15 MG immediate release tablet, Take 1 tablet (15 mg total) by mouth every 6 (six) hours as needed for pain., Disp: 120 tablet, Rfl: 0 .  polyethylene glycol  (MIRALAX / GLYCOLAX) packet, Take 17 g by mouth daily., Disp: 14 each, Rfl: 0 .  pramipexole (MIRAPEX) 0.25 MG tablet, TAKE ONE TO TWO TABLETS BY MOUTH AT BEDTIME AS NEEDED FOR RESTLESS LEG, Disp: 60 tablet, Rfl: 5 .  spironolactone (ALDACTONE) 25 MG tablet, Take 25 mg by mouth daily., Disp: , Rfl:  .  torsemide (DEMADEX) 20 MG tablet, Take 2 tablets (40 mg total) by mouth 2 (two) times daily., Disp: , Rfl:  .  ULORIC 80 MG TABS, TAKE ONE (1) TABLET EACH DAY, Disp: 30 each, Rfl: 12 .  potassium chloride SA (K-DUR,KLOR-CON) 20 MEQ tablet, Take 1 tablet (20 mEq total) by mouth 2 (two) times daily. (Patient not taking: Reported on 12/15/2016), Disp: 60 tablet, Rfl: 5  Review of Systems  Constitutional: Positive for fatigue. Negative for appetite change, chills and fever.  Respiratory: Negative for chest tightness, shortness of breath and wheezing.   Cardiovascular: Negative for chest pain and palpitations.  Gastrointestinal: Negative for abdominal pain, nausea and vomiting.    Social History  Substance Use Topics  . Smoking status: Former Research scientist (life sciences)  . Smokeless tobacco: Never Used     Comment: Quit in 1990  . Alcohol use No   Objective:   BP (!) 104/58 (BP Location: Right Arm, Patient Position: Sitting, Cuff Size: Large)   Pulse 71   Temp (!) 97.5 F (36.4 C) (Oral)   Resp 16   Ht 5\' 10"  (1.778 m)   Wt 211 lb (  95.7 kg)   SpO2 99%   BMI 30.28 kg/m  Vitals:   12/15/16 1114  BP: (!) 104/58  Pulse: 71  Resp: 16  Temp: (!) 97.5 F (36.4 C)  TempSrc: Oral  SpO2: 99%  Weight: 211 lb (95.7 kg)  Height: 5\' 10"  (1.778 m)     Physical Exam   General Appearance:    Alert, cooperative, no distress  Eyes:    PERRL, conjunctiva/corneas clear, EOM's intact       Lungs:     Clear to auscultation bilaterally, respirations unlabored  Heart:    Regular rate and rhythm  Neurologic:   Awake, alert, oriented x 3. No apparent focal neurological           defect.           Assessment &  Plan:     1. Hypertensive pulmonary vascular disease ([place])   2. Chronic kidney disease (CKD), stage III (moderate) Follow up with renal tomorrow as scheduled.  - Renal function panel - VITAMIN D 25 Hydroxy (Vit-D Deficiency, Fractures)  3. Anemia, unspecified type Patient reports GI referral in progress by [facility].  - CBC - Vitamin B12 - Iron and TIBC - Folate - Retic  4. Acute on chronic diastolic CHF (congestive heart failure), NYHA class 1 (HCC)        Lelon Huh, MD  [facility]

## 2016-12-16 DIAGNOSIS — D649 Anemia, unspecified: Secondary | ICD-10-CM | POA: Diagnosis not present

## 2016-12-16 DIAGNOSIS — R140 Abdominal distension (gaseous): Secondary | ICD-10-CM | POA: Diagnosis not present

## 2016-12-16 DIAGNOSIS — N200 Calculus of kidney: Secondary | ICD-10-CM | POA: Diagnosis not present

## 2016-12-16 LAB — RENAL FUNCTION PANEL
Albumin: 4.4 g/dL (ref 3.5–4.8)
BUN/Creatinine Ratio: 21 (ref 10–24)
BUN: 54 mg/dL — ABNORMAL HIGH (ref 8–27)
CO2: 24 mmol/L (ref 20–29)
Calcium: 9 mg/dL (ref 8.6–10.2)
Chloride: 92 mmol/L — ABNORMAL LOW (ref 96–106)
Creatinine, Ser: 2.62 mg/dL — ABNORMAL HIGH (ref 0.76–1.27)
GFR calc Af Amer: 26 mL/min/{1.73_m2} — ABNORMAL LOW (ref >59)
GFR calc non Af Amer: 22 mL/min/{1.73_m2} — ABNORMAL LOW (ref >59)
Glucose: 119 mg/dL — ABNORMAL HIGH (ref 65–99)
Phosphorus: 4 mg/dL (ref 2.5–4.5)
Potassium: 3.7 mmol/L (ref 3.5–5.2)
Sodium: 137 mmol/L (ref 134–144)

## 2016-12-16 LAB — IRON AND TIBC
Iron Saturation: 13 % — ABNORMAL LOW (ref 15–55)
Iron: 39 ug/dL (ref 38–169)
Total Iron Binding Capacity: 294 ug/dL (ref 250–450)
UIBC: 255 ug/dL (ref 111–343)

## 2016-12-16 LAB — VITAMIN D 25 HYDROXY (VIT D DEFICIENCY, FRACTURES): Vit D, 25-Hydroxy: 16.1 ng/mL — ABNORMAL LOW (ref 30.0–100.0)

## 2016-12-16 LAB — VITAMIN B12: Vitamin B-12: 605 pg/mL (ref 232–1245)

## 2016-12-16 LAB — CBC
Hematocrit: 27.4 % — ABNORMAL LOW (ref 37.5–51.0)
Hemoglobin: 8.8 g/dL — ABNORMAL LOW (ref 13.0–17.7)
MCH: 28.6 pg (ref 26.6–33.0)
MCHC: 32.1 g/dL (ref 31.5–35.7)
MCV: 89 fL (ref 79–97)
Platelets: 298 10*3/uL (ref 150–379)
RBC: 3.08 x10E6/uL — ABNORMAL LOW (ref 4.14–5.80)
RDW: 17.3 % — ABNORMAL HIGH (ref 12.3–15.4)
WBC: 11.8 10*3/uL — ABNORMAL HIGH (ref 3.4–10.8)

## 2016-12-16 LAB — FOLATE: Folate: 9.1 ng/mL (ref >3.0)

## 2016-12-16 LAB — RETICULOCYTES: Retic Ct Pct: 2.3 % (ref 0.6–2.6)

## 2016-12-17 ENCOUNTER — Other Ambulatory Visit: Payer: Self-pay | Admitting: *Deleted

## 2016-12-17 DIAGNOSIS — I129 Hypertensive chronic kidney disease with stage 1 through stage 4 chronic kidney disease, or unspecified chronic kidney disease: Secondary | ICD-10-CM | POA: Diagnosis not present

## 2016-12-17 DIAGNOSIS — N183 Chronic kidney disease, stage 3 (moderate): Secondary | ICD-10-CM | POA: Diagnosis not present

## 2016-12-17 DIAGNOSIS — R238 Other skin changes: Secondary | ICD-10-CM | POA: Diagnosis not present

## 2016-12-17 DIAGNOSIS — L97821 Non-pressure chronic ulcer of other part of left lower leg limited to breakdown of skin: Secondary | ICD-10-CM | POA: Diagnosis not present

## 2016-12-17 DIAGNOSIS — M6282 Rhabdomyolysis: Secondary | ICD-10-CM | POA: Diagnosis not present

## 2016-12-17 DIAGNOSIS — I872 Venous insufficiency (chronic) (peripheral): Secondary | ICD-10-CM | POA: Diagnosis not present

## 2016-12-17 NOTE — Patient Outreach (Signed)
Successful telephone outreach to Vincent Mason, 81 year old male- ongoing follow up for transition of care/recent hospitalization July 11-16, 2018 for Acute Kidney injury,blood in stool,Anemia due to chronic blood loss.   Spoke with pt, HIPAA identifiers provided, discussed purpose of call- ongoing transition of care. Pt reports saw PCP, Heart MD, Kidney MD - everything fine.  RN CM discussed with pt referral from Heart MD to follow up with GI MD (for anemia) to which pt reports already saw GI- everything fine, no more bleeding.  RN CM discussed recent medication changes by Heart MD- pt confirmed potassium stopped/fluid being managed by medication/ swelling gone down,have a little.  Pt reports HH RN is coming three times a week for dressing changes.  Pt reports feeling stronger.     Plan:  As discussed with pt, plan to continue to follow for transition of care, follow up again next week telephonically/discuss then scheduling home visit.    [name]   [facility]  [phone]

## 2016-12-18 DIAGNOSIS — R238 Other skin changes: Secondary | ICD-10-CM | POA: Diagnosis not present

## 2016-12-18 DIAGNOSIS — I129 Hypertensive chronic kidney disease with stage 1 through stage 4 chronic kidney disease, or unspecified chronic kidney disease: Secondary | ICD-10-CM | POA: Diagnosis not present

## 2016-12-18 DIAGNOSIS — L97821 Non-pressure chronic ulcer of other part of left lower leg limited to breakdown of skin: Secondary | ICD-10-CM | POA: Diagnosis not present

## 2016-12-18 DIAGNOSIS — I872 Venous insufficiency (chronic) (peripheral): Secondary | ICD-10-CM | POA: Diagnosis not present

## 2016-12-18 DIAGNOSIS — M6282 Rhabdomyolysis: Secondary | ICD-10-CM | POA: Diagnosis not present

## 2016-12-18 DIAGNOSIS — N183 Chronic kidney disease, stage 3 (moderate): Secondary | ICD-10-CM | POA: Diagnosis not present

## 2016-12-21 DIAGNOSIS — I071 Rheumatic tricuspid insufficiency: Secondary | ICD-10-CM | POA: Diagnosis not present

## 2016-12-21 DIAGNOSIS — F458 Other somatoform disorders: Secondary | ICD-10-CM | POA: Diagnosis not present

## 2016-12-21 DIAGNOSIS — I10 Essential (primary) hypertension: Secondary | ICD-10-CM | POA: Diagnosis not present

## 2016-12-21 DIAGNOSIS — E782 Mixed hyperlipidemia: Secondary | ICD-10-CM | POA: Diagnosis not present

## 2016-12-21 DIAGNOSIS — I25810 Atherosclerosis of coronary artery bypass graft(s) without angina pectoris: Secondary | ICD-10-CM | POA: Diagnosis not present

## 2016-12-21 DIAGNOSIS — N183 Chronic kidney disease, stage 3 (moderate): Secondary | ICD-10-CM | POA: Diagnosis not present

## 2016-12-21 DIAGNOSIS — I872 Venous insufficiency (chronic) (peripheral): Secondary | ICD-10-CM | POA: Diagnosis not present

## 2016-12-21 DIAGNOSIS — I482 Chronic atrial fibrillation: Secondary | ICD-10-CM | POA: Diagnosis not present

## 2016-12-21 DIAGNOSIS — I129 Hypertensive chronic kidney disease with stage 1 through stage 4 chronic kidney disease, or unspecified chronic kidney disease: Secondary | ICD-10-CM | POA: Diagnosis not present

## 2016-12-21 DIAGNOSIS — M6282 Rhabdomyolysis: Secondary | ICD-10-CM | POA: Diagnosis not present

## 2016-12-21 DIAGNOSIS — R238 Other skin changes: Secondary | ICD-10-CM | POA: Diagnosis not present

## 2016-12-21 DIAGNOSIS — L97821 Non-pressure chronic ulcer of other part of left lower leg limited to breakdown of skin: Secondary | ICD-10-CM | POA: Diagnosis not present

## 2016-12-21 DIAGNOSIS — R0602 Shortness of breath: Secondary | ICD-10-CM | POA: Diagnosis not present

## 2016-12-23 DIAGNOSIS — N183 Chronic kidney disease, stage 3 (moderate): Secondary | ICD-10-CM | POA: Diagnosis not present

## 2016-12-23 DIAGNOSIS — R601 Generalized edema: Secondary | ICD-10-CM | POA: Diagnosis not present

## 2016-12-23 DIAGNOSIS — E876 Hypokalemia: Secondary | ICD-10-CM | POA: Diagnosis not present

## 2016-12-23 DIAGNOSIS — I129 Hypertensive chronic kidney disease with stage 1 through stage 4 chronic kidney disease, or unspecified chronic kidney disease: Secondary | ICD-10-CM | POA: Diagnosis not present

## 2016-12-23 DIAGNOSIS — I872 Venous insufficiency (chronic) (peripheral): Secondary | ICD-10-CM | POA: Diagnosis not present

## 2016-12-23 DIAGNOSIS — I272 Pulmonary hypertension, unspecified: Secondary | ICD-10-CM | POA: Diagnosis not present

## 2016-12-23 DIAGNOSIS — R238 Other skin changes: Secondary | ICD-10-CM | POA: Diagnosis not present

## 2016-12-23 DIAGNOSIS — M6282 Rhabdomyolysis: Secondary | ICD-10-CM | POA: Diagnosis not present

## 2016-12-23 DIAGNOSIS — L97821 Non-pressure chronic ulcer of other part of left lower leg limited to breakdown of skin: Secondary | ICD-10-CM | POA: Diagnosis not present

## 2016-12-24 ENCOUNTER — Other Ambulatory Visit: Payer: Self-pay | Admitting: *Deleted

## 2016-12-24 DIAGNOSIS — N183 Chronic kidney disease, stage 3 (moderate): Secondary | ICD-10-CM | POA: Diagnosis not present

## 2016-12-24 DIAGNOSIS — I129 Hypertensive chronic kidney disease with stage 1 through stage 4 chronic kidney disease, or unspecified chronic kidney disease: Secondary | ICD-10-CM | POA: Diagnosis not present

## 2016-12-24 DIAGNOSIS — I872 Venous insufficiency (chronic) (peripheral): Secondary | ICD-10-CM | POA: Diagnosis not present

## 2016-12-24 DIAGNOSIS — L97821 Non-pressure chronic ulcer of other part of left lower leg limited to breakdown of skin: Secondary | ICD-10-CM | POA: Diagnosis not present

## 2016-12-24 DIAGNOSIS — R238 Other skin changes: Secondary | ICD-10-CM | POA: Diagnosis not present

## 2016-12-24 DIAGNOSIS — M6282 Rhabdomyolysis: Secondary | ICD-10-CM | POA: Diagnosis not present

## 2016-12-24 NOTE — Patient Outreach (Signed)
Successful telephone encounter to Vincent Mason, 81 year old male- part of ongoing transition of care/follow up on recent hospitalization July 11-16,2018 for Acute Kidney injury,blood in stool,Anemian due to chronic blood loss.  Pt's history includes but not limited to CAD, Chronic kidney disease stage III, COPD,Hypertension.   Spoke with pt, HIPAA identifiers provided, discussed purpose of call- ongoing transition of care.  Pt reports weight has gone out of his legs, lost weight today 191 lbs, ranges 190-191 lbs.   Pt reports no sob, edema, chest pain, saw Kidney MD yesterday- everything good, to see Heart MD in 1-2 weeks.   RN CM discussed with pt doing a home visit , part of transition of care program to which reports currently have 2 people coming in ,one to do physical therapy and one to wrap his legs (RN).  Discussed with pt difference between Home health and [place], can have both services at the same time to which pt agreed to home visit.     Plan:  As discussed with pt,plan to follow up again next week- initial home visit.   [name]   [facility]  [phone]

## 2016-12-25 DIAGNOSIS — I872 Venous insufficiency (chronic) (peripheral): Secondary | ICD-10-CM | POA: Diagnosis not present

## 2016-12-25 DIAGNOSIS — M6282 Rhabdomyolysis: Secondary | ICD-10-CM | POA: Diagnosis not present

## 2016-12-25 DIAGNOSIS — L97821 Non-pressure chronic ulcer of other part of left lower leg limited to breakdown of skin: Secondary | ICD-10-CM | POA: Diagnosis not present

## 2016-12-25 DIAGNOSIS — I129 Hypertensive chronic kidney disease with stage 1 through stage 4 chronic kidney disease, or unspecified chronic kidney disease: Secondary | ICD-10-CM | POA: Diagnosis not present

## 2016-12-25 DIAGNOSIS — R238 Other skin changes: Secondary | ICD-10-CM | POA: Diagnosis not present

## 2016-12-25 DIAGNOSIS — N183 Chronic kidney disease, stage 3 (moderate): Secondary | ICD-10-CM | POA: Diagnosis not present

## 2016-12-28 DIAGNOSIS — M6282 Rhabdomyolysis: Secondary | ICD-10-CM | POA: Diagnosis not present

## 2016-12-28 DIAGNOSIS — I872 Venous insufficiency (chronic) (peripheral): Secondary | ICD-10-CM | POA: Diagnosis not present

## 2016-12-28 DIAGNOSIS — R238 Other skin changes: Secondary | ICD-10-CM | POA: Diagnosis not present

## 2016-12-28 DIAGNOSIS — I129 Hypertensive chronic kidney disease with stage 1 through stage 4 chronic kidney disease, or unspecified chronic kidney disease: Secondary | ICD-10-CM | POA: Diagnosis not present

## 2016-12-28 DIAGNOSIS — N183 Chronic kidney disease, stage 3 (moderate): Secondary | ICD-10-CM | POA: Diagnosis not present

## 2016-12-28 DIAGNOSIS — L97821 Non-pressure chronic ulcer of other part of left lower leg limited to breakdown of skin: Secondary | ICD-10-CM | POA: Diagnosis not present

## 2016-12-29 DIAGNOSIS — N183 Chronic kidney disease, stage 3 (moderate): Secondary | ICD-10-CM | POA: Diagnosis not present

## 2016-12-29 DIAGNOSIS — L97821 Non-pressure chronic ulcer of other part of left lower leg limited to breakdown of skin: Secondary | ICD-10-CM | POA: Diagnosis not present

## 2016-12-29 DIAGNOSIS — I872 Venous insufficiency (chronic) (peripheral): Secondary | ICD-10-CM | POA: Diagnosis not present

## 2016-12-29 DIAGNOSIS — I129 Hypertensive chronic kidney disease with stage 1 through stage 4 chronic kidney disease, or unspecified chronic kidney disease: Secondary | ICD-10-CM | POA: Diagnosis not present

## 2016-12-29 DIAGNOSIS — R238 Other skin changes: Secondary | ICD-10-CM | POA: Diagnosis not present

## 2016-12-29 DIAGNOSIS — M6282 Rhabdomyolysis: Secondary | ICD-10-CM | POA: Diagnosis not present

## 2016-12-30 DIAGNOSIS — L97821 Non-pressure chronic ulcer of other part of left lower leg limited to breakdown of skin: Secondary | ICD-10-CM | POA: Diagnosis not present

## 2016-12-30 DIAGNOSIS — M6282 Rhabdomyolysis: Secondary | ICD-10-CM | POA: Diagnosis not present

## 2016-12-30 DIAGNOSIS — N183 Chronic kidney disease, stage 3 (moderate): Secondary | ICD-10-CM | POA: Diagnosis not present

## 2016-12-30 DIAGNOSIS — R238 Other skin changes: Secondary | ICD-10-CM | POA: Diagnosis not present

## 2016-12-30 DIAGNOSIS — I129 Hypertensive chronic kidney disease with stage 1 through stage 4 chronic kidney disease, or unspecified chronic kidney disease: Secondary | ICD-10-CM | POA: Diagnosis not present

## 2016-12-30 DIAGNOSIS — I872 Venous insufficiency (chronic) (peripheral): Secondary | ICD-10-CM | POA: Diagnosis not present

## 2016-12-31 ENCOUNTER — Other Ambulatory Visit: Payer: Self-pay | Admitting: *Deleted

## 2016-12-31 ENCOUNTER — Encounter: Payer: Self-pay | Admitting: *Deleted

## 2016-12-31 DIAGNOSIS — M6282 Rhabdomyolysis: Secondary | ICD-10-CM | POA: Diagnosis not present

## 2016-12-31 DIAGNOSIS — L97821 Non-pressure chronic ulcer of other part of left lower leg limited to breakdown of skin: Secondary | ICD-10-CM | POA: Diagnosis not present

## 2016-12-31 DIAGNOSIS — I872 Venous insufficiency (chronic) (peripheral): Secondary | ICD-10-CM | POA: Diagnosis not present

## 2016-12-31 DIAGNOSIS — N183 Chronic kidney disease, stage 3 (moderate): Secondary | ICD-10-CM | POA: Diagnosis not present

## 2016-12-31 DIAGNOSIS — I129 Hypertensive chronic kidney disease with stage 1 through stage 4 chronic kidney disease, or unspecified chronic kidney disease: Secondary | ICD-10-CM | POA: Diagnosis not present

## 2016-12-31 DIAGNOSIS — R238 Other skin changes: Secondary | ICD-10-CM | POA: Diagnosis not present

## 2016-12-31 NOTE — Patient Outreach (Signed)
[facility]) Care Management   12/31/2016  Vincent Mason 1936/04/12 [id number]  Vincent Mason is an 81 y.o. male  Subjective:  Pt reports dealing with dizziness, MD questions drugs taking, called MD office ,nurse knows.   Pt reports to see Kidney MD tomorrow.  Pt reports [employer] RN to come today  Change leg dressings, swelling gone done, was told possibly can discontinue the dressing  Next week.   Objective:   Vitals:   12/31/16 1155  BP: (!) 92/58  Pulse: 73  Resp: 16  SpO2: 97%    ROS  Physical Exam  Constitutional: He is oriented to person, place, and time. He appears well-developed and well-nourished.  Musculoskeletal: Normal range of motion. He exhibits edema.  Trace edema to top of both feet.   Neurological: He is alert and oriented to person, place, and time.  Skin: Skin is warm.  Abrasion to right knee- result of fall yesterday  Psychiatric: He has a normal mood and affect. His behavior is normal. Judgment and thought content normal.    Encounter Medications:  Reviewed with spouse  Outpatient Encounter Prescriptions as of 12/31/2016  Medication Sig  . ferrous sulfate 325 (65 FE) MG tablet Take 1 tablet (325 mg total) by mouth 2 (two) times daily with a meal.  . methocarbamol (ROBAXIN) 750 MG tablet Take 750 mg by mouth at bedtime as needed for muscle spasms.  . metoprolol succinate (TOPROL-XL) 100 MG 24 hr tablet Take 100 mg by mouth daily.  Marland Kitchen oxyCODONE (ROXICODONE) 15 MG immediate release tablet Take 1 tablet (15 mg total) by mouth every 6 (six) hours as needed for pain.  . polyethylene glycol (MIRALAX / GLYCOLAX) packet Take 17 g by mouth daily.  . potassium chloride SA (K-DUR,KLOR-CON) 20 MEQ tablet Take 1 tablet (20 mEq total) by mouth 2 (two) times daily. (Patient not taking: Reported on 12/15/2016)  . pramipexole (MIRAPEX) 0.25 MG tablet TAKE ONE TO TWO TABLETS BY MOUTH AT BEDTIME AS NEEDED FOR RESTLESS LEG  . spironolactone (ALDACTONE) 25 MG tablet  Take 25 mg by mouth daily.  Marland Kitchen torsemide (DEMADEX) 20 MG tablet Take 2 tablets (40 mg total) by mouth 2 (two) times daily.  Marland Kitchen ULORIC 80 MG TABS TAKE ONE (1) TABLET EACH DAY   No facility-administered encounter medications on file as of 12/31/2016.     Functional Status:   In your present state of health, do you have any difficulty performing the following activities: 12/02/2016 10/14/2016  Hearing? N Y  Vision? N N  Difficulty concentrating or making decisions? N N  Walking or climbing stairs? Y Y  Dressing or bathing? Y N  Doing errands, shopping? Y N  Preparing Food and eating ? - N  Using the Toilet? - N  In the past six months, have you accidently leaked urine? - Y  Comment - due to diuretic  Do you have problems with loss of bowel control? - N  Managing your Medications? - N  Managing your Finances? - N  Housekeeping or managing your Housekeeping? - N  Some recent data might be hidden    Fall/Depression Screening:    Fall Risk  10/14/2016  Falls in the past year? Yes  Number falls in past yr: 1  Injury with Fall? No  Follow up Falls prevention discussed   PHQ 2/9 Scores 10/14/2016 10/14/2016  PHQ - 2 Score 0 0  PHQ- 9 Score 1 -    Assessment:  Pleasant 81 year  old gentleman, resides with spouse/very supportive, does weekly      Pill planner.   HH RN came during home visit, changed pt's Unaboot dressings on bilateral       Lower extremities.  Per [facility] RN- bilateral lower extremities healing, no drainage.     Skin integrity:  Bilateral lower extremities improving per [facility] RN, trace edema top of both feet.               No c/o pain.  Abrasion noted on    Hypotension:  BP today 92/58, pt reported gets shaky when getting up, had pt changed positions              Several times- Observed nor reported dizziness.   Reinforced importance of hydration.    Plan: As discussed with pt, plan to continue to follow for transition of care, follow up again next            Week telephonically.             Plan to send Dr. Caryn Section 12/31/16 home visit encounter.   THN CM Care Plan Problem One     Most Recent Value  Care Plan Problem One  Risk for readmission related to recent hospitalization for acute kidney injury, anemia- chronic blood loss   Role Documenting the Problem One  Care Management [employer] for Problem One  Active  THN Long Term Goal   Pt would not readmit to the hospital within the next 31 days   THN Long Term Goal Start Date  12/09/16  Interventions for Problem One Long Term Goal  initial home visit done- discussed hydration (BP low today), bilateral lower legs improving/HH RN coming twice a week to change unaboot dressing   THN CM Short Term Goal #1   Pt would keep all MD appointments in the next 30 days   THN CM Short Term Goal #1 Start Date  12/09/16  Interventions for Short Term Goal #1  Reviewed with pt upcoming MD appointments - to see PCP 01/04/17   THN CM Short Term Goal #2   Pt would take all medications as ordered for the next 30 days   THN CM Short Term Goal #2 Start Date  12/09/16  Interventions for Short Term Goal #2  Medications reviewed with spouse (does pill planner), med changes      [name]   [facility]  [phone]

## 2017-01-01 ENCOUNTER — Telehealth: Payer: Self-pay | Admitting: Family Medicine

## 2017-01-01 ENCOUNTER — Other Ambulatory Visit: Payer: Self-pay | Admitting: Family Medicine

## 2017-01-01 NOTE — Telephone Encounter (Signed)
Lisa with [facility] advised pt has had a fall outside of the home.  Pt tried to step on a curb and missed his step and fell.  Pt skinned his knee.  Pt is using neosporin and a band aid on his knee.  OU#[phone]/XA

## 2017-01-01 NOTE — Telephone Encounter (Signed)
Please review. Just an FYI.

## 2017-01-04 DIAGNOSIS — R42 Dizziness and giddiness: Secondary | ICD-10-CM | POA: Diagnosis not present

## 2017-01-04 DIAGNOSIS — R600 Localized edema: Secondary | ICD-10-CM | POA: Diagnosis not present

## 2017-01-04 DIAGNOSIS — D649 Anemia, unspecified: Secondary | ICD-10-CM | POA: Diagnosis not present

## 2017-01-04 DIAGNOSIS — F458 Other somatoform disorders: Secondary | ICD-10-CM | POA: Diagnosis not present

## 2017-01-04 DIAGNOSIS — I10 Essential (primary) hypertension: Secondary | ICD-10-CM | POA: Diagnosis not present

## 2017-01-04 DIAGNOSIS — I482 Chronic atrial fibrillation: Secondary | ICD-10-CM | POA: Diagnosis not present

## 2017-01-04 DIAGNOSIS — I739 Peripheral vascular disease, unspecified: Secondary | ICD-10-CM | POA: Diagnosis not present

## 2017-01-04 DIAGNOSIS — I25810 Atherosclerosis of coronary artery bypass graft(s) without angina pectoris: Secondary | ICD-10-CM | POA: Diagnosis not present

## 2017-01-05 ENCOUNTER — Ambulatory Visit (INDEPENDENT_AMBULATORY_CARE_PROVIDER_SITE_OTHER): Payer: Medicare Other | Admitting: Vascular Surgery

## 2017-01-05 ENCOUNTER — Encounter (INDEPENDENT_AMBULATORY_CARE_PROVIDER_SITE_OTHER): Payer: Medicare Other

## 2017-01-05 DIAGNOSIS — N183 Chronic kidney disease, stage 3 (moderate): Secondary | ICD-10-CM | POA: Diagnosis not present

## 2017-01-05 DIAGNOSIS — N184 Chronic kidney disease, stage 4 (severe): Secondary | ICD-10-CM | POA: Diagnosis not present

## 2017-01-05 DIAGNOSIS — M6282 Rhabdomyolysis: Secondary | ICD-10-CM | POA: Diagnosis not present

## 2017-01-05 DIAGNOSIS — I272 Pulmonary hypertension, unspecified: Secondary | ICD-10-CM | POA: Diagnosis not present

## 2017-01-05 DIAGNOSIS — L97821 Non-pressure chronic ulcer of other part of left lower leg limited to breakdown of skin: Secondary | ICD-10-CM | POA: Diagnosis not present

## 2017-01-05 DIAGNOSIS — I129 Hypertensive chronic kidney disease with stage 1 through stage 4 chronic kidney disease, or unspecified chronic kidney disease: Secondary | ICD-10-CM | POA: Diagnosis not present

## 2017-01-05 DIAGNOSIS — I872 Venous insufficiency (chronic) (peripheral): Secondary | ICD-10-CM | POA: Diagnosis not present

## 2017-01-05 DIAGNOSIS — R238 Other skin changes: Secondary | ICD-10-CM | POA: Diagnosis not present

## 2017-01-05 DIAGNOSIS — E876 Hypokalemia: Secondary | ICD-10-CM | POA: Diagnosis not present

## 2017-01-06 ENCOUNTER — Other Ambulatory Visit: Payer: Self-pay | Admitting: *Deleted

## 2017-01-06 NOTE — Patient Outreach (Signed)
Unsuccessful telephone encounter to Vincent Mason, 81 year old male for ongoing  transition of care/recent hospitalization July 11-16,2018 for acute kidney injury,anemia due to chronic blood loss.  HIPAA compliant voice message left with contact name and number.    Plan:  If no response to voice message left, plan to follow up again  next 2 days.    [name]   [facility]  [phone]

## 2017-01-06 NOTE — Patient Outreach (Signed)
Received a return phone call from [name] (pt's spouse, on consent) to voice message left earlier by RN CM, HIPAA identifiers verified on pt.   Spouse had pt get on the  phone, HIPAA identifiers verified by pt, discussed purpose of call- ongoing transition of care - recent hospitalization July 11-16,2018 for acute kidney injury, anemia due to chronic blood loss.  Pt reports everything is fine, saw Kidney MD and Heart MD.  RN CM inquired about his dizziness to which pt reports MD did away with 2 of his pills (Torsemide and Spirolactone), see if that helps.  Pt reports BP is fine.   Pt reports abrasion on knee is healed, took a shower yesterday and legs look good but HH RN wants to come one more time.     Plan: As discussed with pt, plan to follow up again next week telephonically.    [name]   [facility]  [phone]

## 2017-01-08 ENCOUNTER — Telehealth: Payer: Self-pay | Admitting: Family Medicine

## 2017-01-08 ENCOUNTER — Ambulatory Visit: Payer: Self-pay | Admitting: *Deleted

## 2017-01-08 DIAGNOSIS — R238 Other skin changes: Secondary | ICD-10-CM | POA: Diagnosis not present

## 2017-01-08 DIAGNOSIS — I872 Venous insufficiency (chronic) (peripheral): Secondary | ICD-10-CM | POA: Diagnosis not present

## 2017-01-08 DIAGNOSIS — L97821 Non-pressure chronic ulcer of other part of left lower leg limited to breakdown of skin: Secondary | ICD-10-CM | POA: Diagnosis not present

## 2017-01-08 DIAGNOSIS — N183 Chronic kidney disease, stage 3 (moderate): Secondary | ICD-10-CM | POA: Diagnosis not present

## 2017-01-08 DIAGNOSIS — M6282 Rhabdomyolysis: Secondary | ICD-10-CM | POA: Diagnosis not present

## 2017-01-08 DIAGNOSIS — I129 Hypertensive chronic kidney disease with stage 1 through stage 4 chronic kidney disease, or unspecified chronic kidney disease: Secondary | ICD-10-CM | POA: Diagnosis not present

## 2017-01-08 NOTE — Telephone Encounter (Signed)
OK to stop wrapping legs if they are healed.  Please let HH know. Thanks!  Virginia Crews, MD, MPH [facility] 01/08/2017 4:17 PM

## 2017-01-08 NOTE — Telephone Encounter (Signed)
Could you review this for Dr Caryn Section, he will be out of the office till Tuesday 01/12/17-aa

## 2017-01-08 NOTE — Telephone Encounter (Signed)
Vincent Mason with [facility] is requesting a verbal order to discontinue wraps on both of pt legs.  The wounds on both legs have healed and there is no swelling.  PZ#[phone]/SY

## 2017-01-08 NOTE — Telephone Encounter (Signed)
Lattie Haw with [facility] advised-aa

## 2017-01-11 ENCOUNTER — Other Ambulatory Visit: Payer: Self-pay | Admitting: Family Medicine

## 2017-01-11 ENCOUNTER — Other Ambulatory Visit: Payer: Self-pay | Admitting: *Deleted

## 2017-01-11 DIAGNOSIS — I872 Venous insufficiency (chronic) (peripheral): Secondary | ICD-10-CM | POA: Diagnosis not present

## 2017-01-11 DIAGNOSIS — M6282 Rhabdomyolysis: Secondary | ICD-10-CM | POA: Diagnosis not present

## 2017-01-11 DIAGNOSIS — L97821 Non-pressure chronic ulcer of other part of left lower leg limited to breakdown of skin: Secondary | ICD-10-CM | POA: Diagnosis not present

## 2017-01-11 DIAGNOSIS — I129 Hypertensive chronic kidney disease with stage 1 through stage 4 chronic kidney disease, or unspecified chronic kidney disease: Secondary | ICD-10-CM | POA: Diagnosis not present

## 2017-01-11 DIAGNOSIS — R238 Other skin changes: Secondary | ICD-10-CM | POA: Diagnosis not present

## 2017-01-11 DIAGNOSIS — N183 Chronic kidney disease, stage 3 (moderate): Secondary | ICD-10-CM | POA: Diagnosis not present

## 2017-01-11 NOTE — Telephone Encounter (Signed)
Pt contacted office for refill request on the following medications: ° °oxyCODONE (ROXICODONE) 15 MG immediate release tablet ° °CB#[phone]/MW °

## 2017-01-11 NOTE — Patient Outreach (Signed)
Successful telephone encounter to Vincent Mason, 81 year old male for transition of care (final call), ongoing follow up on recent hospitalization July 11-16,2018 for acute kidney injury,anemia due to chronic blood loss.   Spoke with pt, HIPAA identifiers verified, inquired about status of bilateral lower extremities to which pt reports laid off one day (applying wraps), trying to expand now- little swelling. Pt reports HH RN is her now,going to wrap legs again.   Pt reports stopped his diuretics, dizziness not any better, thinks will have to go back on one of the them, plan to call MD.   Pt reports BP is better, today as reported to pt by [facility] RN - 110/70.   RN CM discussed with pt today final transition of care call, plan to follow up again next month telephonically- check on clinical status, confirmed pt had RN CM's contact number to call if needed.    Plan:  As discussed with pt, plan to follow up again next month telephonically.           Transition of care program completed, care plan updated.   [name]   [place] [facility]  [phone]

## 2017-01-12 ENCOUNTER — Telehealth: Payer: Self-pay

## 2017-01-12 DIAGNOSIS — R238 Other skin changes: Secondary | ICD-10-CM | POA: Diagnosis not present

## 2017-01-12 DIAGNOSIS — N183 Chronic kidney disease, stage 3 (moderate): Secondary | ICD-10-CM | POA: Diagnosis not present

## 2017-01-12 DIAGNOSIS — I129 Hypertensive chronic kidney disease with stage 1 through stage 4 chronic kidney disease, or unspecified chronic kidney disease: Secondary | ICD-10-CM | POA: Diagnosis not present

## 2017-01-12 DIAGNOSIS — I872 Venous insufficiency (chronic) (peripheral): Secondary | ICD-10-CM | POA: Diagnosis not present

## 2017-01-12 DIAGNOSIS — M6282 Rhabdomyolysis: Secondary | ICD-10-CM | POA: Diagnosis not present

## 2017-01-12 DIAGNOSIS — L97821 Non-pressure chronic ulcer of other part of left lower leg limited to breakdown of skin: Secondary | ICD-10-CM | POA: Diagnosis not present

## 2017-01-12 NOTE — Telephone Encounter (Signed)
Lattie Haw with [facility] called stating that she recieved a verbal order on Friday 01/08/2017 to discontinue leg wraps since swelling had improved. She states she assessed the patient on Monday 01/11/2017 and placed an unna boot on his legs. Lattie Haw says that the swelling in his legs has returned and patient does not want any compression stockings of any sort on his legs. Lattie Haw wants to know if you wanted to put him back on a fluid pill? Lattie Haw says the Torsemide was discontinued by Urology per patient report. Lisa's call back is [phone].

## 2017-01-13 MED ORDER — OXYCODONE HCL 15 MG PO TABS: 15.0000 mg | ORAL_TABLET | Freq: Four times a day (QID) | ORAL | 0 refills | Status: DC | PRN

## 2017-01-14 DIAGNOSIS — L97821 Non-pressure chronic ulcer of other part of left lower leg limited to breakdown of skin: Secondary | ICD-10-CM | POA: Diagnosis not present

## 2017-01-14 DIAGNOSIS — R238 Other skin changes: Secondary | ICD-10-CM | POA: Diagnosis not present

## 2017-01-14 DIAGNOSIS — I129 Hypertensive chronic kidney disease with stage 1 through stage 4 chronic kidney disease, or unspecified chronic kidney disease: Secondary | ICD-10-CM | POA: Diagnosis not present

## 2017-01-14 DIAGNOSIS — M6282 Rhabdomyolysis: Secondary | ICD-10-CM | POA: Diagnosis not present

## 2017-01-14 DIAGNOSIS — I872 Venous insufficiency (chronic) (peripheral): Secondary | ICD-10-CM | POA: Diagnosis not present

## 2017-01-14 DIAGNOSIS — N183 Chronic kidney disease, stage 3 (moderate): Secondary | ICD-10-CM | POA: Diagnosis not present

## 2017-01-15 NOTE — Telephone Encounter (Signed)
He can take torsemide once or twice a day as needed, but he needs to be careful because this medication contributes to kidney failure. It is best to treat the swelling with compression stockings or unnaboots. Dr Keturah Barre recommendation was to only take torsemide when his weight exceeds 195 points.

## 2017-01-15 NOTE — Telephone Encounter (Signed)
Patient's wife [name] was notified. [name] said that pt is already taking 1/2 torsemide at night and another fluid  medication bid (she can't remember the name) in the morning. [name] stated that she is going to continue the same doses with pt unless his swelling worsens. LMOVM for Lattie Haw to return call.

## 2017-01-15 NOTE — Telephone Encounter (Signed)
No answer - will try again later

## 2017-01-18 DIAGNOSIS — I129 Hypertensive chronic kidney disease with stage 1 through stage 4 chronic kidney disease, or unspecified chronic kidney disease: Secondary | ICD-10-CM | POA: Diagnosis not present

## 2017-01-18 DIAGNOSIS — M6282 Rhabdomyolysis: Secondary | ICD-10-CM | POA: Diagnosis not present

## 2017-01-18 DIAGNOSIS — N183 Chronic kidney disease, stage 3 (moderate): Secondary | ICD-10-CM | POA: Diagnosis not present

## 2017-01-18 DIAGNOSIS — L97821 Non-pressure chronic ulcer of other part of left lower leg limited to breakdown of skin: Secondary | ICD-10-CM | POA: Diagnosis not present

## 2017-01-18 DIAGNOSIS — R238 Other skin changes: Secondary | ICD-10-CM | POA: Diagnosis not present

## 2017-01-18 DIAGNOSIS — I872 Venous insufficiency (chronic) (peripheral): Secondary | ICD-10-CM | POA: Diagnosis not present

## 2017-01-21 ENCOUNTER — Emergency Department: Payer: Medicare Other

## 2017-01-21 ENCOUNTER — Observation Stay
Admission: EM | Admit: 2017-01-21 | Discharge: 2017-01-22 | Disposition: A | Discharge status: Home / Self Care | Payer: Medicare Other | Attending: Internal Medicine | Admitting: Internal Medicine

## 2017-01-21 ENCOUNTER — Emergency Department
Admit: 2017-01-21 | Discharge: 2017-01-21 | Disposition: A | Discharge status: Home / Self Care | Payer: Medicare Other | Attending: Internal Medicine | Admitting: Internal Medicine

## 2017-01-21 ENCOUNTER — Encounter: Payer: Self-pay | Admitting: Emergency Medicine

## 2017-01-21 DIAGNOSIS — I129 Hypertensive chronic kidney disease with stage 1 through stage 4 chronic kidney disease, or unspecified chronic kidney disease: Secondary | ICD-10-CM | POA: Diagnosis not present

## 2017-01-21 DIAGNOSIS — G4733 Obstructive sleep apnea (adult) (pediatric): Secondary | ICD-10-CM | POA: Insufficient documentation

## 2017-01-21 DIAGNOSIS — R569 Unspecified convulsions: Secondary | ICD-10-CM | POA: Insufficient documentation

## 2017-01-21 DIAGNOSIS — J849 Interstitial pulmonary disease, unspecified: Secondary | ICD-10-CM | POA: Diagnosis not present

## 2017-01-21 DIAGNOSIS — R748 Abnormal levels of other serum enzymes: Secondary | ICD-10-CM | POA: Insufficient documentation

## 2017-01-21 DIAGNOSIS — R4182 Altered mental status, unspecified: Secondary | ICD-10-CM | POA: Diagnosis not present

## 2017-01-21 DIAGNOSIS — T40601A Poisoning by unspecified narcotics, accidental (unintentional), initial encounter: Secondary | ICD-10-CM | POA: Insufficient documentation

## 2017-01-21 DIAGNOSIS — E785 Hyperlipidemia, unspecified: Secondary | ICD-10-CM | POA: Diagnosis not present

## 2017-01-21 DIAGNOSIS — Z951 Presence of aortocoronary bypass graft: Secondary | ICD-10-CM | POA: Insufficient documentation

## 2017-01-21 DIAGNOSIS — I493 Ventricular premature depolarization: Secondary | ICD-10-CM | POA: Insufficient documentation

## 2017-01-21 DIAGNOSIS — Z9119 Patient's noncompliance with other medical treatment and regimen: Secondary | ICD-10-CM | POA: Insufficient documentation

## 2017-01-21 DIAGNOSIS — G92 Toxic encephalopathy: Secondary | ICD-10-CM | POA: Insufficient documentation

## 2017-01-21 DIAGNOSIS — R4020 Unspecified coma: Secondary | ICD-10-CM | POA: Diagnosis not present

## 2017-01-21 DIAGNOSIS — I739 Peripheral vascular disease, unspecified: Secondary | ICD-10-CM | POA: Diagnosis not present

## 2017-01-21 DIAGNOSIS — G919 Hydrocephalus, unspecified: Secondary | ICD-10-CM | POA: Insufficient documentation

## 2017-01-21 DIAGNOSIS — I482 Chronic atrial fibrillation: Secondary | ICD-10-CM | POA: Diagnosis not present

## 2017-01-21 DIAGNOSIS — I700 Atherosclerosis of aorta: Secondary | ICD-10-CM | POA: Diagnosis not present

## 2017-01-21 DIAGNOSIS — I071 Rheumatic tricuspid insufficiency: Secondary | ICD-10-CM | POA: Diagnosis not present

## 2017-01-21 DIAGNOSIS — T40604A Poisoning by unspecified narcotics, undetermined, initial encounter: Secondary | ICD-10-CM | POA: Diagnosis present

## 2017-01-21 DIAGNOSIS — I4510 Unspecified right bundle-branch block: Secondary | ICD-10-CM | POA: Insufficient documentation

## 2017-01-21 DIAGNOSIS — E041 Nontoxic single thyroid nodule: Secondary | ICD-10-CM | POA: Diagnosis not present

## 2017-01-21 DIAGNOSIS — Z87891 Personal history of nicotine dependence: Secondary | ICD-10-CM | POA: Insufficient documentation

## 2017-01-21 DIAGNOSIS — J449 Chronic obstructive pulmonary disease, unspecified: Secondary | ICD-10-CM | POA: Diagnosis not present

## 2017-01-21 DIAGNOSIS — I313 Pericardial effusion (noninflammatory): Secondary | ICD-10-CM | POA: Diagnosis not present

## 2017-01-21 DIAGNOSIS — N183 Chronic kidney disease, stage 3 (moderate): Secondary | ICD-10-CM | POA: Diagnosis not present

## 2017-01-21 DIAGNOSIS — D649 Anemia, unspecified: Secondary | ICD-10-CM | POA: Insufficient documentation

## 2017-01-21 DIAGNOSIS — T402X1A Poisoning by other opioids, accidental (unintentional), initial encounter: Principal | ICD-10-CM | POA: Insufficient documentation

## 2017-01-21 DIAGNOSIS — I10 Essential (primary) hypertension: Secondary | ICD-10-CM | POA: Diagnosis not present

## 2017-01-21 DIAGNOSIS — R55 Syncope and collapse: Secondary | ICD-10-CM | POA: Diagnosis not present

## 2017-01-21 DIAGNOSIS — Z86010 Personal history of colonic polyps: Secondary | ICD-10-CM | POA: Insufficient documentation

## 2017-01-21 DIAGNOSIS — T402X4A Poisoning by other opioids, undetermined, initial encounter: Secondary | ICD-10-CM | POA: Diagnosis not present

## 2017-01-21 DIAGNOSIS — K219 Gastro-esophageal reflux disease without esophagitis: Secondary | ICD-10-CM | POA: Diagnosis not present

## 2017-01-21 DIAGNOSIS — I672 Cerebral atherosclerosis: Secondary | ICD-10-CM | POA: Diagnosis not present

## 2017-01-21 DIAGNOSIS — T50904A Poisoning by unspecified drugs, medicaments and biological substances, undetermined, initial encounter: Secondary | ICD-10-CM | POA: Diagnosis not present

## 2017-01-21 LAB — URINE DRUG SCREEN, QUALITATIVE (ARMC ONLY)
Amphetamines, Ur Screen: NOT DETECTED
Barbiturates, Ur Screen: NOT DETECTED
Benzodiazepine, Ur Scrn: POSITIVE — AB
Cannabinoid 50 Ng, Ur ~~LOC~~: NOT DETECTED
Cocaine Metabolite,Ur ~~LOC~~: NOT DETECTED
MDMA (Ecstasy)Ur Screen: NOT DETECTED
Methadone Scn, Ur: NOT DETECTED
Opiate, Ur Screen: NOT DETECTED
Phencyclidine (PCP) Ur S: NOT DETECTED
Tricyclic, Ur Screen: NOT DETECTED

## 2017-01-21 LAB — TROPONIN I
Troponin I: <0.03 ng/mL (ref <0.03)
Troponin I: 0.71 ng/mL (ref <0.03)
Troponin I: 2.08 ng/mL (ref <0.03)

## 2017-01-21 LAB — COMPREHENSIVE METABOLIC PANEL
ALT: 11 U/L — ABNORMAL LOW (ref 17–63)
AST: 23 U/L (ref 15–41)
Albumin: 3.8 g/dL (ref 3.5–5.0)
Alkaline Phosphatase: 40 U/L (ref 38–126)
Anion gap: 12 (ref 5–15)
BUN: 32 mg/dL — ABNORMAL HIGH (ref 6–20)
CO2: 25 mmol/L (ref 22–32)
Calcium: 8.8 mg/dL — ABNORMAL LOW (ref 8.9–10.3)
Chloride: 101 mmol/L (ref 101–111)
Creatinine, Ser: 2.01 mg/dL — ABNORMAL HIGH (ref 0.61–1.24)
GFR calc Af Amer: 35 mL/min — ABNORMAL LOW (ref >60)
GFR calc non Af Amer: 30 mL/min — ABNORMAL LOW (ref >60)
Glucose, Bld: 130 mg/dL — ABNORMAL HIGH (ref 65–99)
Potassium: 3.7 mmol/L (ref 3.5–5.1)
Sodium: 138 mmol/L (ref 135–145)
Total Bilirubin: 0.7 mg/dL (ref 0.3–1.2)
Total Protein: 6.8 g/dL (ref 6.5–8.1)

## 2017-01-21 LAB — URINALYSIS, COMPLETE (UACMP) WITH MICROSCOPIC
Bacteria, UA: NONE SEEN
Bilirubin Urine: NEGATIVE
Glucose, UA: NEGATIVE mg/dL
Hgb urine dipstick: NEGATIVE
Ketones, ur: NEGATIVE mg/dL
Leukocytes, UA: NEGATIVE
Nitrite: NEGATIVE
Protein, ur: NEGATIVE mg/dL
Specific Gravity, Urine: 1.006 (ref 1.005–1.030)
Squamous Epithelial / LPF: NONE SEEN
pH: 7 (ref 5.0–8.0)

## 2017-01-21 LAB — CBC WITH DIFFERENTIAL/PLATELET
Basophils Absolute: 0 10*3/uL (ref 0–0.1)
Basophils Relative: 0 %
Eosinophils Absolute: 0.1 10*3/uL (ref 0–0.7)
Eosinophils Relative: 1 %
HCT: 32.8 % — ABNORMAL LOW (ref 40.0–52.0)
Hemoglobin: 10.8 g/dL — ABNORMAL LOW (ref 13.0–18.0)
Lymphocytes Relative: 10 %
Lymphs Abs: 0.7 10*3/uL — ABNORMAL LOW (ref 1.0–3.6)
MCH: 29.7 pg (ref 26.0–34.0)
MCHC: 33.1 g/dL (ref 32.0–36.0)
MCV: 89.7 fL (ref 80.0–100.0)
Monocytes Absolute: 0.6 10*3/uL (ref 0.2–1.0)
Monocytes Relative: 9 %
Neutro Abs: 5.5 10*3/uL (ref 1.4–6.5)
Neutrophils Relative %: 80 %
Platelets: 170 10*3/uL (ref 150–440)
RBC: 3.65 MIL/uL — ABNORMAL LOW (ref 4.40–5.90)
RDW: 19.6 % — ABNORMAL HIGH (ref 11.5–14.5)
WBC: 6.8 10*3/uL (ref 3.8–10.6)

## 2017-01-21 LAB — PROTIME-INR
INR: 1.02
Prothrombin Time: 13.3 seconds (ref 11.4–15.2)

## 2017-01-21 LAB — T4, FREE: Free T4: 0.38 ng/dL — ABNORMAL LOW (ref 0.61–1.12)

## 2017-01-21 LAB — SALICYLATE LEVEL: Salicylate Lvl: <7 mg/dL (ref 2.8–30.0)

## 2017-01-21 LAB — ETHANOL: Alcohol, Ethyl (B): <5 mg/dL (ref <5)

## 2017-01-21 LAB — GLUCOSE, CAPILLARY: Glucose-Capillary: 117 mg/dL — ABNORMAL HIGH (ref 65–99)

## 2017-01-21 LAB — LIPASE, BLOOD: Lipase: 24 U/L (ref 11–51)

## 2017-01-21 LAB — TSH: TSH: 66.236 u[IU]/mL — ABNORMAL HIGH (ref 0.350–4.500)

## 2017-01-21 LAB — AMMONIA: Ammonia: 34 umol/L (ref 9–35)

## 2017-01-21 LAB — APTT: aPTT: 25 seconds (ref 24–36)

## 2017-01-21 LAB — ACETAMINOPHEN LEVEL: Acetaminophen (Tylenol), Serum: <10 ug/mL — ABNORMAL LOW (ref 10–30)

## 2017-01-21 MED ORDER — LORAZEPAM 2 MG/ML IJ SOLN
INTRAMUSCULAR | Status: AC
  Administered 2017-01-21: 2 mg via INTRAVENOUS
  Filled 2017-01-21: qty 1

## 2017-01-21 MED ORDER — CHLORHEXIDINE GLUCONATE 0.12 % MT SOLN
15.0000 mL | Freq: Two times a day (BID) | OROMUCOSAL | Status: DC
  Administered 2017-01-22: 15 mL via OROMUCOSAL
  Filled 2017-01-21: qty 15

## 2017-01-21 MED ORDER — DIPHENHYDRAMINE HCL 50 MG/ML IJ SOLN
25.0000 mg | Freq: Once | INTRAMUSCULAR | Status: AC
  Administered 2017-01-21: 25 mg via INTRAMUSCULAR

## 2017-01-21 MED ORDER — HALOPERIDOL LACTATE 5 MG/ML IJ SOLN
INTRAMUSCULAR | Status: AC
  Administered 2017-01-21: 5 mg via INTRAMUSCULAR
  Filled 2017-01-21: qty 1

## 2017-01-21 MED ORDER — SODIUM CHLORIDE 0.9 % IV BOLUS (SEPSIS)
500.0000 mL | Freq: Once | INTRAVENOUS | Status: AC
  Administered 2017-01-21: 500 mL via INTRAVENOUS

## 2017-01-21 MED ORDER — LORAZEPAM 2 MG/ML IJ SOLN
2.0000 mg | Freq: Once | INTRAMUSCULAR | Status: AC
  Administered 2017-01-21: 2 mg via INTRAVENOUS

## 2017-01-21 MED ORDER — MIDAZOLAM HCL 5 MG/5ML IJ SOLN
2.0000 mg | Freq: Once | INTRAMUSCULAR | Status: DC
  Filled 2017-01-21: qty 5

## 2017-01-21 MED ORDER — ACETAMINOPHEN 650 MG RE SUPP: 650.0000 mg | Freq: Four times a day (QID) | RECTAL | Status: DC | PRN

## 2017-01-21 MED ORDER — ORAL CARE MOUTH RINSE
15.0000 mL | Freq: Two times a day (BID) | OROMUCOSAL | Status: DC
  Administered 2017-01-22: 15 mL via OROMUCOSAL

## 2017-01-21 MED ORDER — HALOPERIDOL LACTATE 5 MG/ML IJ SOLN
5.0000 mg | Freq: Once | INTRAMUSCULAR | Status: AC
  Administered 2017-01-21: 5 mg via INTRAMUSCULAR

## 2017-01-21 MED ORDER — LEVOTHYROXINE SODIUM 100 MCG IV SOLR
25.0000 ug | Freq: Once | INTRAVENOUS | Status: AC
  Administered 2017-01-21: 25 ug via INTRAVENOUS
  Filled 2017-01-21 (×3): qty 5

## 2017-01-21 MED ORDER — IOPAMIDOL (ISOVUE-370) INJECTION 76%
60.0000 mL | Freq: Once | INTRAVENOUS | Status: AC | PRN
  Administered 2017-01-21: 60 mL via INTRAVENOUS

## 2017-01-21 MED ORDER — FENTANYL CITRATE (PF) 100 MCG/2ML IJ SOLN
100.0000 ug | Freq: Once | INTRAMUSCULAR | Status: AC
  Administered 2017-01-21: 100 ug via INTRAVENOUS
  Filled 2017-01-21: qty 2

## 2017-01-21 MED ORDER — ONDANSETRON HCL 4 MG PO TABS: 4.0000 mg | ORAL_TABLET | Freq: Four times a day (QID) | ORAL | Status: DC | PRN

## 2017-01-21 MED ORDER — ONDANSETRON HCL 4 MG/2ML IJ SOLN: 4.0000 mg | Freq: Four times a day (QID) | INTRAMUSCULAR | Status: DC | PRN

## 2017-01-21 MED ORDER — DIPHENHYDRAMINE HCL 50 MG/ML IJ SOLN
INTRAMUSCULAR | Status: AC
  Administered 2017-01-21: 25 mg via INTRAMUSCULAR
  Filled 2017-01-21: qty 1

## 2017-01-21 MED ORDER — ACETAMINOPHEN 325 MG PO TABS: 650.0000 mg | ORAL_TABLET | Freq: Four times a day (QID) | ORAL | Status: DC | PRN

## 2017-01-21 MED ORDER — ENOXAPARIN SODIUM 30 MG/0.3ML ~~LOC~~ SOLN
30.0000 mg | SUBCUTANEOUS | Status: DC
  Administered 2017-01-21: 30 mg via SUBCUTANEOUS
  Filled 2017-01-21: qty 0.3

## 2017-01-21 NOTE — ED Notes (Signed)
Patient in bed with eyes open, complaining of pain in legs and states, "I can't get comfortable, my arms are tied down." This RN explained to patient that he had been attempting to hit staff upon his arrival and that was the reason for restraints. Patient verbalized understanding and reports that he will not attempt to hit staff again. Verbalized to patient that if he attempted to hit staff again, restraints would be replaced. Soft restraints removed from wrists at this time.

## 2017-01-21 NOTE — ED Notes (Signed)
Resumed care from [place] rn.  Pt sleeping  Sitter with pt.  nsr on monitor  Iv in place.  Pt waiting on admission.

## 2017-01-21 NOTE — Consult Note (Signed)
Referring Physician: Dineen Kid    Chief Complaint: AMS  HPI: Vincent Mason is an 81 y.o. male with multiple medical problems who is altered and unable to provide any history.  All history obtained from the wife. She reports that last evening he had some nausea and vomiting.  Did not speak to her afterward and went to bed.  This morning he would not respond to her.  He eventually yelled out and stiffened.  Since that time has been agitated and altered.  Does not follow commands.  Unable to speak intelligently.  EMS was called.   Patient with a history of atrial fibrillation on no antiplatelet or anticoagulation.    Date last known well: Date: 01/20/2017 Time last known well: Time: 20:00 tPA Given: No: Outside time window  Past Medical History:  Diagnosis Date  . Acute encephalopathy 09/03/2015  . Atrial fibrillation and flutter [facility] 2013   Dr Nehemiah Massed at [facility]  on [address].   . C. difficile colitis 09/12/2015  . COPD (chronic obstructive pulmonary disease) ([place])   . HCAP (healthcare-associated pneumonia) 09/12/2015  . Hyperlipidemia   . Hypertension   . Kidney disease, chronic, stage III (moderate, EGFR 30-59 ml/min)    ARF 2013  . OSA (obstructive sleep apnea)    non-compliant with CPAP.   Marland Kitchen Severe sepsis with septic shock ([place]) 09/12/2015    Past Surgical History:  Procedure Laterality Date  . CATARACT EXTRACTION    . CORONARY ARTERY BYPASS GRAFT  10/1997  . history of cervical discectomy  2009   C5-C6  . Hyperplastic colon polyp  02/2002   Sigmoid polyps    Family History  Problem Relation Age of Onset  . Hypertension Mother   . Hypertension Father    Social History:  reports that he has quit smoking. He has never used smokeless tobacco. He reports that he does not drink alcohol or use drugs.  Allergies:  Allergies  Allergen Reactions  . Decongestant  [Oxymetazoline]     Medications: I have reviewed the patient's current medications. Prior to Admission:  Prior to  Admission medications   Medication Sig Start Date End Date Taking? Authorizing Provider  ferrous sulfate 325 (65 FE) MG tablet Take 1 tablet (325 mg total) by mouth 2 (two) times daily with a meal. 12/07/16  Yes Gladstone Lighter, MD  methocarbamol (ROBAXIN) 750 MG tablet Take 750 mg by mouth at bedtime as needed for muscle spasms.   Yes [provider]  metolazone (ZAROXOLYN) 5 MG tablet Take 5 mg by mouth daily as needed (swelling).  12/17/16  Yes [provider]  metoprolol succinate (TOPROL-XL) 100 MG 24 hr tablet TAKE ONE-HALF TABLET DAILY 01/01/17  Yes Birdie Sons, MD  oxyCODONE (ROXICODONE) 15 MG immediate release tablet Take 1 tablet (15 mg total) by mouth every 6 (six) hours as needed for pain. 01/13/17  Yes Birdie Sons, MD  polyethylene glycol [facility] / GLYCOLAX) packet Take 17 g by mouth daily. 12/08/16  Yes Gladstone Lighter, MD  pramipexole (MIRAPEX) 0.25 MG tablet TAKE ONE TO TWO TABLETS BY MOUTH AT BEDTIME AS NEEDED FOR RESTLESS LEG 07/21/16  Yes Birdie Sons, MD  ranitidine (ZANTAC) 150 MG tablet Take 150 mg by mouth 2 (two) times daily. 12/18/16  Yes [provider]  spironolactone (ALDACTONE) 25 MG tablet Take 25 mg by mouth daily. 12/14/16  Yes [provider]  ULORIC 80 MG TABS TAKE ONE (1) TABLET EACH DAY 02/19/16  Yes Birdie Sons, MD  potassium chloride SA (K-DUR,KLOR-CON) 20 MEQ tablet Take 1 tablet (20 mEq total) by mouth 2 (two) times daily. Patient not taking: Reported on 12/15/2016 06/18/16   Birdie Sons, MD  torsemide (DEMADEX) 20 MG tablet Take 2 tablets (40 mg total) by mouth 2 (two) times daily. Patient not taking: Reported on 01/21/2017 12/07/16   Gladstone Lighter, MD    ROS: Patient unable to provide due to mental status  Physical Examination: Blood pressure (!) 152/74, pulse (!) 120, resp. rate (!) 28, height '5\' 10"'$  (1.778 m), weight 86.2 kg (190 lb), SpO2 97 %.  HEENT-  Normocephalic, no lesions, without  obvious abnormality.  Normal external eye and conjunctiva.  Normal TM's bilaterally.  Normal auditory canals and external ears. Normal external nose, mucus membranes and septum.  Normal pharynx. Cardiovascular- S1, S2 normal, pulses palpable throughout   Lungs- chest clear, no wheezing, rales, normal symmetric air entry Abdomen- soft, non-tender; bowel sounds normal; no masses,  no organomegaly Extremities- no edema Lymph-no adenopathy palpable Musculoskeletal-no joint tenderness, deformity or swelling Skin-warm and dry, no hyperpigmentation, vitiligo, or suspicious lesions  Neurological Examination   Mental Status: Patient does not respond to verbal stimuli.  Agitated.  Thrashing.  Does not follow commands.  Speech unintelligible. Cranial Nerves: II: Unclear if patient responds to confrontation bilaterally, pupils right 3 mm, left 3 mm,and reactive bilaterally III,IV,VI: doll's response present bilaterally.  V,VII: corneal reflex present bilaterally  VIII: patient does not respond to verbal stimuli IX,X: gag reflex unable to be tested, XI: trapezius strength unable to test bilaterally XII: tongue strength unable to test Motor: Moves all extremities strongly spontaneously.  No focal weakness noted.   Sensory: Responds to noxious stimuli throughout Deep Tendon Reflexes:  2+ throughout. Plantars: upgoing bilaterally Cerebellar: Unable to perform   Laboratory Studies:  Basic Metabolic Panel:  Recent Labs Lab 01/21/17 0948  NA 138  K 3.7  CL 101  CO2 25  GLUCOSE 130*  BUN 32*  CREATININE 2.01*  CALCIUM 8.8*    Liver Function Tests:  Recent Labs Lab 01/21/17 0948  AST 23  ALT 11*  ALKPHOS 40  BILITOT 0.7  PROT 6.8  ALBUMIN 3.8    Recent Labs Lab 01/21/17 0948  LIPASE 24    Recent Labs Lab 01/21/17 0949  AMMONIA 34    CBC:  Recent Labs Lab 01/21/17 0948  WBC 6.8  NEUTROABS 5.5  HGB 10.8*  HCT 32.8*  MCV 89.7  PLT 170    Cardiac  Enzymes:  Recent Labs Lab 01/21/17 0948  TROPONINI <0.03    BNP: Invalid input(s): POCBNP  CBG:  Recent Labs Lab 01/21/17 0938  GLUCAP 117*    Microbiology: Results for orders placed or performed during the hospital encounter of 12/02/16  Aerobic/Anaerobic Culture (surgical/deep wound)     Status: None   Collection Time: 12/02/16 12:53 PM  Result Value Ref Range Status   Specimen Description LEG LEFT  Final   Special Requests Normal  Final   Gram Stain   Final    RARE WBC PRESENT,BOTH PMN AND MONONUCLEAR ABUNDANT GRAM NEGATIVE RODS RARE GRAM POSITIVE COCCI IN PAIRS RARE GRAM VARIABLE ROD    Culture   Final    ABUNDANT PROVIDENCIA RETTGERI NO ANAEROBES ISOLATED    Report Status 12/07/2016 FINAL  Final   Organism ID, Bacteria PROVIDENCIA RETTGERI  Final      Susceptibility   Providencia rettgeri - MIC*    AMPICILLIN RESISTANT Resistant     CEFAZOLIN >=64  RESISTANT Resistant     CEFEPIME <=1 SENSITIVE Sensitive     CEFTAZIDIME <=1 SENSITIVE Sensitive     CEFTRIAXONE <=1 SENSITIVE Sensitive     CIPROFLOXACIN <=0.25 SENSITIVE Sensitive     GENTAMICIN <=1 SENSITIVE Sensitive     IMIPENEM 1 SENSITIVE Sensitive     TRIMETH/SULFA <=20 SENSITIVE Sensitive     AMPICILLIN/SULBACTAM 16 INTERMEDIATE Intermediate     PIP/TAZO <=4 SENSITIVE Sensitive     * ABUNDANT PROVIDENCIA RETTGERI  MRSA PCR Screening     Status: None   Collection Time: 12/04/16 12:30 AM  Result Value Ref Range Status   MRSA by PCR NEGATIVE NEGATIVE Final    Comment:        The GeneXpert MRSA Assay (FDA approved for NASAL specimens only), is one component of a comprehensive MRSA colonization surveillance program. It is not intended to diagnose MRSA infection nor to guide or monitor treatment for MRSA infections.     Coagulation Studies:  Recent Labs  01/21/17 0949  LABPROT 13.3  INR 1.02    Urinalysis:  Recent Labs Lab 01/21/17 0949  COLORURINE STRAW*  LABSPEC 1.006  PHURINE 7.0   GLUCOSEU NEGATIVE  HGBUR NEGATIVE  BILIRUBINUR NEGATIVE  KETONESUR NEGATIVE  PROTEINUR NEGATIVE  NITRITE NEGATIVE  LEUKOCYTESUR NEGATIVE    Lipid Panel:    Component Value Date/Time   CHOL 145 10/10/2015 1845   CHOL 104 09/01/2011 0542   TRIG 119 10/10/2015 1845   TRIG 88 09/01/2011 0542   HDL 34 (L) 10/10/2015 1845   HDL 42 09/01/2011 0542   CHOLHDL 4.3 10/10/2015 1845   VLDL 24 10/10/2015 1845   VLDL 18 09/01/2011 0542   LDLCALC 87 10/10/2015 1845   LDLCALC 44 09/01/2011 0542    HgbA1C:  Lab Results  Component Value Date   HGBA1C 6.2 (H) 01/17/2015    Urine Drug Screen:     Component Value Date/Time   LABOPIA NONE DETECTED 01/21/2017 0949   LABOPIA POSITIVE (A) 09/03/2015 0345   COCAINSCRNUR NONE DETECTED 01/21/2017 0949   LABBENZ POSITIVE (A) 01/21/2017 0949   LABBENZ NONE DETECTED 09/03/2015 0345   AMPHETMU NONE DETECTED 01/21/2017 0949   AMPHETMU NONE DETECTED 09/03/2015 0345   THCU NONE DETECTED 01/21/2017 0949   THCU NONE DETECTED 09/03/2015 0345   LABBARB NONE DETECTED 01/21/2017 0949   LABBARB NONE DETECTED 09/03/2015 0345    Alcohol Level:  Recent Labs Lab 01/21/17 0948  ETH <5     Imaging: Dg Chest 1 View  Result Date: 01/21/2017 CLINICAL DATA:  Altered mental status. Possible oxycodone overdose. Ex-smoker. EXAM: CHEST 1 VIEW COMPARISON:  12/02/2016. FINDINGS: Decreased inspiration. No gross change in enlargement of the cardiac silhouette with a globular configuration. Stable post CABG changes. Mild prominence of the interstitial markings, accentuated by the poor inspiration. No pleural fluid seen. IMPRESSION: No acute abnormality. Grossly stable cardiomegaly and mild chronic interstitial lung disease. The globular configuration of the heart suggests the possibility of a pericardial effusion. Electronically Signed   By: Claudie Revering M.D.   On: 01/21/2017 12:42   Ct Head Wo Contrast  Result Date: 01/21/2017 CLINICAL DATA:  Patient found  unresponsive.  Suspected overdose. EXAM: CT HEAD WITHOUT CONTRAST TECHNIQUE: Contiguous axial images were obtained from the base of the skull through the vertex without intravenous contrast. COMPARISON:  12/02/2016 FINDINGS: Brain: No evidence of acute infarction, hemorrhage, hydrocephalus, extra-axial collection or mass lesion/mass effect. Moderate brain parenchymal volume loss and periventricular microangiopathy. Vascular: Calcific atherosclerotic disease at the skullbase.  Skull: Normal. Negative for fracture or focal lesion. Sinuses/Orbits: No acute finding. Other: None. IMPRESSION: No acute intracranial abnormality. Atrophy, chronic microvascular disease. Electronically Signed   By: Fidela Salisbury M.D.   On: 01/21/2017 11:29    Assessment: 81 y.o. male presenting with AMS.  Has a history of atrial fibrillation on no antiplatelet or anticoagulation at this time.  Head CT reviewed and shows no acute changes.  Can not rule out a shower of emboli.  Would not be able to perform MRI due to agitation but will attempt to rule out possible LVO that could be addressed.   Case discussed at length with wife, due to poor renal function.  She wishes that we go ahead with all testing.    Stroke Risk Factors - atrial fibrillation, hyperlipidemia, hypertension and sleep apnea  Plan: 1. CTA of head and neck.   Case discussed with Dr. Moss Mc, MD Neurology [phone] 01/21/2017, 1:45 PM  Addendum: CTA reviewed and shows no evidence of LVO.  Has a history of metabolic encephalopathy but no significant metabolic abnormalities noted.  Although patient not a tPA or intervention candidate will still need to rule out seizure.    Recommendations: 1. HgbA1c, fasting lipid panel 2. MRI of the brain without contrast 3. PT consult, OT consult, Speech consult 4. Echocardiogram 5. Prophylactic therapy-Antiplatelet med: Aspirin - dose 356m rectally 7. NPO until RN stroke swallow screen 8.  Telemetry monitoring 9. Frequent neuro checks 10. EEG  LAlexis Goodell MD Neurology [phone]

## 2017-01-21 NOTE — ED Notes (Addendum)
Patient changed from hard wrist restraints to soft wrist restraints. Good pulses and cap refill noted to bilateral arms. Full range of motion assessed in both arms. Patient continues to be combative with staff and attempting to climb out of bed.

## 2017-01-21 NOTE — Progress Notes (Signed)
*  PRELIMINARY RESULTS* Echocardiogram 2D Echocardiogram has been performed.  Vincent Mason 01/21/2017, 3:13 PM

## 2017-01-21 NOTE — Progress Notes (Signed)
Critical Lab.  Troponin 0.71. Dr. Margaretmary Eddy notified.

## 2017-01-21 NOTE — ED Notes (Signed)
In and out cath completed by this RN. Assisted by Kathlee Nations, RN. Sterile technique maintained.

## 2017-01-21 NOTE — ED Provider Notes (Signed)
[facility] Emergency Department Provider Note  ____________________________________________   First MD Initiated Contact with Patient 01/21/17 [phone]     (approximate)  I have reviewed the triage vital signs and the nursing notes.   HISTORY  Chief Complaint Drug Overdose   HPI Vincent Mason is a 81 y.o. male with a history of COPD, acute cephalopathy as well as initial fibrillation and chronic kidney disease who is presenting to the emergency department today agitated and altered.  EMS reports that family stated the patient had been vomiting yesterday evening. They were then unable to awaken this morning and EMS was called. Fire rescue appeared first on scene and gave the patient intranasal Narcan because of his long-standing oxycodone use for leg pain. The patient went for unresponsive and agitated. EMS required 2 mg of Versed en route to help calm the patient for transport. The patient is unable to give any further history at this time as he remains acutely agitated, just saying "oh no!" Repeatedly.   Past Medical History:  Diagnosis Date  . Acute encephalopathy 09/03/2015  . Atrial fibrillation and flutter [facility]) 2013   Dr Nehemiah Massed at [facility]  on [place].   . C. difficile colitis 09/12/2015  . COPD (chronic obstructive pulmonary disease) ([place])   . HCAP (healthcare-associated pneumonia) 09/12/2015  . Hyperlipidemia   . Hypertension   . Kidney disease, chronic, stage III (moderate, EGFR 30-59 ml/min)    ARF 2013  . OSA (obstructive sleep apnea)    non-compliant with CPAP.   Marland Kitchen Severe sepsis with septic shock ([place]) 09/12/2015    Patient Active Problem List   Diagnosis Date Noted  . Acute kidney injury ([place]) 12/02/2016  . Blood in stool   . Anemia due to chronic blood loss   . Hypomagnesemia 09/14/2015  . Chronic atrial fibrillation ([place]) 09/12/2015  . Essential hypertension 09/12/2015  . Fecal occult blood test positive 09/12/2015  . Nodular hyperplasia  of liver   . Normocytic anemia 09/03/2015  . Opioid dependence ([place]) 09/03/2015  . Hypokalemia 08/26/2015  . Hyperuricemia 07/09/2015  . Coronary artery disease 01/22/2015  . Chronic kidney disease (CKD), stage III (moderate) 01/22/2015  . Chronic constipation 01/22/2015  . COPD (chronic obstructive pulmonary disease) ([place]) 01/22/2015  . ED (erectile dysfunction) of organic origin 01/22/2015  . Lymphedema of both lower extremities 01/22/2015  . Fatigue 01/22/2015  . Blood in the urine 01/22/2015  . Calculus of kidney 01/22/2015  . Memory loss 01/22/2015  . Hypertensive pulmonary vascular disease ([place]) 01/22/2015  . Displacement of cervical intervertebral disc without myelopathy 01/22/2015  . Seizures ([place]) 01/17/2015  . Obstructive sleep apnea 01/17/2015  . Hyperlipidemia 01/17/2015  . Leg swelling 01/17/2015  . Degenerative disc disease, lumbar 01/17/2015  . Back pain 11/12/2014  . TI (tricuspid incompetence) 08/02/2014    Past Surgical History:  Procedure Laterality Date  . CATARACT EXTRACTION    . CORONARY ARTERY BYPASS GRAFT  10/1997  . history of cervical discectomy  2009   C5-C6  . Hyperplastic colon polyp  02/2002   Sigmoid polyps    Prior to Admission medications   Medication Sig Start Date End Date Taking? Authorizing Provider  ferrous sulfate 325 (65 FE) MG tablet Take 1 tablet (325 mg total) by mouth 2 (two) times daily with a meal. 12/07/16  Yes Gladstone Lighter, MD  methocarbamol (ROBAXIN) 750 MG tablet Take 750 mg by mouth at bedtime as needed for muscle spasms.   Yes [provider]  metolazone (ZAROXOLYN) 5 MG tablet Take 5 mg by mouth daily as needed (swelling).  12/17/16  Yes [provider]  metoprolol succinate (TOPROL-XL) 100 MG 24 hr tablet TAKE ONE-HALF TABLET DAILY 01/01/17  Yes Birdie Sons, MD  oxyCODONE (ROXICODONE) 15 MG immediate release tablet Take 1 tablet (15 mg total) by mouth every 6 (six) hours as needed for pain.  01/13/17  Yes Birdie Sons, MD  polyethylene glycol [facility] / GLYCOLAX) packet Take 17 g by mouth daily. 12/08/16  Yes Gladstone Lighter, MD  pramipexole (MIRAPEX) 0.25 MG tablet TAKE ONE TO TWO TABLETS BY MOUTH AT BEDTIME AS NEEDED FOR RESTLESS LEG 07/21/16  Yes Birdie Sons, MD  ranitidine (ZANTAC) 150 MG tablet Take 150 mg by mouth 2 (two) times daily. 12/18/16  Yes [provider]  spironolactone (ALDACTONE) 25 MG tablet Take 25 mg by mouth daily. 12/14/16  Yes [provider]  ULORIC 80 MG TABS TAKE ONE (1) TABLET EACH DAY 02/19/16  Yes Birdie Sons, MD  potassium chloride SA (K-DUR,KLOR-CON) 20 MEQ tablet Take 1 tablet (20 mEq total) by mouth 2 (two) times daily. Patient not taking: Reported on 12/15/2016 06/18/16   Birdie Sons, MD  torsemide (DEMADEX) 20 MG tablet Take 2 tablets (40 mg total) by mouth 2 (two) times daily. Patient not taking: Reported on 01/21/2017 12/07/16   Gladstone Lighter, MD    Allergies Decongestant  [oxymetazoline]  Family History  Problem Relation Age of Onset  . Hypertension Mother   . Hypertension Father     Social History Social History  Substance Use Topics  . Smoking status: Former Research scientist (life sciences)  . Smokeless tobacco: Never Used     Comment: Quit in 1990  . Alcohol use No    Review of Systems  Level V caveat secondary to altered mental status   ____________________________________________   PHYSICAL EXAM:  VITAL SIGNS: ED Triage Vitals  Enc Vitals Group     BP 01/21/17 0919 (!) 125/96     Pulse Rate 01/21/17 0919 (!) 130     Resp 01/21/17 0919 (!) 25     Temp --      Temp src --      SpO2 01/21/17 0919 99 %     Weight 01/21/17 0924 190 lb (86.2 kg)     Height 01/21/17 0924 5' 10"  (1.778 m)     Head Circumference --      Peak Flow --      Pain Score --      Pain Loc --      Pain Edu? --      Excl. in [place]? --     Constitutional: Alert But disoriented. Begin the saying "oh no"   thrashing on the stretcher  despite 2 mg of IM Versed prior to arrival. Eyes: Conjunctivae are normal. PERRLA. 4 mm and reactive bilaterally. Head: Atraumatic. Nose: No congestion/rhinnorhea. Mouth/Throat: Mucous membranes are moist.  Neck: No stridor.   Cardiovascular: Tachycardic with an irregularly irregular rhythm. Grossly normal heart sounds.   Respiratory: Normal respiratory effort.  No retractions. Lungs CTAB. Gastrointestinal: Soft and nontender. No distention. No CVA tenderness. Musculoskeletal: Wearing Una boots.  Una boots were removed and patient has underlying edema without induration, erythema or pus visualized.   Neurologic:  No gross focal neurologic deficits are appreciated. Skin:  Skin is warm, dry and intact. No rash noted. Psychiatric: Agitated  ____________________________________________   LABS (all labs ordered are listed, but only abnormal results are  displayed)  Labs Reviewed  CBC WITH DIFFERENTIAL/PLATELET - Abnormal; Notable for the following:       Result Value   RBC 3.65 (*)    Hemoglobin 10.8 (*)    HCT 32.8 (*)    RDW 19.6 (*)    Lymphs Abs 0.7 (*)    All other components within normal limits  COMPREHENSIVE METABOLIC PANEL - Abnormal; Notable for the following:    Glucose, Bld 130 (*)    BUN 32 (*)    Creatinine, Ser 2.01 (*)    Calcium 8.8 (*)    ALT 11 (*)    GFR calc non Af Amer 30 (*)    GFR calc Af Amer 35 (*)    All other components within normal limits  URINALYSIS, COMPLETE (UACMP) WITH MICROSCOPIC - Abnormal; Notable for the following:    Color, Urine STRAW (*)    APPearance CLEAR (*)    All other components within normal limits  URINE DRUG SCREEN, QUALITATIVE ([facility] ONLY) - Abnormal; Notable for the following:    Benzodiazepine, Ur Scrn POSITIVE (*)    All other components within normal limits  ACETAMINOPHEN LEVEL - Abnormal; Notable for the following:    Acetaminophen (Tylenol), Serum <10 (*)    All other components within normal limits  TSH - Abnormal;  Notable for the following:    TSH 66.236 (*)    All other components within normal limits  GLUCOSE, CAPILLARY - Abnormal; Notable for the following:    Glucose-Capillary 117 (*)    All other components within normal limits  T4, FREE - Abnormal; Notable for the following:    Free T4 0.38 (*)    All other components within normal limits  LIPASE, BLOOD  TROPONIN I  SALICYLATE LEVEL  ETHANOL  AMMONIA  PROTIME-INR  APTT  T3   ____________________________________________  EKG  ED ECG REPORT I, Doran Stabler, the attending physician, personally viewed and interpreted this ECG.   Date: 01/21/2017  EKG Time: 917  Rate: 127  Rhythm: atrial fibrillation, rate 127. Several PVCs.  Axis: Normal  Intervals:right bundle branch block and left posterior fascicular block  ST&T Change: No ST segment elevation or depression. No abnormal T-wave inversion. We will repeat once the patient is more calm as there is a lot of baseline static secondary to patient moving during the exam.  ____________________________________________  RADIOLOGY  No acute intracranial abnormality seen on the CT of the brain.  Angiography without acute finding. ____________________________________________   PROCEDURES  Procedure(s) performed:   Procedures  Critical Care performed:   ____________________________________________   INITIAL IMPRESSION / ASSESSMENT AND PLAN / ED COURSE  Pertinent labs & imaging results that were available during my care of the patient were reviewed by me and considered in my medical decision making (see chart for details).  ----------------------------------------- 9:35 AM on 01/21/2017 -----------------------------------------   Behavioral Restraint Provider Note:  Behavioral Indicators: Danger to self, Danger to others and Violent behavior     Reaction to intervention: resisting     Review of systems: pt still struggling despite  restraints     History: History and Physical reviewed and Drugs and Medications reviewed     Mental Status Exam: Agitated.  Speaks words but not conversive.   Restraint Continuation: Change to soft restraints.     Restraint Rationale Continuation: Patient remains in soft restraints.      ----------------------------------------- 10:45 AM on 01/21/2017 -----------------------------------------  Patient now more sedate but still flowing his lower extremities  intermittently. His wife is not the bedside and says that he was acting normally yesterday except for some nausea and vomiting. However, she states this morning he yelled out and she responded and found him with his "whole body shaking." He does not have a seizure history. She is unsure if he overdosed on his oxycodone. After the seizure-like episode that lasted only several seconds she said he again became largely unresponsive and this is when she called EMS. Patient will be transferred to CAT scan as soon as possible for evaluation of intracranial pathology.  ----------------------------------------- 10:56 AM on 01/21/2017 -----------------------------------------  Discussed case with Dr. Doy Mince who is going to evaluate the patient. She is recommending possible CTA for posterior circulation issue. However, the patient has chronic renal failure. She will evaluate the patient and discussed with family.  ----------------------------------------- 1:13 PM on 01/21/2017 -----------------------------------------  Patient seen and evaluated by Dr. Doy Mince who is suspicious for posterior circulation thrombus given the patient's presentation. She is recommending CT angiography. The patient has renal insufficiency. 30 GFR is the cough. The patient will be given a reduced dye load. Dr. Doy Mince had an extensive discussion with the patient's wife about possible complications of kidney failure. She says that she is understanding of  the patient needs to be on dialysis and this leads to a diagnosis. Patient will also be given Versed prior to CAT scan to that he may tolerate the procedure. Unna boot was removed while the patient has pitting edema bilaterally he does not appear to have induration or any indication of cellulitis. TSH is also high but the patient is not hypothermic or bradycardic. Also agitated and not in coma. Unlikely to be myxedema coma.  ----------------------------------------- 2:33 PM on 01/21/2017 -----------------------------------------  Patient remains confused but with slightly improved mentation. Now speaking in sentences, intermittently. Reassuring imaging. Will be admitted to the hospital for altered mental status. Signed out to Dr. Margaretmary Eddy.   ____________________________________________   FINAL CLINICAL IMPRESSION(S) / ED DIAGNOSES  Opiate overdose. Altered mental status.    NEW MEDICATIONS STARTED DURING THIS VISIT:  New Prescriptions   No medications on file     Note:  This document was prepared using Dragon voice recognition software and may include unintentional dictation errors.     Orbie Pyo, MD 01/21/17 1434

## 2017-01-21 NOTE — ED Triage Notes (Addendum)
Patient presents to ED via [facility] for potential overdose on oxycodone. When the fire department arrived the patient was non responsive but did have pulses and was breathing. EMS gave 1mg  of Narcan with little response. EMS gave 2mg  of versed IM in route due to combativeness. Upon arrival patient is yelling and swinging at staff.

## 2017-01-21 NOTE — ED Notes (Signed)
Report called to [facility].

## 2017-01-21 NOTE — ED Notes (Signed)
Patient placed in bilateral soft wrist restraints at this time. MD at bedside.

## 2017-01-21 NOTE — Plan of Care (Signed)
Problem: Education: Goal: Knowledge of [place] [facility] Education information/materials will improve Outcome: Progressing Oriented to room and unit.    

## 2017-01-21 NOTE — H&P (Signed)
[facility] [place] NAME: Vincent Mason    MR#:  [id number]  DATE OF BIRTH:  03-10-1936  DATE OF ADMISSION:  01/21/2017  PRIMARY CARE PHYSICIAN: Birdie Sons, MD   REQUESTING/REFERRING PHYSICIAN: Orbie Pyo,   CHIEF COMPLAINT:   altered mental status HISTORY OF PRESENT ILLNESS:  Vincent Mason  is a 81 y.o. male with a known history of chronic atrial fibrillation, COPD, hyperlipidemia, hypertension, chronic kidney disease and multiple other medical problems is brought into the ED for altered mental status.as reported by the family to EMS patient was vomiting yesterday evening. Unable to awaken him this morning and EMS was called.Patient was given intranasal Narcan at bedside by the EMS as he has long-standing history of oxycodone use for leg pain subsequently patient was agitated. CT head is negative and hospitalist team is called to admit the patient. During my examination patient has pinpoint pupils, spontaneously moving his extremities, no family at the bedside  PAST MEDICAL HISTORY:   Past Medical History:  Diagnosis Date  . Acute encephalopathy 09/03/2015  . Atrial fibrillation and flutter [facility]) 2013   Dr Nehemiah Massed at [facility]  on [address].   . C. difficile colitis 09/12/2015  . COPD (chronic obstructive pulmonary disease) ([place])   . HCAP (healthcare-associated pneumonia) 09/12/2015  . Hyperlipidemia   . Hypertension   . Kidney disease, chronic, stage III (moderate, EGFR 30-59 ml/min)    ARF 2013  . OSA (obstructive sleep apnea)    non-compliant with CPAP.   Marland Kitchen Severe sepsis with septic shock ([place]) 09/12/2015    PAST SURGICAL HISTOIRY:   Past Surgical History:  Procedure Laterality Date  . CATARACT EXTRACTION    . CORONARY ARTERY BYPASS GRAFT  10/1997  . history of cervical discectomy  2009   C5-C6  . Hyperplastic colon polyp  02/2002   Sigmoid polyps    SOCIAL HISTORY:   Social History  Substance Use Topics   . Smoking status: Former Research scientist (life sciences)  . Smokeless tobacco: Never Used     Comment: Quit in 1990  . Alcohol use No    FAMILY HISTORY:   Family History  Problem Relation Age of Onset  . Hypertension Mother   . Hypertension Father     DRUG ALLERGIES:   Allergies  Allergen Reactions  . Decongestant  [Oxymetazoline]     REVIEW OF SYSTEMS:  Review of system unobtainable  MEDICATIONS AT HOME:   Prior to Admission medications   Medication Sig Start Date End Date Taking? Authorizing Provider  ferrous sulfate 325 (65 FE) MG tablet Take 1 tablet (325 mg total) by mouth 2 (two) times daily with a meal. 12/07/16  Yes Gladstone Lighter, MD  methocarbamol (ROBAXIN) 750 MG tablet Take 750 mg by mouth at bedtime as needed for muscle spasms.   Yes [provider]  metolazone (ZAROXOLYN) 5 MG tablet Take 5 mg by mouth daily as needed (swelling).  12/17/16  Yes [provider]  metoprolol succinate (TOPROL-XL) 100 MG 24 hr tablet TAKE ONE-HALF TABLET DAILY 01/01/17  Yes Birdie Sons, MD  oxyCODONE (ROXICODONE) 15 MG immediate release tablet Take 1 tablet (15 mg total) by mouth every 6 (six) hours as needed for pain. 01/13/17  Yes Birdie Sons, MD  polyethylene glycol [facility] / GLYCOLAX) packet Take 17 g by mouth daily. 12/08/16  Yes Gladstone Lighter, MD  pramipexole (MIRAPEX) 0.25 MG tablet TAKE ONE TO TWO TABLETS BY MOUTH AT BEDTIME AS NEEDED  FOR RESTLESS LEG 07/21/16  Yes Birdie Sons, MD  ranitidine (ZANTAC) 150 MG tablet Take 150 mg by mouth 2 (two) times daily. 12/18/16  Yes [provider]  spironolactone (ALDACTONE) 25 MG tablet Take 25 mg by mouth daily. 12/14/16  Yes [provider]  ULORIC 80 MG TABS TAKE ONE (1) TABLET EACH DAY 02/19/16  Yes Birdie Sons, MD  potassium chloride SA (K-DUR,KLOR-CON) 20 MEQ tablet Take 1 tablet (20 mEq total) by mouth 2 (two) times daily. Patient not taking: Reported on 12/15/2016 06/18/16   Birdie Sons, MD   torsemide (DEMADEX) 20 MG tablet Take 2 tablets (40 mg total) by mouth 2 (two) times daily. Patient not taking: Reported on 01/21/2017 12/07/16   Gladstone Lighter, MD      VITAL SIGNS:  Blood pressure 120/70, pulse (!) 104, resp. rate 20, height 5' 10"  (1.778 m), weight 86.2 kg (190 lb), SpO2 100 %.  PHYSICAL EXAMINATION:  GENERAL:  81 y.o.-year-old patient lying in the bed with no acute distress.  EYES:pinpoint pupils, sluggish response to light No scleral icterus.   HEENT: Head atraumatic, normocephalic. Oropharynx and nasopharynx clear.  NECK:  Supple, no jugular venous distention. No thyroid enlargement, no tenderness.  LUNGS: Normal breath sounds bilaterally, no wheezing, rales,rhonchi or crepitation. No use of accessory muscles of respiration.  CARDIOVASCULAR: S1, S2 normal. No murmurs, rubs, or gallops.  ABDOMEN: Soft, nontender, nondistended. Bowel sounds present. No organomegaly or mass.  EXTREMITIES: No pedal edema, cyanosis, or clubbing.  NEUROLOGIC: encephalopathic was spontaneously moving all his extremities PSYCHIATRIC: The patient is altered SKIN: No obvious rash, lesion, or ulcer.   LABORATORY PANEL:   CBC  Recent Labs Lab 01/21/17 0948  WBC 6.8  HGB 10.8*  HCT 32.8*  PLT 170   ------------------------------------------------------------------------------------------------------------------  Chemistries   Recent Labs Lab 01/21/17 0948  NA 138  K 3.7  CL 101  CO2 25  GLUCOSE 130*  BUN 32*  CREATININE 2.01*  CALCIUM 8.8*  AST 23  ALT 11*  ALKPHOS 40  BILITOT 0.7   ------------------------------------------------------------------------------------------------------------------  Cardiac Enzymes  Recent Labs Lab 01/21/17 0948  TROPONINI <0.03   ------------------------------------------------------------------------------------------------------------------  RADIOLOGY:  Ct Angio Head W Or Wo Contrast  Result Date: 01/21/2017 CLINICAL  DATA:  Encephalopathy. Altered mental status. Overdose on OxyContin. EXAM: CT ANGIOGRAPHY HEAD AND NECK TECHNIQUE: Multidetector CT imaging of the head and neck was performed using the standard protocol during bolus administration of intravenous contrast. Multiplanar CT image reconstructions and MIPs were obtained to evaluate the vascular anatomy. Carotid stenosis measurements (when applicable) are obtained utilizing NASCET criteria, using the distal internal carotid diameter as the denominator. CONTRAST:  60 cc Isovue 370 intravenous COMPARISON:  Head CT from earlier today.  Brain MRI 09/02/2016. FINDINGS: CTA NECK FINDINGS Aortic arch: Atherosclerosis.  Changes of CABG.  No acute finding. Right carotid system: Mild atherosclerotic plaque at the common carotid bifurcation. No stenosis or ulceration. Left carotid system: Mild to moderate for age calcified plaque at the common carotid bifurcation and proximal bulb. No stenosis or ulceration. Negative for dissection. Vertebral arteries: No proximal subclavian atherosclerosis. There is atheromatous plaque at both vertebral artery ostia with mild narrowing on the right. Otherwise the vessels are smooth and widely patent to the dura. Skeleton: No acute or aggressive finding. Other neck: No significant finding. Subcentimeter right thyroid nodule that is incidental based on size. Upper chest: Interstitial thickening and airway thickening. Subpleural reticular densities in the biapical lungs, nonspecific. Mucus noted within the  trachea. Review of the MIP images confirms the above findings CTA HEAD FINDINGS Anterior circulation: Diffuse atheromatous calcified plaque on the carotid siphons with mild left para clinoid segment stenosis. No major branch occlusion or proximal stenosis. Negative for aneurysm. Posterior circulation: Symmetric vertebral arteries. Unremarkable vertebrobasilar branching. Vessels are smooth and widely patent. Venous sinuses: Patent Anatomic variants:  None significant Delayed phase: No abnormal intracranial enhancement. There is generalized atrophy as seen on prior noncontrast study. Review of the MIP images confirms the above findings IMPRESSION: No acute finding, including emergent large vessel occlusion. Cervical and intracranial atherosclerosis without flow limiting stenosis or ulceration. Generalized cerebral atrophy. Suspect mild interstitial edema in the apical lungs. Electronically Signed   By: Monte Fantasia M.D.   On: 01/21/2017 14:08   Dg Chest 1 View  Result Date: 01/21/2017 CLINICAL DATA:  Altered mental status. Possible oxycodone overdose. Ex-smoker. EXAM: CHEST 1 VIEW COMPARISON:  12/02/2016. FINDINGS: Decreased inspiration. No gross change in enlargement of the cardiac silhouette with a globular configuration. Stable post CABG changes. Mild prominence of the interstitial markings, accentuated by the poor inspiration. No pleural fluid seen. IMPRESSION: No acute abnormality. Grossly stable cardiomegaly and mild chronic interstitial lung disease. The globular configuration of the heart suggests the possibility of a pericardial effusion. Electronically Signed   By: Claudie Revering M.D.   On: 01/21/2017 12:42   Ct Head Wo Contrast  Result Date: 01/21/2017 CLINICAL DATA:  Patient found unresponsive.  Suspected overdose. EXAM: CT HEAD WITHOUT CONTRAST TECHNIQUE: Contiguous axial images were obtained from the base of the skull through the vertex without intravenous contrast. COMPARISON:  12/02/2016 FINDINGS: Brain: No evidence of acute infarction, hemorrhage, hydrocephalus, extra-axial collection or mass lesion/mass effect. Moderate brain parenchymal volume loss and periventricular microangiopathy. Vascular: Calcific atherosclerotic disease at the skullbase. Skull: Normal. Negative for fracture or focal lesion. Sinuses/Orbits: No acute finding. Other: None. IMPRESSION: No acute intracranial abnormality. Atrophy, chronic microvascular disease.  Electronically Signed   By: Fidela Salisbury M.D.   On: 01/21/2017 11:29   Ct Angio Neck W And/or Wo Contrast  Result Date: 01/21/2017 CLINICAL DATA:  Encephalopathy. Altered mental status. Overdose on OxyContin. EXAM: CT ANGIOGRAPHY HEAD AND NECK TECHNIQUE: Multidetector CT imaging of the head and neck was performed using the standard protocol during bolus administration of intravenous contrast. Multiplanar CT image reconstructions and MIPs were obtained to evaluate the vascular anatomy. Carotid stenosis measurements (when applicable) are obtained utilizing NASCET criteria, using the distal internal carotid diameter as the denominator. CONTRAST:  60 cc Isovue 370 intravenous COMPARISON:  Head CT from earlier today.  Brain MRI 09/02/2016. FINDINGS: CTA NECK FINDINGS Aortic arch: Atherosclerosis.  Changes of CABG.  No acute finding. Right carotid system: Mild atherosclerotic plaque at the common carotid bifurcation. No stenosis or ulceration. Left carotid system: Mild to moderate for age calcified plaque at the common carotid bifurcation and proximal bulb. No stenosis or ulceration. Negative for dissection. Vertebral arteries: No proximal subclavian atherosclerosis. There is atheromatous plaque at both vertebral artery ostia with mild narrowing on the right. Otherwise the vessels are smooth and widely patent to the dura. Skeleton: No acute or aggressive finding. Other neck: No significant finding. Subcentimeter right thyroid nodule that is incidental based on size. Upper chest: Interstitial thickening and airway thickening. Subpleural reticular densities in the biapical lungs, nonspecific. Mucus noted within the trachea. Review of the MIP images confirms the above findings CTA HEAD FINDINGS Anterior circulation: Diffuse atheromatous calcified plaque on the carotid siphons with mild left para  clinoid segment stenosis. No major branch occlusion or proximal stenosis. Negative for aneurysm. Posterior circulation:  Symmetric vertebral arteries. Unremarkable vertebrobasilar branching. Vessels are smooth and widely patent. Venous sinuses: Patent Anatomic variants: None significant Delayed phase: No abnormal intracranial enhancement. There is generalized atrophy as seen on prior noncontrast study. Review of the MIP images confirms the above findings IMPRESSION: No acute finding, including emergent large vessel occlusion. Cervical and intracranial atherosclerosis without flow limiting stenosis or ulceration. Generalized cerebral atrophy. Suspect mild interstitial edema in the apical lungs. Electronically Signed   By: Monte Fantasia M.D.   On: 01/21/2017 14:08    EKG:   Orders placed or performed during the hospital encounter of 01/21/17  . ED EKG  . ED EKG  . EKG 12-Lead  . EKG 12-Lead    IMPRESSION AND PLAN:   Vincent Mason  is a 81 y.o. male with a known history of chronic atrial fibrillation, COPD, hyperlipidemia, hypertension, chronic kidney disease and multiple other medical problems is brought into the ED for altered mental status.as reported by the family to EMS patient was vomiting yesterday evening. Unable to awaken him this morning and EMS was called  # ACUTE ENCEPHALOPATHY-unclear etiology Possibly from illicit drugs, urine drug screen is positive for benzos/post ictal as patient has history of seizures Telemetry Neuro checks Neurology consult Get EEG Nothing by mouth Patient is maintaining airway and hemodynamically stable Ativan as needed for breakthrough seizures  #Chest x-ray with possible pericardial effusion Stat echocardiogram ordered Cardiology consult is placed and discussed with [place] No pericardial rub on examination  #elevated TSH and subtherapeutic T4 Patient does not have any history of hypothyroidism, not on thyroid meds Levothyroxine 25 g IV was ordered once Will check T3 levels in a.m.  #Chronic atrial fibrillation Currently rate controlled Cardiology consult  placed to [facility] cardiology group Patient is currently nothing by mouth  #chronic kidney disease stage III Avoid nephrotoxins and monitor renal function, provide IV fluids   GI and DVT prophylaxis  All the records are reviewed and case discussed with ED provider.  CODE STATUS: fc   TOTAL TIME TAKING CARE OF THIS PATIENT: 45  minutes.   Note: This dictation was prepared with Dragon dictation along with smaller phrase technology. Any transcriptional errors that result from this process are unintentional.  Nicholes Mango M.D on 01/21/2017 at 4:17 PM  Between 7am to 6pm - Pager - [phone]  After 6pm go to [email or web address] - password [email or web address] [place]  Office  [phone]  CC: Primary care physician; Birdie Sons, MD

## 2017-01-21 NOTE — ED Notes (Signed)
Echo tech at bedside to perform ECHO.

## 2017-01-22 ENCOUNTER — Inpatient Hospital Stay: Payer: Medicare Other

## 2017-01-22 DIAGNOSIS — I482 Chronic atrial fibrillation: Secondary | ICD-10-CM | POA: Diagnosis not present

## 2017-01-22 DIAGNOSIS — T402X1A Poisoning by other opioids, accidental (unintentional), initial encounter: Secondary | ICD-10-CM | POA: Diagnosis not present

## 2017-01-22 DIAGNOSIS — R4182 Altered mental status, unspecified: Secondary | ICD-10-CM | POA: Diagnosis not present

## 2017-01-22 LAB — COMPREHENSIVE METABOLIC PANEL
ALT: 11 U/L — ABNORMAL LOW (ref 17–63)
AST: 26 U/L (ref 15–41)
Albumin: 3.4 g/dL — ABNORMAL LOW (ref 3.5–5.0)
Alkaline Phosphatase: 34 U/L — ABNORMAL LOW (ref 38–126)
Anion gap: 7 (ref 5–15)
BUN: 26 mg/dL — ABNORMAL HIGH (ref 6–20)
CO2: 27 mmol/L (ref 22–32)
Calcium: 8.5 mg/dL — ABNORMAL LOW (ref 8.9–10.3)
Chloride: 108 mmol/L (ref 101–111)
Creatinine, Ser: 1.82 mg/dL — ABNORMAL HIGH (ref 0.61–1.24)
GFR calc Af Amer: 39 mL/min — ABNORMAL LOW (ref >60)
GFR calc non Af Amer: 34 mL/min — ABNORMAL LOW (ref >60)
Glucose, Bld: 86 mg/dL (ref 65–99)
Potassium: 3.7 mmol/L (ref 3.5–5.1)
Sodium: 142 mmol/L (ref 135–145)
Total Bilirubin: 0.8 mg/dL (ref 0.3–1.2)
Total Protein: 6 g/dL — ABNORMAL LOW (ref 6.5–8.1)

## 2017-01-22 LAB — LIPID PANEL
Cholesterol: 176 mg/dL (ref 0–200)
HDL: 44 mg/dL (ref >40)
LDL Cholesterol: 112 mg/dL — ABNORMAL HIGH (ref 0–99)
Total CHOL/HDL Ratio: 4 RATIO
Triglycerides: 98 mg/dL (ref <150)
VLDL: 20 mg/dL (ref 0–40)

## 2017-01-22 LAB — CBC
HCT: 31.5 % — ABNORMAL LOW (ref 40.0–52.0)
Hemoglobin: 10.6 g/dL — ABNORMAL LOW (ref 13.0–18.0)
MCH: 30.4 pg (ref 26.0–34.0)
MCHC: 33.6 g/dL (ref 32.0–36.0)
MCV: 90.4 fL (ref 80.0–100.0)
Platelets: 171 10*3/uL (ref 150–440)
RBC: 3.48 MIL/uL — ABNORMAL LOW (ref 4.40–5.90)
RDW: 19.6 % — ABNORMAL HIGH (ref 11.5–14.5)
WBC: 6.4 10*3/uL (ref 3.8–10.6)

## 2017-01-22 LAB — TROPONIN I: Troponin I: 1.72 ng/mL (ref <0.03)

## 2017-01-22 LAB — ECHOCARDIOGRAM COMPLETE
Height: 70 in
Weight: 3040 [oz_av]

## 2017-01-22 LAB — T3: T3, Total: 60 ng/dL — ABNORMAL LOW (ref 71–180)

## 2017-01-22 MED ORDER — APIXABAN 5 MG PO TABS: 5.0000 mg | ORAL_TABLET | Freq: Two times a day (BID) | ORAL | 0 refills | Status: DC

## 2017-01-22 MED ORDER — ASPIRIN EC 81 MG PO TBEC
81.0000 mg | DELAYED_RELEASE_TABLET | Freq: Every day | ORAL | Status: DC
  Administered 2017-01-22: 81 mg via ORAL
  Filled 2017-01-22: qty 1

## 2017-01-22 MED ORDER — APIXABAN 5 MG PO TABS: 5.0000 mg | ORAL_TABLET | Freq: Two times a day (BID) | ORAL | Status: DC

## 2017-01-22 MED ORDER — METOPROLOL SUCCINATE ER 25 MG PO TB24
12.5000 mg | ORAL_TABLET | Freq: Every day | ORAL | Status: DC
  Administered 2017-01-22: 12.5 mg via ORAL
  Filled 2017-01-22: qty 1

## 2017-01-22 MED ORDER — HEPARIN (PORCINE) IN NACL 100-0.45 UNIT/ML-% IJ SOLN: 1000.0000 [IU]/h | INTRAMUSCULAR | Status: DC

## 2017-01-22 MED ORDER — ENOXAPARIN SODIUM 100 MG/ML ~~LOC~~ SOLN: 85.0000 mg | Freq: Two times a day (BID) | SUBCUTANEOUS | Status: DC

## 2017-01-22 MED ORDER — HEPARIN (PORCINE) IN NACL 100-0.45 UNIT/ML-% IJ SOLN: 12.0000 [IU]/kg/h | INTRAMUSCULAR | Status: DC

## 2017-01-22 MED ORDER — ENOXAPARIN SODIUM 100 MG/ML ~~LOC~~ SOLN
85.0000 mg | Freq: Two times a day (BID) | SUBCUTANEOUS | Status: DC
  Administered 2017-01-22: 85 mg via SUBCUTANEOUS
  Filled 2017-01-22: qty 1

## 2017-01-22 MED ORDER — ENOXAPARIN SODIUM 60 MG/0.6ML ~~LOC~~ SOLN: 55.0000 mg | Freq: Once | SUBCUTANEOUS | Status: DC

## 2017-01-22 MED ORDER — ASPIRIN 81 MG PO TBEC: 81.0000 mg | DELAYED_RELEASE_TABLET | Freq: Every day | ORAL | 0 refills | Status: DC

## 2017-01-22 MED ORDER — HEPARIN SODIUM (PORCINE) 5000 UNIT/ML IJ SOLN: 5000.0000 [IU] | Freq: Three times a day (TID) | INTRAMUSCULAR | Status: DC

## 2017-01-22 MED ORDER — METOPROLOL SUCCINATE ER 25 MG PO TB24: 12.5000 mg | ORAL_TABLET | Freq: Every day | ORAL | 0 refills | Status: DC

## 2017-01-22 NOTE — Care Management (Signed)
As patient was leaving the unit, informed that [facility] is seeing patient for wound care.  Notified [facility] of observation "admission" and discharge. No resumption of care order required

## 2017-01-22 NOTE — Care Management Obs Status (Signed)
[place] NOTIFICATION   Patient Details  Name: Vincent Mason MRN: [id number] Date of Birth: Mar 24, 1936   Medicare Observation Status Notification Given:  Yes  Notice signed, one given to patient and the other to HIM for scanning  Katrina Stack, RN 01/22/2017, 5:08 PM

## 2017-01-22 NOTE — Progress Notes (Signed)
ANTICOAGULATION CONSULT NOTE - Initial Consult  Pharmacy Consult for Lovenox  Indication: chest pain/ACS  Allergies  Allergen Reactions  . Decongestant  [Oxymetazoline]     Patient Measurements: Height: _0  (177.8 cm) Weight: 188 lb 14.4 oz (85.7 kg) IBW/kg (Calculated) : 73 Heparin Dosing Weight:   Vital Signs: Temp: 98.2 F (36.8 C) (08/30 1647) Temp Source: Oral (08/30 1647) BP: 117/55 (08/30 2000) Pulse Rate: 88 (08/30 2000)  Labs:  Recent Labs  01/21/17 0948 01/21/17 0949 01/21/17 1656 01/21/17 2222  HGB 10.8*  --   --   --   HCT 32.8*  --   --   --   PLT 170  --   --   --   APTT  --  25  --   --   LABPROT  --  13.3  --   --   INR  --  1.02  --   --   CREATININE 2.01*  --   --   --   TROPONINI <0.03  --  0.71* 2.08*    Estimated Creatinine Clearance: 30.8 mL/min (A) (by C-G formula based on SCr of 2.01 mg/dL (H)).   Medical History: Past Medical History:  Diagnosis Date  . Acute encephalopathy 09/03/2015  . Atrial fibrillation and flutter [facility]) 2013   Dr Nehemiah Massed at [facility]  on [address].   . C. difficile colitis 09/12/2015  . COPD (chronic obstructive pulmonary disease) ([place])   . HCAP (healthcare-associated pneumonia) 09/12/2015  . Hyperlipidemia   . Hypertension   . Kidney disease, chronic, stage III (moderate, EGFR 30-59 ml/min)    ARF 2013  . OSA (obstructive sleep apnea)    non-compliant with CPAP.   Marland Kitchen Severe sepsis with septic shock ([place]) 09/12/2015    Medications:  Prescriptions Prior to Admission  Medication Sig Dispense Refill Last Dose  . ferrous sulfate 325 (65 FE) MG tablet Take 1 tablet (325 mg total) by mouth 2 (two) times daily with a meal. 60 tablet 3 Unknown at Unknown  . methocarbamol (ROBAXIN) 750 MG tablet Take 750 mg by mouth at bedtime as needed for muscle spasms.   PRN at PRN  . metolazone (ZAROXOLYN) 5 MG tablet Take 5 mg by mouth daily as needed (swelling).    Unknown at Unknown  . metoprolol succinate (TOPROL-XL) 100 MG 24  hr tablet TAKE ONE-HALF TABLET DAILY 30 tablet 12 Unknown at Unknown  . oxyCODONE (ROXICODONE) 15 MG immediate release tablet Take 1 tablet (15 mg total) by mouth every 6 (six) hours as needed for pain. 120 tablet 0 01/21/2017 at Unknown time  . polyethylene glycol (MIRALAX / GLYCOLAX) packet Take 17 g by mouth daily. 14 each 0 Unknown at Unknown  . pramipexole (MIRAPEX) 0.25 MG tablet TAKE ONE TO TWO TABLETS BY MOUTH AT BEDTIME AS NEEDED FOR RESTLESS LEG 60 tablet 5 PRN at PRN  . ranitidine (ZANTAC) 150 MG tablet Take 150 mg by mouth 2 (two) times daily.   Unknown at Unknown  . spironolactone (ALDACTONE) 25 MG tablet Take 25 mg by mouth daily.   Unknown at Unknown  . ULORIC 80 MG TABS TAKE ONE (1) TABLET EACH DAY 30 each 12 Unknown at Unknown  . potassium chloride SA (K-DUR,KLOR-CON) 20 MEQ tablet Take 1 tablet (20 mEq total) by mouth 2 (two) times daily. (Patient not taking: Reported on 12/15/2016) 60 tablet 5 Not Taking at Unknown time  . torsemide (DEMADEX) 20 MG tablet Take 2 tablets (40 mg total) by mouth 2 (  two) times daily. (Patient not taking: Reported on 01/21/2017)   Not Taking at Unknown time    Assessment: Pharmacy consulted to dose lovenox for ACS in this 81 year old male.  Pt received 30 mg dose on 8/30 @ 18:00.  Will order full dose lovenox. CrCl = 30.8 ml/min  Goal of Therapy:  Monitor platelets by anticoagulation protocol: Yes   Plan:  Lovenox 85 mg SQ Q12H ordered to start on 8/31 @ 0230.   Christon Parada D 01/22/2017,2:33 AM

## 2017-01-22 NOTE — Consult Note (Addendum)
Reason for Consult: Atrial fibrillation altered mental status Referring Physician: Juanetta Beets primary Dr Margaretmary Eddy hospitalist Cardiologist Dr. Pecolia Ades is an 81 y.o. male.  HPI: Patient's a 81 year old white male history of chronic atrial fibrillation currently not on anticoagulation obstructive sleep apnea noncompliant with CPAP history of COPD hyperlipidemia hypertension chronic renal insufficiency who was brought to emergency room after an episode of altered mental status witnessed by his wife while at home. Wife states that he was sitting in the chair and had an episode where he got stiff and with seizure-like activity loss of consciousness for a few minutes denied any chest pain rescue picked him treated with Narcan because it been on narcotics at home patient denies previous episodes. He's had chronic atrial fibrillation and was still in A. fib when he came to the emergency room but denies any chest pain even he has known coronary disease  Past Medical History:  Diagnosis Date  . Acute encephalopathy 09/03/2015  . Atrial fibrillation and flutter [facility]) 2013   Dr Nehemiah Massed at [facility]  on [address].   . C. difficile colitis 09/12/2015  . COPD (chronic obstructive pulmonary disease) ([place])   . HCAP (healthcare-associated pneumonia) 09/12/2015  . Hyperlipidemia   . Hypertension   . Kidney disease, chronic, stage III (moderate, EGFR 30-59 ml/min)    ARF 2013  . OSA (obstructive sleep apnea)    non-compliant with CPAP.   Marland Kitchen Severe sepsis with septic shock ([place]) 09/12/2015    Past Surgical History:  Procedure Laterality Date  . CATARACT EXTRACTION    . CORONARY ARTERY BYPASS GRAFT  10/1997  . history of cervical discectomy  2009   C5-C6  . Hyperplastic colon polyp  02/2002   Sigmoid polyps    Family History  Problem Relation Age of Onset  . Hypertension Mother   . Hypertension Father     Social History:  reports that he has quit smoking. He has never used smokeless tobacco. He  reports that he does not drink alcohol or use drugs.  Allergies:  Allergies  Allergen Reactions  . Decongestant  [Oxymetazoline]     Medications: I have reviewed the patient's current medications.  Results for orders placed or performed during the hospital encounter of 01/21/17 (from the past 48 hour(s))  Glucose, capillary     Status: Abnormal   Collection Time: 01/21/17  9:38 AM  Result Value Ref Range   Glucose-Capillary 117 (H) 65 - 99 mg/dL  CBC with Differential     Status: Abnormal   Collection Time: 01/21/17  9:48 AM  Result Value Ref Range   WBC 6.8 3.8 - 10.6 K/uL   RBC 3.65 (L) 4.40 - 5.90 MIL/uL   Hemoglobin 10.8 (L) 13.0 - 18.0 g/dL   HCT 32.8 (L) 40.0 - 52.0 %   MCV 89.7 80.0 - 100.0 fL   MCH 29.7 26.0 - 34.0 pg   MCHC 33.1 32.0 - 36.0 g/dL   RDW 19.6 (H) 11.5 - 14.5 %   Platelets 170 150 - 440 K/uL   Neutrophils Relative % 80 %   Neutro Abs 5.5 1.4 - 6.5 K/uL   Lymphocytes Relative 10 %   Lymphs Abs 0.7 (L) 1.0 - 3.6 K/uL   Monocytes Relative 9 %   Monocytes Absolute 0.6 0.2 - 1.0 K/uL   Eosinophils Relative 1 %   Eosinophils Absolute 0.1 0 - 0.7 K/uL   Basophils Relative 0 %   Basophils Absolute 0.0 0 - 0.1 K/uL  Comprehensive metabolic panel     Status: Abnormal   Collection Time: 01/21/17  9:48 AM  Result Value Ref Range   Sodium 138 135 - 145 mmol/L   Potassium 3.7 3.5 - 5.1 mmol/L   Chloride 101 101 - 111 mmol/L   CO2 25 22 - 32 mmol/L   Glucose, Bld 130 (H) 65 - 99 mg/dL   BUN 32 (H) 6 - 20 mg/dL   Creatinine, Ser 2.01 (H) 0.61 - 1.24 mg/dL   Calcium 8.8 (L) 8.9 - 10.3 mg/dL   Total Protein 6.8 6.5 - 8.1 g/dL   Albumin 3.8 3.5 - 5.0 g/dL   AST 23 15 - 41 U/L   ALT 11 (L) 17 - 63 U/L   Alkaline Phosphatase 40 38 - 126 U/L   Total Bilirubin 0.7 0.3 - 1.2 mg/dL   GFR calc non Af Amer 30 (L) >60 mL/min   GFR calc Af Amer 35 (L) >60 mL/min    Comment: (NOTE) The eGFR has been calculated using the CKD EPI equation. This calculation has not been  validated in all clinical situations. eGFR's persistently <60 mL/min signify possible Chronic Kidney Disease.    Anion gap 12 5 - 15  Lipase, blood     Status: None   Collection Time: 01/21/17  9:48 AM  Result Value Ref Range   Lipase 24 11 - 51 U/L  Troponin I     Status: None   Collection Time: 01/21/17  9:48 AM  Result Value Ref Range   Troponin I <0.03 <0.03 ng/mL  Acetaminophen level     Status: Abnormal   Collection Time: 01/21/17  9:48 AM  Result Value Ref Range   Acetaminophen (Tylenol), Serum <10 (L) 10 - 30 ug/mL    Comment:        THERAPEUTIC CONCENTRATIONS VARY SIGNIFICANTLY. A RANGE OF 10-30 ug/mL MAY BE AN EFFECTIVE CONCENTRATION FOR MANY PATIENTS. HOWEVER, SOME ARE BEST TREATED AT CONCENTRATIONS OUTSIDE THIS RANGE. ACETAMINOPHEN CONCENTRATIONS >150 ug/mL AT 4 HOURS AFTER INGESTION AND >50 ug/mL AT 12 HOURS AFTER INGESTION ARE OFTEN ASSOCIATED WITH TOXIC REACTIONS.   Salicylate level     Status: None   Collection Time: 01/21/17  9:48 AM  Result Value Ref Range   Salicylate Lvl <3.3 2.8 - 30.0 mg/dL  Ethanol     Status: None   Collection Time: 01/21/17  9:48 AM  Result Value Ref Range   Alcohol, Ethyl (B) <5 <5 mg/dL    Comment:        LOWEST DETECTABLE LIMIT FOR SERUM ALCOHOL IS 5 mg/dL FOR MEDICAL PURPOSES ONLY   Urinalysis, Complete w Microscopic     Status: Abnormal   Collection Time: 01/21/17  9:49 AM  Result Value Ref Range   Color, Urine STRAW (A) YELLOW   APPearance CLEAR (A) CLEAR   Specific Gravity, Urine 1.006 1.005 - 1.030   pH 7.0 5.0 - 8.0   Glucose, UA NEGATIVE NEGATIVE mg/dL   Hgb urine dipstick NEGATIVE NEGATIVE   Bilirubin Urine NEGATIVE NEGATIVE   Ketones, ur NEGATIVE NEGATIVE mg/dL   Protein, ur NEGATIVE NEGATIVE mg/dL   Nitrite NEGATIVE NEGATIVE   Leukocytes, UA NEGATIVE NEGATIVE   RBC / HPF 0-5 0 - 5 RBC/hpf   WBC, UA 0-5 0 - 5 WBC/hpf   Bacteria, UA NONE SEEN NONE SEEN   Squamous Epithelial / LPF NONE SEEN NONE SEEN    Mucus PRESENT   Urine Drug Screen, Qualitative ([facility] only)  Status: Abnormal   Collection Time: 01/21/17  9:49 AM  Result Value Ref Range   Tricyclic, Ur Screen NONE DETECTED NONE DETECTED   Amphetamines, Ur Screen NONE DETECTED NONE DETECTED   MDMA (Ecstasy)Ur Screen NONE DETECTED NONE DETECTED   Cocaine Metabolite,Ur [place] NONE DETECTED NONE DETECTED   Opiate, Ur Screen NONE DETECTED NONE DETECTED   Phencyclidine (PCP) Ur S NONE DETECTED NONE DETECTED   Cannabinoid 50 Ng, Ur [place] NONE DETECTED NONE DETECTED   Barbiturates, Ur Screen NONE DETECTED NONE DETECTED   Benzodiazepine, Ur Scrn POSITIVE (A) NONE DETECTED   Methadone Scn, Ur NONE DETECTED NONE DETECTED    Comment: (NOTE) 765  Tricyclics, urine               Cutoff 1000 ng/mL 200  Amphetamines, urine             Cutoff 1000 ng/mL 300  MDMA (Ecstasy), urine           Cutoff 500 ng/mL 400  Cocaine Metabolite, urine       Cutoff 300 ng/mL 500  Opiate, urine                   Cutoff 300 ng/mL 600  Phencyclidine (PCP), urine      Cutoff 25 ng/mL 700  Cannabinoid, urine              Cutoff 50 ng/mL 800  Barbiturates, urine             Cutoff 200 ng/mL 900  Benzodiazepine, urine           Cutoff 200 ng/mL 1000 Methadone, urine                Cutoff 300 ng/mL 1100 1200 The urine drug screen provides only a preliminary, unconfirmed 1300 analytical test result and should not be used for non-medical 1400 purposes. Clinical consideration and professional judgment should 1500 be applied to any positive drug screen result due to possible 1600 interfering substances. A more specific alternate chemical method 1700 must be used in order to obtain a confirmed analytical result.  1800 Gas chromato graphy / mass spectrometry (GC/MS) is the preferred 1900 confirmatory method.   Ammonia     Status: None   Collection Time: 01/21/17  9:49 AM  Result Value Ref Range   Ammonia 34 9 - 35 umol/L  TSH     Status: Abnormal   Collection Time:  01/21/17  9:49 AM  Result Value Ref Range   TSH 66.236 (H) 0.350 - 4.500 uIU/mL    Comment: Performed by a 3rd Generation assay with a functional sensitivity of <=0.01 uIU/mL.  Protime-INR     Status: None   Collection Time: 01/21/17  9:49 AM  Result Value Ref Range   Prothrombin Time 13.3 11.4 - 15.2 seconds   INR 1.02   APTT     Status: None   Collection Time: 01/21/17  9:49 AM  Result Value Ref Range   aPTT 25 24 - 36 seconds  T4, free     Status: Abnormal   Collection Time: 01/21/17  9:49 AM  Result Value Ref Range   Free T4 0.38 (L) 0.61 - 1.12 ng/dL    Comment: (NOTE) Biotin ingestion may interfere with free T4 tests. If the results are inconsistent with the TSH level, previous test results, or the clinical presentation, then consider biotin interference. If needed, order repeat testing after stopping biotin.   T3  Status: Abnormal   Collection Time: 01/21/17  9:49 AM  Result Value Ref Range   T3, Total 60 (L) 71 - 180 ng/dL    Comment: (NOTE) Performed At: [facility] [address] [place], [place] [id number] Lindon Romp MD FV:[id number]   Troponin I     Status: Abnormal   Collection Time: 01/21/17  4:56 PM  Result Value Ref Range   Troponin I 0.71 (HH) <0.03 ng/mL    Comment: CRITICAL RESULT CALLED TO, READ BACK BY AND VERIFIED WITH Lennart Pall RN AT [phone] 01/21/17. MSS   Troponin I     Status: Abnormal   Collection Time: 01/21/17 10:22 PM  Result Value Ref Range   Troponin I 2.08 (HH) <0.03 ng/mL    Comment: CRITICAL VALUE NOTED. VALUE IS CONSISTENT WITH PREVIOUSLY REPORTED/CALLED VALUE BY CAF   Troponin I     Status: Abnormal   Collection Time: 01/22/17  4:16 AM  Result Value Ref Range   Troponin I 1.72 (HH) <0.03 ng/mL    Comment: CRITICAL VALUE NOTED. VALUE IS CONSISTENT WITH PREVIOUSLY REPORTED/CALLED VALUE...[facility]  CBC     Status: Abnormal   Collection Time: 01/22/17  4:16 AM  Result Value Ref Range   WBC 6.4 3.8 - 10.6 K/uL   RBC  3.48 (L) 4.40 - 5.90 MIL/uL   Hemoglobin 10.6 (L) 13.0 - 18.0 g/dL   HCT 31.5 (L) 40.0 - 52.0 %   MCV 90.4 80.0 - 100.0 fL   MCH 30.4 26.0 - 34.0 pg   MCHC 33.6 32.0 - 36.0 g/dL   RDW 19.6 (H) 11.5 - 14.5 %   Platelets 171 150 - 440 K/uL  Comprehensive metabolic panel     Status: Abnormal   Collection Time: 01/22/17  4:16 AM  Result Value Ref Range   Sodium 142 135 - 145 mmol/L   Potassium 3.7 3.5 - 5.1 mmol/L   Chloride 108 101 - 111 mmol/L   CO2 27 22 - 32 mmol/L   Glucose, Bld 86 65 - 99 mg/dL   BUN 26 (H) 6 - 20 mg/dL   Creatinine, Ser 1.82 (H) 0.61 - 1.24 mg/dL   Calcium 8.5 (L) 8.9 - 10.3 mg/dL   Total Protein 6.0 (L) 6.5 - 8.1 g/dL   Albumin 3.4 (L) 3.5 - 5.0 g/dL   AST 26 15 - 41 U/L   ALT 11 (L) 17 - 63 U/L   Alkaline Phosphatase 34 (L) 38 - 126 U/L   Total Bilirubin 0.8 0.3 - 1.2 mg/dL   GFR calc non Af Amer 34 (L) >60 mL/min   GFR calc Af Amer 39 (L) >60 mL/min    Comment: (NOTE) The eGFR has been calculated using the CKD EPI equation. This calculation has not been validated in all clinical situations. eGFR's persistently <60 mL/min signify possible Chronic Kidney Disease.    Anion gap 7 5 - 15  Lipid panel     Status: Abnormal   Collection Time: 01/22/17  1:05 PM  Result Value Ref Range   Cholesterol 176 0 - 200 mg/dL   Triglycerides 98 <150 mg/dL   HDL 44 >40 mg/dL   Total CHOL/HDL Ratio 4.0 RATIO   VLDL 20 0 - 40 mg/dL   LDL Cholesterol 112 (H) 0 - 99 mg/dL    Comment:        Total Cholesterol/HDL:CHD Risk Coronary Heart Disease Risk Table  Men   Women  1/2 Average Risk   3.4   3.3  Average Risk       5.0   4.4  2 X Average Risk   9.6   7.1  3 X Average Risk  23.4   11.0        Use the calculated Patient Ratio above and the CHD Risk Table to determine the patient's CHD Risk.        ATP III CLASSIFICATION (LDL):  <100     mg/dL   Optimal  100-129  mg/dL   Near or Above                    Optimal  130-159  mg/dL   Borderline   160-189  mg/dL   High  >190     mg/dL   Very High     Ct Angio Head W Or Wo Contrast  Result Date: 01/21/2017 CLINICAL DATA:  Encephalopathy. Altered mental status. Overdose on OxyContin. EXAM: CT ANGIOGRAPHY HEAD AND NECK TECHNIQUE: Multidetector CT imaging of the head and neck was performed using the standard protocol during bolus administration of intravenous contrast. Multiplanar CT image reconstructions and MIPs were obtained to evaluate the vascular anatomy. Carotid stenosis measurements (when applicable) are obtained utilizing NASCET criteria, using the distal internal carotid diameter as the denominator. CONTRAST:  60 cc Isovue 370 intravenous COMPARISON:  Head CT from earlier today.  Brain MRI 09/02/2016. FINDINGS: CTA NECK FINDINGS Aortic arch: Atherosclerosis.  Changes of CABG.  No acute finding. Right carotid system: Mild atherosclerotic plaque at the common carotid bifurcation. No stenosis or ulceration. Left carotid system: Mild to moderate for age calcified plaque at the common carotid bifurcation and proximal bulb. No stenosis or ulceration. Negative for dissection. Vertebral arteries: No proximal subclavian atherosclerosis. There is atheromatous plaque at both vertebral artery ostia with mild narrowing on the right. Otherwise the vessels are smooth and widely patent to the dura. Skeleton: No acute or aggressive finding. Other neck: No significant finding. Subcentimeter right thyroid nodule that is incidental based on size. Upper chest: Interstitial thickening and airway thickening. Subpleural reticular densities in the biapical lungs, nonspecific. Mucus noted within the trachea. Review of the MIP images confirms the above findings CTA HEAD FINDINGS Anterior circulation: Diffuse atheromatous calcified plaque on the carotid siphons with mild left para clinoid segment stenosis. No major branch occlusion or proximal stenosis. Negative for aneurysm. Posterior circulation: Symmetric vertebral  arteries. Unremarkable vertebrobasilar branching. Vessels are smooth and widely patent. Venous sinuses: Patent Anatomic variants: None significant Delayed phase: No abnormal intracranial enhancement. There is generalized atrophy as seen on prior noncontrast study. Review of the MIP images confirms the above findings IMPRESSION: No acute finding, including emergent large vessel occlusion. Cervical and intracranial atherosclerosis without flow limiting stenosis or ulceration. Generalized cerebral atrophy. Suspect mild interstitial edema in the apical lungs. Electronically Signed   By: Monte Fantasia M.D.   On: 01/21/2017 14:08   Dg Chest 1 View  Result Date: 01/21/2017 CLINICAL DATA:  Altered mental status. Possible oxycodone overdose. Ex-smoker. EXAM: CHEST 1 VIEW COMPARISON:  12/02/2016. FINDINGS: Decreased inspiration. No gross change in enlargement of the cardiac silhouette with a globular configuration. Stable post CABG changes. Mild prominence of the interstitial markings, accentuated by the poor inspiration. No pleural fluid seen. IMPRESSION: No acute abnormality. Grossly stable cardiomegaly and mild chronic interstitial lung disease. The globular configuration of the heart suggests the possibility of a pericardial effusion. Electronically Signed  By: Claudie Revering M.D.   On: 01/21/2017 12:42   Ct Head Wo Contrast  Result Date: 01/21/2017 CLINICAL DATA:  Patient found unresponsive.  Suspected overdose. EXAM: CT HEAD WITHOUT CONTRAST TECHNIQUE: Contiguous axial images were obtained from the base of the skull through the vertex without intravenous contrast. COMPARISON:  12/02/2016 FINDINGS: Brain: No evidence of acute infarction, hemorrhage, hydrocephalus, extra-axial collection or mass lesion/mass effect. Moderate brain parenchymal volume loss and periventricular microangiopathy. Vascular: Calcific atherosclerotic disease at the skullbase. Skull: Normal. Negative for fracture or focal lesion.  Sinuses/Orbits: No acute finding. Other: None. IMPRESSION: No acute intracranial abnormality. Atrophy, chronic microvascular disease. Electronically Signed   By: Fidela Salisbury M.D.   On: 01/21/2017 11:29   Ct Angio Neck W And/or Wo Contrast  Result Date: 01/21/2017 CLINICAL DATA:  Encephalopathy. Altered mental status. Overdose on OxyContin. EXAM: CT ANGIOGRAPHY HEAD AND NECK TECHNIQUE: Multidetector CT imaging of the head and neck was performed using the standard protocol during bolus administration of intravenous contrast. Multiplanar CT image reconstructions and MIPs were obtained to evaluate the vascular anatomy. Carotid stenosis measurements (when applicable) are obtained utilizing NASCET criteria, using the distal internal carotid diameter as the denominator. CONTRAST:  60 cc Isovue 370 intravenous COMPARISON:  Head CT from earlier today.  Brain MRI 09/02/2016. FINDINGS: CTA NECK FINDINGS Aortic arch: Atherosclerosis.  Changes of CABG.  No acute finding. Right carotid system: Mild atherosclerotic plaque at the common carotid bifurcation. No stenosis or ulceration. Left carotid system: Mild to moderate for age calcified plaque at the common carotid bifurcation and proximal bulb. No stenosis or ulceration. Negative for dissection. Vertebral arteries: No proximal subclavian atherosclerosis. There is atheromatous plaque at both vertebral artery ostia with mild narrowing on the right. Otherwise the vessels are smooth and widely patent to the dura. Skeleton: No acute or aggressive finding. Other neck: No significant finding. Subcentimeter right thyroid nodule that is incidental based on size. Upper chest: Interstitial thickening and airway thickening. Subpleural reticular densities in the biapical lungs, nonspecific. Mucus noted within the trachea. Review of the MIP images confirms the above findings CTA HEAD FINDINGS Anterior circulation: Diffuse atheromatous calcified plaque on the carotid siphons with  mild left para clinoid segment stenosis. No major branch occlusion or proximal stenosis. Negative for aneurysm. Posterior circulation: Symmetric vertebral arteries. Unremarkable vertebrobasilar branching. Vessels are smooth and widely patent. Venous sinuses: Patent Anatomic variants: None significant Delayed phase: No abnormal intracranial enhancement. There is generalized atrophy as seen on prior noncontrast study. Review of the MIP images confirms the above findings IMPRESSION: No acute finding, including emergent large vessel occlusion. Cervical and intracranial atherosclerosis without flow limiting stenosis or ulceration. Generalized cerebral atrophy. Suspect mild interstitial edema in the apical lungs. Electronically Signed   By: Monte Fantasia M.D.   On: 01/21/2017 14:08   Mr Brain Wo Contrast  Result Date: 01/22/2017 CLINICAL DATA:  Initial evaluation for acute altered mental status, of unknown etiology. EXAM: MRI HEAD WITHOUT CONTRAST TECHNIQUE: Multiplanar, multiecho pulse sequences of the brain and surrounding structures were obtained without intravenous contrast. COMPARISON:  Comparison made with prior CT from 01/21/2017. FINDINGS: Brain: Diffuse prominence of the CSF containing spaces compatible with generalized age-related cerebral atrophy. Minimal chronic microvascular ischemic disease, stable from previous. No evidence for acute or subacute infarct. Gray-white matter differentiation maintained. No encephalomalacia to suggest chronic infarction. No evidence for acute or chronic intracranial hemorrhage. No mass lesion, midline shift or mass effect. Mild ventricular prominence related global parenchymal volume loss of hydrocephalus.  No extra-axial fluid collection. Pituitary gland suprasellar region within normal limits. Vascular: Major intracranial vascular flow voids maintained. Skull and upper cervical spine: Craniocervical junction within normal limits. Visualized upper cervical spine  unremarkable. Bone marrow signal intensity within normal limits. No scalp soft tissue abnormality. Sinuses/Orbits: Globes and orbital soft tissues within normal limits. Patient status post lens extraction bilaterally. Mild scattered mucosal thickening within the ethmoidal air cells and maxillary sinuses. No air-fluid level to suggest acute sinusitis. Trace opacity bilateral mastoid air cells, of doubtful significance. Inner ear structures grossly normal. Other: None. IMPRESSION: 1. No acute intracranial process. 2. Atrophy with mild chronic microvascular ischemic disease, stable. Electronically Signed   By: Jeannine Boga M.D.   On: 01/22/2017 13:10    Review of Systems  Constitutional: Positive for diaphoresis and malaise/fatigue.  HENT: Positive for congestion.   Eyes: Negative.   Respiratory: Positive for shortness of breath.   Cardiovascular: Positive for palpitations and leg swelling.  Gastrointestinal: Negative.   Genitourinary: Negative.   Musculoskeletal: Positive for myalgias.  Skin: Negative.   Neurological: Positive for loss of consciousness and weakness.  Endo/Heme/Allergies: Negative.   Psychiatric/Behavioral: Negative.    Blood pressure 126/69, pulse 90, temperature (!) 97.5 F (36.4 C), resp. rate 18, height 5' 10"  (1.778 m), weight 85.7 kg (188 lb 14.4 oz), SpO2 96 %. Physical Exam  Nursing note and vitals reviewed. Constitutional: He is oriented to person, place, and time. He appears well-developed and well-nourished.  HENT:  Head: Normocephalic and atraumatic.  Eyes: Pupils are equal, round, and reactive to light. Conjunctivae are normal.  Neck: Normal range of motion.  Cardiovascular: Normal rate and normal pulses.  An irregularly irregular rhythm present. Exam reveals gallop.   Murmur heard.  Systolic murmur is present with a grade of 2/6  GI: Soft. Bowel sounds are normal.  Musculoskeletal: Normal range of motion.  Neurological: He is alert and oriented to  person, place, and time. He has normal reflexes.  Skin: Skin is warm and dry.  Psychiatric: He has a normal mood and affect.    Assessment/Plan: Altered mental status Atrial fibrillation COPD Hyperlipidemia Hypertension Obstructive sleep apnea noncompliant GERD Congestive heart failure diastolic dysfunction Anemia Chronic renal insufficiency stage III  . Plan Agree with admission for rule out for myocardial infarction Follow-up EKGs Recommend neurology evaluation Continue rate control for atrial fibrillation Recommend anticoagulation if no contraindications with A. Fib Continue hypertension control Recommend inhalers for COPD shortness of breath Have the patient follow-up with nephrology for renal insufficiency Continue Zantac for GERD consider omeprazole Consider physical therapy Recommend sleep study CPAP indicated Recommend EEG  Amilio Zehnder D Adalis Gatti 01/22/2017, 4:19 PM

## 2017-01-22 NOTE — Progress Notes (Signed)
Dr. Ara Kussmaul made aware of 2.08 troponin level. EKG ordered. Lovenox pharmacy consult  Ordered. Pt asymptomatic. Will continue to monitor.

## 2017-01-22 NOTE — Care Management CC44 (Signed)
Condition Code 44 Documentation Completed  Patient Details  Name: Vincent Mason MRN: [id number] Date of Birth: 1935/08/13   Condition Code 44 given:  Yes Patient signature on Condition Code 44 notice:  Yes Documentation of 2 MD's agreement:  Yes Code 44 added to claim:  Yes    Katrina Stack, RN 01/22/2017, 5:08 PM

## 2017-01-22 NOTE — Progress Notes (Signed)
ANTICOAGULATION CONSULT NOTE - Initial Consult  Pharmacy Consult for heparin gtt following prior enoxaparin administration Indication: chest pain/ACS  Allergies  Allergen Reactions  . Decongestant  [Oxymetazoline]     Patient Measurements: Height: 5' 10" (177.8 cm) Weight: 188 lb 14.4 oz (85.7 kg) IBW/kg (Calculated) : 73 Heparin Dosing Weight: 85.7kg  Vital Signs: Temp: 97.5 F (36.4 C) (08/31 1114) Temp Source: Oral (08/31 0757) BP: 126/69 (08/31 1114) Pulse Rate: 90 (08/31 1114)  Labs:  Recent Labs  01/21/17 0948 01/21/17 0949 01/21/17 1656 01/21/17 2222 01/22/17 0416  HGB 10.8*  --   --   --  10.6*  HCT 32.8*  --   --   --  31.5*  PLT 170  --   --   --  171  APTT  --  25  --   --   --   LABPROT  --  13.3  --   --   --   INR  --  1.02  --   --   --   CREATININE 2.01*  --   --   --  1.82*  TROPONINI <0.03  --  0.71* 2.08* 1.72*    Estimated Creatinine Clearance: 34 mL/min (A) (by C-G formula based on SCr of 1.82 mg/dL (H)).   Medical History: Past Medical History:  Diagnosis Date  . Acute encephalopathy 09/03/2015  . Atrial fibrillation and flutter (HCC) 2013   Dr Kowalski at [facility]  on Xarelto.   . C. difficile colitis 09/12/2015  . COPD (chronic obstructive pulmonary disease) (HCC)   . HCAP (healthcare-associated pneumonia) 09/12/2015  . Hyperlipidemia   . Hypertension   . Kidney disease, chronic, stage III (moderate, EGFR 30-59 ml/min)    ARF 2013  . OSA (obstructive sleep apnea)    non-compliant with CPAP.   . Severe sepsis with septic shock (HCC) 09/12/2015    Medications:  Prescriptions Prior to Admission  Medication Sig Dispense Refill Last Dose  . ferrous sulfate 325 (65 FE) MG tablet Take 1 tablet (325 mg total) by mouth 2 (two) times daily with a meal. 60 tablet 3 Unknown at Unknown  . methocarbamol (ROBAXIN) 750 MG tablet Take 750 mg by mouth at bedtime as needed for muscle spasms.   PRN at PRN  . metolazone (ZAROXOLYN) 5 MG tablet Take 5 mg  by mouth daily as needed (swelling).    Unknown at Unknown  . metoprolol succinate (TOPROL-XL) 100 MG 24 hr tablet TAKE ONE-HALF TABLET DAILY 30 tablet 12 Unknown at Unknown  . oxyCODONE (ROXICODONE) 15 MG immediate release tablet Take 1 tablet (15 mg total) by mouth every 6 (six) hours as needed for pain. 120 tablet 0 01/21/2017 at Unknown time  . polyethylene glycol (MIRALAX / GLYCOLAX) packet Take 17 g by mouth daily. 14 each 0 Unknown at Unknown  . pramipexole (MIRAPEX) 0.25 MG tablet TAKE ONE TO TWO TABLETS BY MOUTH AT BEDTIME AS NEEDED FOR RESTLESS LEG 60 tablet 5 PRN at PRN  . ranitidine (ZANTAC) 150 MG tablet Take 150 mg by mouth 2 (two) times daily.   Unknown at Unknown  . spironolactone (ALDACTONE) 25 MG tablet Take 25 mg by mouth daily.   Unknown at Unknown  . ULORIC 80 MG TABS TAKE ONE (1) TABLET EACH DAY 30 each 12 Unknown at Unknown  . potassium chloride SA (K-DUR,KLOR-CON) 20 MEQ tablet Take 1 tablet (20 mEq total) by mouth 2 (two) times daily. (Patient not taking: Reported on 12/15/2016) 60 tablet 5 Not   Taking at Unknown time  . torsemide (DEMADEX) 20 MG tablet Take 2 tablets (40 mg total) by mouth 2 (two) times daily. (Patient not taking: Reported on 01/21/2017)   Not Taking at Unknown time   Scheduled:  . aspirin EC  81 mg Oral Daily  . chlorhexidine  15 mL Mouth Rinse BID  . mouth rinse  15 mL Mouth Rinse q12n4p  . metoprolol succinate  12.5 mg Oral Daily  . midazolam  2 mg Intravenous Once   Infusions:  . heparin     PRN: acetaminophen **OR** acetaminophen, ondansetron **OR** ondansetron (ZOFRAN) IV  Assessment: 81 year old male requiring anticoagulation with a heparin drip, pharmacy consulted. Patient had received enoxaparin 40m subq 8/31_0 . Heparin gtt without bolus [facility]    Goal of Therapy:  Heparin level 0.3-0.7 units/ml Monitor platelets by anticoagulation protocol: Yes   Plan: no bolus due to morning dose of enoxaparin  852mStart heparin infusion at 1000 units/hr  Liberato Stansbery 01/22/2017,12:17 PM

## 2017-01-22 NOTE — Progress Notes (Signed)
[place] for heparin for VTE prophylaxis   Allergies  Allergen Reactions  . Decongestant  [Oxymetazoline]     Patient Measurements: Height: 5' 10"  (177.8 cm) Weight: 188 lb 14.4 oz (85.7 kg) IBW/kg (Calculated) : 73 Heparin Dosing Weight: 85.7kg  Vital Signs: Temp: 97.5 F (36.4 C) (08/31 1114) Temp Source: Oral (08/31 0757) BP: 126/69 (08/31 1114) Pulse Rate: 90 (08/31 1114)  Labs:  Recent Labs  01/21/17 0948 01/21/17 0949 01/21/17 1656 01/21/17 2222 01/22/17 0416  HGB 10.8*  --   --   --  10.6*  HCT 32.8*  --   --   --  31.5*  PLT 170  --   --   --  171  APTT  --  25  --   --   --   LABPROT  --  13.3  --   --   --   INR  --  1.02  --   --   --   CREATININE 2.01*  --   --   --  1.82*  TROPONINI <0.03  --  0.71* 2.08* 1.72*    Estimated Creatinine Clearance: 34 mL/min (A) (by C-G formula based on SCr of 1.82 mg/dL (H)).   Medical History: Past Medical History:  Diagnosis Date  . Acute encephalopathy 09/03/2015  . Atrial fibrillation and flutter [facility]) 2013   Dr Nehemiah Massed at [facility]  on [place].   . C. difficile colitis 09/12/2015  . COPD (chronic obstructive pulmonary disease) ([place])   . HCAP (healthcare-associated pneumonia) 09/12/2015  . Hyperlipidemia   . Hypertension   . Kidney disease, chronic, stage III (moderate, EGFR 30-59 ml/min)    ARF 2013  . OSA (obstructive sleep apnea)    non-compliant with CPAP.   Marland Kitchen Severe sepsis with septic shock ([place]) 09/12/2015    Medications:  Prescriptions Prior to Admission  Medication Sig Dispense Refill Last Dose  . ferrous sulfate 325 (65 FE) MG tablet Take 1 tablet (325 mg total) by mouth 2 (two) times daily with a meal. 60 tablet 3 Unknown at Unknown  . methocarbamol (ROBAXIN) 750 MG tablet Take 750 mg by mouth at bedtime as needed for muscle spasms.   PRN at PRN  . metolazone (ZAROXOLYN) 5 MG tablet Take 5 mg by mouth daily as needed (swelling).    Unknown at Unknown  .  metoprolol succinate (TOPROL-XL) 100 MG 24 hr tablet TAKE ONE-HALF TABLET DAILY 30 tablet 12 Unknown at Unknown  . oxyCODONE (ROXICODONE) 15 MG immediate release tablet Take 1 tablet (15 mg total) by mouth every 6 (six) hours as needed for pain. 120 tablet 0 01/21/2017 at Unknown time  . polyethylene glycol (MIRALAX / GLYCOLAX) packet Take 17 g by mouth daily. 14 each 0 Unknown at Unknown  . pramipexole (MIRAPEX) 0.25 MG tablet TAKE ONE TO TWO TABLETS BY MOUTH AT BEDTIME AS NEEDED FOR RESTLESS LEG 60 tablet 5 PRN at PRN  . ranitidine (ZANTAC) 150 MG tablet Take 150 mg by mouth 2 (two) times daily.   Unknown at Unknown  . spironolactone (ALDACTONE) 25 MG tablet Take 25 mg by mouth daily.   Unknown at Unknown  . ULORIC 80 MG TABS TAKE ONE (1) TABLET EACH DAY 30 each 12 Unknown at Unknown  . potassium chloride SA (K-DUR,KLOR-CON) 20 MEQ tablet Take 1 tablet (20 mEq total) by mouth 2 (two) times daily. (Patient not taking: Reported on 12/15/2016) 60 tablet 5 Not Taking at Unknown time  .  torsemide (DEMADEX) 20 MG tablet Take 2 tablets (40 mg total) by mouth 2 (two) times daily. (Patient not taking: Reported on 01/21/2017)   Not Taking at Unknown time   Scheduled:  . aspirin EC  81 mg Oral Daily  . chlorhexidine  15 mL Mouth Rinse BID  . [START ON 01/23/2017] heparin subcutaneous  5,000 Units Subcutaneous Q8H  . mouth rinse  15 mL Mouth Rinse q12n4p  . metoprolol succinate  12.5 mg Oral Daily  . midazolam  2 mg Intravenous Once   Infusions:   PRN: acetaminophen **OR** acetaminophen, ondansetron **OR** ondansetron (ZOFRAN) IV  81 year old male requiring anticoagulation with a heparin drip, pharmacy consulted. Patient had received enoxaparin 19m subq 8/31@0330 . Heparin gtt without bolus to begin at 1530 [facility].    Plan:  Will initiate heparin 5000 units SQ Q8hr with next scheduled dose at 0800 on 9/1.  Pharmacy will continue to monitor and adjust per consult.   Simpson,Michael  L 01/22/2017,12:42 PM

## 2017-01-22 NOTE — Progress Notes (Signed)
Dr. Clayborn Bigness informed of troponin of 0.71

## 2017-01-22 NOTE — Progress Notes (Signed)
[facility] for apixaban for afib  Allergies  Allergen Reactions  . Decongestant  [Oxymetazoline]     Patient Measurements: Height: 5' 10" (177.8 cm) Weight: 188 lb 14.4 oz (85.7 kg) IBW/kg (Calculated) : 73 Heparin Dosing Weight: 85.7kg  Vital Signs: Temp: 97.5 F (36.4 C) (08/31 1114) Temp Source: Oral (08/31 0757) BP: 126/69 (08/31 1114) Pulse Rate: 90 (08/31 1114)  Labs:  Recent Labs  01/21/17 0948 01/21/17 0949 01/21/17 1656 01/21/17 2222 01/22/17 0416  HGB 10.8*  --   --   --  10.6*  HCT 32.8*  --   --   --  31.5*  PLT 170  --   --   --  171  APTT  --  25  --   --   --   LABPROT  --  13.3  --   --   --   INR  --  1.02  --   --   --   CREATININE 2.01*  --   --   --  1.82*  TROPONINI <0.03  --  0.71* 2.08* 1.72*    Estimated Creatinine Clearance: 34 mL/min (A) (by C-G formula based on SCr of 1.82 mg/dL (H)).   Medical History: Past Medical History:  Diagnosis Date  . Acute encephalopathy 09/03/2015  . Atrial fibrillation and flutter [facility]) 2013   Dr Nehemiah Massed at [facility]  on [place].   . C. difficile colitis 09/12/2015  . COPD (chronic obstructive pulmonary disease) ([place])   . HCAP (healthcare-associated pneumonia) 09/12/2015  . Hyperlipidemia   . Hypertension   . Kidney disease, chronic, stage III (moderate, EGFR 30-59 ml/min)    ARF 2013  . OSA (obstructive sleep apnea)    non-compliant with CPAP.   Marland Kitchen Severe sepsis with septic shock ([place]) 09/12/2015    Medications:  Prescriptions Prior to Admission  Medication Sig Dispense Refill Last Dose  . ferrous sulfate 325 (65 FE) MG tablet Take 1 tablet (325 mg total) by mouth 2 (two) times daily with a meal. 60 tablet 3 Unknown at Unknown  . methocarbamol (ROBAXIN) 750 MG tablet Take 750 mg by mouth at bedtime as needed for muscle spasms.   PRN at PRN  . metolazone (ZAROXOLYN) 5 MG tablet Take 5 mg by mouth daily as needed (swelling).    Unknown at Unknown  . metoprolol  succinate (TOPROL-XL) 100 MG 24 hr tablet TAKE ONE-HALF TABLET DAILY 30 tablet 12 Unknown at Unknown  . oxyCODONE (ROXICODONE) 15 MG immediate release tablet Take 1 tablet (15 mg total) by mouth every 6 (six) hours as needed for pain. 120 tablet 0 01/21/2017 at Unknown time  . polyethylene glycol (MIRALAX / GLYCOLAX) packet Take 17 g by mouth daily. 14 each 0 Unknown at Unknown  . pramipexole (MIRAPEX) 0.25 MG tablet TAKE ONE TO TWO TABLETS BY MOUTH AT BEDTIME AS NEEDED FOR RESTLESS LEG 60 tablet 5 PRN at PRN  . ranitidine (ZANTAC) 150 MG tablet Take 150 mg by mouth 2 (two) times daily.   Unknown at Unknown  . spironolactone (ALDACTONE) 25 MG tablet Take 25 mg by mouth daily.   Unknown at Unknown  . ULORIC 80 MG TABS TAKE ONE (1) TABLET EACH DAY 30 each 12 Unknown at Unknown  . potassium chloride SA (K-DUR,KLOR-CON) 20 MEQ tablet Take 1 tablet (20 mEq total) by mouth 2 (two) times daily. (Patient not taking: Reported on 12/15/2016) 60 tablet 5 Not Taking at Unknown time  . torsemide (DEMADEX)  20 MG tablet Take 2 tablets (40 mg total) by mouth 2 (two) times daily. (Patient not taking: Reported on 01/21/2017)   Not Taking at Unknown time   Scheduled:  . apixaban  5 mg Oral BID  . aspirin EC  81 mg Oral Daily  . chlorhexidine  15 mL Mouth Rinse BID  . mouth rinse  15 mL Mouth Rinse q12n4p  . metoprolol succinate  12.5 mg Oral Daily  . midazolam  2 mg Intravenous Once   Infusions:   PRN: acetaminophen **OR** acetaminophen, ondansetron **OR** ondansetron (ZOFRAN) IV  81 year old male requiring anticoagulation with a heparin drip, pharmacy consulted. Patient had received enoxaparin 52m subq 8/31_0 . Heparin gtt without bolus to begin at 1530 per [facility].    Plan:  Will initiate heparin 5000 units SQ Q8hr with next scheduled dose at 0800 on 9/1.   8/31 1439; Consult for apixaban for nonvalvular atrial fibrillation; will initiate apixaban 522mpo bid starting tonight at 2200 and  discontinue UFH.    Pharmacy will continue to monitor and adjust per consult.   Deloris Moger 01/22/2017,2:38 PM

## 2017-01-22 NOTE — Plan of Care (Signed)
Problem: Health Behavior/Discharge Planning: Goal: Ability to manage health-related needs will improve Outcome: Completed/Met Date Met: 01/22/17 He is some denial that the events of yesterday occurred.  He has no memory of what happened.  His wife is his care taker and aware of what to watch for and his medication needs.

## 2017-01-22 NOTE — Progress Notes (Signed)
Subjective: Patient much improved today.  At baseline.  Reports he is amnestic of yesterday.  Has had this happen once before.    Objective: Current vital signs: BP 126/69 (BP Location: Right Arm)   Pulse 90   Temp (!) 97.5 F (36.4 C)   Resp 18   Ht 5\' 10"  (1.778 m)   Wt 85.7 kg (188 lb 14.4 oz)   SpO2 96%   BMI 27.10 kg/m  Vital signs in last 24 hours: Temp:  [97.5 F (36.4 C)-98.2 F (36.8 C)] 97.5 F (36.4 C) (08/31 1114) Pulse Rate:  [83-123] 90 (08/31 1114) Resp:  [16-28] 18 (08/31 1114) BP: (117-165)/(55-100) 126/69 (08/31 1114) SpO2:  [96 %-100 %] 96 % (08/31 1114) Weight:  [85.7 kg (188 lb 14.4 oz)] 85.7 kg (188 lb 14.4 oz) (08/30 1712)  Intake/Output from previous day: 08/30 0701 - 08/31 0700 In: -  Out: 325 [Urine:325] Intake/Output this shift: No intake/output data recorded. Nutritional status: Diet NPO time specified  Neurologic Exam: Mental Status: Alert, oriented, thought content appropriate.  Speech fluent without evidence of aphasia.  Able to follow 3 step commands without difficulty. Cranial Nerves: II: Discs flat bilaterally; Visual fields grossly normal, pupils equal, round, reactive to light and accommodation III,IV, VI: ptosis not present, extra-ocular motions intact bilaterally V,VII: smile symmetric, facial light touch sensation normal bilaterally VIII: hearing normal bilaterally IX,X: gag reflex present XI: bilateral shoulder shrug XII: midline tongue extension Motor: Right : Upper extremity   5/5    Left:     Upper extremity   5/5  Lower extremity   5/5     Lower extremity   5/5 Tone and bulk:normal tone throughout; no atrophy noted Sensory: Pinprick and light touch intact throughout, bilaterally   Lab Results: Basic Metabolic Panel:  Recent Labs Lab 01/21/17 0948 01/22/17 0416  NA 138 142  K 3.7 3.7  CL 101 108  CO2 25 27  GLUCOSE 130* 86  BUN 32* 26*  CREATININE 2.01* 1.82*  CALCIUM 8.8* 8.5*    Liver Function  Tests:  Recent Labs Lab 01/21/17 0948 01/22/17 0416  AST 23 26  ALT 11* 11*  ALKPHOS 40 34*  BILITOT 0.7 0.8  PROT 6.8 6.0*  ALBUMIN 3.8 3.4*    Recent Labs Lab 01/21/17 0948  LIPASE 24    Recent Labs Lab 01/21/17 0949  AMMONIA 34    CBC:  Recent Labs Lab 01/21/17 0948 01/22/17 0416  WBC 6.8 6.4  NEUTROABS 5.5  --   HGB 10.8* 10.6*  HCT 32.8* 31.5*  MCV 89.7 90.4  PLT 170 171    Cardiac Enzymes:  Recent Labs Lab 01/21/17 0948 01/21/17 1656 01/21/17 2222 01/22/17 0416  TROPONINI <0.03 0.71* 2.08* 1.72*    Lipid Panel: No results for input(s): CHOL, TRIG, HDL, CHOLHDL, VLDL, LDLCALC in the last 168 hours.  CBG:  Recent Labs Lab 01/21/17 0938  GLUCAP 117*    Microbiology: Results for orders placed or performed during the hospital encounter of 12/02/16  Aerobic/Anaerobic Culture (surgical/deep wound)     Status: None   Collection Time: 12/02/16 12:53 PM  Result Value Ref Range Status   Specimen Description LEG LEFT  Final   Special Requests Normal  Final   Gram Stain   Final    RARE WBC PRESENT,BOTH PMN AND MONONUCLEAR ABUNDANT GRAM NEGATIVE RODS RARE GRAM POSITIVE COCCI IN PAIRS RARE GRAM VARIABLE ROD    Culture   Final    ABUNDANT PROVIDENCIA RETTGERI NO ANAEROBES  ISOLATED    Report Status 12/07/2016 FINAL  Final   Organism ID, Bacteria PROVIDENCIA RETTGERI  Final      Susceptibility   Providencia rettgeri - MIC*    AMPICILLIN RESISTANT Resistant     CEFAZOLIN >=64 RESISTANT Resistant     CEFEPIME <=1 SENSITIVE Sensitive     CEFTAZIDIME <=1 SENSITIVE Sensitive     CEFTRIAXONE <=1 SENSITIVE Sensitive     CIPROFLOXACIN <=0.25 SENSITIVE Sensitive     GENTAMICIN <=1 SENSITIVE Sensitive     IMIPENEM 1 SENSITIVE Sensitive     TRIMETH/SULFA <=20 SENSITIVE Sensitive     AMPICILLIN/SULBACTAM 16 INTERMEDIATE Intermediate     PIP/TAZO <=4 SENSITIVE Sensitive     * ABUNDANT PROVIDENCIA RETTGERI  MRSA PCR Screening     Status: None    Collection Time: 12/04/16 12:30 AM  Result Value Ref Range Status   MRSA by PCR NEGATIVE NEGATIVE Final    Comment:        The GeneXpert MRSA Assay (FDA approved for NASAL specimens only), is one component of a comprehensive MRSA colonization surveillance program. It is not intended to diagnose MRSA infection nor to guide or monitor treatment for MRSA infections.     Coagulation Studies:  Recent Labs  01/21/17 0949  LABPROT 13.3  INR 1.02    Imaging: Ct Angio Head W Or Wo Contrast  Result Date: 01/21/2017 CLINICAL DATA:  Encephalopathy. Altered mental status. Overdose on OxyContin. EXAM: CT ANGIOGRAPHY HEAD AND NECK TECHNIQUE: Multidetector CT imaging of the head and neck was performed using the standard protocol during bolus administration of intravenous contrast. Multiplanar CT image reconstructions and MIPs were obtained to evaluate the vascular anatomy. Carotid stenosis measurements (when applicable) are obtained utilizing NASCET criteria, using the distal internal carotid diameter as the denominator. CONTRAST:  60 cc Isovue 370 intravenous COMPARISON:  Head CT from earlier today.  Brain MRI 09/02/2016. FINDINGS: CTA NECK FINDINGS Aortic arch: Atherosclerosis.  Changes of CABG.  No acute finding. Right carotid system: Mild atherosclerotic plaque at the common carotid bifurcation. No stenosis or ulceration. Left carotid system: Mild to moderate for age calcified plaque at the common carotid bifurcation and proximal bulb. No stenosis or ulceration. Negative for dissection. Vertebral arteries: No proximal subclavian atherosclerosis. There is atheromatous plaque at both vertebral artery ostia with mild narrowing on the right. Otherwise the vessels are smooth and widely patent to the dura. Skeleton: No acute or aggressive finding. Other neck: No significant finding. Subcentimeter right thyroid nodule that is incidental based on size. Upper chest: Interstitial thickening and airway  thickening. Subpleural reticular densities in the biapical lungs, nonspecific. Mucus noted within the trachea. Review of the MIP images confirms the above findings CTA HEAD FINDINGS Anterior circulation: Diffuse atheromatous calcified plaque on the carotid siphons with mild left para clinoid segment stenosis. No major branch occlusion or proximal stenosis. Negative for aneurysm. Posterior circulation: Symmetric vertebral arteries. Unremarkable vertebrobasilar branching. Vessels are smooth and widely patent. Venous sinuses: Patent Anatomic variants: None significant Delayed phase: No abnormal intracranial enhancement. There is generalized atrophy as seen on prior noncontrast study. Review of the MIP images confirms the above findings IMPRESSION: No acute finding, including emergent large vessel occlusion. Cervical and intracranial atherosclerosis without flow limiting stenosis or ulceration. Generalized cerebral atrophy. Suspect mild interstitial edema in the apical lungs. Electronically Signed   By: Monte Fantasia M.D.   On: 01/21/2017 14:08   Dg Chest 1 View  Result Date: 01/21/2017 CLINICAL DATA:  Altered mental status. Possible  oxycodone overdose. Ex-smoker. EXAM: CHEST 1 VIEW COMPARISON:  12/02/2016. FINDINGS: Decreased inspiration. No gross change in enlargement of the cardiac silhouette with a globular configuration. Stable post CABG changes. Mild prominence of the interstitial markings, accentuated by the poor inspiration. No pleural fluid seen. IMPRESSION: No acute abnormality. Grossly stable cardiomegaly and mild chronic interstitial lung disease. The globular configuration of the heart suggests the possibility of a pericardial effusion. Electronically Signed   By: Claudie Revering M.D.   On: 01/21/2017 12:42   Ct Head Wo Contrast  Result Date: 01/21/2017 CLINICAL DATA:  Patient found unresponsive.  Suspected overdose. EXAM: CT HEAD WITHOUT CONTRAST TECHNIQUE: Contiguous axial images were obtained from  the base of the skull through the vertex without intravenous contrast. COMPARISON:  12/02/2016 FINDINGS: Brain: No evidence of acute infarction, hemorrhage, hydrocephalus, extra-axial collection or mass lesion/mass effect. Moderate brain parenchymal volume loss and periventricular microangiopathy. Vascular: Calcific atherosclerotic disease at the skullbase. Skull: Normal. Negative for fracture or focal lesion. Sinuses/Orbits: No acute finding. Other: None. IMPRESSION: No acute intracranial abnormality. Atrophy, chronic microvascular disease. Electronically Signed   By: Fidela Salisbury M.D.   On: 01/21/2017 11:29   Ct Angio Neck W And/or Wo Contrast  Result Date: 01/21/2017 CLINICAL DATA:  Encephalopathy. Altered mental status. Overdose on OxyContin. EXAM: CT ANGIOGRAPHY HEAD AND NECK TECHNIQUE: Multidetector CT imaging of the head and neck was performed using the standard protocol during bolus administration of intravenous contrast. Multiplanar CT image reconstructions and MIPs were obtained to evaluate the vascular anatomy. Carotid stenosis measurements (when applicable) are obtained utilizing NASCET criteria, using the distal internal carotid diameter as the denominator. CONTRAST:  60 cc Isovue 370 intravenous COMPARISON:  Head CT from earlier today.  Brain MRI 09/02/2016. FINDINGS: CTA NECK FINDINGS Aortic arch: Atherosclerosis.  Changes of CABG.  No acute finding. Right carotid system: Mild atherosclerotic plaque at the common carotid bifurcation. No stenosis or ulceration. Left carotid system: Mild to moderate for age calcified plaque at the common carotid bifurcation and proximal bulb. No stenosis or ulceration. Negative for dissection. Vertebral arteries: No proximal subclavian atherosclerosis. There is atheromatous plaque at both vertebral artery ostia with mild narrowing on the right. Otherwise the vessels are smooth and widely patent to the dura. Skeleton: No acute or aggressive finding. Other  neck: No significant finding. Subcentimeter right thyroid nodule that is incidental based on size. Upper chest: Interstitial thickening and airway thickening. Subpleural reticular densities in the biapical lungs, nonspecific. Mucus noted within the trachea. Review of the MIP images confirms the above findings CTA HEAD FINDINGS Anterior circulation: Diffuse atheromatous calcified plaque on the carotid siphons with mild left para clinoid segment stenosis. No major branch occlusion or proximal stenosis. Negative for aneurysm. Posterior circulation: Symmetric vertebral arteries. Unremarkable vertebrobasilar branching. Vessels are smooth and widely patent. Venous sinuses: Patent Anatomic variants: None significant Delayed phase: No abnormal intracranial enhancement. There is generalized atrophy as seen on prior noncontrast study. Review of the MIP images confirms the above findings IMPRESSION: No acute finding, including emergent large vessel occlusion. Cervical and intracranial atherosclerosis without flow limiting stenosis or ulceration. Generalized cerebral atrophy. Suspect mild interstitial edema in the apical lungs. Electronically Signed   By: Monte Fantasia M.D.   On: 01/21/2017 14:08    Medications:  I have reviewed the patient's current medications. Scheduled: . chlorhexidine  15 mL Mouth Rinse BID  . enoxaparin (LOVENOX) injection  85 mg Subcutaneous Q12H  . mouth rinse  15 mL Mouth Rinse q12n4p  . midazolam  2 mg Intravenous Once    Assessment/Plan: Patient back to baseline.  Work up pending but should be easier to perform now that patient more cooperative.  MR of the brain and EEG ordered.  Echocardiogram shows no cardiac source of emboli with an EF of 45-50%.    Will continue to follow with you.      LOS: 1 day   Alexis Goodell, MD Neurology [phone] 01/22/2017  11:51 AM

## 2017-01-22 NOTE — Discharge Summary (Signed)
[facility] NAME: Vincent Mason    MR#:  [id number]  DATE OF BIRTH:  Jul 01, 1935  DATE OF ADMISSION:  01/21/2017 ADMITTING PHYSICIAN: Nicholes Mango, MD  DATE OF DISCHARGE: 01/22/2017  PRIMARY CARE PHYSICIAN: Birdie Sons, MD    ADMISSION DIAGNOSIS:  Opiate overdose, undetermined intent, initial encounter [T40.604A] Altered mental status, unspecified altered mental status type [R41.82]  DISCHARGE DIAGNOSIS:  Active Problems:   Altered mental status   SECONDARY DIAGNOSIS:   Past Medical History:  Diagnosis Date  . Acute encephalopathy 09/03/2015  . Atrial fibrillation and flutter [facility]) 2013   Dr Nehemiah Massed at [facility]  on [address].   . C. difficile colitis 09/12/2015  . COPD (chronic obstructive pulmonary disease) ([place])   . HCAP (healthcare-associated pneumonia) 09/12/2015  . Hyperlipidemia   . Hypertension   . Kidney disease, chronic, stage III (moderate, EGFR 30-59 ml/min)    ARF 2013  . OSA (obstructive sleep apnea)    non-compliant with CPAP.   Marland Kitchen Severe sepsis with septic shock ([place]) 09/12/2015    HOSPITAL COURSE:   Vincent Mason  is a 81 y.o. male with a known history of chronic atrial fibrillation, COPD, hyperlipidemia, hypertension, chronic kidney disease and multiple other medical problems is brought into the ED for altered mental status.as reported by the family to EMS patient was vomiting yesterday evening. Unable to awaken him this morning and EMS was called  # ACUTE ENCEPHALOPATHY-unclear etiology? TIA?   urine drug screen is positive for benzos/post ictal as patient has history of seizures Telemetry Neuro checks Neurology consult appreciated. Done EEG Patient is maintaining airway and hemodynamically stable Ativan as needed for breakthrough seizures, did not have any seizures. MRI brain- no acute strokes. Echo- no source of emboli. CT angio- neck and head, no acute blockages. Pt is completely alert and  oriented, back to baseline.  # Chest x-ray with possible pericardial effusion Stat echocardiogram ordered- no effusion on Echo. Cardiology consult is placed and discussed with [place] No pericardial rub on examination  I spoke to Dr. Clayborn Bigness on phone twice- he had seen the pt earlier and reviewed the chart, but did not do his note yet. As per him on phone- " No effusion on Echo, Troponin elevated is due to demand ischemia in renal failure- stress test was negative 2 weeks ago- so suggested to stop treating as NSTEMI with lovenox or IV heparin and d/c without further interventions. Add Eliquis for A fib."  # Elevated troponin   Demand ischemia and renal failure as per cardio.   No need to give therapeutic lovenox or heparin drip.   Negative stress test 2 weeks ago as out pt.   No further work ups as per Dr. Clayborn Bigness.  #elevated TSH and subtherapeutic T4 Patient does not have any history of hypothyroidism, not on thyroid meds Levothyroxine 25 g IV was ordered once Will check T3 levels in a.m.  #Chronic atrial fibrillation Currently rate controlled Cardiology consult placed to [facility] cardiology group He suggested to start eliquis.  #chronic kidney disease stage III Avoid nephrotoxins and monitor renal function, provide IV fluids  GI and DVT prophylaxis  DISCHARGE CONDITIONS:   Stable.  CONSULTS OBTAINED:  Treatment Team:  Catarina Hartshorn, MD [name], MD Alexis Goodell, MD Yolonda Kida, MD  DRUG ALLERGIES:   Allergies  Allergen Reactions  . Decongestant  [Oxymetazoline]     DISCHARGE MEDICATIONS:   Current Discharge Medication List  START taking these medications   Details  apixaban (ELIQUIS) 5 MG TABS tablet Take 1 tablet (5 mg total) by mouth 2 (two) times daily. Qty: 60 tablet, Refills: 0    aspirin EC 81 MG EC tablet Take 1 tablet (81 mg total) by mouth daily. Qty: 30 tablet, Refills: 0      CONTINUE these medications which  have CHANGED   Details  metoprolol succinate (TOPROL-XL) 25 MG 24 hr tablet Take 0.5 tablets (12.5 mg total) by mouth daily. Qty: 30 tablet, Refills: 0      CONTINUE these medications which have NOT CHANGED   Details  ferrous sulfate 325 (65 FE) MG tablet Take 1 tablet (325 mg total) by mouth 2 (two) times daily with a meal. Qty: 60 tablet, Refills: 3    methocarbamol (ROBAXIN) 750 MG tablet Take 750 mg by mouth at bedtime as needed for muscle spasms.    metolazone (ZAROXOLYN) 5 MG tablet Take 5 mg by mouth daily as needed (swelling).     oxyCODONE (ROXICODONE) 15 MG immediate release tablet Take 1 tablet (15 mg total) by mouth every 6 (six) hours as needed for pain. Qty: 120 tablet, Refills: 0    polyethylene glycol (MIRALAX / GLYCOLAX) packet Take 17 g by mouth daily. Qty: 14 each, Refills: 0    pramipexole (MIRAPEX) 0.25 MG tablet TAKE ONE TO TWO TABLETS BY MOUTH AT BEDTIME AS NEEDED FOR RESTLESS LEG Qty: 60 tablet, Refills: 5    ranitidine (ZANTAC) 150 MG tablet Take 150 mg by mouth 2 (two) times daily.    ULORIC 80 MG TABS TAKE ONE (1) TABLET EACH DAY Qty: 30 each, Refills: 12      STOP taking these medications     spironolactone (ALDACTONE) 25 MG tablet      potassium chloride SA (K-DUR,KLOR-CON) 20 MEQ tablet      torsemide (DEMADEX) 20 MG tablet          DISCHARGE INSTRUCTIONS:    Follow with PMD in 1-2 weeks.  If you experience worsening of your admission symptoms, develop shortness of breath, life threatening emergency, suicidal or homicidal thoughts you must seek medical attention immediately by calling 911 or calling your MD immediately  if symptoms less severe.  You Must read complete instructions/literature along with all the possible adverse reactions/side effects for all the Medicines you take and that have been prescribed to you. Take any new Medicines after you have completely understood and accept all the possible adverse reactions/side effects.    Please note  You were cared for by a hospitalist during your hospital stay. If you have any questions about your discharge medications or the care you received while you were in the hospital after you are discharged, you can call the unit and asked to speak with the hospitalist on call if the hospitalist that took care of you is not available. Once you are discharged, your primary care physician will handle any further medical issues. Please note that NO REFILLS for any discharge medications will be authorized once you are discharged, as it is imperative that you return to your primary care physician (or establish a relationship with a primary care physician if you do not have one) for your aftercare needs so that they can reassess your need for medications and monitor your lab values.    Today   CHIEF COMPLAINT:   Chief Complaint  Patient presents with  . Drug Overdose    HISTORY OF PRESENT ILLNESS:  Vincent Mason  is a 81 y.o. male with a known history of chronic atrial fibrillation, COPD, hyperlipidemia, hypertension, chronic kidney disease and multiple other medical problems is brought into the ED for altered mental status.as reported by the family to EMS patient was vomiting yesterday evening. Unable to awaken him this morning and EMS was called.Patient was given intranasal Narcan at bedside by the EMS as he has long-standing history of oxycodone use for leg pain subsequently patient was agitated. CT head is negative and hospitalist team is called to admit the patient. During my examination patient has pinpoint pupils, spontaneously moving his extremities, no family at the bedside   VITAL SIGNS:  Blood pressure 126/69, pulse 90, temperature (!) 97.5 F (36.4 C), resp. rate 18, height 5' 10"  (1.778 m), weight 85.7 kg (188 lb 14.4 oz), SpO2 96 %.  I/O:   Intake/Output Summary (Last 24 hours) at 01/22/17 1514 Last data filed at 01/21/17 2100  Gross per 24 hour  Intake                0  ml  Output              325 ml  Net             -325 ml    PHYSICAL EXAMINATION:  GENERAL:  81 y.o.-year-old patient lying in the bed with no acute distress.  EYES: Pupils equal, round, reactive to light and accommodation. No scleral icterus. Extraocular muscles intact.  HEENT: Head atraumatic, normocephalic. Oropharynx and nasopharynx clear.  NECK:  Supple, no jugular venous distention. No thyroid enlargement, no tenderness.  LUNGS: Normal breath sounds bilaterally, no wheezing, rales,rhonchi or crepitation. No use of accessory muscles of respiration.  CARDIOVASCULAR: S1, S2 normal. No murmurs, rubs, or gallops.  ABDOMEN: Soft, non-tender, non-distended. Bowel sounds present. No organomegaly or mass.  EXTREMITIES: No pedal edema, cyanosis, or clubbing.  NEUROLOGIC: Cranial nerves II through XII are intact. Muscle strength 5/5 in all extremities. Sensation intact. Gait not checked.  PSYCHIATRIC: The patient is alert and oriented x 3.  SKIN: No obvious rash, lesion, or ulcer.   DATA REVIEW:   CBC  Recent Labs Lab 01/22/17 0416  WBC 6.4  HGB 10.6*  HCT 31.5*  PLT 171    Chemistries   Recent Labs Lab 01/22/17 0416  NA 142  K 3.7  CL 108  CO2 27  GLUCOSE 86  BUN 26*  CREATININE 1.82*  CALCIUM 8.5*  AST 26  ALT 11*  ALKPHOS 34*  BILITOT 0.8    Cardiac Enzymes  Recent Labs Lab 01/22/17 0416  TROPONINI 1.72*    Microbiology Results  Results for orders placed or performed during the hospital encounter of 12/02/16  Aerobic/Anaerobic Culture (surgical/deep wound)     Status: None   Collection Time: 12/02/16 12:53 PM  Result Value Ref Range Status   Specimen Description LEG LEFT  Final   Special Requests Normal  Final   Gram Stain   Final    RARE WBC PRESENT,BOTH PMN AND MONONUCLEAR ABUNDANT GRAM NEGATIVE RODS RARE GRAM POSITIVE COCCI IN PAIRS RARE GRAM VARIABLE ROD    Culture   Final    ABUNDANT PROVIDENCIA RETTGERI NO ANAEROBES ISOLATED    Report  Status 12/07/2016 FINAL  Final   Organism ID, Bacteria PROVIDENCIA RETTGERI  Final      Susceptibility   Providencia rettgeri - MIC*    AMPICILLIN RESISTANT Resistant     CEFAZOLIN >=64 RESISTANT Resistant     CEFEPIME <=1  SENSITIVE Sensitive     CEFTAZIDIME <=1 SENSITIVE Sensitive     CEFTRIAXONE <=1 SENSITIVE Sensitive     CIPROFLOXACIN <=0.25 SENSITIVE Sensitive     GENTAMICIN <=1 SENSITIVE Sensitive     IMIPENEM 1 SENSITIVE Sensitive     TRIMETH/SULFA <=20 SENSITIVE Sensitive     AMPICILLIN/SULBACTAM 16 INTERMEDIATE Intermediate     PIP/TAZO <=4 SENSITIVE Sensitive     * ABUNDANT PROVIDENCIA RETTGERI  MRSA PCR Screening     Status: None   Collection Time: 12/04/16 12:30 AM  Result Value Ref Range Status   MRSA by PCR NEGATIVE NEGATIVE Final    Comment:        The GeneXpert MRSA Assay (FDA approved for NASAL specimens only), is one component of a comprehensive MRSA colonization surveillance program. It is not intended to diagnose MRSA infection nor to guide or monitor treatment for MRSA infections.     RADIOLOGY:  Ct Angio Head W Or Wo Contrast  Result Date: 01/21/2017 CLINICAL DATA:  Encephalopathy. Altered mental status. Overdose on OxyContin. EXAM: CT ANGIOGRAPHY HEAD AND NECK TECHNIQUE: Multidetector CT imaging of the head and neck was performed using the standard protocol during bolus administration of intravenous contrast. Multiplanar CT image reconstructions and MIPs were obtained to evaluate the vascular anatomy. Carotid stenosis measurements (when applicable) are obtained utilizing NASCET criteria, using the distal internal carotid diameter as the denominator. CONTRAST:  60 cc Isovue 370 intravenous COMPARISON:  Head CT from earlier today.  Brain MRI 09/02/2016. FINDINGS: CTA NECK FINDINGS Aortic arch: Atherosclerosis.  Changes of CABG.  No acute finding. Right carotid system: Mild atherosclerotic plaque at the common carotid bifurcation. No stenosis or ulceration.  Left carotid system: Mild to moderate for age calcified plaque at the common carotid bifurcation and proximal bulb. No stenosis or ulceration. Negative for dissection. Vertebral arteries: No proximal subclavian atherosclerosis. There is atheromatous plaque at both vertebral artery ostia with mild narrowing on the right. Otherwise the vessels are smooth and widely patent to the dura. Skeleton: No acute or aggressive finding. Other neck: No significant finding. Subcentimeter right thyroid nodule that is incidental based on size. Upper chest: Interstitial thickening and airway thickening. Subpleural reticular densities in the biapical lungs, nonspecific. Mucus noted within the trachea. Review of the MIP images confirms the above findings CTA HEAD FINDINGS Anterior circulation: Diffuse atheromatous calcified plaque on the carotid siphons with mild left para clinoid segment stenosis. No major branch occlusion or proximal stenosis. Negative for aneurysm. Posterior circulation: Symmetric vertebral arteries. Unremarkable vertebrobasilar branching. Vessels are smooth and widely patent. Venous sinuses: Patent Anatomic variants: None significant Delayed phase: No abnormal intracranial enhancement. There is generalized atrophy as seen on prior noncontrast study. Review of the MIP images confirms the above findings IMPRESSION: No acute finding, including emergent large vessel occlusion. Cervical and intracranial atherosclerosis without flow limiting stenosis or ulceration. Generalized cerebral atrophy. Suspect mild interstitial edema in the apical lungs. Electronically Signed   By: Monte Fantasia M.D.   On: 01/21/2017 14:08   Dg Chest 1 View  Result Date: 01/21/2017 CLINICAL DATA:  Altered mental status. Possible oxycodone overdose. Ex-smoker. EXAM: CHEST 1 VIEW COMPARISON:  12/02/2016. FINDINGS: Decreased inspiration. No gross change in enlargement of the cardiac silhouette with a globular configuration. Stable post CABG  changes. Mild prominence of the interstitial markings, accentuated by the poor inspiration. No pleural fluid seen. IMPRESSION: No acute abnormality. Grossly stable cardiomegaly and mild chronic interstitial lung disease. The globular configuration of the heart suggests the possibility  of a pericardial effusion. Electronically Signed   By: Claudie Revering M.D.   On: 01/21/2017 12:42   Ct Head Wo Contrast  Result Date: 01/21/2017 CLINICAL DATA:  Patient found unresponsive.  Suspected overdose. EXAM: CT HEAD WITHOUT CONTRAST TECHNIQUE: Contiguous axial images were obtained from the base of the skull through the vertex without intravenous contrast. COMPARISON:  12/02/2016 FINDINGS: Brain: No evidence of acute infarction, hemorrhage, hydrocephalus, extra-axial collection or mass lesion/mass effect. Moderate brain parenchymal volume loss and periventricular microangiopathy. Vascular: Calcific atherosclerotic disease at the skullbase. Skull: Normal. Negative for fracture or focal lesion. Sinuses/Orbits: No acute finding. Other: None. IMPRESSION: No acute intracranial abnormality. Atrophy, chronic microvascular disease. Electronically Signed   By: Fidela Salisbury M.D.   On: 01/21/2017 11:29   Ct Angio Neck W And/or Wo Contrast  Result Date: 01/21/2017 CLINICAL DATA:  Encephalopathy. Altered mental status. Overdose on OxyContin. EXAM: CT ANGIOGRAPHY HEAD AND NECK TECHNIQUE: Multidetector CT imaging of the head and neck was performed using the standard protocol during bolus administration of intravenous contrast. Multiplanar CT image reconstructions and MIPs were obtained to evaluate the vascular anatomy. Carotid stenosis measurements (when applicable) are obtained utilizing NASCET criteria, using the distal internal carotid diameter as the denominator. CONTRAST:  60 cc Isovue 370 intravenous COMPARISON:  Head CT from earlier today.  Brain MRI 09/02/2016. FINDINGS: CTA NECK FINDINGS Aortic arch: Atherosclerosis.   Changes of CABG.  No acute finding. Right carotid system: Mild atherosclerotic plaque at the common carotid bifurcation. No stenosis or ulceration. Left carotid system: Mild to moderate for age calcified plaque at the common carotid bifurcation and proximal bulb. No stenosis or ulceration. Negative for dissection. Vertebral arteries: No proximal subclavian atherosclerosis. There is atheromatous plaque at both vertebral artery ostia with mild narrowing on the right. Otherwise the vessels are smooth and widely patent to the dura. Skeleton: No acute or aggressive finding. Other neck: No significant finding. Subcentimeter right thyroid nodule that is incidental based on size. Upper chest: Interstitial thickening and airway thickening. Subpleural reticular densities in the biapical lungs, nonspecific. Mucus noted within the trachea. Review of the MIP images confirms the above findings CTA HEAD FINDINGS Anterior circulation: Diffuse atheromatous calcified plaque on the carotid siphons with mild left para clinoid segment stenosis. No major branch occlusion or proximal stenosis. Negative for aneurysm. Posterior circulation: Symmetric vertebral arteries. Unremarkable vertebrobasilar branching. Vessels are smooth and widely patent. Venous sinuses: Patent Anatomic variants: None significant Delayed phase: No abnormal intracranial enhancement. There is generalized atrophy as seen on prior noncontrast study. Review of the MIP images confirms the above findings IMPRESSION: No acute finding, including emergent large vessel occlusion. Cervical and intracranial atherosclerosis without flow limiting stenosis or ulceration. Generalized cerebral atrophy. Suspect mild interstitial edema in the apical lungs. Electronically Signed   By: Monte Fantasia M.D.   On: 01/21/2017 14:08   Mr Brain Wo Contrast  Result Date: 01/22/2017 CLINICAL DATA:  Initial evaluation for acute altered mental status, of unknown etiology. EXAM: MRI HEAD  WITHOUT CONTRAST TECHNIQUE: Multiplanar, multiecho pulse sequences of the brain and surrounding structures were obtained without intravenous contrast. COMPARISON:  Comparison made with prior CT from 01/21/2017. FINDINGS: Brain: Diffuse prominence of the CSF containing spaces compatible with generalized age-related cerebral atrophy. Minimal chronic microvascular ischemic disease, stable from previous. No evidence for acute or subacute infarct. Gray-white matter differentiation maintained. No encephalomalacia to suggest chronic infarction. No evidence for acute or chronic intracranial hemorrhage. No mass lesion, midline shift or mass effect. Mild ventricular  prominence related global parenchymal volume loss of hydrocephalus. No extra-axial fluid collection. Pituitary gland suprasellar region within normal limits. Vascular: Major intracranial vascular flow voids maintained. Skull and upper cervical spine: Craniocervical junction within normal limits. Visualized upper cervical spine unremarkable. Bone marrow signal intensity within normal limits. No scalp soft tissue abnormality. Sinuses/Orbits: Globes and orbital soft tissues within normal limits. Patient status post lens extraction bilaterally. Mild scattered mucosal thickening within the ethmoidal air cells and maxillary sinuses. No air-fluid level to suggest acute sinusitis. Trace opacity bilateral mastoid air cells, of doubtful significance. Inner ear structures grossly normal. Other: None. IMPRESSION: 1. No acute intracranial process. 2. Atrophy with mild chronic microvascular ischemic disease, stable. Electronically Signed   By: Jeannine Boga M.D.   On: 01/22/2017 13:10    EKG:   Orders placed or performed during the hospital encounter of 01/21/17  . ED EKG  . ED EKG  . EKG 12-Lead  . EKG 12-Lead  . EKG 12-Lead  . EKG 12-Lead      Management plans discussed with the patient, family and they are in agreement.  CODE STATUS:     Code  Status Orders        Start     Ordered   01/21/17 1629  Full code  Continuous     01/21/17 1628    Code Status History    Date Active Date Inactive Code Status Order ID Comments User Context   12/02/2016  9:37 AM 12/07/2016  8:14 PM Full Code [id number]  Loletha Grayer, MD ED   09/03/2015  5:58 AM 09/17/2015  4:37 PM Full Code [id number]  Lucious Groves, DO Inpatient   01/17/2015 10:56 AM 01/18/2015  1:52 PM Full Code [id number]  Theodoro Grist, MD ED      TOTAL TIME TAKING CARE OF THIS PATIENT: 35 minutes.    [name] M.D on 01/22/2017 at 3:14 PM  Between 7am to 6pm - Pager - [phone]  After 6pm go to [email or web address] - password [email or web address] [facility]  Office  [phone]  CC: Primary care physician; Birdie Sons, MD   Note: This dictation was prepared with Dragon dictation along with smaller phrase technology. Any transcriptional errors that result from this process are unintentional.

## 2017-01-22 NOTE — Care Management (Signed)
CM informed that patient is discharging home on Eliquis.  CM provided the 30 day trial coupon.  Patient's wife [name] says that she thinks patient has been on this before.  There is documentation that patient has been on Xarelto in the past but patient does not recall this medication, nor does his wife

## 2017-01-23 DIAGNOSIS — I482 Chronic atrial fibrillation: Secondary | ICD-10-CM | POA: Diagnosis not present

## 2017-01-23 DIAGNOSIS — R4182 Altered mental status, unspecified: Secondary | ICD-10-CM | POA: Diagnosis not present

## 2017-01-23 LAB — T3: T3, Total: 56 ng/dL — ABNORMAL LOW (ref 71–180)

## 2017-01-24 DIAGNOSIS — I482 Chronic atrial fibrillation: Secondary | ICD-10-CM | POA: Diagnosis not present

## 2017-01-24 DIAGNOSIS — R4182 Altered mental status, unspecified: Secondary | ICD-10-CM | POA: Diagnosis not present

## 2017-01-27 ENCOUNTER — Other Ambulatory Visit: Payer: Self-pay | Admitting: *Deleted

## 2017-01-27 DIAGNOSIS — I872 Venous insufficiency (chronic) (peripheral): Secondary | ICD-10-CM | POA: Diagnosis not present

## 2017-01-27 DIAGNOSIS — M6282 Rhabdomyolysis: Secondary | ICD-10-CM | POA: Diagnosis not present

## 2017-01-27 DIAGNOSIS — I129 Hypertensive chronic kidney disease with stage 1 through stage 4 chronic kidney disease, or unspecified chronic kidney disease: Secondary | ICD-10-CM | POA: Diagnosis not present

## 2017-01-27 DIAGNOSIS — N183 Chronic kidney disease, stage 3 (moderate): Secondary | ICD-10-CM | POA: Diagnosis not present

## 2017-01-27 DIAGNOSIS — L97821 Non-pressure chronic ulcer of other part of left lower leg limited to breakdown of skin: Secondary | ICD-10-CM | POA: Diagnosis not present

## 2017-01-27 DIAGNOSIS — R238 Other skin changes: Secondary | ICD-10-CM | POA: Diagnosis not present

## 2017-01-27 NOTE — Patient Outreach (Signed)
Successful telephone encounter to Vincent Mason, 81 year old male for follow up on recent hospital observation stay August 30-31,2018 for Altered Mental status.   Spoke with pt, HIPAA identifiers verified, discussed purpose of call/follow up on recent observation stay.  Pt reports he is fine now, reason for observation stay- could not get awake, so spouse called 911.   Pt reports Home health no longer doing wound care to bilateral legs/no redness,swelling, drainage.  RN CM discussed follow up with PCP 9/7 as well as follow up for MRI/Neurology to which spouse takes care of that.   Pt reports spouse continues to do pill planner to which RN CM requested to speak to about new medication (Eliquis) started post hospital stay.   Spoke with spouse [name] who confirms pt has Eliquis, taking as directed, continues to do his pill planner.      Plan:  As discussed with spouse to relay to pt plan to follow up again in next 2 weeks telephonically- check on status.    [name]   [facility]  [phone]

## 2017-01-29 DIAGNOSIS — M6282 Rhabdomyolysis: Secondary | ICD-10-CM | POA: Diagnosis not present

## 2017-01-29 DIAGNOSIS — R238 Other skin changes: Secondary | ICD-10-CM | POA: Diagnosis not present

## 2017-01-29 DIAGNOSIS — L97821 Non-pressure chronic ulcer of other part of left lower leg limited to breakdown of skin: Secondary | ICD-10-CM | POA: Diagnosis not present

## 2017-01-29 DIAGNOSIS — I129 Hypertensive chronic kidney disease with stage 1 through stage 4 chronic kidney disease, or unspecified chronic kidney disease: Secondary | ICD-10-CM | POA: Diagnosis not present

## 2017-01-29 DIAGNOSIS — I872 Venous insufficiency (chronic) (peripheral): Secondary | ICD-10-CM | POA: Diagnosis not present

## 2017-01-29 DIAGNOSIS — N183 Chronic kidney disease, stage 3 (moderate): Secondary | ICD-10-CM | POA: Diagnosis not present

## 2017-02-09 ENCOUNTER — Other Ambulatory Visit: Payer: Self-pay | Admitting: Family Medicine

## 2017-02-09 DIAGNOSIS — N184 Chronic kidney disease, stage 4 (severe): Secondary | ICD-10-CM | POA: Diagnosis not present

## 2017-02-09 DIAGNOSIS — I272 Pulmonary hypertension, unspecified: Secondary | ICD-10-CM | POA: Diagnosis not present

## 2017-02-09 DIAGNOSIS — R609 Edema, unspecified: Secondary | ICD-10-CM | POA: Diagnosis not present

## 2017-02-09 NOTE — Telephone Encounter (Signed)
Pt contacted office for refill request on the following medications:  oxyCODONE (ROXICODONE) 15 MG immediate release tablet  CB#[phone]/MW

## 2017-02-10 ENCOUNTER — Other Ambulatory Visit: Payer: Self-pay | Admitting: *Deleted

## 2017-02-10 ENCOUNTER — Encounter: Payer: Self-pay | Admitting: *Deleted

## 2017-02-10 MED ORDER — OXYCODONE HCL 15 MG PO TABS: 15.0000 mg | ORAL_TABLET | Freq: Four times a day (QID) | ORAL | 0 refills | Status: DC | PRN

## 2017-02-10 NOTE — Patient Outreach (Signed)
Successful telephone encounter to [name], 81 year old male- follow up on status as this RN CM followed pt for transition of care/hospitalization July 11-16,2018 for acute kidney injury, anemia due to chronic blood loss/transition of care program completed 01/11/17.    Spoke with pt, HIPAA identifiers verified.  Pt reports legs are back to normal, no edema, no pain, BP good, no dizziness.  Pt reports on recent visit with PCP- everything was good.   RN CM discussed with pt plan to discharge from [facility]- goals met, to call RN CM/contact PCP  if needs arise in the future to which pt voiced understanding.    Plan:  As discussed with pt, plan to discharge from [facility] CM services/close case.           Care plan completed.            Plan to send Dr. Caryn Section case closure letter.            Plan to inform [facility] care management assistant to close case.   [name]   [facility]  [phone]

## 2017-02-15 NOTE — Progress Notes (Signed)
Subjective:  Patient seen and evaluated September 1 still in intensive care unit Intubated sedated  Objective:  Vital Signs in the last 24 hours:    Intake/Output from previous day: No intake/output data recorded. Intake/Output from this shift: No intake/output data recorded.  Physical Exam: General appearance: Intubated sedated and unresponsive Neck: no adenopathy, no carotid bruit, no JVD, supple, symmetrical, trachea midline and thyroid not enlarged, symmetric, no tenderness/mass/nodules Lungs: diminished breath sounds bibasilar Heart: irregularly irregular rhythm Abdomen: soft, non-tender; bowel sounds normal; no masses,  no organomegaly Extremities: extremities normal, atraumatic, no cyanosis or edema Pulses: 2+ and symmetric Skin: Skin color, texture, turgor normal. No rashes or lesions Neurologic: Mental status: Alert, oriented, thought content appropriate, Intubated sedated  Lab Results: No results for input(s): WBC, HGB, PLT in the last 72 hours. No results for input(s): NA, K, CL, CO2, GLUCOSE, BUN, CREATININE in the last 72 hours. No results for input(s): TROPONINI in the last 72 hours.  Invalid input(s): CK, MB Hepatic Function Panel No results for input(s): PROT, ALBUMIN, AST, ALT, ALKPHOS, BILITOT, BILIDIR, IBILI in the last 72 hours. No results for input(s): CHOL in the last 72 hours. No results for input(s): PROTIME in the last 72 hours.  Imaging: Imaging results have been reviewed  Cardiac Studies:  Assessment/Plan:  Arrhythmia Atrial Fibrillation Cardiomyopathy CHF Coronary Artery Disease Edema Ischemic Heart Disease Palpitations Shortness of Breath Syncope  . Plan Altered mental status continue current therapy History failure continue pulmonary evaluation and therapy End-stage renal disease Peripheral vascular disease status post AKA Rate control for atrial fibrillation Anticoagulation for atrial fibrillation short-term Echocardiogram  helpful for left ventricular function  LOS: 0 days    Vincent Mason 02/15/2017, 5:16 PM

## 2017-02-22 ENCOUNTER — Encounter (INDEPENDENT_AMBULATORY_CARE_PROVIDER_SITE_OTHER): Payer: Medicare Other | Admitting: Family Medicine

## 2017-02-22 ENCOUNTER — Encounter: Payer: Self-pay | Admitting: Family Medicine

## 2017-02-22 DIAGNOSIS — L97821 Non-pressure chronic ulcer of other part of left lower leg limited to breakdown of skin: Secondary | ICD-10-CM

## 2017-02-22 DIAGNOSIS — I872 Venous insufficiency (chronic) (peripheral): Secondary | ICD-10-CM

## 2017-02-22 DIAGNOSIS — M6282 Rhabdomyolysis: Secondary | ICD-10-CM

## 2017-02-22 DIAGNOSIS — R238 Other skin changes: Secondary | ICD-10-CM

## 2017-03-05 ENCOUNTER — Encounter (INDEPENDENT_AMBULATORY_CARE_PROVIDER_SITE_OTHER): Payer: Medicare Other

## 2017-03-05 ENCOUNTER — Ambulatory Visit (INDEPENDENT_AMBULATORY_CARE_PROVIDER_SITE_OTHER): Payer: Medicare Other | Admitting: Vascular Surgery

## 2017-03-09 ENCOUNTER — Other Ambulatory Visit: Payer: Self-pay | Admitting: Family Medicine

## 2017-03-15 ENCOUNTER — Other Ambulatory Visit: Payer: Self-pay | Admitting: Family Medicine

## 2017-03-15 MED ORDER — OXYCODONE HCL 15 MG PO TABS: 15.0000 mg | ORAL_TABLET | Freq: Four times a day (QID) | ORAL | 0 refills | Status: DC | PRN

## 2017-03-15 NOTE — Telephone Encounter (Signed)
Pt contacted office for refill request on the following medications:  oxyCODONE (ROXICODONE) 15 MG immediate release tablet  Last Rx: 02/10/17 LOV: 10/14/16  Please advise. Thanks TNP

## 2017-03-15 NOTE — Telephone Encounter (Signed)
No answer and unable to leave message

## 2017-03-16 NOTE — Telephone Encounter (Signed)
Left detailed message on pt's vm. Okay per dpr.  

## 2017-03-23 DIAGNOSIS — N183 Chronic kidney disease, stage 3 (moderate): Secondary | ICD-10-CM | POA: Diagnosis not present

## 2017-03-23 DIAGNOSIS — I10 Essential (primary) hypertension: Secondary | ICD-10-CM | POA: Diagnosis not present

## 2017-03-23 DIAGNOSIS — R601 Generalized edema: Secondary | ICD-10-CM | POA: Diagnosis not present

## 2017-03-23 DIAGNOSIS — I272 Pulmonary hypertension, unspecified: Secondary | ICD-10-CM | POA: Diagnosis not present

## 2017-03-23 DIAGNOSIS — N184 Chronic kidney disease, stage 4 (severe): Secondary | ICD-10-CM | POA: Diagnosis not present

## 2017-04-08 ENCOUNTER — Ambulatory Visit (INDEPENDENT_AMBULATORY_CARE_PROVIDER_SITE_OTHER): Payer: Medicare Other | Admitting: Family Medicine

## 2017-04-08 ENCOUNTER — Encounter: Payer: Self-pay | Admitting: Family Medicine

## 2017-04-08 VITALS — BP 108/72 | HR 52 | Temp 97.6°F | Resp 16 | Ht 70.0 in | Wt 189.0 lb

## 2017-04-08 DIAGNOSIS — I272 Pulmonary hypertension, unspecified: Secondary | ICD-10-CM

## 2017-04-08 DIAGNOSIS — M5136 Other intervertebral disc degeneration, lumbar region: Secondary | ICD-10-CM

## 2017-04-08 DIAGNOSIS — G629 Polyneuropathy, unspecified: Secondary | ICD-10-CM | POA: Diagnosis not present

## 2017-04-08 DIAGNOSIS — N183 Chronic kidney disease, stage 3 unspecified: Secondary | ICD-10-CM

## 2017-04-08 DIAGNOSIS — Z23 Encounter for immunization: Secondary | ICD-10-CM | POA: Diagnosis not present

## 2017-04-08 DIAGNOSIS — E559 Vitamin D deficiency, unspecified: Secondary | ICD-10-CM | POA: Diagnosis not present

## 2017-04-08 MED ORDER — VITAMIN D (ERGOCALCIFEROL) 1.25 MG (50000 UNIT) PO CAPS
50000.0000 [IU] | ORAL_CAPSULE | ORAL | 1 refills | Status: DC
Start: 2017-04-08 — End: 2018-02-21

## 2017-04-08 MED ORDER — OXYCODONE HCL 15 MG PO TABS: 15.0000 mg | ORAL_TABLET | Freq: Four times a day (QID) | ORAL | 0 refills | Status: DC | PRN

## 2017-04-08 NOTE — Progress Notes (Signed)
Patient: Vincent Mason Male    DOB: 1935-10-20   81 y.o.   MRN: [id number] Visit Date: 04/08/2017  Today's Provider: Lelon Huh, MD   Chief Complaint  Patient presents with  . Hypertension  . Anemia  . Congestive Heart Failure  . Chronic Kidney Disease  . Peripheral Neuropathy   Subjective:    HPI  Hypertensive pulmonary vascular disease ([place]) From 12/15/2016-no changes made, had follow up Dr. Nehemiah Massed 01/04/2017  Chronic kidney disease (CKD), stage III (moderate) From 12/15/2016-no changes made, continues regular follow up Dr. Candiss Norse last seen on 03-23-2017   Acute on chronic diastolic CHF (congestive heart failure), NYHA class 1 ([place]) From 12/15/2016-labs checked, no changes.   Neuropathy Patient reports that he is starting to experience a numbness sensation in both of his feet. He also reports that it is starting to radiate up both of his ankles. He states that he has had symptoms for at least 2 months. He had normal B12 levels in July, but vitamin D 25-OH was low at 16.1. He is not currently taking any vitamin D supplementation.   Allergies  Allergen Reactions  . Decongestant  [Oxymetazoline]      Current Outpatient Medications:  .  apixaban (ELIQUIS) 5 MG TABS tablet, Take 1 tablet (5 mg total) by mouth 2 (two) times daily., Disp: 60 tablet, Rfl: 0 .  aspirin EC 81 MG EC tablet, Take 1 tablet (81 mg total) by mouth daily., Disp: 30 tablet, Rfl: 0 .  ferrous sulfate 325 (65 FE) MG tablet, Take 1 tablet (325 mg total) by mouth 2 (two) times daily with a meal., Disp: 60 tablet, Rfl: 3 .  methocarbamol (ROBAXIN) 750 MG tablet, Take 750 mg by mouth at bedtime as needed for muscle spasms., Disp: , Rfl:  .  metolazone (ZAROXOLYN) 5 MG tablet, Take 5 mg by mouth daily as needed (swelling). , Disp: , Rfl:  .  metoprolol succinate (TOPROL-XL) 25 MG 24 hr tablet, Take 0.5 tablets (12.5 mg total) by mouth daily., Disp: 30 tablet, Rfl: 0 .  oxyCODONE (ROXICODONE) 15 MG  immediate release tablet, Take 1 tablet (15 mg total) by mouth every 6 (six) hours as needed for pain., Disp: 120 tablet, Rfl: 0 .  polyethylene glycol (MIRALAX / GLYCOLAX) packet, Take 17 g by mouth daily., Disp: 14 each, Rfl: 0 .  pramipexole (MIRAPEX) 0.25 MG tablet, TAKE ONE TO TWO TABLETS BY MOUTH AT BEDTIME AS NEEDED FOR RESTLESS LEG, Disp: 60 tablet, Rfl: 5 .  ranitidine (ZANTAC) 150 MG tablet, Take 150 mg by mouth 2 (two) times daily., Disp: , Rfl:  .  spironolactone (ALDACTONE) 25 MG tablet, Take 25 mg daily by mouth., Disp: , Rfl:  .  torsemide (DEMADEX) 20 MG tablet, Take 20 mg daily by mouth., Disp: , Rfl:  .  ULORIC 80 MG TABS, TAKE ONE (1) TABLET EACH DAY - STOP TAKING ALLOPURINOL., Disp: 30 tablet, Rfl: 11  Review of Systems  Constitutional: Negative for appetite change, chills and fever.  Respiratory: Negative for chest tightness, shortness of breath and wheezing.   Cardiovascular: Negative for chest pain and palpitations.  Gastrointestinal: Negative for abdominal pain, nausea and vomiting.  Musculoskeletal: Positive for myalgias.  Neurological: Positive for numbness. Negative for tremors and weakness.    Social History   Tobacco Use  . Smoking status: Former Research scientist (life sciences)  . Smokeless tobacco: Never Used  . Tobacco comment: Quit in 1990  Substance Use Topics  . Alcohol  use: No   Objective:   BP 108/72 (BP Location: Right Arm, Patient Position: Sitting, Cuff Size: Normal)   Pulse (!) 52 Comment: irregular  Temp 97.6 F (36.4 C)   Resp 16   Ht 5\' 10"  (1.778 m)   Wt 189 lb (85.7 kg)   SpO2 99%   BMI 27.12 kg/m  Vitals:   04/08/17 1120  BP: 108/72  Pulse: (!) 52  Resp: 16  Temp: 97.6 F (36.4 C)  SpO2: 99%  Weight: 189 lb (85.7 kg)  Height: 5\' 10"  (1.778 m)     Physical Exam   General Appearance:    Alert, cooperative, no distress  Eyes:    PERRL, conjunctiva/corneas clear, EOM's intact       Lungs:     Clear to auscultation bilaterally, respirations  unlabored  Heart:    Regular rate and rhythm, II/VI systolic murmur.   Neurologic:   Awake, alert, oriented x 3. Diminished s/s both feet, cap refill about 1 second. .           Assessment & Plan:     1. Hypertensive pulmonary vascular disease ([place]) Well controlled.  Continue current medications.    2. Neuropathy Idiopathic versus secondary to vitamin d deficiency as below. Consider neurology referral if not improved after d levels normalized.   3. Vitamin D deficiency Start vitamin d 50k units weekly  4. Chronic kidney disease (CKD), stage III (moderate) (HCC) Continue regular follow up Dr. Candiss Norse.   5. Influenza vaccine needed  - Flu vaccine HIGH DOSE PF (Fluzone High dose)  Return in about 3 months (around 07/09/2017).        Lelon Huh, MD  [facility]

## 2017-04-26 ENCOUNTER — Encounter: Payer: Self-pay | Admitting: Family Medicine

## 2017-04-26 ENCOUNTER — Ambulatory Visit (INDEPENDENT_AMBULATORY_CARE_PROVIDER_SITE_OTHER): Payer: Medicare Other | Admitting: Family Medicine

## 2017-04-26 VITALS — BP 106/68 | HR 91 | Temp 97.6°F | Resp 16 | Wt 181.0 lb

## 2017-04-26 DIAGNOSIS — R42 Dizziness and giddiness: Secondary | ICD-10-CM

## 2017-04-26 LAB — CBC WITH DIFFERENTIAL/PLATELET
Basophils Absolute: 63 cells/uL (ref 0–200)
Basophils Relative: 0.7 %
Eosinophils Absolute: 72 cells/uL (ref 15–500)
Eosinophils Relative: 0.8 %
HCT: 42.8 % (ref 38.5–50.0)
Hemoglobin: 14.6 g/dL (ref 13.2–17.1)
Lymphs Abs: 927 cells/uL (ref 850–3900)
MCH: 31.5 pg (ref 27.0–33.0)
MCHC: 34.1 g/dL (ref 32.0–36.0)
MCV: 92.2 fL (ref 80.0–100.0)
MPV: 9.8 fL (ref 7.5–12.5)
Monocytes Relative: 9.9 %
Neutro Abs: 7047 cells/uL (ref 1500–7800)
Neutrophils Relative %: 78.3 %
Platelets: 257 10*3/uL (ref 140–400)
RBC: 4.64 10*6/uL (ref 4.20–5.80)
RDW: 14.4 % (ref 11.0–15.0)
Total Lymphocyte: 10.3 %
WBC mixed population: 891 cells/uL (ref 200–950)
WBC: 9 10*3/uL (ref 3.8–10.8)

## 2017-04-26 LAB — BASIC METABOLIC PANEL WITH GFR
BUN/Creatinine Ratio: 15 (calc) (ref 6–22)
BUN: 36 mg/dL — ABNORMAL HIGH (ref 7–25)
CO2: 31 mmol/L (ref 20–32)
Calcium: 9.9 mg/dL (ref 8.6–10.3)
Chloride: 99 mmol/L (ref 98–110)
Creat: 2.42 mg/dL — ABNORMAL HIGH (ref 0.70–1.18)
GFR, Est African American: 28 mL/min/{1.73_m2} — ABNORMAL LOW (ref >=60)
GFR, Est Non African American: 24 mL/min/{1.73_m2} — ABNORMAL LOW (ref >=60)
Glucose, Bld: 119 mg/dL — ABNORMAL HIGH (ref 65–99)
Potassium: 4.4 mmol/L (ref 3.5–5.3)
Sodium: 141 mmol/L (ref 135–146)

## 2017-04-26 NOTE — Progress Notes (Signed)
Subjective:     Patient ID: Vincent Mason, male   DOB: August 19, 1935, 81 y.o.   MRN: [id number] Chief Complaint  Patient presents with  . Dizziness    Patient reports for the past two days symptom of dizziness when standing. Patient states " when I stand up everything goes blank." Patient reports visual changes and weakness.   Reports when it first happened he had gotten up suddenly out of bed. States he feels he will feel like he is going to pass out for a second or two then resolves. HPI   Review of Systems     Objective:   Physical Exam  Constitutional: He appears well-developed and well-nourished. No distress.  Neck: Carotid bruit is not present.  Cardiovascular: An irregular rhythm present.  No murmur heard. Musculoskeletal: He exhibits no edema (of lower extremity).  BP lying 128/84 PR 73 BP standing 112/86 PR 69     Assessment:    1. Postural dizziness - BASIC METABOLIC PANEL WITH GFR - CBC with Differential/Platelet    Plan:    Further f/u pending lab results. Discussed getting up slowly and allowing time when first getting out of bed to put his feet on the floor.

## 2017-04-26 NOTE — Patient Instructions (Signed)
We will call you with the lab results. When getting up do so slowly--when getting out of bed put your feet on the floor first for 30 seconds before arising.

## 2017-04-27 ENCOUNTER — Telehealth: Payer: Self-pay | Admitting: Family Medicine

## 2017-04-27 NOTE — Telephone Encounter (Signed)
Advised patient as below.  

## 2017-04-27 NOTE — Telephone Encounter (Signed)
His blood count is actually better now than it has been in awhile, so dizziness is not related to missing iron supplements. Marland Kitchen

## 2017-04-27 NOTE — Telephone Encounter (Signed)
Please review. Thanks!  

## 2017-04-27 NOTE — Telephone Encounter (Signed)
Pt states he was seen in the office yesterday for dizziness.  States it just occurred to him that he normally takes Iron pills twice daily and has not been because he ran out.  Pt wants to know if this would have anything to do with the dizziness.

## 2017-05-07 DIAGNOSIS — I10 Essential (primary) hypertension: Secondary | ICD-10-CM | POA: Diagnosis not present

## 2017-05-07 DIAGNOSIS — R601 Generalized edema: Secondary | ICD-10-CM | POA: Diagnosis not present

## 2017-05-07 DIAGNOSIS — N183 Chronic kidney disease, stage 3 (moderate): Secondary | ICD-10-CM | POA: Diagnosis not present

## 2017-05-07 DIAGNOSIS — I272 Pulmonary hypertension, unspecified: Secondary | ICD-10-CM | POA: Diagnosis not present

## 2017-05-12 ENCOUNTER — Other Ambulatory Visit: Payer: Self-pay | Admitting: Family Medicine

## 2017-05-12 DIAGNOSIS — M5136 Other intervertebral disc degeneration, lumbar region: Secondary | ICD-10-CM

## 2017-05-12 MED ORDER — OXYCODONE HCL 15 MG PO TABS: 15.0000 mg | ORAL_TABLET | Freq: Four times a day (QID) | ORAL | 0 refills | Status: DC | PRN

## 2017-05-12 NOTE — Telephone Encounter (Signed)
Pt contacted office for refill request on the following medications:  oxyCODONE (ROXICODONE) 15 MG immediate release tablet   [name].  OI#[phone]/[id number]

## 2017-05-12 NOTE — Telephone Encounter (Signed)
Please review, last fill was 04/08/17-Jaquelyn Sakamoto Estell Harpin, RMA

## 2017-06-08 ENCOUNTER — Encounter: Payer: Self-pay | Admitting: Family Medicine

## 2017-06-15 ENCOUNTER — Other Ambulatory Visit: Payer: Self-pay | Admitting: Family Medicine

## 2017-06-15 DIAGNOSIS — M5136 Other intervertebral disc degeneration, lumbar region: Secondary | ICD-10-CM

## 2017-06-15 MED ORDER — OXYCODONE HCL 15 MG PO TABS: 15.0000 mg | ORAL_TABLET | Freq: Four times a day (QID) | ORAL | 0 refills | Status: DC | PRN

## 2017-06-15 NOTE — Telephone Encounter (Signed)
Pt contacted office for refill request on the following medications:  oxyCODONE (ROXICODONE) 15 MG immediate release tablet   Vincent Mason  Pt was advised refill request can take 24 to 48 hours. Pt stated he is out of the medication.  Last Rx: 05/12/17 LOV: 04/26/17  Please advise. Thanks TNP

## 2017-06-16 NOTE — Telephone Encounter (Signed)
Patient advised.

## 2017-07-08 DIAGNOSIS — N183 Chronic kidney disease, stage 3 (moderate): Secondary | ICD-10-CM | POA: Diagnosis not present

## 2017-07-08 DIAGNOSIS — R600 Localized edema: Secondary | ICD-10-CM | POA: Diagnosis not present

## 2017-07-08 DIAGNOSIS — I25810 Atherosclerosis of coronary artery bypass graft(s) without angina pectoris: Secondary | ICD-10-CM | POA: Diagnosis not present

## 2017-07-08 DIAGNOSIS — I482 Chronic atrial fibrillation: Secondary | ICD-10-CM | POA: Diagnosis not present

## 2017-07-08 DIAGNOSIS — I10 Essential (primary) hypertension: Secondary | ICD-10-CM | POA: Diagnosis not present

## 2017-07-09 ENCOUNTER — Encounter: Payer: Self-pay | Admitting: Family Medicine

## 2017-07-09 ENCOUNTER — Ambulatory Visit (INDEPENDENT_AMBULATORY_CARE_PROVIDER_SITE_OTHER): Payer: Medicare Other | Admitting: Family Medicine

## 2017-07-09 VITALS — BP 132/70 | HR 60 | Temp 98.5°F | Resp 16 | Wt 194.0 lb

## 2017-07-09 DIAGNOSIS — I482 Chronic atrial fibrillation, unspecified: Secondary | ICD-10-CM

## 2017-07-09 DIAGNOSIS — J438 Other emphysema: Secondary | ICD-10-CM

## 2017-07-09 DIAGNOSIS — M5136 Other intervertebral disc degeneration, lumbar region: Secondary | ICD-10-CM

## 2017-07-09 DIAGNOSIS — M5020 Other cervical disc displacement, unspecified cervical region: Secondary | ICD-10-CM | POA: Diagnosis not present

## 2017-07-09 MED ORDER — HYDROCODONE-ACETAMINOPHEN 10-325 MG PO TABS: 1.0000 | ORAL_TABLET | Freq: Four times a day (QID) | ORAL | 0 refills | Status: DC | PRN

## 2017-07-09 NOTE — Progress Notes (Signed)
Patient: Vincent Mason Male    DOB: [birth year]   82 y.o.   MRN: [id number] Visit Date: 07/09/2017  Today's Provider: Lelon Huh, MD   Chief Complaint  Patient presents with  . Hypertension  . Chronic Kidney Disease   Subjective:    HPI  Hypertensive pulmonary vascular disease ([place]) From 04/08/2017-no changes. Continue current medications.  Patient reports good compliance with treatment and good tolerance.  Neuropathy From 04/08/2017-Idiopathic versus secondary to vitamin d deficiency as below. Consider neurology referral if not improved after d levels normalized. Patient reports that this problem is unchanged since last office visit.   Vitamin D deficiency From 04/08/2017-Started vitamin d 50k units weekly. Patient reports good compliance with treatment, and good tolerance.  Chronic kidney disease (CKD), stage III (moderate) (HCC) From 04/08/2017-Continue regular follow up Dr. Candiss Norse. Patient reports good compliance with treatment.   Chronic lumbar disk disease.  He continues on oxycodone 15mg  usually four times a day, but states his insurance no longer covers medication. He is not sure if there is a prior auth issue or if it is just not covered at all.    Allergies  Allergen Reactions  . Decongestant  [Oxymetazoline]      Current Outpatient Medications:  .  apixaban (ELIQUIS) 5 MG TABS tablet, Take 1 tablet (5 mg total) by mouth 2 (two) times daily., Disp: 60 tablet, Rfl: 0 .  ferrous sulfate 325 (65 FE) MG tablet, Take 1 tablet (325 mg total) by mouth 2 (two) times daily with a meal., Disp: 60 tablet, Rfl: 3 .  methocarbamol (ROBAXIN) 750 MG tablet, Take 750 mg by mouth at bedtime as needed for muscle spasms., Disp: , Rfl:  .  metolazone (ZAROXOLYN) 5 MG tablet, Take 5 mg by mouth daily as needed (swelling). , Disp: , Rfl:  .  metoprolol succinate (TOPROL-XL) 25 MG 24 hr tablet, Take 0.5 tablets (12.5 mg total) by mouth daily., Disp: 30 tablet, Rfl: 0 .   oxyCODONE (ROXICODONE) 15 MG immediate release tablet, Take 1 tablet (15 mg total) by mouth every 6 (six) hours as needed for pain., Disp: 120 tablet, Rfl: 0 .  polyethylene glycol (MIRALAX / GLYCOLAX) packet, Take 17 g by mouth daily., Disp: 14 each, Rfl: 0 .  pramipexole (MIRAPEX) 0.25 MG tablet, TAKE ONE TO TWO TABLETS BY MOUTH AT BEDTIME AS NEEDED FOR RESTLESS LEG, Disp: 60 tablet, Rfl: 5 .  ranitidine (ZANTAC) 150 MG tablet, Take 150 mg by mouth 2 (two) times daily., Disp: , Rfl:  .  spironolactone (ALDACTONE) 25 MG tablet, Take 25 mg daily by mouth., Disp: , Rfl:  .  torsemide (DEMADEX) 20 MG tablet, Take 20 mg daily by mouth., Disp: , Rfl:  .  ULORIC 80 MG TABS, TAKE ONE (1) TABLET EACH DAY - STOP TAKING ALLOPURINOL., Disp: 30 tablet, Rfl: 11 .  Vitamin D, Ergocalciferol, (DRISDOL) 50000 units CAPS capsule, Take 1 capsule (50,000 Units total) every 7 (seven) days by mouth., Disp: 12 capsule, Rfl: 1 .  aspirin EC 81 MG EC tablet, Take 1 tablet (81 mg total) by mouth daily. (Patient not taking: Reported on 07/09/2017), Disp: 30 tablet, Rfl: 0  Review of Systems  Constitutional: Negative for appetite change, chills and fever.  Respiratory: Negative for chest tightness, shortness of breath and wheezing.   Cardiovascular: Negative for chest pain and palpitations.  Gastrointestinal: Negative for abdominal pain, nausea and vomiting.    Social History   Tobacco Use  .  Smoking status: Former Research scientist (life sciences)  . Smokeless tobacco: Never Used  . Tobacco comment: Quit in 1990  Substance Use Topics  . Alcohol use: No   Objective:   BP 132/70 (BP Location: Left Arm, Patient Position: Sitting, Cuff Size: Normal)   Pulse 60   Temp 98.5 F (36.9 C) (Oral)   Resp 16   Wt 194 lb (88 kg)   SpO2 97% Comment: room air  BMI 27.84 kg/m  There were no vitals filed for this visit.   Physical Exam   General Appearance:    Alert, cooperative, no distress  Eyes:    PERRL, conjunctiva/corneas clear, EOM's  intact       Lungs:     Clear to auscultation bilaterally, respirations unlabored  Heart:     Irregularly irregular rhythm. Normal rate , II/VI systolic murmur.   Neurologic:   Awake, alert, oriented x 3. No apparent focal neurological           defect.   Ext:   2+ bipedal edema.        Assessment & Plan:     1. Displacement of cervical intervertebral disc without myelopathy Adequate pain control on oxycodone, but apparently no longer covered by insurance. Will change to hydrocodone/apap  10/325 QID to see If this works any better.   2. Degenerative disc disease, lumbar   3. Other emphysema ([place]) Doing well, continue current inhalers.   4. Chronic atrial fibrillation (HCC) Rate controlled on appropriate anticoagulation.   Follow up  6 months.        Lelon Huh, MD  [facility]

## 2017-07-13 ENCOUNTER — Telehealth: Payer: Self-pay | Admitting: Family Medicine

## 2017-07-13 DIAGNOSIS — M5136 Other intervertebral disc degeneration, lumbar region: Secondary | ICD-10-CM

## 2017-07-13 NOTE — Telephone Encounter (Signed)
Patient tried the Hydrocodone but it gave him an upset stomach and cannot take it.   He wants you to refill the Oxycodone 15 mg.  to [facility]

## 2017-07-13 NOTE — Telephone Encounter (Signed)
Please advise 

## 2017-07-14 MED ORDER — OXYCODONE HCL 15 MG PO TABS: 15.0000 mg | ORAL_TABLET | Freq: Four times a day (QID) | ORAL | 0 refills | Status: DC | PRN

## 2017-07-14 NOTE — Telephone Encounter (Signed)
Pt called to see if Rx for oxyCODONE (ROXICODONE) 15 MG immediate release tablet was sent to [facility]. Please advise. Thanks TNP

## 2017-08-06 ENCOUNTER — Emergency Department: Payer: Medicare Other

## 2017-08-06 ENCOUNTER — Other Ambulatory Visit: Payer: Self-pay

## 2017-08-06 ENCOUNTER — Inpatient Hospital Stay: Payer: Medicare Other

## 2017-08-06 ENCOUNTER — Inpatient Hospital Stay (HOSPITAL_COMMUNITY): Payer: Medicare Other

## 2017-08-06 ENCOUNTER — Inpatient Hospital Stay
Admission: EM | Admit: 2017-08-06 | Discharge: 2017-08-07 | DRG: 101 | Disposition: A | Discharge status: Home / Self Care | Payer: Medicare Other | Attending: Internal Medicine | Admitting: Internal Medicine

## 2017-08-06 DIAGNOSIS — Z87891 Personal history of nicotine dependence: Secondary | ICD-10-CM

## 2017-08-06 DIAGNOSIS — E860 Dehydration: Secondary | ICD-10-CM | POA: Diagnosis present

## 2017-08-06 DIAGNOSIS — I445 Left posterior fascicular block: Secondary | ICD-10-CM | POA: Diagnosis present

## 2017-08-06 DIAGNOSIS — R32 Unspecified urinary incontinence: Secondary | ICD-10-CM | POA: Diagnosis present

## 2017-08-06 DIAGNOSIS — Z951 Presence of aortocoronary bypass graft: Secondary | ICD-10-CM | POA: Diagnosis not present

## 2017-08-06 DIAGNOSIS — Z8249 Family history of ischemic heart disease and other diseases of the circulatory system: Secondary | ICD-10-CM

## 2017-08-06 DIAGNOSIS — Z7901 Long term (current) use of anticoagulants: Secondary | ICD-10-CM | POA: Diagnosis not present

## 2017-08-06 DIAGNOSIS — G4733 Obstructive sleep apnea (adult) (pediatric): Secondary | ICD-10-CM | POA: Diagnosis present

## 2017-08-06 DIAGNOSIS — M25559 Pain in unspecified hip: Secondary | ICD-10-CM

## 2017-08-06 DIAGNOSIS — N183 Chronic kidney disease, stage 3 (moderate): Secondary | ICD-10-CM | POA: Diagnosis present

## 2017-08-06 DIAGNOSIS — I4891 Unspecified atrial fibrillation: Secondary | ICD-10-CM

## 2017-08-06 DIAGNOSIS — E559 Vitamin D deficiency, unspecified: Secondary | ICD-10-CM | POA: Diagnosis present

## 2017-08-06 DIAGNOSIS — Z79891 Long term (current) use of opiate analgesic: Secondary | ICD-10-CM

## 2017-08-06 DIAGNOSIS — I482 Chronic atrial fibrillation: Secondary | ICD-10-CM | POA: Diagnosis present

## 2017-08-06 DIAGNOSIS — Z888 Allergy status to other drugs, medicaments and biological substances status: Secondary | ICD-10-CM | POA: Diagnosis not present

## 2017-08-06 DIAGNOSIS — G40909 Epilepsy, unspecified, not intractable, without status epilepticus: Secondary | ICD-10-CM | POA: Diagnosis not present

## 2017-08-06 DIAGNOSIS — Z9119 Patient's noncompliance with other medical treatment and regimen: Secondary | ICD-10-CM

## 2017-08-06 DIAGNOSIS — Z7982 Long term (current) use of aspirin: Secondary | ICD-10-CM

## 2017-08-06 DIAGNOSIS — N179 Acute kidney failure, unspecified: Secondary | ICD-10-CM | POA: Diagnosis present

## 2017-08-06 DIAGNOSIS — I878 Other specified disorders of veins: Secondary | ICD-10-CM | POA: Diagnosis present

## 2017-08-06 DIAGNOSIS — Z9849 Cataract extraction status, unspecified eye: Secondary | ICD-10-CM

## 2017-08-06 DIAGNOSIS — I129 Hypertensive chronic kidney disease with stage 1 through stage 4 chronic kidney disease, or unspecified chronic kidney disease: Secondary | ICD-10-CM | POA: Diagnosis present

## 2017-08-06 DIAGNOSIS — Z79899 Other long term (current) drug therapy: Secondary | ICD-10-CM | POA: Diagnosis not present

## 2017-08-06 DIAGNOSIS — M79652 Pain in left thigh: Secondary | ICD-10-CM | POA: Diagnosis not present

## 2017-08-06 DIAGNOSIS — J449 Chronic obstructive pulmonary disease, unspecified: Secondary | ICD-10-CM | POA: Diagnosis present

## 2017-08-06 DIAGNOSIS — R569 Unspecified convulsions: Secondary | ICD-10-CM

## 2017-08-06 DIAGNOSIS — Z86010 Personal history of colonic polyps: Secondary | ICD-10-CM | POA: Diagnosis not present

## 2017-08-06 DIAGNOSIS — E785 Hyperlipidemia, unspecified: Secondary | ICD-10-CM | POA: Diagnosis present

## 2017-08-06 DIAGNOSIS — E872 Acidosis: Secondary | ICD-10-CM | POA: Diagnosis present

## 2017-08-06 DIAGNOSIS — M25552 Pain in left hip: Secondary | ICD-10-CM | POA: Diagnosis present

## 2017-08-06 DIAGNOSIS — I2510 Atherosclerotic heart disease of native coronary artery without angina pectoris: Secondary | ICD-10-CM | POA: Diagnosis present

## 2017-08-06 LAB — LACTIC ACID, PLASMA
Lactic Acid, Venous: 2.6 mmol/L (ref 0.5–1.9)
Lactic Acid, Venous: 1.1 mmol/L (ref 0.5–1.9)
Lactic Acid, Venous: 1.1 mmol/L (ref 0.5–1.9)
Lactic Acid, Venous: 1 mmol/L (ref 0.5–1.9)

## 2017-08-06 LAB — COMPREHENSIVE METABOLIC PANEL
ALT: 17 U/L (ref 17–63)
AST: 27 U/L (ref 15–41)
Albumin: 4.5 g/dL (ref 3.5–5.0)
Alkaline Phosphatase: 55 U/L (ref 38–126)
Anion gap: 10 (ref 5–15)
BUN: 33 mg/dL — ABNORMAL HIGH (ref 6–20)
CO2: 27 mmol/L (ref 22–32)
Calcium: 9.3 mg/dL (ref 8.9–10.3)
Chloride: 103 mmol/L (ref 101–111)
Creatinine, Ser: 2.08 mg/dL — ABNORMAL HIGH (ref 0.61–1.24)
GFR calc Af Amer: 33 mL/min — ABNORMAL LOW (ref >60)
GFR calc non Af Amer: 28 mL/min — ABNORMAL LOW (ref >60)
Glucose, Bld: 141 mg/dL — ABNORMAL HIGH (ref 65–99)
Potassium: 3.7 mmol/L (ref 3.5–5.1)
Sodium: 140 mmol/L (ref 135–145)
Total Bilirubin: 0.5 mg/dL (ref 0.3–1.2)
Total Protein: 7.6 g/dL (ref 6.5–8.1)

## 2017-08-06 LAB — URINALYSIS, COMPLETE (UACMP) WITH MICROSCOPIC
Bacteria, UA: NONE SEEN
Bilirubin Urine: NEGATIVE
Glucose, UA: NEGATIVE mg/dL
Hgb urine dipstick: NEGATIVE
Ketones, ur: NEGATIVE mg/dL
Leukocytes, UA: NEGATIVE
Nitrite: NEGATIVE
Protein, ur: NEGATIVE mg/dL
Specific Gravity, Urine: 1.01 (ref 1.005–1.030)
Squamous Epithelial / LPF: NONE SEEN
pH: 7 (ref 5.0–8.0)

## 2017-08-06 LAB — CBC WITH DIFFERENTIAL/PLATELET
Basophils Absolute: 0 10*3/uL (ref 0–0.1)
Basophils Relative: 0 %
Eosinophils Absolute: 0.1 10*3/uL (ref 0–0.7)
Eosinophils Relative: 2 %
HCT: 41.2 % (ref 40.0–52.0)
Hemoglobin: 13.9 g/dL (ref 13.0–18.0)
Lymphocytes Relative: 17 %
Lymphs Abs: 1.1 10*3/uL (ref 1.0–3.6)
MCH: 32.5 pg (ref 26.0–34.0)
MCHC: 33.7 g/dL (ref 32.0–36.0)
MCV: 96.7 fL (ref 80.0–100.0)
Monocytes Absolute: 0.8 10*3/uL (ref 0.2–1.0)
Monocytes Relative: 12 %
Neutro Abs: 4.5 10*3/uL (ref 1.4–6.5)
Neutrophils Relative %: 69 %
Platelets: 238 10*3/uL (ref 150–440)
RBC: 4.26 MIL/uL — ABNORMAL LOW (ref 4.40–5.90)
RDW: 14.2 % (ref 11.5–14.5)
WBC: 6.6 10*3/uL (ref 3.8–10.6)

## 2017-08-06 LAB — URINE DRUG SCREEN, QUALITATIVE (ARMC ONLY)
Amphetamines, Ur Screen: NOT DETECTED
Barbiturates, Ur Screen: NOT DETECTED
Benzodiazepine, Ur Scrn: NOT DETECTED
Cannabinoid 50 Ng, Ur ~~LOC~~: NOT DETECTED
Cocaine Metabolite,Ur ~~LOC~~: NOT DETECTED
MDMA (Ecstasy)Ur Screen: NOT DETECTED
Methadone Scn, Ur: NOT DETECTED
Opiate, Ur Screen: POSITIVE — AB
Phencyclidine (PCP) Ur S: NOT DETECTED
Tricyclic, Ur Screen: NOT DETECTED

## 2017-08-06 LAB — PROTIME-INR
INR: 1.05
Prothrombin Time: 13.6 seconds (ref 11.4–15.2)

## 2017-08-06 LAB — ETHANOL: Alcohol, Ethyl (B): <10 mg/dL (ref <10)

## 2017-08-06 LAB — TROPONIN I: Troponin I: <0.03 ng/mL (ref <0.03)

## 2017-08-06 LAB — GLUCOSE, CAPILLARY: Glucose-Capillary: 119 mg/dL — ABNORMAL HIGH (ref 65–99)

## 2017-08-06 MED ORDER — METHOCARBAMOL 750 MG PO TABS
750.0000 mg | ORAL_TABLET | Freq: Every evening | ORAL | Status: DC | PRN
  Filled 2017-08-06: qty 1

## 2017-08-06 MED ORDER — LEVETIRACETAM IN NACL 500 MG/100ML IV SOLN
500.0000 mg | Freq: Once | INTRAVENOUS | Status: DC
  Filled 2017-08-06: qty 100

## 2017-08-06 MED ORDER — ACETAMINOPHEN 650 MG RE SUPP: 650.0000 mg | RECTAL | Status: DC | PRN

## 2017-08-06 MED ORDER — BISACODYL 5 MG PO TBEC: 5.0000 mg | DELAYED_RELEASE_TABLET | Freq: Every day | ORAL | Status: DC | PRN

## 2017-08-06 MED ORDER — ADULT MULTIVITAMIN W/MINERALS CH
1.0000 | ORAL_TABLET | Freq: Every day | ORAL | Status: DC
  Administered 2017-08-07: 1 via ORAL
  Filled 2017-08-06 (×2): qty 1

## 2017-08-06 MED ORDER — LEVETIRACETAM IN NACL 500 MG/100ML IV SOLN
500.0000 mg | Freq: Two times a day (BID) | INTRAVENOUS | Status: DC
  Filled 2017-08-06: qty 100

## 2017-08-06 MED ORDER — TORSEMIDE 20 MG PO TABS
20.0000 mg | ORAL_TABLET | Freq: Every day | ORAL | Status: DC
  Administered 2017-08-07: 10:00:00 20 mg via ORAL
  Filled 2017-08-06 (×2): qty 1

## 2017-08-06 MED ORDER — ONDANSETRON HCL 4 MG/2ML IJ SOLN: 4.0000 mg | Freq: Four times a day (QID) | INTRAMUSCULAR | Status: DC | PRN

## 2017-08-06 MED ORDER — LORAZEPAM 2 MG/ML IJ SOLN
0.5000 mg | Freq: Once | INTRAMUSCULAR | Status: AC
  Administered 2017-08-06: 0.5 mg via INTRAVENOUS
  Filled 2017-08-06: qty 1

## 2017-08-06 MED ORDER — LEVETIRACETAM 500 MG PO TABS
500.0000 mg | ORAL_TABLET | Freq: Two times a day (BID) | ORAL | Status: DC
  Administered 2017-08-06 – 2017-08-07 (×2): 500 mg via ORAL
  Filled 2017-08-06 (×2): qty 1

## 2017-08-06 MED ORDER — SODIUM CHLORIDE 0.9 % IV BOLUS (SEPSIS)
1000.0000 mL | Freq: Once | INTRAVENOUS | Status: AC
  Administered 2017-08-06: 1000 mL via INTRAVENOUS

## 2017-08-06 MED ORDER — LEVETIRACETAM 500 MG PO TABS: 500.0000 mg | ORAL_TABLET | Freq: Two times a day (BID) | ORAL | Status: DC

## 2017-08-06 MED ORDER — OXYCODONE HCL 5 MG PO TABS
15.0000 mg | ORAL_TABLET | Freq: Four times a day (QID) | ORAL | Status: DC | PRN
  Administered 2017-08-06: 15 mg via ORAL
  Filled 2017-08-06: qty 3

## 2017-08-06 MED ORDER — FERROUS SULFATE 325 (65 FE) MG PO TABS
325.0000 mg | ORAL_TABLET | Freq: Two times a day (BID) | ORAL | Status: DC
  Administered 2017-08-06 – 2017-08-07 (×2): 325 mg via ORAL
  Filled 2017-08-06 (×2): qty 1

## 2017-08-06 MED ORDER — SODIUM CHLORIDE 0.9 % IV SOLN
75.0000 mL/h | INTRAVENOUS | Status: DC
  Administered 2017-08-06: 07:00:00 75 mL/h via INTRAVENOUS

## 2017-08-06 MED ORDER — OXYCODONE HCL 5 MG PO TABS
15.0000 mg | ORAL_TABLET | Freq: Once | ORAL | Status: AC
  Administered 2017-08-06: 20:00:00 15 mg via ORAL
  Filled 2017-08-06: qty 3

## 2017-08-06 MED ORDER — METOLAZONE 5 MG PO TABS
5.0000 mg | ORAL_TABLET | Freq: Every day | ORAL | Status: DC | PRN
  Filled 2017-08-06: qty 1

## 2017-08-06 MED ORDER — METOPROLOL TARTRATE 5 MG/5ML IV SOLN
2.5000 mg | Freq: Once | INTRAVENOUS | Status: AC
  Administered 2017-08-06: 2.5 mg via INTRAVENOUS
  Filled 2017-08-06: qty 5

## 2017-08-06 MED ORDER — SENNOSIDES-DOCUSATE SODIUM 8.6-50 MG PO TABS: 1.0000 | ORAL_TABLET | Freq: Every evening | ORAL | Status: DC | PRN

## 2017-08-06 MED ORDER — ACETAMINOPHEN 325 MG PO TABS: 650.0000 mg | ORAL_TABLET | ORAL | Status: DC | PRN

## 2017-08-06 MED ORDER — DOCUSATE SODIUM 100 MG PO CAPS
100.0000 mg | ORAL_CAPSULE | Freq: Two times a day (BID) | ORAL | Status: DC
  Administered 2017-08-07: 100 mg via ORAL
  Filled 2017-08-06 (×2): qty 1

## 2017-08-06 MED ORDER — SODIUM CHLORIDE 0.9 % IV SOLN
500.0000 mg | Freq: Two times a day (BID) | INTRAVENOUS | Status: DC
  Filled 2017-08-06: qty 5

## 2017-08-06 MED ORDER — METOPROLOL SUCCINATE ER 25 MG PO TB24
12.5000 mg | ORAL_TABLET | Freq: Every day | ORAL | Status: DC
  Administered 2017-08-07: 10:00:00 12.5 mg via ORAL
  Filled 2017-08-06 (×2): qty 1

## 2017-08-06 MED ORDER — SODIUM CHLORIDE 0.9 % IV SOLN
500.0000 mg | Freq: Once | INTRAVENOUS | Status: AC
  Administered 2017-08-06: 500 mg via INTRAVENOUS
  Filled 2017-08-06: qty 5

## 2017-08-06 MED ORDER — ASPIRIN EC 81 MG PO TBEC
81.0000 mg | DELAYED_RELEASE_TABLET | Freq: Every day | ORAL | Status: DC
  Administered 2017-08-07: 81 mg via ORAL
  Filled 2017-08-06 (×2): qty 1

## 2017-08-06 MED ORDER — VITAMIN D (ERGOCALCIFEROL) 1.25 MG (50000 UNIT) PO CAPS
50000.0000 [IU] | ORAL_CAPSULE | ORAL | Status: DC
  Filled 2017-08-06: qty 1

## 2017-08-06 MED ORDER — SPIRONOLACTONE 25 MG PO TABS
25.0000 mg | ORAL_TABLET | Freq: Every day | ORAL | Status: DC
  Administered 2017-08-07: 10:00:00 25 mg via ORAL
  Filled 2017-08-06 (×2): qty 1

## 2017-08-06 MED ORDER — ONDANSETRON HCL 4 MG PO TABS: 4.0000 mg | ORAL_TABLET | Freq: Four times a day (QID) | ORAL | Status: DC | PRN

## 2017-08-06 MED ORDER — OXYCODONE HCL 5 MG PO TABS: 30.0000 mg | ORAL_TABLET | Freq: Four times a day (QID) | ORAL | Status: DC | PRN

## 2017-08-06 MED ORDER — SENNA 8.6 MG PO TABS
1.0000 | ORAL_TABLET | Freq: Two times a day (BID) | ORAL | Status: DC
  Administered 2017-08-07: 8.6 mg via ORAL
  Filled 2017-08-06 (×2): qty 1

## 2017-08-06 MED ORDER — POLYETHYLENE GLYCOL 3350 17 G PO PACK
17.0000 g | PACK | Freq: Every day | ORAL | Status: DC
  Filled 2017-08-06 (×2): qty 1

## 2017-08-06 MED ORDER — APIXABAN 2.5 MG PO TABS
2.5000 mg | ORAL_TABLET | Freq: Two times a day (BID) | ORAL | Status: DC
  Administered 2017-08-06 – 2017-08-07 (×2): 2.5 mg via ORAL
  Filled 2017-08-06 (×2): qty 1

## 2017-08-06 MED ORDER — FOLIC ACID 1 MG PO TABS
1.0000 mg | ORAL_TABLET | Freq: Every day | ORAL | Status: DC
  Administered 2017-08-07: 10:00:00 1 mg via ORAL
  Filled 2017-08-06 (×2): qty 1

## 2017-08-06 MED ORDER — VITAMIN B-1 100 MG PO TABS
100.0000 mg | ORAL_TABLET | Freq: Every day | ORAL | Status: DC
  Administered 2017-08-07: 100 mg via ORAL
  Filled 2017-08-06 (×2): qty 1

## 2017-08-06 NOTE — ED Notes (Signed)
Patient again responding to verbal stimuli/instructions

## 2017-08-06 NOTE — ED Notes (Signed)
Pt returned to ED Rm 24 from CT at this time.

## 2017-08-06 NOTE — Progress Notes (Signed)
[place] at [place] NAME: Vincent Mason    MR#:  [id number]  DATE OF BIRTH:  07-Dec-1935  SUBJECTIVE:  CHIEF COMPLAINT: Patient is lethargic, had a seizure at 3 AM today.  Wife at bedside  REVIEW OF SYSTEMS:  Review of system unobtainable as the patient is postictal  DRUG ALLERGIES:   Allergies  Allergen Reactions  . Decongestant  [Oxymetazoline]     VITALS:  Blood pressure 123/83, pulse (!) 103, temperature 98.4 F (36.9 C), temperature source Oral, resp. rate 18, height 6' (1.829 m), weight 85.7 kg (189 lb), SpO2 100 %.  PHYSICAL EXAMINATION:  GENERAL:  82 y.o.-year-old patient lying in the bed with no acute distress.  EYES: Pupils equal, round, reactive to light and accommodation. No scleral icterus. Extraocular muscles intact.  HEENT: Head atraumatic, normocephalic. Oropharynx and nasopharynx clear.  NECK:  Supple, no jugular venous distention. No thyroid enlargement, no tenderness.  LUNGS: Normal breath sounds bilaterally, no wheezing, rales,rhonchi or crepitation. No use of accessory muscles of respiration.  CARDIOVASCULAR: S1, S2 normal. No murmurs, rubs, or gallops.  ABDOMEN: Soft, nontender, nondistended. Bowel sounds present.  EXTREMITIES: No pedal edema, cyanosis, or clubbing.  NEUROLOGIC:  patient is delirious PSYCHIATRIC: The patient is disoriented .  SKIN: No obvious rash, lesion, or ulcer.    LABORATORY PANEL:   CBC Recent Labs  Lab 08/06/17 0402  WBC 6.6  HGB 13.9  HCT 41.2  PLT 238   ------------------------------------------------------------------------------------------------------------------  Chemistries  Recent Labs  Lab 08/06/17 0402  NA 140  K 3.7  CL 103  CO2 27  GLUCOSE 141*  BUN 33*  CREATININE 2.08*  CALCIUM 9.3  AST 27  ALT 17  ALKPHOS 55  BILITOT 0.5    ------------------------------------------------------------------------------------------------------------------  Cardiac Enzymes Recent Labs  Lab 08/06/17 0402  TROPONINI <0.03   ------------------------------------------------------------------------------------------------------------------  RADIOLOGY:  Ct Head Wo Contrast  Result Date: 08/06/2017 CLINICAL DATA:  Acute onset of seizure like activity.  Somnolence. EXAM: CT HEAD WITHOUT CONTRAST TECHNIQUE: Contiguous axial images were obtained from the base of the skull through the vertex without intravenous contrast. COMPARISON:  CT of the head performed 01/21/2017, and MRI of the brain performed 01/22/2017 FINDINGS: Brain: No evidence of acute infarction, hemorrhage, hydrocephalus, extra-axial collection or mass lesion / mass effect. Prominence of the ventricles and sulci reflects moderate cortical volume loss. Cerebellar atrophy is noted. Mild periventricular white matter change likely reflects small vessel ischemic microangiopathy. A chronic lacunar infarct is noted at the left basal ganglia. The brainstem and fourth ventricle are within normal limits. The cerebral hemispheres demonstrate grossly normal gray-white differentiation. No mass effect or midline shift is seen. Vascular: No hyperdense vessel or unexpected calcification. Skull: There is no evidence of fracture; visualized osseous structures are unremarkable in appearance. Sinuses/Orbits: The visualized portions of the orbits are within normal limits. The paranasal sinuses and mastoid air cells are well-aerated. Other: No significant soft tissue abnormalities are seen. IMPRESSION: 1. No acute intracranial pathology seen on CT. 2. Moderate cortical volume loss and scattered small vessel ischemic microangiopathy. 3. Chronic lacunar infarct at the left basal ganglia. Electronically Signed   By: Garald Balding M.D.   On: 08/06/2017 04:53   Dg Chest Port 1 View  Result Date:  08/06/2017 CLINICAL DATA:  Seizure EXAM: PORTABLE CHEST 1 VIEW COMPARISON:  Chest radiograph 01/21/2017 FINDINGS: Unchanged cardiomegaly. Remote median sternotomy for CABG. Chronic interstitial prominence without focal consolidation. No pulmonary edema, pleural effusion or pneumothorax. IMPRESSION:  Unchanged cardiomegaly without acute airspace disease. Electronically Signed   By: Ulyses Jarred M.D.   On: 08/06/2017 04:18    EKG:   Orders placed or performed during the hospital encounter of 08/06/17  . ED EKG  . ED EKG  . EKG 12-Lead  . EKG 12-Lead  . EKG    ASSESSMENT AND PLAN:   -Seizures As patient was seizure free for more than 1 year neurologist has  discontinued his antiepileptics , patient is started on Keppra by neurologist MRI of the brain, EEG Urine drug screen is positive for opiates  -Left hip pain Pain management as needed,  left hip x-ray, PT consult  #Acute on chronic kidney disease Renal function is improving continue close monitoring and avoid nephrotoxins and provide IV fluids  #Atrial fibrillation rate controlled continue metoprolol and Eliquis  #Lactic acidosis secondary to seizure activities- Back to normal range   All the records are reviewed and case discussed with Care Management/Social Workerr. Management plans discussed with the patient, family and they are in agreement.  CODE STATUS: fc   TOTAL TIME TAKING CARE OF THIS PATIENT: 34  minutes.   POSSIBLE D/C IN 1-2  DAYS, DEPENDING ON CLINICAL CONDITION.  Note: This dictation was prepared with Dragon dictation along with smaller phrase technology. Any transcriptional errors that result from this process are unintentional.   Nicholes Mango M.D on 08/06/2017 at 1:12 PM  Between 7am to 6pm - Pager - [phone] After 6pm go to [email or web address] - password [email or web address] [facility]  Office  [phone]  CC: Primary care physician; Birdie Sons, MD

## 2017-08-06 NOTE — ED Notes (Signed)
Patient's oxygen saturation decreased to 84% on RA. Patient placed on 2L [place]. Patient's oxygen saturation increased to 88%. Patietn's oxygen increased to 3L [place]. Patient's oxygen saturation increased to 92% on 3L. Patient's oxygen increased to 4L [place]. Patient's oxygen saturation currently at 95% on 4L [place]. RN will continue to monitor.

## 2017-08-06 NOTE — ED Notes (Signed)
Patient stopped responding to verbal stimuli. Patient still awake, with normal eye movement, withdraws from painful stimuli.

## 2017-08-06 NOTE — Progress Notes (Signed)
   08/06/17 1105  Clinical Encounter Type  Visited With Patient  Visit Type Initial  Referral From Nurse  Consult/Referral To Chaplain  Spiritual Encounters  Spiritual Needs Brochure;Prayer   PT requested information on a AD. [place] left information with PT and will follow up when PT is ready to complete AD. CH offered silent prayer.

## 2017-08-06 NOTE — Progress Notes (Signed)
Family Meeting Note  Advance Directive:yes  Today a meeting took place with the patient';s  spouse at bedside  Patient is unable to participate due AJ:HHIDUP capacity Postictal   The following clinical team members were present during this meeting:MD  The following were discussed:Patient's diagnosis: Recurrent seizures, acute on chronic kidney disease stage III, chronic atrial fibrillation .the plan of care discussed in detail with the patient's wife who is the healthcare power of attorney  patient's progosis: > 12 months and Goals for treatment: Full Code, wife is the patient's healthcare power of attorney  Additional follow-up to be provided: Hospitalist and neurologist  Time spent during discussion:17 min  Nicholes Mango, MD

## 2017-08-06 NOTE — Progress Notes (Signed)
Patient admitted for seizures and has h/o chronic afib takes apixaban 5 mg bid PTA. Patient is 82 years of age w/ a Scr > 1.5 mg/dL, based on dose reduction criteria for afib dose should be reduced to 2.5 mg bid. Spoke to MD to reduce dose of apixaban to 2.5 mg bid for afib. MD notified and agrees with plan.  Tobie Lords, PharmD, BCPS Clinical Pharmacist 08/06/2017

## 2017-08-06 NOTE — Plan of Care (Signed)
Pt is insisting that he be d/ced.  No results from tests to r/o seizure are back.  Paged Dr. Margaretmary Eddy and Dr. Doy Mince.  Dr. Margaretmary Eddy is not comfortable d/cing patient until Neuro releases.  Explained to patient, the neurologist and hospitalist strongly recommend that he stay overnight.  He doesn't care.  Also explained that if he left against medical advice, insurance wouldn't cover his stay or the tests he's had.  Pt said he would think about it.  We have him sitting in the chair.  We've also called his wife several times.

## 2017-08-06 NOTE — H&P (Signed)
[facility] [place] [place] NAME: Vincent Mason    MR#:  [id number]  DATE OF BIRTH:  1935-12-17  DATE OF ADMISSION:  08/06/2017  PRIMARY CARE PHYSICIAN: Birdie Sons, MD   REQUESTING/REFERRING PHYSICIAN: Paulette Blanch, MD  CHIEF COMPLAINT:   Chief Complaint  Patient presents with  . Seizures    HISTORY OF PRESENT ILLNESS:  Vincent Mason  is a 82 y.o. male with a known history of A.fib, COPD, CKD 3 is being admitted for seizure. He was brought from home via EMS with a chief complaint of seizure-like activity.  Reportedly wife awoke with patient having seizure-like activity next to her.  Upon EMS arrival, patient was somnolent and hard to arouse.  Subsequently patient became combative for EMS. While in the ED his room air saturations 84% and had Evidence of urinary incontinence per EDP.  Most the informations is obtained from [place], reviewing records and talking with wife at bedside as patient is somnolent s/p ativan. Per records, patient was seen by Dr. Melrose Nakayama from neurology as recent as 2016 and placed on Trileptal for seizures. Wife says he stopped taking this as he's not had seizure for a while. PAST MEDICAL HISTORY:   Past Medical History:  Diagnosis Date  . Acute encephalopathy 09/03/2015  . Atrial fibrillation and flutter [facility]) 2013   Dr Nehemiah Massed at [facility]  on [address].   . C. difficile colitis 09/12/2015  . COPD (chronic obstructive pulmonary disease) ([place])   . HCAP (healthcare-associated pneumonia) 09/12/2015  . Hyperlipidemia   . Hypertension   . Kidney disease, chronic, stage III (moderate, EGFR 30-59 ml/min) ([place])    ARF 2013  . OSA (obstructive sleep apnea)    non-compliant with CPAP.   Marland Kitchen Severe sepsis with septic shock ([place]) 09/12/2015    PAST SURGICAL HISTORY:   Past Surgical History:  Procedure Laterality Date  . CATARACT EXTRACTION    . CORONARY ARTERY BYPASS GRAFT  10/1997  . history of cervical discectomy  2009   C5-C6  .  Hyperplastic colon polyp  02/2002   Sigmoid polyps    SOCIAL HISTORY:   Social History   Tobacco Use  . Smoking status: Former Research scientist (life sciences)  . Smokeless tobacco: Never Used  . Tobacco comment: Quit in 1990  Substance Use Topics  . Alcohol use: No    FAMILY HISTORY:   Family History  Problem Relation Age of Onset  . Hypertension Mother   . Hypertension Father     DRUG ALLERGIES:   Allergies  Allergen Reactions  . Decongestant  [Oxymetazoline]     REVIEW OF SYSTEMS:   Review of Systems  Unable to perform ROS: Acuity of condition   MEDICATIONS AT HOME:   Prior to Admission medications   Medication Sig Start Date End Date Taking? Authorizing Provider  apixaban (ELIQUIS) 5 MG TABS tablet Take 1 tablet (5 mg total) by mouth 2 (two) times daily. 01/22/17   [name], MD  aspirin EC 81 MG EC tablet Take 1 tablet (81 mg total) by mouth daily. Patient not taking: Reported on 07/09/2017 01/23/17   [name], MD  ferrous sulfate 325 (65 FE) MG tablet Take 1 tablet (325 mg total) by mouth 2 (two) times daily with a meal. 12/07/16   Gladstone Lighter, MD  methocarbamol (ROBAXIN) 750 MG tablet Take 750 mg by mouth at bedtime as needed for muscle spasms.    [provider]  metolazone (ZAROXOLYN) 5 MG tablet  Take 5 mg by mouth daily as needed (swelling).  12/17/16   [provider]  metoprolol succinate (TOPROL-XL) 25 MG 24 hr tablet Take 0.5 tablets (12.5 mg total) by mouth daily. 01/23/17   [name], MD  oxyCODONE (ROXICODONE) 15 MG immediate release tablet Take 1 tablet (15 mg total) by mouth every 6 (six) hours as needed for pain. 07/14/17   Birdie Sons, MD  polyethylene glycol [facility]aveling) packet Take 17 g by mouth daily. 12/08/16   Gladstone Lighter, MD  pramipexole (MIRAPEX) 0.25 MG tablet TAKE ONE TO TWO TABLETS BY MOUTH AT BEDTIME AS NEEDED FOR RESTLESS LEG 07/21/16   Birdie Sons, MD  ranitidine (ZANTAC) 150 MG  tablet Take 150 mg by mouth 2 (two) times daily. 12/18/16   [provider]  spironolactone (ALDACTONE) 25 MG tablet Take 25 mg daily by mouth.    [provider]  torsemide (DEMADEX) 20 MG tablet Take 20 mg daily by mouth.    [provider]  ULORIC 80 MG TABS TAKE ONE (1) TABLET EACH DAY - STOP TAKING ALLOPURINOL. 03/09/17   Birdie Sons, MD  Vitamin D, Ergocalciferol, (DRISDOL) 50000 units CAPS capsule Take 1 capsule (50,000 Units total) every 7 (seven) days by mouth. 04/08/17   Birdie Sons, MD      VITAL SIGNS:  Blood pressure 113/70, pulse 91, temperature 98 F (36.7 C), temperature source Oral, resp. rate 16, weight 86.2 kg (190 lb), SpO2 99 %.  PHYSICAL EXAMINATION:  Physical Exam  GENERAL:  82 y.o.-year-old patient lying in the bed with no acute distress.  EYES: Pupils equal, round, reactive to light and accommodation. No scleral icterus. Extraocular muscles intact.  HEENT: Head atraumatic, normocephalic. Oropharynx and nasopharynx clear.  NECK:  Supple, no jugular venous distention. No thyroid enlargement, no tenderness.  LUNGS: Normal breath sounds bilaterally, no wheezing, rales,rhonchi or crepitation. No use of accessory muscles of respiration.  CARDIOVASCULAR: S1, S2 normal. No murmurs, rubs, or gallops.  ABDOMEN: Soft, nontender, nondistended. Bowel sounds present. No organomegaly or mass.  EXTREMITIES: No pedal edema, cyanosis, or clubbing.  NEUROLOGIC: Non-focal exam. Gait not checked. Overall difficult to evaluate as he's somnolent from Ativan and Anti seizure meds PSYCHIATRIC: The patient is somnolent and not arousable. SKIN: No obvious rash, lesion, or ulcer.   LABORATORY PANEL:   CBC Recent Labs  Lab 08/06/17 0402  WBC 6.6  HGB 13.9  HCT 41.2  PLT 238   ------------------------------------------------------------------------------------------------------------------  Chemistries  Recent Labs  Lab 08/06/17 0402  NA 140    K 3.7  CL 103  CO2 27  GLUCOSE 141*  BUN 33*  CREATININE 2.08*  CALCIUM 9.3  AST 27  ALT 17  ALKPHOS 55  BILITOT 0.5   ------------------------------------------------------------------------------------------------------------------  Cardiac Enzymes Recent Labs  Lab 08/06/17 0402  TROPONINI <0.03   ------------------------------------------------------------------------------------------------------------------  RADIOLOGY:  Ct Head Wo Contrast  Result Date: 08/06/2017 CLINICAL DATA:  Acute onset of seizure like activity.  Somnolence. EXAM: CT HEAD WITHOUT CONTRAST TECHNIQUE: Contiguous axial images were obtained from the base of the skull through the vertex without intravenous contrast. COMPARISON:  CT of the head performed 01/21/2017, and MRI of the brain performed 01/22/2017 FINDINGS: Brain: No evidence of acute infarction, hemorrhage, hydrocephalus, extra-axial collection or mass lesion / mass effect. Prominence of the ventricles and sulci reflects moderate cortical volume loss. Cerebellar atrophy is noted. Mild periventricular white matter change likely reflects small vessel ischemic microangiopathy. A chronic lacunar infarct is noted  at the left basal ganglia. The brainstem and fourth ventricle are within normal limits. The cerebral hemispheres demonstrate grossly normal gray-white differentiation. No mass effect or midline shift is seen. Vascular: No hyperdense vessel or unexpected calcification. Skull: There is no evidence of fracture; visualized osseous structures are unremarkable in appearance. Sinuses/Orbits: The visualized portions of the orbits are within normal limits. The paranasal sinuses and mastoid air cells are well-aerated. Other: No significant soft tissue abnormalities are seen. IMPRESSION: 1. No acute intracranial pathology seen on CT. 2. Moderate cortical volume loss and scattered small vessel ischemic microangiopathy. 3. Chronic lacunar infarct at the left basal  ganglia. Electronically Signed   By: Garald Balding M.D.   On: 08/06/2017 04:53   Dg Chest Port 1 View  Result Date: 08/06/2017 CLINICAL DATA:  Seizure EXAM: PORTABLE CHEST 1 VIEW COMPARISON:  Chest radiograph 01/21/2017 FINDINGS: Unchanged cardiomegaly. Remote median sternotomy for CABG. Chronic interstitial prominence without focal consolidation. No pulmonary edema, pleural effusion or pneumothorax. IMPRESSION: Unchanged cardiomegaly without acute airspace disease. Electronically Signed   By: Ulyses Jarred M.D.   On: 08/06/2017 04:18   IMPRESSION AND PLAN:  32 y m with seizure  * Seizure - loaded with Keppra & 1 mg IV Ativan by EDP - Seizure precautions - Neuro c/s - EEG per Neuro - Start Keppra  - CT head neg - UDS + for opiates  * Acute on CKD 3 - hydrate and monitor - avoid nephrotoxic meds  * A.fib - rate controlled with metoprolol - continue Eliquis for anticoagulation  * Lactic Acidosis - likely due to seizure activity - no other s/s of infection - monitor    All the records are reviewed and case discussed with ED provider. Management plans discussed with the patient, family (Wife at bedside) and they are in agreement.  CODE STATUS: FULL CODE  TOTAL TIME TAKING CARE OF THIS PATIENT: 45 minutes.    Max Sane M.D on 08/06/2017 at 5:58 AM  Between 7am to 6pm - Pager - [phone]  After 6pm go to [email or web address] - Proofreader  [facility]  Office  [phone]  CC: Primary care physician; Birdie Sons, MD   Note: This dictation was prepared with Dragon dictation along with smaller phrase technology. Any transcriptional errors that result from this process are unintentional.

## 2017-08-06 NOTE — Progress Notes (Signed)
MEDICATION RELATED CONSULT NOTE - INITIAL   Pharmacy Consult for anti-seizure drug interaction and monitoring Indication: Keppra/seizures  Allergies  Allergen Reactions  . Decongestant  [Oxymetazoline]     Patient Measurements: Height: 6' (182.9 cm) Weight: 189 lb (85.7 kg) IBW/kg (Calculated) : 77.6 Adjusted Body Weight:   Vital Signs: Temp: 98.4 F (36.9 C) (03/15 0646) Temp Source: Oral (03/15 0646) BP: 123/83 (03/15 0646) Pulse Rate: 103 (03/15 0646) Intake/Output from previous day: 03/14 0701 - 03/15 0700 In: 1105 [IV Piggyback:1105] Out: -  Intake/Output from this shift: No intake/output data recorded.  Labs: Recent Labs    08/06/17 0402  WBC 6.6  HGB 13.9  HCT 41.2  PLT 238  CREATININE 2.08*  ALBUMIN 4.5  PROT 7.6  AST 27  ALT 17  ALKPHOS 55  BILITOT 0.5   Estimated Creatinine Clearance: 31.1 mL/min (A) (by C-G formula based on SCr of 2.08 mg/dL (H)).   Microbiology: No results found for this or any previous visit (from the past 720 hour(s)).  Medical History: Past Medical History:  Diagnosis Date  . Acute encephalopathy 09/03/2015  . Atrial fibrillation and flutter [facility]) 2013   Dr Nehemiah Massed at [facility]  on [address].   . C. difficile colitis 09/12/2015  . COPD (chronic obstructive pulmonary disease) ([place])   . HCAP (healthcare-associated pneumonia) 09/12/2015  . Hyperlipidemia   . Hypertension   . Kidney disease, chronic, stage III (moderate, EGFR 30-59 ml/min) ([place])    ARF 2013  . OSA (obstructive sleep apnea)    non-compliant with CPAP.   Marland Kitchen Severe sepsis with septic shock ([place]) 09/12/2015    Medications:  Scheduled:  . apixaban  2.5 mg Oral BID  . aspirin EC  81 mg Oral Daily  . docusate sodium  100 mg Oral BID  . ferrous sulfate  325 mg Oral BID WC  . folic acid  1 mg Oral Daily  . levETIRAcetam  500 mg Oral BID  . metoprolol succinate  12.5 mg Oral Daily  . multivitamin with minerals  1 tablet Oral Daily  . polyethylene glycol  17 g Oral  Daily  . senna  1 tablet Oral BID  . spironolactone  25 mg Oral Daily  . thiamine  100 mg Oral Daily  . torsemide  20 mg Oral Daily  . Vitamin D (Ergocalciferol)  50,000 Units Oral Q7 days    Assessment: 77 yom presents to ED for seizure that woke wife from sleep. Patient had been taking oxcarbazepine several years ago per Dr. Lannie Fields notes but that was stopped because he didn't have further seizures.  Goal of Therapy:  Seizure freedom Prevent ADE  Plan:  Admitting prescriber started Keppra. No significant drug interactions identified. Consider adding PRN benzodiazepines for seizures if needed. Monitor for effectiveness. Neurology consult pending. Pharmacy will continue to follow.  Laural Benes, Pharm.D., BCPS Clinical Pharmacist 08/06/2017,7:49 AM

## 2017-08-06 NOTE — ED Notes (Signed)
Dr Beather Arbour made aware of pt's elevated Lactic Acid level as reported by lab at this time: 2.66mmol/L

## 2017-08-06 NOTE — ED Notes (Signed)
Dr Beather Arbour made aware of pt's elevated Lactic acid

## 2017-08-06 NOTE — ED Provider Notes (Signed)
 [facility] Emergency Department Provider Note   ____________________________________________   First MD Initiated Contact with Patient 08/06/17 0356     (approximate)  I have reviewed the triage vital signs and the nursing notes.   HISTORY  Chief Complaint Seizures  Level 5 caveat: Patient confused and unable to provide much history  HPI Vincent Mason is a 82 y.o. male brought to the ED from home via EMS with a chief complaint of seizure-like activity.  Reportedly wife awoke with patient having seizure-like activity next to her.  Upon EMS arrival, patient was somnolent and hard to arouse.  Subsequently patient became combative for EMS.  Arrives to the ED with room air saturations 84%, calm and responsive.  Evidence of urinary incontinence.  States he feels fine and does not know what happened.  Denies recent fever, chills, chest pain, shortness of breath, abdominal pain, nausea, vomiting.  Denies recent travel or trauma.  Per records, it looks like patient takes Eliquis for atrial fibrillation.  Also noted patient was seen by Dr. Potter from neurology as recent as 2016 and placed on Trileptal for seizures.  Does not appear he is still taking this.  Will wait for wife to come for further clarification.   Past Medical History:  Diagnosis Date  . Acute encephalopathy 09/03/2015  . Atrial fibrillation and flutter (HCC) 2013   Dr Kowalski at [facility]  on Xarelto.   . C. difficile colitis 09/12/2015  . COPD (chronic obstructive pulmonary disease) (HCC)   . HCAP (healthcare-associated pneumonia) 09/12/2015  . Hyperlipidemia   . Hypertension   . Kidney disease, chronic, stage III (moderate, EGFR 30-59 ml/min) (HCC)    ARF 2013  . OSA (obstructive sleep apnea)    non-compliant with CPAP.   . Severe sepsis with septic shock (HCC) 09/12/2015    Patient Active Problem List   Diagnosis Date Noted  . Vitamin D deficiency 04/08/2017  . Neuropathy 04/08/2017  . Altered  mental status 01/21/2017  . Blood in stool   . Anemia due to chronic blood loss   . Hypomagnesemia 09/14/2015  . Chronic atrial fibrillation (HCC) 09/12/2015  . Essential hypertension 09/12/2015  . Fecal occult blood test positive 09/12/2015  . Nodular hyperplasia of liver   . Normocytic anemia 09/03/2015  . Opioid dependence (HCC) 09/03/2015  . Hypokalemia 08/26/2015  . Hyperuricemia 07/09/2015  . Coronary artery disease 01/22/2015  . Chronic kidney disease (CKD), stage III (moderate) (HCC) 01/22/2015  . Chronic constipation 01/22/2015  . COPD (chronic obstructive pulmonary disease) (HCC) 01/22/2015  . ED (erectile dysfunction) of organic origin 01/22/2015  . Lymphedema of both lower extremities 01/22/2015  . Fatigue 01/22/2015  . Blood in the urine 01/22/2015  . Calculus of kidney 01/22/2015  . Memory loss 01/22/2015  . Hypertensive pulmonary vascular disease (HCC) 01/22/2015  . Displacement of cervical intervertebral disc without myelopathy 01/22/2015  . Seizures (HCC) 01/17/2015  . Obstructive sleep apnea 01/17/2015  . Hyperlipidemia 01/17/2015  . Leg swelling 01/17/2015  . Degenerative disc disease, lumbar 01/17/2015  . Back pain 11/12/2014  . TI (tricuspid incompetence) 08/02/2014    Past Surgical History:  Procedure Laterality Date  . CATARACT EXTRACTION    . CORONARY ARTERY BYPASS GRAFT  10/1997  . history of cervical discectomy  2009   C5-C6  . Hyperplastic colon polyp  02/2002   Sigmoid polyps    Prior to Admission medications   Medication Sig Start Date End Date Taking? Authorizing Provider  apixaban (ELIQUIS)   5 MG TABS tablet Take 1 tablet (5 mg total) by mouth 2 (two) times daily. 01/22/17   [name], MD  aspirin EC 81 MG EC tablet Take 1 tablet (81 mg total) by mouth daily. Patient not taking: Reported on 07/09/2017 01/23/17   [name], MD  ferrous sulfate 325 (65 FE) MG tablet Take 1 tablet (325 mg total) by mouth 2 (two) times  daily with a meal. 12/07/16   Gladstone Lighter, MD  methocarbamol (ROBAXIN) 750 MG tablet Take 750 mg by mouth at bedtime as needed for muscle spasms.    [provider]  metolazone (ZAROXOLYN) 5 MG tablet Take 5 mg by mouth daily as needed (swelling).  12/17/16   [provider]  metoprolol succinate (TOPROL-XL) 25 MG 24 hr tablet Take 0.5 tablets (12.5 mg total) by mouth daily. 01/23/17   [name], MD  oxyCODONE (ROXICODONE) 15 MG immediate release tablet Take 1 tablet (15 mg total) by mouth every 6 (six) hours as needed for pain. 07/14/17   Birdie Sons, MD  polyethylene glycol [facility] Floria Raveling) packet Take 17 g by mouth daily. 12/08/16   Gladstone Lighter, MD  pramipexole (MIRAPEX) 0.25 MG tablet TAKE ONE TO TWO TABLETS BY MOUTH AT BEDTIME AS NEEDED FOR RESTLESS LEG 07/21/16   Birdie Sons, MD  ranitidine (ZANTAC) 150 MG tablet Take 150 mg by mouth 2 (two) times daily. 12/18/16   [provider]  spironolactone (ALDACTONE) 25 MG tablet Take 25 mg daily by mouth.    [provider]  torsemide (DEMADEX) 20 MG tablet Take 20 mg daily by mouth.    [provider]  ULORIC 80 MG TABS TAKE ONE (1) TABLET EACH DAY - STOP TAKING ALLOPURINOL. 03/09/17   Birdie Sons, MD  Vitamin D, Ergocalciferol, (DRISDOL) 50000 units CAPS capsule Take 1 capsule (50,000 Units total) every 7 (seven) days by mouth. 04/08/17   Birdie Sons, MD    Allergies Decongestant  [oxymetazoline]  Family History  Problem Relation Age of Onset  . Hypertension Mother   . Hypertension Father     Social History Social History   Tobacco Use  . Smoking status: Former Research scientist (life sciences)  . Smokeless tobacco: Never Used  . Tobacco comment: Quit in 1990  Substance Use Topics  . Alcohol use: No  . Drug use: No    Review of Systems  Constitutional: No fever/chills. Eyes: No visual changes. ENT: No sore throat. Cardiovascular: Denies chest pain. Respiratory:  Denies shortness of breath. Gastrointestinal: No abdominal pain.  No nausea, no vomiting.  No diarrhea.  No constipation. Genitourinary: Negative for dysuria. Musculoskeletal: Negative for back pain. Skin: Negative for rash. Neurological: Positive for seizure.  Negative for headaches, focal weakness or numbness.   ____________________________________________   PHYSICAL EXAM:  VITAL SIGNS: ED Triage Vitals  Enc Vitals Group     BP 08/06/17 0356 (!) 161/70     Pulse Rate 08/06/17 0356 (!) 117     Resp 08/06/17 0356 18     Temp 08/06/17 0356 98 F (36.7 C)     Temp Source 08/06/17 0356 Oral     SpO2 08/06/17 0356 96 %     Weight 08/06/17 0357 190 lb (86.2 kg)     Height --      Head Circumference --      Peak Flow --      Pain Score --      Pain Loc --      Pain  Edu? --      Excl. in GC? --     Constitutional: Alert and oriented. Well appearing and in no acute distress. Eyes: Conjunctivae are normal. PERRL. EOMI. Head: Atraumatic. Nose: No congestion/rhinnorhea. Mouth/Throat: Did not bite tongue.  Mucous membranes are moist.  Oropharynx non-erythematous. Neck: No stridor.  Supple neck without meningismus. Cardiovascular: Tachycardic rate, irregular rhythm. Grossly normal heart sounds.  Good peripheral circulation. Respiratory: Normal respiratory effort.  No retractions. Lungs CTAB. Gastrointestinal: Soft and nontender. No distention. No abdominal bruits. No CVA tenderness. Musculoskeletal: No lower extremity tenderness. BLE venous stasis changes and 1+ nonpittingedema.  No joint effusions. Neurologic: Alert and oriented to person and place.  Normal speech and language. No gross focal neurologic deficits are appreciated. MAEx4. Skin:  Skin is warm, dry and intact. No rash noted. Psychiatric: Mood and affect are normal. Speech and behavior are normal.  ____________________________________________   LABS (all labs ordered are listed, but only abnormal results are  displayed)  Labs Reviewed  COMPREHENSIVE METABOLIC PANEL - Abnormal; Notable for the following components:      Result Value   Glucose, Bld 141 (*)    BUN 33 (*)    Creatinine, Ser 2.08 (*)    GFR calc non Af Amer 28 (*)    GFR calc Af Amer 33 (*)    All other components within normal limits  CBC WITH DIFFERENTIAL/PLATELET - Abnormal; Notable for the following components:   RBC 4.26 (*)    All other components within normal limits  LACTIC ACID, PLASMA - Abnormal; Notable for the following components:   Lactic Acid, Venous 2.6 (*)    All other components within normal limits  URINALYSIS, COMPLETE (UACMP) WITH MICROSCOPIC - Abnormal; Notable for the following components:   Color, Urine YELLOW (*)    APPearance CLEAR (*)    All other components within normal limits  GLUCOSE, CAPILLARY - Abnormal; Notable for the following components:   Glucose-Capillary 119 (*)    All other components within normal limits  TROPONIN I  PROTIME-INR  ETHANOL  LACTIC ACID, PLASMA  URINE DRUG SCREEN, QUALITATIVE ([facility] ONLY)  CBG MONITORING, ED   ____________________________________________  EKG  ED ECG REPORT I, , J, the attending physician, personally viewed and interpreted this ECG.   Date: 08/06/2017  EKG Time: 0356  Rate: 118  Rhythm: atrial fibrillation, rate 118  Axis: LAD  Intervals:left posterior fascicular block  ST&T Change: Nonspecific  ____________________________________________  RADIOLOGY  ED MD interpretation: No ICH, no pneumonia  Official radiology report(s): Ct Head Wo Contrast  Result Date: 08/06/2017 CLINICAL DATA:  Acute onset of seizure like activity.  Somnolence. EXAM: CT HEAD WITHOUT CONTRAST TECHNIQUE: Contiguous axial images were obtained from the base of the skull through the vertex without intravenous contrast. COMPARISON:  CT of the head performed 01/21/2017, and MRI of the brain performed 01/22/2017 FINDINGS: Brain: No evidence of acute infarction,  hemorrhage, hydrocephalus, extra-axial collection or mass lesion / mass effect. Prominence of the ventricles and sulci reflects moderate cortical volume loss. Cerebellar atrophy is noted. Mild periventricular white matter change likely reflects small vessel ischemic microangiopathy. A chronic lacunar infarct is noted at the left basal ganglia. The brainstem and fourth ventricle are within normal limits. The cerebral hemispheres demonstrate grossly normal gray-white differentiation. No mass effect or midline shift is seen. Vascular: No hyperdense vessel or unexpected calcification. Skull: There is no evidence of fracture; visualized osseous structures are unremarkable in appearance. Sinuses/Orbits: The visualized portions of the orbits are   within normal limits. The paranasal sinuses and mastoid air cells are well-aerated. Other: No significant soft tissue abnormalities are seen. IMPRESSION: 1. No acute intracranial pathology seen on CT. 2. Moderate cortical volume loss and scattered small vessel ischemic microangiopathy. 3. Chronic lacunar infarct at the left basal ganglia. Electronically Signed   By: Garald Balding M.D.   On: 08/06/2017 04:53   Dg Chest Port 1 View  Result Date: 08/06/2017 CLINICAL DATA:  Seizure EXAM: PORTABLE CHEST 1 VIEW COMPARISON:  Chest radiograph 01/21/2017 FINDINGS: Unchanged cardiomegaly. Remote median sternotomy for CABG. Chronic interstitial prominence without focal consolidation. No pulmonary edema, pleural effusion or pneumothorax. IMPRESSION: Unchanged cardiomegaly without acute airspace disease. Electronically Signed   By: Ulyses Jarred M.D.   On: 08/06/2017 04:18    ____________________________________________   PROCEDURES  Procedure(s) performed: None  Procedures  Critical Care performed: Yes, see critical care note(s)   CRITICAL CARE Performed by: Paulette Blanch   Total critical care time: 30 minutes  Critical care time was exclusive of separately billable  procedures and treating other patients.  Critical care was necessary to treat or prevent imminent or life-threatening deterioration.  Critical care was time spent personally by me on the following activities: development of treatment plan with patient and/or surrogate as well as nursing, discussions with consultants, evaluation of patient's response to treatment, examination of patient, obtaining history from patient or surrogate, ordering and performing treatments and interventions, ordering and review of laboratory studies, ordering and review of radiographic studies, pulse oximetry and re-evaluation of patient's condition.  ____________________________________________   INITIAL IMPRESSION / ASSESSMENT AND PLAN / ED COURSE  As part of my medical decision making, I reviewed the following data within the [facility] History obtained from family, Nursing notes reviewed and incorporated, Labs reviewed, EKG interpreted, Old chart reviewed, Radiograph reviewed, Discussed with admitting physician and Notes from prior ED visits   82 year old male brought to the ED via EMS for seizure-like activity. Differential diagnosis includes, but is not limited to, epilepsy, seizure, alcohol, illicit or prescription medications, or other toxic ingestion; intracranial pathology such as stroke or intracerebral hemorrhage; fever or infectious causes including sepsis; hypoxemia and/or hypercarbia; uremia; trauma; endocrine related disorders such as diabetes, hypoglycemia, and thyroid-related diseases; hypertensive encephalopathy; etc.  Heart rate bouncing between 96-108.  Will obtain screening lab work and urine, head CT and reassess.   Clinical Course as of Aug 07 519  Fri Aug 06, 2017  0518 Wife now at bedside.  Confirms that he is no longer taking Trileptal.  Sounds like he was lost to neurology follow-up since 2016.  Updated patient and spouse of all results.  Will discuss with hospitalist to  evaluate patient in the emergency department for admission.  [JS]  0521 Low-dose Lopressor given for heart rate 120s.  [JS]    Clinical Course User Index [JS] Paulette Blanch, MD     ____________________________________________   FINAL CLINICAL IMPRESSION(S) / ED DIAGNOSES  Final diagnoses:  Seizure [facility])  Atrial fibrillation with rapid ventricular response [facility])     ED Discharge Orders    None       Note:  This document was prepared using Dragon voice recognition software and may include unintentional dictation errors.    Paulette Blanch, MD 08/06/17 [phone]

## 2017-08-06 NOTE — ED Triage Notes (Signed)
Per EMS: patient's wife awoke to patient exhibiting seizure-like activity. At EMS arrival patient was somnolent, and hard to arouse. Patient then became combative for EMS.   Patient currently awake, alert, able to provide birth date and name. Patient disoriented to to time. Patient oriented to person, situation, and place.

## 2017-08-06 NOTE — Consult Note (Signed)
Reason for Consult:Seizure Referring Physician: Gouru  CC: Seizure  HPI: Vincent Mason is an 82 y.o. male who is amnestic of the presenting event and therefore is unable to provide any history.  Family not available therefor all history is obtained from the chart.  Reportedly wife awakemed with patient having seizure-like activity next to her. Upon EMS arrival, patient was somnolent and hard to arouse. Subsequently patient became combative for EMS. There was evidence of urinary incontinence per EDP.Patient now returning to baseline.  Patient has had previous events.  Was placed on Trileptal.   This was discontinued due to nonrecurrence.   Patient started on Keppra in the ED.     Past Medical History:  Diagnosis Date  . Acute encephalopathy 09/03/2015  . Atrial fibrillation and flutter [facility]) 2013   Dr Nehemiah Massed at [facility]  on [place].   . C. difficile colitis 09/12/2015  . COPD (chronic obstructive pulmonary disease) ([place])   . HCAP (healthcare-associated pneumonia) 09/12/2015  . Hyperlipidemia   . Hypertension   . Kidney disease, chronic, stage III (moderate, EGFR 30-59 ml/min) ([place])    ARF 2013  . OSA (obstructive sleep apnea)    non-compliant with CPAP.   Marland Kitchen Severe sepsis with septic shock ([place]) 09/12/2015    Past Surgical History:  Procedure Laterality Date  . CATARACT EXTRACTION    . CORONARY ARTERY BYPASS GRAFT  10/1997  . history of cervical discectomy  2009   C5-C6  . Hyperplastic colon polyp  02/2002   Sigmoid polyps    Family History  Problem Relation Age of Onset  . Hypertension Mother   . Hypertension Father     Social History:  reports that he has quit smoking. he has never used smokeless tobacco. He reports that he does not drink alcohol or use drugs.  Allergies  Allergen Reactions  . Decongestant  [Oxymetazoline]     Medications:  I have reviewed the patient's current medications. Prior to Admission:  Medications Prior to Admission  Medication Sig Dispense  Refill Last Dose  . apixaban (ELIQUIS) 5 MG TABS tablet Take 1 tablet (5 mg total) by mouth 2 (two) times daily. (Patient taking differently: Take 2.5 mg by mouth 2 (two) times daily. ) 60 tablet 0 08/05/2017 at Unknown time  . aspirin EC 81 MG EC tablet Take 1 tablet (81 mg total) by mouth daily. 30 tablet 0 08/05/2017 at Unknown time  . digoxin (LANOXIN) 0.125 MG tablet Take 0.125 mg by mouth daily.   08/05/2017 at Unknown time  . methocarbamol (ROBAXIN) 750 MG tablet Take 750 mg by mouth at bedtime as needed for muscle spasms.   Past Week at Unknown time  . oxyCODONE (ROXICODONE) 15 MG immediate release tablet Take 1 tablet (15 mg total) by mouth every 6 (six) hours as needed for pain. 120 tablet 0 Past Week at Unknown time  . polyethylene glycol (MIRALAX / GLYCOLAX) packet Take 17 g by mouth daily. 14 each 0 08/05/2017 at Unknown time  . ranitidine (ZANTAC) 150 MG tablet Take 150 mg by mouth 2 (two) times daily.   08/05/2017 at Unknown time  . spironolactone (ALDACTONE) 25 MG tablet Take 25 mg daily by mouth.   08/05/2017 at Unknown time  . torsemide (DEMADEX) 20 MG tablet Take 20 mg daily by mouth.   08/05/2017 at Unknown time  . ULORIC 80 MG TABS TAKE ONE (1) TABLET EACH DAY - STOP TAKING ALLOPURINOL. 30 tablet 11 08/05/2017 at Unknown time  . ferrous sulfate  325 (65 FE) MG tablet Take 1 tablet (325 mg total) by mouth 2 (two) times daily with a meal. (Patient not taking: Reported on 08/06/2017) 60 tablet 3 Not Taking at Unknown time  . metolazone (ZAROXOLYN) 5 MG tablet Take 5 mg by mouth daily as needed (swelling).    Not Taking at Unknown time  . metoprolol succinate (TOPROL-XL) 25 MG 24 hr tablet Take 0.5 tablets (12.5 mg total) by mouth daily. (Patient not taking: Reported on 08/06/2017) 30 tablet 0 Not Taking at Unknown time  . pramipexole (MIRAPEX) 0.25 MG tablet TAKE ONE TO TWO TABLETS BY MOUTH AT BEDTIME AS NEEDED FOR RESTLESS LEG (Patient not taking: Reported on 08/06/2017) 60 tablet 5 Not Taking  at Unknown time  . Vitamin D, Ergocalciferol, (DRISDOL) 50000 units CAPS capsule Take 1 capsule (50,000 Units total) every 7 (seven) days by mouth. (Patient not taking: Reported on 08/06/2017) 12 capsule 1 Not Taking at Unknown time   Scheduled: . apixaban  2.5 mg Oral BID  . aspirin EC  81 mg Oral Daily  . docusate sodium  100 mg Oral BID  . ferrous sulfate  325 mg Oral BID WC  . folic acid  1 mg Oral Daily  . levETIRAcetam  500 mg Oral BID  . metoprolol succinate  12.5 mg Oral Daily  . multivitamin with minerals  1 tablet Oral Daily  . polyethylene glycol  17 g Oral Daily  . senna  1 tablet Oral BID  . spironolactone  25 mg Oral Daily  . thiamine  100 mg Oral Daily  . torsemide  20 mg Oral Daily  . Vitamin D (Ergocalciferol)  50,000 Units Oral Q7 days    ROS: History obtained from the patient  General ROS: negative for - chills, fatigue, fever, night sweats, weight gain or weight loss Psychological ROS: negative for - behavioral disorder, hallucinations, memory difficulties, mood swings or suicidal ideation Ophthalmic ROS: negative for - blurry vision, double vision, eye pain or loss of vision ENT ROS: negative for - epistaxis, nasal discharge, oral lesions, sore throat, tinnitus or vertigo Allergy and Immunology ROS: negative for - hives or itchy/watery eyes Hematological and Lymphatic ROS: negative for - bleeding problems, bruising or swollen lymph nodes Endocrine ROS: negative for - galactorrhea, hair pattern changes, polydipsia/polyuria or temperature intolerance Respiratory ROS: negative for - cough, hemoptysis, shortness of breath or wheezing Cardiovascular ROS: negative for - chest pain, dyspnea on exertion, edema or irregular heartbeat Gastrointestinal ROS: negative for - abdominal pain, diarrhea, hematemesis, nausea/vomiting or stool incontinence Genito-Urinary ROS: negative for - dysuria, hematuria, incontinence or urinary frequency/urgency Musculoskeletal ROS: left thigh  pain Neurological ROS: as noted in HPI Dermatological ROS: negative for rash and skin lesion changes  Physical Examination: Blood pressure 105/79, pulse 74, temperature 98.4 F (36.9 C), temperature source Oral, resp. rate 18, height 6' (1.829 m), weight 85.7 kg (189 lb), SpO2 100 %.  HEENT-  Normocephalic, no lesions, without obvious abnormality.  Normal external eye and conjunctiva.  Normal TM's bilaterally.  Normal auditory canals and external ears. Normal external nose, mucus membranes and septum.  Normal pharynx. Cardiovascular- S1, S2 normal, pulses palpable throughout   Lungs- chest clear, no wheezing, rales, normal symmetric air entry Abdomen- soft, non-tender; bowel sounds normal; no masses,  no organomegaly Extremities- no edema and no pain on palpation of either leg Lymph-no adenopathy palpable Musculoskeletal-no joint tenderness, deformity or swelling Skin-warm and dry, no hyperpigmentation, vitiligo, or suspicious lesions  Neurological Examination   Mental  Status: Alert, oriented to name and place.  Amnestic of events.  Speech fluent without evidence of aphasia.  Able to follow 3 step commands but requires extensive reinforcement. Cranial Nerves: II: Discs flat bilaterally; Visual fields grossly normal, pupils equal, round, reactive to light and accommodation III,IV, VI: ptosis not present, extra-ocular motions intact bilaterally V,VII: smile symmetric, facial light touch sensation normal bilaterally VIII: hearing normal bilaterally IX,X: gag reflex present XI: bilateral shoulder shrug XII: midline tongue extension Motor: Right : Upper extremity   5/5    Left:     Upper extremity   5/5  Lower extremity   5/5     Lower extremity   4+/5 hip flexor weakness due to pain limitation Tone and bulk:normal tone throughout; no atrophy noted Sensory: Pinprick and light touch intact throughout, bilaterally Deep Tendon Reflexes: 1+ triceps and absent otherwise Plantars: Right:  mute   Left: mute Cerebellar: Normal finger-to-nose and normal heel-to-shin testing bilaterally Gait: not tested due to safety concerns    Laboratory Studies:   Basic Metabolic Panel: Recent Labs  Lab 08/06/17 0402  NA 140  K 3.7  CL 103  CO2 27  GLUCOSE 141*  BUN 33*  CREATININE 2.08*  CALCIUM 9.3    Liver Function Tests: Recent Labs  Lab 08/06/17 0402  AST 27  ALT 17  ALKPHOS 55  BILITOT 0.5  PROT 7.6  ALBUMIN 4.5   No results for input(s): LIPASE, AMYLASE in the last 168 hours. No results for input(s): AMMONIA in the last 168 hours.  CBC: Recent Labs  Lab 08/06/17 0402  WBC 6.6  NEUTROABS 4.5  HGB 13.9  HCT 41.2  MCV 96.7  PLT 238    Cardiac Enzymes: Recent Labs  Lab 08/06/17 0402  TROPONINI <0.03    BNP: Invalid input(s): POCBNP  CBG: Recent Labs  Lab 08/06/17 0401  GLUCAP 119*    Microbiology: Results for orders placed or performed during the hospital encounter of 12/02/16  Aerobic/Anaerobic Culture (surgical/deep wound)     Status: None   Collection Time: 12/02/16 12:53 PM  Result Value Ref Range Status   Specimen Description LEG LEFT  Final   Special Requests Normal  Final   Gram Stain   Final    RARE WBC PRESENT,BOTH PMN AND MONONUCLEAR ABUNDANT GRAM NEGATIVE RODS RARE GRAM POSITIVE COCCI IN PAIRS RARE GRAM VARIABLE ROD    Culture   Final    ABUNDANT PROVIDENCIA RETTGERI NO ANAEROBES ISOLATED    Report Status 12/07/2016 FINAL  Final   Organism ID, Bacteria PROVIDENCIA RETTGERI  Final      Susceptibility   Providencia rettgeri - MIC*    AMPICILLIN RESISTANT Resistant     CEFAZOLIN >=64 RESISTANT Resistant     CEFEPIME <=1 SENSITIVE Sensitive     CEFTAZIDIME <=1 SENSITIVE Sensitive     CEFTRIAXONE <=1 SENSITIVE Sensitive     CIPROFLOXACIN <=0.25 SENSITIVE Sensitive     GENTAMICIN <=1 SENSITIVE Sensitive     IMIPENEM 1 SENSITIVE Sensitive     TRIMETH/SULFA <=20 SENSITIVE Sensitive     AMPICILLIN/SULBACTAM 16  INTERMEDIATE Intermediate     PIP/TAZO <=4 SENSITIVE Sensitive     * ABUNDANT PROVIDENCIA RETTGERI  MRSA PCR Screening     Status: None   Collection Time: 12/04/16 12:30 AM  Result Value Ref Range Status   MRSA by PCR NEGATIVE NEGATIVE Final    Comment:        The GeneXpert MRSA Assay (FDA approved for NASAL specimens only),  is one component of a comprehensive MRSA colonization surveillance program. It is not intended to diagnose MRSA infection nor to guide or monitor treatment for MRSA infections.     Coagulation Studies: Recent Labs    08/06/17 0402  LABPROT 13.6  INR 1.05    Urinalysis:  Recent Labs  Lab 08/06/17 0402  COLORURINE YELLOW*  LABSPEC 1.010  PHURINE 7.0  GLUCOSEU NEGATIVE  HGBUR NEGATIVE  BILIRUBINUR NEGATIVE  KETONESUR NEGATIVE  PROTEINUR NEGATIVE  NITRITE NEGATIVE  LEUKOCYTESUR NEGATIVE    Lipid Panel:     Component Value Date/Time   CHOL 176 01/22/2017 1305   CHOL 104 09/01/2011 0542   TRIG 98 01/22/2017 1305   TRIG 88 09/01/2011 0542   HDL 44 01/22/2017 1305   HDL 42 09/01/2011 0542   CHOLHDL 4.0 01/22/2017 1305   VLDL 20 01/22/2017 1305   VLDL 18 09/01/2011 0542   LDLCALC 112 (H) 01/22/2017 1305   LDLCALC 44 09/01/2011 0542    HgbA1C:  Lab Results  Component Value Date   HGBA1C 6.2 (H) 01/17/2015    Urine Drug Screen:      Component Value Date/Time   LABOPIA POSITIVE (A) 08/06/2017 0402   LABOPIA POSITIVE (A) 09/03/2015 0345   COCAINSCRNUR NONE DETECTED 08/06/2017 0402   LABBENZ NONE DETECTED 08/06/2017 0402   LABBENZ NONE DETECTED 09/03/2015 0345   AMPHETMU NONE DETECTED 08/06/2017 0402   AMPHETMU NONE DETECTED 09/03/2015 0345   THCU NONE DETECTED 08/06/2017 0402   THCU NONE DETECTED 09/03/2015 0345   LABBARB NONE DETECTED 08/06/2017 0402   LABBARB NONE DETECTED 09/03/2015 0345    Alcohol Level:  Recent Labs  Lab 08/06/17 0402  ETH <10    Other results: EKG: atrial fibrillation, rate 118 bpm.  Imaging: Ct  Head Wo Contrast  Result Date: 08/06/2017 CLINICAL DATA:  Acute onset of seizure like activity.  Somnolence. EXAM: CT HEAD WITHOUT CONTRAST TECHNIQUE: Contiguous axial images were obtained from the base of the skull through the vertex without intravenous contrast. COMPARISON:  CT of the head performed 01/21/2017, and MRI of the brain performed 01/22/2017 FINDINGS: Brain: No evidence of acute infarction, hemorrhage, hydrocephalus, extra-axial collection or mass lesion / mass effect. Prominence of the ventricles and sulci reflects moderate cortical volume loss. Cerebellar atrophy is noted. Mild periventricular white matter change likely reflects small vessel ischemic microangiopathy. A chronic lacunar infarct is noted at the left basal ganglia. The brainstem and fourth ventricle are within normal limits. The cerebral hemispheres demonstrate grossly normal gray-white differentiation. No mass effect or midline shift is seen. Vascular: No hyperdense vessel or unexpected calcification. Skull: There is no evidence of fracture; visualized osseous structures are unremarkable in appearance. Sinuses/Orbits: The visualized portions of the orbits are within normal limits. The paranasal sinuses and mastoid air cells are well-aerated. Other: No significant soft tissue abnormalities are seen. IMPRESSION: 1. No acute intracranial pathology seen on CT. 2. Moderate cortical volume loss and scattered small vessel ischemic microangiopathy. 3. Chronic lacunar infarct at the left basal ganglia. Electronically Signed   By: Garald Balding M.D.   On: 08/06/2017 04:53   Dg Chest Port 1 View  Result Date: 08/06/2017 CLINICAL DATA:  Seizure EXAM: PORTABLE CHEST 1 VIEW COMPARISON:  Chest radiograph 01/21/2017 FINDINGS: Unchanged cardiomegaly. Remote median sternotomy for CABG. Chronic interstitial prominence without focal consolidation. No pulmonary edema, pleural effusion or pneumothorax. IMPRESSION: Unchanged cardiomegaly without acute  airspace disease. Electronically Signed   By: Ulyses Jarred M.D.   On: 08/06/2017 04:18  Dg Hip Unilat With Pelvis 2-3 Views Left  Result Date: 08/06/2017 CLINICAL DATA:  Left thigh pain today without reported injury. EXAM: DG HIP (WITH OR WITHOUT PELVIS) 2-3V LEFT COMPARISON:  None. FINDINGS: There is no evidence of acute fracture or hip dislocation. There is at most mild hip joint space narrowing and acetabular spurring bilaterally. Atherosclerotic vascular calcifications are noted. IMPRESSION: No acute osseous abnormality. Electronically Signed   By: Logan Bores M.D.   On: 08/06/2017 14:45     Assessment/Plan: 82 year old male presenting after seizure.  Patient started on Keppra.  Appears to be tolerating well.  Head CT reviewed and shows no acute changes.  Patient has had MRI in the past with no etiology for seizure noted.  Unable to have contrast MRI at this time due to renal function.  EEG shows bitemporal sharp activity.    Recommendations: 1.  Continue Keppra at 566m BID.  Changed to po today. 2.  PT evaluation for gait 3.  Hip films performed to rule out fracture.  No fracture noted.   4.  EEG 5.  Seizure precautions 5.  Patient unable to drive, operate heavy machinery, perform activities at heights and participate in water activities until release by outpatient physician.  Patient to follow up with Dr. PMelrose Nakayama     LAlexis Goodell MD Neurology [phone]/15/2019, 3:15 PM

## 2017-08-07 DIAGNOSIS — I482 Chronic atrial fibrillation: Secondary | ICD-10-CM | POA: Diagnosis not present

## 2017-08-07 DIAGNOSIS — J449 Chronic obstructive pulmonary disease, unspecified: Secondary | ICD-10-CM | POA: Diagnosis not present

## 2017-08-07 DIAGNOSIS — E872 Acidosis: Secondary | ICD-10-CM | POA: Diagnosis not present

## 2017-08-07 DIAGNOSIS — N179 Acute kidney failure, unspecified: Secondary | ICD-10-CM | POA: Diagnosis not present

## 2017-08-07 DIAGNOSIS — R569 Unspecified convulsions: Secondary | ICD-10-CM | POA: Diagnosis not present

## 2017-08-07 DIAGNOSIS — I4891 Unspecified atrial fibrillation: Secondary | ICD-10-CM | POA: Diagnosis not present

## 2017-08-07 DIAGNOSIS — E785 Hyperlipidemia, unspecified: Secondary | ICD-10-CM | POA: Diagnosis not present

## 2017-08-07 LAB — BASIC METABOLIC PANEL
Anion gap: 7 (ref 5–15)
BUN: 26 mg/dL — ABNORMAL HIGH (ref 6–20)
CO2: 23 mmol/L (ref 22–32)
Calcium: 8.9 mg/dL (ref 8.9–10.3)
Chloride: 110 mmol/L (ref 101–111)
Creatinine, Ser: 1.58 mg/dL — ABNORMAL HIGH (ref 0.61–1.24)
GFR calc Af Amer: 46 mL/min — ABNORMAL LOW (ref >60)
GFR calc non Af Amer: 40 mL/min — ABNORMAL LOW (ref >60)
Glucose, Bld: 90 mg/dL (ref 65–99)
Potassium: 4 mmol/L (ref 3.5–5.1)
Sodium: 140 mmol/L (ref 135–145)

## 2017-08-07 LAB — TSH: TSH: 4.085 u[IU]/mL (ref 0.350–4.500)

## 2017-08-07 MED ORDER — ORAL CARE MOUTH RINSE
15.0000 mL | Freq: Two times a day (BID) | OROMUCOSAL | Status: DC
  Administered 2017-08-07: 10:00:00 15 mL via OROMUCOSAL

## 2017-08-07 MED ORDER — LEVETIRACETAM 500 MG PO TABS: 500.0000 mg | ORAL_TABLET | Freq: Two times a day (BID) | ORAL | 0 refills | Status: DC

## 2017-08-07 MED ORDER — POLYETHYLENE GLYCOL 3350 17 G PO PACK: 17.0000 g | PACK | Freq: Every day | ORAL | Status: DC

## 2017-08-07 NOTE — Progress Notes (Signed)
MD order received to discharge pt home today; verbally reviewed AVS with pt; Rx for Keppra electronically submitted to [facility]; no questions voiced at this time; pt discharged via wheelchair by auxillary to the visitor's entrance

## 2017-08-07 NOTE — Discharge Instructions (Signed)
Do not drive, operate heavy machinery, perform activities at heights and participate in water activities until release by outpatient Neurologist.

## 2017-08-09 ENCOUNTER — Telehealth: Payer: Self-pay

## 2017-08-09 NOTE — Telephone Encounter (Signed)
Transition Care Management Follow-Up Telephone Call   Date discharged and where: [facility] on  How have you been since you were released from the hospital? Doing fine and feeling back to normal. Declines any current s/s.   Any patient concerns? none  Items Reviewed:   Meds: Pt declined confirming meds at this time. Recommended that he bring an UTD list to f/u apt on 08/13/17.  Allergies: verified  Dietary Changes Reviewed: N/A  Functional Questionnaire:  Independent-I Dependent-D  ADLs:   Dressing-  I    Eating- I   Maintaining continence- I   Transferring- I   Transportation- I   Meal Prep- I   Managing Meds- D   Confirmed importance and Date/Time of follow-up visits scheduled: 08/13/17 @ 3:40 PM    Confirmed with patient if condition worsens to call PCP or go to the Emergency Dept. Patient was given office number and encouraged to call back with questions or concerns: YES

## 2017-08-11 ENCOUNTER — Other Ambulatory Visit: Payer: Self-pay | Admitting: Family Medicine

## 2017-08-11 DIAGNOSIS — M5136 Other intervertebral disc degeneration, lumbar region: Secondary | ICD-10-CM

## 2017-08-11 MED ORDER — OXYCODONE HCL 15 MG PO TABS: 15.0000 mg | ORAL_TABLET | Freq: Four times a day (QID) | ORAL | 0 refills | Status: DC | PRN

## 2017-08-11 MED ORDER — METOPROLOL SUCCINATE ER 25 MG PO TB24: 12.5000 mg | ORAL_TABLET | Freq: Every day | ORAL | 5 refills | Status: DC

## 2017-08-11 NOTE — Telephone Encounter (Signed)
Patient is requesting a refill on the following medication  oxyCODONE (ROXICODONE) 15 MG immediate release tablet  He uses [facility].

## 2017-08-11 NOTE — Telephone Encounter (Signed)
Vincent Mason is calling requesting a new order for the following medication  metoprolol succinate (TOPROL-XL) 25 MG 24 hr tablet

## 2017-08-13 ENCOUNTER — Ambulatory Visit (INDEPENDENT_AMBULATORY_CARE_PROVIDER_SITE_OTHER): Payer: Medicare Other | Admitting: Family Medicine

## 2017-08-13 ENCOUNTER — Encounter: Payer: Self-pay | Admitting: Family Medicine

## 2017-08-13 VITALS — BP 106/60 | HR 80 | Temp 97.6°F | Resp 18 | Wt 185.0 lb

## 2017-08-13 DIAGNOSIS — R569 Unspecified convulsions: Secondary | ICD-10-CM | POA: Diagnosis not present

## 2017-08-13 NOTE — Progress Notes (Signed)
Patient: Vincent Mason Male    DOB: 06-Feb-1936   82 y.o.   MRN: [id number] Visit Date: 08/13/2017  Today's Provider: Lelon Huh, MD   Chief Complaint  Patient presents with  . Hospitalization Follow-up   Subjective:    HPI  Follow up Hospitalization  Patient was admitted to [facility] on 08/06/2017 and discharged on 08/07/2017. He was treated for Seizure which occurred while he was in bed. Patient does not recall seizure, but wife states he was jerking all over while in bed. He was confused after episode.  Treatment for this included starting patient on Keppra. He had abnormal EEG while in the hospital. Patient was advised to follow up with Neurology (Dr. Manuella Ghazi) 1 week after discharge for recurrent seizures. . Telephone follow up was done on 08/09/2017 He reports good compliance with treatment. He reports this condition is resolved He has felt fine since being home with no more seizure activity.  He has not set appointment with Dr. Manuella Ghazi yet. Marland Kitchen  ------------------------------------------------------------------------------------       Allergies  Allergen Reactions  . Decongestant  [Oxymetazoline]      Current Outpatient Medications:  .  apixaban (ELIQUIS) 5 MG TABS tablet, Take 1 tablet (5 mg total) by mouth 2 (two) times daily. (Patient taking differently: Take 2.5 mg by mouth 2 (two) times daily. ), Disp: 60 tablet, Rfl: 0 .  aspirin EC 81 MG EC tablet, Take 1 tablet (81 mg total) by mouth daily., Disp: 30 tablet, Rfl: 0 .  digoxin (LANOXIN) 0.125 MG tablet, Take 0.125 mg by mouth daily., Disp: , Rfl:  .  ferrous sulfate 325 (65 FE) MG tablet, Take 1 tablet (325 mg total) by mouth 2 (two) times daily with a meal., Disp: 60 tablet, Rfl: 3 .  levETIRAcetam (KEPPRA) 500 MG tablet, Take 1 tablet (500 mg total) by mouth 2 (two) times daily., Disp: 60 tablet, Rfl: 0 .  methocarbamol (ROBAXIN) 750 MG tablet, Take 750 mg by mouth at bedtime as needed for muscle spasms., Disp: ,  Rfl:  .  metolazone (ZAROXOLYN) 5 MG tablet, Take 5 mg by mouth daily as needed (swelling). , Disp: , Rfl:  .  metoprolol succinate (TOPROL-XL) 25 MG 24 hr tablet, Take 0.5 tablets (12.5 mg total) by mouth daily., Disp: 30 tablet, Rfl: 5 .  oxyCODONE (ROXICODONE) 15 MG immediate release tablet, Take 1 tablet (15 mg total) by mouth every 6 (six) hours as needed for pain., Disp: 120 tablet, Rfl: 0 .  polyethylene glycol (MIRALAX / GLYCOLAX) packet, Take 17 g by mouth daily., Disp: 14 each, Rfl: 0 .  ranitidine (ZANTAC) 150 MG tablet, Take 150 mg by mouth 2 (two) times daily., Disp: , Rfl:  .  spironolactone (ALDACTONE) 25 MG tablet, Take 25 mg daily by mouth., Disp: , Rfl:  .  torsemide (DEMADEX) 20 MG tablet, Take 20 mg daily by mouth., Disp: , Rfl:  .  ULORIC 80 MG TABS, TAKE ONE (1) TABLET EACH Vincent - STOP TAKING ALLOPURINOL., Disp: 30 tablet, Rfl: 11 .  Vitamin D, Ergocalciferol, (DRISDOL) 50000 units CAPS capsule, Take 1 capsule (50,000 Units total) every 7 (seven) days by mouth., Disp: 12 capsule, Rfl: 1  Review of Systems  Constitutional: Negative for appetite change, chills and fever.  Respiratory: Negative for chest tightness, shortness of breath and wheezing.   Cardiovascular: Negative for chest pain and palpitations.  Gastrointestinal: Negative for abdominal pain, nausea and vomiting.    Social History  Tobacco Use  . Smoking status: Former Research scientist (life sciences)  . Smokeless tobacco: Never Used  . Tobacco comment: Quit in 1990  Substance Use Topics  . Alcohol use: No   Objective:   BP 106/60 (BP Location: Left Arm, Patient Position: Sitting, Cuff Size: Large)   Pulse 80   Temp 97.6 F (36.4 C) (Oral)   Resp 18   Wt 185 lb (83.9 kg)   SpO2 99% Comment: room air  BMI 25.09 kg/m     Physical Exam   General Appearance:    Alert, cooperative, no distress  Eyes:    PERRL, conjunctiva/corneas clear, EOM's intact       Lungs:     Clear to auscultation bilaterally, respirations  unlabored  Heart:    Regular rate and rhythm  Neurologic:   Awake, alert, oriented x 3. No apparent focal neurological           defect.           Assessment & Plan:     1. Seizures ([place]) Second seizure, first in 2016. Previous followed by Dr. Melrose Nakayama but patient took himself off of [place]. Considering abnormal EEG he was strongly advised that he needs to stay on anti-seizure medications. Doing well with no seizure activity since discharge on Keppra.  - Ambulatory referral to Neurology       Lelon Huh, MD  [facility]

## 2017-08-15 NOTE — Discharge Summary (Signed)
[facility] [place] NAME: Vincent Mason    MR#:  [id number]  DATE OF BIRTH:  [birth year]  DATE OF ADMISSION:  08/06/2017 ADMITTING PHYSICIAN: Max Sane, MD  DATE OF DISCHARGE: 08/07/2017 10:30 AM  PRIMARY CARE PHYSICIAN: Birdie Sons, MD   ADMISSION DIAGNOSIS:  Seizure ([place]) [R56.9] Atrial fibrillation with rapid ventricular response ([place]) [I48.91]  DISCHARGE DIAGNOSIS:  Active Problems:   Seizure ([place])   SECONDARY DIAGNOSIS:   Past Medical History:  Diagnosis Date  . Acute encephalopathy 09/03/2015  . Atrial fibrillation and flutter [facility]) 2013   Dr Nehemiah Massed at [facility]  on [address].   . C. difficile colitis 09/12/2015  . COPD (chronic obstructive pulmonary disease) ([place])   . HCAP (healthcare-associated pneumonia) 09/12/2015  . Hyperlipidemia   . Hypertension   . Kidney disease, chronic, stage III (moderate, EGFR 30-59 ml/min) ([place])    ARF 2013  . OSA (obstructive sleep apnea)    non-compliant with CPAP.   Marland Kitchen Severe sepsis with septic shock ([place]) 09/12/2015     ADMITTING HISTORY  HISTORY OF PRESENT ILLNESS:  Vincent Mason  is a 82 y.o. male with a known history of A.fib, COPD, CKD 3 is being admitted for seizure. He was brought from home via EMS with a chief complaint of seizure-like activity. Reportedly wife awoke with patient having seizure-like activity next to her. Upon EMS arrival, patient was somnolent and hard to arouse. Subsequently patient became combative for EMS. While in the ED his room air saturations 84% and hadEvidence of urinary incontinence per EDP.Most the informations is obtained from [place], reviewing records and talking with wife at bedside as patient is somnolent s/p ativan.Per records, patient was seen by Dr. Melrose Nakayama from neurology as recent as 2016 and placed on Trileptal for seizures. Wife says he stopped taking this as he's not had seizure for a while.  HOSPITAL COURSE:   *Seizures Patient was taken off his  Keppra due to being seizure-free for prolonged duration.  Presented with seizure.  Patient had MRI of the brain and EEG done.  EEG showed some epileptiform waves.  He was restarted on Keppra neurology suggested continuing this as outpatient.  Outpatient follow-up with neurology.  No driving or operating heavy machinery until cleared by neurology as outpatient.  *Atrial fibrillation is rate controlled.  Continue metoprolol and Eliquis.  *Lactic acidosis secondary to seizures resolved with IV fluids.  *Acute kidney injury over CKD stage III due to dehydration has resolved.  Patient is stable for discharge home to follow-up with primary care physician and outpatient neurology.  CONSULTS OBTAINED:  Treatment Team:  Alexis Goodell, MD  DRUG ALLERGIES:   Allergies  Allergen Reactions  . Decongestant  [Oxymetazoline]     DISCHARGE MEDICATIONS:   Allergies as of 08/07/2017      Reactions   Decongestant  [oxymetazoline]       Medication List    STOP taking these medications   pramipexole 0.25 MG tablet Commonly known as:  MIRAPEX     TAKE these medications   apixaban 5 MG Tabs tablet Commonly known as:  ELIQUIS Take 1 tablet (5 mg total) by mouth 2 (two) times daily. What changed:  how much to take   aspirin 81 MG EC tablet Take 1 tablet (81 mg total) by mouth daily.   digoxin 0.125 MG tablet Commonly known as:  LANOXIN Take 0.125 mg by mouth daily.   ferrous sulfate 325 (65 FE) MG tablet Take 1  tablet (325 mg total) by mouth 2 (two) times daily with a meal.   levETIRAcetam 500 MG tablet Commonly known as:  KEPPRA Take 1 tablet (500 mg total) by mouth 2 (two) times daily.   methocarbamol 750 MG tablet Commonly known as:  ROBAXIN Take 750 mg by mouth at bedtime as needed for muscle spasms.   metolazone 5 MG tablet Commonly known as:  ZAROXOLYN Take 5 mg by mouth daily as needed (swelling).   polyethylene glycol packet Commonly known as:  MIRALAX / GLYCOLAX Take  17 g by mouth daily.   ranitidine 150 MG tablet Commonly known as:  ZANTAC Take 150 mg by mouth 2 (two) times daily.   spironolactone 25 MG tablet Commonly known as:  ALDACTONE Take 25 mg daily by mouth.   torsemide 20 MG tablet Commonly known as:  DEMADEX Take 20 mg daily by mouth.   ULORIC 80 MG Tabs Generic drug:  Febuxostat TAKE ONE (1) TABLET EACH DAY - STOP TAKING ALLOPURINOL.   Vitamin D (Ergocalciferol) 50000 units Caps capsule Commonly known as:  DRISDOL Take 1 capsule (50,000 Units total) every 7 (seven) days by mouth.       Today   VITAL SIGNS:  Blood pressure 119/70, pulse 79, temperature 97.6 F (36.4 C), temperature source Oral, resp. rate 19, height 6' (1.829 m), weight 85.7 kg (189 lb), SpO2 100 %.  I/O:  No intake or output data in the 24 hours ending 08/15/17 1403  PHYSICAL EXAMINATION:  Physical Exam  GENERAL:  82 y.o.-year-old patient lying in the bed with no acute distress.  LUNGS: Normal breath sounds bilaterally, no wheezing, rales,rhonchi or crepitation. No use of accessory muscles of respiration.  CARDIOVASCULAR: S1, S2 normal. No murmurs, rubs, or gallops.  ABDOMEN: Soft, non-tender, non-distended. Bowel sounds present. No organomegaly or mass.  NEUROLOGIC: Moves all 4 extremities. PSYCHIATRIC: The patient is alert and oriented x 3.  SKIN: No obvious rash, lesion, or ulcer.   DATA REVIEW:   CBC No results for input(s): WBC, HGB, HCT, PLT in the last 168 hours.  Chemistries  No results for input(s): NA, K, CL, CO2, GLUCOSE, BUN, CREATININE, CALCIUM, MG, AST, ALT, ALKPHOS, BILITOT in the last 168 hours.  Invalid input(s): GFRCGP  Cardiac Enzymes No results for input(s): TROPONINI in the last 168 hours.  Microbiology Results  Results for orders placed or performed during the hospital encounter of 12/02/16  Aerobic/Anaerobic Culture (surgical/deep wound)     Status: None   Collection Time: 12/02/16 12:53 PM  Result Value Ref Range  Status   Specimen Description LEG LEFT  Final   Special Requests Normal  Final   Gram Stain   Final    RARE WBC PRESENT,BOTH PMN AND MONONUCLEAR ABUNDANT GRAM NEGATIVE RODS RARE GRAM POSITIVE COCCI IN PAIRS RARE GRAM VARIABLE ROD    Culture   Final    ABUNDANT PROVIDENCIA RETTGERI NO ANAEROBES ISOLATED    Report Status 12/07/2016 FINAL  Final   Organism ID, Bacteria PROVIDENCIA RETTGERI  Final      Susceptibility   Providencia rettgeri - MIC*    AMPICILLIN RESISTANT Resistant     CEFAZOLIN >=64 RESISTANT Resistant     CEFEPIME <=1 SENSITIVE Sensitive     CEFTAZIDIME <=1 SENSITIVE Sensitive     CEFTRIAXONE <=1 SENSITIVE Sensitive     CIPROFLOXACIN <=0.25 SENSITIVE Sensitive     GENTAMICIN <=1 SENSITIVE Sensitive     IMIPENEM 1 SENSITIVE Sensitive     TRIMETH/SULFA <=20 SENSITIVE Sensitive  AMPICILLIN/SULBACTAM 16 INTERMEDIATE Intermediate     PIP/TAZO <=4 SENSITIVE Sensitive     * ABUNDANT PROVIDENCIA RETTGERI  MRSA PCR Screening     Status: None   Collection Time: 12/04/16 12:30 AM  Result Value Ref Range Status   MRSA by PCR NEGATIVE NEGATIVE Final    Comment:        The GeneXpert MRSA Assay (FDA approved for NASAL specimens only), is one component of a comprehensive MRSA colonization surveillance program. It is not intended to diagnose MRSA infection nor to guide or monitor treatment for MRSA infections.     RADIOLOGY:  No results found.  Follow up with PCP in 1 week.  Management plans discussed with the patient, family and they are in agreement.  CODE STATUS:  Code Status History    Date Active Date Inactive Code Status Order ID Comments User Context   08/06/2017 0639 08/07/2017 1336 Full Code [id number]  Max Sane, MD Inpatient   01/21/2017 1628 01/22/2017 2046 Full Code [id number]  Nicholes Mango, MD Inpatient   12/02/2016 0937 12/07/2016 2014 Full Code [id number]  Loletha Grayer, MD ED   09/03/2015 0558 09/17/2015 1637 Full Code [id number]  Lucious Groves,  DO Inpatient   01/17/2015 1056 01/18/2015 1352 Full Code [id number]  Theodoro Grist, MD ED      TOTAL TIME TAKING CARE OF THIS PATIENT ON DAY OF DISCHARGE: more than 30 minutes.   Leia Alf  M.D on 08/15/2017 at 2:03 PM  Between 7am to 6pm - Pager - [phone]  After 6pm go to [email or web address] - password [email or web address] [facility]  [facility]  Office  [phone]  CC: Primary care physician; Birdie Sons, MD  Note: This dictation was prepared with Dragon dictation along with smaller phrase technology. Any transcriptional errors that result from this process are unintentional.

## 2017-08-19 DIAGNOSIS — I129 Hypertensive chronic kidney disease with stage 1 through stage 4 chronic kidney disease, or unspecified chronic kidney disease: Secondary | ICD-10-CM | POA: Diagnosis not present

## 2017-08-19 DIAGNOSIS — I272 Pulmonary hypertension, unspecified: Secondary | ICD-10-CM | POA: Diagnosis not present

## 2017-08-19 DIAGNOSIS — E876 Hypokalemia: Secondary | ICD-10-CM | POA: Diagnosis not present

## 2017-08-19 DIAGNOSIS — N183 Chronic kidney disease, stage 3 (moderate): Secondary | ICD-10-CM | POA: Diagnosis not present

## 2017-08-30 DIAGNOSIS — R569 Unspecified convulsions: Secondary | ICD-10-CM | POA: Diagnosis not present

## 2017-09-08 ENCOUNTER — Other Ambulatory Visit: Payer: Self-pay | Admitting: Family Medicine

## 2017-09-08 DIAGNOSIS — M5136 Other intervertebral disc degeneration, lumbar region: Secondary | ICD-10-CM

## 2017-09-08 MED ORDER — OXYCODONE HCL 15 MG PO TABS: 15.0000 mg | ORAL_TABLET | Freq: Four times a day (QID) | ORAL | 0 refills | Status: DC | PRN

## 2017-09-08 NOTE — Telephone Encounter (Signed)
Patient is requesting a refill on the following medication  oxyCODONE (ROXICODONE) 15 MG immediate release tablet  He uses [facility].

## 2017-09-23 ENCOUNTER — Telehealth: Payer: Self-pay

## 2017-09-23 NOTE — Telephone Encounter (Signed)
LM for pt to CB and schedule his AWV after 10/14/17.

## 2017-09-28 NOTE — Telephone Encounter (Signed)
Pt returned call and is scheduled for AWV and F/U on 10/22/17. Thanks TNP

## 2017-09-29 ENCOUNTER — Telehealth: Payer: Self-pay | Admitting: Family Medicine

## 2017-09-29 ENCOUNTER — Ambulatory Visit (INDEPENDENT_AMBULATORY_CARE_PROVIDER_SITE_OTHER): Payer: Medicare Other | Admitting: Family Medicine

## 2017-09-29 ENCOUNTER — Ambulatory Visit
Admission: RE | Admit: 2017-09-29 | Discharge: 2017-09-29 | Disposition: A | Discharge status: Home / Self Care | Payer: Medicare Other | Source: Ambulatory Visit | Attending: Family Medicine | Admitting: Family Medicine

## 2017-09-29 ENCOUNTER — Encounter: Payer: Self-pay | Admitting: Family Medicine

## 2017-09-29 VITALS — BP 128/70 | HR 65 | Temp 98.1°F | Resp 18 | Wt 196.0 lb

## 2017-09-29 DIAGNOSIS — R0602 Shortness of breath: Secondary | ICD-10-CM | POA: Insufficient documentation

## 2017-09-29 DIAGNOSIS — R609 Edema, unspecified: Secondary | ICD-10-CM

## 2017-09-29 DIAGNOSIS — Z951 Presence of aortocoronary bypass graft: Secondary | ICD-10-CM | POA: Insufficient documentation

## 2017-09-29 DIAGNOSIS — R0600 Dyspnea, unspecified: Secondary | ICD-10-CM

## 2017-09-29 DIAGNOSIS — J449 Chronic obstructive pulmonary disease, unspecified: Secondary | ICD-10-CM | POA: Diagnosis not present

## 2017-09-29 NOTE — Telephone Encounter (Signed)
Patient has an appt today with Dr. Caryn Section at 10:20 am.

## 2017-09-29 NOTE — Progress Notes (Signed)
Patient: Vincent Mason Male    DOB: 1935/10/19   82 y.o.   MRN: [id number] Visit Date: 09/29/2017  Today's Provider: Lelon Huh, MD   Chief Complaint  Patient presents with  . Shortness of Breath    x 2 weeks   Subjective:    Shortness of Breath  This is a new problem. Episode onset: 2 weeks. The problem occurs constantly. The problem has been unchanged. Associated symptoms include leg swelling, a rash and rhinorrhea. Pertinent negatives include no abdominal pain, chest pain, fever, leg pain, vomiting or wheezing. Nothing aggravates the symptoms.  Patient states he sometimes has to take deep breaths to prevent feeling short of breath. He has also noted increase swelling in his legs the last several days. He would like to get back into lymphedema clinic.   Wt Readings from Last 3 Encounters:  09/29/17 196 lb (88.9 kg)  08/13/17 185 lb (83.9 kg)  08/06/17 189 lb (85.7 kg)       Allergies  Allergen Reactions  . Decongestant  [Oxymetazoline]      Current Outpatient Medications:  .  apixaban (ELIQUIS) 5 MG TABS tablet, Take 1 tablet (5 mg total) by mouth 2 (two) times daily. (Patient taking differently: Take 2.5 mg by mouth 2 (two) times daily. ), Disp: 60 tablet, Rfl: 0 .  aspirin EC 81 MG EC tablet, Take 1 tablet (81 mg total) by mouth daily., Disp: 30 tablet, Rfl: 0 .  digoxin (LANOXIN) 0.125 MG tablet, Take 0.125 mg by mouth daily., Disp: , Rfl:  .  ferrous sulfate 325 (65 FE) MG tablet, Take 1 tablet (325 mg total) by mouth 2 (two) times daily with a meal., Disp: 60 tablet, Rfl: 3 .  levETIRAcetam (KEPPRA) 500 MG tablet, Take 1 tablet (500 mg total) by mouth 2 (two) times daily., Disp: 60 tablet, Rfl: 0 .  methocarbamol (ROBAXIN) 750 MG tablet, Take 750 mg by mouth at bedtime as needed for muscle spasms., Disp: , Rfl:  .  metolazone (ZAROXOLYN) 5 MG tablet, Take 5 mg by mouth daily as needed (swelling). , Disp: , Rfl:  .  metoprolol succinate (TOPROL-XL) 25 MG 24 hr  tablet, Take 0.5 tablets (12.5 mg total) by mouth daily., Disp: 30 tablet, Rfl: 5 .  oxyCODONE (ROXICODONE) 15 MG immediate release tablet, Take 1 tablet (15 mg total) by mouth every 6 (six) hours as needed for pain., Disp: 120 tablet, Rfl: 0 .  polyethylene glycol (MIRALAX / GLYCOLAX) packet, Take 17 g by mouth daily., Disp: 14 each, Rfl: 0 .  ranitidine (ZANTAC) 150 MG tablet, Take 150 mg by mouth 2 (two) times daily., Disp: , Rfl:  .  spironolactone (ALDACTONE) 25 MG tablet, Take 25 mg daily by mouth., Disp: , Rfl:  .  torsemide (DEMADEX) 20 MG tablet, Take 20 mg daily by mouth., Disp: , Rfl:  .  ULORIC 80 MG TABS, TAKE ONE (1) TABLET EACH DAY - STOP TAKING ALLOPURINOL., Disp: 30 tablet, Rfl: 11 .  Vitamin D, Ergocalciferol, (DRISDOL) 50000 units CAPS capsule, Take 1 capsule (50,000 Units total) every 7 (seven) days by mouth., Disp: 12 capsule, Rfl: 1  Review of Systems  Constitutional: Negative for appetite change, chills, diaphoresis, fatigue and fever.  HENT: Positive for rhinorrhea.   Respiratory: Positive for shortness of breath. Negative for chest tightness and wheezing.   Cardiovascular: Positive for leg swelling. Negative for chest pain and palpitations.  Gastrointestinal: Negative for abdominal pain, nausea and vomiting.  Skin: Positive for rash.       Red rash on left forearm x 1 week    Social History   Tobacco Use  . Smoking status: Former Research scientist (life sciences)  . Smokeless tobacco: Never Used  . Tobacco comment: Quit in 1990  Substance Use Topics  . Alcohol use: No   Objective:   BP 128/70 (BP Location: Right Arm, Patient Position: Sitting, Cuff Size: Large)   Pulse 65   Temp 98.1 F (36.7 C) (Oral)   Resp 18   Wt 196 lb (88.9 kg)   SpO2 97% Comment: room air  BMI 26.58 kg/m     Physical Exam   General Appearance:    Alert, cooperative, no distress  Eyes:    PERRL, conjunctiva/corneas clear, EOM's intact       Lungs:     Clear to auscultation bilaterally, respirations  unlabored  Heart:     Irregularly irregular rhythm. Normal rate   Neurologic:   Awake, alert, oriented x 3. No apparent focal neurological           defect.   Ext:   2+ bipedal edema   Chest XR: emphysematous changes, no acute changes.     Assessment & Plan:     1. Dyspnea, unspecified type  - Comprehensive metabolic panel - CBC - Brain natriuretic peptide - DG Chest 2 View; Future  2. Edema, unspecified type Is to double up on torsemide. If not quickly improving then will get back in to lymphedema clinic        Lelon Huh, MD  [place]

## 2017-09-29 NOTE — Telephone Encounter (Signed)
Pt said his legs are swelling.  He  Michela Pitcher he was referred to someone a couple years ago and cant remember who it is.  Call back [phone]  Thanks Con Memos

## 2017-09-29 NOTE — Patient Instructions (Signed)
   Please go to the lab draw center in [address] on the second floor of [place] to the [facility] on [address] for chest Xray

## 2017-09-30 LAB — CBC
Hematocrit: 34.9 % — ABNORMAL LOW (ref 37.5–51.0)
Hemoglobin: 11.8 g/dL — ABNORMAL LOW (ref 13.0–17.7)
MCH: 32.4 pg (ref 26.6–33.0)
MCHC: 33.8 g/dL (ref 31.5–35.7)
MCV: 96 fL (ref 79–97)
Platelets: 200 10*3/uL (ref 150–379)
RBC: 3.64 x10E6/uL — ABNORMAL LOW (ref 4.14–5.80)
RDW: 15.4 % (ref 12.3–15.4)
WBC: 7.1 10*3/uL (ref 3.4–10.8)

## 2017-09-30 LAB — COMPREHENSIVE METABOLIC PANEL
ALT: 15 IU/L (ref 0–44)
AST: 20 IU/L (ref 0–40)
Albumin/Globulin Ratio: 1.7 (ref 1.2–2.2)
Albumin: 4.3 g/dL (ref 3.5–4.7)
Alkaline Phosphatase: 54 IU/L (ref 39–117)
BUN/Creatinine Ratio: 12 (ref 10–24)
BUN: 20 mg/dL (ref 8–27)
Bilirubin Total: 0.5 mg/dL (ref 0.0–1.2)
CO2: 24 mmol/L (ref 20–29)
Calcium: 9.4 mg/dL (ref 8.6–10.2)
Chloride: 104 mmol/L (ref 96–106)
Creatinine, Ser: 1.62 mg/dL — ABNORMAL HIGH (ref 0.76–1.27)
GFR calc Af Amer: 46 mL/min/{1.73_m2} — ABNORMAL LOW (ref >59)
GFR calc non Af Amer: 39 mL/min/{1.73_m2} — ABNORMAL LOW (ref >59)
Globulin, Total: 2.5 g/dL (ref 1.5–4.5)
Glucose: 101 mg/dL — ABNORMAL HIGH (ref 65–99)
Potassium: 4.5 mmol/L (ref 3.5–5.2)
Sodium: 143 mmol/L (ref 134–144)
Total Protein: 6.8 g/dL (ref 6.0–8.5)

## 2017-09-30 LAB — BRAIN NATRIURETIC PEPTIDE: BNP: 133.7 pg/mL — ABNORMAL HIGH (ref 0.0–100.0)

## 2017-09-30 NOTE — Telephone Encounter (Signed)
Noted, thank you.  -MM 

## 2017-10-01 ENCOUNTER — Telehealth: Payer: Self-pay | Admitting: Family Medicine

## 2017-10-01 ENCOUNTER — Telehealth: Payer: Self-pay | Admitting: *Deleted

## 2017-10-01 NOTE — Telephone Encounter (Signed)
Pt was in earlier this week and had labs.  Wanting to know if we have results back   His call back is [phone]  Thanks Con Memos

## 2017-10-01 NOTE — Telephone Encounter (Signed)
-----   Message from Birdie Sons, MD sent at 10/01/2017 11:17 AM EDT ----- Labs show slight elevated in BNP c/w mild chf which is causing swelling and shortness of breath.  Should resolve by doubling dose of torsemide over the weekend. Call if not much better over the weekend.

## 2017-10-01 NOTE — Telephone Encounter (Signed)
See result note.  

## 2017-10-01 NOTE — Telephone Encounter (Signed)
LMOVM for pt to return call 

## 2017-10-04 ENCOUNTER — Other Ambulatory Visit: Payer: Self-pay | Admitting: Family Medicine

## 2017-10-04 DIAGNOSIS — M5136 Other intervertebral disc degeneration, lumbar region: Secondary | ICD-10-CM

## 2017-10-04 MED ORDER — OXYCODONE HCL 15 MG PO TABS: 15.0000 mg | ORAL_TABLET | Freq: Four times a day (QID) | ORAL | 0 refills | Status: DC | PRN

## 2017-10-04 NOTE — Telephone Encounter (Addendum)
Patient returned call. Patient stopped taking torsemide bid on Friday 10/01/17. His swelling and sob had subsided. Patient stated he started having slight swelling again over the weekend, no sob. Per Dr. Caryn Section patient was advised to take 2nd torsemide 20 mg prn. Patient expressed understanding.

## 2017-10-21 NOTE — Progress Notes (Signed)
Patient: Vincent Mason Male    DOB: [birth year]   82 y.o.   MRN: [id number] Visit Date: 10/22/2017  Today's Provider: Lelon Huh, MD   Chief Complaint  Patient presents with  . Follow-up  . Chronic Kidney Disease  . Atrial Fibrillation  . Edema   Subjective:   Patient saw [facility] for AWV today at 1:20 pm.  HPI  Edema, unspecified type From 09/29/2017-patient was advised to double up on torsemide. If not quickly improving then will get back in to lymphedema clinic. Labs ordered, showing elevated BNP. Since then he reports swelling has significantly improved. Is tolerating increased dose of torsemide without adverse effect.   Wt Readings from Last 3 Encounters:  10/22/17 185 lb 12.8 oz (84.3 kg)  09/29/17 196 lb (88.9 kg)  08/13/17 185 lb (83.9 kg)     Seizures ([place]) From 08/13/2017-referred to Neurology. Patient was strongly encouraged to stay on anti-seizure medications. Was last seen by Dr. Manuella Ghazi in march.   Displacement of cervical intervertebral disc without myelopathy From 07/09/2017-changed to hydrocodone/apap 10/325 QID due to insurance.  He is doing well with current dose of hydrocodone/apap    Chronic atrial fibrillation ([place]) From 07/09/2017-no changes. Rate controlled on appropriate anticoaedgulation.    Chronic kidney disease (CKD), stage III (moderate) (HCC) From 04/08/2017-no changes. Continue regular follow up Dr. Candiss Norse.      Allergies  Allergen Reactions  . Decongestant  [Oxymetazoline]      Current Outpatient Medications:  .  apixaban (ELIQUIS) 5 MG TABS tablet, Take 1 tablet (5 mg total) by mouth 2 (two) times daily. (Patient taking differently: Take 2.5 mg by mouth 2 (two) times daily. ), Disp: 60 tablet, Rfl: 0 .  aspirin EC 81 MG EC tablet, Take 1 tablet (81 mg total) by mouth daily., Disp: 30 tablet, Rfl: 0 .  Cholecalciferol (VITAMIN D3) 1000 units CAPS, Take by mouth daily., Disp: , Rfl:  .  digoxin (LANOXIN) 0.125 MG tablet, Take  0.125 mg by mouth daily., Disp: , Rfl:  .  ferrous sulfate 325 (65 FE) MG tablet, Take 1 tablet (325 mg total) by mouth 2 (two) times daily with a meal., Disp: 60 tablet, Rfl: 3 .  levETIRAcetam (KEPPRA) 500 MG tablet, Take 1 tablet (500 mg total) by mouth 2 (two) times daily., Disp: 60 tablet, Rfl: 0 .  methocarbamol (ROBAXIN) 750 MG tablet, Take 750 mg by mouth at bedtime as needed for muscle spasms., Disp: , Rfl:  .  metolazone (ZAROXOLYN) 5 MG tablet, Take 5 mg by mouth daily as needed (swelling). , Disp: , Rfl:  .  metoprolol succinate (TOPROL-XL) 25 MG 24 hr tablet, Take 0.5 tablets (12.5 mg total) by mouth daily., Disp: 30 tablet, Rfl: 5 .  oxyCODONE (ROXICODONE) 15 MG immediate release tablet, Take 1 tablet (15 mg total) by mouth every 6 (six) hours as needed for pain., Disp: 120 tablet, Rfl: 0 .  polyethylene glycol (MIRALAX / GLYCOLAX) packet, Take 17 g by mouth daily., Disp: 14 each, Rfl: 0 .  ranitidine (ZANTAC) 150 MG tablet, Take 150 mg by mouth 2 (two) times daily., Disp: , Rfl:  .  spironolactone (ALDACTONE) 25 MG tablet, Take 25 mg daily by mouth., Disp: , Rfl:  .  torsemide (DEMADEX) 20 MG tablet, Take 20 mg daily by mouth., Disp: , Rfl:  .  ULORIC 80 MG TABS, TAKE ONE (1) TABLET EACH DAY - STOP TAKING ALLOPURINOL., Disp: 30 tablet, Rfl: 11 .  Vitamin D, Ergocalciferol, (DRISDOL) 50000 units CAPS capsule, Take 1 capsule (50,000 Units total) every 7 (seven) days by mouth., Disp: 12 capsule, Rfl: 1  Review of Systems  Constitutional: Positive for appetite change and fatigue.  HENT: Positive for hearing loss.   Eyes: Negative.   Respiratory: Positive for shortness of breath.   Cardiovascular: Negative.   Gastrointestinal: Negative.   Endocrine: Positive for cold intolerance and polyuria.  Genitourinary: Negative.   Musculoskeletal: Negative.   Skin: Negative.   Allergic/Immunologic: Negative.   Neurological: Negative.   Hematological: Negative.   Psychiatric/Behavioral:  Negative.     Social History   Tobacco Use  . Smoking status: Former Research scientist (life sciences)  . Smokeless tobacco: Never Used  . Tobacco comment: Quit in 1990  Substance Use Topics  . Alcohol use: No   Objective:   Vitals:   BP 126/60 (BP Location: Right Arm)   Pulse 71   Temp 97.7 F (36.5 C) (Oral)   Ht 5\' 10"  (1.778 m)   Wt 185 lb 12.8 oz (84.3 kg)   BMI 26.66 kg/m   BSA 2.04 m     Physical Exam   General Appearance:    Alert, cooperative, no distress  Eyes:    PERRL, conjunctiva/corneas clear, EOM's intact       Lungs:     Clear to auscultation bilaterally, respirations unlabored  Heart:     Irregularly irregular rhythm. Normal rate   Neurologic:   Awake, alert, oriented x 3. No apparent focal neurological           defect.           Assessment & Plan:     1. Edema, unspecified type Much better since doubling dose of torsemide which he is tolerating well. Continue current medications.    2. Chronic atrial fibrillation (HCC) Rate controlled, doing well with NOAC. Continue routine follow up cardiology.   3. Chronic kidney disease (CKD), stage III (moderate) (HCC) Stable.   4. Degenerative disc disease, lumbar Continue current pain medications.   Return in about 3 months (around 01/31/2018).        Lelon Huh, MD  [facility]

## 2017-10-22 ENCOUNTER — Ambulatory Visit (INDEPENDENT_AMBULATORY_CARE_PROVIDER_SITE_OTHER): Payer: Medicare Other

## 2017-10-22 ENCOUNTER — Encounter: Payer: Self-pay | Admitting: Family Medicine

## 2017-10-22 ENCOUNTER — Ambulatory Visit (INDEPENDENT_AMBULATORY_CARE_PROVIDER_SITE_OTHER): Payer: Medicare Other | Admitting: Family Medicine

## 2017-10-22 VITALS — BP 126/60 | HR 71 | Temp 97.7°F | Ht 70.0 in | Wt 185.8 lb

## 2017-10-22 DIAGNOSIS — N183 Chronic kidney disease, stage 3 unspecified: Secondary | ICD-10-CM

## 2017-10-22 DIAGNOSIS — I482 Chronic atrial fibrillation, unspecified: Secondary | ICD-10-CM

## 2017-10-22 DIAGNOSIS — M5136 Other intervertebral disc degeneration, lumbar region: Secondary | ICD-10-CM

## 2017-10-22 DIAGNOSIS — R609 Edema, unspecified: Secondary | ICD-10-CM | POA: Diagnosis not present

## 2017-10-22 DIAGNOSIS — Z0000 Encounter for general adult medical examination without abnormal findings: Secondary | ICD-10-CM

## 2017-10-22 NOTE — Patient Instructions (Signed)
   The CDC recommends two doses of Shingrix (the shingles vaccine) separated by 2 to 6 months for adults age 80 years and older. I recommend checking with your pharmacy plan regarding coverage for this vaccine.     

## 2017-10-22 NOTE — Progress Notes (Signed)
Subjective:   Vincent Mason is a 82 y.o. male who presents for Medicare Annual/Subsequent preventive examination.  Review of Systems:  N/A  Cardiac Risk Factors include: advanced age (>69mn, >>34women);dyslipidemia;hypertension;male gender     Objective:    Vitals: BP 126/60 (BP Location: Right Arm)   Pulse 71   Temp 97.7 F (36.5 C) (Oral)   Ht 5' 10"  (1.778 m)   Wt 185 lb 12.8 oz (84.3 kg)   BMI 26.66 kg/m   Body mass index is 26.66 kg/m.  Advanced Directives 10/22/2017 08/06/2017 01/21/2017 12/31/2016 12/02/2016 11/10/2016 10/14/2016  Does Patient Have a Medical Advance Directive? No No No No No No No  Would patient like information on creating a medical advance directive? No - Patient declined Yes (Inpatient - patient requests chaplain consult to create a medical advance directive) No - Patient declined No - Patient declined Yes (Inpatient - patient requests chaplain consult to create a medical advance directive) - Yes (ED - Information included in AVS)    Tobacco Social History   Tobacco Use  Smoking Status Former Smoker  Smokeless Tobacco Never Used  Tobacco Comment   [place]: Not Answered Comment: Quit in 1990   Clinical Intake:  Pre-visit preparation completed: Yes  Pain : No/denies pain Pain Score: 0-No pain     Nutritional Status: BMI 25 -29 Overweight Nutritional Risks: None Diabetes: No  How often do you need to have someone help you when you read instructions, pamphlets, or other written materials from your doctor or pharmacy?: 1 - Never  Interpreter Needed?: No  Information entered by :: [facility]  Past Medical History:  Diagnosis Date  . Acute encephalopathy 09/03/2015  . [facility]rial [facility] flutter [facility] [place]   . C. difficile colitis 09/12/2015  . [place]   . HCAP (healthcare-associated pneumonia) 09/12/2015  . Hyperlipidemia   .  Hypertension   . Kidney disease, chronic, stage III (moderate, EGFR 30-59 ml/min) ([facility]    ARF 2013  . OSA (obstructive sleep apnea)    non-compliant with CPAP.   .Marland KitchenSevere sepsis with septic shock ([facility] 09/12/2015   Past Surgical History:  Procedure Laterality Date  . CATARACT EXTRACTION    . CORONARY ARTERY BYPASS GRAFT  10/1997  . history of cervical discectomy  2009   C5-C6  . Hyperplastic colon polyp  02/2002   Sigmoid polyps   Family History  Problem Relation Age of Onset  . Hypertension Mother   . Hypertension Father    Social History   Socioeconomic History  . Marital status: Married    Spouse name: Not on file  . Number of children: 3  . Years of education: some colle  . Highest education level: Some college, no degree  Occupational History  . Occupation: Retired  SScientific laboratory technician . Financial resource strain: Not hard at all  . Food insecurity:    Worry: Never true    Inability: Never true  . Transportation needs:    Medical: No    Non-medical: No  Tobacco Use  . Smoking status: Former SResearch scientist (life sciences) . Smokeless tobacco: Never Used  . Tobacco comment: Quit in 1990  Substance and Sexual Activity  . Alcohol use: No  . Drug use: No  . Sexual activity: Not on file  Lifestyle  . Physical activity:    Days per week: Not on  file    Minutes per session: Not on file  . Stress: Only a little  Relationships  . Social connections:    Talks on phone: Not on file    Gets together: Not on file    Attends religious service: Not on file    Active member of club or organization: Not on file    Attends meetings of clubs or organizations: Not on file    Relationship status: Not on file  Other Topics Concern  . Not on file  Social History Narrative  . Not on file    Outpatient Encounter Medications as of 10/22/2017  Medication Sig  . apixaban (ELIQUIS) 5 MG TABS tablet Take 1 tablet (5 mg total) by mouth 2 (two) times daily. (Patient taking differently: Take 2.5 mg by mouth  2 (two) times daily. )  . aspirin EC 81 MG EC tablet Take 1 tablet (81 mg total) by mouth daily.  . Cholecalciferol (VITAMIN D3) 1000 units CAPS Take by mouth daily.  . digoxin (LANOXIN) 0.125 MG tablet Take 0.125 mg by mouth daily.  . ferrous sulfate 325 (65 FE) MG tablet Take 1 tablet (325 mg total) by mouth 2 (two) times daily with a meal.  . levETIRAcetam (KEPPRA) 500 MG tablet Take 1 tablet (500 mg total) by mouth 2 (two) times daily.  . metoprolol succinate (TOPROL-XL) 25 MG 24 hr tablet Take 0.5 tablets (12.5 mg total) by mouth daily.  Marland Kitchen oxyCODONE (ROXICODONE) 15 MG immediate release tablet Take 1 tablet (15 mg total) by mouth every 6 (six) hours as needed for pain.  . polyethylene glycol (MIRALAX / GLYCOLAX) packet Take 17 g by mouth daily.  . ranitidine (ZANTAC) 150 MG tablet Take 150 mg by mouth 2 (two) times daily.  Marland Kitchen spironolactone (ALDACTONE) 25 MG tablet Take 25 mg daily by mouth.  . torsemide (DEMADEX) 20 MG tablet Take 20 mg daily by mouth.  Marland Kitchen ULORIC 80 MG TABS TAKE ONE (1) TABLET EACH DAY - STOP TAKING ALLOPURINOL.  Marland Kitchen methocarbamol (ROBAXIN) 750 MG tablet Take 750 mg by mouth at bedtime as needed for muscle spasms.  . metolazone (ZAROXOLYN) 5 MG tablet Take 5 mg by mouth daily as needed (swelling).   . Vitamin D, Ergocalciferol, (DRISDOL) 50000 units CAPS capsule Take 1 capsule (50,000 Units total) every 7 (seven) days by mouth. (Patient not taking: Reported on 10/22/2017)   No facility-administered encounter medications on file as of 10/22/2017.     Activities of Daily Living In your present state of health, do you have any difficulty performing the following activities: 10/22/2017 08/06/2017  Hearing? Y N  Comment Wears bilater hearing aids - completely deaf in right ear. -  Vision? Y N  Comment Has trouble seeing up close- needs readers.  -  Difficulty concentrating or making decisions? Y N  Walking or climbing stairs? N N  Dressing or bathing? N N  Doing errands,  shopping? N N  Preparing Food and eating ? N -  Using the Toilet? N -  In the past six months, have you accidently leaked urine? N -  Do you have problems with loss of bowel control? N -  Managing your Medications? N -  Managing your Finances? N -  Housekeeping or managing your Housekeeping? N -  Some recent data might be hidden    Patient Care Team: Birdie Sons, MD as PCP - General (Family Medicine) Corey Skains, MD as Consulting Physician (Internal Medicine) Estill Cotta, MD as  Consulting Physician (Ophthalmology) Clyde Canterbury, MD as Referring Physician (Otolaryngology) Murlean Iba, MD as Consulting Physician (Internal Medicine) Anabel Bene, MD as Referring Physician (Neurology)   Assessment:   This is a routine wellness examination for Maximillian.  Exercise Activities and Dietary recommendations Current Exercise Habits: The patient does not participate in regular exercise at present, Exercise limited by: None identified  Goals    . DIET - INCREASE WATER INTAKE     Recommend increasing water intake to 6 glasses a day.        Fall Risk Fall Risk  10/22/2017 12/31/2016 10/14/2016  Falls in the past year? No Yes Yes  Number falls in past yr: - 1 1  Comment - yesterday  -  Injury with Fall? - No No  Follow up - - Falls prevention discussed   Is the patient's home free of loose throw rugs in walkways, pet beds, electrical cords, etc?   yes      Grab bars in the bathroom? no      Handrails on the stairs?   yes      Adequate lighting?   yes  Timed Get Up and Go Performed: N/A  Depression Screen PHQ 2/9 Scores 10/22/2017 12/31/2016 10/14/2016 10/14/2016  PHQ - 2 Score 0 0 0 0  PHQ- 9 Score - - 1 -    Cognitive Function     6CIT Screen 10/22/2017 10/14/2016  What Year? 0 points 0 points  What month? 0 points 0 points  What time? 0 points 0 points  Count back from 20 0 points 0 points  Months in reverse 2 points 2 points  Repeat phrase 0 points 6 points    Total Score 2 8    Immunization History  Administered Date(s) Administered  . Influenza, High Dose Seasonal PF 06/17/2016, 04/08/2017  . PPD Test 12/03/2016  . Pneumococcal Conjugate-13 02/06/2014  . Pneumococcal Polysaccharide-23 03/19/2003  . Zoster 02/06/2014    Qualifies for Shingles Vaccine? Due for Shingles vaccine. Declined my offer to administer today. Education has been provided regarding the importance of this vaccine. Pt has been advised to call her insurance company to determine her out of pocket expense. Advised she may also receive this vaccine at her local pharmacy or Health Dept. Verbalized acceptance and understanding.  Screening Tests Health Maintenance  Topic Date Due  . TETANUS/TDAP  05/25/2026 (Originally 06/23/1954)  . INFLUENZA VACCINE  12/23/2017  . PNA vac Low Risk Adult  Completed   Cancer Screenings: Lung: Low Dose CT Chest recommended if Age 49-80 years, 30 pack-year currently smoking OR have quit w/in 15years. Patient does not qualify. Colorectal: Up to date  Additional Screenings:  Hepatitis C Screening: N/A      Plan:  I have personally reviewed and addressed the Medicare Annual Wellness questionnaire and have noted the following in the patient's chart:  A. Medical and social history B. Use of alcohol, tobacco or illicit drugs  C. Current medications and supplements D. Functional ability and status E.  Nutritional status F.  Physical activity G. Advance directives H. List of other physicians I.  Hospitalizations, surgeries, and ER visits in previous 12 months J.  [place] such as hearing and vision if needed, cognitive and depression L. Referrals and appointments - none  In addition, I have reviewed and discussed with patient certain preventive protocols, quality metrics, and best practice recommendations. A written personalized care plan for preventive services as well as general preventive health recommendations were provided  to patient.  See attached scanned questionnaire for additional information.   Signed,  Fabio Neighbors, LPN Nurse Health Advisor   Nurse Recommendations: Pt declined the tetanus vaccine today.

## 2017-10-22 NOTE — Patient Instructions (Addendum)
Vincent Mason , Thank you for taking time to come for your Medicare Wellness Visit. I appreciate your ongoing commitment to your health goals. Please review the following plan we discussed and let me know if I can assist you in the future.   Screening recommendations/referrals: Colonoscopy: Up to date Recommended yearly ophthalmology/optometry visit for glaucoma screening and checkup Recommended yearly dental visit for hygiene and checkup  Vaccinations: Influenza vaccine: Up to date Pneumococcal vaccine: Up to date Tdap vaccine: Pt declines today.  Shingles vaccine: Pt declines today.     Advanced directives: Please bring a copy of your POA (Power of Attorney) and/or Living Will to your next appointment once completed.   Conditions/risks identified: Recommend increasing water intake to 6 glasses a day.   Next appointment: 2:00 PM today with Dr Caryn Section.   Preventive Care 80 Years and Older, Male Preventive care refers to lifestyle choices and visits with your health care provider that can promote health and wellness. What does preventive care include?  A yearly physical exam. This is also called an annual well check.  Dental exams once or twice a year.  Routine eye exams. Ask your health care provider how often you should have your eyes checked.  Personal lifestyle choices, including:  Daily care of your teeth and gums.  Regular physical activity.  Eating a healthy diet.  Avoiding tobacco and drug use.  Limiting alcohol use.  Practicing safe sex.  Taking low doses of aspirin every day.  Taking vitamin and mineral supplements as recommended by your health care provider. What happens during an annual well check? The services and screenings done by your health care provider during your annual well check will depend on your age, overall health, lifestyle risk factors, and family history of disease. Counseling  Your health care provider may ask you questions about  your:  Alcohol use.  Tobacco use.  Drug use.  Emotional well-being.  Home and relationship well-being.  Sexual activity.  Eating habits.  History of falls.  Memory and ability to understand (cognition).  Work and work Statistician. Screening  You may have the following tests or measurements:  Height, weight, and BMI.  Blood pressure.  Lipid and cholesterol levels. These may be checked every 5 years, or more frequently if you are over 10 years old.  Skin check.  Lung cancer screening. You may have this screening every year starting at age 65 if you have a 30-pack-year history of smoking and currently smoke or have quit within the past 15 years.  Fecal occult blood test (FOBT) of the stool. You may have this test every year starting at age 80.  Flexible sigmoidoscopy or colonoscopy. You may have a sigmoidoscopy every 5 years or a colonoscopy every 10 years starting at age 66.  Prostate cancer screening. Recommendations will vary depending on your family history and other risks.  Hepatitis C blood test.  Hepatitis B blood test.  Sexually transmitted disease (STD) testing.  Diabetes screening. This is done by checking your blood sugar (glucose) after you have not eaten for a while (fasting). You may have this done every 1-3 years.  Abdominal aortic aneurysm (AAA) screening. You may need this if you are a current or former smoker.  Osteoporosis. You may be screened starting at age 66 if you are at high risk. Talk with your health care provider about your test results, treatment options, and if necessary, the need for more tests. Vaccines  Your health care provider may recommend certain vaccines,  such as:  Influenza vaccine. This is recommended every year.  Tetanus, diphtheria, and acellular pertussis (Tdap, Td) vaccine. You may need a Td booster every 10 years.  Zoster vaccine. You may need this after age 63.  Pneumococcal 13-valent conjugate (PCV13) vaccine.  One dose is recommended after age 25.  Pneumococcal polysaccharide (PPSV23) vaccine. One dose is recommended after age 49. Talk to your health care provider about which screenings and vaccines you need and how often you need them. This information is not intended to replace advice given to you by your health care provider. Make sure you discuss any questions you have with your health care provider. Document Released: 06/07/2015 Document Revised: 01/29/2016 Document Reviewed: 03/12/2015 Elsevier Interactive Patient Education  2017 [place] Prevention in the Home Falls can cause injuries. They can happen to people of all ages. There are many things you can do to make your home safe and to help prevent falls. What can I do on the outside of my home?  Regularly fix the edges of walkways and driveways and fix any cracks.  Remove anything that might make you trip as you walk through a door, such as a raised step or threshold.  Trim any bushes or trees on the path to your home.  Use bright outdoor lighting.  Clear any walking paths of anything that might make someone trip, such as rocks or tools.  Regularly check to see if handrails are loose or broken. Make sure that both sides of any steps have handrails.  Any raised decks and porches should have guardrails on the edges.  Have any leaves, snow, or ice cleared regularly.  Use sand or salt on walking paths during winter.  Clean up any spills in your garage right away. This includes oil or grease spills. What can I do in the bathroom?  Use night lights.  Install grab bars by the toilet and in the tub and shower. Do not use towel bars as grab bars.  Use non-skid mats or decals in the tub or shower.  If you need to sit down in the shower, use a plastic, non-slip stool.  Keep the floor dry. Clean up any water that spills on the floor as soon as it happens.  Remove soap buildup in the tub or shower regularly.  Attach bath  mats securely with double-sided non-slip rug tape.  Do not have throw rugs and other things on the floor that can make you trip. What can I do in the bedroom?  Use night lights.  Make sure that you have a light by your bed that is easy to reach.  Do not use any sheets or blankets that are too big for your bed. They should not hang down onto the floor.  Have a firm chair that has side arms. You can use this for support while you get dressed.  Do not have throw rugs and other things on the floor that can make you trip. What can I do in the kitchen?  Clean up any spills right away.  Avoid walking on wet floors.  Keep items that you use a lot in easy-to-reach places.  If you need to reach something above you, use a strong step stool that has a grab bar.  Keep electrical cords out of the way.  Do not use floor polish or wax that makes floors slippery. If you must use wax, use non-skid floor wax.  Do not have throw rugs and other things on  the floor that can make you trip. What can I do with my stairs?  Do not leave any items on the stairs.  Make sure that there are handrails on both sides of the stairs and use them. Fix handrails that are broken or loose. Make sure that handrails are as long as the stairways.  Check any carpeting to make sure that it is firmly attached to the stairs. Fix any carpet that is loose or worn.  Avoid having throw rugs at the top or bottom of the stairs. If you do have throw rugs, attach them to the floor with carpet tape.  Make sure that you have a light switch at the top of the stairs and the bottom of the stairs. If you do not have them, ask someone to add them for you. What else can I do to help prevent falls?  Wear shoes that:  Do not have high heels.  Have rubber bottoms.  Are comfortable and fit you well.  Are closed at the toe. Do not wear sandals.  If you use a stepladder:  Make sure that it is fully opened. Do not climb a closed  stepladder.  Make sure that both sides of the stepladder are locked into place.  Ask someone to hold it for you, if possible.  Clearly mark and make sure that you can see:  Any grab bars or handrails.  First and last steps.  Where the edge of each step is.  Use tools that help you move around (mobility aids) if they are needed. These include:  Canes.  Walkers.  Scooters.  Crutches.  Turn on the lights when you go into a dark area. Replace any light bulbs as soon as they burn out.  Set up your furniture so you have a clear path. Avoid moving your furniture around.  If any of your floors are uneven, fix them.  If there are any pets around you, be aware of where they are.  Review your medicines with your doctor. Some medicines can make you feel dizzy. This can increase your chance of falling. Ask your doctor what other things that you can do to help prevent falls. This information is not intended to replace advice given to you by your health care provider. Make sure you discuss any questions you have with your health care provider. Document Released: 03/07/2009 Document Revised: 10/17/2015 Document Reviewed: 06/15/2014 Elsevier Interactive Patient Education  2017 [employer].

## 2017-11-04 DIAGNOSIS — H90A21 Sensorineural hearing loss, unilateral, right ear, with restricted hearing on the contralateral side: Secondary | ICD-10-CM | POA: Diagnosis not present

## 2017-11-08 ENCOUNTER — Other Ambulatory Visit: Payer: Self-pay

## 2017-11-08 DIAGNOSIS — M5136 Other intervertebral disc degeneration, lumbar region: Secondary | ICD-10-CM

## 2017-11-08 NOTE — Telephone Encounter (Signed)
Vincent Mason with [name] stated he had sent a refill request for oxyCODONE (ROXICODONE) 15 MG immediate release tablet before the weekend and was calling to check the status because pt is completely out of the medication. [name] is requesting the Rx be sent asap. Please advise. Thanks TNP

## 2017-11-09 MED ORDER — OXYCODONE HCL 15 MG PO TABS: 15.0000 mg | ORAL_TABLET | Freq: Four times a day (QID) | ORAL | 0 refills | Status: DC | PRN

## 2017-11-09 NOTE — Telephone Encounter (Signed)
Pt called to check status of Rx. Pt is upset and is out of the medication. Please advised. Thanks TNP

## 2017-11-29 ENCOUNTER — Telehealth: Payer: Self-pay | Admitting: Family Medicine

## 2017-11-29 DIAGNOSIS — M5136 Other intervertebral disc degeneration, lumbar region: Secondary | ICD-10-CM

## 2017-11-29 NOTE — Telephone Encounter (Signed)
Pt needs refill on his oxycodone 15 mg  He uses [facility]

## 2017-11-29 NOTE — Telephone Encounter (Signed)
Is not due for refill until 12-09-17

## 2017-12-02 NOTE — Telephone Encounter (Signed)
Patient advised and will call 1-2 days ahead for refill.

## 2017-12-02 NOTE — Telephone Encounter (Signed)
Pt called back saying he wants the pharmacy to have the prescription ready on the 18th because he was late getting his medication last month...      Thanks [employer]

## 2017-12-06 ENCOUNTER — Other Ambulatory Visit: Payer: Self-pay | Admitting: Family Medicine

## 2017-12-06 DIAGNOSIS — M5136 Other intervertebral disc degeneration, lumbar region: Secondary | ICD-10-CM

## 2017-12-06 NOTE — Telephone Encounter (Signed)
Pt requesting refill of Oxycodone 15 MG.  States he will be out on 7/18 and would like for you to go ahead a fill, states if you take three days and the pharmacy takes three days he will be out for 3 days.  He is very persistent the RX be called in today or tomorrow at the latest.    [name]

## 2017-12-07 MED ORDER — OXYCODONE HCL 15 MG PO TABS: 15.0000 mg | ORAL_TABLET | Freq: Four times a day (QID) | ORAL | 0 refills | Status: DC | PRN

## 2017-12-17 DIAGNOSIS — I272 Pulmonary hypertension, unspecified: Secondary | ICD-10-CM | POA: Diagnosis not present

## 2017-12-17 DIAGNOSIS — N183 Chronic kidney disease, stage 3 (moderate): Secondary | ICD-10-CM | POA: Diagnosis not present

## 2017-12-17 DIAGNOSIS — E876 Hypokalemia: Secondary | ICD-10-CM | POA: Diagnosis not present

## 2017-12-17 DIAGNOSIS — I129 Hypertensive chronic kidney disease with stage 1 through stage 4 chronic kidney disease, or unspecified chronic kidney disease: Secondary | ICD-10-CM | POA: Diagnosis not present

## 2017-12-17 DIAGNOSIS — R601 Generalized edema: Secondary | ICD-10-CM | POA: Diagnosis not present

## 2017-12-21 DIAGNOSIS — N183 Chronic kidney disease, stage 3 (moderate): Secondary | ICD-10-CM | POA: Diagnosis not present

## 2017-12-21 DIAGNOSIS — I272 Pulmonary hypertension, unspecified: Secondary | ICD-10-CM | POA: Diagnosis not present

## 2017-12-21 DIAGNOSIS — I129 Hypertensive chronic kidney disease with stage 1 through stage 4 chronic kidney disease, or unspecified chronic kidney disease: Secondary | ICD-10-CM | POA: Diagnosis not present

## 2017-12-21 DIAGNOSIS — R600 Localized edema: Secondary | ICD-10-CM | POA: Diagnosis not present

## 2018-01-06 ENCOUNTER — Ambulatory Visit: Payer: Self-pay | Admitting: Family Medicine

## 2018-01-06 ENCOUNTER — Other Ambulatory Visit: Payer: Self-pay | Admitting: Family Medicine

## 2018-01-06 DIAGNOSIS — R600 Localized edema: Secondary | ICD-10-CM | POA: Diagnosis not present

## 2018-01-06 DIAGNOSIS — I482 Chronic atrial fibrillation: Secondary | ICD-10-CM | POA: Diagnosis not present

## 2018-01-06 DIAGNOSIS — I10 Essential (primary) hypertension: Secondary | ICD-10-CM | POA: Diagnosis not present

## 2018-01-06 DIAGNOSIS — M51369 Other intervertebral disc degeneration, lumbar region without mention of lumbar back pain or lower extremity pain: Secondary | ICD-10-CM

## 2018-01-06 DIAGNOSIS — E782 Mixed hyperlipidemia: Secondary | ICD-10-CM | POA: Diagnosis not present

## 2018-01-06 DIAGNOSIS — N183 Chronic kidney disease, stage 3 (moderate): Secondary | ICD-10-CM | POA: Diagnosis not present

## 2018-01-06 DIAGNOSIS — I071 Rheumatic tricuspid insufficiency: Secondary | ICD-10-CM | POA: Diagnosis not present

## 2018-01-06 DIAGNOSIS — I25810 Atherosclerosis of coronary artery bypass graft(s) without angina pectoris: Secondary | ICD-10-CM | POA: Diagnosis not present

## 2018-01-06 DIAGNOSIS — M5136 Other intervertebral disc degeneration, lumbar region: Secondary | ICD-10-CM

## 2018-01-06 NOTE — Telephone Encounter (Signed)
Pt needs a refill on his oxycodone  15mg   He uses [facility]

## 2018-01-07 MED ORDER — OXYCODONE HCL 15 MG PO TABS: 15.0000 mg | ORAL_TABLET | Freq: Four times a day (QID) | ORAL | 0 refills | Status: DC | PRN

## 2018-02-01 ENCOUNTER — Telehealth: Payer: Self-pay | Admitting: Family Medicine

## 2018-02-01 DIAGNOSIS — M5136 Other intervertebral disc degeneration, lumbar region: Secondary | ICD-10-CM

## 2018-02-01 NOTE — Telephone Encounter (Signed)
Please advise patient refill is due on 02/06/2018

## 2018-02-01 NOTE — Telephone Encounter (Signed)
Pt contacted office for refill request on the following medications:  oxyCODONE (ROXICODONE) 15 MG immediate release tablet  [name]  Last Rx: 01/07/18 LOV: 10/22/17 NOV: 02/21/18 Pt stated he needs the Rx sent in by Friday 02/04/18 because he will run out of the medication on Saturday and his pharmacy is closed on the weekend. Please advise. Thanks TNP

## 2018-02-04 NOTE — Telephone Encounter (Signed)
Pt will be out of the medication tomorrow.  He uses [name] and they are not open for prescriptions on Sunday.  Can you please send it in today  Please call patient when rx is sent in  CB#  [phone]  Thanks teri.

## 2018-02-07 MED ORDER — OXYCODONE HCL 15 MG PO TABS: 15.0000 mg | ORAL_TABLET | Freq: Four times a day (QID) | ORAL | 0 refills | Status: DC | PRN

## 2018-02-21 ENCOUNTER — Ambulatory Visit (INDEPENDENT_AMBULATORY_CARE_PROVIDER_SITE_OTHER): Payer: Medicare Other | Admitting: Family Medicine

## 2018-02-21 ENCOUNTER — Encounter: Payer: Self-pay | Admitting: Family Medicine

## 2018-02-21 VITALS — BP 103/67 | HR 51 | Temp 98.3°F | Resp 16 | Wt 189.0 lb

## 2018-02-21 DIAGNOSIS — M5136 Other intervertebral disc degeneration, lumbar region: Secondary | ICD-10-CM | POA: Diagnosis not present

## 2018-02-21 DIAGNOSIS — Z23 Encounter for immunization: Secondary | ICD-10-CM | POA: Diagnosis not present

## 2018-02-21 DIAGNOSIS — I272 Pulmonary hypertension, unspecified: Secondary | ICD-10-CM

## 2018-02-21 DIAGNOSIS — I482 Chronic atrial fibrillation, unspecified: Secondary | ICD-10-CM

## 2018-02-21 DIAGNOSIS — I10 Essential (primary) hypertension: Secondary | ICD-10-CM | POA: Diagnosis not present

## 2018-02-21 DIAGNOSIS — K745 Biliary cirrhosis, unspecified: Secondary | ICD-10-CM | POA: Insufficient documentation

## 2018-02-21 DIAGNOSIS — F1120 Opioid dependence, uncomplicated: Secondary | ICD-10-CM

## 2018-02-21 IMAGING — DX DG CHEST 1V
1 series · 1 of 1 positions shown · non-contrast
Comparison: 12/02/2016.

CLINICAL DATA: Altered mental status. Possible oxycodone overdose.
Ex-smoker.

EXAM:
CHEST 1 VIEW

[chest ap]
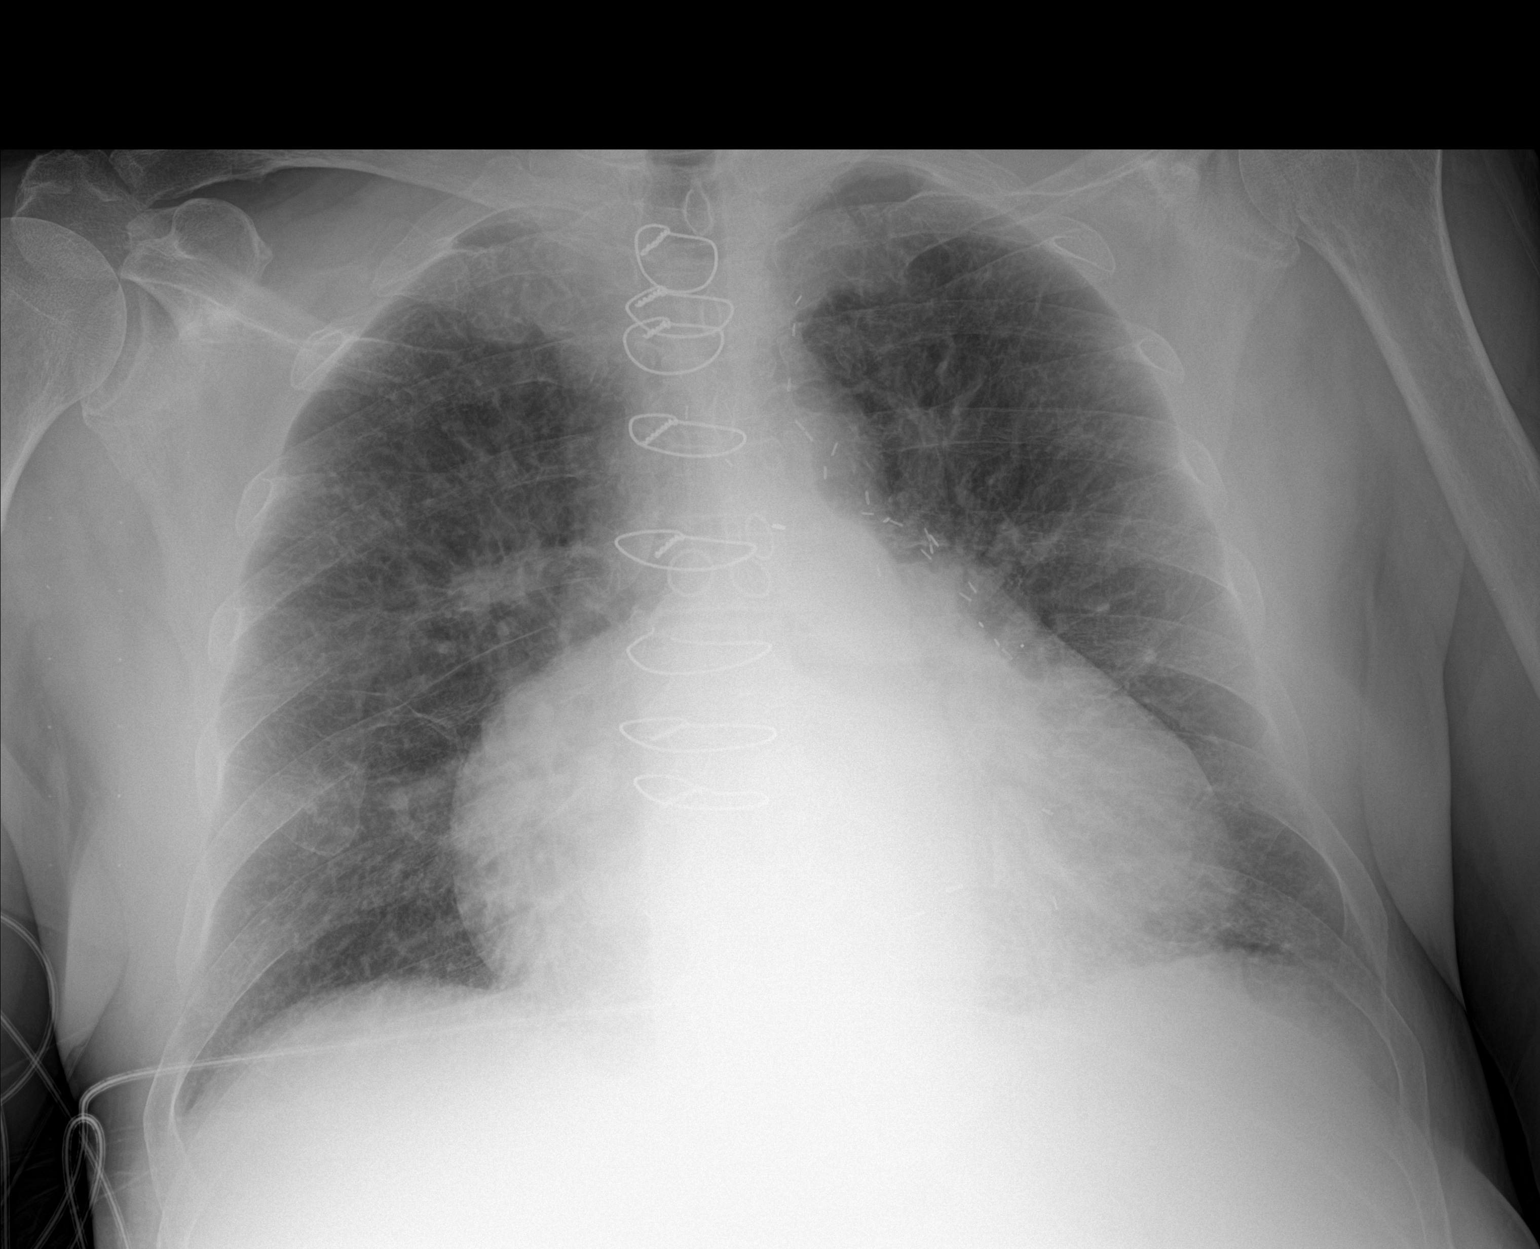

[1 of 1 positions shown; findings below may reference images not displayed]

FINDINGS: Decreased inspiration. No gross change in enlargement of the cardiac
silhouette with a globular configuration. Stable post CABG changes.
Mild prominence of the interstitial markings, accentuated by the
poor inspiration. No pleural fluid seen.
IMPRESSION: No acute abnormality. Grossly stable cardiomegaly and mild chronic
interstitial lung disease. The globular configuration of the heart
suggests the possibility of a pericardial effusion.

## 2018-02-21 IMAGING — CT CT HEAD W/O CM
3 of 6 series · 16 of 47 positions shown, 19 images · non-contrast
Comparison: 12/02/2016

CLINICAL DATA: Patient found unresponsive.  Suspected overdose.

EXAM:
CT HEAD WITHOUT CONTRAST
TECHNIQUE: Contiguous axial images were obtained from the base of the skull
through the vertex without intravenous contrast.

[Series 2: head wo · axial · 0.45mm/px · z∈[-133,+17]mm · 11 of 36 slices shown, 14 images]
[im 3/36  brain]
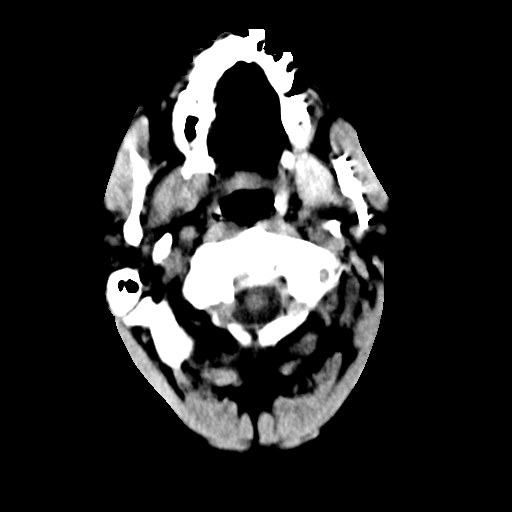
[im 3/36  bone]
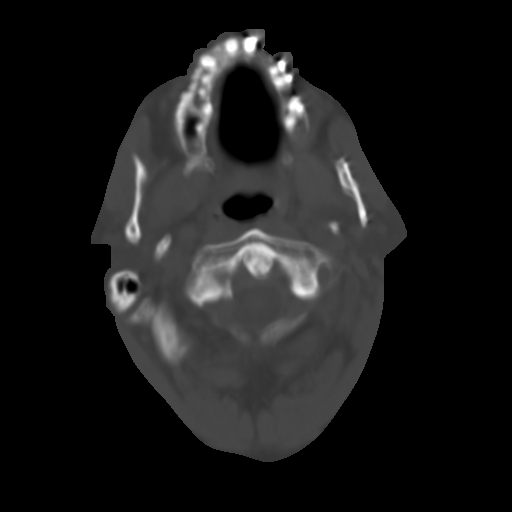
[im 6/36  brain]
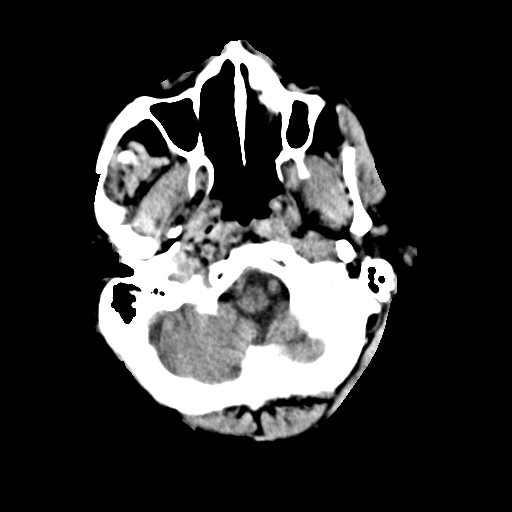
[im 8/36  brain]
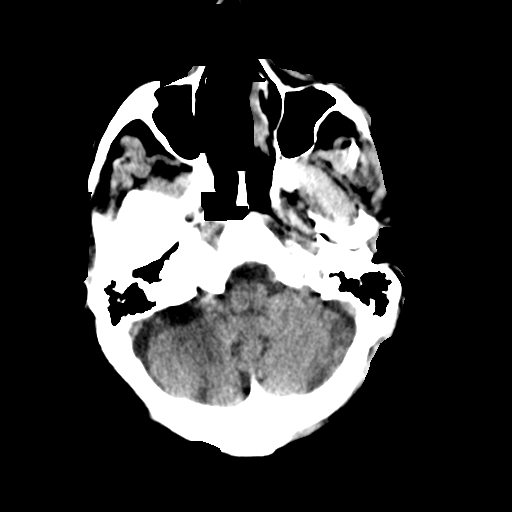
[im 13/36  brain]
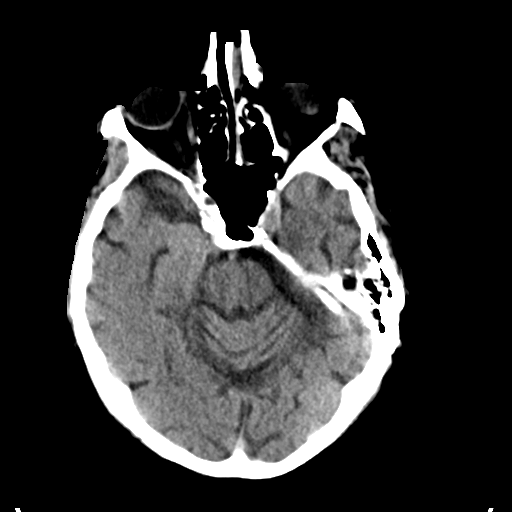
[im 16/36  brain]
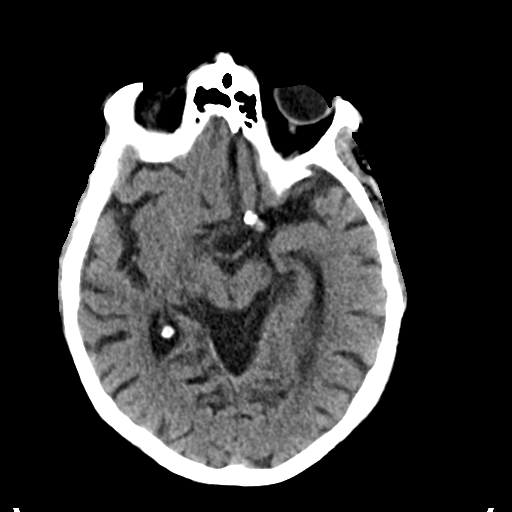
[im 16/36  bone]
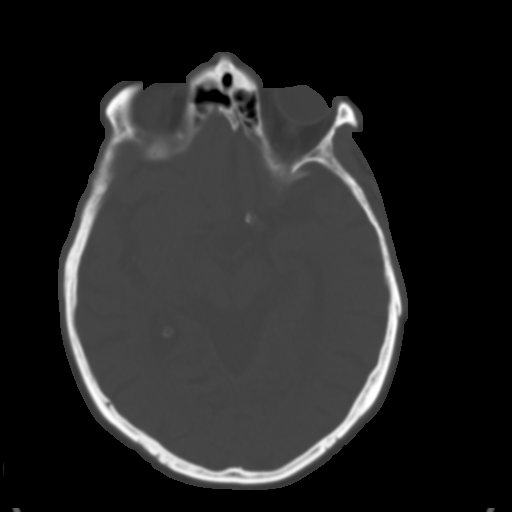
[im 18/36  brain]
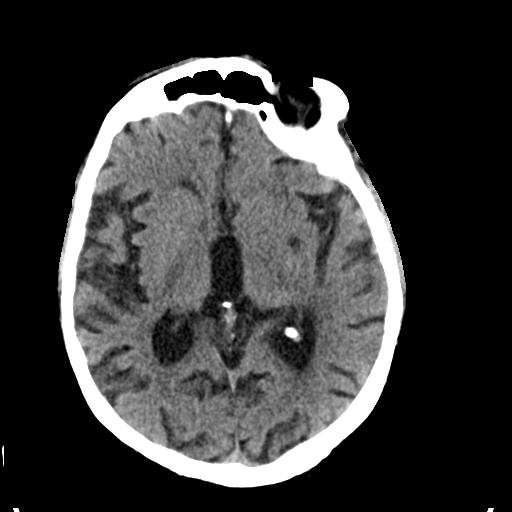
[im 21/36  brain]
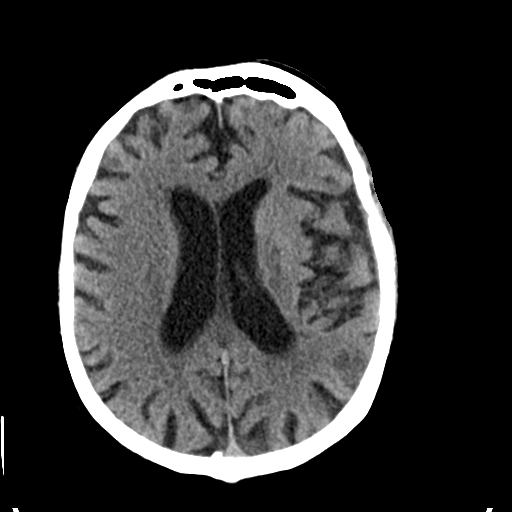
[im 23/36  brain]
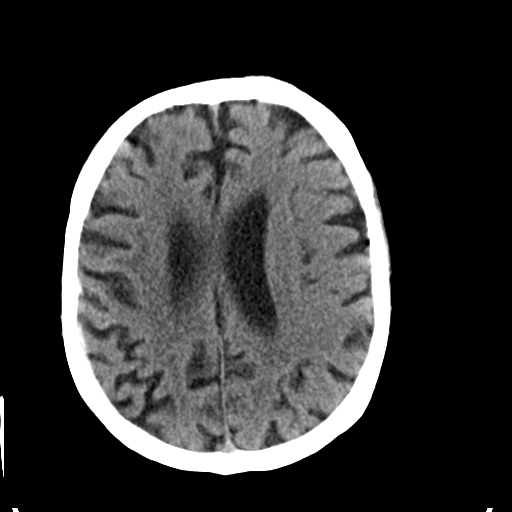
[im 28/36  brain]
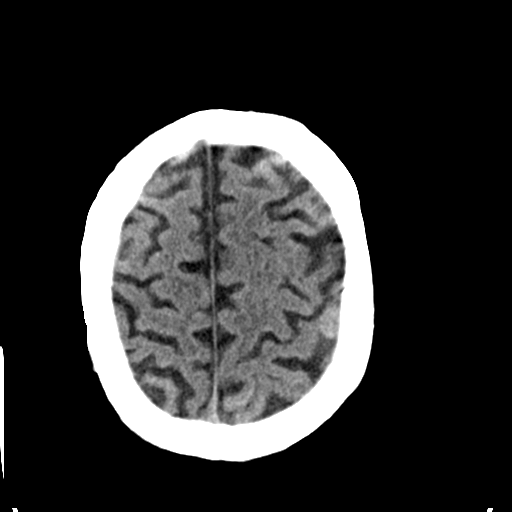
[im 28/36  bone]
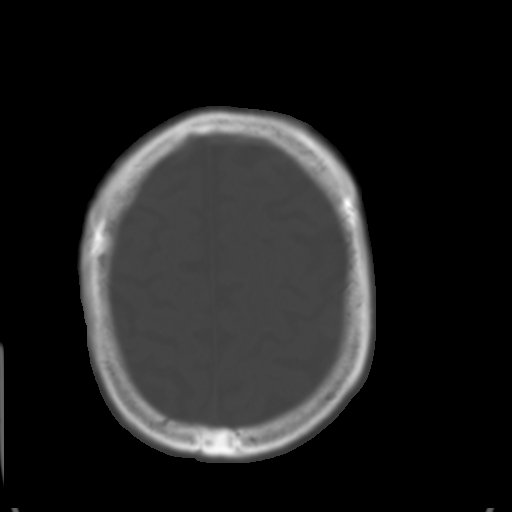
[im 31/36  brain]
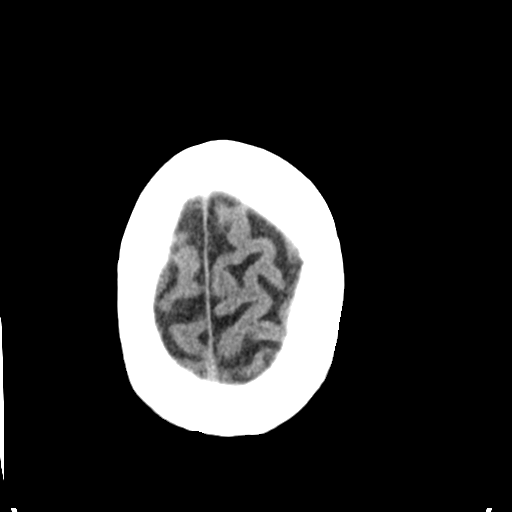
[im 33/36  brain]
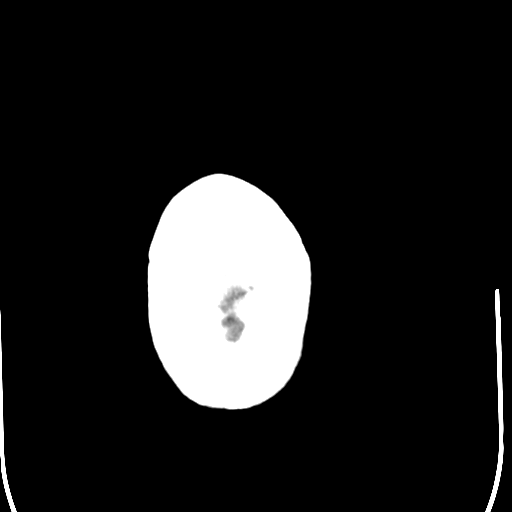

[Series 4: coronal soft tissue · coronal · 0.34mm/px · 3 of 75 slices shown]
[im 19/75  brain]
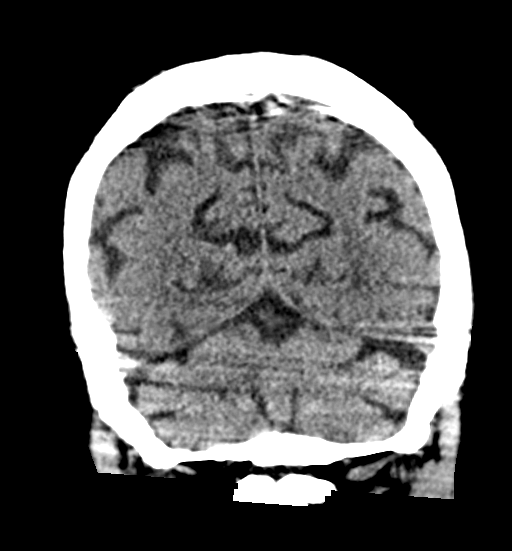
[im 38/75  brain]
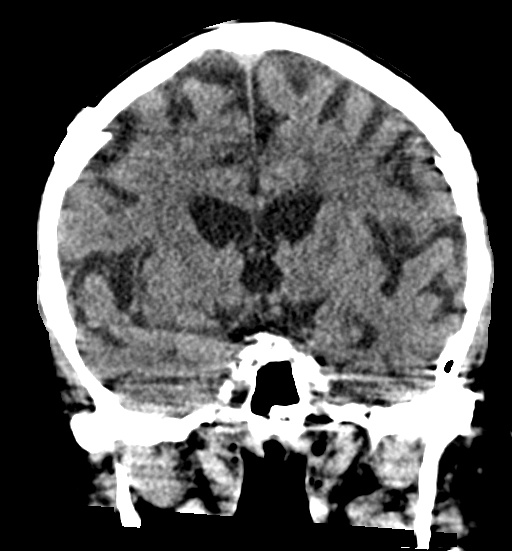
[im 56/75  brain]
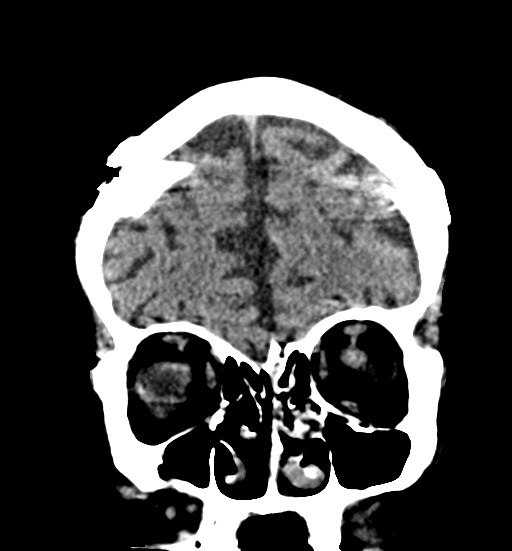

[Series 9: sagittal soft tissue · sagittal · 0.40mm/px · 2 of 58 slices shown]
[im 20/58  brain]
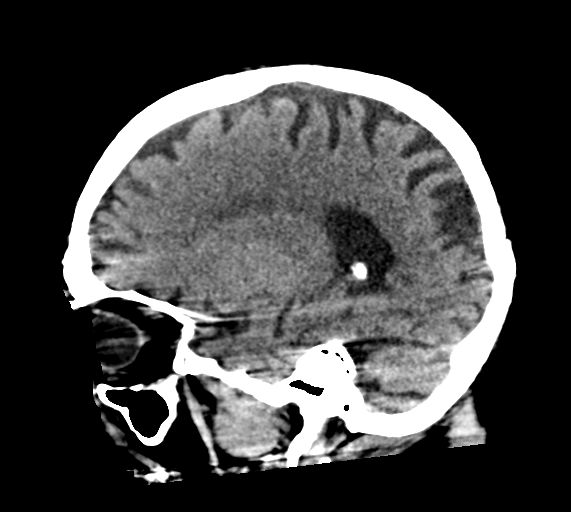
[im 39/58  brain]
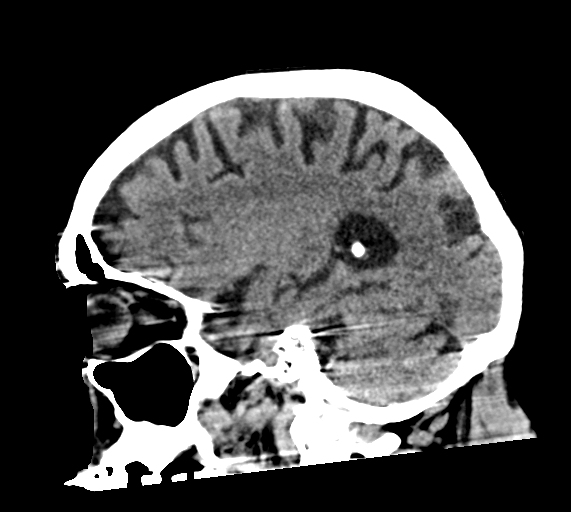

[16 of 47 positions shown; findings below may reference images not displayed]

FINDINGS: Brain: No evidence of acute infarction, hemorrhage, hydrocephalus,
extra-axial collection or mass lesion/mass effect. Moderate brain
parenchymal volume loss and periventricular microangiopathy.

Vascular: Calcific atherosclerotic disease at the skullbase.

Skull: Normal. Negative for fracture or focal lesion.

Sinuses/Orbits: No acute finding.

Other: None.
IMPRESSION: No acute intracranial abnormality.

Atrophy, chronic microvascular disease.

## 2018-02-21 MED ORDER — OXYCODONE HCL 15 MG PO TABS: 15.0000 mg | ORAL_TABLET | Freq: Four times a day (QID) | ORAL | 0 refills | Status: DC | PRN

## 2018-02-21 NOTE — Progress Notes (Signed)
Patient: Vincent Mason Male    DOB: 09-05-1935   82 y.o.   MRN: [id number] Visit Date: 02/21/2018  Today's Provider: Lelon Huh, MD   Chief Complaint  Patient presents with  . Follow-up   Subjective:    HPI DDD: Patient was last seen for this problem 4 months ago and no changes were made. Patient reports good compliance with treatment, good tolerance and good symptom control. He has been calling about 6 days early for refills because, he complains, that it takes 3 days for [place] to approve refill and 3 days for pharmacy to fill once they receive approval. He complains that he had to go without his pain medication for 4 days this month waiting for refill, although review of [employer] indicates his been getting prescription refilled every 28-31 days.   CKD: Patient was last seen for this problem 4 months ago and no changes were made. He continues follow up with Dr. Candiss Norse 2-3 times a year and states he has appointment coming up.   Chronic Atrial Fibrillation: Patient was last seen for this problem 4 months ago and no changes were made. Patient was to continue routine follow up with cardiology. Patient reports good compliance with treatment. No significant dyspnea recently. No chest pain.   Edema: Patient was last seen for this problem 4 months ago and no changes were made. Patient reports this problem is stable.     Allergies  Allergen Reactions  . Decongestant  [Oxymetazoline]      Current Outpatient Medications:  .  apixaban (ELIQUIS) 5 MG TABS tablet, Take 1 tablet (5 mg total) by mouth 2 (two) times daily. (Patient taking differently: Take 2.5 mg by mouth 2 (two) times daily. ), Disp: 60 tablet, Rfl: 0 .  aspirin EC 81 MG EC tablet, Take 1 tablet (81 mg total) by mouth daily., Disp: 30 tablet, Rfl: 0 .  Cholecalciferol (VITAMIN D3) 1000 units CAPS, Take by mouth daily., Disp: , Rfl:  .  digoxin (LANOXIN) 0.125 MG tablet, Take 0.125 mg by mouth daily., Disp: , Rfl:  .   divalproex (DEPAKOTE) 250 MG DR tablet, Take 1 tablet by mouth 2 (two) times daily., Disp: , Rfl:  .  ferrous sulfate 325 (65 FE) MG tablet, Take 1 tablet (325 mg total) by mouth 2 (two) times daily with a meal., Disp: 60 tablet, Rfl: 3 .  metoprolol succinate (TOPROL-XL) 25 MG 24 hr tablet, Take 0.5 tablets (12.5 mg total) by mouth daily., Disp: 30 tablet, Rfl: 5 .  oxyCODONE (ROXICODONE) 15 MG immediate release tablet, Take 1 tablet (15 mg total) by mouth every 6 (six) hours as needed for pain., Disp: 120 tablet, Rfl: 0 .  polyethylene glycol (MIRALAX / GLYCOLAX) packet, Take 17 g by mouth daily., Disp: 14 each, Rfl: 0 .  ranitidine (ZANTAC) 150 MG tablet, Take 150 mg by mouth 2 (two) times daily., Disp: , Rfl:  .  spironolactone (ALDACTONE) 25 MG tablet, Take 25 mg daily by mouth., Disp: , Rfl:  .  torsemide (DEMADEX) 20 MG tablet, Take 20 mg daily by mouth., Disp: , Rfl:  .  ULORIC 80 MG TABS, TAKE ONE (1) TABLET EACH DAY - STOP TAKING ALLOPURINOL., Disp: 30 tablet, Rfl: 11 .  methocarbamol (ROBAXIN) 750 MG tablet, Take 750 mg by mouth at bedtime as needed for muscle spasms., Disp: , Rfl:  .  metolazone (ZAROXOLYN) 5 MG tablet, Take 5 mg by mouth daily as needed (swelling). ,  Disp: , Rfl:   Review of Systems  Constitutional: Negative for appetite change, chills and fever.  Respiratory: Negative for chest tightness, shortness of breath and wheezing.   Cardiovascular: Negative for chest pain and palpitations.  Gastrointestinal: Negative for abdominal pain, nausea and vomiting.  Musculoskeletal: Positive for back pain.    Social History   Tobacco Use  . Smoking status: Former Research scientist (life sciences)  . Smokeless tobacco: Never Used  . Tobacco comment: Quit in 1990  Substance Use Topics  . Alcohol use: No   Objective:   BP 103/67 (BP Location: Left Arm, Patient Position: Sitting, Cuff Size: Large)   Pulse (!) 51   Temp 98.3 F (36.8 C) (Oral)   Resp 16   Wt 189 lb (85.7 kg)   SpO2 96% Comment:  room air  BMI 27.12 kg/m  Vitals:   02/21/18 0806  BP: 103/67  Pulse: (!) 51  Resp: 16  Temp: 98.3 F (36.8 C)  TempSrc: Oral  SpO2: 96%  Weight: 189 lb (85.7 kg)     Physical Exam   General Appearance:    Alert, cooperative, no distress  Eyes:    PERRL, conjunctiva/corneas clear, EOM's intact       Lungs:     Clear to auscultation bilaterally, respirations unlabored  Heart:     Irregularly irregular rhythm. Normal rate   Neurologic:   Awake, alert, oriented x 3. No apparent focal neurological           defect.   Ext: Several varicosities on both legs.2+ blateral LE edema. No erythema. No seeping.        Assessment & Plan:     1. Degenerative disc disease, lumbar Doing well with oxycodone, but he is somewhat obsessive about calling for refill because he thinks its going to take several days to get approval. Will starting printing 3 x 1 month prescriptions for him to keep or for pharmacy to keep on file.  - oxyCODONE (ROXICODONE) 15 MG immediate release tablet; Take 1 tablet (15 mg total) by mouth every 6 (six) hours as needed for pain. May fill on or after October 15th, 2019  Dispense: 120 tablet; Refill: 0 - oxyCODONE (ROXICODONE) 15 MG immediate release tablet; Take 1 tablet (15 mg total) by mouth every 6 (six) hours as needed for pain. May fill on or after November 14th, 2019  Dispense: 120 tablet; Refill: 0 - oxyCODONE (ROXICODONE) 15 MG immediate release tablet; Take 1 tablet (15 mg total) by mouth every 6 (six) hours as needed for pain. May be filled on or after December 14th, 2019  Dispense: 120 tablet; Refill: 0  2. Essential hypertension Well controlled.  Continue current medications.    3. Hypertensive pulmonary vascular disease (HCC) Breathing is doing well on current regiment of diuretics. Continue regular cardiology and nephrology follow ups.   4. Chronic atrial fibrillation (HCC) Stable Continue current medications.    5. Uncomplicated opioid dependence  ([place])   6. Need for influenza vaccination  - Flu vaccine HIGH DOSE PF (Fluzone High dose)        Lelon Huh, MD  [facility]

## 2018-02-25 ENCOUNTER — Other Ambulatory Visit: Payer: Self-pay | Admitting: Family Medicine

## 2018-02-28 DIAGNOSIS — R569 Unspecified convulsions: Secondary | ICD-10-CM | POA: Diagnosis not present

## 2018-03-26 ENCOUNTER — Other Ambulatory Visit: Payer: Self-pay | Admitting: Family Medicine

## 2018-03-28 ENCOUNTER — Telehealth: Payer: Self-pay | Admitting: Family Medicine

## 2018-03-28 DIAGNOSIS — N529 Male erectile dysfunction, unspecified: Secondary | ICD-10-CM

## 2018-03-28 NOTE — Telephone Encounter (Signed)
Okay to refer?  Thanks,   -Mickel Baas

## 2018-03-28 NOTE — Telephone Encounter (Signed)
Pt called saying he has a "male" problem that he would like to talk to Dr. Caryn Section about.  He is taking Viagra and he says it is not working.  He wants to talk about a referral to [facility]. To have a procedure that he has heard about.  CB#  [phone]  Thanks  Con Memos

## 2018-03-31 DIAGNOSIS — Z961 Presence of intraocular lens: Secondary | ICD-10-CM | POA: Diagnosis not present

## 2018-04-18 ENCOUNTER — Ambulatory Visit (INDEPENDENT_AMBULATORY_CARE_PROVIDER_SITE_OTHER): Payer: Medicare Other | Admitting: Family Medicine

## 2018-04-18 ENCOUNTER — Encounter: Payer: Self-pay | Admitting: Family Medicine

## 2018-04-18 VITALS — BP 103/60 | HR 91 | Temp 97.9°F | Resp 16 | Wt 188.8 lb

## 2018-04-18 DIAGNOSIS — S20211A Contusion of right front wall of thorax, initial encounter: Secondary | ICD-10-CM

## 2018-04-18 NOTE — Progress Notes (Signed)
  Subjective:     Patient ID: Vincent Mason, male   DOB: [birth year], 82 y.o.   MRN: [id number] Chief Complaint  Patient presents with  . Skin Problem    Patient comes in office today for skin check. Patient states for the past week he has noticed a bump/nodule in the middle of his chest. Patient denies heavy lifting or injury.   HPI States he felt an irregular area adjacent to his sternal incision from remote CABG. It is non-tender and he is concerned a wire may have come loose. Bruising has appeared after he has been rubbing it (on Eliquis).  Review of Systems     Objective:   Physical Exam  Constitutional: He appears well-developed and well-nourished. No distress.  Skin:  Mid-sternal contusion with underlying mobile induration c/w sternal surgical wire sutures. No tenderness or drainage.       Assessment:    1. Contusion of right chest wall, initial encounter     Plan:    Monitor. Apply warm compresses.

## 2018-04-18 NOTE — Patient Instructions (Addendum)
Monitor for improvement. Heat may hasten resolution of the bruising.

## 2018-04-25 ENCOUNTER — Encounter: Payer: Self-pay | Admitting: Family Medicine

## 2018-04-25 ENCOUNTER — Ambulatory Visit (INDEPENDENT_AMBULATORY_CARE_PROVIDER_SITE_OTHER): Payer: Medicare Other | Admitting: Family Medicine

## 2018-04-25 VITALS — BP 108/72 | HR 84 | Temp 98.0°F | Resp 16 | Wt 186.0 lb

## 2018-04-25 DIAGNOSIS — M5136 Other intervertebral disc degeneration, lumbar region: Secondary | ICD-10-CM | POA: Diagnosis not present

## 2018-04-25 DIAGNOSIS — S20211D Contusion of right front wall of thorax, subsequent encounter: Secondary | ICD-10-CM | POA: Diagnosis not present

## 2018-04-25 MED ORDER — OXYCODONE HCL 15 MG PO TABS: 15.0000 mg | ORAL_TABLET | Freq: Four times a day (QID) | ORAL | 0 refills | Status: DC | PRN

## 2018-04-25 NOTE — Addendum Note (Signed)
Addended by: Birdie Sons on: 04/25/2018 03:18 PM   Modules accepted: Level of Service

## 2018-04-25 NOTE — Patient Instructions (Signed)
   It will take several months for the nodule on your chest to go away.   If it becomes painful or gets larger after another two weeks, then call for Xray order.

## 2018-04-25 NOTE — Progress Notes (Signed)
Patient: Vincent Mason Male    DOB: 1935-07-06   82 y.o.   MRN: [id number] Visit Date: 04/25/2018  Today's Provider: Lelon Huh, MD   Chief Complaint  Patient presents with  . Mass    On chest.  First noticed about a week ago.    Subjective:    HPI  Pt comes in today for a follow up about a "knot" on his chest.  He first noticed it last week and was evaluated by Mariel Sleet and was advised to apply heat. Hhe states the knot seems slightly larger now.  He denies any drainage, tenderness.  There is some bruising but he is on a blood thinner.  It is not painful at all. He does not recall any recent injury, but has remote history of CABG.     Allergies  Allergen Reactions  . Decongestant  [Oxymetazoline]      Current Outpatient Medications:  .  apixaban (ELIQUIS) 5 MG TABS tablet, Take 1 tablet (5 mg total) by mouth 2 (two) times daily. (Patient taking differently: Take 2.5 mg by mouth 2 (two) times daily. ), Disp: 60 tablet, Rfl: 0 .  aspirin EC 81 MG EC tablet, Take 1 tablet (81 mg total) by mouth daily., Disp: 30 tablet, Rfl: 0 .  Cholecalciferol (VITAMIN D3) 1000 units CAPS, Take by mouth daily., Disp: , Rfl:  .  digoxin (LANOXIN) 0.125 MG tablet, Take 0.125 mg by mouth daily., Disp: , Rfl:  .  Febuxostat 80 MG TABS, Take 1 tablet (80 mg total) by mouth daily., Disp: 30 tablet, Rfl: 5 .  ferrous sulfate 325 (65 FE) MG tablet, Take 1 tablet (325 mg total) by mouth 2 (two) times daily with a meal., Disp: 60 tablet, Rfl: 3 .  methocarbamol (ROBAXIN) 750 MG tablet, Take 750 mg by mouth at bedtime as needed for muscle spasms., Disp: , Rfl:  .  metolazone (ZAROXOLYN) 5 MG tablet, Take 5 mg by mouth daily as needed (swelling). , Disp: , Rfl:  .  metoprolol succinate (TOPROL-XL) 25 MG 24 hr tablet, Take 0.5 tablets (12.5 mg total) by mouth daily., Disp: 30 tablet, Rfl: 5 .  oxyCODONE (ROXICODONE) 15 MG immediate release tablet, Take 1 tablet (15 mg total) by mouth every 6 (six)  hours as needed for pain. May fill on or after October 15th, 2019, Disp: 120 tablet, Rfl: 0 .  oxyCODONE (ROXICODONE) 15 MG immediate release tablet, Take 1 tablet (15 mg total) by mouth every 6 (six) hours as needed for pain. May fill on or after November 14th, 2019, Disp: 120 tablet, Rfl: 0 .  oxyCODONE (ROXICODONE) 15 MG immediate release tablet, Take 1 tablet (15 mg total) by mouth every 6 (six) hours as needed for pain. May be filled on or after December 14th, 2019, Disp: 120 tablet, Rfl: 0 .  polyethylene glycol (MIRALAX / GLYCOLAX) packet, Take 17 g by mouth daily., Disp: 14 each, Rfl: 0 .  ranitidine (ZANTAC) 150 MG tablet, Take 150 mg by mouth 2 (two) times daily., Disp: , Rfl:  .  sildenafil (REVATIO) 20 MG tablet, TAKE ONE TABLET 3 TIMES A DAY AS NEEDED, Disp: 30 tablet, Rfl: 11 .  spironolactone (ALDACTONE) 25 MG tablet, Take 25 mg daily by mouth., Disp: , Rfl:  .  torsemide (DEMADEX) 20 MG tablet, Take 20 mg daily by mouth., Disp: , Rfl:  .  divalproex (DEPAKOTE) 250 MG DR tablet, Take 1 tablet by mouth 2 (two)  times daily., Disp: , Rfl:   Review of Systems  Constitutional: Negative.   Musculoskeletal: Negative for arthralgias, back pain, gait problem, joint swelling, myalgias, neck pain and neck stiffness.  Skin: Positive for color change. Negative for pallor, rash and wound.  Neurological: Negative for dizziness, light-headedness and headaches.    Social History   Tobacco Use  . Smoking status: Former Research scientist (life sciences)  . Smokeless tobacco: Never Used  . Tobacco comment: Quit in 1990  Substance Use Topics  . Alcohol use: No   Objective:   BP 108/72 (BP Location: Right Arm, Patient Position: Sitting, Cuff Size: Large)   Pulse 84   Temp 98 F (36.7 C) (Oral)   Resp 16   Wt 186 lb (84.4 kg)   BMI 26.69 kg/m  Vitals:   04/25/18 1439  BP: 108/72  Pulse: 84  Resp: 16  Temp: 98 F (36.7 C)  TempSrc: Oral  Weight: 186 lb (84.4 kg)     Physical Exam  Pea sized firm non  tender nodule of sternum just right of lower CABG scar. Purplish discoloration around lesion. No open wounds or discharge.     Assessment & Plan:     1. Degenerative disc disease, lumbar refill - oxyCODONE (ROXICODONE) 15 MG immediate release tablet; Take 1 tablet (15 mg total) by mouth every 6 (six) hours as needed for pain.  Dispense: 120 tablet; Refill: 0  2. Contusion of right chest wall, subsequent encounter Is not painful at all. Offered xray of sternum for further evaluation. Patient declined today, but states he will call back for order is area becomes painful or does not resolve in a few months.        Lelon Huh, MD  [facility]

## 2018-04-26 DIAGNOSIS — N183 Chronic kidney disease, stage 3 (moderate): Secondary | ICD-10-CM | POA: Diagnosis not present

## 2018-04-26 DIAGNOSIS — E876 Hypokalemia: Secondary | ICD-10-CM | POA: Diagnosis not present

## 2018-04-26 DIAGNOSIS — I129 Hypertensive chronic kidney disease with stage 1 through stage 4 chronic kidney disease, or unspecified chronic kidney disease: Secondary | ICD-10-CM | POA: Diagnosis not present

## 2018-04-26 DIAGNOSIS — I272 Pulmonary hypertension, unspecified: Secondary | ICD-10-CM | POA: Diagnosis not present

## 2018-04-26 DIAGNOSIS — R600 Localized edema: Secondary | ICD-10-CM | POA: Diagnosis not present

## 2018-04-29 DIAGNOSIS — N183 Chronic kidney disease, stage 3 (moderate): Secondary | ICD-10-CM | POA: Diagnosis not present

## 2018-04-29 DIAGNOSIS — R600 Localized edema: Secondary | ICD-10-CM | POA: Diagnosis not present

## 2018-04-29 DIAGNOSIS — I272 Pulmonary hypertension, unspecified: Secondary | ICD-10-CM | POA: Diagnosis not present

## 2018-05-03 ENCOUNTER — Encounter: Payer: Self-pay | Admitting: Urology

## 2018-05-03 ENCOUNTER — Ambulatory Visit (INDEPENDENT_AMBULATORY_CARE_PROVIDER_SITE_OTHER): Payer: Medicare Other | Admitting: Urology

## 2018-05-03 VITALS — BP 117/72 | HR 106 | Ht 71.0 in | Wt 187.2 lb

## 2018-05-03 DIAGNOSIS — N5201 Erectile dysfunction due to arterial insufficiency: Secondary | ICD-10-CM | POA: Diagnosis not present

## 2018-05-03 NOTE — Progress Notes (Signed)
05/03/2018 2:48 PM   Vincent Mason [birth year] [id number]  Referring provider: Birdie Sons, [address] [place], [place] [address]  Chief Complaint  Patient presents with  . Erectile Dysfunction    HPI: 82 year old male seen at the request of Dr. Caryn Section for evaluation of erectile dysfunction.  He presents with an approximately 89-monthhistory of difficulty achieving and maintaining an erection.  He states for the most part he has no erectile activity however will occasionally have partial erections which are not firm enough for penetration.  He denies pain or curvature with erections.  He has significant organic risk factors including coronary artery disease, hyperlipidemia, hypertension, antihypertensive medications including beta-blockers as well as a prior tobacco history.  He has tried Viagra and generic sildenafil without improvement.   PMH: Past Medical History:  Diagnosis Date  . Acute encephalopathy 09/03/2015  . Atrial [facility] and flutter [facility] 2013   [facility]   . C. difficile colitis 09/12/2015  . [facility] obstructive [facility] disease) [facility]   . HCAP (healthcare-associated pneumonia) 09/12/2015  . Hyperlipidemia   . Hypertension   . Kidney disease, chronic, stage III (moderate, EGFR 30-59 ml/min) ([facility]    ARF 2013  . OSA (obstructive sleep apnea)    non-compliant with CPAP.   .Marland KitchenSevere sepsis with septic shock ([facility] 09/12/2015    Surgical History: Past Surgical History:  Procedure Laterality Date  . CATARACT EXTRACTION    . CORONARY ARTERY BYPASS GRAFT  10/1997  . history of cervical discectomy  2009   C5-C6  . Hyperplastic colon polyp  02/2002   Sigmoid polyps    Home Medications:  Allergies as of 05/03/2018      Reactions   Decongestant  [oxymetazoline]       Medication List        Accurate as of 05/03/18  2:48 PM. Always use your most recent med list.          apixaban 5 MG Tabs  tablet Commonly known as:  ELIQUIS Take 1 tablet (5 mg total) by mouth 2 (two) times daily.   aspirin 81 MG EC tablet Take 1 tablet (81 mg total) by mouth daily.   digoxin 0.125 MG tablet Commonly known as:  LANOXIN Take 0.125 mg by mouth daily.   divalproex 250 MG DR tablet Commonly known as:  DEPAKOTE Take 1 tablet by mouth 2 (two) times daily.   Febuxostat 80 MG Tabs Take 1 tablet (80 mg total) by mouth daily.   ferrous sulfate 325 (65 FE) MG tablet Take 1 tablet (325 mg total) by mouth 2 (two) times daily with a meal.   methocarbamol 750 MG tablet Commonly known as:  ROBAXIN Take 750 mg by mouth at bedtime as needed for muscle spasms.   metolazone 5 MG tablet Commonly known as:  ZAROXOLYN Take 5 mg by mouth daily as needed (swelling).   metoprolol succinate 25 MG 24 hr tablet Commonly known as:  TOPROL-XL Take 0.5 tablets (12.5 mg total) by mouth daily.   oxyCODONE 15 MG immediate release tablet Commonly known as:  ROXICODONE Take 1 tablet (15 mg total) by mouth every 6 (six) hours as needed for pain. May fill on or after November 14th, 2019   oxyCODONE 15 MG immediate release tablet Commonly known as:  ROXICODONE Take 1 tablet (15 mg total) by mouth every 6 (six) hours as needed for pain. May be filled on or after December 14th,  2019   oxyCODONE 15 MG immediate release tablet Commonly known as:  ROXICODONE Take 1 tablet (15 mg total) by mouth every 6 (six) hours as needed for pain.   polyethylene glycol packet Commonly known as:  MIRALAX / GLYCOLAX Take 17 g by mouth daily.   ranitidine 150 MG tablet Commonly known as:  ZANTAC Take 150 mg by mouth 2 (two) times daily.   sildenafil 20 MG tablet Commonly known as:  REVATIO TAKE ONE TABLET 3 TIMES A DAY AS NEEDED   spironolactone 25 MG tablet Commonly known as:  ALDACTONE Take 25 mg daily by mouth.   torsemide 20 MG tablet Commonly known as:  DEMADEX Take 20 mg daily by mouth.   Vitamin D3 25 MCG  (1000 UT) Caps Take by mouth daily.       Allergies:  Allergies  Allergen Reactions  . Decongestant  [Oxymetazoline]     Family History: Family History  Problem Relation Age of Onset  . Hypertension Mother   . Hypertension Father     Social History:  reports that he has quit smoking. He has never used smokeless tobacco. He reports that he does not drink alcohol or use drugs.  ROS: UROLOGY Frequent Urination?: No Hard to postpone urination?: No Burning/pain with urination?: No Get up at night to urinate?: No Leakage of urine?: No Urine stream starts and stops?: No Trouble starting stream?: No Do you have to strain to urinate?: No Blood in urine?: No Urinary tract infection?: No Sexually transmitted disease?: No Injury to kidneys or bladder?: No Painful intercourse?: No Weak stream?: No Erection problems?: Yes Penile pain?: No  Gastrointestinal Nausea?: No Vomiting?: No Indigestion/heartburn?: No Diarrhea?: No Constipation?: No  Constitutional Fever: No Night sweats?: No Weight loss?: No Fatigue?: No  Skin Skin rash/lesions?: No Itching?: No  Eyes Blurred vision?: No Double vision?: No  Ears/Nose/Throat Sore throat?: No Sinus problems?: No  Hematologic/Lymphatic Swollen glands?: No Easy bruising?: No  Cardiovascular Leg swelling?: No Chest pain?: No  Respiratory Cough?: No Shortness of breath?: No  Endocrine Excessive thirst?: No  Musculoskeletal Back pain?: No Joint pain?: No  Neurological Headaches?: No Dizziness?: No  Psychologic Depression?: No Anxiety?: No  Physical Exam: BP 117/72 (BP Location: Left Arm, Patient Position: Sitting, Cuff Size: Large)   Pulse (!) 106   Ht 5' 11" (1.803 m)   Wt 187 lb 3.2 oz (84.9 kg)   BMI 26.11 kg/m   Constitutional:  Alert and oriented, No acute distress. HEENT: [place] AT, moist mucus membranes.  Trachea midline, no masses. Cardiovascular: No clubbing, cyanosis, or  edema. Respiratory: Normal respiratory effort, no increased work of breathing. GI: Abdomen is soft, nontender, nondistended, no abdominal masses GU: No CVA tenderness.  Penis circumcised without lesions or plaques.  Testes descended bilaterally without masses or tenderness.  Estimated volume 15 cc bilaterally. Lymph: No cervical or inguinal lymphadenopathy. Skin: No rashes, bruises or suspicious lesions. Neurologic: Grossly intact, no focal deficits, moving all 4 extremities. Psychiatric: Normal mood and affect.    Assessment & Plan:   82-year-old male with erectile dysfunction and significant organic risk factors.  He has failed PDE 5 inhibitor therapy.  I discussed second line options including vacuum erection devices and intracavernosal injections.  He was interested in pursuing intracavernosal injections.  Rx Trimix was sent to [facility].  He will be scheduled for injection training with myself or our physician's assistant.  Potential complications intracavernosal injections were discussed including corporal scarring and priapism.  Scott C   Stoioff, MD  [facility] [address], [address] [place], [place] [address] [phone]  

## 2018-05-04 ENCOUNTER — Encounter: Payer: Self-pay | Admitting: Urology

## 2018-05-04 DIAGNOSIS — N5201 Erectile dysfunction due to arterial insufficiency: Secondary | ICD-10-CM | POA: Insufficient documentation

## 2018-06-01 ENCOUNTER — Telehealth: Payer: Self-pay | Admitting: Family Medicine

## 2018-06-01 DIAGNOSIS — M51369 Other intervertebral disc degeneration, lumbar region without mention of lumbar back pain or lower extremity pain: Secondary | ICD-10-CM

## 2018-06-01 DIAGNOSIS — M5136 Other intervertebral disc degeneration, lumbar region: Secondary | ICD-10-CM

## 2018-06-01 MED ORDER — OXYCODONE HCL 15 MG PO TABS: 15.0000 mg | ORAL_TABLET | Freq: Four times a day (QID) | ORAL | 0 refills | Status: DC | PRN

## 2018-06-01 NOTE — Telephone Encounter (Signed)
Patient wants to get a hard copy of pres for oxycodone 15 mg. He is going out of town Friday and wants to take it with him.   Patient said to date it for when he is due to get it. Call patient when ready.

## 2018-06-01 NOTE — Telephone Encounter (Signed)
Prescription has been printed.

## 2018-06-01 NOTE — Telephone Encounter (Deleted)
As of January 1 Medicare no longer allows printed prescriptions for for oxycodone. I can print him a prescription, but pharmacy probably won't fill it unless he pays out of pocket.

## 2018-06-03 NOTE — Telephone Encounter (Signed)
rx has been picked up

## 2018-06-03 NOTE — Telephone Encounter (Signed)
Pt is needing the copy to pick up.  Please call pt today if ready.  Thanks, [employer]

## 2018-06-09 ENCOUNTER — Telehealth: Payer: Self-pay | Admitting: Family Medicine

## 2018-06-09 ENCOUNTER — Encounter: Payer: Self-pay | Admitting: Family Medicine

## 2018-06-09 ENCOUNTER — Ambulatory Visit (INDEPENDENT_AMBULATORY_CARE_PROVIDER_SITE_OTHER): Payer: Medicare Other | Admitting: Family Medicine

## 2018-06-09 VITALS — BP 115/72 | HR 82 | Temp 97.6°F | Resp 16 | Wt 181.0 lb

## 2018-06-09 DIAGNOSIS — R42 Dizziness and giddiness: Secondary | ICD-10-CM

## 2018-06-09 DIAGNOSIS — I959 Hypotension, unspecified: Secondary | ICD-10-CM

## 2018-06-09 NOTE — Telephone Encounter (Signed)
Advised Mr. Chohan to hold the Spironolactone for two days and to STOP ropinirole.   Thanks,   -Mickel Baas

## 2018-06-09 NOTE — Patient Instructions (Addendum)
.   Please bring all of your medications to every appointment so we can make sure that our medication list is the same as yours.    Do not take spironolactone for the next two days.

## 2018-06-09 NOTE — Progress Notes (Signed)
Patient: Vincent Mason Male    DOB: 11-08-1935   83 y.o.   MRN: [id number] Visit Date: 06/09/2018  Today's Provider: Lelon Huh, MD   Chief Complaint  Patient presents with  . Dizziness    x 1 week   Subjective:     Dizziness  This is a new problem. Episode onset: 1 week ago. The problem occurs intermittently. The problem has been unchanged. Associated symptoms include weakness. Pertinent negatives include no abdominal pain, chest pain, chills, fever, headaches, nausea, visual change or vomiting. The symptoms are aggravated by standing and walking. He has tried rest for the symptoms.   States he took 5 ropinirole tablets from old prescription about a week ago because his legs were bothering him. States he has not taken it since. He feels a little weak, but no other new sx or recent illnesses.   Allergies  Allergen Reactions  . Decongestant  [Oxymetazoline]      Current Outpatient Medications:  .  apixaban (ELIQUIS) 5 MG TABS tablet, Take 1 tablet (5 mg total) by mouth 2 (two) times daily. (Patient taking differently: Take 2.5 mg by mouth 2 (two) times daily. ), Disp: 60 tablet, Rfl: 0 .  aspirin EC 81 MG EC tablet, Take 1 tablet (81 mg total) by mouth daily., Disp: 30 tablet, Rfl: 0 .  Cholecalciferol (VITAMIN D3) 1000 units CAPS, Take by mouth daily., Disp: , Rfl:  .  digoxin (LANOXIN) 0.125 MG tablet, Take 0.125 mg by mouth daily., Disp: , Rfl:  .  divalproex (DEPAKOTE) 250 MG DR tablet, Take 1 tablet by mouth 2 (two) times daily., Disp: , Rfl:  .  Febuxostat 80 MG TABS, Take 1 tablet (80 mg total) by mouth daily., Disp: 30 tablet, Rfl: 5 (NOT TAKING PER PATIENT) .  ferrous sulfate 325 (65 FE) MG tablet, Take 1 tablet (325 mg total) by mouth 2 (two) times daily with a meal., Disp: 60 tablet, Rfl: 3 .  methocarbamol (ROBAXIN) 750 MG tablet, Take 750 mg by mouth at bedtime as needed for muscle spasms., Disp: , Rfl: (NOT TAKING PER PATIENT) .  metolazone (ZAROXOLYN) 5 MG  tablet, Take 5 mg by mouth daily as needed (swelling). , Disp: , Rfl:  .  metoprolol succinate (TOPROL-XL) 25 MG 24 hr tablet, Take 0.5 tablets (12.5 mg total) by mouth daily., Disp: 30 tablet, Rfl: 5 .  oxyCODONE (ROXICODONE) 15 MG immediate release tablet, Take 1 tablet (15 mg total) by mouth every 6 (six) hours as needed for pain. May be filled on or after December 14th, 2019, Disp: 120 tablet, Rfl: 0 .  oxyCODONE (ROXICODONE) 15 MG immediate release tablet, Take 1 tablet (15 mg total) by mouth every 6 (six) hours as needed for pain., Disp: 120 tablet, Rfl: 0 .  oxyCODONE (ROXICODONE) 15 MG immediate release tablet, Take 1 tablet (15 mg total) by mouth every 6 (six) hours as needed for pain. May fill on or after November 14th, 2019, Disp: 120 tablet, Rfl: 0 .  polyethylene glycol (MIRALAX / GLYCOLAX) packet, Take 17 g by mouth daily., Disp: 14 each, Rfl: 0 .  ranitidine (ZANTAC) 150 MG tablet, Take 150 mg by mouth 2 (two) times daily., Disp: , Rfl:  .  sildenafil (REVATIO) 20 MG tablet, TAKE ONE TABLET 3 TIMES A DAY AS NEEDED, Disp: 30 tablet, Rfl: 11 .  spironolactone (ALDACTONE) 25 MG tablet, Take 25 mg daily by mouth., Disp: , Rfl:  .  torsemide (DEMADEX)  20 MG tablet, Take 20 mg daily by mouth., Disp: , Rfl:   Review of Systems  Constitutional: Negative for appetite change, chills and fever.  Respiratory: Negative for chest tightness, shortness of breath and wheezing.   Cardiovascular: Negative for chest pain and palpitations.  Gastrointestinal: Negative for abdominal pain, nausea and vomiting.  Neurological: Positive for dizziness, tremors and weakness. Negative for headaches.    Social History   Tobacco Use  . Smoking status: Former Research scientist (life sciences)  . Smokeless tobacco: Never Used  . Tobacco comment: Quit in 1990  Substance Use Topics  . Alcohol use: No      Objective:   BP 115/72 (BP Location: Left Arm, Patient Position: Sitting, Cuff Size: Large)   Pulse 82   Temp 97.6 F (36.4 C)  (Oral)   Resp 16   Wt 181 lb (82.1 kg)   SpO2 98% Comment: room air  BMI 25.24 kg/m  Vitals:   06/09/18 1031  BP: 115/72  Pulse: 82  Resp: 16  Temp: 97.6 F (36.4 C)  TempSrc: Oral  SpO2: 98%  Weight: 181 lb (82.1 kg)    Orthostatic Vitals: Lying:  BP  122/70,  Pulse: 64 Sitting: BP  102/60,    Pulse: 93 Standing: BP 88/56,   Pulse: 103   Physical Exam   General Appearance:    Alert, cooperative, no distress  Eyes:    PERRL, conjunctiva/corneas clear, EOM's intact       Lungs:     Clear to auscultation bilaterally, respirations unlabored  Heart:     Irregularly irregular rhythm. Normal rate  Neurologic:   Awake, alert, oriented x 3. No apparent focal neurological           defect.           Assessment & Plan    1. Dizziness  - Comprehensive metabolic panel - CBC  2. Hypotension, unspecified hypotension type May have been precipitated by taking large dose ropinirole several days. He states he feels like sx are improving today, but is clearly orthostatic. Advised to hold spironolactone for the next two day while awaiting lab results. Advised not to take any ropinirole.  Also discussed recommendations to avoid Uloric for cardiac patients. He states he is no longer taking it and is not having any issues with gout. Will consider allopurinol if he does.      Lelon Huh, MD  [facility]

## 2018-06-09 NOTE — Telephone Encounter (Signed)
Pt's wife [name] is requesting call back. [name] stated pt was seen in the office today and Dr. Caryn Section advised for him to stop taking one of his medications. [name] stated they forgot the name of the medication he is supposed to stop and is requesting call back to get that information. Please advise. Thanks TNP

## 2018-06-10 ENCOUNTER — Telehealth: Payer: Self-pay

## 2018-06-10 ENCOUNTER — Ambulatory Visit: Payer: Medicare Other | Admitting: Urology

## 2018-06-10 ENCOUNTER — Telehealth: Payer: Self-pay | Admitting: Family Medicine

## 2018-06-10 NOTE — Telephone Encounter (Signed)
Called to confirm which medication, and patient reports that it is spironolactone. Advised patient do stay off of torsemide over the weekend. Patient verbally understands.

## 2018-06-10 NOTE — Telephone Encounter (Signed)
Patient called office to let Dr. Caryn Section know about his blood pressure reading, patient sates that he was seen by Dr. Caryn Section yesterday with complaints of low blood pressure and dizziness. Patient states that when he stands he feels dizzy, he checked his blood pressure this morning and it was 100/50. He wanted Dr. Caryn Section to be aware of this and also wanted to know what Dr. Caryn Section suggest that he do since he still has dizziness upon standing? KW

## 2018-06-10 NOTE — Telephone Encounter (Signed)
He needs to stay off of the spironolactone over the weekend. His blood pressure will come up after he has been off of it for a couple of days. His dizziness should go away once his blood pressure comes up. Let us know what his BP is on Monday

## 2018-06-10 NOTE — Telephone Encounter (Signed)
Advised patient as below.  

## 2018-06-10 NOTE — Telephone Encounter (Signed)
OK, we weren't told that he hasn't taken spironolactone for 2 weeks. Please verify that we are talking about the same pill. The spironolactone is 25mg  tablet prescribed by Dr. Candiss Norse. According to Dr. Keturah Barre last note on 04-29-2018 patient was supposed to stay on it.   If he truly is not taking spironolactone, then he needs to stop the torsemide over the weekend.

## 2018-06-10 NOTE — Telephone Encounter (Signed)
Patient called back Dr. Caryn Section to let him know that he has been off spirolactone for 2 weeks and dosent feel that a couple more days will make a difference, please advise. KW

## 2018-06-10 NOTE — Telephone Encounter (Signed)
Please review. Thanks!  

## 2018-06-11 LAB — COMPREHENSIVE METABOLIC PANEL
ALT: 20 IU/L (ref 0–44)
AST: 19 IU/L (ref 0–40)
Albumin/Globulin Ratio: 2 (ref 1.2–2.2)
Albumin: 4.9 g/dL — ABNORMAL HIGH (ref 3.5–4.7)
Alkaline Phosphatase: 46 IU/L (ref 39–117)
BUN/Creatinine Ratio: 24 (ref 10–24)
BUN: 57 mg/dL — ABNORMAL HIGH (ref 8–27)
Bilirubin Total: 0.4 mg/dL (ref 0.0–1.2)
CO2: 26 mmol/L (ref 20–29)
Calcium: 9.8 mg/dL (ref 8.6–10.2)
Chloride: 91 mmol/L — ABNORMAL LOW (ref 96–106)
Creatinine, Ser: 2.42 mg/dL — ABNORMAL HIGH (ref 0.76–1.27)
GFR calc Af Amer: 28 mL/min/{1.73_m2} — ABNORMAL LOW (ref >59)
GFR calc non Af Amer: 24 mL/min/{1.73_m2} — ABNORMAL LOW (ref >59)
Globulin, Total: 2.5 g/dL (ref 1.5–4.5)
Glucose: 63 mg/dL — ABNORMAL LOW (ref 65–99)
Potassium: 4.1 mmol/L (ref 3.5–5.2)
Sodium: 143 mmol/L (ref 134–144)
Total Protein: 7.4 g/dL (ref 6.0–8.5)

## 2018-06-11 LAB — CBC
Hematocrit: 40.9 % (ref 37.5–51.0)
Hemoglobin: 14.5 g/dL (ref 13.0–17.7)
MCH: 35.9 pg — ABNORMAL HIGH (ref 26.6–33.0)
MCHC: 35.5 g/dL (ref 31.5–35.7)
MCV: 101 fL — ABNORMAL HIGH (ref 79–97)
Platelets: 256 10*3/uL (ref 150–450)
RBC: 4.04 x10E6/uL — ABNORMAL LOW (ref 4.14–5.80)
RDW: 13.8 % (ref 11.6–15.4)
WBC: 11.1 10*3/uL — ABNORMAL HIGH (ref 3.4–10.8)

## 2018-06-13 ENCOUNTER — Telehealth: Payer: Self-pay

## 2018-06-13 NOTE — Telephone Encounter (Signed)
Pt advised.   Thanks,   -Laura  

## 2018-06-13 NOTE — Telephone Encounter (Signed)
His kidney functions go a little worse. Rest of lab are normal. Kidney functions should improve since torsemide was put on hold.

## 2018-06-13 NOTE — Telephone Encounter (Signed)
He needs to schedule a follow up within a week so we check on orthostatic Bps, recheck edema. Be sure to bring his medications with him.

## 2018-06-13 NOTE — Telephone Encounter (Signed)
Patient was told to call today and let you know how he was doing.  He said that his BP is back to normal sitting and standing and that he is feeling better.  He will continue to take the medication the way you have instructed. Call back number if needed [phone]

## 2018-06-13 NOTE — Telephone Encounter (Signed)
Pt advised apt made for 06/22/2018 at 4:20.  He was also checking on his lab work.    Please advise.  Thanks,   -Mickel Baas

## 2018-06-17 ENCOUNTER — Telehealth: Payer: Self-pay | Admitting: Urology

## 2018-06-17 ENCOUNTER — Ambulatory Visit (INDEPENDENT_AMBULATORY_CARE_PROVIDER_SITE_OTHER): Payer: Medicare Other | Admitting: Urology

## 2018-06-17 ENCOUNTER — Encounter: Payer: Self-pay | Admitting: Urology

## 2018-06-17 VITALS — BP 112/57 | HR 81 | Ht 70.0 in | Wt 180.0 lb

## 2018-06-17 DIAGNOSIS — N5201 Erectile dysfunction due to arterial insufficiency: Secondary | ICD-10-CM

## 2018-06-17 NOTE — Telephone Encounter (Signed)
Please advise 

## 2018-06-17 NOTE — Telephone Encounter (Signed)
Pt said he was supposed to call back and let us know how injection did.  He said it didn't do a bit of good and wants to know if he can come back today for another injection.  Please call pt.

## 2018-06-17 NOTE — Progress Notes (Signed)
06/17/2018  8:26 AM   Vincent Mason [birth year] [id number]  Referring provider: Birdie Sons, MD [address] [place], [place] [address]  Chief Complaint  Patient presents with  . Erectile Dysfunction    HPI: Vincent Mason is a 83 y.o. White or Caucasian male that presents today for evaluation and management of erectile dysfunction.   - Significant Organic Risk factors: CAD, HLD, HTN, Anti-HTN meds including beta-blockers, Tobacco History  - Failed PDE-5 inhibitors  - Trimix Teaching today  PMH: Past Medical History:  Diagnosis Date  . Acute encephalopathy 09/03/2015  . Atrial fibrillation and flutter [facility]) 2013   Dr Vincent Mason at [facility]  on [address].   . C. difficile colitis 09/12/2015  . COPD (chronic obstructive pulmonary disease) ([place])   . HCAP (healthcare-associated pneumonia) 09/12/2015  . Hyperlipidemia   . Hypertension   . Kidney disease, chronic, stage III (moderate, EGFR 30-59 ml/min) ([place])    ARF 2013  . OSA (obstructive sleep apnea)    non-compliant with CPAP.   Marland Kitchen Severe sepsis with septic shock ([place]) 09/12/2015    Surgical History: Past Surgical History:  Procedure Laterality Date  . CATARACT EXTRACTION    . CORONARY ARTERY BYPASS GRAFT  10/1997  . history of cervical discectomy  2009   C5-C6  . Hyperplastic colon polyp  02/2002   Sigmoid polyps    Home Medications:  Allergies as of 06/17/2018      Reactions   Decongestant  [oxymetazoline]       Medication List       Accurate as of June 17, 2018  8:26 AM. Always use your most recent med list.        apixaban 5 MG Tabs tablet Commonly known as:  ELIQUIS Take 1 tablet (5 mg total) by mouth 2 (two) times daily.   aspirin 81 MG EC tablet Take 1 tablet (81 mg total) by mouth daily.   digoxin 0.125 MG tablet Commonly known as:  LANOXIN Take 0.125 mg by mouth daily.   divalproex 250 MG DR tablet Commonly known as:  DEPAKOTE Take 1 tablet by mouth 2 (two) times daily.   ferrous  sulfate 325 (65 FE) MG tablet Take 1 tablet (325 mg total) by mouth 2 (two) times daily with a meal.   metolazone 5 MG tablet Commonly known as:  ZAROXOLYN Take 5 mg by mouth daily as needed (swelling).   metoprolol succinate 25 MG 24 hr tablet Commonly known as:  TOPROL-XL Take 0.5 tablets (12.5 mg total) by mouth daily.   oxyCODONE 15 MG immediate release tablet Commonly known as:  ROXICODONE Take 1 tablet (15 mg total) by mouth every 6 (six) hours as needed for pain. May be filled on or after December 14th, 2019   oxyCODONE 15 MG immediate release tablet Commonly known as:  ROXICODONE Take 1 tablet (15 mg total) by mouth every 6 (six) hours as needed for pain.   oxyCODONE 15 MG immediate release tablet Commonly known as:  ROXICODONE Take 1 tablet (15 mg total) by mouth every 6 (six) hours as needed for pain. May fill on or after November 14th, 2019   polyethylene glycol packet Commonly known as:  MIRALAX / GLYCOLAX Take 17 g by mouth daily.   ranitidine 150 MG tablet Commonly known as:  ZANTAC Take 150 mg by mouth 2 (two) times daily.   sildenafil 20 MG tablet Commonly known as:  REVATIO TAKE ONE TABLET 3 TIMES A DAY AS NEEDED  spironolactone 25 MG tablet Commonly known as:  ALDACTONE Take 25 mg daily by mouth.   torsemide 20 MG tablet Commonly known as:  DEMADEX Take 20 mg daily by mouth.   Vitamin D3 25 MCG (1000 UT) Caps Take by mouth daily.       Allergies:  Allergies  Allergen Reactions  . Decongestant  [Oxymetazoline]     Family History: Family History  Problem Relation Age of Onset  . Hypertension Mother   . Hypertension Father     Social History:  reports that he has quit smoking. He has never used smokeless tobacco. He reports that he does not drink alcohol or use drugs.  ROS: UROLOGY Frequent Urination?: No Hard to postpone urination?: No Burning/pain with urination?: No Get up at night to urinate?: No Leakage of urine?: No Urine  stream starts and stops?: No Trouble starting stream?: No Do you have to strain to urinate?: No Blood in urine?: No Urinary tract infection?: No Sexually transmitted disease?: No Injury to kidneys or bladder?: No Painful intercourse?: No Weak stream?: No Erection problems?: Yes Penile pain?: No  Gastrointestinal Nausea?: No Vomiting?: No Indigestion/heartburn?: No Diarrhea?: No Constipation?: No  Constitutional Fever: No Night sweats?: No Weight loss?: No Fatigue?: No  Skin Skin rash/lesions?: No Itching?: No  Eyes Blurred vision?: No Double vision?: No  Ears/Nose/Throat Sore throat?: No Sinus problems?: No  Hematologic/Lymphatic Swollen glands?: No Easy bruising?: No  Cardiovascular Leg swelling?: No Chest pain?: No  Respiratory Cough?: No Shortness of breath?: No  Endocrine Excessive thirst?: No  Musculoskeletal Back pain?: No Joint pain?: No  Neurological Headaches?: No Dizziness?: No  Psychologic Depression?: No Anxiety?: No  Physical Exam: BP (!) 112/57 (BP Location: Left Arm, Patient Position: Sitting, Cuff Size: Large)   Pulse 81   Ht 5' 10"  (1.778 m)   Wt 180 lb (81.6 kg)   BMI 25.83 kg/m   Constitutional:  Alert and oriented, No acute distress.    Intracavernosal injection Patient's left corpus cavernosum is identified.  An area near the base of the penis is cleansed with rubbing alcohol.  Careful to avoid the dorsal vein, 5 mcg of Edex is injected at a 90 degree angle into the left corpus cavernosum near the base of the penis.  Patient experienced a tumescent but not firm erection in 15 minutes.    Assessment & Plan:   1. Erectile Dysfunction  -He was instructed to call back if he achieves a firmer erection with results and duration.  -We will tentatively schedule an appointment next week to continue training and will inject 10 mcg.   Abbie Sons, MD [facility] [address], [place], [place] [address] [phone]  I, [phone] Vincent Mason , am acting as a scribe for Abbie Sons, MD  I, Abbie Sons, MD, have reviewed all documentation for this visit. The documentation on 06/17/18 for the exam, diagnosis, procedures, and orders are all accurate and complete.

## 2018-06-20 NOTE — Telephone Encounter (Signed)
Have him see Larene Beach this week for a second injection/training

## 2018-06-20 NOTE — Telephone Encounter (Signed)
Please schedule with Larene Beach

## 2018-06-21 ENCOUNTER — Encounter: Payer: Self-pay | Admitting: Family Medicine

## 2018-06-21 ENCOUNTER — Ambulatory Visit (INDEPENDENT_AMBULATORY_CARE_PROVIDER_SITE_OTHER): Payer: Medicare Other | Admitting: Family Medicine

## 2018-06-21 VITALS — BP 157/80 | HR 80 | Temp 97.9°F | Wt 196.0 lb

## 2018-06-21 DIAGNOSIS — I959 Hypotension, unspecified: Secondary | ICD-10-CM | POA: Diagnosis not present

## 2018-06-21 DIAGNOSIS — M5136 Other intervertebral disc degeneration, lumbar region: Secondary | ICD-10-CM

## 2018-06-21 DIAGNOSIS — G2581 Restless legs syndrome: Secondary | ICD-10-CM

## 2018-06-21 DIAGNOSIS — M51369 Other intervertebral disc degeneration, lumbar region without mention of lumbar back pain or lower extremity pain: Secondary | ICD-10-CM

## 2018-06-21 MED ORDER — GABAPENTIN 300 MG PO CAPS: 300.0000 mg | ORAL_CAPSULE | Freq: Two times a day (BID) | ORAL | 1 refills | Status: DC

## 2018-06-21 MED ORDER — OXYCODONE HCL 15 MG PO TABS
15.0000 mg | ORAL_TABLET | Freq: Four times a day (QID) | ORAL | 0 refills | Status: DC | PRN
Start: 2018-06-21 — End: 2018-07-22

## 2018-06-21 NOTE — Progress Notes (Signed)
Patient: Vincent Mason Male    DOB: 12-04-1935   83 y.o.   MRN: [id number] Visit Date: 06/21/2018  Today's Provider: Lelon Huh, MD   Chief Complaint  Patient presents with  . Leg Pain    Bilateral started about three days ago.   Subjective:     Leg Pain   The incident occurred 3 to 5 days ago. The injury mechanism is unknown. Pain location: Entire leg, bilateral. Quality: Aching and also feels like "somthing is crawling around in his legs."  Pt states it feels like restless legs but much worse.  The pain is severe. The pain has been constant since onset. He reports no foreign bodies present. Nothing aggravates the symptoms. Treatments tried: Oxycodone at night helps some.  He also tried OCT Leg and Back Pain Relief with no relief in symptoms.    He was seen last week with dizziness and weakness and found to be orthostatic. Has since stopped taking Torsemide. He previously reports that he has already stopped spirolactone prior to his last visit, but he has not brought his medications in to office to verify what he is taking. He also reports last week that he had taken several tablets of an old prescription for RLS prior to onset of dizziness, but he cannot recall which medication that was. In any event, he states that dizziness has resolved since last o.v.   Allergies  Allergen Reactions  . Decongestant  [Oxymetazoline]      Current Outpatient Medications:  .  oxyCODONE (ROXICODONE) 15 MG immediate release tablet, Take 1 tablet (15 mg total) by mouth every 6 (six) hours as needed for pain. May be filled on or after December 14th, 2019, Disp: 120 tablet, Rfl: 0 .  apixaban (ELIQUIS) 5 MG TABS tablet, Take 1 tablet (5 mg total) by mouth 2 (two) times daily. (Patient taking differently: Take 2.5 mg by mouth 2 (two) times daily. ), Disp: 60 tablet, Rfl: 0 .  aspirin EC 81 MG EC tablet, Take 1 tablet (81 mg total) by mouth daily., Disp: 30 tablet, Rfl: 0 .  Cholecalciferol (VITAMIN  D3) 1000 units CAPS, Take by mouth daily., Disp: , Rfl:  .  digoxin (LANOXIN) 0.125 MG tablet, Take 0.125 mg by mouth daily., Disp: , Rfl:  .  divalproex (DEPAKOTE) 250 MG DR tablet, Take 1 tablet by mouth 2 (two) times daily., Disp: , Rfl:  .  ferrous sulfate 325 (65 FE) MG tablet, Take 1 tablet (325 mg total) by mouth 2 (two) times daily with a meal., Disp: 60 tablet, Rfl: 3 .  metolazone (ZAROXOLYN) 5 MG tablet, Take 5 mg by mouth daily as needed (swelling). , Disp: , Rfl:  .  metoprolol succinate (TOPROL-XL) 25 MG 24 hr tablet, Take 0.5 tablets (12.5 mg total) by mouth daily., Disp: 30 tablet, Rfl: 5 .  oxyCODONE (ROXICODONE) 15 MG immediate release tablet, Take 1 tablet (15 mg total) by mouth every 6 (six) hours as needed for pain., Disp: 120 tablet, Rfl: 0 .  oxyCODONE (ROXICODONE) 15 MG immediate release tablet, Take 1 tablet (15 mg total) by mouth every 6 (six) hours as needed for pain. May fill on or after November 14th, 2019, Disp: 120 tablet, Rfl: 0 .  polyethylene glycol (MIRALAX / GLYCOLAX) packet, Take 17 g by mouth daily., Disp: 14 each, Rfl: 0 .  ranitidine (ZANTAC) 150 MG tablet, Take 150 mg by mouth 2 (two) times daily., Disp: , Rfl:  .  sildenafil (REVATIO) 20 MG tablet, TAKE ONE TABLET 3 TIMES A DAY AS NEEDED, Disp: 30 tablet, Rfl: 11 .  spironolactone (ALDACTONE) 25 MG tablet, Take 25 mg daily by mouth., Disp: , Rfl:  .  torsemide (DEMADEX) 20 MG tablet, Take 20 mg daily by mouth., Disp: , Rfl:   Review of Systems  Constitutional: Negative.   Respiratory: Negative.   Cardiovascular: Negative.   Gastrointestinal: Negative.   Musculoskeletal: Positive for gait problem and myalgias. Negative for arthralgias, back pain, joint swelling, neck pain and neck stiffness.  Neurological: Negative for dizziness, light-headedness and headaches.    Social History   Tobacco Use  . Smoking status: Former Research scientist (life sciences)  . Smokeless tobacco: Never Used  . Tobacco comment: Quit in 1990    Substance Use Topics  . Alcohol use: No      Objective:   BP (!) 157/80 (BP Location: Right Arm, Patient Position: Sitting, Cuff Size: Normal)   Pulse 80   Temp 97.9 F (36.6 C) (Oral)   Wt 196 lb (88.9 kg)   BMI 28.12 kg/m  Vitals:   06/21/18 1056  BP: (!) 157/80  Pulse: 80  Temp: 97.9 F (36.6 C)  TempSrc: Oral  Weight: 196 lb (88.9 kg)     Physical Exam   General Appearance:    Alert, cooperative, no distress  Eyes:    PERRL, conjunctiva/corneas clear, EOM's intact       Lungs:     Clear to auscultation bilaterally, respirations unlabored  Heart:    Regular rate and rhythm, 1+ bipedal edema.   Neurologic:   Awake, alert, oriented x 3. No apparent focal neurological           defect.           Assessment & Plan    1. Restless leg syndrome He was previously prescribed ropinirole  And pramipexole which he now states never really worked. He was also previously 100mg  gabapentin which did not help, will try high dose now.  - gabapentin (NEURONTIN) 300 MG capsule; Take 1 capsule (300 mg total) by mouth 2 (two) times daily.  Dispense: 60 capsule; Refill: 1  2. Degenerative disc disease, lumbar Refill.  - oxyCODONE (ROXICODONE) 15 MG immediate release tablet; Take 1 tablet (15 mg total) by mouth every 6 (six) hours as needed for pain.  Dispense: 120 tablet; Refill: 0  3. Hypotension, unspecified hypotension type Resolved since last visit when he was advised to put torsemide on hold. Has minimal edema, so will keep on hold. He is not very clear on which medications he is taking and did not bring them to the office today. He has previously schedule appointment tomorrow which he has been instructed to bring all of his medication to so we can be clear regarding what he is taking.      Lelon Huh, MD  [facility]

## 2018-06-21 NOTE — Patient Instructions (Signed)
.   Please review the attached list of medications and notify my office if there are any errors.   . Please bring all of your medications to every appointment so we can make sure that our medication list is the same as yours.   

## 2018-06-22 ENCOUNTER — Ambulatory Visit (INDEPENDENT_AMBULATORY_CARE_PROVIDER_SITE_OTHER): Payer: Medicare Other | Admitting: Urology

## 2018-06-22 ENCOUNTER — Encounter: Payer: Self-pay | Admitting: Urology

## 2018-06-22 ENCOUNTER — Ambulatory Visit (INDEPENDENT_AMBULATORY_CARE_PROVIDER_SITE_OTHER): Payer: Medicare Other | Admitting: Family Medicine

## 2018-06-22 ENCOUNTER — Encounter: Payer: Self-pay | Admitting: Family Medicine

## 2018-06-22 ENCOUNTER — Telehealth: Payer: Self-pay | Admitting: Urology

## 2018-06-22 VITALS — BP 132/68 | HR 58 | Temp 97.5°F | Resp 18 | Wt 194.0 lb

## 2018-06-22 VITALS — BP 153/75 | HR 112 | Ht 70.0 in | Wt 196.0 lb

## 2018-06-22 DIAGNOSIS — N5201 Erectile dysfunction due to arterial insufficiency: Secondary | ICD-10-CM

## 2018-06-22 DIAGNOSIS — G2581 Restless legs syndrome: Secondary | ICD-10-CM | POA: Insufficient documentation

## 2018-06-22 DIAGNOSIS — M7989 Other specified soft tissue disorders: Secondary | ICD-10-CM

## 2018-06-22 DIAGNOSIS — N183 Chronic kidney disease, stage 3 unspecified: Secondary | ICD-10-CM

## 2018-06-22 DIAGNOSIS — I2510 Atherosclerotic heart disease of native coronary artery without angina pectoris: Secondary | ICD-10-CM | POA: Diagnosis not present

## 2018-06-22 DIAGNOSIS — I272 Pulmonary hypertension, unspecified: Secondary | ICD-10-CM

## 2018-06-22 DIAGNOSIS — N200 Calculus of kidney: Secondary | ICD-10-CM | POA: Diagnosis not present

## 2018-06-22 MED ORDER — TORSEMIDE 20 MG PO TABS: ORAL_TABLET | ORAL | Status: DC

## 2018-06-22 MED ORDER — SPIRONOLACTONE 25 MG PO TABS: 12.5000 mg | ORAL_TABLET | Freq: Every day | ORAL | Status: DC

## 2018-06-22 MED ORDER — ALLOPURINOL 100 MG PO TABS: 200.0000 mg | ORAL_TABLET | Freq: Every day | ORAL | 1 refills | Status: DC

## 2018-06-22 MED ORDER — APIXABAN 5 MG PO TABS: ORAL_TABLET | ORAL | Status: DC

## 2018-06-22 MED ORDER — FERROUS SULFATE 325 (65 FE) MG PO TABS: 325.0000 mg | ORAL_TABLET | Freq: Every day | ORAL | 3 refills | Status: DC

## 2018-06-22 NOTE — Progress Notes (Signed)
06/22/2018 10:53 AM   Vincent Mason 12/20/1935 [id number]  Referring provider: Birdie Sons, MD [address] [place], [place] [address]  Chief Complaint  Patient presents with  . Erectile Dysfunction    HPI: Patient presents for intracavernosal injection training.  He received 5 mcg of prostaglandin last week and had a tumescent erection which lasted less than 30 minutes.   PMH: Past Medical History:  Diagnosis Date  . Acute encephalopathy 09/03/2015  . Atrial fibrillation and flutter [facility]) 2013   Dr Nehemiah Massed at [facility]  on [address].   . C. difficile colitis 09/12/2015  . COPD (chronic obstructive pulmonary disease) ([place])   . HCAP (healthcare-associated pneumonia) 09/12/2015  . Hyperlipidemia   . Hypertension   . Kidney disease, chronic, stage III (moderate, EGFR 30-59 ml/min) ([place])    ARF 2013  . OSA (obstructive sleep apnea)    non-compliant with CPAP.   Marland Kitchen Severe sepsis with septic shock ([place]) 09/12/2015    Surgical History: Past Surgical History:  Procedure Laterality Date  . CATARACT EXTRACTION    . CORONARY ARTERY BYPASS GRAFT  10/1997  . history of cervical discectomy  2009   C5-C6  . Hyperplastic colon polyp  02/2002   Sigmoid polyps    Home Medications:  Allergies as of 06/22/2018      Reactions   Decongestant  [oxymetazoline]       Medication List       Accurate as of June 22, 2018 10:53 AM. Always use your most recent med list.        apixaban 5 MG Tabs tablet Commonly known as:  ELIQUIS Take 1 tablet (5 mg total) by mouth 2 (two) times daily.   aspirin 81 MG EC tablet Take 1 tablet (81 mg total) by mouth daily.   digoxin 0.125 MG tablet Commonly known as:  LANOXIN Take 0.125 mg by mouth daily.   divalproex 250 MG DR tablet Commonly known as:  DEPAKOTE Take 1 tablet by mouth 2 (two) times daily.   ferrous sulfate 325 (65 FE) MG tablet Take 1 tablet (325 mg total) by mouth 2 (two) times daily with a meal.   gabapentin  300 MG capsule Commonly known as:  NEURONTIN Take 1 capsule (300 mg total) by mouth 2 (two) times daily.   metolazone 5 MG tablet Commonly known as:  ZAROXOLYN Take 5 mg by mouth daily as needed (swelling).   metoprolol succinate 25 MG 24 hr tablet Commonly known as:  TOPROL-XL Take 0.5 tablets (12.5 mg total) by mouth daily.   oxyCODONE 15 MG immediate release tablet Commonly known as:  ROXICODONE Take 1 tablet (15 mg total) by mouth every 6 (six) hours as needed for pain.   polyethylene glycol packet Commonly known as:  MIRALAX / GLYCOLAX Take 17 g by mouth daily.   ranitidine 150 MG tablet Commonly known as:  ZANTAC Take 150 mg by mouth 2 (two) times daily.   sildenafil 20 MG tablet Commonly known as:  REVATIO TAKE ONE TABLET 3 TIMES A DAY AS NEEDED   spironolactone 25 MG tablet Commonly known as:  ALDACTONE Take 25 mg daily by mouth.   torsemide 20 MG tablet Commonly known as:  DEMADEX Take 20 mg daily by mouth.   Vitamin D3 25 MCG (1000 UT) Caps Take by mouth daily.       Allergies:  Allergies  Allergen Reactions  . Decongestant  [Oxymetazoline]     Family History: Family History  Problem  Relation Age of Onset  . Hypertension Mother   . Hypertension Father     Social History:  reports that he has quit smoking. He has never used smokeless tobacco. He reports that he does not drink alcohol or use drugs.  ROS: UROLOGY Frequent Urination?: No Hard to postpone urination?: No Burning/pain with urination?: No Get up at night to urinate?: No Leakage of urine?: No Urine stream starts and stops?: No Trouble starting stream?: No Do you have to strain to urinate?: No Blood in urine?: No Urinary tract infection?: No Sexually transmitted disease?: No Injury to kidneys or bladder?: No Painful intercourse?: No Weak stream?: No Erection problems?: Yes Penile pain?: No  Gastrointestinal Nausea?: No Vomiting?: No Indigestion/heartburn?: No Diarrhea?:  No Constipation?: No  Constitutional Fever: No Night sweats?: No Weight loss?: No Fatigue?: No  Skin Skin rash/lesions?: No Itching?: No  Eyes Blurred vision?: No Double vision?: No  Ears/Nose/Throat Sore throat?: No Sinus problems?: No  Hematologic/Lymphatic Swollen glands?: No Easy bruising?: No  Cardiovascular Leg swelling?: No Chest pain?: No  Respiratory Cough?: No Shortness of breath?: No  Endocrine Excessive thirst?: No  Musculoskeletal Back pain?: No Joint pain?: No  Neurological Headaches?: No Dizziness?: Yes  Psychologic Depression?: No Anxiety?: No  Physical Exam: BP (!) 153/75 (BP Location: Left Arm, Patient Position: Sitting, Cuff Size: Normal)   Pulse (!) 112   Ht _0  (1.778 m)   Wt 196 lb (88.9 kg)   BMI 28.12 kg/m   Constitutional:  Alert and oriented, No acute distress. HEENT: [place] AT, moist mucus membranes.  Trachea midline, no masses. Cardiovascular: No clubbing, cyanosis, or edema. Respiratory: Normal respiratory effort, no increased work of breathing. GI: Abdomen is soft, nontender, nondistended, no abdominal masses GU: No CVA tenderness Lymph: No cervical or inguinal lymphadenopathy. Skin: No rashes, bruises or suspicious lesions. Neurologic: Grossly intact, no focal deficits, moving all 4 extremities. Psychiatric: Normal mood and affect.  I again reviewed anatomic landmarks and he was injected with 10 mcg of Edex in the right corpus cavernosum.  He was instructed to call back regarding rigidity and duration of the erection.    Assessment & Plan:   He would like to return later this week to do the injection himself and the dose can be titrated by telephone or my chart.   Abbie Sons, [place] [address] [place],  [address] [phone]

## 2018-06-22 NOTE — Telephone Encounter (Signed)
Pt called and wants to let Dr Bernardo Heater know that "the shot didn't do any good"  He wants to know if someone will call him and let him know if he can " come back and get a stronger one."

## 2018-06-22 NOTE — Progress Notes (Signed)
Patient: Vincent Mason Male    DOB: [birth year]   83 y.o.   MRN: [id number] Visit Date: 06/22/2018  Today's Provider: Lelon Huh, MD   Chief Complaint  Patient presents with  . Follow-up   Subjective:     HPI  Follow up for Hypotension:  The patient was last seen for this 1 days ago. Changes made at last visit include none. Patient was advised to keep Torsemide on hold for now. Patient was advised to follow up in one day and to bring all medications with him.  He reports good compliance with treatment. He feels that condition is stable. He is not having side effects. Patient states he started having dizziness this morning after taking a second dose of Gabapentin.   He is also here to review his medications as there has been a lot of confusion about which medications he has actually been taking.   He is still taking generic Uloric which he doesn't know what it is for.  He stopped taking spironolactone a few weeks ago, unclear for what reason. He was very hypotensive at visit last week, but now states he was already off of spironolactone at that visit.  He has now been off of torsemide for a week due to hypotension and dizziness and has had very little swelling since stopping this diuretic.   He had previously been on sildenafil for pulmonary hypertension, but has been out of this for awhile, no sure how long.   He has only been taking 1/2 tablet Eliquis twice a day due to epistaxis on full 5mg  tablet daily. He is taking 81mg  ECASA with this without side effects.   ------------------------------------------------------------------------------------ He was seen yesterday and prescribed gabapentin for RLS. He took first dose last night and states it worked really well for taking the pain in his legs away, but has felt dizzy after taking it this morning.   Allergies  Allergen Reactions  . Decongestant  [Oxymetazoline]      Current Outpatient Medications:  .  apixaban  (ELIQUIS) 5 MG TABS tablet, Take 1 tablet (5 mg total) by mouth 2 (two) times daily. (Patient taking differently: Take 2.5 mg by mouth 2 (two) times daily. ), Disp: 60 tablet, Rfl: 0 .  aspirin EC 81 MG EC tablet, Take 1 tablet (81 mg total) by mouth daily., Disp: 30 tablet, Rfl: 0 .  Cholecalciferol (VITAMIN D3) 1000 units CAPS, Take by mouth daily., Disp: , Rfl:  .  digoxin (LANOXIN) 0.125 MG tablet, Take 0.125 mg by mouth daily., Disp: , Rfl:  .  divalproex (DEPAKOTE) 250 MG DR tablet, Take 1 tablet by mouth 2 (two) times daily., Disp: , Rfl:  .  ferrous sulfate 325 (65 FE) MG tablet, Take 1 tablet (325 mg total) by mouth 2 (two) times daily with a meal., Disp: 60 tablet, Rfl: 3 .  gabapentin (NEURONTIN) 300 MG capsule, Take 1 capsule (300 mg total) by mouth 2 (two) times daily., Disp: 60 capsule, Rfl: 1 .  metolazone (ZAROXOLYN) 5 MG tablet, Take 5 mg by mouth daily as needed (swelling). , Disp: , Rfl: ((NOT TAKING) .  metoprolol succinate (TOPROL-XL) 25 MG 24 hr tablet, Take 0.5 tablets (12.5 mg total) by mouth daily., Disp: 30 tablet, Rfl: 5 .  oxyCODONE (ROXICODONE) 15 MG immediate release tablet, Take 1 tablet (15 mg total) by mouth every 6 (six) hours as needed for pain., Disp: 120 tablet, Rfl: 0 .  polyethylene glycol (MIRALAX /  GLYCOLAX) packet, Take 17 g by mouth daily., Disp: 14 each, Rfl: 0 .  ranitidine (ZANTAC) 150 MG tablet, Take 150 mg by mouth 2 (two) times daily., Disp: , Rfl:  .  sildenafil (REVATIO) 20 MG tablet, TAKE ONE TABLET 3 TIMES A DAY AS NEEDED, Disp: 30 tablet, Rfl: 11 (NOT TAKING) .  spironolactone (ALDACTONE) 25 MG tablet, Take 25 mg daily by mouth., Disp: , Rfl: (NOT TAKING .  torsemide (DEMADEX) 20 MG tablet, Take 20 mg daily by mouth., Disp: , Rfl: (NOT TAKING)   febuxostat 80mg  daily  levetiracetam 500mg  bid  Review of Systems  Constitutional: Negative for appetite change, chills and fever.  Respiratory: Negative for chest tightness, shortness of breath and  wheezing.   Cardiovascular: Negative for chest pain and palpitations.  Gastrointestinal: Negative for abdominal pain, nausea and vomiting.  Neurological: Positive for dizziness.    Social History   Tobacco Use  . Smoking status: Former Research scientist (life sciences)  . Smokeless tobacco: Never Used  . Tobacco comment: Quit in 1990  Substance Use Topics  . Alcohol use: No      Objective:   BP 132/68 (BP Location: Left Arm, Patient Position: Sitting, Cuff Size: Large)   Pulse (!) 58   Temp (!) 97.5 F (36.4 C) (Oral)   Resp 18   Wt 194 lb (88 kg)   SpO2 99% Comment: room air  BMI 27.84 kg/m  Vitals:   06/22/18 1638  BP: 132/68  Pulse: (!) 58  Resp: 18  Temp: (!) 97.5 F (36.4 C)  TempSrc: Oral  SpO2: 99%  Weight: 194 lb (88 kg)     Physical Exam  General appearance: alert, well developed, well nourished, cooperative and in no distress Head: Normocephalic, without obvious abnormality, atraumatic Respiratory: Respirations even and unlabored, normal respiratory rate Extremities: 2+ bipedal edema.      Assessment & Plan    1. Chronic kidney disease (CKD), stage III (moderate) (HCC) Relatively stable.   2. Calculus of kidney Change uloric to allopurinol 200mg  a day due to cardiac risk associated with Uloric.   3. Coronary artery disease involving native coronary artery of native heart without angina pectoris Asymptomatic. Compliant with medication.  Continue aggressive risk factor modification.    4. Restless leg syndrome Started gabapentin last night and very effective, but is causing some dizziness. Advised to only take at bedtime for the next 4 days, then increase to BID  5. Leg swelling Now off of torsemide and fairly well controlled. Advised may take torsemide only prn when leg swells more than usual. Restart spironolactone at 1/2 tablet daily for now.   6. Hypertensive pulmonary vascular disease ([place]) Currently off of sildenafil for the last few months. Considering recent  trouble with dizziness and orthostatic hypotension, will stay off of this for now.      Lelon Huh, MD  [facility]

## 2018-06-22 NOTE — Patient Instructions (Addendum)
.   Please review the attached list of medications and notify my office if there are any errors.   . Please bring all of your medications to every appointment so we can make sure that our medication list is the same as yours.    Only take gabapentin at night for the next 4 days, then go up to one in the morning and one at night   STOP taking Febuxostat (Uloric), it can cause heart problems. Take allupurinol instead. I've sent a new prescription for this to your pharmacy.    Start back on spironolactone, but only take 1/2 tablet every day

## 2018-06-23 ENCOUNTER — Ambulatory Visit (INDEPENDENT_AMBULATORY_CARE_PROVIDER_SITE_OTHER): Payer: Medicare Other | Admitting: Urology

## 2018-06-23 ENCOUNTER — Encounter: Payer: Self-pay | Admitting: Urology

## 2018-06-23 VITALS — BP 129/67 | HR 114 | Ht 70.0 in | Wt 193.3 lb

## 2018-06-23 DIAGNOSIS — N5201 Erectile dysfunction due to arterial insufficiency: Secondary | ICD-10-CM | POA: Diagnosis not present

## 2018-06-23 MED ORDER — NONFORMULARY OR COMPOUNDED ITEM: 0 refills | Status: DC

## 2018-06-23 NOTE — Progress Notes (Signed)
06/23/2018  12:19 PM   CASMIR AUGUSTE 1935-06-02 [id number]  Referring provider: Birdie Sons, [place], [place] [address]  Chief Complaint  Patient presents with  . Erectile Dysfunction    HPI: Vincent Mason is a 83 yo M who presents today for a Edex injection for his ED. He presented on 06/22/2018 for intercavernosal injection training where he received 10 mcg of prostaglandin last week and had a tumescent erection. He reports that the injection was not effective. He received 5 mcg of prostaglandin last week and had a tumescent erection which lasted less than 30 minutes.   PMH: Past Medical History:  Diagnosis Date  . Acute encephalopathy 09/03/2015  . Atrial fibrillation and flutter [facility]) 2013   Dr Nehemiah Massed at [facility]  on [address].   . C. difficile colitis 09/12/2015  . COPD (chronic obstructive pulmonary disease) ([place])   . HCAP (healthcare-associated pneumonia) 09/12/2015  . Hyperlipidemia   . Hypertension   . Kidney disease, chronic, stage III (moderate, EGFR 30-59 ml/min) ([place])    ARF 2013  . OSA (obstructive sleep apnea)    non-compliant with CPAP.   Marland Kitchen Severe sepsis with septic shock ([place]) 09/12/2015    Surgical History: Past Surgical History:  Procedure Laterality Date  . CATARACT EXTRACTION    . CORONARY ARTERY BYPASS GRAFT  10/1997  . history of cervical discectomy  2009   C5-C6  . Hyperplastic colon polyp  02/2002   Sigmoid polyps    Home Medications:  Allergies as of 06/23/2018      Reactions   Decongestant  [oxymetazoline]       Medication List       Accurate as of June 23, 2018 12:19 PM. Always use your most recent med list.        ALIVE MENS ENERGY Tabs Take 1 tablet by mouth daily.   allopurinol 100 MG tablet Commonly known as:  ZYLOPRIM Take 2 tablets (200 mg total) by mouth daily. TAKE IN PLACE OF FEBUXOSTAT   apixaban 5 MG Tabs tablet Commonly known as:  ELIQUIS Take 1/2 tablet twice a day   aspirin EC 81  MG tablet Take 81 mg by mouth daily.   digoxin 0.125 MG tablet Commonly known as:  LANOXIN Take 0.125 mg by mouth daily.   divalproex 250 MG DR tablet Commonly known as:  DEPAKOTE Take 1 tablet by mouth 2 (two) times daily.   ferrous sulfate 325 (65 FE) MG tablet Take 1 tablet (325 mg total) by mouth daily.   gabapentin 300 MG capsule Commonly known as:  NEURONTIN Take 1 capsule (300 mg total) by mouth 2 (two) times daily.   levETIRAcetam 500 MG tablet Commonly known as:  KEPPRA Take 500 mg by mouth 2 (two) times daily.   metoprolol succinate 25 MG 24 hr tablet Commonly known as:  TOPROL-XL Take 0.5 tablets (12.5 mg total) by mouth daily.   NONFORMULARY OR COMPOUNDED ITEM Trimix (30/1/10)-(Pap/Phent/PGE) Dosage: Inject 1.0 cc per injection 3cc size vial Qty 1 Refills [place] [phone] Fax [phone]   oxyCODONE 15 MG immediate release tablet Commonly known as:  ROXICODONE Take 1 tablet (15 mg total) by mouth every 6 (six) hours as needed for pain.   polyethylene glycol packet Commonly known as:  MIRALAX / GLYCOLAX Take 17 g by mouth daily.   ranitidine 150 MG tablet Commonly known as:  ZANTAC Take 150 mg by mouth 2 (two) times daily.   sildenafil  20 MG tablet Commonly known as:  REVATIO TAKE ONE TABLET 3 TIMES A DAY AS NEEDED   spironolactone 25 MG tablet Commonly known as:  ALDACTONE Take 0.5 tablets (12.5 mg total) by mouth daily.   torsemide 20 MG tablet Commonly known as:  DEMADEX Take one daily as needed for excessive swelling   Vitamin D3 25 MCG (1000 UT) Caps Take by mouth daily.       Allergies:  Allergies  Allergen Reactions  . Decongestant  [Oxymetazoline]     Family History: Family History  Problem Relation Age of Onset  . Hypertension Mother   . Hypertension Father     Social History:  reports that he has quit smoking. He has never used smokeless tobacco. He reports that he does not drink alcohol or use  drugs.  ROS: UROLOGY Frequent Urination?: No Hard to postpone urination?: No Burning/pain with urination?: No Get up at night to urinate?: No Leakage of urine?: No Urine stream starts and stops?: No Trouble starting stream?: No Do you have to strain to urinate?: No Blood in urine?: No Urinary tract infection?: No Sexually transmitted disease?: No Injury to kidneys or bladder?: No Painful intercourse?: No Weak stream?: No Erection problems?: Yes Penile pain?: No  Gastrointestinal Nausea?: No Vomiting?: No Indigestion/heartburn?: No Diarrhea?: No Constipation?: No  Constitutional Fever: No Night sweats?: No Weight loss?: No Fatigue?: No  Skin Skin rash/lesions?: No Itching?: No  Eyes Blurred vision?: No Double vision?: No  Ears/Nose/Throat Sore throat?: No Sinus problems?: No  Hematologic/Lymphatic Swollen glands?: No Easy bruising?: No  Cardiovascular Leg swelling?: No Chest pain?: No  Respiratory Cough?: No Shortness of breath?: No  Endocrine Excessive thirst?: No  Musculoskeletal Back pain?: No Joint pain?: No  Neurological Headaches?: No Dizziness?: Yes  Psychologic Depression?: No Anxiety?: No  Physical Exam: BP 129/67 (BP Location: Left Arm, Patient Position: Sitting, Cuff Size: Normal)   Pulse (!) 114   Ht 5' 10"  (1.778 m)   Wt 193 lb 4.8 oz (87.7 kg)   BMI 27.74 kg/m   Constitutional:  Well nourished. Alert and oriented, No acute distress. GU: No CVA tenderness.  No bladder fullness or masses.  Patient with circumcised, normal phallus. Urethral meatus is patent.  No penile discharge. No penile lesions or rashes.  Skin: No rashes, bruises or suspicious lesions. Neurologic: Grossly intact, no focal deficits, moving all 4 extremities. Psychiatric: Normal mood and affect.  Assessment & Plan:    1. ED -Injected 10 out of 20 mcg potency of Edex lot# [id number] in the right corpus cavernosum on 1/292020; Not effective  -Discussed  referal for penile prosthesis placement or penile low-intensity shockwave therapy; pt not interested in treatment  -Pt interested in Trimix; Rx sent to custom care clinic in [place]; return for Trimix teaching and injection   Return for schedule trimix titration .  Zara Council, PA-C  [facility] [address] [place], [place] [address] [phone]  I, Lucas Mallow, am acting as a Education administrator for [employer],  I have reviewed the above documentation for accuracy and completeness, and I agree with the above.    Zara Council, PA-C

## 2018-06-24 ENCOUNTER — Encounter: Payer: Self-pay | Admitting: Urology

## 2018-06-30 ENCOUNTER — Ambulatory Visit (INDEPENDENT_AMBULATORY_CARE_PROVIDER_SITE_OTHER): Payer: Medicare Other | Admitting: Urology

## 2018-06-30 ENCOUNTER — Encounter: Payer: Self-pay | Admitting: Urology

## 2018-06-30 VITALS — BP 108/69 | HR 89 | Ht 70.0 in | Wt 180.0 lb

## 2018-06-30 DIAGNOSIS — N5201 Erectile dysfunction due to arterial insufficiency: Secondary | ICD-10-CM

## 2018-06-30 NOTE — Progress Notes (Signed)
06/30/2018  8:44 AM   Marigene Ehlers [birth year] [id number]  Referring provider: Birdie Sons, [place],  [address]  Chief Complaint  Patient presents with  . Erectile Dysfunction    trimix injection    HPI: Vincent Mason is a 83 y.o. White or Caucasian male that presents today for a TriMix Injection for his Erectile Dysfunction.  He originally presented on 05/04/2018, with Dr. Bernardo Heater, with a 73-monthhistory of difficulty achieving and maintaining erection. Previous trials with Viagra and generic sildenafil ended without improvement.  On 06/17/2018, he presented for ICI training and received 5 mcg of prostaglandin establishing a tumescent erection. At his follow up visit (06/22/18), he reported his erection was unsatisfactory and was injected with 10 mcg to no avail.  On 06/23/2018, he returned and received 10/20 mcg Edex, which he also found ineffective. He was offered penile prosthesis placement but presented a preference towards proceeding with Trimix.  Today, he returns for a Trimix trial, per his request. The patient remains adamantly against penile prosthesis placement and would like "other options" to successful achieve and maintain erection.   PMH: Past Medical History:  Diagnosis Date  . Acute encephalopathy 09/03/2015  . Atrial fibrillation and flutter ([facility] 2013   [facility] [place]   . C. difficile colitis 09/12/2015  . COPD (chronic obstructive pulmonary disease) ([facility]   . HCAP (healthcare-associated pneumonia) 09/12/2015  . Hyperlipidemia   . Hypertension   . Kidney disease, chronic, stage III (moderate, EGFR 30-59 ml/min) ([facility]    ARF 2013  . OSA (obstructive sleep apnea)    non-compliant with CPAP.   .Marland KitchenSevere sepsis with septic shock ([facility] 09/12/2015    Surgical History: Past Surgical History:  Procedure Laterality Date  . CATARACT EXTRACTION    . CORONARY ARTERY BYPASS GRAFT  10/1997  . history of cervical  discectomy  2009   C5-C6  . Hyperplastic colon polyp  02/2002   Sigmoid polyps    Home Medications:  Allergies as of 06/30/2018      Reactions   Decongestant  [oxymetazoline]       Medication List       Accurate as of June 30, 2018  8:44 AM. Always use your most recent med list.        ALIVE MENS ENERGY Tabs Take 1 tablet by mouth daily.   allopurinol 100 MG tablet Commonly known as:  ZYLOPRIM Take 2 tablets (200 mg total) by mouth daily. TAKE IN PLACE OF FEBUXOSTAT   apixaban 5 MG Tabs tablet Commonly known as:  ELIQUIS Take 1/2 tablet twice a day   aspirin EC 81 MG tablet Take 81 mg by mouth daily.   digoxin 0.125 MG tablet Commonly known as:  LANOXIN Take 0.125 mg by mouth daily.   divalproex 250 MG DR tablet Commonly known as:  DEPAKOTE Take 1 tablet by mouth 2 (two) times daily.   ferrous sulfate 325 (65 FE) MG tablet Take 1 tablet (325 mg total) by mouth daily.   gabapentin 300 MG capsule Commonly known as:  NEURONTIN Take 1 capsule (300 mg total) by mouth 2 (two) times daily.   levETIRAcetam 500 MG tablet Commonly known as:  KEPPRA Take 500 mg by mouth 2 (two) times daily.   metoprolol succinate 25 MG 24 hr tablet Commonly known as:  TOPROL-XL Take 0.5 tablets (12.5 mg total) by mouth daily.   NONFORMULARY OR COMPOUNDED ITEM Trimix (30/1/10)-(Pap/Phent/PGE) Dosage: Inject  1.0 cc per injection 3cc size vial Qty 1 Refills [place] [phone] Fax [phone]   oxyCODONE 15 MG immediate release tablet Commonly known as:  ROXICODONE Take 1 tablet (15 mg total) by mouth every 6 (six) hours as needed for pain.   polyethylene glycol packet Commonly known as:  MIRALAX / GLYCOLAX Take 17 g by mouth daily.   ranitidine 150 MG tablet Commonly known as:  ZANTAC Take 150 mg by mouth 2 (two) times daily.   sildenafil 20 MG tablet Commonly known as:  REVATIO TAKE ONE TABLET 3 TIMES A DAY AS NEEDED   spironolactone 25 MG  tablet Commonly known as:  ALDACTONE Take 0.5 tablets (12.5 mg total) by mouth daily.   torsemide 20 MG tablet Commonly known as:  DEMADEX Take one daily as needed for excessive swelling   Vitamin D3 25 MCG (1000 UT) Caps Take by mouth daily.       Allergies:  Allergies  Allergen Reactions  . Decongestant  [Oxymetazoline]     Family History: Family History  Problem Relation Age of Onset  . Hypertension Mother   . Hypertension Father     Social History:  reports that he has quit smoking. He has never used smokeless tobacco. He reports that he does not drink alcohol or use drugs.  ROS: UROLOGY Frequent Urination?: No Hard to postpone urination?: No Burning/pain with urination?: No Get up at night to urinate?: No Leakage of urine?: No Urine stream starts and stops?: No Trouble starting stream?: No Do you have to strain to urinate?: No Blood in urine?: No Urinary tract infection?: No Sexually transmitted disease?: No Injury to kidneys or bladder?: No Painful intercourse?: No Weak stream?: No Erection problems?: Yes Penile pain?: No  Gastrointestinal Nausea?: No Vomiting?: No Indigestion/heartburn?: No Diarrhea?: No Constipation?: No  Constitutional Fever: No Night sweats?: No Weight loss?: No Fatigue?: No  Skin Skin rash/lesions?: No Itching?: No  Eyes Blurred vision?: No Double vision?: No  Ears/Nose/Throat Sore throat?: No Sinus problems?: No  Hematologic/Lymphatic Swollen glands?: No Easy bruising?: No  Cardiovascular Leg swelling?: No Chest pain?: No  Respiratory Cough?: No Shortness of breath?: No  Endocrine Excessive thirst?: No  Musculoskeletal Back pain?: No Joint pain?: No  Neurological Headaches?: No Dizziness?: No  Psychologic Depression?: No Anxiety?: No  Physical Exam: BP 108/69 (BP Location: Left Arm, Patient Position: Sitting)   Pulse 89   Ht 5' 10"  (1.778 m)   Wt 180 lb (81.6 kg) Comment: per pt  BMI  25.83 kg/m   Constitutional:  Alert and oriented, No acute distress. Respiratory: Normal respiratory effort, no increased work of breathing. Head: Normocephalic and Atraumatic. GU: No CVA tenderness Skin: No rashes, bruises or suspicious lesions. Neurologic: Grossly intact, no focal deficits, moving all 4 extremities. Psychiatric: Normal mood and affect.  Procedure  Patient's left corpus cavernosum is identified.  An area near the base of the penis is cleansed with rubbing alcohol.  Careful to avoid the dorsal vein, 6 mcg of Trimix, Lot # [id number]  exp # 08/06/2018 is injected at a 90 degree angle into the right corpus cavernosum near the base of the penis.    Patient experienced a semi-firm erection in 15 minutes.    Assessment & Plan:   1. Erectile Dysfunction  - Patient has previously attempted the following medical interventions and found them ineffective for treatment: Viagra, sildenafil, prostaglandin (up to 10 mcg), and Edex (10 mcg)  - We had a frank discussion about reasonable  options moving forward including penile prosthesis and vacuum pump, as well as, physiological reasons for failed ICI trials given his age.  - Patient reports comfortability with injecting Trimix himself at home. I advised the patient not to increase his injection amount by more than 1 mcg per injection, as well as, not to inject again until Monday morning to allow for in-office treatment/management in the case of priapism.  - The patient was advised not to inject more than every other day.  - Advised patient of the condition of priapism, painful erection lasting for more than four hours, and to contact the office immediately or seek treatment in the ED  Return for patient to call for follow up .  Zara Council, PA-C  [facility] [address] [place], [place] [address] [phone]  I, Temidayo Atanda-[place] , am acting as a scribe for [facility], PA-C  I  have reviewed the above documentation for accuracy and completeness, and I agree with the above.    Zara Council, PA-C  I spent 10 with this patient in a face to face visit of which greater than 50% was spent in counseling and coordination of care with the patient concerning his ED.

## 2018-07-04 ENCOUNTER — Encounter: Payer: Self-pay | Admitting: Urology

## 2018-07-04 ENCOUNTER — Telehealth: Payer: Self-pay | Admitting: Urology

## 2018-07-04 ENCOUNTER — Ambulatory Visit (INDEPENDENT_AMBULATORY_CARE_PROVIDER_SITE_OTHER): Payer: Medicare Other | Admitting: Urology

## 2018-07-04 VITALS — BP 119/63 | HR 114 | Ht 70.0 in | Wt 180.0 lb

## 2018-07-04 DIAGNOSIS — N529 Male erectile dysfunction, unspecified: Secondary | ICD-10-CM

## 2018-07-04 NOTE — Progress Notes (Signed)
07/04/2018 11:28 AM   Vincent Mason [birth year] [id number]  Referring provider: Birdie Sons, MD [address] [place], [place] [address]  Chief Complaint  Patient presents with  . Penis Pain    HPI: Vincent Mason is a 83 y.o. male with erectile dysfunction who presents today for complications subsequent self-injection of TriMix.    Patient reports that he had injected himself this morning with 0.8 mL of Trimix at the right penile base but developed blistering in the distal penile shaft.  According to the patient, the blister was larger earlier and has reduced.  Patient has some erethyma and will need to wait until it has healed to try injecting himself again.  This was the first time the patient has injected himself since injection training.  PMH: Past Medical History:  Diagnosis Date  . Acute encephalopathy 09/03/2015  . Atrial fibrillation and flutter [facility]) 2013   Dr [facility]hemiah Massed at [facility]  on [address].   . C. difficile colitis 09/12/2015  . COPD (chronic obstructive pulmonary disease) ([place])   . HCAP (healthcare-associated pneumonia) 09/12/2015  . Hyperlipidemia   . Hypertension   . Kidney disease, chronic, stage III (moderate, EGFR 30-59 ml/min) ([place])    ARF 2013  . OSA (obstructive sleep apnea)    non-compliant with CPAP.   Marland Kitchen Severe sepsis with septic shock ([place]) 09/12/2015    Surgical History: Past Surgical History:  Procedure Laterality Date  . CATARACT EXTRACTION    . CORONARY ARTERY BYPASS GRAFT  10/1997  . history of cervical discectomy  2009   C5-C6  . Hyperplastic colon polyp  02/2002   Sigmoid polyps    Home Medications:  Allergies as of 07/04/2018      Reactions   Decongestant  [oxymetazoline]       Medication List       Accurate as of July 04, 2018 11:28 AM. Always use your most recent med list.        ALIVE MENS ENERGY Tabs Take 1 tablet by mouth daily.   allopurinol 100 MG tablet Commonly known as:  ZYLOPRIM Take 2 tablets (200  mg total) by mouth daily. TAKE IN PLACE OF FEBUXOSTAT   apixaban 5 MG Tabs tablet Commonly known as:  ELIQUIS Take 1/2 tablet twice a day   aspirin EC 81 MG tablet Take 81 mg by mouth daily.   digoxin 0.125 MG tablet Commonly known as:  LANOXIN Take 0.125 mg by mouth daily.   divalproex 250 MG DR tablet Commonly known as:  DEPAKOTE Take 1 tablet by mouth 2 (two) times daily.   ferrous sulfate 325 (65 FE) MG tablet Take 1 tablet (325 mg total) by mouth daily.   gabapentin 300 MG capsule Commonly known as:  NEURONTIN Take 1 capsule (300 mg total) by mouth 2 (two) times daily.   levETIRAcetam 500 MG tablet Commonly known as:  KEPPRA Take 500 mg by mouth 2 (two) times daily.   metoprolol succinate 25 MG 24 hr tablet Commonly known as:  TOPROL-XL Take 0.5 tablets (12.5 mg total) by mouth daily.   NONFORMULARY OR COMPOUNDED ITEM Trimix (30/1/10)-(Pap/Phent/PGE) Dosage: Inject 1.0 cc per injection 3cc size vial Qty 1 Refills [place] [phone] Fax [phone]   oxyCODONE 15 MG immediate release tablet Commonly known as:  ROXICODONE Take 1 tablet (15 mg total) by mouth every 6 (six) hours as needed for pain.   polyethylene glycol packet Commonly known as:  MIRALAX / GLYCOLAX Take 17  g by mouth daily.   ranitidine 150 MG tablet Commonly known as:  ZANTAC Take 150 mg by mouth 2 (two) times daily.   sildenafil 20 MG tablet Commonly known as:  REVATIO TAKE ONE TABLET 3 TIMES A DAY AS NEEDED   spironolactone 25 MG tablet Commonly known as:  ALDACTONE Take 0.5 tablets (12.5 mg total) by mouth daily.   torsemide 20 MG tablet Commonly known as:  DEMADEX Take one daily as needed for excessive swelling   Vitamin D3 25 MCG (1000 UT) Caps Take by mouth daily.       Allergies:  Allergies  Allergen Reactions  . Decongestant  [Oxymetazoline]     Family History: Family History  Problem Relation Age of Onset  . Hypertension Mother   .  Hypertension Father     Social History:  reports that he has quit smoking. He has never used smokeless tobacco. He reports that he does not drink alcohol or use drugs.  ROS: No significant change from 06/30/2018 except as noted in the HPI  Physical Exam: BP 119/63 (BP Location: Left Arm, Patient Position: Sitting, Cuff Size: Normal)   Pulse (!) 114   Ht 5' 10"  (1.778 m)   Wt 180 lb (81.6 kg)   BMI 25.83 kg/m   Constitutional:  Well nourished. Alert and oriented, No acute distress. Cardiovascular: No clubbing, cyanosis, or edema. Respiratory: Normal respiratory effort, no increased work of breathing. GU: Patient with uncircumcised phallus.  About 5 mm area of depigmentation distal prepuce. Urethral meatus is patent.  No penile discharge. Skin: No rashes, bruises or suspicious lesions. Neurologic: Grossly intact, no focal deficits, moving all 4 extremities. Psychiatric: Normal mood and affect.  Assessment & Plan:   Findings consistent with a superficial in injection.  Although he states he injected at the base I suspect his injection was further distally than he thought.  The blanched area may slough but should heal fine.  He was started to call should he note any further change.  I recommended he not inject for least a week and when he attempts his next injection it be performed in the office under supervision.  Return in about 1 week (around 07/11/2018) for Follow up with [facility] for injection.  Abbie Sons, [place] [address] [place], [place] [address] [phone]  I, Adele Schilder, am acting as a scribe for Abbie Sons, MD  I, Abbie Sons, MD, have reviewed all documentation for this visit. The documentation on 07/04/18 for the exam, diagnosis, procedures, and orders are all accurate and complete.

## 2018-07-04 NOTE — Telephone Encounter (Signed)
Patient seen in clinic

## 2018-07-04 NOTE — Telephone Encounter (Signed)
Pt LMOM and wants a call back and wants to know if instead of doing his 7mg  shot @ home,can he do 8mg ?

## 2018-07-08 ENCOUNTER — Ambulatory Visit (INDEPENDENT_AMBULATORY_CARE_PROVIDER_SITE_OTHER): Payer: Medicare Other | Admitting: Urology

## 2018-07-08 ENCOUNTER — Encounter: Payer: Self-pay | Admitting: Urology

## 2018-07-08 VITALS — BP 129/80 | HR 134 | Ht 70.0 in | Wt 180.0 lb

## 2018-07-08 DIAGNOSIS — N529 Male erectile dysfunction, unspecified: Secondary | ICD-10-CM | POA: Diagnosis not present

## 2018-07-08 NOTE — Progress Notes (Signed)
07/08/2018 9:20 AM   Vincent Mason 01-Dec-1935 [id number]  Referring provider: Birdie Sons, [place],  [address]  Chief Complaint  Patient presents with  . Trimix injection    HPI: Vincent Mason is a 83 y.o. White or Caucasian male that presents today for a TriMix Injection for his Erectile Dysfunction.  He originally presented on 05/04/2018, with Dr. Bernardo Heater, with a 15-monthhistory of difficulty achieving and maintaining erection. Previous trials with Viagra and generic sildenafil ended without improvement.  On 06/17/2018, he presented for ICI training and received 5 mcg of prostaglandin establishing a tumescent erection. At his follow up visit (06/22/18), he reported his erection was unsatisfactory and was injected with 10 mcg to no avail.  On 06/23/2018, he returned and received 10/20 mcg Edex, which he also found ineffective. He was offered penile prosthesis placement but presented a preference towards proceeding with Trimix and 06/30/2018, he returned for a Trimix trial, per his request, and was injected with 0.6 mL of Trimix. The patient remained adamantly against penile prosthesis placement and would like "other options" to successful achieve and maintain erection.   On 07/04/2018, the patient presented to clinic having developed blistering in the distal penile shaft subsequent to his attempting to inject himself with 0.8 mL of Trimix at the right penile base.  This was, according to the patient, the first time he had attempted to inject himself since he had injection training.  Patient reports today that he had pulled his foreskin back before injecting himself, and pushing the needle in 'as far as it would go.'  Patient reports he did this twice, and the first time experienced burning pain.  Patient denies that he got an erection during the attempt.  Patient was reminded to leave the escar alone and allow it to heal completely before attempting  injection again.  PMH: Past Medical History:  Diagnosis Date  . Acute encephalopathy 09/03/2015  . [facility]rial [facility] and flutter ([facility] 2013   [facility] [place]   . C. difficile colitis 09/12/2015  . COPD (chronic obstructive pulmonary disease) ([place]   . HCAP (healthcare-associated pneumonia) 09/12/2015  . Hyperlipidemia   . Hypertension   . Kidney disease, chronic, stage III (moderate, EGFR 30-59 ml/min) ([facility]    ARF 2013  . OSA (obstructive sleep apnea)    non-compliant with CPAP.   .Marland KitchenSevere sepsis with septic shock ([facility] 09/12/2015    Surgical History: Past Surgical History:  Procedure Laterality Date  . CATARACT EXTRACTION    . CORONARY ARTERY BYPASS GRAFT  10/1997  . history of cervical discectomy  2009   C5-C6  . Hyperplastic colon polyp  02/2002   Sigmoid polyps    Home Medications:  Allergies as of 07/08/2018      Reactions   Decongestant  [oxymetazoline]       Medication List       Accurate as of July 08, 2018  9:20 AM. Always use your most recent med list.        ALIVE MENS ENERGY Tabs Take 1 tablet by mouth daily.   allopurinol 100 MG tablet Commonly known as:  ZYLOPRIM Take 2 tablets (200 mg total) by mouth daily. TAKE IN PLACE OF FEBUXOSTAT   apixaban 5 MG Tabs tablet Commonly known as:  ELIQUIS Take 1/2 tablet twice a day   aspirin EC 81 MG tablet Take 81 mg by mouth daily.   digoxin 0.125 MG tablet  Commonly known as:  LANOXIN Take 0.125 mg by mouth daily.   divalproex 250 MG DR tablet Commonly known as:  DEPAKOTE Take 1 tablet by mouth 2 (two) times daily.   ferrous sulfate 325 (65 FE) MG tablet Take 1 tablet (325 mg total) by mouth daily.   gabapentin 300 MG capsule Commonly known as:  NEURONTIN Take 1 capsule (300 mg total) by mouth 2 (two) times daily.   levETIRAcetam 500 MG tablet Commonly known as:  KEPPRA Take 500 mg by mouth 2 (two) times daily.   metoprolol succinate 25 MG 24 hr tablet Commonly known  as:  TOPROL-XL Take 0.5 tablets (12.5 mg total) by mouth daily.   NONFORMULARY OR COMPOUNDED ITEM Trimix (30/1/10)-(Pap/Phent/PGE) Dosage: Inject 1.0 cc per injection 3cc size vial Qty 1 Refills [place] [phone] Fax [phone]   oxyCODONE 15 MG immediate release tablet Commonly known as:  ROXICODONE Take 1 tablet (15 mg total) by mouth every 6 (six) hours as needed for pain.   polyethylene glycol packet Commonly known as:  MIRALAX / GLYCOLAX Take 17 g by mouth daily.   ranitidine 150 MG tablet Commonly known as:  ZANTAC Take 150 mg by mouth 2 (two) times daily.   sildenafil 20 MG tablet Commonly known as:  REVATIO TAKE ONE TABLET 3 TIMES A DAY AS NEEDED   spironolactone 25 MG tablet Commonly known as:  ALDACTONE Take 0.5 tablets (12.5 mg total) by mouth daily.   torsemide 20 MG tablet Commonly known as:  DEMADEX Take one daily as needed for excessive swelling   Vitamin D3 25 MCG (1000 UT) Caps Take by mouth daily.       Allergies:  Allergies  Allergen Reactions  . Decongestant  [Oxymetazoline]     Family History: Family History  Problem Relation Age of Onset  . Hypertension Mother   . Hypertension Father     Social History:  reports that he has quit smoking. He has never used smokeless tobacco. He reports that he does not drink alcohol or use drugs.  ROS: UROLOGY Frequent Urination?: No Hard to postpone urination?: No Burning/pain with urination?: No Get up at night to urinate?: No Leakage of urine?: No Urine stream starts and stops?: No Trouble starting stream?: No Do you have to strain to urinate?: No Blood in urine?: No Urinary tract infection?: No Sexually transmitted disease?: No Injury to kidneys or bladder?: No Painful intercourse?: No Weak stream?: No Erection problems?: Yes Penile pain?: No  Gastrointestinal Nausea?: No Vomiting?: No Indigestion/heartburn?: No Diarrhea?: No Constipation?:  No  Constitutional Fever: No Night sweats?: No Weight loss?: No Fatigue?: No  Skin Skin rash/lesions?: No Itching?: No  Eyes Blurred vision?: No Double vision?: No  Ears/Nose/Throat Sore throat?: No Sinus problems?: No  Hematologic/Lymphatic Swollen glands?: No Easy bruising?: No  Cardiovascular Leg swelling?: No Chest pain?: No  Respiratory Cough?: No Shortness of breath?: No  Endocrine Excessive thirst?: No  Musculoskeletal Back pain?: No Joint pain?: No  Neurological Headaches?: No Dizziness?: No  Psychologic Depression?: No Anxiety?: No  Physical Exam: BP 129/80 (BP Location: Left Arm, Patient Position: Sitting)   Pulse (!) 134   Ht 5' 10"  (1.778 m)   Wt 180 lb (81.6 kg)   BMI 25.83 kg/m   Constitutional:  Well nourished. Alert and oriented, No acute distress. Cardiovascular: No clubbing, cyanosis, or edema. Respiratory: Normal respiratory effort, no increased work of breathing. GU: No CVA tenderness.  No bladder fullness or masses.  Patient with  uncircumcised phallus. On the dorsal side of foreskin was a 5 mm area of eschar present, but no erythema, no drainage, no crepitus.{  Patient has a section of his foreskin that is necrotic.  Urethral meatus is patent.  No penile discharge. No penile lesions or rashes.  Skin: No rashes, bruises or suspicious lesions. Neurologic: Grossly intact, no focal deficits, moving all 4 extremities. Psychiatric: Normal mood and affect.  Assessment & Plan:    1. Erectile Dysfunction - Patient has previously attempted the following medical interventions and found them ineffective for treatment: Viagra, sildenafil, prostaglandin (up to 10 mcg), and Edex (10 mcg) - Per patient's description and exam, I feel that he injected 8 mcg Trimix into the subcutaneous tissue of foreskin. We instructed him to not inject himself at all and return next week so I may witness him with self-injection - Patient remains adament against a  penile prosthesis  Return for trimix injection teaching .  Zara Council, PA-C  [facility] [address], [place], [place] [address] [phone]  I, Adele Schilder, am acting as a Education administrator for [employer], PA-C.   I have reviewed the above documentation for accuracy and completeness, and I agree with the above.    Zara Council, PA-C

## 2018-07-12 ENCOUNTER — Ambulatory Visit: Payer: Medicare Other | Admitting: Urology

## 2018-07-12 NOTE — Progress Notes (Incomplete)
07/12/2018 8:17 AM   Marigene Ehlers 06-21-1935 [id number]  Referring provider: Birdie Sons, [place], [place] [address]  No chief complaint on file.   HPI: KIMBALL APPLEBY is a 83 y.o. White or Caucasian male that presents today for a TriMix Injection for his Erectile Dysfunction.  He originally presented on 05/04/2018, with Dr. Bernardo Heater, with a 31-monthhistory of difficulty achieving and maintaining erection. Previous trials with Viagra and generic sildenafil ended without improvement.  On 06/17/2018, he presented for ICI training and received 5 mcg of prostaglandin establishing a tumescent erection. At his follow up visit (06/22/18), he reported his erection was unsatisfactory and was injected with 10 mcg to no avail.  On 06/23/2018, he returned and received 10/20 mcg Edex, which he also found ineffective. He was offered penile prosthesis placement but presented a preference towards proceeding with Trimix and 06/30/2018, he returned for a Trimix trial, per his request, and was injected with 0.6 mL of Trimix. The patient remained adamantly against penile prosthesis placement and would like "other options" to successful achieve and maintain erection.   On 07/04/2018, the patient presented to clinic having developed blistering in the distal penile shaft subsequent to his attempting to inject himself with 0.8 mL of Trimix at the right penile base.  This was, according to the patient, the first time he had attempted to inject himself since he had injection training.  Patient reported on 07/08/2018 that he had pulled his foreskin back before injecting himself, and had pushed the needle in 'as far as it would go.'  Patient reported that he did this twice, and the first time experienced burning pain.  Patient denied that he got an erection during the attempt.  Patient was reminded to leave the escar alone and allow it to heal completely before attempting injection  again.  ***  PMH: Past Medical History:  Diagnosis Date   Acute encephalopathy 09/03/2015   Atrial fibrillation and flutter ([facility] 2013   [facility]    C. difficile colitis 09/12/2015   COPD (chronic obstructive pulmonary disease) (HCC)    HCAP (healthcare-associated pneumonia) 09/12/2015   Hyperlipidemia    Hypertension    Kidney disease, chronic, stage III (moderate, EGFR 30-59 ml/min) ([place]    ARF 2013   OSA (obstructive sleep apnea)    non-compliant with CPAP.    Severe sepsis with septic shock ([facility] 09/12/2015    Surgical History: Past Surgical History:  Procedure Laterality Date   CATARACT EXTRACTION     CORONARY ARTERY BYPASS GRAFT  10/1997   history of cervical discectomy  2009   C5-C6   Hyperplastic colon polyp  02/2002   Sigmoid polyps    Home Medications:  Allergies as of 07/12/2018      Reactions   Decongestant  [oxymetazoline]       Medication List       Accurate as of July 12, 2018  8:17 AM. Always use your most recent med list.        ALIVE MENS ENERGY Tabs Take 1 tablet by mouth daily.   allopurinol 100 MG tablet Commonly known as:  ZYLOPRIM Take 2 tablets (200 mg total) by mouth daily. TAKE IN PLACE OF FEBUXOSTAT   apixaban 5 MG Tabs tablet Commonly known as:  ELIQUIS Take 1/2 tablet twice a day   aspirin EC 81 MG tablet Take 81 mg by mouth daily.   digoxin 0.125 MG tablet Commonly  known as:  LANOXIN Take 0.125 mg by mouth daily.   divalproex 250 MG DR tablet Commonly known as:  DEPAKOTE Take 1 tablet by mouth 2 (two) times daily.   ferrous sulfate 325 (65 FE) MG tablet Take 1 tablet (325 mg total) by mouth daily.   gabapentin 300 MG capsule Commonly known as:  NEURONTIN Take 1 capsule (300 mg total) by mouth 2 (two) times daily.   levETIRAcetam 500 MG tablet Commonly known as:  KEPPRA Take 500 mg by mouth 2 (two) times daily.   metoprolol succinate 25 MG 24 hr tablet Commonly known as:   TOPROL-XL Take 0.5 tablets (12.5 mg total) by mouth daily.   NONFORMULARY OR COMPOUNDED ITEM Trimix (30/1/10)-(Pap/Phent/PGE) Dosage: Inject 1.0 cc per injection 3cc size vial Qty 1 Refills [place] [phone] Fax [phone]   oxyCODONE 15 MG immediate release tablet Commonly known as:  ROXICODONE Take 1 tablet (15 mg total) by mouth every 6 (six) hours as needed for pain.   polyethylene glycol packet Commonly known as:  MIRALAX / GLYCOLAX Take 17 g by mouth daily.   ranitidine 150 MG tablet Commonly known as:  ZANTAC Take 150 mg by mouth 2 (two) times daily.   sildenafil 20 MG tablet Commonly known as:  REVATIO TAKE ONE TABLET 3 TIMES A DAY AS NEEDED   spironolactone 25 MG tablet Commonly known as:  ALDACTONE Take 0.5 tablets (12.5 mg total) by mouth daily.   torsemide 20 MG tablet Commonly known as:  DEMADEX Take one daily as needed for excessive swelling   Vitamin D3 25 MCG (1000 UT) Caps Take by mouth daily.       Allergies:  Allergies  Allergen Reactions   Decongestant  [Oxymetazoline]     Family History: Family History  Problem Relation Age of Onset   Hypertension Mother    Hypertension Father     Social History:  reports that he has quit smoking. He has never used smokeless tobacco. He reports that he does not drink alcohol or use drugs.  ROS:                                        Physical Exam: There were no vitals taken for this visit.  Constitutional:  Well nourished. Alert and oriented, No acute distress. {HEENT: [place] AT, moist mucus membranes.  Trachea midline, no masses.} Cardiovascular: No clubbing, cyanosis, or edema. Respiratory: Normal respiratory effort, no increased work of breathing. {GI: Abdomen is soft, non tender, non distended, no abdominal masses. Liver and spleen not palpable.  No hernias appreciated.  Stool sample for occult testing is not indicated.} {GU: No CVA tenderness.  No  bladder fullness or masses.  Patient with circumcised/uncircumcised phallus. ***Foreskin easily retracted***  Urethral meatus is patent.  No penile discharge. No penile lesions or rashes. Scrotum without lesions, cysts, rashes and/or edema.  Testicles are located scrotally bilaterally. No masses are appreciated in the testicles. Left and right epididymis are normal.} {Rectal: Patient with normal sphincter tone.  Anus and perineum without scarring or rashes.  No rectal masses are appreciated. Prostate is approximately *** grams, *** nodules are appreciated.  Seminal vesicles are normal.} Skin: No rashes, bruises or suspicious lesions. {Lymph: No cervical or inguinal adenopathy.} Neurologic: Grossly intact, no focal deficits, moving all 4 extremities. Psychiatric: Normal mood and affect.  Assessment & Plan:    1. Erectile Dysfunction -  Patient has previously attempted the following medical interventions and found them ineffective for treatment: Viagra, sildenafil, prostaglandin (up to 10 mcg), and Edex (10 mcg) *** - Per patient's description and exam, I feel that he injected 8 mcg Trimix into the subcutaneous tissue of foreskin. We instructed him to not inject himself at all and return next week so I may witness him with self-injection - Patient remains adament against a penile prosthesis  No follow-ups on file.  Zara Council, PA-C  [facility] [address], [place], [place] [address] [phone]  I, Adele Schilder, am acting as a Education administrator for [employer], PA-C.   {Add Scribe Attestation Statement}

## 2018-07-14 ENCOUNTER — Ambulatory Visit (INDEPENDENT_AMBULATORY_CARE_PROVIDER_SITE_OTHER): Payer: Medicare Other | Admitting: Urology

## 2018-07-14 ENCOUNTER — Encounter: Payer: Self-pay | Admitting: Urology

## 2018-07-14 VITALS — BP 130/74 | HR 69 | Ht 70.0 in | Wt 180.0 lb

## 2018-07-14 DIAGNOSIS — N529 Male erectile dysfunction, unspecified: Secondary | ICD-10-CM | POA: Diagnosis not present

## 2018-07-14 DIAGNOSIS — N489 Disorder of penis, unspecified: Secondary | ICD-10-CM | POA: Diagnosis not present

## 2018-07-14 NOTE — Progress Notes (Signed)
07/14/2018 9:00 AM   Vincent Mason 11-21-1935 [id number]  Referring provider: Birdie Sons, MD [address] [place], [place] [address]  Chief Complaint  Patient presents with  . Trimix injection    HPI: Vincent Mason is a 83 y.o. White or Caucasian male that presents today for a TriMix Injection for his Erectile Dysfunction.  He originally presented on 05/04/2018, with Dr. Bernardo Heater, with a 82-monthhistory of difficulty achieving and maintaining erection. Previous trials with Viagra and generic sildenafil ended without improvement.  On 06/17/2018, he presented for ICI training and received 5 mcg of prostaglandin establishing a tumescent erection. At his follow up visit (06/22/18), he reported his erection was unsatisfactory and was injected with 10 mcg to no avail.  On 06/23/2018, he returned and received 10/20 mcg Edex, which he also found ineffective. He was offered penile prosthesis placement but presented a preference towards proceeding with Trimix and 06/30/2018, he returned for a Trimix trial, per his request, and was injected with 0.6 mL of Trimix. The patient remained adamantly against penile prosthesis placement and would like "other options" to successful achieve and maintain erection.   On 07/04/2018, the patient presented to clinic having developed blistering in the distal penile shaft subsequent to his attempting to inject himself with 0.8 mL of Trimix at the right penile base.  This was, according to the patient, the first time he had attempted to inject himself since he had injection training.  Patient reported on 07/08/2018 that he had pulled his foreskin back before injecting himself, and had pushed the needle in 'as far as it would go.'  Patient reported that he did this twice, and the first time experienced burning pain.  Patient denied that he got an erection during the attempt.  Patient was reminded to leave the escar alone and allow it to heal completely  before attempting injection again.  Today, he presents for another Trimix injection.  He does not want to inject himself at this time and would like another opportunity to observe another erection.  He states he has not injected himself since he was last seen.  He is not having any spontaneous erections.    PMH: Past Medical History:  Diagnosis Date  . Acute encephalopathy 09/03/2015  . Atrial fibrillation and flutter ([facility] 2013   [facility] [place]   . C. difficile colitis 09/12/2015  . COPD (chronic obstructive pulmonary disease) ([facility]   . HCAP (healthcare-associated pneumonia) 09/12/2015  . Hyperlipidemia   . Hypertension   . Kidney disease, chronic, stage III (moderate, EGFR 30-59 ml/min) ([facility]    ARF 2013  . OSA (obstructive sleep apnea)    non-compliant with CPAP.   .[place] KitchenSevere sepsis with septic shock ([facility] 09/12/2015    Surgical History: Past Surgical History:  Procedure Laterality Date  . CATARACT EXTRACTION    . CORONARY ARTERY BYPASS GRAFT  10/1997  . history of cervical discectomy  2009   C5-C6  . Hyperplastic colon polyp  02/2002   Sigmoid polyps    Home Medications:  Allergies as of 07/14/2018      Reactions   Decongestant  [oxymetazoline]       Medication List       Accurate as of July 14, 2018  9:00 AM. Always use your most recent med list.        ALIVE MENS ENERGY Tabs Take 1 tablet by mouth daily.   allopurinol 100 MG tablet Commonly known  as:  ZYLOPRIM Take 2 tablets (200 mg total) by mouth daily. TAKE IN PLACE OF FEBUXOSTAT   apixaban 5 MG Tabs tablet Commonly known as:  ELIQUIS Take 1/2 tablet twice a day   aspirin EC 81 MG tablet Take 81 mg by mouth daily.   digoxin 0.125 MG tablet Commonly known as:  LANOXIN Take 0.125 mg by mouth daily.   divalproex 250 MG DR tablet Commonly known as:  DEPAKOTE Take 1 tablet by mouth 2 (two) times daily.   ferrous sulfate 325 (65 FE) MG tablet Take 1 tablet (325 mg total) by mouth  daily.   gabapentin 300 MG capsule Commonly known as:  NEURONTIN Take 1 capsule (300 mg total) by mouth 2 (two) times daily.   levETIRAcetam 500 MG tablet Commonly known as:  KEPPRA Take 500 mg by mouth 2 (two) times daily.   metoprolol succinate 25 MG 24 hr tablet Commonly known as:  TOPROL-XL Take 0.5 tablets (12.5 mg total) by mouth daily.   NONFORMULARY OR COMPOUNDED ITEM Trimix (30/1/10)-(Pap/Phent/PGE) Dosage: Inject 1.0 cc per injection 3cc size vial Qty 1 Refills [place] [phone] Fax [phone]   oxyCODONE 15 MG immediate release tablet Commonly known as:  ROXICODONE Take 1 tablet (15 mg total) by mouth every 6 (six) hours as needed for pain.   polyethylene glycol packet Commonly known as:  MIRALAX / GLYCOLAX Take 17 g by mouth daily.   ranitidine 150 MG tablet Commonly known as:  ZANTAC Take 150 mg by mouth 2 (two) times daily.   sildenafil 20 MG tablet Commonly known as:  REVATIO TAKE ONE TABLET 3 TIMES A DAY AS NEEDED   spironolactone 25 MG tablet Commonly known as:  ALDACTONE Take 0.5 tablets (12.5 mg total) by mouth daily.   torsemide 20 MG tablet Commonly known as:  DEMADEX Take one daily as needed for excessive swelling   Vitamin D3 25 MCG (1000 UT) Caps Take by mouth daily.       Allergies:  Allergies  Allergen Reactions  . Decongestant  [Oxymetazoline]     Family History: Family History  Problem Relation Age of Onset  . Hypertension Mother   . Hypertension Father     Social History:  reports that he has quit smoking. He has never used smokeless tobacco. He reports that he does not drink alcohol or use drugs.  ROS: UROLOGY Frequent Urination?: No Hard to postpone urination?: No Burning/pain with urination?: No Get up at night to urinate?: No Leakage of urine?: No Urine stream starts and stops?: No Trouble starting stream?: No Do you have to strain to urinate?: No Blood in urine?: No Urinary tract  infection?: No Sexually transmitted disease?: No Injury to kidneys or bladder?: No Painful intercourse?: No Weak stream?: No Erection problems?: Yes Penile pain?: No  Gastrointestinal Nausea?: No Vomiting?: No Indigestion/heartburn?: No Diarrhea?: No Constipation?: No  Constitutional Fever: No Night sweats?: No Weight loss?: No Fatigue?: No  Skin Skin rash/lesions?: No Itching?: No  Eyes Blurred vision?: No Double vision?: No  Ears/Nose/Throat Sore throat?: No Sinus problems?: No  Hematologic/Lymphatic Swollen glands?: No Easy bruising?: No  Cardiovascular Leg swelling?: No Chest pain?: No  Respiratory Cough?: No Shortness of breath?: No  Endocrine Excessive thirst?: No  Musculoskeletal Back pain?: No Joint pain?: No  Neurological Headaches?: No Dizziness?: No  Psychologic Depression?: No Anxiety?: No  Physical Exam: BP 130/74 (BP Location: Left Arm, Patient Position: Sitting)   Pulse 69   Ht _0  (1.778  m)   Wt 180 lb (81.6 kg)   BMI 25.83 kg/m   Constitutional:  Well nourished. Alert and oriented, No acute distress. GU: Patient with uncircumcised phallus.  Foreskin easily retracted.  Urethral meatus is patent.  No penile discharge. The area of eschar is still present encompassing an area of 5 mm x 4m.  No fluctuance  Skin: No rashes, bruises or suspicious lesions. Lymph: No cervical or inguinal adenopathy. Neurologic: Grossly intact, no focal deficits, moving all 4 extremities. Psychiatric: Normal mood and affect.  Procedure Patient's left corpus cavernosum is identified.  An area near the base of the penis is cleansed with rubbing alcohol.  Careful to avoid the dorsal vein, 10 mcg of Trimix (papaverine 30 mg, phentolamine 1 mg and prostaglandin E1 10 mcg, Lot # [id number]   Exp. 08/06/2018)) is injected at a 90 degree angle into the left corpus cavernosum near the base of the penis.  Patient experienced a semi firm erection in 15  minutes.     Assessment & Plan:    1. Erectile Dysfunction -patient has failed ICI and PDE5i -given pamphlets on the penile prothesis - will call back if he desires an appointment with Dr. FFrancesca Jewett-Advised patient of the condition of priapism, painful erection lasting for more than four hours, and to contact the office immediately or seek treatment in the ED  2. Penile lesion -likely the result of injecting air into the foreskin  Return for patient to call for referral to Dr. FFrancesca Jewett.  SZara Council PA-C  [facility] [address] [place] [phone]

## 2018-07-20 ENCOUNTER — Telehealth: Payer: Self-pay | Admitting: Urology

## 2018-07-20 MED ORDER — NONFORMULARY OR COMPOUNDED ITEM: 0 refills | Status: DC

## 2018-07-20 NOTE — Telephone Encounter (Signed)
Script printed and faxed.

## 2018-07-20 NOTE — Telephone Encounter (Signed)
Would you call in a Trimix prescription (test dose vial) for Mr. Vincent Mason?

## 2018-07-21 ENCOUNTER — Telehealth: Payer: Self-pay

## 2018-07-21 NOTE — Telephone Encounter (Signed)
Testosterone Rx sent to pharmacy

## 2018-07-22 ENCOUNTER — Ambulatory Visit (INDEPENDENT_AMBULATORY_CARE_PROVIDER_SITE_OTHER): Payer: Medicare Other | Admitting: Family Medicine

## 2018-07-22 ENCOUNTER — Encounter: Payer: Self-pay | Admitting: Family Medicine

## 2018-07-22 ENCOUNTER — Other Ambulatory Visit: Payer: Self-pay

## 2018-07-22 VITALS — BP 124/60 | HR 92 | Temp 97.9°F | Ht 70.0 in | Wt 193.8 lb

## 2018-07-22 DIAGNOSIS — G2581 Restless legs syndrome: Secondary | ICD-10-CM | POA: Diagnosis not present

## 2018-07-22 DIAGNOSIS — N183 Chronic kidney disease, stage 3 unspecified: Secondary | ICD-10-CM

## 2018-07-22 DIAGNOSIS — E790 Hyperuricemia without signs of inflammatory arthritis and tophaceous disease: Secondary | ICD-10-CM | POA: Diagnosis not present

## 2018-07-22 DIAGNOSIS — M5136 Other intervertebral disc degeneration, lumbar region: Secondary | ICD-10-CM | POA: Diagnosis not present

## 2018-07-22 MED ORDER — OXYCODONE HCL 15 MG PO TABS: 15.0000 mg | ORAL_TABLET | Freq: Four times a day (QID) | ORAL | 0 refills | Status: DC | PRN

## 2018-07-22 NOTE — Progress Notes (Signed)
Patient: Vincent Mason Male    DOB: [birth year]   83 y.o.   MRN: [id number] Visit Date: 07/22/2018  Today's Provider: Lelon Huh, MD   Chief Complaint  Patient presents with  . Follow-up    1 month for chronic kidney disease, calculus of kidney, restless leg syndrome and leg swelling   Subjective:     HPI  Follow up for chronic kidney disease, calculus of kidney, restless leg syndrome The patient was last seen for this 1 months ago. Changes made at last visit include changed Uloric to allopurinol 200 mg a day due to cardiac risk associated with Uloric.  Advised to take gabapentin only at bedtime due to some dizziness for the next 4 days and then increase to BID, however he states symptoms are pretty well controlled just taking at night, and it still makes him a little dizzy the next day.    LEG SWELLING: restart spironolactone at 1/2 tablet for now.  Advised to only take torsemide prn when leg swells more than usual.  He reports good compliance with treatment. He feels that condition is Improved. He is having side effects. dizziness  ------------------------------------------------------------------------------------   Allergies  Allergen Reactions  . Decongestant  [Oxymetazoline]      Current Outpatient Medications:  .  allopurinol (ZYLOPRIM) 100 MG tablet, Take 2 tablets (200 mg total) by mouth daily. TAKE IN PLACE OF FEBUXOSTAT, Disp: 60 tablet, Rfl: 1 .  apixaban (ELIQUIS) 5 MG TABS tablet, Take 1/2 tablet twice a day (Patient taking differently: 5 mg. Take 1 tablet twice a day), Disp: , Rfl:  .  aspirin EC 81 MG tablet, Take 81 mg by mouth daily., Disp: , Rfl:  .  Cholecalciferol (VITAMIN D3) 1000 units CAPS, Take by mouth daily., Disp: , Rfl:  .  digoxin (LANOXIN) 0.125 MG tablet, Take 0.125 mg by mouth daily., Disp: , Rfl:  .  divalproex (DEPAKOTE) 250 MG DR tablet, Take 1 tablet by mouth 2 (two) times daily., Disp: , Rfl:  .  ferrous sulfate 325 (65 FE) MG  tablet, Take 1 tablet (325 mg total) by mouth daily., Disp: 60 tablet, Rfl: 3 .  gabapentin (NEURONTIN) 300 MG capsule, Take 1 capsule (300 mg total) by mouth 2 (two) times daily., Disp: 60 capsule, Rfl: 1 .  metoprolol succinate (TOPROL-XL) 25 MG 24 hr tablet, Take 0.5 tablets (12.5 mg total) by mouth daily., Disp: 30 tablet, Rfl: 5 .  Multiple Vitamins-Minerals (ALIVE MENS ENERGY) TABS, Take 1 tablet by mouth daily., Disp: , Rfl:  .  NONFORMULARY OR COMPOUNDED ITEM, Trimix (30/1/10)-(Pap/Phent/PGE) Dosage: 63ml test dose vial 3cc size vial Qty1 [place] [phone] Fax [phone], Disp: 1 each, Rfl: 0 .  oxyCODONE (ROXICODONE) 15 MG immediate release tablet, Take 1 tablet (15 mg total) by mouth every 6 (six) hours as needed for pain., Disp: 120 tablet, Rfl: 0 .  polyethylene glycol (MIRALAX / GLYCOLAX) packet, Take 17 g by mouth daily., Disp: 14 each, Rfl: 0 .  spironolactone (ALDACTONE) 25 MG tablet, Take 0.5 tablets (12.5 mg total) by mouth daily. (Patient taking differently: Take 25 mg by mouth daily. ), Disp: , Rfl:  .  torsemide (DEMADEX) 20 MG tablet, Take one daily as needed for excessive swelling (Patient taking differently: 2 (two) times daily. Take one daily as needed for excessive swelling), Disp: , Rfl:   .  NONFORMULARY OR COMPOUNDED ITEM, Trimix (30/1/10)-(Pap/Phent/PGE) Dosage: Inject 1.0 cc per injection 3cc size vial  [place] [phone] Fax [phone] (Patient not taking: Reported on 07/22/2018), Disp: 1 each, Rfl: 0 .  ranitidine (ZANTAC) 150 MG tablet, Take 150 mg by mouth 2 (two) times daily., Disp: , Rfl: (not taking) .  sildenafil (REVATIO) 20 MG tablet, TAKE ONE TABLET 3 TIMES A DAY AS NEEDED, Disp: 30 tablet, Rfl: 11  Review of Systems  Constitutional: Negative.   HENT: Negative.   Eyes: Negative.   Respiratory: Negative.   Cardiovascular: Negative.   Gastrointestinal: Negative.   Endocrine: Negative.     Genitourinary: Negative.   Musculoskeletal: Negative.   Skin: Negative.   Allergic/Immunologic: Negative.   Neurological: Positive for dizziness.  Hematological: Negative.   Psychiatric/Behavioral: Negative.     Social History   Tobacco Use  . Smoking status: Former Research scientist (life sciences)  . Smokeless tobacco: Never Used  . Tobacco comment: Quit in 1990  Substance Use Topics  . Alcohol use: No      Objective:   BP 124/60 (BP Location: Left Arm, Patient Position: Sitting, Cuff Size: Normal)   Pulse 92   Temp 97.9 F (36.6 C) (Oral)   Ht 5\' 10"  (1.778 m)   Wt 193 lb 12.8 oz (87.9 kg)   SpO2 98%   BMI 27.81 kg/m  Vitals:   07/22/18 1528  BP: 124/60  Pulse: 92  Temp: 97.9 F (36.6 C)  TempSrc: Oral  SpO2: 98%  Weight: 193 lb 12.8 oz (87.9 kg)  Height: 5\' 10"  (1.778 m)     Physical Exam   General Appearance:    Alert, cooperative, no distress  Eyes:    PERRL, conjunctiva/corneas clear, EOM's intact       Lungs:     Clear to auscultation bilaterally, respirations unlabored  Heart:    Regular rate and rhythm  Neurologic:   Awake, alert, oriented x 3. No apparent focal neurological           defect.          Assessment & Plan    1. Degenerative disc disease, lumbar refill - oxyCODONE (ROXICODONE) 15 MG immediate release tablet; Take 1 tablet (15 mg total) by mouth every 6 (six) hours as needed for pain.  Dispense: 120 tablet; Refill: 0  2. Chronic kidney disease (CKD), stage III (moderate) (HCC) He is due for follow up with nephrology in April. To follow up here for annual physical in June  3. Restless leg syndrome Much better with addition of gabapentin  4. Hyperuricemia Doing well with change from Uloric to allopurinol.      Lelon Huh, MD  [facility]

## 2018-07-22 NOTE — Patient Instructions (Signed)
.   Please review the attached list of medications and notify my office if there are any errors.   . Please bring all of your medications to every appointment so we can make sure that our medication list is the same as yours.   

## 2018-07-25 ENCOUNTER — Telehealth: Payer: Self-pay

## 2018-07-25 ENCOUNTER — Telehealth: Payer: Self-pay | Admitting: Urology

## 2018-07-25 NOTE — Telephone Encounter (Signed)
Called and gave verbal for five 6ml vials vs. 62ml per Larene [place] as Ed from [facility] states this is cheaper for patient. Called and advised patient.

## 2018-07-25 NOTE — Telephone Encounter (Signed)
Returned call to patient. Advised Rx was sent 07/20/18. Gave patient telephone number to [facility] per request.

## 2018-07-25 NOTE — Telephone Encounter (Signed)
Pt called office this morning and hasn't heard anything about his Trimix RX that should have been called in to [facility] compound pharmacy.  Please give pt a call.  According to phone notes, this has been done already.

## 2018-08-16 ENCOUNTER — Other Ambulatory Visit: Payer: Self-pay | Admitting: Family Medicine

## 2018-08-17 DIAGNOSIS — N183 Chronic kidney disease, stage 3 (moderate): Secondary | ICD-10-CM | POA: Diagnosis not present

## 2018-08-17 DIAGNOSIS — I25708 Atherosclerosis of coronary artery bypass graft(s), unspecified, with other forms of angina pectoris: Secondary | ICD-10-CM | POA: Diagnosis not present

## 2018-08-17 DIAGNOSIS — I10 Essential (primary) hypertension: Secondary | ICD-10-CM | POA: Diagnosis not present

## 2018-08-17 DIAGNOSIS — I482 Chronic atrial fibrillation, unspecified: Secondary | ICD-10-CM | POA: Diagnosis not present

## 2018-08-17 DIAGNOSIS — E782 Mixed hyperlipidemia: Secondary | ICD-10-CM | POA: Diagnosis not present

## 2018-08-22 ENCOUNTER — Ambulatory Visit: Payer: Self-pay | Admitting: Family Medicine

## 2018-08-25 DIAGNOSIS — I482 Chronic atrial fibrillation, unspecified: Secondary | ICD-10-CM | POA: Diagnosis not present

## 2018-09-06 DIAGNOSIS — I482 Chronic atrial fibrillation, unspecified: Secondary | ICD-10-CM | POA: Diagnosis not present

## 2018-09-06 DIAGNOSIS — I272 Pulmonary hypertension, unspecified: Secondary | ICD-10-CM | POA: Diagnosis not present

## 2018-09-06 DIAGNOSIS — I10 Essential (primary) hypertension: Secondary | ICD-10-CM | POA: Diagnosis not present

## 2018-09-06 DIAGNOSIS — I6522 Occlusion and stenosis of left carotid artery: Secondary | ICD-10-CM | POA: Diagnosis not present

## 2018-09-06 DIAGNOSIS — I25708 Atherosclerosis of coronary artery bypass graft(s), unspecified, with other forms of angina pectoris: Secondary | ICD-10-CM | POA: Diagnosis not present

## 2018-09-06 DIAGNOSIS — N183 Chronic kidney disease, stage 3 (moderate): Secondary | ICD-10-CM | POA: Diagnosis not present

## 2018-09-06 DIAGNOSIS — I129 Hypertensive chronic kidney disease with stage 1 through stage 4 chronic kidney disease, or unspecified chronic kidney disease: Secondary | ICD-10-CM | POA: Diagnosis not present

## 2018-09-06 DIAGNOSIS — E782 Mixed hyperlipidemia: Secondary | ICD-10-CM | POA: Diagnosis not present

## 2018-09-06 DIAGNOSIS — E876 Hypokalemia: Secondary | ICD-10-CM | POA: Diagnosis not present

## 2018-09-06 DIAGNOSIS — I25810 Atherosclerosis of coronary artery bypass graft(s) without angina pectoris: Secondary | ICD-10-CM | POA: Diagnosis not present

## 2018-09-06 DIAGNOSIS — I6521 Occlusion and stenosis of right carotid artery: Secondary | ICD-10-CM | POA: Diagnosis not present

## 2018-09-16 ENCOUNTER — Other Ambulatory Visit: Payer: Self-pay

## 2018-09-27 ENCOUNTER — Telehealth: Payer: Self-pay

## 2018-09-27 NOTE — Telephone Encounter (Signed)
Oxycodone and gabapentin cause drowsiness. He should cut the oxycodone to 1/2 tablet at a time. Only take the gabapentin at bedtime.

## 2018-09-27 NOTE — Telephone Encounter (Signed)
Patient's wife Vincent Mason advised as directed below.

## 2018-09-27 NOTE — Telephone Encounter (Signed)
Patient's wife Vincent Mason called requesting to speak with a nurse. She says she is worried about her husband because he is sleeping too much. She wants to know if one of his medications could be causing this. In the last week he has been sleeping all day long, had no energy and has had a decrease in appetite. Please advise.

## 2018-10-10 ENCOUNTER — Other Ambulatory Visit: Payer: Self-pay

## 2018-10-10 DIAGNOSIS — M5136 Other intervertebral disc degeneration, lumbar region: Secondary | ICD-10-CM

## 2018-10-10 NOTE — Telephone Encounter (Signed)
Patient is requesting a refill on his Oxycodone 15 MG. Please advise.

## 2018-10-10 NOTE — Telephone Encounter (Signed)
30 days dispensed on 09-21-2018

## 2018-10-11 ENCOUNTER — Telehealth: Payer: Self-pay

## 2018-10-11 ENCOUNTER — Other Ambulatory Visit: Payer: Self-pay | Admitting: Family Medicine

## 2018-10-11 DIAGNOSIS — M5136 Other intervertebral disc degeneration, lumbar region: Secondary | ICD-10-CM

## 2018-10-11 MED ORDER — OXYCODONE HCL 15 MG PO TABS: 15.0000 mg | ORAL_TABLET | Freq: Four times a day (QID) | ORAL | 0 refills | Status: DC | PRN

## 2018-10-11 NOTE — Telephone Encounter (Signed)
Patient states the he is out of the Oxycodone and really needs the medication filled. I explained that he was given the last refill on 09/21/18 but he still says that he is out and would like someone to call him back in reference to this matter.

## 2018-10-26 ENCOUNTER — Ambulatory Visit: Payer: Medicare Other

## 2018-10-26 ENCOUNTER — Encounter: Payer: Medicare Other | Admitting: Family Medicine

## 2018-11-09 DIAGNOSIS — I272 Pulmonary hypertension, unspecified: Secondary | ICD-10-CM | POA: Diagnosis not present

## 2018-11-09 DIAGNOSIS — I129 Hypertensive chronic kidney disease with stage 1 through stage 4 chronic kidney disease, or unspecified chronic kidney disease: Secondary | ICD-10-CM | POA: Diagnosis not present

## 2018-11-09 DIAGNOSIS — N183 Chronic kidney disease, stage 3 (moderate): Secondary | ICD-10-CM | POA: Diagnosis not present

## 2018-11-09 DIAGNOSIS — E876 Hypokalemia: Secondary | ICD-10-CM | POA: Diagnosis not present

## 2018-11-09 DIAGNOSIS — R600 Localized edema: Secondary | ICD-10-CM | POA: Diagnosis not present

## 2018-11-11 ENCOUNTER — Telehealth: Payer: Self-pay | Admitting: *Deleted

## 2018-11-11 NOTE — Telephone Encounter (Signed)
Advised patient as below. Patient reports that gabapentin did not really help much, so he will discontinue the medication.

## 2018-11-11 NOTE — Telephone Encounter (Signed)
Its for restless legs and it seemed to really help a lot when he first started it. If he's not having restless legs anymore he doesn't have to take it.

## 2018-11-11 NOTE — Telephone Encounter (Signed)
Patient called office wanting to know if he still needs to be taking gabapentin 300 mg. Patient states he has not been taking it lately. Patient seems confused. Patient would like for one of Dr. Maralyn Sago CMA's to call him back and let him know if he should being taking gabapentin. Please advise?

## 2018-11-11 NOTE — Telephone Encounter (Signed)
Please advise 

## 2018-11-14 ENCOUNTER — Other Ambulatory Visit: Payer: Self-pay | Admitting: Family Medicine

## 2018-11-14 ENCOUNTER — Other Ambulatory Visit: Payer: Self-pay

## 2018-11-14 DIAGNOSIS — M5136 Other intervertebral disc degeneration, lumbar region: Secondary | ICD-10-CM

## 2018-11-14 NOTE — Telephone Encounter (Signed)
30 days dispensed 10-19-2018

## 2018-11-14 NOTE — Telephone Encounter (Signed)
Pt needing a call back regarding the refill dates on his oxyCODONE (ROXICODONE) 15 MG immediate release tablet.Marland Kitchen  He wants to know if it's as early as 19th, 20th or if it's 27th of each month.  Please call pt back to advise asap.  Thanks, [employer]

## 2018-11-14 NOTE — Telephone Encounter (Signed)
Pt advised.  He has enough to get him though until June 26th.   Thanks,   -Mickel Baas

## 2018-11-14 NOTE — Telephone Encounter (Signed)
Each fill is to last 30 days. He was last dispensed 30 day supply on 5/27. His next fill is not to be until 6/26.

## 2018-11-17 ENCOUNTER — Other Ambulatory Visit: Payer: Self-pay | Admitting: Family Medicine

## 2018-11-17 DIAGNOSIS — M5136 Other intervertebral disc degeneration, lumbar region: Secondary | ICD-10-CM

## 2018-11-17 NOTE — Telephone Encounter (Signed)
Pt is out of his oxyCODONE (ROXICODONE) 15 MG immediate release tablet needing a refill on this today.  Please call pt back to let him know today.  Pt uses [facility] [place] [facility] [place] [phone] (Phone) [phone] (Fax)     Thanks, [employer]

## 2018-11-17 NOTE — Telephone Encounter (Signed)
I advised patient that Dr. Caryn Section is out of the office today. Patient was aware. He says he is due for a refill tomorrow and would like a refill sent in then.

## 2018-11-18 MED ORDER — OXYCODONE HCL 15 MG PO TABS: 15.0000 mg | ORAL_TABLET | Freq: Four times a day (QID) | ORAL | 0 refills | Status: DC | PRN

## 2018-12-13 ENCOUNTER — Other Ambulatory Visit: Payer: Self-pay | Admitting: Family Medicine

## 2018-12-13 DIAGNOSIS — M5136 Other intervertebral disc degeneration, lumbar region: Secondary | ICD-10-CM

## 2018-12-13 NOTE — Telephone Encounter (Signed)
Pt will need a refill on his Oxycodone 15 mg  He is going out of town and wants to know if he can have this prescription on a Friday 7/24th.  HE is actually due on Saturday but he is going out of town  [employer]

## 2018-12-15 MED ORDER — OXYCODONE HCL 15 MG PO TABS: 15.0000 mg | ORAL_TABLET | Freq: Four times a day (QID) | ORAL | 0 refills | Status: DC | PRN

## 2018-12-22 ENCOUNTER — Ambulatory Visit (INDEPENDENT_AMBULATORY_CARE_PROVIDER_SITE_OTHER): Payer: Medicare Other

## 2018-12-22 ENCOUNTER — Other Ambulatory Visit: Payer: Self-pay

## 2018-12-22 DIAGNOSIS — Z0000 Encounter for general adult medical examination without abnormal findings: Secondary | ICD-10-CM

## 2018-12-22 NOTE — Progress Notes (Signed)
Subjective:   Vincent Mason is a 83 y.o. male who presents for Medicare Annual/Subsequent preventive examination.    This visit is being conducted through telemedicine due to the COVID-19 pandemic. This patient has given me verbal consent via doximity to conduct this visit, patient states they are participating from their home address. Some vital signs may be absent or patient reported.    Patient identification: identified by name, DOB, and current address  Review of Systems:  N/A  Cardiac Risk Factors include: advanced age (>94mn, >>45women);male gender;hypertension     Objective:    Vitals: There were no vitals taken for this visit.  There is no height or weight on file to calculate BMI. Unable to obtain vitals due to visit being conducted via telephonically.   Advanced Directives 12/22/2018 10/22/2017 08/06/2017 01/21/2017 12/31/2016 12/02/2016 11/10/2016  Does Patient Have a Medical Advance Directive? No No No No No No No  Does patient want to make changes to medical advance directive? No - Patient declined - - - - - -  Would patient like information on creating a medical advance directive? - No - Patient declined Yes (Inpatient - patient requests chaplain consult to create a medical advance directive) No - Patient declined No - Patient declined Yes (Inpatient - patient requests chaplain consult to create a medical advance directive) -    Tobacco Social History   Tobacco Use  Smoking Status Former Smoker  Smokeless Tobacco Never Used  Tobacco Comment   Quit in 1990     Counseling given: Not Answered Comment: Quit in 1990   Clinical Intake:  Pre-visit preparation completed: Yes  Pain : No/denies pain Pain Score: 0-No pain     Nutritional Risks: None Diabetes: No  How often do you need to have someone help you when you read instructions, pamphlets, or other written materials from your doctor or pharmacy?: 1 - Never  Interpreter Needed?: No  Information [facility] by  :: [facility] L[facility]  Past Medical History:  Diagnosis Date  . Acute encephalopathy 09/03/2015  . [facility]rial [facility] and flutter [facility]   [facility] [address]   . C. difficile colitis 09/12/2015  . COPD (chronic obstructive pulmonary disease) ([place]   . HCAP (healthcare-associated pneumonia) 09/12/2015  . Hyperlipidemia   . Hypertension   . Kidney disease, chronic, stage III (moderate, EGFR 30-59 ml/min) ([facility]    ARF 2013  . OSA (obstructive sleep apnea)    non-compliant with CPAP.   .Marland KitchenSevere sepsis with septic shock ([facility] 09/12/2015   Past Surgical History:  Procedure Laterality Date  . CATARACT EXTRACTION    . CORONARY ARTERY BYPASS GRAFT  10/1997  . history of cervical discectomy  2009   C5-C6  . Hyperplastic colon polyp  02/2002   Sigmoid polyps   Family History  Problem Relation Age of Onset  . Hypertension Mother   . Hypertension Father    Social History   Socioeconomic History  . Marital status: Married    Spouse name: Not on file  . Number of children: 3  . Years of education: some colle  . Highest education level: Some college, no degree  Occupational History  . Occupation: Retired  SScientific laboratory technician . Financial resource strain: Not hard at all  . Food insecurity    Worry: Never true    Inability: Never true  . Transportation needs    Medical: No    Non-medical: No  Tobacco Use  . Smoking  status: Former Research scientist (life sciences)  . Smokeless tobacco: Never Used  . Tobacco comment: Quit in 1990  Substance and Sexual Activity  . Alcohol use: No  . Drug use: No  . Sexual activity: Yes  Lifestyle  . Physical activity    Days per week: 0 days    Minutes per session: 0 min  . Stress: Not at all  Relationships  . Social Herbalist on phone: Patient refused    Gets together: Patient refused    Attends religious service: Patient refused    Active member of club or organization: Patient refused    Attends meetings of clubs or organizations: Patient  refused    Relationship status: Patient refused  Other Topics Concern  . Not on file  Social History Narrative  . Not on file    Outpatient Encounter Medications as of 12/22/2018  Medication Sig  . apixaban (ELIQUIS) 5 MG TABS tablet Take 1/2 tablet twice a day (Patient taking differently: 5 mg 2 (two) times daily. )  . aspirin EC 81 MG tablet Take 81 mg by mouth daily.  . digoxin (LANOXIN) 0.125 MG tablet Take 0.125 mg by mouth daily.  . divalproex (DEPAKOTE) 250 MG DR tablet Take 1 tablet by mouth 2 (two) times daily.  . ferrous sulfate 325 (65 FE) MG tablet Take 1 tablet (325 mg total) by mouth daily.  Marland Kitchen levETIRAcetam (KEPPRA) 500 MG tablet Take 1 tablet by mouth 2 (two) times daily.  . metoprolol succinate (TOPROL-XL) 25 MG 24 hr tablet TAKE HALF A TABLET BY MOUTH DAILY  . Multiple Vitamins-Minerals (ALIVE MENS ENERGY) TABS Take 1 tablet by mouth daily.  Marland Kitchen oxyCODONE (ROXICODONE) 15 MG immediate release tablet Take 1 tablet (15 mg total) by mouth every 6 (six) hours as needed for pain.  . polyethylene glycol (MIRALAX / GLYCOLAX) packet Take 17 g by mouth daily.  Marland Kitchen spironolactone (ALDACTONE) 25 MG tablet Take 0.5 tablets (12.5 mg total) by mouth daily. (Patient taking differently: Take 25 mg by mouth daily. )  . torsemide (DEMADEX) 20 MG tablet Take one daily as needed for excessive swelling  . allopurinol (ZYLOPRIM) 100 MG tablet Take 2 tablets (200 mg total) by mouth daily. TAKE IN PLACE OF FEBUXOSTAT (Patient not taking: Reported on 12/22/2018)  . Cholecalciferol (VITAMIN D3) 1000 units CAPS Take by mouth daily.  Marland Kitchen gabapentin (NEURONTIN) 300 MG capsule Take 1 capsule (300 mg total) by mouth 2 (two) times daily. (Patient not taking: Reported on 12/22/2018)  . NONFORMULARY OR COMPOUNDED ITEM Trimix (30/1/10)-(Pap/Phent/PGE) Dosage: 48m test dose vial 3cc size vial Qty1 [phone] [phone](Patient not taking: Reported on 12/22/2018)  . sildenafil  (REVATIO) 20 MG tablet TAKE ONE TABLET 3 TIMES A DAY AS NEEDED (Patient not taking: Reported on 12/22/2018)   No facility-administered encounter medications on file as of 12/22/2018.     Activities of Daily Living In your present state of health, do you have any difficulty performing the following activities: 12/22/2018  Hearing? Y  Comment Has hearing loss in right ear. Wears a hearing aid.  Vision? N  Difficulty concentrating or making decisions? N  Walking or climbing stairs? N  Dressing or bathing? N  Doing errands, shopping? N  Preparing Food and eating ? N  Using the Toilet? N  In the past six months, have you accidently leaked urine? N  Do you have problems with loss of bowel control? N  Managing your Medications? Y  Comment  Wife manages medications.  Managing your Finances? N  Housekeeping or managing your Housekeeping? N  Comment Wife does house work.  Some recent data might be hidden    Patient Care Team: Birdie Sons, MD as PCP - General (Family Medicine) Corey Skains, MD as Consulting Physician (Internal Medicine) Dingeldein, Remo Lipps, MD as Consulting Physician (Ophthalmology) Murlean Iba, MD as Consulting Physician (Internal Medicine) Anabel Bene, MD as Referring Physician (Neurology)   Assessment:   This is a routine wellness examination for Vincent Mason.  Exercise Activities and Dietary recommendations Current Exercise Habits: The patient does not participate in regular exercise at present, Exercise limited by: orthopedic condition(s)  Goals    . diet     Recommend cutting down on portion size with daily meals.     Marland Kitchen DIET - INCREASE WATER INTAKE     Recommend increasing water intake to 6 glasses a day.        Fall Risk: Fall Risk  12/22/2018 07/22/2018 10/22/2017 12/31/2016 10/14/2016  Falls in the past year? 0 1 No Yes Yes  Number falls in past yr: - 0 - 1 1  Comment - - - yesterday  -  Injury with Fall? - 0 - No No  Follow up - - - - Falls  prevention discussed    FALL RISK PREVENTION PERTAINING TO THE HOME:  Any stairs in or around the home? Yes  If so, are there any without handrails? No   Home free of loose throw rugs in walkways, pet beds, electrical cords, etc? Yes  Adequate lighting in your home to reduce risk of falls? Yes   ASSISTIVE DEVICES UTILIZED TO PREVENT FALLS:  Life alert? No  Use of a cane, walker or w/c? No  Grab bars in the bathroom? No  Shower chair or bench in shower? Yes  Elevated toilet seat or a handicapped toilet? No   TIMED UP AND GO:  Was the test performed? No .    Depression Screen PHQ 2/9 Scores 12/22/2018 07/22/2018 10/22/2017 12/31/2016  PHQ - 2 Score 0 0 0 0  PHQ- 9 Score - - - -    Cognitive Function: Declined today.      6CIT Screen 10/22/2017 10/14/2016  What Year? 0 points 0 points  What month? 0 points 0 points  What time? 0 points 0 points  Count back from 20 0 points 0 points  Months in reverse 2 points 2 points  Repeat phrase 0 points 6 points  Total Score 2 8    Immunization History  Administered Date(s) Administered  . Influenza, High Dose Seasonal PF 06/17/2016, 04/08/2017, 02/21/2018  . PPD Test 12/03/2016  . Pneumococcal Conjugate-13 02/06/2014  . Pneumococcal Polysaccharide-23 03/19/2003  . Zoster 02/06/2014    Qualifies for Shingles Vaccine? Yes  Zostavax completed 02/06/14. Due for Shingrix. Education has been provided regarding the importance of this vaccine. Pt has been advised to call insurance company to determine out of pocket expense. Advised may also receive vaccine at local pharmacy or Health Dept. Verbalized acceptance and understanding.  Tdap: Although this vaccine is not a covered service during a Wellness Exam, does the patient still wish to receive this vaccine today?  No .   Flu Vaccine: Up to date  Pneumococcal Vaccine: Completed series  Screening Tests Health Maintenance  Topic Date Due  . TETANUS/TDAP  05/25/2026 (Originally  06/23/1954)  . INFLUENZA VACCINE  12/24/2018  . PNA vac Low Risk Adult  Completed   Cancer Screenings:  Colorectal Screening: No longer required.   Lung Cancer Screening: (Low Dose CT Chest recommended if Age 104-80 years, 30 pack-year currently smoking OR have quit w/in 15years.) does not qualify.   Additional Screening:  Vision Screening: Recommended annual ophthalmology exams for early detection of glaucoma and other disorders of the eye.  Dental Screening: Recommended annual dental exams for proper oral hygiene  Community Resource Referral:  CRR required this visit?  No        Plan:  I have personally reviewed and addressed the Medicare Annual Wellness questionnaire and have noted the following in the patient's chart:  A. Medical and social history B. Use of alcohol, tobacco or illicit drugs  C. Current medications and supplements D. Functional ability and status E.  Nutritional status F.  Physical activity G. Advance directives H. List of other physicians I.  Hospitalizations, surgeries, and ER visits in previous 12 months J.  [place] such as hearing and vision if needed, cognitive and depression L. Referrals and appointments   In addition, I have reviewed and discussed with patient certain preventive protocols, quality metrics, and best practice recommendations. A written personalized care plan for preventive services as well as general preventive health recommendations were provided to patient.   Glendora Score, LPN  3/40/3524 Nurse Health Advisor   Nurse Notes: None.

## 2018-12-22 NOTE — Patient Instructions (Addendum)
Vincent Mason , Thank you for taking time to come for your Medicare Wellness Visit. I appreciate your ongoing commitment to your health goals. Please review the following plan we discussed and let me know if I can assist you in the future.   Screening recommendations/referrals: Colonoscopy: No longer required.  Recommended yearly ophthalmology/optometry visit for glaucoma screening and checkup Recommended yearly dental visit for hygiene and checkup  Vaccinations: Influenza vaccine: Up to date Pneumococcal vaccine: Completed series Tdap vaccine: Pt declines today.  Shingles vaccine: Pt declines today.     Advanced directives: Advance directive discussed with you today. Even though you declined this today please call our office should you change your mind and we can give you the proper paperwork for you to fill out.  Conditions/risks identified: Continue to increase water intake to 6-8 8 oz glasses a day.  Next appointment: 02/07/19 @ 3:00 PM with Dr Caryn Section. Declined scheduling an AWV for 2021.   Preventive Care 4 Years and Older, Male Preventive care refers to lifestyle choices and visits with your health care provider that can promote health and wellness. What does preventive care include?  A yearly physical exam. This is also called an annual well check.  Dental exams once or twice a year.  Routine eye exams. Ask your health care provider how often you should have your eyes checked.  Personal lifestyle choices, including:  Daily care of your teeth and gums.  Regular physical activity.  Eating a healthy diet.  Avoiding tobacco and drug use.  Limiting alcohol use.  Practicing safe sex.  Taking low doses of aspirin every day.  Taking vitamin and mineral supplements as recommended by your health care provider. What happens during an annual well check? The services and screenings done by your health care provider during your annual well check will depend on your age, overall  health, lifestyle risk factors, and family history of disease. Counseling  Your health care provider may ask you questions about your:  Alcohol use.  Tobacco use.  Drug use.  Emotional well-being.  Home and relationship well-being.  Sexual activity.  Eating habits.  History of falls.  Memory and ability to understand (cognition).  Work and work Statistician. Screening  You may have the following tests or measurements:  Height, weight, and BMI.  Blood pressure.  Lipid and cholesterol levels. These may be checked every 5 years, or more frequently if you are over 14 years old.  Skin check.  Lung cancer screening. You may have this screening every year starting at age 83 if you have a 30-pack-year history of smoking and currently smoke or have quit within the past 15 years.  Fecal occult blood test (FOBT) of the stool. You may have this test every year starting at age 72.  Flexible sigmoidoscopy or colonoscopy. You may have a sigmoidoscopy every 5 years or a colonoscopy every 10 years starting at age 61.  Prostate cancer screening. Recommendations will vary depending on your family history and other risks.  Hepatitis C blood test.  Hepatitis B blood test.  Sexually transmitted disease (STD) testing.  Diabetes screening. This is done by checking your blood sugar (glucose) after you have not eaten for a while (fasting). You may have this done every 1-3 years.  Abdominal aortic aneurysm (AAA) screening. You may need this if you are a current or former smoker.  Osteoporosis. You may be screened starting at age 52 if you are at high risk. Talk with your health care provider about  your test results, treatment options, and if necessary, the need for more tests. Vaccines  Your health care provider may recommend certain vaccines, such as:  Influenza vaccine. This is recommended every year.  Tetanus, diphtheria, and acellular pertussis (Tdap, Td) vaccine. You may need a Td  booster every 10 years.  Zoster vaccine. You may need this after age 24.  Pneumococcal 13-valent conjugate (PCV13) vaccine. One dose is recommended after age 72.  Pneumococcal polysaccharide (PPSV23) vaccine. One dose is recommended after age 14. Talk to your health care provider about which screenings and vaccines you need and how often you need them. This information is not intended to replace advice given to you by your health care provider. Make sure you discuss any questions you have with your health care provider. Document Released: 06/07/2015 Document Revised: 01/29/2016 Document Reviewed: 03/12/2015 Elsevier Interactive Patient Education  2017 [place] Prevention in the Home Falls can cause injuries. They can happen to people of all ages. There are many things you can do to make your home safe and to help prevent falls. What can I do on the outside of my home?  Regularly fix the edges of walkways and driveways and fix any cracks.  Remove anything that might make you trip as you walk through a door, such as a raised step or threshold.  Trim any bushes or trees on the path to your home.  Use bright outdoor lighting.  Clear any walking paths of anything that might make someone trip, such as rocks or tools.  Regularly check to see if handrails are loose or broken. Make sure that both sides of any steps have handrails.  Any raised decks and porches should have guardrails on the edges.  Have any leaves, snow, or ice cleared regularly.  Use sand or salt on walking paths during winter.  Clean up any spills in your garage right away. This includes oil or grease spills. What can I do in the bathroom?  Use night lights.  Install grab bars by the toilet and in the tub and shower. Do not use towel bars as grab bars.  Use non-skid mats or decals in the tub or shower.  If you need to sit down in the shower, use a plastic, non-slip stool.  Keep the floor dry. Clean up  any water that spills on the floor as soon as it happens.  Remove soap buildup in the tub or shower regularly.  Attach bath mats securely with double-sided non-slip rug tape.  Do not have throw rugs and other things on the floor that can make you trip. What can I do in the bedroom?  Use night lights.  Make sure that you have a light by your bed that is easy to reach.  Do not use any sheets or blankets that are too big for your bed. They should not hang down onto the floor.  Have a firm chair that has side arms. You can use this for support while you get dressed.  Do not have throw rugs and other things on the floor that can make you trip. What can I do in the kitchen?  Clean up any spills right away.  Avoid walking on wet floors.  Keep items that you use a lot in easy-to-reach places.  If you need to reach something above you, use a strong step stool that has a grab bar.  Keep electrical cords out of the way.  Do not use floor polish or wax  that makes floors slippery. If you must use wax, use non-skid floor wax.  Do not have throw rugs and other things on the floor that can make you trip. What can I do with my stairs?  Do not leave any items on the stairs.  Make sure that there are handrails on both sides of the stairs and use them. Fix handrails that are broken or loose. Make sure that handrails are as long as the stairways.  Check any carpeting to make sure that it is firmly attached to the stairs. Fix any carpet that is loose or worn.  Avoid having throw rugs at the top or bottom of the stairs. If you do have throw rugs, attach them to the floor with carpet tape.  Make sure that you have a light switch at the top of the stairs and the bottom of the stairs. If you do not have them, ask someone to add them for you. What else can I do to help prevent falls?  Wear shoes that:  Do not have high heels.  Have rubber bottoms.  Are comfortable and fit you well.  Are  closed at the toe. Do not wear sandals.  If you use a stepladder:  Make sure that it is fully opened. Do not climb a closed stepladder.  Make sure that both sides of the stepladder are locked into place.  Ask someone to hold it for you, if possible.  Clearly mark and make sure that you can see:  Any grab bars or handrails.  First and last steps.  Where the edge of each step is.  Use tools that help you move around (mobility aids) if they are needed. These include:  Canes.  Walkers.  Scooters.  Crutches.  Turn on the lights when you go into a dark area. Replace any light bulbs as soon as they burn out.  Set up your furniture so you have a clear path. Avoid moving your furniture around.  If any of your floors are uneven, fix them.  If there are any pets around you, be aware of where they are.  Review your medicines with your doctor. Some medicines can make you feel dizzy. This can increase your chance of falling. Ask your doctor what other things that you can do to help prevent falls. This information is not intended to replace advice given to you by your health care provider. Make sure you discuss any questions you have with your health care provider. Document Released: 03/07/2009 Document Revised: 10/17/2015 Document Reviewed: 06/15/2014 Elsevier Interactive Patient Education  2017 [employer].

## 2019-01-03 ENCOUNTER — Ambulatory Visit: Payer: Medicare Other

## 2019-01-03 ENCOUNTER — Encounter: Payer: Medicare Other | Admitting: Family Medicine

## 2019-01-10 ENCOUNTER — Other Ambulatory Visit: Payer: Self-pay | Admitting: Family Medicine

## 2019-01-10 DIAGNOSIS — M5136 Other intervertebral disc degeneration, lumbar region: Secondary | ICD-10-CM

## 2019-01-10 NOTE — Telephone Encounter (Signed)
Pt needing a refill on:  oxyCODONE (ROXICODONE) 15 MG immediate release tablet - Please fill Friday or Sat.  Please fill at:  [place], [place] - [place] [phone] (Phone) [phone] (Fax)   Thanks, [facility]

## 2019-01-12 ENCOUNTER — Other Ambulatory Visit: Payer: Self-pay | Admitting: Family Medicine

## 2019-01-12 DIAGNOSIS — M5136 Other intervertebral disc degeneration, lumbar region: Secondary | ICD-10-CM

## 2019-01-31 ENCOUNTER — Telehealth: Payer: Self-pay | Admitting: Family Medicine

## 2019-01-31 DIAGNOSIS — M5136 Other intervertebral disc degeneration, lumbar region: Secondary | ICD-10-CM

## 2019-01-31 MED ORDER — OXYCODONE HCL 15 MG PO TABS: 15.0000 mg | ORAL_TABLET | ORAL | 0 refills | Status: AC | PRN
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
## 2019-02-07 ENCOUNTER — Other Ambulatory Visit: Payer: Self-pay

## 2019-02-07 ENCOUNTER — Encounter: Payer: Self-pay | Admitting: Family Medicine

## 2019-02-07 ENCOUNTER — Ambulatory Visit (INDEPENDENT_AMBULATORY_CARE_PROVIDER_SITE_OTHER): Payer: Medicare Other | Admitting: Family Medicine

## 2019-02-07 VITALS — BP 120/62 | HR 56 | Temp 97.1°F | Resp 15 | Wt 180.2 lb

## 2019-02-07 DIAGNOSIS — E559 Vitamin D deficiency, unspecified: Secondary | ICD-10-CM | POA: Diagnosis not present

## 2019-02-07 DIAGNOSIS — Z23 Encounter for immunization: Secondary | ICD-10-CM | POA: Diagnosis not present

## 2019-02-07 DIAGNOSIS — D649 Anemia, unspecified: Secondary | ICD-10-CM | POA: Diagnosis not present

## 2019-02-07 DIAGNOSIS — I2510 Atherosclerotic heart disease of native coronary artery without angina pectoris: Secondary | ICD-10-CM

## 2019-02-07 DIAGNOSIS — E790 Hyperuricemia without signs of inflammatory arthritis and tophaceous disease: Secondary | ICD-10-CM | POA: Diagnosis not present

## 2019-02-07 DIAGNOSIS — I272 Pulmonary hypertension, unspecified: Secondary | ICD-10-CM

## 2019-02-07 DIAGNOSIS — Z0000 Encounter for general adult medical examination without abnormal findings: Secondary | ICD-10-CM

## 2019-02-07 NOTE — Progress Notes (Signed)
Patient: Vincent Mason, Male    DOB: 07/16/1935, 83 y.o.   MRN: [id number] Visit Date: 02/10/2019  Today's Provider: Lelon Huh, MD   Chief Complaint  Patient presents with  . Annual Exam   Subjective:     Complete Physical Vincent Mason is a 83 y.o. male. He feels fairly well. He reports exercising rarely. He reports he is sleeping fairly well.  -----------------------------------------------------------   Review of Systems  Constitutional: Negative for appetite change, chills and fever.  Respiratory: Negative for chest tightness, shortness of breath and wheezing.   Cardiovascular: Negative for chest pain and palpitations.  Gastrointestinal: Negative for abdominal pain, nausea and vomiting.    Social History   Socioeconomic History  . Marital status: Married    Spouse name: Not on file  . Number of children: 3  . Years of education: some colle  . Highest education level: Some college, no degree  Occupational History  . Occupation: Retired  Scientific laboratory technician  . Financial resource strain: Not hard at all  . Food insecurity    Worry: Never true    Inability: Never true  . Transportation needs    Medical: No    Non-medical: No  Tobacco Use  . Smoking status: Former Research scientist (life sciences)  . Smokeless tobacco: Never Used  . Tobacco comment: Quit in 1990  Substance and Sexual Activity  . Alcohol use: No  . Drug use: No  . Sexual activity: Yes  Lifestyle  . Physical activity    Days per week: 0 days    Minutes per session: 0 min  . Stress: Not at all  Relationships  . Social Herbalist on phone: Patient refused    Gets together: Patient refused    Attends religious service: Patient refused    Active member of club or organization: Patient refused    Attends meetings of clubs or organizations: Patient refused    Relationship status: Patient refused  . Intimate partner violence    Fear of current or ex partner: Patient refused    Emotionally abused: Patient  refused    Physically abused: Patient refused    Forced sexual activity: Patient refused  Other Topics Concern  . Not on file  Social History Narrative  . Not on file    Past Medical History:  Diagnosis Date  . Acute encephalopathy 09/03/2015  . Atrial fibrillation and flutter [facility]) 2013   Dr Nehemiah Massed at [facility]  on [address].   . C. difficile colitis 09/12/2015  . COPD (chronic obstructive pulmonary disease) ([place])   . HCAP (healthcare-associated pneumonia) 09/12/2015  . Hyperlipidemia   . Hypertension   . Kidney disease, chronic, stage III (moderate, EGFR 30-59 ml/min) ([place])    ARF 2013  . OSA (obstructive sleep apnea)    non-compliant with CPAP.   Marland Kitchen Severe sepsis with septic shock ([place]) 09/12/2015     Patient Active Problem List   Diagnosis Date Noted  . Restless leg syndrome 06/22/2018  . Erectile dysfunction due to arterial insufficiency 05/04/2018  . Biliary cirrhosis ([place]) 02/21/2018  . Seizure ([place]) 08/06/2017  . Vitamin D deficiency 04/08/2017  . Neuropathy 04/08/2017  . Altered mental status 01/21/2017  . Blood in stool   . Anemia due to chronic blood loss   . Hypomagnesemia 09/14/2015  . Chronic atrial fibrillation 09/12/2015  . Essential hypertension 09/12/2015  . Fecal occult blood test positive 09/12/2015  . Nodular hyperplasia of liver   . Normocytic  anemia 09/03/2015  . Opioid dependence ([place]) 09/03/2015  . Hypokalemia 08/26/2015  . Hyperuricemia 07/09/2015  . Coronary artery disease 01/22/2015  . Chronic kidney disease (CKD), stage III (moderate) ([place]) 01/22/2015  . Chronic constipation 01/22/2015  . COPD (chronic obstructive pulmonary disease) ([place]) 01/22/2015  . ED (erectile dysfunction) of organic origin 01/22/2015  . Lymphedema of both lower extremities 01/22/2015  . Fatigue 01/22/2015  . Blood in the urine 01/22/2015  . Calculus of kidney 01/22/2015  . Memory loss 01/22/2015  . Hypertensive pulmonary vascular disease ([place]) 01/22/2015  .  Displacement of cervical intervertebral disc without myelopathy 01/22/2015  . Seizure disorder ([place]) 01/17/2015  . Obstructive sleep apnea 01/17/2015  . Hyperlipidemia 01/17/2015  . Leg swelling 01/17/2015  . Degenerative disc disease, lumbar 01/17/2015  . Back pain 11/12/2014  . TI (tricuspid incompetence) 08/02/2014    Past Surgical History:  Procedure Laterality Date  . CATARACT EXTRACTION    . CORONARY ARTERY BYPASS GRAFT  10/1997  . history of cervical discectomy  2009   C5-C6  . Hyperplastic colon polyp  02/2002   Sigmoid polyps    His family history includes Hypertension in his father and mother.   Current Outpatient Medications:  .  apixaban (ELIQUIS) 5 MG TABS tablet, Take 1/2 tablet twice a day (Patient taking differently: 5 mg 2 (two) times daily. ), Disp: , Rfl:  .  aspirin EC 81 MG tablet, Take 81 mg by mouth daily., Disp: , Rfl:  .  Cholecalciferol (VITAMIN D3) 1000 units CAPS, Take by mouth daily., Disp: , Rfl:  .  digoxin (LANOXIN) 0.125 MG tablet, Take 0.125 mg by mouth daily., Disp: , Rfl:  .  ferrous sulfate 325 (65 FE) MG tablet, Take 1 tablet (325 mg total) by mouth daily., Disp: 60 tablet, Rfl: 3 .  levETIRAcetam (KEPPRA) 500 MG tablet, Take 1 tablet by mouth 2 (two) times daily., Disp: , Rfl:  .  metoprolol succinate (TOPROL-XL) 25 MG 24 hr tablet, TAKE HALF A TABLET BY MOUTH DAILY, Disp: 30 tablet, Rfl: 12 .  Multiple Vitamins-Minerals (ALIVE MENS ENERGY) TABS, Take 1 tablet by mouth daily., Disp: , Rfl:  .  oxyCODONE (ROXICODONE) 15 MG immediate release tablet, TAKE 1 TABLET BY MOUTH EVERY 6 HOURS AS NEEDED FOR PAIN, Disp: 120 tablet, Rfl: 0 .  oxyCODONE (ROXICODONE) 15 MG immediate release tablet, Take 1 tablet (15 mg total) by mouth every 4 (four) hours as needed for up to 12 days for pain., Disp: 48 tablet, Rfl: 0 .  polyethylene glycol (MIRALAX / GLYCOLAX) packet, Take 17 g by mouth daily., Disp: 14 each, Rfl: 0 .  spironolactone (ALDACTONE) 25 MG  tablet, Take 0.5 tablets (12.5 mg total) by mouth daily. (Patient taking differently: Take 25 mg by mouth daily. ), Disp: , Rfl:  .  torsemide (DEMADEX) 20 MG tablet, Take one daily as needed for excessive swelling, Disp: , Rfl:  .  allopurinol (ZYLOPRIM) 100 MG tablet, Take 1 tablet (100 mg total) by mouth 2 (two) times daily. TAKE IN PLACE OF FEBUXOSTAT, Disp: 180 tablet, Rfl: 3 .  divalproex (DEPAKOTE) 250 MG DR tablet, Take 1 tablet by mouth 2 (two) times daily., Disp: , Rfl:  .  gabapentin (NEURONTIN) 300 MG capsule, Take 1 capsule (300 mg total) by mouth 2 (two) times daily. (Patient not taking: Reported on 12/22/2018), Disp: 60 capsule, Rfl: 1 .  NONFORMULARY OR COMPOUNDED ITEM, Trimix (30/1/10)-(Pap/Phent/PGE) Dosage: 14m test dose vial 3cc size vial Qty1 Refills0 [facility] [phone] Fax [phone] (Patient not taking: Reported on 12/22/2018), Disp: 1 each, Rfl: 0 .  rosuvastatin (CRESTOR) 10 MG tablet, Take 1 tablet (10 mg total) by mouth daily., Disp: 90 tablet, Rfl: 3  Patient Care Team: Birdie Sons, MD as PCP - General (Family Medicine) Corey Skains, MD as Consulting Physician (Internal Medicine) Dingeldein, Remo Lipps, MD as Consulting Physician (Ophthalmology) Murlean Iba, MD as Consulting Physician (Internal Medicine) Anabel Bene, MD as Referring Physician (Neurology)     Objective:    Vitals: BP 120/62   Pulse (!) 56   Temp (!) 97.1 F (36.2 C) (Oral)   Resp 15   Wt 180 lb 3.2 oz (81.7 kg)   SpO2 90%   BMI 25.86 kg/m   Physical Exam  General Appearance:    Alert, cooperative, no distress, appears stated age  Head:    Normocephalic, without obvious abnormality, atraumatic  Eyes:    PERRL, conjunctiva/corneas clear, EOM's intact, fundi    benign, both eyes       Ears:    Normal TM's and external ear canals, both ears  Nose:   Nares normal, septum midline, mucosa normal, no drainage   or sinus tenderness  Throat:   Lips, mucosa, and  tongue normal; teeth and gums normal  Neck:   Supple, symmetrical, trachea midline, no adenopathy;       thyroid:  No enlargement/tenderness/nodules; no carotid   bruit or JVD  Back:     Symmetric, no curvature, ROM normal, no CVA tenderness  Lungs:     Clear to auscultation bilaterally, respirations unlabored  Chest wall:    No tenderness or deformity  Heart:    Bradycardic. Irregularly irregular rhythm.  2/6 holosystolic murmur at left lower sternal border S1 and S2 normal  Abdomen:     Soft, non-tender, bowel sounds active all four quadrants,    no masses, no organomegaly  Genitalia:    deferred  Rectal:    deferred  Extremities:   All extremities are intact. No cyanosis. 2+ bilateral LE edema.   Pulses:   2+ and symmetric all extremities  Skin:   Skin color, texture, turgor normal, no rashes or lesions  Lymph nodes:   Cervical, supraclavicular, and axillary nodes normal  Neurologic:   CNII-XII intact. Normal strength, sensation and reflexes      throughout    Activities of Daily Living In your present state of health, do you have any difficulty performing the following activities: 12/22/2018  Hearing? Y  Comment Has hearing loss in right ear. Wears a hearing aid.  Vision? N  Difficulty concentrating or making decisions? N  Walking or climbing stairs? N  Dressing or bathing? N  Doing errands, shopping? N  Preparing Food and eating ? N  Using the Toilet? N  In the past six months, have you accidently leaked urine? N  Do you have problems with loss of bowel control? N  Managing your Medications? Y  Comment Wife manages medications.  Managing your Finances? N  Housekeeping or managing your Housekeeping? N  Comment Wife does house work.  Some recent data might be hidden    Fall Risk Assessment Fall Risk  12/22/2018 07/22/2018 10/22/2017 12/31/2016 10/14/2016  Falls in the past year? 0 1 No Yes Yes  Number falls in past yr: - 0 - 1 1  Comment - - - yesterday  -  Injury with  Fall? - 0 - No No  Follow up - - - -  Falls prevention discussed     Depression Screen PHQ 2/9 Scores 12/22/2018 07/22/2018 10/22/2017 12/31/2016  PHQ - 2 Score 0 0 0 0  PHQ- 9 Score - - - -    6CIT Screen 10/22/2017  What Year? 0 points  What month? 0 points  What time? 0 points  Count back from 20 0 points  Months in reverse 2 points  Repeat phrase 0 points  Total Score 2       Assessment & Plan:    Annual Physical Reviewed patient's Family Medical History Reviewed and updated list of patient's medical providers Assessment of cognitive impairment was done Assessed patient's functional ability Established a written schedule for health screening [place] Completed and Reviewed  Exercise Activities and Dietary recommendations Goals    . diet     Recommend cutting down on portion size with daily meals.     Marland Kitchen DIET - INCREASE WATER INTAKE     Recommend increasing water intake to 6 glasses a day.        Immunization History  Administered Date(s) Administered  . Fluad Quad(high Dose 65+) 02/07/2019  . Influenza, High Dose Seasonal PF 06/17/2016, 04/08/2017, 02/21/2018  . PPD Test 12/03/2016  . Pneumococcal Conjugate-13 02/06/2014  . Pneumococcal Polysaccharide-23 03/19/2003  . Zoster 02/06/2014    Health Maintenance  Topic Date Due  . TETANUS/TDAP  05/25/2026 (Originally 06/23/1954)  . INFLUENZA VACCINE  Completed  . PNA vac Low Risk Adult  Completed     Discussed health benefits of physical activity, and encouraged him to engage in regular exercise appropriate for his age and condition.    ------------------------------------------------------------------------------------------------------------  1. Annual physical exam   2. Hypertensive pulmonary vascular disease ([place]) Well controlled.  Continue current medications.    3. Normocytic anemia  - CBC  4. Hyperuricemia Currently off of allopurinol - Uric acid  5. Hypomagnesemia  -  Magnesium  6. Vitamin D deficiency  - VITAMIN D 25 Hydroxy (Vit-D Deficiency, Fractures)  7. Coronary artery disease involving native coronary artery of native heart without angina pectoris Asymptomatic. Compliant with medication.  Continue aggressive risk factor modification.   - Comprehensive metabolic panel - Lipid panel  8. Need for influenza vaccination  - Flu Vaccine QUAD High Dose(Fluad)     Lelon Huh, MD  [facility]

## 2019-02-08 ENCOUNTER — Telehealth: Payer: Self-pay

## 2019-02-08 DIAGNOSIS — E790 Hyperuricemia without signs of inflammatory arthritis and tophaceous disease: Secondary | ICD-10-CM

## 2019-02-08 DIAGNOSIS — E7800 Pure hypercholesterolemia, unspecified: Secondary | ICD-10-CM

## 2019-02-08 LAB — LIPID PANEL
Chol/HDL Ratio: 5.5 ratio — ABNORMAL HIGH (ref 0.0–5.0)
Cholesterol, Total: 215 mg/dL — ABNORMAL HIGH (ref 100–199)
HDL: 39 mg/dL — ABNORMAL LOW (ref >39)
LDL Chol Calc (NIH): 130 mg/dL — ABNORMAL HIGH (ref 0–99)
Triglycerides: 260 mg/dL — ABNORMAL HIGH (ref 0–149)
VLDL Cholesterol Cal: 46 mg/dL — ABNORMAL HIGH (ref 5–40)

## 2019-02-08 LAB — COMPREHENSIVE METABOLIC PANEL
ALT: 17 IU/L (ref 0–44)
AST: 19 IU/L (ref 0–40)
Albumin/Globulin Ratio: 2 (ref 1.2–2.2)
Albumin: 4.7 g/dL — ABNORMAL HIGH (ref 3.6–4.6)
Alkaline Phosphatase: 41 IU/L (ref 39–117)
BUN/Creatinine Ratio: 14 (ref 10–24)
BUN: 33 mg/dL — ABNORMAL HIGH (ref 8–27)
Bilirubin Total: 0.4 mg/dL (ref 0.0–1.2)
CO2: 28 mmol/L (ref 20–29)
Calcium: 9.5 mg/dL (ref 8.6–10.2)
Chloride: 100 mmol/L (ref 96–106)
Creatinine, Ser: 2.34 mg/dL — ABNORMAL HIGH (ref 0.76–1.27)
GFR calc Af Amer: 29 mL/min/{1.73_m2} — ABNORMAL LOW (ref >59)
GFR calc non Af Amer: 25 mL/min/{1.73_m2} — ABNORMAL LOW (ref >59)
Globulin, Total: 2.3 g/dL (ref 1.5–4.5)
Glucose: 90 mg/dL (ref 65–99)
Potassium: 4.8 mmol/L (ref 3.5–5.2)
Sodium: 144 mmol/L (ref 134–144)
Total Protein: 7 g/dL (ref 6.0–8.5)

## 2019-02-08 LAB — VITAMIN D 25 HYDROXY (VIT D DEFICIENCY, FRACTURES): Vit D, 25-Hydroxy: 39.4 ng/mL (ref 30.0–100.0)

## 2019-02-08 LAB — CBC
Hematocrit: 33.3 % — ABNORMAL LOW (ref 37.5–51.0)
Hemoglobin: 11.5 g/dL — ABNORMAL LOW (ref 13.0–17.7)
MCH: 36.9 pg — ABNORMAL HIGH (ref 26.6–33.0)
MCHC: 34.5 g/dL (ref 31.5–35.7)
MCV: 107 fL — ABNORMAL HIGH (ref 79–97)
Platelets: 225 10*3/uL (ref 150–450)
RBC: 3.12 x10E6/uL — ABNORMAL LOW (ref 4.14–5.80)
RDW: 13.7 % (ref 11.6–15.4)
WBC: 7.1 10*3/uL (ref 3.4–10.8)

## 2019-02-08 LAB — URIC ACID: Uric Acid: 11.3 mg/dL — ABNORMAL HIGH (ref 3.7–8.6)

## 2019-02-08 LAB — MAGNESIUM: Magnesium: 2.4 mg/dL — ABNORMAL HIGH (ref 1.6–2.3)

## 2019-02-08 MED ORDER — ROSUVASTATIN CALCIUM 10 MG PO TABS: 10.0000 mg | ORAL_TABLET | Freq: Every day | ORAL | 3 refills | Status: DC

## 2019-02-08 MED ORDER — ALLOPURINOL 100 MG PO TABS: 100.0000 mg | ORAL_TABLET | Freq: Two times a day (BID) | ORAL | 3 refills | Status: DC

## 2019-02-08 NOTE — Telephone Encounter (Signed)
Patient advised as directed below. 

## 2019-02-08 NOTE — Telephone Encounter (Signed)
-----   Message from Birdie Sons, MD sent at 02/08/2019  8:08 AM EDT ----- Uric acid is very high again. He needs to start back on allopurinol, 100mg  tablets, two tablets daily, #180 rf x 3.  LDL cholesterol is also much too high at 130, needs to be under 80. Need to start rosuvastatin 10mg  once a day, 90, rf x3. Please send prescriptions to pharmacy of his choice.  Have sent copy of labs to dr singh and dr. Nehemiah Massed.  Schedule follow up in 3 months.

## 2019-02-10 ENCOUNTER — Other Ambulatory Visit: Payer: Self-pay | Admitting: Family Medicine

## 2019-02-10 DIAGNOSIS — M5136 Other intervertebral disc degeneration, lumbar region: Secondary | ICD-10-CM

## 2019-02-10 MED ORDER — OXYCODONE HCL 15 MG PO TABS: 15.0000 mg | ORAL_TABLET | Freq: Four times a day (QID) | ORAL | 0 refills | Status: DC | PRN

## 2019-02-10 NOTE — Telephone Encounter (Signed)
Pt called asking for a refill on his Oxycodone 15mg   He will need this today since the pharmacy is closed on sat.  [place]

## 2019-02-10 NOTE — Telephone Encounter (Signed)
Please review. Thanks!  

## 2019-02-10 NOTE — Patient Instructions (Signed)
.   Please review the attached list of medications and notify my office if there are any errors.   . Please bring all of your medications to every appointment so we can make sure that our medication list is the same as yours.   . It is especially important to get the annual flu vaccine this year. If you haven't had it already, please go to your pharmacy or call the office as soon as possible to schedule you flu shot.  

## 2019-02-21 ENCOUNTER — Ambulatory Visit (INDEPENDENT_AMBULATORY_CARE_PROVIDER_SITE_OTHER): Payer: Medicare Other | Admitting: Family Medicine

## 2019-02-21 ENCOUNTER — Encounter: Payer: Self-pay | Admitting: Family Medicine

## 2019-02-21 ENCOUNTER — Telehealth: Payer: Self-pay | Admitting: *Deleted

## 2019-02-21 ENCOUNTER — Other Ambulatory Visit: Payer: Self-pay

## 2019-02-21 VITALS — BP 118/52 | HR 77 | Temp 97.1°F | Wt 181.0 lb

## 2019-02-21 DIAGNOSIS — K648 Other hemorrhoids: Secondary | ICD-10-CM | POA: Diagnosis not present

## 2019-02-21 MED ORDER — HYDROCORTISONE (PERIANAL) 2.5 % EX CREA: 1.0000 "application " | TOPICAL_CREAM | Freq: Two times a day (BID) | CUTANEOUS | 0 refills | Status: DC

## 2019-02-21 NOTE — Patient Instructions (Signed)

## 2019-02-21 NOTE — Progress Notes (Signed)
Vincent Mason  MRN: [id number] DOB: [birth year]  Subjective:  HPI   The patient is an 83 year old male who presents for evaluation of rectal bleeding.  He states it started a few days ago.  He reports having bright red blood with bowel movements and some without.  He said he notes there is a small knot just inside the rectum, and he feels a slight burning sensation.  Patient Active Problem List   Diagnosis Date Noted  . Restless leg syndrome 06/22/2018  . Erectile dysfunction due to arterial insufficiency 05/04/2018  . Biliary cirrhosis ([place]) 02/21/2018  . Seizure ([place]) 08/06/2017  . Vitamin D deficiency 04/08/2017  . Neuropathy 04/08/2017  . Altered mental status 01/21/2017  . Blood in stool   . Anemia due to chronic blood loss   . Hypomagnesemia 09/14/2015  . Chronic atrial fibrillation 09/12/2015  . Essential hypertension 09/12/2015  . Fecal occult blood test positive 09/12/2015  . Nodular hyperplasia of liver   . Normocytic anemia 09/03/2015  . Opioid dependence ([place]) 09/03/2015  . Hypokalemia 08/26/2015  . Hyperuricemia 07/09/2015  . Coronary artery disease 01/22/2015  . Chronic kidney disease (CKD), stage III (moderate) ([place]) 01/22/2015  . Chronic constipation 01/22/2015  . COPD (chronic obstructive pulmonary disease) ([place]) 01/22/2015  . ED (erectile dysfunction) of organic origin 01/22/2015  . Lymphedema of both lower extremities 01/22/2015  . Fatigue 01/22/2015  . Blood in the urine 01/22/2015  . Calculus of kidney 01/22/2015  . Memory loss 01/22/2015  . Hypertensive pulmonary vascular disease ([place]) 01/22/2015  . Displacement of cervical intervertebral disc without myelopathy 01/22/2015  . Seizure disorder ([place]) 01/17/2015  . Obstructive sleep apnea 01/17/2015  . Hyperlipidemia 01/17/2015  . Leg swelling 01/17/2015  . Degenerative disc disease, lumbar 01/17/2015  . Back pain 11/12/2014  . TI (tricuspid incompetence) 08/02/2014   Past Medical History:   Diagnosis Date  . Acute encephalopathy 09/03/2015  . Atrial fibrillation and flutter [facility]) 2013   Dr Nehemiah Massed at [facility]  on [address].   . C. difficile colitis 09/12/2015  . COPD (chronic obstructive pulmonary disease) ([place])   . HCAP (healthcare-associated pneumonia) 09/12/2015  . Hyperlipidemia   . Hypertension   . Kidney disease, chronic, stage III (moderate, EGFR 30-59 ml/min) ([place])    ARF 2013  . OSA (obstructive sleep apnea)    non-compliant with CPAP.   Marland Kitchen Severe sepsis with septic shock ([place]) 09/12/2015   Past Surgical History:  Procedure Laterality Date  . CATARACT EXTRACTION    . CORONARY ARTERY BYPASS GRAFT  10/1997  . history of cervical discectomy  2009   C5-C6  . Hyperplastic colon polyp  02/2002   Sigmoid polyps   Family History  Problem Relation Age of Onset  . Hypertension Mother   . Hypertension Father    Social History   Socioeconomic History  . Marital status: Married    Spouse name: Not on file  . Number of children: 3  . Years of education: some colle  . Highest education level: Some college, no degree  Occupational History  . Occupation: Retired  Scientific laboratory technician  . Financial resource strain: Not hard at all  . Food insecurity    Worry: Never true    Inability: Never true  . Transportation needs    Medical: No    Non-medical: No  Tobacco Use  . Smoking status: Former Research scientist (life sciences)  . Smokeless tobacco: Never Used  . Tobacco comment: Quit in 1990  Substance and Sexual Activity  .  Alcohol use: No  . Drug use: No  . Sexual activity: Yes  Lifestyle  . Physical activity    Days per week: 0 days    Minutes per session: 0 min  . Stress: Not at all  Relationships  . Social Herbalist on phone: Patient refused    Gets together: Patient refused    Attends religious service: Patient refused    Active member of club or organization: Patient refused    Attends meetings of clubs or organizations: Patient refused    Relationship status: Patient refused   . Intimate partner violence    Fear of current or ex partner: Patient refused    Emotionally abused: Patient refused    Physically abused: Patient refused    Forced sexual activity: Patient refused  Other Topics Concern  . Not on file  Social History Narrative  . Not on file   Patient states he does not think there has been any changes to his medications.  His wife takes care of this and he states he knows there are about a dozen or so.   Outpatient Encounter Medications as of 02/21/2019  Medication Sig  . allopurinol (ZYLOPRIM) 100 MG tablet Take 1 tablet (100 mg total) by mouth 2 (two) times daily. TAKE IN PLACE OF FEBUXOSTAT  . apixaban (ELIQUIS) 5 MG TABS tablet Take 1/2 tablet twice a day (Patient taking differently: 5 mg 2 (two) times daily. )  . aspirin EC 81 MG tablet Take 81 mg by mouth daily.  . Cholecalciferol (VITAMIN D3) 1000 units CAPS Take by mouth daily.  . digoxin (LANOXIN) 0.125 MG tablet Take 0.125 mg by mouth daily.  . ferrous sulfate 325 (65 FE) MG tablet Take 1 tablet (325 mg total) by mouth daily.  Marland Kitchen levETIRAcetam (KEPPRA) 500 MG tablet Take 1 tablet by mouth 2 (two) times daily.  . metoprolol succinate (TOPROL-XL) 25 MG 24 hr tablet TAKE HALF A TABLET BY MOUTH DAILY  . Multiple Vitamins-Minerals (ALIVE MENS ENERGY) TABS Take 1 tablet by mouth daily.  . NONFORMULARY OR COMPOUNDED ITEM Trimix (30/1/10)-(Pap/Phent/PGE) Dosage: 67m test dose vial 3cc size vial Qty1 [phone] [phone] . oxyCODONE (ROXICODONE) 15 MG immediate release tablet Take 1 tablet (15 mg total) by mouth every 6 (six) hours as needed. for pain  . polyethylene glycol (MIRALAX / GLYCOLAX) packet Take 17 g by mouth daily.  . rosuvastatin (CRESTOR) 10 MG tablet Take 1 tablet (10 mg total) by mouth daily.  .Marland Kitchenspironolactone (ALDACTONE) 25 MG tablet Take 0.5 tablets (12.5 mg total) by mouth daily. (Patient taking differently: Take 25 mg by mouth daily. )  .  torsemide (DEMADEX) 20 MG tablet Take one daily as needed for excessive swelling  . divalproex (DEPAKOTE) 250 MG DR tablet Take 1 tablet by mouth 2 (two) times daily.  .Marland Kitchengabapentin (NEURONTIN) 300 MG capsule Take 1 capsule (300 mg total) by mouth 2 (two) times daily. (Patient not taking: Reported on 12/22/2018)   No facility-administered encounter medications on file as of 02/21/2019.    Allergies  Allergen Reactions  . Decongestant  [Oxymetazoline]    Review of Systems  Constitutional: Negative for chills, diaphoresis, fever and malaise/fatigue.  HENT: Negative for congestion, ear pain and sore throat.   Respiratory: Negative for cough and shortness of breath.   Cardiovascular: Negative for chest pain.  Gastrointestinal: Positive for blood in stool. Negative for abdominal pain and diarrhea.    Objective:  BP (Marland Kitchen  118/52 (BP Location: Right Arm, Patient Position: Sitting, Cuff Size: Normal)   Pulse 77   Temp (!) 97.1 F (36.2 C) (Skin)   Wt 181 lb (82.1 kg)   SpO2 97%   BMI 25.97 kg/m  Wt Readings from Last 3 Encounters:  02/21/19 181 lb (82.1 kg)  02/07/19 180 lb 3.2 oz (81.7 kg)  07/22/18 193 lb 12.8 oz (87.9 kg)   Physical Exam  Constitutional: He is oriented to person, place, and time and well-developed, well-nourished, and in no distress.  HENT:  Head: Normocephalic.  Eyes: Conjunctivae are normal.  Neck: Neck supple.  Pulmonary/Chest: Effort normal.  Abdominal: Soft.  Genitourinary: Rectum:     Guaiac result positive.     Genitourinary Comments: Small hemorrhoid at 5 o'clock position near entrance of anus. Hemoccult slide questionably positive for blood.   Musculoskeletal: Normal range of motion.  Neurological: He is alert and oriented to person, place, and time.  Skin: No rash noted.  Psychiatric: Mood, affect and judgment normal.    Assessment and Plan :  1. Internal hemorrhoid R[place]y hemorrhoid at the 5 o'clock position without bright red blood at the present.  No pain. Has had a small lump sensation with bright red blood on tissue the past few days. No constipation or diarrhea. Some black stool he associates with long term iron supplement usage. Will treat with Proctocream BID and should recheck if no better in 5-6 days. History of Hgb 11.5 2 weeks ago and a history of anemia. - hydrocortisone (ANUSOL-HC) 2.5 % rectal cream; Place 1 application rectally 2 (two) times daily.  Dispense: 30 g; Refill: 0  .

## 2019-02-21 NOTE — Telephone Encounter (Signed)
Patient called office to schedule an appt for rectum bleeding. Patient states bleeding is minor however it is enough that he is seeing spots in his underwear. Patient also states he sees small amount of blood when he has a bowel movement. Patient states he also has a stinging sensation in his rectum. Schedule pt an appt with Simona Huh for this afternoon at 2:20pm.

## 2019-03-06 ENCOUNTER — Telehealth: Payer: Self-pay

## 2019-03-06 DIAGNOSIS — M5136 Other intervertebral disc degeneration, lumbar region: Secondary | ICD-10-CM

## 2019-03-06 NOTE — Telephone Encounter (Signed)
Please review. KW 

## 2019-03-06 NOTE — Telephone Encounter (Signed)
Patient calling requesting medication refill on the following medication: oxyCODONE (ROXICODONE) 15 MG immediate release tablet   Par patient is due Saturday but he would like to get it Friday please.  Pharmacy; [facility]

## 2019-03-10 MED ORDER — OXYCODONE HCL 15 MG PO TABS: 15.0000 mg | ORAL_TABLET | Freq: Four times a day (QID) | ORAL | 0 refills | Status: DC | PRN

## 2019-03-10 NOTE — Addendum Note (Signed)
Addended by: Birdie Sons on: 03/10/2019 12:23 PM   Modules accepted: Orders

## 2019-03-15 DIAGNOSIS — R600 Localized edema: Secondary | ICD-10-CM | POA: Diagnosis not present

## 2019-03-15 DIAGNOSIS — I25810 Atherosclerosis of coronary artery bypass graft(s) without angina pectoris: Secondary | ICD-10-CM | POA: Diagnosis not present

## 2019-03-15 DIAGNOSIS — I482 Chronic atrial fibrillation, unspecified: Secondary | ICD-10-CM | POA: Diagnosis not present

## 2019-03-15 DIAGNOSIS — I10 Essential (primary) hypertension: Secondary | ICD-10-CM | POA: Diagnosis not present

## 2019-03-15 DIAGNOSIS — E782 Mixed hyperlipidemia: Secondary | ICD-10-CM | POA: Diagnosis not present

## 2019-03-20 ENCOUNTER — Other Ambulatory Visit: Payer: Self-pay | Admitting: Family Medicine

## 2019-03-20 DIAGNOSIS — M5136 Other intervertebral disc degeneration, lumbar region: Secondary | ICD-10-CM

## 2019-03-20 NOTE — Telephone Encounter (Signed)
Last OV was 02/07/2019 and last refill 03/10/2019

## 2019-03-20 NOTE — Telephone Encounter (Signed)
Pt called needing a refill on  Oxycodone 15 mg  [facility]

## 2019-03-20 NOTE — Telephone Encounter (Signed)
Patient advised. He states is is aware he requested refill early. He just wanted to call early.

## 2019-03-20 NOTE — Telephone Encounter (Signed)
Please advise patient he is not due for refill until 04-09-2019

## 2019-04-03 ENCOUNTER — Other Ambulatory Visit: Payer: Self-pay | Admitting: Family Medicine

## 2019-04-03 ENCOUNTER — Other Ambulatory Visit: Payer: Self-pay

## 2019-04-03 DIAGNOSIS — M5136 Other intervertebral disc degeneration, lumbar region: Secondary | ICD-10-CM

## 2019-04-03 NOTE — Telephone Encounter (Signed)
Patient is requesting a refill on his Oxycodone 15 MG. Patient states that he does not need the medication right now but will be out by Friday and would need a refill.Please advise.

## 2019-04-03 NOTE — Telephone Encounter (Signed)
NEXT DISPENSE DUE 04-09-2019

## 2019-04-06 MED ORDER — OXYCODONE HCL 15 MG PO TABS: 15.0000 mg | ORAL_TABLET | Freq: Four times a day (QID) | ORAL | 0 refills | Status: DC | PRN

## 2019-04-27 ENCOUNTER — Other Ambulatory Visit: Payer: Self-pay | Admitting: Family Medicine

## 2019-04-27 DIAGNOSIS — M5136 Other intervertebral disc degeneration, lumbar region: Secondary | ICD-10-CM

## 2019-04-27 NOTE — Telephone Encounter (Signed)
Pt need refill on oxycodone. [facility]

## 2019-04-27 NOTE — Telephone Encounter (Signed)
Please see Dr Maralyn Sago note about notifying the patient.  This should not have been sent to me.

## 2019-04-27 NOTE — Telephone Encounter (Signed)
Requested medication (s) are due for refill today: yes  Requested medication (s) are on the active medication list: yes  Last refill:  04/06/2019  Future visit scheduled:yes  Notes to clinic:  Refill cannot be delegated    Requested Prescriptions  Pending Prescriptions Disp Refills   oxyCODONE (ROXICODONE) 15 MG immediate release tablet [Pharmacy Med Name: OXYCODONE HCL 15 MG TAB] 120 tablet     Sig: TAKE 1 TABLET BY MOUTH EVERY 6 HOURS AS NEEDED FOR PAIN     Not Delegated - Analgesics:  Opioid Agonists Failed - 04/27/2019 11:12 AM      Failed - This refill cannot be delegated      Failed - Urine Drug Screen completed in last 360 days.      Passed - Valid encounter within last 6 months    Recent Outpatient Visits          2 months ago Internal hemorrhoid   [place], [place]   2 months ago Annual physical exam   [facility] Birdie Sons, MD   9 months ago [facility])   [facility] Birdie Sons, MD   10 months ago Chronic kidney disease (CKD), stage III (moderate) [facility])   [facility] Birdie Sons, MD   10 months ago Restless leg syndrome   [facility] Birdie Sons, MD      Future Appointments            In 2 weeks Fisher, Kirstie Peri, MD [facility], [place]

## 2019-04-27 NOTE — Telephone Encounter (Signed)
Please advise his next refill is not due until 05-06-2019

## 2019-04-27 NOTE — Telephone Encounter (Signed)
From [facility] 

## 2019-05-01 NOTE — Telephone Encounter (Signed)
From [facility] 

## 2019-05-01 NOTE — Telephone Encounter (Signed)
I don't understand what the question is

## 2019-05-01 NOTE — Telephone Encounter (Signed)
Patient would like to talk to Dr. Caryn Section CMA about his medication refill. He says that he can not get it from the pharmacy and that it doesn't sound right that it was send on 04/27/19. Please call patient back, thanks.

## 2019-05-02 NOTE — Telephone Encounter (Signed)
Patient states he spoke with the pharmacy and found out when he could pick up his medication.

## 2019-05-12 ENCOUNTER — Ambulatory Visit (INDEPENDENT_AMBULATORY_CARE_PROVIDER_SITE_OTHER): Payer: Medicare Other | Admitting: Family Medicine

## 2019-05-12 ENCOUNTER — Encounter: Payer: Self-pay | Admitting: Family Medicine

## 2019-05-12 ENCOUNTER — Other Ambulatory Visit: Payer: Self-pay

## 2019-05-12 VITALS — BP 95/58 | HR 56 | Temp 97.1°F | Resp 16 | Wt 184.2 lb

## 2019-05-12 DIAGNOSIS — R569 Unspecified convulsions: Secondary | ICD-10-CM

## 2019-05-12 DIAGNOSIS — I10 Essential (primary) hypertension: Secondary | ICD-10-CM | POA: Diagnosis not present

## 2019-05-12 DIAGNOSIS — E785 Hyperlipidemia, unspecified: Secondary | ICD-10-CM | POA: Diagnosis not present

## 2019-05-12 DIAGNOSIS — F1120 Opioid dependence, uncomplicated: Secondary | ICD-10-CM

## 2019-05-12 DIAGNOSIS — I482 Chronic atrial fibrillation, unspecified: Secondary | ICD-10-CM

## 2019-05-12 DIAGNOSIS — E790 Hyperuricemia without signs of inflammatory arthritis and tophaceous disease: Secondary | ICD-10-CM | POA: Diagnosis not present

## 2019-05-12 NOTE — Progress Notes (Signed)
Patient: Vincent Mason Male    DOB: March 04, 1936   83 y.o.   MRN: [id number] Visit Date: 05/12/2019  Today's Provider: Lelon Huh, MD   Chief Complaint  Patient presents with  . Follow-up   Subjective:     HPI  Follow up for hypertensive pulmonary vascular disease  The patient was last seen for this 6 months ago. Changes made at last visit include no changes.  He reports good compliance with treatment. Patient reports that he is not sure what medications he is taking. Patient reports that his wife takes care of his medications.  He feels that condition is Unchanged. He is not having side effects.   BP Readings from Last 3 Encounters:  05/12/19 (!) 95/58  02/21/19 (!) 118/52  02/07/19 120/62   Follow up hyperuricemia. Was previously on Uloric but was taken off due to cardiac risk and uric increased Lab Results  Component Value Date   LABURIC 11.3 (H) 02/07/2019   Started on allopurinol after labs done in September.   Started on rosuvastatin after last lipid check Lab Results  Component Value Date   CHOL 215 (H) 02/07/2019   HDL 39 (L) 02/07/2019   LDLCALC 130 (H) 02/07/2019   TRIG 260 (H) 02/07/2019   CHOLHDL 5.5 (H) 02/07/2019    Continue on keppra for history of seizures reporits no seizures for several yearly.  continues eliquis for a-fib with no adverse effects.   ------------------------------------------------------------------------------------   Allergies  Allergen Reactions  . Decongestant  [Oxymetazoline]      Current Outpatient Medications:  .  allopurinol (ZYLOPRIM) 100 MG tablet, Take 1 tablet (100 mg total) by mouth 2 (two) times daily. TAKE IN PLACE OF FEBUXOSTAT, Disp: 180 tablet, Rfl: 3 .  apixaban (ELIQUIS) 5 MG TABS tablet, Take 1/2 tablet twice a day (Patient taking differently: 5 mg 2 (two) times daily. ), Disp: , Rfl:  .  aspirin EC 81 MG tablet, Take 81 mg by mouth daily., Disp: , Rfl:  .  Cholecalciferol (VITAMIN D3) 1000  units CAPS, Take by mouth daily., Disp: , Rfl:  .  digoxin (LANOXIN) 0.125 MG tablet, Take 0.125 mg by mouth daily., Disp: , Rfl:  .  divalproex (DEPAKOTE) 250 MG DR tablet, Take 1 tablet by mouth 2 (two) times daily., Disp: , Rfl:  .  ferrous sulfate 325 (65 FE) MG tablet, Take 1 tablet (325 mg total) by mouth daily., Disp: 60 tablet, Rfl: 3 .  gabapentin (NEURONTIN) 300 MG capsule, Take 1 capsule (300 mg total) by mouth 2 (two) times daily. (Patient not taking: Reported on 12/22/2018), Disp: 60 capsule, Rfl: 1 .  hydrocortisone (ANUSOL-HC) 2.5 % rectal cream, Place 1 application rectally 2 (two) times daily., Disp: 30 g, Rfl: 0 .  levETIRAcetam (KEPPRA) 500 MG tablet, Take 1 tablet by mouth 2 (two) times daily., Disp: , Rfl:  .  metoprolol succinate (TOPROL-XL) 25 MG 24 hr tablet, TAKE HALF A TABLET BY MOUTH DAILY, Disp: 30 tablet, Rfl: 12 .  Multiple Vitamins-Minerals (ALIVE MENS ENERGY) TABS, Take 1 tablet by mouth daily., Disp: , Rfl:  .  NONFORMULARY OR COMPOUNDED ITEM, Trimix (30/1/10)-(Pap/Phent/PGE) Dosage: 94ml test dose vial 3cc size vial Qty1 [place] [phone] Fax [phone], Disp: 1 each, Rfl: 0 .  oxyCODONE (ROXICODONE) 15 MG immediate release tablet, TAKE 1 TABLET BY MOUTH EVERY 6 HOURS AS NEEDED FOR PAIN, Disp: 120 tablet, Rfl: 0 .  polyethylene glycol (MIRALAX / GLYCOLAX) packet,  Take 17 g by mouth daily., Disp: 14 each, Rfl: 0 .  rosuvastatin (CRESTOR) 10 MG tablet, Take 1 tablet (10 mg total) by mouth daily., Disp: 90 tablet, Rfl: 3 .  spironolactone (ALDACTONE) 25 MG tablet, Take 0.5 tablets (12.5 mg total) by mouth daily. (Patient taking differently: Take 25 mg by mouth daily. ), Disp: , Rfl:  .  torsemide (DEMADEX) 20 MG tablet, Take one daily as needed for excessive swelling, Disp: , Rfl:   Review of Systems  Constitutional: Negative.   Respiratory: Negative.   Cardiovascular: Negative.     Social History   Tobacco Use  . Smoking status: Former  Research scientist (life sciences)  . Smokeless tobacco: Never Used  . Tobacco comment: Quit in 1990  Substance Use Topics  . Alcohol use: No      Objective:   BP (!) 95/58 (BP Location: Right Arm, Patient Position: Sitting, Cuff Size: Large)   Pulse (!) 56   Temp (!) 97.1 F (36.2 C) (Temporal)   Resp 16   Wt 184 lb 3.2 oz (83.6 kg)   BMI 26.43 kg/m  Vitals:   05/12/19 1422  BP: (!) 95/58  Pulse: (!) 56  Resp: 16  Temp: (!) 97.1 F (36.2 C)  TempSrc: Temporal  Weight: 184 lb 3.2 oz (83.6 kg)  Body mass index is 26.43 kg/m.   Physical Exam   General Appearance:    Well developed, well nourished male in no acute distress  Eyes:    PERRL, conjunctiva/corneas clear, EOM's intact       Lungs:     Clear to auscultation bilaterally, respirations unlabored  Heart:    Bradycardic. Irregularly irregular rhythm.  3/6 holosystolic murmur at left lower sternal border  MS:   All extremities are intact.   Neurologic:   Awake, alert, oriented x 3. No apparent focal neurological           defect.        No results found for any visits on 05/12/19.     Assessment & Plan    1. Essential hypertension Well controlled.  Continue current medications.    2. Hyperuricemia Doing well since initiation of allopurinol.  - Uric acid  3. Hyperlipidemia, unspecified hyperlipidemia type He is tolerating rosuvastatin well with no adverse effects.   - Comprehensive metabolic panel - Lipid panel (fasting)  4. Uncomplicated opioid dependence ([place]) Continue current medications.   5. Chronic atrial fibrillation (HCC) Rate well controlled on NOAC  6. Seizures ([place]) No seizures since being on current dose of Keppra.        The entirety of the information documented in the History of Present Illness, Review of Systems and Physical Exam were personally obtained by me. Portions of this information were initially documented by Lynford Humphrey, CMA and reviewed by me for thoroughness and accuracy.      Lelon Huh, MD  [facility]

## 2019-05-13 LAB — COMPREHENSIVE METABOLIC PANEL
ALT: 12 IU/L (ref 0–44)
AST: 20 IU/L (ref 0–40)
Albumin/Globulin Ratio: 2.3 — ABNORMAL HIGH (ref 1.2–2.2)
Albumin: 4.4 g/dL (ref 3.6–4.6)
Alkaline Phosphatase: 43 IU/L (ref 39–117)
BUN/Creatinine Ratio: 12 (ref 10–24)
BUN: 28 mg/dL — ABNORMAL HIGH (ref 8–27)
Bilirubin Total: 0.5 mg/dL (ref 0.0–1.2)
CO2: 25 mmol/L (ref 20–29)
Calcium: 8.9 mg/dL (ref 8.6–10.2)
Chloride: 103 mmol/L (ref 96–106)
Creatinine, Ser: 2.38 mg/dL — ABNORMAL HIGH (ref 0.76–1.27)
GFR calc Af Amer: 28 mL/min/{1.73_m2} — ABNORMAL LOW (ref >59)
GFR calc non Af Amer: 25 mL/min/{1.73_m2} — ABNORMAL LOW (ref >59)
Globulin, Total: 1.9 g/dL (ref 1.5–4.5)
Glucose: 96 mg/dL (ref 65–99)
Potassium: 4.5 mmol/L (ref 3.5–5.2)
Sodium: 145 mmol/L — ABNORMAL HIGH (ref 134–144)
Total Protein: 6.3 g/dL (ref 6.0–8.5)

## 2019-05-13 LAB — LIPID PANEL
Chol/HDL Ratio: 3.1 ratio (ref 0.0–5.0)
Cholesterol, Total: 110 mg/dL (ref 100–199)
HDL: 36 mg/dL — ABNORMAL LOW (ref >39)
LDL Chol Calc (NIH): 52 mg/dL (ref 0–99)
Triglycerides: 123 mg/dL (ref 0–149)
VLDL Cholesterol Cal: 22 mg/dL (ref 5–40)

## 2019-05-13 LAB — URIC ACID: Uric Acid: 7.2 mg/dL (ref 3.8–8.4)

## 2019-05-25 ENCOUNTER — Other Ambulatory Visit: Payer: Self-pay | Admitting: Family Medicine

## 2019-05-25 DIAGNOSIS — M5136 Other intervertebral disc degeneration, lumbar region: Secondary | ICD-10-CM

## 2019-05-25 NOTE — Telephone Encounter (Signed)
Copied from [place] [id number]. Topic: Quick Communication - Rx Refill/Question >> May 25, 2019 10:20 AM Vincent Mason wrote: Medication: oxyCODONE (ROXICODONE) 15 MG immediate release tablet (Patient would like Mason callback once medication has been sent to pharmacy. Patient stated that he is completely out.)  Has the patient contacted their pharmacy? Yes (Agent: If no, request that the patient contact the pharmacy for the refill.) (Agent: If yes, when and what did the pharmacy advise?)Contact PCP  Preferred Pharmacy (with phone number or street name):[facility] [place], [place]  Phone:  [phone] Fax:  [phone]     Agent: Please be advised that RX refills may take up to 3 business days. We ask that you follow-up with your pharmacy.

## 2019-05-25 NOTE — Telephone Encounter (Signed)
L.O.V. was on 05/12/2019 and upcoming appointment on 09/20/2019, please advise.

## 2019-05-25 NOTE — Telephone Encounter (Signed)
Requested medication (s) are due for refill today: yes  Requested medication (s) are on the active medication list: yes  Last refill:  04/27/2019  Future visit scheduled: yes  Notes to clinic:  This refill cannot be delegated    Requested Prescriptions  Pending Prescriptions Disp Refills   oxyCODONE (ROXICODONE) 15 MG immediate release tablet 120 tablet 0    Sig: Take 1 tablet (15 mg total) by mouth every 6 (six) hours as needed. for pain      Not Delegated - Analgesics:  Opioid Agonists Failed - 05/25/2019 10:37 AM      Failed - This refill cannot be delegated      Failed - Urine Drug Screen completed in last 360 days.      Passed - Valid encounter within last 6 months    Recent Outpatient Visits           1 week ago Essential hypertension   [facility] Birdie Sons, MD   3 months ago Internal hemorrhoid   [place], Vickki Muff, [place]   3 months ago Annual physical exam   [place] Birdie Sons, MD   10 months ago Chronic kidney disease (CKD), stage III (moderate) [facility])   [facility] Birdie Sons, MD   11 months ago Chronic kidney disease (CKD), stage III (moderate) [facility])   [place], Kirstie Peri, MD       Future Appointments             In 3 months Fisher, Kirstie Peri, MD [facility], [place]

## 2019-05-26 NOTE — Telephone Encounter (Signed)
Next dispense due 05-03-2019

## 2019-05-30 NOTE — Telephone Encounter (Signed)
Pt following up on refill request for  oxyCODONE (ROXICODONE) 15 MG immediate release tablet  Pt last filled 05/04/2019. Pt states he is completely out of his medication.    [place], [place] Phone:  [phone]  Fax:  [phone]

## 2019-06-27 ENCOUNTER — Other Ambulatory Visit: Payer: Self-pay

## 2019-06-27 ENCOUNTER — Ambulatory Visit (INDEPENDENT_AMBULATORY_CARE_PROVIDER_SITE_OTHER): Payer: Medicare Other | Admitting: Family Medicine

## 2019-06-27 VITALS — BP 130/64 | HR 112 | Temp 96.8°F | Wt 179.0 lb

## 2019-06-27 DIAGNOSIS — F1120 Opioid dependence, uncomplicated: Secondary | ICD-10-CM

## 2019-06-27 NOTE — Progress Notes (Signed)
Patient: Vincent Mason Male    DOB: [birth year]   84 y.o.   MRN: [id number] Visit Date: 06/27/2019  Today's Provider: Lelon Huh, MD   Chief Complaint  Patient presents with  . Pain   Subjective:     HPI   Patient has no complaints today. He states the the telephone operator told him he has to come in every month for an office visit to get oxycodone/apap refilled. Otherwise he wouldn't be here. He was just here at the end of December for a follow up and is upset that the operator would not accept his request for medication refill. He says he has enough to get by until 07-02-2019 which is a Sunday and wants to make sure he is able to  Get his prescription before then in case his pharmacy is closed.    Allergies  Allergen Reactions  . Decongestant  [Oxymetazoline]      Current Outpatient Medications:  .  allopurinol (ZYLOPRIM) 100 MG tablet, Take 1 tablet (100 mg total) by mouth 2 (two) times daily. TAKE IN PLACE OF FEBUXOSTAT, Disp: 180 tablet, Rfl: 3 .  apixaban (ELIQUIS) 5 MG TABS tablet, Take 1/2 tablet twice a day (Patient taking differently: 5 mg 2 (two) times daily. ), Disp: , Rfl:  .  aspirin EC 81 MG tablet, Take 81 mg by mouth daily., Disp: , Rfl:  .  Cholecalciferol (VITAMIN D3) 1000 units CAPS, Take by mouth daily., Disp: , Rfl:  .  digoxin (LANOXIN) 0.125 MG tablet, Take 0.125 mg by mouth daily., Disp: , Rfl:  .  ferrous sulfate 325 (65 FE) MG tablet, Take 1 tablet (325 mg total) by mouth daily., Disp: 60 tablet, Rfl: 3 .  hydrocortisone (ANUSOL-HC) 2.5 % rectal cream, Place 1 application rectally 2 (two) times daily., Disp: 30 g, Rfl: 0 .  levETIRAcetam (KEPPRA) 500 MG tablet, Take 1 tablet by mouth 2 (two) times daily., Disp: , Rfl:  .  metoprolol succinate (TOPROL-XL) 25 MG 24 hr tablet, TAKE HALF A TABLET BY MOUTH DAILY, Disp: 30 tablet, Rfl: 12 .  Multiple Vitamins-Minerals (ALIVE MENS ENERGY) TABS, Take 1 tablet by mouth daily., Disp: , Rfl:  .  NONFORMULARY  OR COMPOUNDED ITEM, Trimix (30/1/10)-(Pap/Phent/PGE) Dosage: 52ml test dose vial 3cc size vial Qty1 [place] [phone] Fax [phone], Disp: 1 each, Rfl: 0 .  oxyCODONE (ROXICODONE) 15 MG immediate release tablet, TAKE 1 TABLET BY MOUTH EVERY 6 HOURS AS NEEDED FOR PAIN, Disp: 120 tablet, Rfl: 0 .  polyethylene glycol (MIRALAX / GLYCOLAX) packet, Take 17 g by mouth daily., Disp: 14 each, Rfl: 0 .  rosuvastatin (CRESTOR) 10 MG tablet, Take 1 tablet (10 mg total) by mouth daily., Disp: 90 tablet, Rfl: 3 .  torsemide (DEMADEX) 20 MG tablet, Take one daily as needed for excessive swelling, Disp: , Rfl:  .  divalproex (DEPAKOTE) 250 MG DR tablet, Take 1 tablet by mouth 2 (two) times daily., Disp: , Rfl:  .  gabapentin (NEURONTIN) 300 MG capsule, Take 1 capsule (300 mg total) by mouth 2 (two) times daily. (Patient not taking: Reported on 12/22/2018), Disp: 60 capsule, Rfl: 1 .  spironolactone (ALDACTONE) 25 MG tablet, Take 0.5 tablets (12.5 mg total) by mouth daily. (Patient not taking: Reported on 06/27/2019), Disp: , Rfl:   Review of Systems  Constitutional: Negative for chills, fatigue and fever.  HENT: Negative for congestion, sinus pain and sore throat.   Respiratory: Negative for cough,  chest tightness and shortness of breath.   Cardiovascular: Negative for chest pain, palpitations and leg swelling.  Gastrointestinal: Negative for abdominal pain.  Musculoskeletal: Positive for back pain.  Neurological: Negative for headaches.    Social History   Tobacco Use  . Smoking status: Former Research scientist (life sciences)  . Smokeless tobacco: Never Used  . Tobacco comment: Quit in 1990  Substance Use Topics  . Alcohol use: No      Objective:   BP 130/64 (BP Location: Right Arm, Patient Position: Sitting, Cuff Size: Normal)   Pulse (!) 112   Temp (!) 96.8 F (36 C) (Skin)   Wt 179 lb (81.2 kg)   SpO2 94%   BMI 25.68 kg/m  Vitals:   06/27/19 0949  BP: 130/64  Pulse: (!) 112  Temp: (!)  96.8 F (36 C)  TempSrc: Skin  SpO2: 94%  Weight: 179 lb (81.2 kg)  Body mass index is 25.68 kg/m.   Physical Exam   No results found for any visits on 06/27/19.     Assessment & Plan    No visit. Patient should not have been required to make an in -office visit in order to have oxycodone/apap prescription renewed. Will send in refill at the end of this week. He has enough to get by until 2-7.     Lelon Huh, MD  [facility]

## 2019-06-30 ENCOUNTER — Other Ambulatory Visit: Payer: Self-pay | Admitting: Family Medicine

## 2019-06-30 DIAGNOSIS — M5136 Other intervertebral disc degeneration, lumbar region: Secondary | ICD-10-CM

## 2019-06-30 MED ORDER — OXYCODONE HCL 15 MG PO TABS: 15.0000 mg | ORAL_TABLET | Freq: Four times a day (QID) | ORAL | 0 refills | Status: DC | PRN

## 2019-07-08 ENCOUNTER — Ambulatory Visit: Payer: Medicare Other

## 2019-07-14 ENCOUNTER — Ambulatory Visit: Payer: Medicare Other

## 2019-07-21 ENCOUNTER — Ambulatory Visit: Payer: Medicare Other | Attending: Internal Medicine

## 2019-07-21 DIAGNOSIS — Z23 Encounter for immunization: Secondary | ICD-10-CM | POA: Insufficient documentation

## 2019-07-21 NOTE — Progress Notes (Signed)
   Covid-19 Vaccination Clinic  Name:  Vincent Mason    MRN: [id number] DOB: April 07, 1936  07/21/2019  [name] was observed post Covid-19 immunization for 15 minutes without incidence. He was provided with Vaccine Information Sheet and instruction to access the V-Safe system.   [name] was instructed to call 911 with any severe reactions post vaccine: Marland Kitchen Difficulty breathing  . Swelling of your face and throat  . A fast heartbeat  . A bad rash all over your body  . Dizziness and weakness    Immunizations Administered    Name Date Dose VIS Date Route   Pfizer COVID-19 Vaccine 07/21/2019  9:05 AM 0.3 mL 05/05/2019 Intramuscular   Manufacturer: [place]   Lot: [id number]   [place]: [id number]

## 2019-07-24 ENCOUNTER — Other Ambulatory Visit: Payer: Self-pay | Admitting: Family Medicine

## 2019-07-24 DIAGNOSIS — M5136 Other intervertebral disc degeneration, lumbar region: Secondary | ICD-10-CM

## 2019-07-24 NOTE — Telephone Encounter (Signed)
30 days dispensed 06/30/2019

## 2019-07-24 NOTE — Telephone Encounter (Signed)
Pt needs refill on his oxycodone 15 mg. [employer] [place]

## 2019-07-26 NOTE — Telephone Encounter (Signed)
Patient advised RX will not be due until 3/7 but since 3/7 is the weekend RX would possibly be sent to the pharmacy on 3/5. Patient verbalized understanding.

## 2019-07-26 NOTE — Telephone Encounter (Signed)
Patient called in checking on status of refill. Patient upset as to why it has not been refilled as of yet. Please advise.

## 2019-07-28 MED ORDER — OXYCODONE HCL 15 MG PO TABS: 15.0000 mg | ORAL_TABLET | Freq: Four times a day (QID) | ORAL | 0 refills | Status: DC | PRN

## 2019-08-15 ENCOUNTER — Ambulatory Visit: Payer: Medicare Other | Attending: Internal Medicine

## 2019-08-15 DIAGNOSIS — Z23 Encounter for immunization: Secondary | ICD-10-CM

## 2019-08-15 NOTE — Progress Notes (Signed)
   Covid-19 Vaccination Clinic  Name:  Vincent Mason    MRN: [id number] DOB: 07-31-1935  08/15/2019  Mr. Vincent Mason was observed post Covid-19 immunization for 15 minutes without incident. He was provided with Vaccine Information Sheet and instruction to access the V-Safe system.   Mr. Vincent Mason was instructed to call 911 with any severe reactions post vaccine: Marland Kitchen Difficulty breathing  . Swelling of face and throat  . A fast heartbeat  . A bad rash all over body  . Dizziness and weakness   Immunizations Administered    Name Date Dose VIS Date Route   Pfizer COVID-19 Vaccine 08/15/2019  1:39 PM 0.3 mL 05/05/2019 Intramuscular   Manufacturer: [employer]   Lot: [id number]   [place]: [id number]

## 2019-08-23 ENCOUNTER — Other Ambulatory Visit: Payer: Self-pay | Admitting: Family Medicine

## 2019-08-23 DIAGNOSIS — M5136 Other intervertebral disc degeneration, lumbar region: Secondary | ICD-10-CM

## 2019-08-23 NOTE — Telephone Encounter (Signed)
Medication Refill - Medication: oxyCODONE (ROXICODONE) 15 MG immediate release tablet Pt is about to run out / he asked if Dr. Caryn Section can refill this so he can pick up this Friday/ please advise   Has the patient contacted their pharmacy? No. (Agent: If no, request that the patient contact the pharmacy for the refill.) (Agent: If yes, when and what did the pharmacy advise?)  Preferred Pharmacy (with phone number or street name):  [place], [place] - [place] Phone:  [phone]  Fax:  [phone]       Agent: Please be advised that RX refills may take up to 3 business days. We ask that you follow-up with your pharmacy.

## 2019-08-24 MED ORDER — OXYCODONE HCL 15 MG PO TABS: 15.0000 mg | ORAL_TABLET | Freq: Four times a day (QID) | ORAL | 0 refills | Status: DC | PRN

## 2019-09-13 DIAGNOSIS — R600 Localized edema: Secondary | ICD-10-CM | POA: Diagnosis not present

## 2019-09-13 DIAGNOSIS — I482 Chronic atrial fibrillation, unspecified: Secondary | ICD-10-CM | POA: Diagnosis not present

## 2019-09-13 DIAGNOSIS — I10 Essential (primary) hypertension: Secondary | ICD-10-CM | POA: Diagnosis not present

## 2019-09-13 DIAGNOSIS — I25810 Atherosclerosis of coronary artery bypass graft(s) without angina pectoris: Secondary | ICD-10-CM | POA: Diagnosis not present

## 2019-09-13 DIAGNOSIS — E782 Mixed hyperlipidemia: Secondary | ICD-10-CM | POA: Diagnosis not present

## 2019-09-20 ENCOUNTER — Ambulatory Visit: Payer: Medicare Other | Admitting: Family Medicine

## 2019-09-20 ENCOUNTER — Other Ambulatory Visit: Payer: Self-pay | Admitting: Family Medicine

## 2019-09-20 DIAGNOSIS — M5136 Other intervertebral disc degeneration, lumbar region: Secondary | ICD-10-CM

## 2019-09-20 NOTE — Telephone Encounter (Signed)
Requested medication (s) are due for refill today: yes  Requested medication (s) are on the active medication list: yes  Last refill:  08/24/19  Future visit scheduled: yes  Notes to clinic:  not delegated   Requested Prescriptions  Pending Prescriptions Disp Refills   oxyCODONE (ROXICODONE) 15 MG immediate release tablet 120 tablet 0    Sig: Take 1 tablet (15 mg total) by mouth every 6 (six) hours as needed. for pain      Not Delegated - Analgesics:  Opioid Agonists Failed - 09/20/2019  9:02 AM      Failed - This refill cannot be delegated      Failed - Urine Drug Screen completed in last 360 days.      Passed - Valid encounter within last 6 months    Recent Outpatient Visits           2 months ago Uncomplicated opioid dependence [facility])   [facility] Birdie Sons, MD   4 months ago Essential hypertension   [facility] Birdie Sons, MD   7 months ago Internal hemorrhoid   [place], Vickki Muff, [place]   7 months ago Annual physical exam   [facility] Birdie Sons, MD   1 year ago Chronic kidney disease (CKD), stage III (moderate) [facility])   [place], Kirstie Peri, MD       Future Appointments             In 1 month Fisher, Kirstie Peri, MD [facility], [place]

## 2019-09-20 NOTE — Telephone Encounter (Signed)
Medication Refill - Medication:  oxyCODONE (ROXICODONE) 15 MG immediate release tablet   Has the patient contacted their pharmacy? Yes advised to call office. Patient is wanting script filled Friday as it is due Sunday and pharmacy is closed.   Preferred Pharmacy (with phone number or street name):  [place], [place] - [place] Phone:  [phone]  Fax:  [phone]       Agent: Please be advised that RX refills may take up to 3 business days. We ask that you follow-up with your pharmacy.

## 2019-09-21 NOTE — Telephone Encounter (Signed)
30 day supply dispense 2 days early on 08-25-2019

## 2019-09-22 ENCOUNTER — Telehealth: Payer: Self-pay | Admitting: Family Medicine

## 2019-09-22 MED ORDER — OXYCODONE HCL 15 MG PO TABS: 15.0000 mg | ORAL_TABLET | Freq: Four times a day (QID) | ORAL | 0 refills | Status: DC | PRN

## 2019-09-22 NOTE — Telephone Encounter (Signed)
Pt is calling and his drug store is not open on Sunday and pt would like to get refill today

## 2019-09-22 NOTE — Telephone Encounter (Signed)
Patient came by front office to let office know he was needing his oxycodone called into his pharmacy today because the pharmacy doesn't like to fill this type of Rx on Saturdays.  He wanted a hand written Rx that he could take to the pharmacy.  Patient was instructed by Lexine Baton, CMA,  Dr. Caryn Section may be able to call in the Rx later today. She also expressed he was with other patients currently and not able to write a hand written Rx.    Thanks,  [employer]

## 2019-10-16 ENCOUNTER — Other Ambulatory Visit: Payer: Self-pay | Admitting: Family Medicine

## 2019-10-16 DIAGNOSIS — K648 Other hemorrhoids: Secondary | ICD-10-CM

## 2019-10-17 ENCOUNTER — Other Ambulatory Visit: Payer: Self-pay | Admitting: Family Medicine

## 2019-10-17 DIAGNOSIS — M5136 Other intervertebral disc degeneration, lumbar region: Secondary | ICD-10-CM

## 2019-10-17 NOTE — Telephone Encounter (Signed)
Please advise patient that this is not due for refill until 10-24-2019. Will send in prescription then.

## 2019-10-17 NOTE — Telephone Encounter (Signed)
Copied from [place] [phone]. Topic: Quick Communication - Rx Refill/Question >> Oct 17, 2019  9:09 AM [name] A wrote: Medication: oxyCODONE (ROXICODONE) 15 MG immediate release tablet  Has the patient contacted their pharmacy? Yes.   (Agent: If no, request that the patient contact the pharmacy for the refill.) (Agent: If yes, when and what did the pharmacy advise?)  Preferred Pharmacy (with phone number or street name): [place], [place] - [place]  Phone:  [phone] Fax:  [phone]     Agent: Please be advised that RX refills may take up to 3 business days. We ask that you follow-up with your pharmacy.

## 2019-10-17 NOTE — Telephone Encounter (Signed)
Patient advised.

## 2019-10-17 NOTE — Telephone Encounter (Signed)
Requested medication (s) are due for refill today: yes  Requested medication (s) are on the active medication list: yes  Last refill:  09/22/2019  Future visit scheduled: Yes  Notes to clinic:  this refill cannot be delegated    Requested Prescriptions  Pending Prescriptions Disp Refills   oxyCODONE (ROXICODONE) 15 MG immediate release tablet 120 tablet 0    Sig: Take 1 tablet (15 mg total) by mouth every 6 (six) hours as needed. for pain      Not Delegated - Analgesics:  Opioid Agonists Failed - 10/17/2019  9:30 AM      Failed - This refill cannot be delegated      Failed - Urine Drug Screen completed in last 360 days.      Passed - Valid encounter within last 6 months    Recent Outpatient Visits           3 months ago Uncomplicated opioid dependence [facility])   [facility] Birdie Sons, MD   5 months ago Essential hypertension   [facility] Birdie Sons, MD   7 months ago Internal hemorrhoid   [place], Vickki Muff, [place]   8 months ago Annual physical exam   [facility] Birdie Sons, MD   1 year ago Chronic kidney disease (CKD), stage III (moderate) [facility])   [place], MD       Future Appointments             In 1 week Fisher, Kirstie Peri, MD [facility], [place]

## 2019-10-24 ENCOUNTER — Other Ambulatory Visit: Payer: Self-pay | Admitting: Family Medicine

## 2019-10-24 DIAGNOSIS — M5136 Other intervertebral disc degeneration, lumbar region: Secondary | ICD-10-CM

## 2019-10-24 MED ORDER — OXYCODONE HCL 15 MG PO TABS: 15.0000 mg | ORAL_TABLET | Freq: Four times a day (QID) | ORAL | 0 refills | Status: DC | PRN

## 2019-10-24 NOTE — Telephone Encounter (Signed)
Requested medication (s) are due for refill today: yes  Requested medication (s) are on the active medication list: yes  Last refill:  09/22/2019  Future visit scheduled: yes  Notes to clinic:  this refill cannot be delegated    Requested Prescriptions  Pending Prescriptions Disp Refills   oxyCODONE (ROXICODONE) 15 MG immediate release tablet [Pharmacy Med Name: OXYCODONE HCL 15 MG TAB] 120 tablet     Sig: TAKE 1 TABLET BY MOUTH EVERY 6 HOURS AS NEEDED FOR PAIN      Not Delegated - Analgesics:  Opioid Agonists Failed - 10/24/2019  7:47 AM      Failed - This refill cannot be delegated      Failed - Urine Drug Screen completed in last 360 days.      Passed - Valid encounter within last 6 months    Recent Outpatient Visits           3 months ago Uncomplicated opioid dependence [facility])   [facility] Birdie Sons, MD   5 months ago Essential hypertension   [facility] Birdie Sons, MD   8 months ago Internal hemorrhoid   [place], Vickki Muff, [place]   8 months ago Annual physical exam   [facility] Birdie Sons, MD   1 year ago Chronic kidney disease (CKD), stage III (moderate) [facility])   [place], Kirstie Peri, MD       Future Appointments             Tomorrow Birdie Sons, MD [facility] [place]

## 2019-10-24 NOTE — Telephone Encounter (Signed)
Pt called to check if refill has been sent to pharmacy/ please advise

## 2019-10-25 ENCOUNTER — Ambulatory Visit (INDEPENDENT_AMBULATORY_CARE_PROVIDER_SITE_OTHER): Payer: Medicare Other | Admitting: Family Medicine

## 2019-10-25 ENCOUNTER — Other Ambulatory Visit: Payer: Self-pay

## 2019-10-25 VITALS — BP 120/78 | HR 63 | Temp 97.1°F | Wt 175.0 lb

## 2019-10-25 DIAGNOSIS — I10 Essential (primary) hypertension: Secondary | ICD-10-CM

## 2019-10-25 DIAGNOSIS — I272 Pulmonary hypertension, unspecified: Secondary | ICD-10-CM

## 2019-10-25 DIAGNOSIS — E785 Hyperlipidemia, unspecified: Secondary | ICD-10-CM | POA: Diagnosis not present

## 2019-10-25 DIAGNOSIS — J438 Other emphysema: Secondary | ICD-10-CM

## 2019-10-25 DIAGNOSIS — M5020 Other cervical disc displacement, unspecified cervical region: Secondary | ICD-10-CM

## 2019-10-25 DIAGNOSIS — R351 Nocturia: Secondary | ICD-10-CM | POA: Diagnosis not present

## 2019-10-25 DIAGNOSIS — G8929 Other chronic pain: Secondary | ICD-10-CM

## 2019-10-25 DIAGNOSIS — E790 Hyperuricemia without signs of inflammatory arthritis and tophaceous disease: Secondary | ICD-10-CM

## 2019-10-25 DIAGNOSIS — I482 Chronic atrial fibrillation, unspecified: Secondary | ICD-10-CM

## 2019-10-25 DIAGNOSIS — M549 Dorsalgia, unspecified: Secondary | ICD-10-CM

## 2019-10-25 NOTE — Progress Notes (Signed)
Established patient visit   Patient: Vincent Mason   DOB: 07/29/1935   84 y.o. Male  MRN: [id number] Visit Date: 10/25/2019  Today's healthcare provider: Lelon Huh, MD   No chief complaint on file.  Subjective    HPI  The patient is an 84 year old male who presents for his 4 month follow up.  He was last seen on 05/09/19.  Hypertension, follow-up  BP Readings from Last 3 Encounters:  06/27/19 130/64  05/12/19 (!) 95/58  02/21/19 (!) 118/52   Wt Readings from Last 3 Encounters:  06/27/19 179 lb (81.2 kg)  05/12/19 184 lb 3.2 oz (83.6 kg)  02/21/19 181 lb (82.1 kg)        Pertinent labs: Lab Results  Component Value Date   CHOL 110 05/12/2019   HDL 36 (L) 05/12/2019   LDLCALC 52 05/12/2019   TRIG 123 05/12/2019   CHOLHDL 3.1 05/12/2019   Lab Results  Component Value Date   NA 145 (H) 05/12/2019   K 4.5 05/12/2019   CREATININE 2.38 (H) 05/12/2019   GFRNONAA 25 (L) 05/12/2019   GFRAA 28 (L) 05/12/2019   GLUCOSE 96 05/12/2019     The ASCVD Risk score (Goff DC Jr., et al., 2013) failed to calculate for the following reasons:   The 2013 ASCVD risk score is only valid for ages 69 to 18   --------------------------------------------------------------------------------------------------- Lipid/Cholesterol, Follow-up  Last lipid panel Other pertinent labs  Lab Results  Component Value Date   CHOL 110 05/12/2019   HDL 36 (L) 05/12/2019   LDLCALC 52 05/12/2019   TRIG 123 05/12/2019   CHOLHDL 3.1 05/12/2019   Lab Results  Component Value Date   ALT 12 05/12/2019   AST 20 05/12/2019   PLT 225 02/07/2019   TSH 4.085 08/07/2017     He was last seen for this on 05/12/19.   Management since that visit includes none.  However this lab check was 3 months after starting the Rosuvastatin.  He reports good compliance with treatment. He is not having side effects.  Symptoms: No chest pain No chest pressure/discomfort  No dyspnea No lower extremity edema   No numbness or tingling of extremity No orthopnea  No palpitations No paroxysmal nocturnal dyspnea  No speech difficulty No syncope   Current diet: not asked Current exercise: none  The ASCVD Risk score (Maverick., et al., 2013) failed to calculate for the following reasons:   The 2013 ASCVD risk score is only valid for ages 61 to 52  --------------------------------------------------------------------------------------------------- Hyperuricemia-This was improved on last lab check after he was re-started on the Allopurinol.  Lab Results  Component Value Date   LABURIC 7.2 05/12/2019        Medications: Outpatient Medications Prior to Visit  Medication Sig  . allopurinol (ZYLOPRIM) 100 MG tablet Take 1 tablet (100 mg total) by mouth 2 (two) times daily. TAKE IN PLACE OF FEBUXOSTAT  . apixaban (ELIQUIS) 5 MG TABS tablet Take 1/2 tablet twice a day (Patient taking differently: 5 mg 2 (two) times daily. )  . aspirin EC 81 MG tablet Take 81 mg by mouth daily.  . Cholecalciferol (VITAMIN D3) 1000 units CAPS Take by mouth daily.  . digoxin (LANOXIN) 0.125 MG tablet Take 0.125 mg by mouth daily.  . divalproex (DEPAKOTE) 250 MG DR tablet Take 1 tablet by mouth 2 (two) times daily.  . ferrous sulfate 325 (65 FE) MG tablet Take 1 tablet (325 mg total) by  mouth daily.  Marland Kitchen gabapentin (NEURONTIN) 300 MG capsule Take 1 capsule (300 mg total) by mouth 2 (two) times daily. (Patient not taking: Reported on 12/22/2018)  . levETIRAcetam (KEPPRA) 500 MG tablet Take 1 tablet by mouth 2 (two) times daily.  . metoprolol succinate (TOPROL-XL) 25 MG 24 hr tablet TAKE HALF A TABLET BY MOUTH DAILY  . Multiple Vitamins-Minerals (ALIVE MENS ENERGY) TABS Take 1 tablet by mouth daily.  . NONFORMULARY OR COMPOUNDED ITEM Trimix (30/1/10)-(Pap/Phent/PGE) Dosage: 24ml test dose vial 3cc size vial Qty1 [place] [phone] Fax [phone]  . oxyCODONE (ROXICODONE) 15 MG immediate release  tablet Take 1 tablet (15 mg total) by mouth every 6 (six) hours as needed. for pain  . polyethylene glycol (MIRALAX / GLYCOLAX) packet Take 17 g by mouth daily.  Marland Kitchen PROCTOZONE-HC 2.5 % rectal cream PLACE 1 APPLICATION RECTALLY TWICE DAILYAS DIRECTED  . rosuvastatin (CRESTOR) 10 MG tablet Take 1 tablet (10 mg total) by mouth daily.  Marland Kitchen spironolactone (ALDACTONE) 25 MG tablet Take 0.5 tablets (12.5 mg total) by mouth daily. (Patient not taking: Reported on 06/27/2019)  . torsemide (DEMADEX) 20 MG tablet Take one daily as needed for excessive swelling   No facility-administered medications prior to visit.        Objective    BP 120/78   Pulse 63   Temp (!) 97.1 F (36.2 C)   Wt 175 lb (79.4 kg)   SpO2 99%   BMI 25.11 kg/m     Physical Exam   General: Appearance:     Overweight male in no acute distress  Eyes:    PERRL, conjunctiva/corneas clear, EOM's intact       Lungs:     Clear to auscultation bilaterally, respirations unlabored  Heart:    Normal heart rate. Irregularly irregular rhythm.  2/6 holosystolic murmur at left lower sternal border  MS:   All extremities are intact. 1+ bipedal and distal leg edema  Neurologic:   Awake, alert, oriented x 3. No apparent focal neurological           defect.        No results found for any visits on 10/25/19.  Assessment & Plan     1. Essential hypertension Well controlled.  Continue current medications.    2. Hyperlipidemia, unspecified hyperlipidemia type He is tolerating rosuvastatin well with no adverse effects.    3. Hyperuricemia Well controlled no gout symptoms.   4. Nocturia Likely BPH.  Encouraged to increase fluids early in the day, and avoid fluids in the evening. He already has occasional issues with dizziness, so will avoid alpha blockers for the time being.   5. Displacement of cervical intervertebral disc without myelopathy   6. Chronic bilateral back pain, unspecified back location He states oxycodone works,  but occasionally takes an extra tablet. He is currently prescribed 4 per day, 120 per month. Advise will change next prescription to up to 5 a day or 150 per month which will be due for refill about 11-16-2019  7. Other emphysema ([place]) Currently asymptomatic.   8. Hypertensive pulmonary vascular disease ([place])   9. Chronic atrial fibrillation (HCC) Rate well controlled. Continue Eliquits   No follow-ups on file.      The entirety of the information documented in the History of Present Illness, Review of Systems and Physical Exam were personally obtained by me. Portions of this information were initially documented by the CMA and reviewed by me for thoroughness and accuracy.  Lelon Huh, MD  [facility] [phone] (phone) [phone] (fax)  [place]

## 2019-11-20 ENCOUNTER — Other Ambulatory Visit: Payer: Self-pay | Admitting: Family Medicine

## 2019-11-20 DIAGNOSIS — M5136 Other intervertebral disc degeneration, lumbar region: Secondary | ICD-10-CM

## 2019-11-20 NOTE — Telephone Encounter (Signed)
Requested medication (s) are due for refill today: no  Requested medication (s) are on the active medication list: yes  Last refill:  10/24/19  Future visit scheduled: no  Notes to clinic:  med not delegated to refuse or refill   Requested Prescriptions  Pending Prescriptions Disp Refills   oxyCODONE (ROXICODONE) 15 MG immediate release tablet 120 tablet 0    Sig: Take 1 tablet (15 mg total) by mouth every 6 (six) hours as needed. for pain      Not Delegated - Analgesics:  Opioid Agonists Failed - 11/20/2019 10:28 AM      Failed - This refill cannot be delegated      Failed - Urine Drug Screen completed in last 360 days.      Passed - Valid encounter within last 6 months    Recent Outpatient Visits           3 weeks ago Essential hypertension   [facility] Birdie Sons, MD   4 months ago Uncomplicated opioid dependence [facility])   [facility] Birdie Sons, MD   6 months ago Essential hypertension   [facility] Birdie Sons, MD   9 months ago Internal hemorrhoid   [place], Vickki Muff, [place]   9 months ago Annual physical exam   [facility] Birdie Sons, MD

## 2019-11-20 NOTE — Telephone Encounter (Signed)
Medication Refill - Medication: Oxycodone 150mg   Has the patient contacted their pharmacy? No. Refill isn't due for a couple more days, patient wanted to call ahead (Agent: If no, request that the patient contact the pharmacy for the refill.) (Agent: If yes, when and what did the pharmacy advise?)  Preferred Pharmacy (with phone number or street name): [facility]  Agent: Please be advised that RX refills may take up to 3 business days. We ask that you follow-up with your pharmacy.

## 2019-11-20 NOTE — Telephone Encounter (Signed)
30 day supply dispensed 10-24-2019

## 2019-11-22 ENCOUNTER — Other Ambulatory Visit: Payer: Self-pay | Admitting: Family Medicine

## 2019-11-22 DIAGNOSIS — M5136 Other intervertebral disc degeneration, lumbar region: Secondary | ICD-10-CM

## 2019-11-22 MED ORDER — OXYCODONE HCL 15 MG PO TABS: 15.0000 mg | ORAL_TABLET | Freq: Four times a day (QID) | ORAL | 0 refills | Status: DC | PRN

## 2019-11-22 NOTE — Telephone Encounter (Signed)
Patient states Dr. Caryn Section told him he would increase prescription pill count for Oxycodone from 120 to 150. When patient went to get medication the pill count was 120. Patient needs another 30 pills

## 2019-11-22 NOTE — Telephone Encounter (Signed)
I tried that, but the new STOP Act legally limits me to prescribe no more than 133 tablets a month. So, I can send in a prescription for 133 tablets around July 23rd and every 30 days thereafter.

## 2019-11-22 NOTE — Telephone Encounter (Signed)
Patient advised and verbalized understanding 

## 2019-11-27 ENCOUNTER — Encounter: Payer: Self-pay | Admitting: Emergency Medicine

## 2019-11-27 ENCOUNTER — Other Ambulatory Visit: Payer: Self-pay

## 2019-11-27 ENCOUNTER — Emergency Department
Admission: EM | Admit: 2019-11-27 | Discharge: 2019-11-27 | Disposition: A | Discharge status: Home / Self Care | Payer: Medicare Other | Attending: Emergency Medicine | Admitting: Emergency Medicine

## 2019-11-27 ENCOUNTER — Emergency Department: Payer: Medicare Other

## 2019-11-27 DIAGNOSIS — N183 Chronic kidney disease, stage 3 unspecified: Secondary | ICD-10-CM | POA: Insufficient documentation

## 2019-11-27 DIAGNOSIS — S22000A Wedge compression fracture of unspecified thoracic vertebra, initial encounter for closed fracture: Secondary | ICD-10-CM | POA: Diagnosis not present

## 2019-11-27 DIAGNOSIS — I2510 Atherosclerotic heart disease of native coronary artery without angina pectoris: Secondary | ICD-10-CM | POA: Diagnosis not present

## 2019-11-27 DIAGNOSIS — Y999 Unspecified external cause status: Secondary | ICD-10-CM | POA: Diagnosis not present

## 2019-11-27 DIAGNOSIS — M5135 Other intervertebral disc degeneration, thoracolumbar region: Secondary | ICD-10-CM

## 2019-11-27 DIAGNOSIS — S32030A Wedge compression fracture of third lumbar vertebra, initial encounter for closed fracture: Secondary | ICD-10-CM | POA: Insufficient documentation

## 2019-11-27 DIAGNOSIS — Y929 Unspecified place or not applicable: Secondary | ICD-10-CM | POA: Diagnosis not present

## 2019-11-27 DIAGNOSIS — Y939 Activity, unspecified: Secondary | ICD-10-CM | POA: Insufficient documentation

## 2019-11-27 DIAGNOSIS — S22080A Wedge compression fracture of T11-T12 vertebra, initial encounter for closed fracture: Secondary | ICD-10-CM | POA: Insufficient documentation

## 2019-11-27 DIAGNOSIS — I129 Hypertensive chronic kidney disease with stage 1 through stage 4 chronic kidney disease, or unspecified chronic kidney disease: Secondary | ICD-10-CM | POA: Insufficient documentation

## 2019-11-27 DIAGNOSIS — J449 Chronic obstructive pulmonary disease, unspecified: Secondary | ICD-10-CM | POA: Insufficient documentation

## 2019-11-27 DIAGNOSIS — S3992XA Unspecified injury of lower back, initial encounter: Secondary | ICD-10-CM | POA: Diagnosis present

## 2019-11-27 DIAGNOSIS — M4856XA Collapsed vertebra, not elsewhere classified, lumbar region, initial encounter for fracture: Secondary | ICD-10-CM

## 2019-11-27 DIAGNOSIS — Z7901 Long term (current) use of anticoagulants: Secondary | ICD-10-CM | POA: Insufficient documentation

## 2019-11-27 DIAGNOSIS — M545 Low back pain: Secondary | ICD-10-CM | POA: Diagnosis not present

## 2019-11-27 DIAGNOSIS — X58XXXA Exposure to other specified factors, initial encounter: Secondary | ICD-10-CM | POA: Insufficient documentation

## 2019-11-27 DIAGNOSIS — Z951 Presence of aortocoronary bypass graft: Secondary | ICD-10-CM | POA: Insufficient documentation

## 2019-11-27 DIAGNOSIS — S32010A Wedge compression fracture of first lumbar vertebra, initial encounter for closed fracture: Secondary | ICD-10-CM | POA: Diagnosis not present

## 2019-11-27 LAB — BASIC METABOLIC PANEL
Anion gap: 11 (ref 5–15)
BUN: 40 mg/dL — ABNORMAL HIGH (ref 8–23)
CO2: 29 mmol/L (ref 22–32)
Calcium: 9.3 mg/dL (ref 8.9–10.3)
Chloride: 100 mmol/L (ref 98–111)
Creatinine, Ser: 2.03 mg/dL — ABNORMAL HIGH (ref 0.61–1.24)
GFR calc Af Amer: 34 mL/min — ABNORMAL LOW (ref >60)
GFR calc non Af Amer: 30 mL/min — ABNORMAL LOW (ref >60)
Glucose, Bld: 113 mg/dL — ABNORMAL HIGH (ref 70–99)
Potassium: 4.3 mmol/L (ref 3.5–5.1)
Sodium: 140 mmol/L (ref 135–145)

## 2019-11-27 LAB — URINALYSIS, COMPLETE (UACMP) WITH MICROSCOPIC
Bacteria, UA: NONE SEEN
Bilirubin Urine: NEGATIVE
Glucose, UA: NEGATIVE mg/dL
Hgb urine dipstick: NEGATIVE
Ketones, ur: NEGATIVE mg/dL
Leukocytes,Ua: NEGATIVE
Nitrite: NEGATIVE
Protein, ur: NEGATIVE mg/dL
Specific Gravity, Urine: 1.018 (ref 1.005–1.030)
Squamous Epithelial / HPF: NONE SEEN (ref 0–5)
pH: 7 (ref 5.0–8.0)

## 2019-11-27 LAB — CBC
HCT: 31.6 % — ABNORMAL LOW (ref 39.0–52.0)
Hemoglobin: 10.5 g/dL — ABNORMAL LOW (ref 13.0–17.0)
MCH: 34.3 pg — ABNORMAL HIGH (ref 26.0–34.0)
MCHC: 33.2 g/dL (ref 30.0–36.0)
MCV: 103.3 fL — ABNORMAL HIGH (ref 80.0–100.0)
Platelets: 266 10*3/uL (ref 150–400)
RBC: 3.06 MIL/uL — ABNORMAL LOW (ref 4.22–5.81)
RDW: 15.2 % (ref 11.5–15.5)
WBC: 6.9 10*3/uL (ref 4.0–10.5)
nRBC: 0 % (ref 0.0–0.2)

## 2019-11-27 MED ORDER — OXYCODONE-ACETAMINOPHEN 5-325 MG PO TABS
2.0000 | ORAL_TABLET | Freq: Once | ORAL | Status: AC
  Administered 2019-11-27: 2 via ORAL
  Filled 2019-11-27: qty 2

## 2019-11-27 MED ORDER — OXYCODONE-ACETAMINOPHEN 5-325 MG PO TABS: 1.0000 | ORAL_TABLET | Freq: Three times a day (TID) | ORAL | 0 refills | Status: DC | PRN

## 2019-11-27 MED ORDER — HYDROMORPHONE HCL 1 MG/ML IJ SOLN
1.0000 mg | Freq: Once | INTRAMUSCULAR | Status: AC
  Administered 2019-11-27: 1 mg via INTRAMUSCULAR
  Filled 2019-11-27: qty 1

## 2019-11-27 NOTE — ED Triage Notes (Signed)
First nurse note- here for back pain after cleaning gutters yesterday.

## 2019-11-27 NOTE — ED Notes (Signed)
This RN called [facility] for consult on TLSO brace order.

## 2019-11-27 NOTE — ED Triage Notes (Signed)
Patient to ER for c/o lower back pain and bilateral leg pain with generalized weakness. Patient reports h/o herniated disc in the past. Denies any falls/trauma.

## 2019-11-27 NOTE — ED Provider Notes (Signed)
ER Provider Note       Time seen: 4:52 PM    I have reviewed the vital signs and the nursing notes.  HISTORY   Chief Complaint Back Pain    HPI Vincent Mason is a 84 y.o. male with a history of encephalopathy, atrial fibrillation, C. difficile colitis, COPD, hyperlipidemia, hypertension, stage III kidney disease who presents today for lower back pain and bilateral leg pain with generalized weakness.  He denies any falls or trauma, was here for back pain after cleaning his gutters yesterday.  Back pain is 5 out of 10 in the lower back.  Past Medical History:  Diagnosis Date  . Acute encephalopathy 09/03/2015  . Atrial fibrillation and flutter [facility]) 2013   Dr Nehemiah Massed at [facility]  on [place].   . C. difficile colitis 09/12/2015  . COPD (chronic obstructive pulmonary disease) ([place])   . HCAP (healthcare-associated pneumonia) 09/12/2015  . Hyperlipidemia   . Hypertension   . Kidney disease, chronic, stage III (moderate, EGFR 30-59 ml/min)    ARF 2013  . OSA (obstructive sleep apnea)    non-compliant with CPAP.   Marland Kitchen Severe sepsis with septic shock ([place]) 09/12/2015    Past Surgical History:  Procedure Laterality Date  . CATARACT EXTRACTION    . CORONARY ARTERY BYPASS GRAFT  10/1997  . history of cervical discectomy  2009   C5-C6  . Hyperplastic colon polyp  02/2002   Sigmoid polyps    Allergies Patient has no known allergies.  Review of Systems Constitutional: Negative for fever. Cardiovascular: Negative for chest pain. Respiratory: Negative for shortness of breath. Gastrointestinal: Negative for abdominal pain, vomiting and diarrhea. Musculoskeletal: Positive for back pain Skin: Negative for rash. Neurological: Negative for headaches, focal weakness or numbness.  All systems negative/normal/unremarkable except as stated in the HPI  ____________________________________________   PHYSICAL EXAM:  VITAL SIGNS: Vitals:   11/27/19 1325  BP: 128/72  Pulse: 84  Resp:  16  Temp: 98.3 F (36.8 C)  SpO2: 99%    Constitutional: Alert and oriented. Well appearing and in no distress. Cardiovascular: Normal rate, regular rhythm. No murmurs, rubs, or gallops. Respiratory: Normal respiratory effort without tachypnea nor retractions. Breath sounds are clear and equal bilaterally. No wheezes/rales/rhonchi. Gastrointestinal: Soft and nontender. Normal bowel sounds Musculoskeletal: Negative cross and straight leg raise examination Neurologic:  Normal speech and language. No gross focal neurologic deficits are appreciated.  Skin:  Skin is warm, dry and intact. No rash noted. Psychiatric: Speech and behavior are normal.   ____________________________________________   LABS (pertinent positives/negatives)  Labs Reviewed  BASIC METABOLIC PANEL - Abnormal; Notable for the following components:      Result Value   Glucose, Bld 113 (*)    BUN 40 (*)    Creatinine, Ser 2.03 (*)    GFR calc non Af Amer 30 (*)    GFR calc Af Amer 34 (*)    All other components within normal limits  CBC - Abnormal; Notable for the following components:   RBC 3.06 (*)    Hemoglobin 10.5 (*)    HCT 31.6 (*)    MCV 103.3 (*)    MCH 34.3 (*)    All other components within normal limits  URINALYSIS, COMPLETE (UACMP) WITH MICROSCOPIC - Abnormal; Notable for the following components:   Color, Urine YELLOW (*)    APPearance CLEAR (*)    All other components within normal limits    RADIOLOGY  Images were viewed by me LS spine  IMPRESSION: 1. Moderate L3 compression fracture age indeterminate, but new since 2017. Mild compression deformity of T12 age indeterminate, but new since 2019.  2. Moderate spondylosis of the lumbar spine with disc disease as described. Mild grade 1 anterolisthesis of L4 on L5 due to the moderate facet arthropathy. Grade 1 retrolisthesis of L2 on L3 likely due to the moderate facet arthropathy.  DIFFERENTIAL DIAGNOSIS  Strain, spasm, fracture,  sciatica, degenerative disc disease  ASSESSMENT AND PLAN  Degenerative disc disease, compression fractures   Plan: The patient had presented for low back pain. Patient's labs were overall reassuring.  His imaging did reveal an L3 compression fracture and a T12 compression fracture which were age-indeterminate.  He does have significant degenerative disc disease in his back.  We will attempt to fit him with a TLSO brace.  He will be referred to a back surgeon for outpatient follow-up.  Lenise Arena MD    Note: This note was generated in part or whole with voice recognition software. Voice recognition is usually quite accurate but there are transcription errors that can and very often do occur. I apologize for any typographical errors that were not detected and corrected.     Earleen Newport, MD 11/27/19 [phone]

## 2019-11-29 ENCOUNTER — Telehealth: Payer: Self-pay

## 2019-11-29 DIAGNOSIS — G8929 Other chronic pain: Secondary | ICD-10-CM

## 2019-11-29 DIAGNOSIS — M5136 Other intervertebral disc degeneration, lumbar region: Secondary | ICD-10-CM

## 2019-11-29 DIAGNOSIS — M5020 Other cervical disc displacement, unspecified cervical region: Secondary | ICD-10-CM

## 2019-11-29 DIAGNOSIS — M549 Dorsalgia, unspecified: Secondary | ICD-10-CM

## 2019-11-29 NOTE — Telephone Encounter (Signed)
Pt called in again wanting to hear back from [place] regarding a shot for his back pain. Please advise

## 2019-11-29 NOTE — Telephone Encounter (Signed)
Copied from [place] [phone]. Topic: General - Other >> Nov 29, 2019  3:23 PM Celene Kras wrote: Reason for CRM: Pt called stating that he is having back pain. Pt is requesting to have advice on where he should go to have surgery on his back. Pt states that he has almost used all of his oxycodone due to his pain. Please advise.

## 2019-11-30 NOTE — Telephone Encounter (Signed)
Patient is requesting a call back due to back pain.  Patient states he has been waiting since yesterday for call.  Patient would like the injection asap.

## 2019-11-30 NOTE — Telephone Encounter (Signed)
Referral placed. Patient advised. 

## 2019-11-30 NOTE — Telephone Encounter (Signed)
Pt called and stated he is still in severe pain/ Pt stated no one has called him back concerning other options to help with his pain/ please advise

## 2019-11-30 NOTE — Telephone Encounter (Addendum)
He needs referral to Dr. Holley Raring at the pain clinic  Dr. Holley Raring does injections, but he also works with the back surgeons to divide if surgery would be helpful or not.

## 2019-12-04 ENCOUNTER — Other Ambulatory Visit: Payer: Self-pay | Admitting: Family Medicine

## 2019-12-04 DIAGNOSIS — M5136 Other intervertebral disc degeneration, lumbar region: Secondary | ICD-10-CM

## 2019-12-04 DIAGNOSIS — M51369 Other intervertebral disc degeneration, lumbar region without mention of lumbar back pain or lower extremity pain: Secondary | ICD-10-CM

## 2019-12-04 MED ORDER — OXYCODONE HCL 15 MG PO TABS: 15.0000 mg | ORAL_TABLET | Freq: Four times a day (QID) | ORAL | 0 refills | Status: DC | PRN

## 2019-12-04 MED ORDER — OXYCODONE-ACETAMINOPHEN 5-325 MG PO TABS: 1.0000 | ORAL_TABLET | Freq: Four times a day (QID) | ORAL | 0 refills | Status: DC | PRN

## 2019-12-04 NOTE — Telephone Encounter (Signed)
Patient calling back to check status of request. Patient states that he is in pain and requesting call back as soon as possible.

## 2019-12-04 NOTE — Telephone Encounter (Signed)
Requested medication (s) are due for refill today: no  Requested medication (s) are on the active medication list: yes  Last refill:  11/27/2019  Future visit scheduled: no  Notes to clinic:  Patient requesting an  increase or refill of oxyCODONE-acetaminophen (PERCOCET) 5-325 MG tablet  Patient states the pain medication specialist cant see him until 2 weeks. Patient states he is aware he is not due until  12/15/2019 but had to double up due to the back pain.  Patient would like a follow up call   Requested Prescriptions  Pending Prescriptions Disp Refills   oxyCODONE-acetaminophen (PERCOCET) 5-325 MG tablet 20 tablet 0    Sig: Take 1 tablet by mouth every 8 (eight) hours as needed.      Not Delegated - Analgesics:  Opioid Agonist Combinations Failed - 12/04/2019  8:54 AM      Failed - This refill cannot be delegated      Failed - Urine Drug Screen completed in last 360 days.      Passed - Valid encounter within last 6 months    Recent Outpatient Visits           1 month ago Essential hypertension   [facility] Birdie Sons, MD   5 months ago Uncomplicated opioid dependence [facility])   [facility] Birdie Sons, MD   6 months ago Essential hypertension   [facility] Birdie Sons, MD   9 months ago Internal hemorrhoid   [place], Vickki Muff, [place]   10 months ago Annual physical exam   [facility] Birdie Sons, MD

## 2019-12-04 NOTE — Telephone Encounter (Signed)
Patient requesting an  increase or refill of oxyCODONE-acetaminophen (PERCOCET) 5-325 MG tablet  Patient states the pain medication specialist cant see him until 2 weeks. Patient states he is aware he is not due until  12/15/2019 but had to double up due to the back pain.  Patient would like a follow up call  [place], [place] Phone:  [phone]  Fax:  [phone]

## 2019-12-05 ENCOUNTER — Telehealth: Payer: Self-pay

## 2019-12-05 NOTE — Telephone Encounter (Signed)
Copied from [place] [phone] [phone]. Topic: General - Other >> Dec 05, 2019 10:00 AM [name] A wrote: Reason for CRM: Patient called to express his thanks and gratitude to Dr Caryn Section saying that last night was his first good night sleep in two days because of the new Rx that was prescribed. He just want to say thank you very much.Vincent Mason

## 2019-12-07 ENCOUNTER — Telehealth: Payer: Self-pay | Admitting: Family Medicine

## 2019-12-07 NOTE — Chronic Care Management (AMB) (Signed)
  Chronic Care Management   Note  12/07/2019 Name: Vincent Mason MRN: [id number] DOB: [birth year]  Vincent Mason is a 84 y.o. year old male who is a primary care patient of Caryn Section, Kirstie Peri, MD. I reached out to Marigene Ehlers by phone today in response to a referral sent by Vincent Mason health plan.     Mr. Kleinert was given information about Chronic Care Management services today including:  1. CCM service includes personalized support from designated clinical staff supervised by his physician, including individualized plan of care and coordination with other care providers 2. 24/7 contact phone numbers for assistance for urgent and routine care needs. 3. Service will only be billed when office clinical staff spend 20 minutes or more in a month to coordinate care. 4. Only one practitioner may furnish and bill the service in a calendar month. 5. The patient may stop CCM services at any time (effective at the end of the month) by phone call to the office staff. 6. The patient will be responsible for cost sharing (co-pay) of up to 20% of the service fee (after annual deductible is met).  Patient did not agree to enrollment in care management services and does not wish to consider at this time.  Follow up plan: The patient has been provided with contact information for the care management team and has been advised to call with any health related questions or concerns.   Noreene Larsson, [place], [place], [place] [address] Direct Dial: [phone] [email or web address] Website: [email or web address]

## 2019-12-11 ENCOUNTER — Telehealth: Payer: Self-pay | Admitting: Family Medicine

## 2019-12-11 DIAGNOSIS — M5136 Other intervertebral disc degeneration, lumbar region: Secondary | ICD-10-CM

## 2019-12-11 NOTE — Telephone Encounter (Signed)
Patient states Dr. Caryn Section had instructed to go to the pain center. The earliest available appointment is a month out and patient is still having pain. Dr. Caryn Section wrote a prescription to "hold him off" the pain until he can see the pain center. 2 medications given for pain. Patient states oxyCODONE-acetaminophen (PERCOCET) 5-325 MG tablet  Made him sick on the stomach. Vincent Mason has requested a refill on oxyCODONE (ROXICODONE) 15 MG immediate release tablet

## 2019-12-11 NOTE — Telephone Encounter (Signed)
Pt has called back after receiving no answers today 7/19. Marland Kitchen Pt states in almost unbearable pain and states can not bear the thought of another nite like last nite in pain with these 4 disc out. Pt wanting Dr Caryn Section home #. Pt restating has appt with pain mang but can not get in till Aug 10th. Pt begging for some kind of relief. Assured pt that note would be put in most urgent status.  PT FU is  [phone]

## 2019-12-11 NOTE — Telephone Encounter (Signed)
Requested medication (s) are due for refill today: Yes  Requested medication (s) are on the active medication list: Yes  Last refill:  12/04/19  Future visit scheduled: Yes   Notes to clinic:  See notes below from TE, appointment with pain clinic in August, patient requesting refill until then, unable to refill per protocol cannot delegate.     Requested Prescriptions  Pending Prescriptions Disp Refills   oxyCODONE (ROXICODONE) 15 MG immediate release tablet 56 tablet 0    Sig: Take 1 tablet (15 mg total) by mouth every 6 (six) hours as needed. for pain      Not Delegated - Analgesics:  Opioid Agonists Failed - 12/11/2019  9:41 AM      Failed - This refill cannot be delegated      Failed - Urine Drug Screen completed in last 360 days.      Passed - Valid encounter within last 6 months    Recent Outpatient Visits           1 month ago Essential hypertension   [facility] Birdie Sons, MD   5 months ago Uncomplicated opioid dependence [facility])   [facility] Birdie Sons, MD   7 months ago Essential hypertension   [place] Birdie Sons, MD   9 months ago Internal hemorrhoid   [place], Vickki Muff, [place]   10 months ago Annual physical exam   [place], [place]                oxyCODONE-acetaminophen (PERCOCET) 5-325 MG tablet 40 tablet 0    Sig: Take 1 tablet by mouth every 6 (six) hours as needed. MAY DISPENSE TODAY 12/04/2019 DUE TO RECENT BACK INJURY      Not Delegated - Analgesics:  Opioid Agonist Combinations Failed - 12/11/2019  9:41 AM      Failed - This refill cannot be delegated      Failed - Urine Drug Screen completed in last 360 days.      Passed - Valid encounter within last 6 months    Recent Outpatient Visits           1 month ago Essential hypertension   [facility] Birdie Sons, MD   5 months ago Uncomplicated opioid  dependence [facility])   [facility] Birdie Sons, MD   7 months ago Essential hypertension   [facility] Birdie Sons, MD   9 months ago Internal hemorrhoid   [place], Vickki Muff, [place]   10 months ago Annual physical exam   [facility] Birdie Sons, MD

## 2019-12-11 NOTE — Addendum Note (Signed)
Addended by: Matilde Sprang on: 12/11/2019 09:41 AM   Modules accepted: Orders

## 2019-12-12 MED ORDER — OXYCODONE-ACETAMINOPHEN 5-325 MG PO TABS: 1.0000 | ORAL_TABLET | Freq: Four times a day (QID) | ORAL | 0 refills | Status: DC | PRN

## 2019-12-12 MED ORDER — OXYCODONE HCL 15 MG PO TABS: 15.0000 mg | ORAL_TABLET | Freq: Four times a day (QID) | ORAL | 0 refills | Status: DC | PRN

## 2019-12-12 NOTE — Addendum Note (Signed)
Addended by: Birdie Sons on: 12/12/2019 08:38 AM   Modules accepted: Orders

## 2019-12-12 NOTE — Telephone Encounter (Signed)
Spoke to [place] and he reports medications have been picked up.

## 2019-12-12 NOTE — Telephone Encounter (Signed)
2 week supply was dispensed on 12-04-2019. He is not take more then 4 oxycodone 15 mg per day, and no more than 4 Percocet per day. Prescription has been sent today for just a one week supply since he is not following the directions. Next refill will be next Tuesday. If he keeps running out early we will have to write prescriptions one day at a time. If the pain is that severe then he needs to go to ER.

## 2019-12-12 NOTE — Telephone Encounter (Addendum)
Please call the pharmacy and cancel the Percocet and oxycodone prescriptions sent earlier today. Then I'll send in a new oxycodone prescription.  Advise the patient I cannot legally prescribed more the 6 of the oxycodone 15mg  tablets per day so he is not to take more then 6 in a day.

## 2019-12-12 NOTE — Telephone Encounter (Signed)
Patient called and says the Percocet is making him sick when he takes it, he gets really nauseated and feel like he's going to throw up. He says what he's doing is taking Oxycodone 15 mg 2 tabs every 6 hours and that is relieving his pain. He asks if Dr. Caryn Section would not order the Percocet and order enough of the Oxycodone to last until he has a visit at the pain center on 01/02/20. He asks for a call back to discuss why he is not able to take Oxycodone 2 tabs for pain if that's what is relieving his pain. He says he will go to the drug store and pick up the oxycodone sent in today, but will not pick up the percocet. He says if there is another pill that will help besides taking Oxycodone 2 tabs, he's willing to try that. But for now, the Oxycodone 2 tabs is what is relieving his pain. I tried to advise of the note below by Dr. Caryn Section and he says the lady on the phone before me told him all of that and the ED is not going to do any more than what is already being done. I advised I will send this over to Dr. Caryn Section for his review and someone will call back with his recommendation.

## 2019-12-12 NOTE — Telephone Encounter (Signed)
This encounter was created in error - please disregard.

## 2019-12-18 ENCOUNTER — Other Ambulatory Visit: Payer: Self-pay | Admitting: Family Medicine

## 2019-12-18 DIAGNOSIS — M5136 Other intervertebral disc degeneration, lumbar region: Secondary | ICD-10-CM

## 2019-12-18 MED ORDER — OXYCODONE HCL 15 MG PO TABS: ORAL_TABLET | ORAL | 0 refills | Status: DC

## 2019-12-20 ENCOUNTER — Ambulatory Visit: Payer: Self-pay | Admitting: *Deleted

## 2019-12-20 ENCOUNTER — Other Ambulatory Visit: Payer: Self-pay | Admitting: Family Medicine

## 2019-12-20 DIAGNOSIS — M5136 Other intervertebral disc degeneration, lumbar region: Secondary | ICD-10-CM

## 2019-12-20 MED ORDER — OXYCODONE HCL 15 MG PO TABS: ORAL_TABLET | ORAL | 0 refills | Status: DC

## 2019-12-20 NOTE — Telephone Encounter (Signed)
Pt asked for Dr. Caryn Section to send another refill for a few pills of oxyCODONE (ROXICODONE) 15 MG immediate release tablet Today please/ Pt states he is in pain and when he went to take his pills in the bathroom yesterday evening he dropped them down the sink/ please advise

## 2019-12-20 NOTE — Telephone Encounter (Signed)
Patient calling back, states that he is in terrible pain and cannot stand it much longer. He states that it would only take Dr. Caryn Section about 5 minutes. Patient is very upset. Transferred patient to nurse triage due to the amount of pain patient states he has. Advised patient that [facility] cannot fill this medication.

## 2019-12-20 NOTE — Telephone Encounter (Signed)
Patient reports he has herniated disc- he had been prescribed pain medication and does have appointment with pain clinic upcoming. PCP has been prescribing pain medication until that appointment 01/07/20.  Patient was in pain last night and when he opened his medication bottle- the pills spilled in the sink- and he could not stop them. Patient has been in severe pain since last night and is requesting a replacement Rx. Patient is in pain and would like to get replacement as soon as possible. Patient would like call back from office when done. Explained the provider has been notified and will address as soon as he can. Patient is not happy- mainly because of the pain.  Reason for Disposition . [1] Prescription refill request for ESSENTIAL medicine (i.e., likelihood of harm to patient if not taken) AND [2] triager unable to refill per department policy  Answer Assessment - Initial Assessment Questions 1. DRUG NAME: "What medicine do you need to have refilled?"     Oxycodone 2. REFILLS REMAINING: "How many refills are remaining?" (Note: The label on the medicine or pill bottle will show how many refills are remaining. If there are no refills remaining, then a renewal may be needed.)     None- spilled in sink 3. EXPIRATION DATE: "What is the expiration date?" (Note: The label states when the prescription will expire, and thus can no longer be refilled.)     n/a 4. PRESCRIBING HCP: "Who prescribed it?" Reason: If prescribed by specialist, call should be referred to that group.     PCP 5. SYMPTOMS: "Do you have any symptoms?"     Pain in back 6. PREGNANCY: "Is there any chance that you are pregnant?" "When was your last menstrual period?"     n/a  Protocols used: MEDICATION REFILL AND RENEWAL CALL-A-AH

## 2019-12-20 NOTE — Telephone Encounter (Signed)
Have sent 2 day supply to his pharmacy

## 2019-12-20 NOTE — Telephone Encounter (Signed)
I called and spoke with patient. The last refill  that was sent in on 12/18/2019 for a qty of 28 tablets was picked up by patient on the same day. Patient says that last night he spilled all the remaining pills of that bottle in the sink and was not able to catch them. Patient says he is in severe pain and wants Dr. Caryn Section to send in a refill for Oxycodone ASAP. Please advise.

## 2019-12-20 NOTE — Telephone Encounter (Signed)
Tried calling patient on both numbers listed in chart. No answer. Left message to call back. I called the pharmacy and was advised that patient has already picked up this prescription.

## 2019-12-20 NOTE — Telephone Encounter (Signed)
Patient calling back about this request. Patient states he is in a considerable amount of pain. Patient requesting to speak with Dr. Caryn Section or CMA.

## 2019-12-22 ENCOUNTER — Telehealth: Payer: Self-pay

## 2019-12-22 ENCOUNTER — Other Ambulatory Visit: Payer: Self-pay | Admitting: Family Medicine

## 2019-12-22 DIAGNOSIS — M5136 Other intervertebral disc degeneration, lumbar region: Secondary | ICD-10-CM

## 2019-12-22 MED ORDER — OXYCODONE HCL 15 MG PO TABS: 15.0000 mg | ORAL_TABLET | Freq: Four times a day (QID) | ORAL | 0 refills | Status: DC | PRN

## 2019-12-22 NOTE — Telephone Encounter (Signed)
Copied from [place] [id number]. Topic: Quick Communication - Rx Refill/Question >> Dec 22, 2019  8:48 AM Hinda Lenis D wrote: PT is requesting a refill till August 15th, his schudule with the Pain Clinic / oxyCODONE (ROXICODONE) 15 MG immediate release tablet [[id number]]

## 2019-12-25 ENCOUNTER — Telehealth: Payer: Self-pay | Admitting: Family Medicine

## 2019-12-25 ENCOUNTER — Other Ambulatory Visit: Payer: Self-pay | Admitting: Family Medicine

## 2019-12-25 DIAGNOSIS — M5136 Other intervertebral disc degeneration, lumbar region: Secondary | ICD-10-CM

## 2019-12-25 MED ORDER — OXYCODONE HCL 15 MG PO TABS: 15.0000 mg | ORAL_TABLET | Freq: Four times a day (QID) | ORAL | 0 refills | Status: DC | PRN

## 2019-12-25 NOTE — Telephone Encounter (Signed)
I resent this prescription this morning. Please check with [place] to see if order when through. Thanks.

## 2019-12-25 NOTE — Telephone Encounter (Signed)
Patient is calling again in [place] to pain medication and is asking if his request can be expedited. Please advise

## 2019-12-25 NOTE — Telephone Encounter (Signed)
Medication: oxyCODONE (ROXICODONE) 15 MG immediate release tablet [[id number]]   Has the patient contacted their pharmacy? Yes  (Agent: If no, request that the patient contact the pharmacy for the refill.) (Agent: If yes, when and what did the pharmacy advise?)  Preferred Pharmacy (with phone number or street name): [place], [place] - [address]  Phone:  [phone] Fax:  [phone]     Agent: Please be advised that RX refills may take up to 3 business days. We ask that you follow-up with your pharmacy.

## 2019-12-25 NOTE — Telephone Encounter (Signed)
Please review for transmission failure of non delegated Rx

## 2019-12-25 NOTE — Telephone Encounter (Signed)
Patient is calling back to ask if the medication can be resent since the transmission failed this morning at 8:15a. Patient reports being in extreme pain. Please advise

## 2019-12-25 NOTE — Telephone Encounter (Signed)
Vincent Mason did not receive the Rx this morning.

## 2019-12-25 NOTE — Addendum Note (Signed)
Addended by: Birdie Sons on: 12/25/2019 08:14 AM   Modules accepted: Orders

## 2019-12-25 NOTE — Telephone Encounter (Signed)
Patient calling back about this request.

## 2019-12-25 NOTE — Progress Notes (Signed)
This encounter was created in error - please disregard.

## 2019-12-25 NOTE — Telephone Encounter (Signed)
Copied from [place] [id number]. Topic: Quick Communication - Rx Refill/Question >> Dec 22, 2019  8:48 AM Hinda Lenis D wrote: PT is requesting a refill till August 15th, his schudule with the Pain Clinic / oxyCODONE (ROXICODONE) 15 MG immediate release tablet [[id number]] >> Dec 25, 2019  8:37 AM Keene Breath wrote: Patient is calling again to get some pain medication until his appt. On 8/10.  He said the pain is going down his leg and he can hardly walk now.  He would like the pain medication as soon as possible.

## 2019-12-25 NOTE — Telephone Encounter (Addendum)
Pt calling again to follow up on Rx. Pt is requesting enough pills to get him to 8/9 when he goes to pain clinic.  Also this rx Transmission to pharmacy failed (12/25/2019 8:15 AM EDT) Pt requesting send ASAP.  Pt has called several times today, and I guess no one saw transmission to pharmacy failed

## 2019-12-25 NOTE — Telephone Encounter (Signed)
Ok, I resent prescription for third time. Please check with [place] to see if they received it. Thanks.

## 2019-12-26 ENCOUNTER — Other Ambulatory Visit: Payer: Self-pay

## 2019-12-26 NOTE — Telephone Encounter (Signed)
Copied from [place] [id number]. Topic: General - Other >> Dec 26, 2019  9:00 AM Alanda Slim E wrote: Reason for CRM: Pt called to speak with Dr. Caryn Section and let him know that the Pain in his back has doubled in the last week and is now radiated to his legs and legs have numbness/ Pt stated he can hardly walk and that 1 pill (oxyCODONE (ROXICODONE) 15 MG immediate release tablet) every six hrs is not working and he thinks the medication need to be increased or stronger / please advise

## 2019-12-27 ENCOUNTER — Other Ambulatory Visit: Payer: Self-pay | Admitting: Family Medicine

## 2019-12-27 DIAGNOSIS — M5136 Other intervertebral disc degeneration, lumbar region: Secondary | ICD-10-CM

## 2019-12-27 DIAGNOSIS — G2581 Restless legs syndrome: Secondary | ICD-10-CM

## 2019-12-27 NOTE — Telephone Encounter (Signed)
Patient advised. He is upset and states he does not understand why he csn't get more medication. Tried to explain that he is taking to much medication the maximum is 4 tablets per day. Patient was constitently arguing that his pain is worse so he was taking more medication. Patient states he is completely out of medication. Advised patient to go to the ER. He states he has been to the ER previously and they only give him a injection that only works for 4 hours. Patient advised to try OTC pain reliever. He again argued that would not help him, and if Dr. Caryn Section would "do his job" he would be fine. Patient advised Dr. Caryn Section did do his job and sent a RX for 20 tablets that should have lasted at least 5 days and that he over used the medication.

## 2019-12-27 NOTE — Telephone Encounter (Unsigned)
Copied from [place] [id number]. Topic: Quick Communication - Rx Refill/Question >> Dec 27, 2019 12:02 PM [name] A wrote: Medication: oxyCODONE (ROXICODONE) 15 MG immediate release tablet   Has the patient contacted their pharmacy? Yes.   (Agent: If no, request that the patient contact the pharmacy for the refill.) (Agent: If yes, when and what did the pharmacy advise?)  Preferred Pharmacy (with phone number or street name): [place], [place] - [address] [place]  Phone:  [phone] Fax:  [phone]     Agent: Please be advised that RX refills may take up to 3 business days. We ask that you follow-up with your pharmacy.

## 2019-12-27 NOTE — Telephone Encounter (Signed)
Just had 20 tablets sent 2 days ago.... if he is having that much pain he needs to go to ER, he is taking way to much pain medication.

## 2019-12-27 NOTE — Telephone Encounter (Signed)
Requested medication (s) are due for refill today: no  Requested medication (s) are on the active medication list: yes  Last refill: 12/25/2019  Future visit scheduled: no  Notes to clinic: this refill cannot be delegated Per pharmacy script from 12/25/2019 was picked up   Requested Prescriptions  Pending Prescriptions Disp Refills   oxyCODONE (ROXICODONE) 15 MG immediate release tablet 20 tablet 0    Sig: Take 1 tablet (15 mg total) by mouth every 6 (six) hours as needed for pain.      Not Delegated - Analgesics:  Opioid Agonists Failed - 12/27/2019 12:05 PM      Failed - This refill cannot be delegated      Failed - Urine Drug Screen completed in last 360 days.      Passed - Valid encounter within last 6 months    Recent Outpatient Visits           2 months ago Essential hypertension   [facility] Birdie Sons, MD   6 months ago Uncomplicated opioid dependence [facility])   [facility] Birdie Sons, MD   7 months ago Essential hypertension   [facility] Birdie Sons, MD   10 months ago Internal hemorrhoid   [place], Vickki Muff, [place]   10 months ago Annual physical exam   [facility] Birdie Sons, MD

## 2019-12-27 NOTE — Telephone Encounter (Signed)
Pt calling again for an update. He states that he only has 3 pills left and that his pain is moving down to his legs. Please advise.

## 2019-12-27 NOTE — Progress Notes (Signed)
Subjective:   Vincent Mason is a 84 y.o. male who presents for Medicare Annual/Subsequent preventive examination.  I connected with Vincent Mason today by telephone and verified that I am speaking with the correct person using two identifiers. Location patient: home Location provider: work Persons participating in the virtual visit: patient, provider.   I discussed the limitations, risks, security and privacy concerns of performing an evaluation and management service by telephone and the availability of in person appointments. I also discussed with the patient that there may be a patient responsible charge related to this service. The patient expressed understanding and verbally consented to this telephonic visit.    Interactive audio and video telecommunications were attempted between this provider and patient, however failed, due to patient having technical difficulties OR patient did not have access to video capability.  We continued and completed visit with audio only.   Review of Systems    N/A  Cardiac Risk Factors include: advanced age (>74mn, >>34women);dyslipidemia;male gender     Objective:    Today's Vitals   12/28/19 1015  PainSc: 10-Worst pain ever   There is no height or weight on file to calculate BMI.  Advanced Directives 12/28/2019 11/27/2019 12/22/2018 10/22/2017 08/06/2017 01/21/2017 12/31/2016  Does Patient Have a Medical Advance Directive? Yes No No No No No No  Type of AParamed[place]of [place] will - - - - - -  Does patient want to make changes to medical advance directive? - - No - Patient declined - - - -  Copy of [place] Chart? No - copy requested - - - - - -  Would patient like information on creating a medical advance directive? - No - Patient declined - No - Patient declined Yes (Inpatient - patient requests chaplain consult to create a medical advance directive) No - Patient declined No - Patient declined     Current Medications (verified) Outpatient Encounter Medications as of 12/28/2019  Medication Sig  . allopurinol (ZYLOPRIM) 100 MG tablet Take 1 tablet (100 mg total) by mouth 2 (two) times daily. TAKE IN PLACE OF FEBUXOSTAT  . apixaban (ELIQUIS) 5 MG TABS tablet Take 1/2 tablet twice a day (Patient taking differently: 5 mg 2 (two) times daily. )  . aspirin EC 81 MG tablet Take 81 mg by mouth daily.  . Cholecalciferol (VITAMIN D3) 1000 units CAPS Take 2,000 Units by mouth daily.   . digoxin (LANOXIN) 0.125 MG tablet Take 0.125 mg by mouth daily.  . ferrous sulfate 325 (65 FE) MG tablet Take 1 tablet (325 mg total) by mouth daily.  .Marland Kitchengabapentin (NEURONTIN) 300 MG capsule Take 1 capsule (300 mg total) by mouth 2 (two) times daily.  . hydrocortisone cream 1 % Apply 1 application topically 2 (two) times daily. As needed  . levETIRAcetam (KEPPRA) 500 MG tablet Take 1 tablet by mouth 2 (two) times daily.  . metoprolol succinate (TOPROL-XL) 25 MG 24 hr tablet TAKE HALF A TABLET BY MOUTH DAILY  . Multiple Vitamins-Minerals (ALIVE MENS ENERGY) TABS Take 1 tablet by mouth daily.  .Marland KitchenoxyCODONE (ROXICODONE) 15 MG immediate release tablet Take 1 tablet (15 mg total) by mouth every 4 (four) hours as needed for pain. Not to exceed 6 tablets in 1 day  . polyethylene glycol (MIRALAX / GLYCOLAX) packet Take 17 g by mouth daily.  . rosuvastatin (CRESTOR) 10 MG tablet Take 1 tablet (10 mg total) by mouth daily.  .Marland Kitchenspironolactone (ALDACTONE) 25  MG tablet Take 1 tablet by mouth daily.  Marland Kitchen torsemide (DEMADEX) 20 MG tablet Take one daily as needed for excessive swelling  . divalproex (DEPAKOTE) 250 MG DR tablet Take 1 tablet by mouth 2 (two) times daily.  . NONFORMULARY OR COMPOUNDED ITEM Trimix (30/1/10)-(Pap/Phent/PGE) Dosage: 33m test dose vial 3cc size vial Qty1 [phone] . PROCTOZONE-HC 2.5 % rectal cream PLACE 1 APPLICATION RECTALLY TWICE DAILYAS DIRECTED   . [DISCONTINUED] gabapentin (NEURONTIN) 300 MG capsule Take 1 capsule (300 mg total) by mouth 2 (two) times daily. (Patient not taking: Reported on 12/22/2018)  . [DISCONTINUED] oxyCODONE (ROXICODONE) 15 MG immediate release tablet Take 1 tablet (15 mg total) by mouth every 6 (six) hours as needed for pain.   No facility-administered encounter medications on file as of 12/28/2019.    Allergies (verified) Patient has no known allergies.   History: Past Medical History:  Diagnosis Date  . Acute encephalopathy 09/03/2015  . Atrial fibrillation and flutter ([facility] 2013   [facility]   . C. difficile colitis 09/12/2015  . COPD (chronic obstructive pulmonary disease) ([facility]   . HCAP (healthcare-associated pneumonia) 09/12/2015  . Hyperlipidemia   . Hypertension   . Kidney disease, chronic, stage III (moderate, EGFR 30-59 ml/min)    ARF 2013  . OSA (obstructive sleep apnea)    non-compliant with CPAP.   .Marland KitchenSevere sepsis with septic shock ([facility] 09/12/2015   Past Surgical History:  Procedure Laterality Date  . CATARACT EXTRACTION    . CORONARY ARTERY BYPASS GRAFT  10/1997  . history of cervical discectomy  2009   C5-C6  . Hyperplastic colon polyp  02/2002   Sigmoid polyps   Family History  Problem Relation Age of Onset  . Hypertension Mother   . Hypertension Father    Social History   Socioeconomic History  . Marital status: Married    Spouse name: Not on file  . Number of children: 3  . Years of education: some colle  . Highest education level: Some college, no degree  Occupational History  . Occupation: Retired  Tobacco Use  . Smoking status: Former SResearch scientist (life sciences) . Smokeless tobacco: Never Used  . Tobacco comment: Quit in 1990  Vaping Use  . Vaping Use: Never used  Substance and Sexual Activity  . Alcohol use: No  . Drug use: No  . Sexual activity: Yes  Other Topics Concern  . Not on file  Social History Narrative  . Not on file   Social Determinants of  Health   Financial Resource Strain: Low Risk   . Difficulty of Paying Living Expenses: Not hard at all  Food Insecurity: No Food Insecurity  . Worried About RCharity fundraiserin the Last Year: Never true  . Ran Out of Food in the Last Year: Never true  Transportation Needs: No Transportation Needs  . Lack of Transportation (Medical): No  . Lack of Transportation (Non-Medical): No  Physical Activity: Inactive  . Days of Exercise per Week: 0 days  . Minutes of Exercise per Session: 0 min  Stress: Stress Concern Present  . Feeling of Stress : Rather much  Social Connections: Socially Isolated  . Frequency of Communication with Friends and Family: Twice a week  . Frequency of Social Gatherings with Friends and Family: Never  . Attends Religious Services: Never  . Active Member of Clubs or Organizations: No  . Attends CArchivistMeetings: Never  .  Marital Status: Married    Tobacco Counseling Counseling given: Not Answered Comment: Quit in 1990   Clinical Intake:  Pre-visit preparation completed: Yes  Pain : 0-10 Pain Score: 10-Worst pain ever (Back pain concern that has been discussed with PCP. Sees previous telephone encounters on file.) Pain Type: Chronic pain Pain Location: Back (Due to a herniated disc.) Pain Orientation: Lower Pain Descriptors / Indicators: Aching, Radiating Pain Frequency: Constant Pain Relieving Factors: Taking Oxycodone for pain.  Pain Relieving Factors: Taking Oxycodone for pain.  Nutritional Risks: None Diabetes: No  How often do you need to have someone help you when you read instructions, pamphlets, or other written materials from your doctor or pharmacy?: 1 - Never  Diabetic? No  Interpreter Needed?: No  Information entered by :: [facility], LPN   Activities of Daily Living In your present state of health, do you have any difficulty performing the following activities: 12/28/2019  Hearing? Y  Comment Wears a hearing aid in  the right ear.  Vision? N  Difficulty concentrating or making decisions? N  Walking or climbing stairs? Y  Comment Due to back pains.  Dressing or bathing? Y  Comment Has assistance bathing and putting on socks.  Doing errands, shopping? Y  Comment Does not drive.  Preparing Food and eating ? Y  Comment Wife manages all meals.  Using the Toilet? N  In the past six months, have you accidently leaked urine? N  Do you have problems with loss of bowel control? N  Managing your Medications? Y  Comment Wife takes care of medications.  Managing your Finances? Y  Comment Son manages finances.  Housekeeping or managing your Housekeeping? Y  Comment Wife takes care of all cleaning.  Some recent data might be hidden    Patient Care Team: Birdie Sons, MD as PCP - General (Family Medicine) Corey Skains, MD as Consulting Physician (Internal Medicine) Estill Cotta, MD as Consulting Physician (Ophthalmology) Murlean Iba, MD as Consulting Physician (Internal Medicine)  Indicate any recent Medical Services you may have received from other than [facility] providers in the past year (date may be approximate).     Assessment:   This is a routine wellness examination for Stelios.  Hearing/Vision screen No exam data present  Dietary issues and exercise activities discussed: Current Exercise Habits: The patient does not participate in regular exercise at present, Exercise limited by: orthopedic condition(s)  Goals    . diet     Recommend cutting down on portion size with daily meals.     Marland Kitchen DIET - INCREASE WATER INTAKE     Recommend increasing water intake to 6 glasses a day.     . Prevent falls     Recommend to remove any items from the home that may cause slips or trips.      Depression Screen PHQ 2/9 Scores 12/28/2019 12/22/2018 07/22/2018 10/22/2017 12/31/2016 10/14/2016 10/14/2016  PHQ - 2 Score 4 0 0 0 0 0 0  PHQ- 9 Score 12 - - - - 1 -    Fall Risk Fall Risk  12/28/2019  06/27/2019 12/22/2018 07/22/2018 10/22/2017  Falls in the past year? 1 1 0 1 No  Number falls in past yr: 0 1 - 0 -  Comment - - - - -  Injury with Fall? 0 0 - 0 -  Risk for fall due to : Impaired balance/gait;Impaired mobility - - - -  Follow up Falls prevention discussed - - - -  Any stairs in or around the home? Yes  If so, are there any without handrails? No  Home free of loose throw rugs in walkways, pet beds, electrical cords, etc? Yes  Adequate lighting in your home to reduce risk of falls? Yes   ASSISTIVE DEVICES UTILIZED TO PREVENT FALLS:  Life alert? No  Use of a cane, walker or w/c? Yes  Grab bars in the bathroom? Yes  Shower chair or bench in shower? Yes  Elevated toilet seat or a handicapped toilet? No    Cognitive Function: Declined today.     6CIT Screen 10/22/2017 10/14/2016  What Year? 0 points 0 points  What month? 0 points 0 points  What time? 0 points 0 points  Count back from 20 0 points 0 points  Months in reverse 2 points 2 points  Repeat phrase 0 points 6 points  Total Score 2 8    Immunizations Immunization History  Administered Date(s) Administered  . Fluad Quad(high Dose 65+) 02/07/2019  . Influenza, High Dose Seasonal PF 06/17/2016, 04/08/2017, 02/21/2018  . PFIZER SARS-COV-2 Vaccination 07/21/2019, 08/15/2019  . PPD Test 12/03/2016  . Pneumococcal Conjugate-13 02/06/2014  . Pneumococcal Polysaccharide-23 03/19/2003  . Zoster 02/06/2014    TDAP status: Due, Education has been provided regarding the importance of this vaccine. Advised may receive this vaccine at local pharmacy or Health Dept. Aware to provide a copy of the vaccination record if obtained from local pharmacy or Health Dept. Verbalized acceptance and understanding. Flu Vaccine status: Up to date Pneumococcal vaccine status: Up to date Covid-19 vaccine status: Completed vaccines  Qualifies for Shingles Vaccine? Yes   Zostavax completed Yes   Shingrix Completed?: No.     Education has been provided regarding the importance of this vaccine. Patient has been advised to call insurance company to determine out of pocket expense if they have not yet received this vaccine. Advised may also receive vaccine at local pharmacy or Health Dept. Verbalized acceptance and understanding.  Screening Tests Health Maintenance  Topic Date Due  . INFLUENZA VACCINE  12/24/2019  . TETANUS/TDAP  05/25/2026 (Originally 06/23/1954)  . COVID-19 Vaccine  Completed  . PNA vac Low Risk Adult  Completed    Health Maintenance  Health Maintenance Due  Topic Date Due  . INFLUENZA VACCINE  12/24/2019    Colorectal cancer screening: No longer required.   Lung Cancer Screening: (Low Dose CT Chest recommended if Age 40-80 years, 30 pack-year currently smoking OR have quit w/in 15years.) does not qualify.    Additional Screening:  Vision Screening: Recommended annual ophthalmology exams for early detection of glaucoma and other disorders of the eye. Is the patient up to date with their annual eye exam?  Yes  Who is the provider or what is the name of the office in which the patient attends annual eye exams? Dr Dingeldein @ [facility] If pt is not established with a provider, would they like to be referred to a provider to establish care? No .   Dental Screening: Recommended annual dental exams for proper oral hygiene  Community Resource Referral / Chronic Care Management: CRR required this visit?  No   CCM required this visit?  No      Plan:     I have personally reviewed and noted the following in the patient's chart:   . Medical and social history . Use of alcohol, tobacco or illicit drugs  . Current medications and supplements . Functional ability and status . Nutritional status .  Physical activity . Advanced directives . List of other physicians . Hospitalizations, surgeries, and ER visits in previous 12 months . Vitals . Screenings to include cognitive,  depression, and falls . Referrals and appointments  In addition, I have reviewed and discussed with patient certain preventive protocols, quality metrics, and best practice recommendations. A written personalized care plan for preventive services as well as general preventive health recommendations were provided to patient.     Mahli Glahn [place], [place]   01/24/4154   Nurse Notes: None.

## 2019-12-27 NOTE — Telephone Encounter (Signed)
Copied from [place] [id number]. Topic: General - Other >> Dec 27, 2019  8:54 AM Rainey Pines A wrote: Patient called to get message to Dr. Caryn Section is regards to his pain medication. Patient stated that his pain is getting worse everyday and has now traveled to his legs and he feels he cannot walk due to pain. Patient has 3 piils left and will not last him for the day. Patient would like a callback as soon as possible. Please advise

## 2019-12-27 NOTE — Telephone Encounter (Signed)
Patient called to inquire why Dr Caryn Section have yet to call in Rx refill on oxyCODONE (ROXICODONE) 15 MG immediate release tablet states that he is in terrible pain and need meds today

## 2019-12-28 ENCOUNTER — Other Ambulatory Visit: Payer: Self-pay

## 2019-12-28 ENCOUNTER — Ambulatory Visit (INDEPENDENT_AMBULATORY_CARE_PROVIDER_SITE_OTHER): Payer: Medicare Other

## 2019-12-28 DIAGNOSIS — Z0000 Encounter for general adult medical examination without abnormal findings: Secondary | ICD-10-CM

## 2019-12-28 MED ORDER — GABAPENTIN 300 MG PO CAPS: 300.0000 mg | ORAL_CAPSULE | Freq: Two times a day (BID) | ORAL | 1 refills | Status: DC

## 2019-12-28 MED ORDER — OXYCODONE HCL 15 MG PO TABS: 15.0000 mg | ORAL_TABLET | ORAL | 0 refills | Status: DC | PRN

## 2019-12-28 NOTE — Patient Instructions (Addendum)
Vincent Mason , Thank you for taking time to come for your Medicare Wellness Visit. I appreciate your ongoing commitment to your health goals. Please review the following plan we discussed and let me know if I can assist you in the future.   Screening recommendations/referrals: Colonoscopy: No longer required.  Recommended yearly ophthalmology/optometry visit for glaucoma screening and checkup Recommended yearly dental visit for hygiene and checkup  Vaccinations: Influenza vaccine: Due fall 2021 Pneumococcal vaccine: Completed series Tdap vaccine: Currently due, declined at this time. Shingles vaccine: Shingrix discussed. Please contact your pharmacy for coverage information.     Advanced directives: Please bring a copy of your POA (Power of Attorney) and/or Living Will to your next appointment.   Conditions/risks identified: Fall risk preventatives discussed today. Recommend to increase water intake to 6-8 8 oz glasses a day.   Next appointment: 04/02/20 @ 2:00 PM with Dr Caryn Section   Preventive Care 82 Years and Older, Male Preventive care refers to lifestyle choices and visits with your health care provider that can promote health and wellness. What does preventive care include?  A yearly physical exam. This is also called an annual well check.  Dental exams once or twice a year.  Routine eye exams. Ask your health care provider how often you should have your eyes checked.  Personal lifestyle choices, including:  Daily care of your teeth and gums.  Regular physical activity.  Eating a healthy diet.  Avoiding tobacco and drug use.  Limiting alcohol use.  Practicing safe sex.  Taking low doses of aspirin every day.  Taking vitamin and mineral supplements as recommended by your health care provider. What happens during an annual well check? The services and screenings done by your health care provider during your annual well check will depend on your age, overall health,  lifestyle risk factors, and family history of disease. Counseling  Your health care provider may ask you questions about your:  Alcohol use.  Tobacco use.  Drug use.  Emotional well-being.  Home and relationship well-being.  Sexual activity.  Eating habits.  History of falls.  Memory and ability to understand (cognition).  Work and work Statistician. Screening  You may have the following tests or measurements:  Height, weight, and BMI.  Blood pressure.  Lipid and cholesterol levels. These may be checked every 5 years, or more frequently if you are over 32 years old.  Skin check.  Lung cancer screening. You may have this screening every year starting at age 35 if you have a 30-pack-year history of smoking and currently smoke or have quit within the past 15 years.  Fecal occult blood test (FOBT) of the stool. You may have this test every year starting at age 47.  Flexible sigmoidoscopy or colonoscopy. You may have a sigmoidoscopy every 5 years or a colonoscopy every 10 years starting at age 70.  Prostate cancer screening. Recommendations will vary depending on your family history and other risks.  Hepatitis C blood test.  Hepatitis B blood test.  Sexually transmitted disease (STD) testing.  Diabetes screening. This is done by checking your blood sugar (glucose) after you have not eaten for a while (fasting). You may have this done every 1-3 years.  Abdominal aortic aneurysm (AAA) screening. You may need this if you are a current or former smoker.  Osteoporosis. You may be screened starting at age 15 if you are at high risk. Talk with your health care provider about your test results, treatment options, and if necessary,  the need for more tests. Vaccines  Your health care provider may recommend certain vaccines, such as:  Influenza vaccine. This is recommended every year.  Tetanus, diphtheria, and acellular pertussis (Tdap, Td) vaccine. You may need a Td booster  every 10 years.  Zoster vaccine. You may need this after age 46.  Pneumococcal 13-valent conjugate (PCV13) vaccine. One dose is recommended after age 33.  Pneumococcal polysaccharide (PPSV23) vaccine. One dose is recommended after age 23. Talk to your health care provider about which screenings and vaccines you need and how often you need them. This information is not intended to replace advice given to you by your health care provider. Make sure you discuss any questions you have with your health care provider. Document Released: 06/07/2015 Document Revised: 01/29/2016 Document Reviewed: 03/12/2015 Elsevier Interactive Patient Education  2017 [place] Prevention in the Home Falls can cause injuries. They can happen to people of all ages. There are many things you can do to make your home safe and to help prevent falls. What can I do on the outside of my home?  Regularly fix the edges of walkways and driveways and fix any cracks.  Remove anything that might make you trip as you walk through a door, such as a raised step or threshold.  Trim any bushes or trees on the path to your home.  Use bright outdoor lighting.  Clear any walking paths of anything that might make someone trip, such as rocks or tools.  Regularly check to see if handrails are loose or broken. Make sure that both sides of any steps have handrails.  Any raised decks and porches should have guardrails on the edges.  Have any leaves, snow, or ice cleared regularly.  Use sand or salt on walking paths during winter.  Clean up any spills in your garage right away. This includes oil or grease spills. What can I do in the bathroom?  Use night lights.  Install grab bars by the toilet and in the tub and shower. Do not use towel bars as grab bars.  Use non-skid mats or decals in the tub or shower.  If you need to sit down in the shower, use a plastic, non-slip stool.  Keep the floor dry. Clean up any  water that spills on the floor as soon as it happens.  Remove soap buildup in the tub or shower regularly.  Attach bath mats securely with double-sided non-slip rug tape.  Do not have throw rugs and other things on the floor that can make you trip. What can I do in the bedroom?  Use night lights.  Make sure that you have a light by your bed that is easy to reach.  Do not use any sheets or blankets that are too big for your bed. They should not hang down onto the floor.  Have a firm chair that has side arms. You can use this for support while you get dressed.  Do not have throw rugs and other things on the floor that can make you trip. What can I do in the kitchen?  Clean up any spills right away.  Avoid walking on wet floors.  Keep items that you use a lot in easy-to-reach places.  If you need to reach something above you, use a strong step stool that has a grab bar.  Keep electrical cords out of the way.  Do not use floor polish or wax that makes floors slippery. If you must use  wax, use non-skid floor wax.  Do not have throw rugs and other things on the floor that can make you trip. What can I do with my stairs?  Do not leave any items on the stairs.  Make sure that there are handrails on both sides of the stairs and use them. Fix handrails that are broken or loose. Make sure that handrails are as long as the stairways.  Check any carpeting to make sure that it is firmly attached to the stairs. Fix any carpet that is loose or worn.  Avoid having throw rugs at the top or bottom of the stairs. If you do have throw rugs, attach them to the floor with carpet tape.  Make sure that you have a light switch at the top of the stairs and the bottom of the stairs. If you do not have them, ask someone to add them for you. What else can I do to help prevent falls?  Wear shoes that:  Do not have high heels.  Have rubber bottoms.  Are comfortable and fit you well.  Are closed  at the toe. Do not wear sandals.  If you use a stepladder:  Make sure that it is fully opened. Do not climb a closed stepladder.  Make sure that both sides of the stepladder are locked into place.  Ask someone to hold it for you, if possible.  Clearly mark and make sure that you can see:  Any grab bars or handrails.  First and last steps.  Where the edge of each step is.  Use tools that help you move around (mobility aids) if they are needed. These include:  Canes.  Walkers.  Scooters.  Crutches.  Turn on the lights when you go into a dark area. Replace any light bulbs as soon as they burn out.  Set up your furniture so you have a clear path. Avoid moving your furniture around.  If any of your floors are uneven, fix them.  If there are any pets around you, be aware of where they are.  Review your medicines with your doctor. Some medicines can make you feel dizzy. This can increase your chance of falling. Ask your doctor what other things that you can do to help prevent falls. This information is not intended to replace advice given to you by your health care provider. Make sure you discuss any questions you have with your health care provider. Document Released: 03/07/2009 Document Revised: 10/17/2015 Document Reviewed: 06/15/2014 Elsevier Interactive Patient Education  2017 [employer].

## 2019-12-28 NOTE — Telephone Encounter (Signed)
Have sent rf for oxycodone which is to last him until his appt at the pain clinic next week so long as he takes according to directions on prescription. Have also sent prescription for gabapentin which he needs to take to help with pain control.

## 2020-01-01 ENCOUNTER — Telehealth: Payer: Self-pay

## 2020-01-01 NOTE — Telephone Encounter (Signed)
Called to confirm appt. For tomorrow. Instructed to bring medication and to make sure he has history form filled out.

## 2020-01-02 ENCOUNTER — Other Ambulatory Visit: Payer: Self-pay | Admitting: Family Medicine

## 2020-01-02 ENCOUNTER — Other Ambulatory Visit: Payer: Self-pay

## 2020-01-02 ENCOUNTER — Telehealth: Payer: Self-pay

## 2020-01-02 ENCOUNTER — Ambulatory Visit
Payer: Medicare Other | Attending: Student in an Organized Health Care Education/Training Program | Admitting: Student in an Organized Health Care Education/Training Program

## 2020-01-02 ENCOUNTER — Encounter: Payer: Self-pay | Admitting: Student in an Organized Health Care Education/Training Program

## 2020-01-02 VITALS — BP 147/81 | HR 111 | Temp 98.0°F | Resp 18 | Ht 70.0 in | Wt 167.4 lb

## 2020-01-02 DIAGNOSIS — M47816 Spondylosis without myelopathy or radiculopathy, lumbar region: Secondary | ICD-10-CM | POA: Diagnosis not present

## 2020-01-02 DIAGNOSIS — M5136 Other intervertebral disc degeneration, lumbar region: Secondary | ICD-10-CM

## 2020-01-02 DIAGNOSIS — G894 Chronic pain syndrome: Secondary | ICD-10-CM | POA: Insufficient documentation

## 2020-01-02 DIAGNOSIS — S32030A Wedge compression fracture of third lumbar vertebra, initial encounter for closed fracture: Secondary | ICD-10-CM | POA: Insufficient documentation

## 2020-01-02 DIAGNOSIS — M546 Pain in thoracic spine: Secondary | ICD-10-CM | POA: Insufficient documentation

## 2020-01-02 DIAGNOSIS — S32030S Wedge compression fracture of third lumbar vertebra, sequela: Secondary | ICD-10-CM | POA: Insufficient documentation

## 2020-01-02 DIAGNOSIS — F1120 Opioid dependence, uncomplicated: Secondary | ICD-10-CM | POA: Diagnosis present

## 2020-01-02 MED ORDER — ORPHENADRINE CITRATE 30 MG/ML IJ SOLN
30.0000 mg | Freq: Once | INTRAMUSCULAR | Status: AC
  Administered 2020-01-02: 30 mg via INTRAMUSCULAR

## 2020-01-02 MED ORDER — ORPHENADRINE CITRATE 30 MG/ML IJ SOLN
INTRAMUSCULAR | Status: AC
  Filled 2020-01-02: qty 2

## 2020-01-02 MED ORDER — OXYCODONE HCL 15 MG PO TABS: 15.0000 mg | ORAL_TABLET | ORAL | 0 refills | Status: DC | PRN

## 2020-01-02 NOTE — Telephone Encounter (Signed)
Copied from [place] [id number]. Topic: General - Other >> Jan 02, 2020  2:11 PM Rainey Pines A wrote: Patient went to [facility] pain center and they are able to operate on patient but have to check with patients cardiologist to find out how patients blood thinner. Patient needs Dr. Caryn Section to continue his medication for the next four weeks 15mg  oxycodone . Please advise

## 2020-01-02 NOTE — Assessment & Plan Note (Signed)
Can consider diagnostic lumbar facet medial branch nerve blocks at L3, L4, L5.  Will need cardiac clearance to stop Eliquis prior to procedure.  Patient wants to hold off and will contact us if he would like to pursue this

## 2020-01-02 NOTE — Progress Notes (Signed)
Safety precautions to be maintained throughout the outpatient stay will include: orient to surroundings, keep bed in low position, maintain call bell within reach at all times, provide assistance with transfer out of bed and ambulation.   Patient unable to confirm or deny any specific meds today that he is currently taking except for Oxycodone.

## 2020-01-02 NOTE — Telephone Encounter (Signed)
Requested medication (s) are due for refill today: yes  Requested medication (s) are on the active medication list: yes  Last refill:  12/28/19  Future visit scheduled: yes  Notes to clinic:  med not delegated to NT to RF/refuse   Requested Prescriptions  Pending Prescriptions Disp Refills   oxyCODONE (ROXICODONE) 15 MG immediate release tablet 30 tablet 0    Sig: Take 1 tablet (15 mg total) by mouth every 4 (four) hours as needed for pain. Not to exceed 6 tablets in 1 day      Not Delegated - Analgesics:  Opioid Agonists Failed - 01/02/2020  2:26 PM      Failed - This refill cannot be delegated      Failed - Urine Drug Screen completed in last 360 days.      Passed - Valid encounter within last 6 months    Recent Outpatient Visits           2 months ago Essential hypertension   [facility] Birdie Sons, MD   6 months ago Uncomplicated opioid dependence [facility])   [facility] Birdie Sons, MD   7 months ago Essential hypertension   [facility] Birdie Sons, MD   10 months ago Internal hemorrhoid   [place], Vickki Muff, [place]   10 months ago Annual physical exam   [facility] Birdie Sons, MD

## 2020-01-02 NOTE — Telephone Encounter (Signed)
Patient advised and verbalized understanding 

## 2020-01-02 NOTE — Progress Notes (Signed)
Patient: Vincent Mason  Service Category: E/M  Provider: Gillis Santa, MD  DOB: [birth year]  DOS: 01/02/2020  Referring Provider: Birdie Sons, MD  MRN: [id number]  Setting: Ambulatory outpatient  PCP: Birdie Sons, MD  Type: New Patient  Specialty: Interventional Pain Management    Location: Office  Delivery: Face-to-face     Primary Reason(s) for Visit: Encounter for initial evaluation of one or more chronic problems (new to examiner) potentially causing chronic pain, and posing a threat to normal musculoskeletal function. (Level of risk: High) CC: Back Pain (lower)  HPI  Vincent Mason is a 84 y.o. year old, male patient, who comes for the first time to our practice referred by Birdie Sons, MD [address],  [place] [address], for our initial evaluation of his chronic pain. He has Back pain; Seizure disorder ([place]); Obstructive sleep apnea; Hyperlipidemia; Leg swelling; Lumbar degenerative disc disease; Coronary artery disease; Chronic kidney disease (CKD), stage III (moderate); Chronic constipation; COPD (chronic obstructive pulmonary disease) ([place]); ED (erectile dysfunction) of organic origin; Lymphedema of both lower extremities; Fatigue; Blood in the urine; Calculus of kidney; Memory loss; Hypertensive pulmonary vascular disease ([place]); Displacement of cervical intervertebral disc without myelopathy; TI (tricuspid incompetence); Hyperuricemia; Hypokalemia; Normocytic anemia; Opioid type dependence, continuous ([place]); Chronic atrial fibrillation ([place]); Essential hypertension; Fecal occult blood test positive; Nodular hyperplasia of liver; Hypomagnesemia; Blood in stool; Anemia due to chronic blood loss; Altered mental status; Vitamin D deficiency; Neuropathy; Seizure ([place]); Biliary cirrhosis ([place]); Erectile dysfunction due to arterial insufficiency; Restless leg syndrome; Lumbar spondylosis; Compression fracture of third lumbar vertebra ([place]); Discogenic thoracic pain; and  Chronic pain syndrome on their problem list. Today he comes in for evaluation of his Back Pain (lower)  Pain Assessment: Location: Lower Back Radiating: has recently begun to move down backs of legs to calves Onset:  ("about a month") Duration: Chronic pain Quality: Aching Severity: 9 /10 (subjective, self-reported pain score)  Effect on ADL: limits daily activities, now using walker and unable to sleep well Timing: Constant Modifying factors: oxycodone helped until pt ran out BP: (!) 147/81  HR: (!) 111  Onset and Duration: Date of injury: 07/21 Cause of pain: Unknown Severity: NAS-11 at its worse: 10/10, NAS-11 now: 10/10 and NAS-11 on the average: 10/10 Timing: Not influenced by the time of the day Aggravating Factors: Bending, Kneeling, Lifiting, Motion, Squatting, Stooping , Twisting, Walking and Walking uphill Alleviating Factors: Standing and Using a brace Associated Problems: Pain that wakes patient up and Pain that does not allow patient to sleep Quality of Pain: Sharp and Stabbing Previous Examinations or Tests: The patient denies none listed Previous Treatments: The patient denies none listed  Patient presents today complaining of mid back, low back pain with radiation to bilateral posterior lateral legs and lateral calf region.  This is been going on for many years but got worse about a month ago when he fell off a ladder.  He has been seeing Dr. Caryn Section for pain management and has been fairly stable on oxycodone 15 mg up to 4 times a day as needed.  There is some documentation/notes that the patient may have been over utilizing his medications.  Of note, patient is somewhat difficult to understand and is not very clear about his past medical history.  He states that he presented to the emergency department on 11/27/2019 for mid back pain which revealed a compression fracture at L3.  Patient was offered referral to orthopedics to consider kyphoplasty  but the patient declined  stating "cement in the back does not sound good".  The patient has been told in the past that he needs lumbar spine surgery but he has friends and family members that did not have favorable results after lumbar spine surgery so was not keen on moving forward with this.  He states that he is not interested in injection therapy.  He keeps repeating that he would like for his pain to go away.  Patient is unclear about medications that he has tried in the past.  He states that his wife should be accompanying him and that she should be here soon however after waiting 30 minutes, we decided to proceed with visit in the absence of his wife.  Historic Controlled Substance Pharmacotherapy Review   12/25/2019  1   12/25/2019  Oxycodone Hcl 15 MG Tablet  20.00  5 Do Fis   [id number]   [place] (4800)   0/0  90.00 MME  Medicare   [place]  12/22/2019  1   12/22/2019  Oxycodone Hcl 15 MG Tablet  12.00  3 Do Fis   [id number]   Gib (4800)   0/0  90.00 MME  Medicare   [place]  12/20/2019  1   12/20/2019  Oxycodone Hcl 15 MG Tablet  8.00  2 Do Fis   [id number]   [place] (4800)   0/0  90.00 MME  Private Pay        Historical Monitoring: The patient  reports no history of drug use. List of all UDS Test(s): Lab Results  Component Value Date   MDMA NONE DETECTED 08/06/2017   MDMA NONE DETECTED 01/21/2017   MDMA NONE DETECTED 12/02/2016   MDMA NONE DETECTED 01/17/2015   MDMA NEGATIVE 08/30/2011   COCAINSCRNUR NONE DETECTED 08/06/2017   COCAINSCRNUR NONE DETECTED 01/21/2017   COCAINSCRNUR NONE DETECTED 12/02/2016   COCAINSCRNUR NONE DETECTED 09/03/2015   COCAINSCRNUR NONE DETECTED 01/17/2015   COCAINSCRNUR NEGATIVE 08/30/2011   PCPSCRNUR NONE DETECTED 08/06/2017   PCPSCRNUR NONE DETECTED 01/21/2017   PCPSCRNUR NONE DETECTED 12/02/2016   PCPSCRNUR NONE DETECTED 01/17/2015   PCPSCRNUR NEGATIVE 08/30/2011   THCU NONE DETECTED 08/06/2017   THCU NONE DETECTED 01/21/2017   THCU NONE DETECTED 12/02/2016   THCU NONE DETECTED 09/03/2015    THCU NONE DETECTED 01/17/2015   THCU NEGATIVE 08/30/2011   ETH <10 08/06/2017   ETH <5 01/21/2017   ETH <5 09/03/2015   List of other Serum/Urine Drug Screening Test(s):  Lab Results  Component Value Date   COCAINSCRNUR NONE DETECTED 08/06/2017   COCAINSCRNUR NONE DETECTED 01/21/2017   COCAINSCRNUR NONE DETECTED 12/02/2016   COCAINSCRNUR NONE DETECTED 09/03/2015   COCAINSCRNUR NONE DETECTED 01/17/2015   COCAINSCRNUR NEGATIVE 08/30/2011   THCU NONE DETECTED 08/06/2017   THCU NONE DETECTED 01/21/2017   THCU NONE DETECTED 12/02/2016   THCU NONE DETECTED 09/03/2015   THCU NONE DETECTED 01/17/2015   THCU NEGATIVE 08/30/2011   ETH <10 08/06/2017   ETH <5 01/21/2017   ETH <5 09/03/2015   Historical Background Evaluation: [place] PMP: PDMP reviewed during this encounter. Online review of the past 30-monthperiod conducted.             [place] Department of public safety, offender search: (Editor, commissioningInformation) Non-contributory Risk Assessment Profile: Aberrant behavior: None observed or detected today Risk factors for fatal opioid overdose: None identified today Fatal overdose hazard ratio (HR): Calculation deferred Non-fatal overdose hazard ratio (HR): 3.73 for 50-99 MME/day Risk of opioid abuse or dependence:  0.7-3.0% with doses ? 36 MME/day and 6.1-26% with doses ? 120 MME/day. Substance use disorder (SUD) risk level: See below Personal History of Substance Abuse (SUD-Substance use disorder):  Alcohol: Negative  Illegal Drugs: Negative  Rx Drugs: Negative  ORT Risk Level calculation: Low Risk  Opioid Risk Tool - 01/02/20 1109      Family History of Substance Abuse   Alcohol Negative    Illegal Drugs Negative    Rx Drugs Negative      Personal History of Substance Abuse   Alcohol Negative    Illegal Drugs Negative    Rx Drugs Negative      Age   Age between 66-45 years  No      History of Preadolescent Sexual Abuse   History of Preadolescent Sexual Abuse Negative or Male       Psychological Disease   Psychological Disease Negative    Depression Negative      Total Score   Opioid Risk Tool Scoring 0    Opioid Risk Interpretation Low Risk          ORT Scoring interpretation table:  Score <3 = Low Risk for SUD  Score between 4-7 = Moderate Risk for SUD  Score >8 = High Risk for Opioid Abuse    Pharmacologic Plan: No opioid analgesics.           Please see below in assessment and plan but had an extensive discussion with the patient regarding the phenomena of opioid-induced hyperalgesia and central sensitization.  I informed him that our clinic policy for morphine milliequivalents is 60 and the patient is currently on 90 morphine mill equivalents.  If you would like to be considered for chronic opioid therapy here, I instructed the patient that he will need to wean down to 10 mg every 6 hours as needed, 40 mg daily of oxycodone which is equivalent to 60 MME.  Patient was not interested in opioid wean.   Meds   Current Outpatient Medications:  .  oxyCODONE (ROXICODONE) 15 MG immediate release tablet, Take 1 tablet (15 mg total) by mouth every 4 (four) hours as needed for pain. Not to exceed 6 tablets in 1 day, Disp: 30 tablet, Rfl: 0 .  allopurinol (ZYLOPRIM) 100 MG tablet, Take 1 tablet (100 mg total) by mouth 2 (two) times daily. TAKE IN PLACE OF FEBUXOSTAT, Disp: 180 tablet, Rfl: 3 .  apixaban (ELIQUIS) 5 MG TABS tablet, Take 1/2 tablet twice a day (Patient taking differently: 5 mg 2 (two) times daily. ), Disp: , Rfl:  .  aspirin EC 81 MG tablet, Take 81 mg by mouth daily., Disp: , Rfl:  .  Cholecalciferol (VITAMIN D3) 1000 units CAPS, Take 2,000 Units by mouth daily. , Disp: , Rfl:  .  digoxin (LANOXIN) 0.125 MG tablet, Take 0.125 mg by mouth daily., Disp: , Rfl:  .  divalproex (DEPAKOTE) 250 MG DR tablet, Take 1 tablet by mouth 2 (two) times daily., Disp: , Rfl:  .  ferrous sulfate 325 (65 FE) MG tablet, Take 1 tablet (325 mg total) by mouth daily., Disp: 60  tablet, Rfl: 3 .  hydrocortisone cream 1 %, Apply 1 application topically 2 (two) times daily. As needed, Disp: , Rfl:  .  levETIRAcetam (KEPPRA) 500 MG tablet, Take 1 tablet by mouth 2 (two) times daily., Disp: , Rfl:  .  metoprolol succinate (TOPROL-XL) 25 MG 24 hr tablet, TAKE HALF A TABLET BY MOUTH DAILY, Disp: 30 tablet, Rfl: 12 .  Multiple Vitamins-Minerals (ALIVE MENS ENERGY) TABS, Take 1 tablet by mouth daily., Disp: , Rfl:  .  NONFORMULARY OR COMPOUNDED ITEM, Trimix (30/1/10)-(Pap/Phent/PGE) Dosage: 6m test dose vial 3cc size vial Qty1 [phone] Disp: 1 each, Rfl: 0 .  polyethylene glycol (MIRALAX / GLYCOLAX) packet, Take 17 g by mouth daily., Disp: 14 each, Rfl: 0 .  PROCTOZONE-HC 2.5 % rectal cream, PLACE 1 APPLICATION RECTALLY TWICE DAILYAS DIRECTED, Disp: 30 g, Rfl: 0 .  rosuvastatin (CRESTOR) 10 MG tablet, Take 1 tablet (10 mg total) by mouth daily., Disp: 90 tablet, Rfl: 3 .  spironolactone (ALDACTONE) 25 MG tablet, Take 1 tablet by mouth daily., Disp: , Rfl:  .  torsemide (DEMADEX) 20 MG tablet, Take one daily as needed for excessive swelling, Disp: , Rfl:   Imaging Review  Cervical Imaging:  Cervical CT wo contrast: Results for orders placed during the hospital encounter of 09/03/15  CT Cervical Spine Wo Contrast  Narrative CLINICAL DATA:  Unresponsive.  EXAM: CT HEAD WITHOUT CONTRAST  CT CERVICAL SPINE WITHOUT CONTRAST  TECHNIQUE: Multidetector CT imaging of the head and cervical spine was performed following the standard protocol without intravenous contrast. Multiplanar CT image reconstructions of the cervical spine were also generated.  COMPARISON:  01/17/2015  FINDINGS: CT HEAD FINDINGS  There is no intracranial hemorrhage, mass or evidence of acute infarction. There is no extra-axial fluid collection. There is moderate generalized atrophy. Possible remote lacunar infarctions in the left pons and left  basal ganglia.  No bony abnormality.  CT CERVICAL SPINE FINDINGS  The vertebral column, pedicles and facet articulations are intact. There is no evidence of acute fracture. No acute soft tissue abnormalities are evident.  Mild motion degradation of the images.  Endotracheal tube noted.  IMPRESSION: 1. No acute intracranial findings. Mild generalized atrophy and remote lacunar infarctions, unchanged. 2. Negative for acute cervical spine fracture. Mildly motion degraded study. Critical Value/emergent results were called by telephone at the time of interpretation on 09/03/2015 at 3:39 am to Dr. CWallie Char who verbally acknowledged these results.   Electronically Signed By: DAndreas NewportM.D. On: 09/03/2015 03:40   Narrative FINDINGS CLINICAL DATA:  CERVICAL FUSION, C5-6 LATERAL VIEWS IN NEUTRAL, FLEXION, AND EXTENSION ARE CORRELATED WITH INTRAOPERATIVE VIEWS FROM 06/06/1998.  I DO NOT HAVE ANY ADDITIONAL PRIOR STUDIES FOR CORRELATION.  THERE HAS BEEN INTERVAL INFERIOR DISPLACEMENT OF THE BONE PLUG INTO THE SUPERIOR END PLATE OF THE C6 VERTEBRAL BODY.  THIS IS ASSOCIATED WITH A SMALL BONY FRAGMENT DISPLACED ANTERIORLY FROM THE VERTEBRAL BODY.  THERE IS NARROWING OF THE C5-6 DISK SPACE WITH A SLIGHT KYPHOSIS AT THIS LEVEL IN THE NEUTRAL POSITION. THIS KYPHOSIS INCREASES WITH FLEXION AND IS ASSOCIATED WITH SOME FANNING OF THE SPINOUS PROCESSES AND WIDENING OF THE FACETJOINTS.  ADDITIONAL DISK SPACES APPEAR NORMAL. IMPRESSION THERE HAS BEEN INTERVAL SETTLING OF THE BONE PLUG INTO THE SUPERIOR ASPECT OF THE C6 VERTEBRAL BODY AND THERE APPEARS TO BE A SMALL FRACTURE INVOLVING THE ANTERIOR SUPERIOR CORNER OF THE C6 VERTEBRAL BODY.  FLEXION EXTENSION VIEWS SUGGEST SOME INSTABILITY. THE PATIENT IS TO FOLLOW-UP WITH DR. POOL TODAY.   Narrative CLINICAL DATA:  Low back pain after cleaning gutters with shooting pain down both legs.  EXAM: LUMBAR SPINE - 2-3  VIEW  COMPARISON:  None.  FINDINGS: Slight curvature convex left. Moderate spondylosis throughout the lumbar spine to include moderate facet arthropathy. Mild grade 1 anterolisthesis of L4 on L5 likely due to the moderate facet  arthropathy. Mild grade 1 retrolisthesis of L2 on L3 likely due to facet arthropathy. Moderate disc space narrowing at the L1-2 level and T12-L1 level. Mild anterior wedge compression deformity involving T12 likely chronic but age indeterminate. This was not present 2019. Mild-to-moderate L3 compression fracture likely chronic and not present in 2017.  IMPRESSION: 1. Moderate L3 compression fracture age indeterminate, but new since 2017. Mild compression deformity of T12 age indeterminate, but new since 2019.  2. Moderate spondylosis of the lumbar spine with disc disease as described. Mild grade 1 anterolisthesis of L4 on L5 due to the moderate facet arthropathy. Grade 1 retrolisthesis of L2 on L3 likely due to the moderate facet arthropathy.   Electronically Signed By: Marin Olp M.D. On: 11/27/2019 17:43   Narrative CLINICAL DATA:  Left thigh pain today without reported injury.  EXAM: DG HIP (WITH OR WITHOUT PELVIS) 2-3V LEFT  COMPARISON:  None.  FINDINGS: There is no evidence of acute fracture or hip dislocation. There is at most mild hip joint space narrowing and acetabular spurring bilaterally. Atherosclerotic vascular calcifications are noted.  IMPRESSION: No acute osseous abnormality.   Electronically Signed By: Logan Bores M.D. On: 08/06/2017 14:45    Complexity Note: Imaging results reviewed. Results shared with Vincent Mason, using Layman's terms.                         ROS  Cardiovascular: Heart trouble and Heart surgery Pulmonary or Respiratory: No reported pulmonary signs or symptoms such as wheezing and difficulty taking a deep full breath (Asthma), difficulty blowing air out (Emphysema), coughing up mucus (Bronchitis),  persistent dry cough, or temporary stoppage of breathing during sleep Neurological: No reported neurological signs or symptoms such as seizures, abnormal skin sensations, urinary and/or fecal incontinence, being born with an abnormal open spine and/or a tethered spinal cord Psychological-Psychiatric: No reported psychological or psychiatric signs or symptoms such as difficulty sleeping, anxiety, depression, delusions or hallucinations (schizophrenial), mood swings (bipolar disorders) or suicidal ideations or attempts Gastrointestinal: No reported gastrointestinal signs or symptoms such as vomiting or evacuating blood, reflux, heartburn, alternating episodes of diarrhea and constipation, inflamed or scarred liver, or pancreas or irrregular and/or infrequent bowel movements Genitourinary: No reported renal or genitourinary signs or symptoms such as difficulty voiding or producing urine, peeing blood, non-functioning kidney, kidney stones, difficulty emptying the bladder, difficulty controlling the flow of urine, or chronic kidney disease Hematological: No reported hematological signs or symptoms such as prolonged bleeding, low or poor functioning platelets, bruising or bleeding easily, hereditary bleeding problems, low energy levels due to low hemoglobin or being anemic Endocrine: No reported endocrine signs or symptoms such as high or low blood sugar, rapid heart rate due to high thyroid levels, obesity or weight gain due to slow thyroid or thyroid disease Rheumatologic: No reported rheumatological signs and symptoms such as fatigue, joint pain, tenderness, swelling, redness, heat, stiffness, decreased range of motion, with or without associated rash Musculoskeletal: Negative for myasthenia gravis, muscular dystrophy, multiple sclerosis or malignant hyperthermia Work History: Retired  Allergies  Vincent Mason has No Known Allergies.  Laboratory Chemistry Profile   Renal Lab Results  Component Value Date    BUN 40 (H) 11/27/2019   CREATININE 2.03 (H) 11/27/2019   LABCREA 55.43 09/03/2015   BCR 12 05/12/2019   GFRAA 34 (L) 11/27/2019   GFRNONAA 30 (L) 11/27/2019   PROTEINUR NEGATIVE 11/27/2019     Electrolytes Lab Results  Component Value Date  NA 140 11/27/2019   K 4.3 11/27/2019   CL 100 11/27/2019   CALCIUM 9.3 11/27/2019   MG 2.4 (H) 02/07/2019   PHOS 4.0 12/15/2016     Hepatic Lab Results  Component Value Date   AST 20 05/12/2019   ALT 12 05/12/2019   ALBUMIN 4.4 05/12/2019   ALKPHOS 43 05/12/2019   AMYLASE 52 10/10/2015   LIPASE 24 01/21/2017   AMMONIA 34 01/21/2017     ID Lab Results  Component Value Date   MRSAPCR NEGATIVE 12/04/2016   HCVAB 0.1 10/10/2015     Bone Lab Results  Component Value Date   VD25OH 39.4 02/07/2019     Endocrine Lab Results  Component Value Date   GLUCOSE 113 (H) 11/27/2019   GLUCOSEU NEGATIVE 11/27/2019   HGBA1C 6.2 (H) 01/17/2015   TSH 4.085 08/07/2017   FREET4 0.38 (L) 01/21/2017   CRTSLPL 53.2 09/03/2015     Neuropathy Lab Results  Component Value Date   VITAMINB12 605 12/15/2016   FOLATE 9.1 12/15/2016   HGBA1C 6.2 (H) 01/17/2015     CNS No results found for: COLORCSF, APPEARCSF, RBCCOUNTCSF, WBCCSF, POLYSCSF, LYMPHSCSF, EOSCSF, PROTEINCSF, GLUCCSF, JCVIRUS, CSFOLI, IGGCSF, LABACHR, ACETBL, LABACHR, ACETBL   Inflammation (CRP: Acute  ESR: Chronic) Lab Results  Component Value Date   LATICACIDVEN 1.0 08/06/2017     Rheumatology Lab Results  Component Value Date   ANA Positive (A) 09/11/2015   LABURIC 7.2 05/12/2019     Coagulation Lab Results  Component Value Date   INR 1.05 08/06/2017   LABPROT 13.6 08/06/2017   APTT 25 01/21/2017   PLT 266 11/27/2019     Cardiovascular Lab Results  Component Value Date   BNP 133.7 (H) 09/29/2017   CKTOTAL 448 (H) 12/04/2016   TROPONINI <0.03 08/06/2017   HGB 10.5 (L) 11/27/2019   HCT 31.6 (L) 11/27/2019     Screening Lab Results  Component Value Date    MRSAPCR NEGATIVE 12/04/2016   HCVAB 0.1 10/10/2015     Cancer No results found for: CEA, CA125, LABCA2   Allergens No results found for: ALMOND, APPLE, ASPARAGUS, AVOCADO, BANANA, BARLEY, BASIL, BAYLEAF, GREENBEAN, LIMABEAN, WHITEBEAN, BEEFIGE, REDBEET, BLUEBERRY, BROCCOLI, CABBAGE, MELON, CARROT, CASEIN, CASHEWNUT, CAULIFLOWER, CELERY     Note: Lab results reviewed.  PFSH  Drug: Vincent Mason  reports no history of drug use. Alcohol:  reports no history of alcohol use. Tobacco:  reports that he has quit smoking. He has never used smokeless tobacco. Medical:  has a past medical history of Acute encephalopathy (09/03/2015), Atrial fibrillation and flutter ([place]) (2013), C. difficile colitis (09/12/2015), COPD (chronic obstructive pulmonary disease) ([place]), HCAP (healthcare-associated pneumonia) (09/12/2015), Hyperlipidemia, Hypertension, Kidney disease, chronic, stage III (moderate, EGFR 30-59 ml/min), OSA (obstructive sleep apnea), and Severe sepsis with septic shock ([place]) (09/12/2015). Family: family history includes Hypertension in his father and mother.  Past Surgical History:  Procedure Laterality Date  . CATARACT EXTRACTION    . CORONARY ARTERY BYPASS GRAFT  10/1997  . history of cervical discectomy  2009   C5-C6  . Hyperplastic colon polyp  02/2002   Sigmoid polyps   Active Ambulatory Problems    Diagnosis Date Noted  . Back pain 11/12/2014  . Seizure disorder ([place]) 01/17/2015  . Obstructive sleep apnea 01/17/2015  . Hyperlipidemia 01/17/2015  . Leg swelling 01/17/2015  . Lumbar degenerative disc disease 01/17/2015  . Coronary artery disease 01/22/2015  . Chronic kidney disease (CKD), stage III (moderate) 01/22/2015  . Chronic constipation 01/22/2015  .  COPD (chronic obstructive pulmonary disease) ([place]) 01/22/2015  . ED (erectile dysfunction) of organic origin 01/22/2015  . Lymphedema of both lower extremities 01/22/2015  . Fatigue 01/22/2015  . Blood in the urine 01/22/2015   . Calculus of kidney 01/22/2015  . Memory loss 01/22/2015  . Hypertensive pulmonary vascular disease ([place]) 01/22/2015  . Displacement of cervical intervertebral disc without myelopathy 01/22/2015  . TI (tricuspid incompetence) 08/02/2014  . Hyperuricemia 07/09/2015  . Hypokalemia 08/26/2015  . Normocytic anemia 09/03/2015  . Opioid type dependence, continuous ([place]) 09/03/2015  . Chronic atrial fibrillation ([place]) 09/12/2015  . Essential hypertension 09/12/2015  . Fecal occult blood test positive 09/12/2015  . Nodular hyperplasia of liver   . Hypomagnesemia 09/14/2015  . Blood in stool   . Anemia due to chronic blood loss   . Altered mental status 01/21/2017  . Vitamin D deficiency 04/08/2017  . Neuropathy 04/08/2017  . Seizure ([place]) 08/06/2017  . Biliary cirrhosis ([place]) 02/21/2018  . Erectile dysfunction due to arterial insufficiency 05/04/2018  . Restless leg syndrome 06/22/2018  . Lumbar spondylosis 01/02/2020  . Compression fracture of third lumbar vertebra ([place]) 01/02/2020  . Discogenic thoracic pain 01/02/2020  . Chronic pain syndrome 01/02/2020   Resolved Ambulatory Problems    Diagnosis Date Noted  . CHF (congestive heart failure) ([place]) 01/17/2015  . Atrial fibrillation ([place]) 01/17/2015  . Hypertension 01/17/2015  . Unresponsiveness 01/17/2015  . CAP (community acquired pneumonia) 01/18/2015  . Dizziness 01/22/2015  . Cramp in muscle 01/22/2015  . Breath shortness 11/14/2013  . Fast heart beat 01/22/2015  . Open leg wound 09/02/2015  . Acute on chronic renal failure ([place]) 09/03/2015  . History of seizure 09/03/2015  . Acute encephalopathy 09/03/2015  . Status post peripherally inserted central catheter (PICC) central line placement   . Abdominal pain   . Acute respiratory failure with hypoxia ([place]) 09/12/2015  . HCAP (healthcare-associated pneumonia) 09/12/2015  . C. difficile colitis 09/12/2015  . Acute on chronic diastolic CHF (congestive heart failure), NYHA  class 1 ([place]) 09/12/2015  . Severe sepsis with septic shock ([place]) 09/12/2015  . ATN (acute tubular necrosis) ([place]) 09/12/2015  . Oral thrush 09/14/2015  . Acute kidney injury ([place]) 12/02/2016   Past Medical History:  Diagnosis Date  . Atrial fibrillation and flutter ([place]) 2013  . Kidney disease, chronic, stage III (moderate, EGFR 30-59 ml/min)   . OSA (obstructive sleep apnea)    Constitutional Exam  General appearance: alert, cooperative, confused  and in mild distress Vitals:   01/02/20 1056  BP: (!) 147/81  Pulse: (!) 111  Resp: 18  Temp: 98 F (36.7 C)  TempSrc: Temporal  SpO2: 98%  Weight: 167 lb 6.4 oz (75.9 kg)  Height: 5' 10"  (1.778 m)   BMI Assessment: Estimated body mass index is 24.02 kg/m as calculated from the following:   Height as of this encounter: 5' 10"  (1.778 m).   Weight as of this encounter: 167 lb 6.4 oz (75.9 kg).  BMI interpretation table: BMI level Category Range association with higher incidence of chronic pain  <18 kg/m2 Underweight   18.5-24.9 kg/m2 Ideal body weight   25-29.9 kg/m2 Overweight Increased incidence by 20%  30-34.9 kg/m2 Obese (Class I) Increased incidence by 68%  35-39.9 kg/m2 Severe obesity (Class II) Increased incidence by 136%  >40 kg/m2 Extreme obesity (Class III) Increased incidence by 254%   Patient's current BMI Ideal Body weight  Body mass index is 24.02 kg/m. Ideal body weight: 73 kg (160 lb 15  oz) Adjusted ideal body weight: 74.2 kg (163 lb 8.3 oz)   BMI Readings from Last 4 Encounters:  01/02/20 24.02 kg/m  11/27/19 25.11 kg/m  10/25/19 25.11 kg/m  06/27/19 25.68 kg/m   Wt Readings from Last 4 Encounters:  01/02/20 167 lb 6.4 oz (75.9 kg)  11/27/19 175 lb (79.4 kg)  10/25/19 175 lb (79.4 kg)  06/27/19 179 lb (81.2 kg)    Psych/Mental status: Alert, oriented x 3 (person, place, & time)       Eyes: PERLA Respiratory: No evidence of acute respiratory distress   Thoracic Spine Area Exam  Skin & Axial  Inspection: No masses, redness, or swelling Alignment: Symmetrical Functional ROM: Pain restricted ROM Stability: No instability detected Muscle Tone/Strength: Functionally intact. No obvious neuro-muscular anomalies detected. Sensory (Neurological): Musculoskeletal pain pattern Muscle strength & Tone: No palpable anomalies  Lumbar Exam  Skin & Axial Inspection: No masses, redness, or swelling Alignment: Scoliosis detected Functional ROM: Pain restricted ROM affecting both sides Stability: No instability detected Muscle Tone/Strength: Functionally intact. No obvious neuro-muscular anomalies detected. Sensory (Neurological): Musculoskeletal pain pattern and dermatomal Palpation: Complains of area being tender to palpation       Provocative Tests: Hyperextension/rotation test: (+) bilaterally for facet joint pain. Lumbar quadrant test (Kemp's test): (+) bilaterally for facet joint pain.   Gait & Posture Assessment  Ambulation: Dependent. Level I (intermittent assistance required) Gait: Limited. Using assistive device to ambulate Posture: Difficulty standing up straight, due to pain   Lower Extremity Exam    Side: Right lower extremity  Side: Left lower extremity  Stability: No instability observed          Stability: No instability observed          Skin & Extremity Inspection: Skin color, temperature, and hair growth are WNL. No peripheral edema or cyanosis. No masses, redness, swelling, asymmetry, or associated skin lesions. No contractures.  Skin & Extremity Inspection: Skin color, temperature, and hair growth are WNL. No peripheral edema or cyanosis. No masses, redness, swelling, asymmetry, or associated skin lesions. No contractures.  Functional ROM: Pain restricted ROM for all joints of the lower extremity          Functional ROM: Pain restricted ROM for all joints of the lower extremity          Muscle Tone/Strength: Functionally intact. No obvious neuro-muscular anomalies  detected.  Muscle Tone/Strength: Functionally intact. No obvious neuro-muscular anomalies detected.  Sensory (Neurological): Dermatomal pain pattern        Sensory (Neurological): Dermatomal pain pattern        DTR: Patellar: deferred today Achilles: deferred today Plantar: deferred today  DTR: Patellar: deferred today Achilles: deferred today Plantar: deferred today  Palpation: No palpable anomalies  Palpation: No palpable anomalies   Assessment  Primary Diagnosis & Pertinent Problem List: The primary encounter diagnosis was Lumbar facet arthropathy. Diagnoses of Lumbar spondylosis, Compression fracture of L3 vertebra, sequela, Discogenic thoracic pain, Lumbar degenerative disc disease, Opioid type dependence, continuous ([place]), and Chronic pain syndrome were also pertinent to this visit.  Visit Diagnosis (New problems to examiner): 1. Lumbar facet arthropathy   2. Lumbar spondylosis   3. Compression fracture of L3 vertebra, sequela   4. Discogenic thoracic pain   5. Lumbar degenerative disc disease   6. Opioid type dependence, continuous ([place])   7. Chronic pain syndrome     Plan of Care (Initial workup plan)   Lumbar MRI shows diffuse lumbar facet arthropathy, lumbar spondylosis with associated anterolisthesis.  This is most present at L3, L4, L5.  Discussed lumbar facet medial branch nerve blocks with the patient and how these would be diagnostic nerve blocks and if effective could consider therapeutic lumbar radiofrequency ablation.  Patient is on Eliquis and would need cardiac clearance to discontinue.  Patient states that he is not interested at this time. Lumbar spondylosis Can consider diagnostic lumbar facet medial branch nerve blocks at L3, L4, L5.  Will need cardiac clearance to stop Eliquis prior to procedure.  Patient wants to hold off and will contact us if he would like to pursue this  Compression fracture of third lumbar vertebra [facility]) Offered patient referral to Dr.  Rudene Christians to consider kyphoplasty for L3 compression fracture however patient declined  Chronic pain syndrome Patient is a poor historian and is not clear regarding his medications that he has taken in the past.  I informed him that our clinic policy is 40 MME and that if he would like to be considered for chronic opioid therapy here we will need to complete a urine toxicology screen and afterwards we will likely wean him down to 10 mg 4 times daily as needed.  Patient states that he is not interested in weaning his current opioid regimen.  He states that he does get benefit at oxycodone 15 mg 4 times a day.  He states that he has been with Dr. Caryn Section for many years and was appreciative of the care that he has provided him for over 20 years.  At this point, I recommend patient continue medication management with Dr. Caryn Section.  If you would like to transition care to [place] [facility], patient will need baseline urine toxicology screen and he needs to be clear that we will not exceed a dose greater than 60 MME.   Pharmacotherapy (current): Medications ordered:  Meds ordered this encounter  Medications  . orphenadrine (NORFLEX) injection 30 mg   Medications administered during this visit: We administered orphenadrine.    Interventional management options: Mr. Teaster was informed that there is no guarantee that he would be a candidate for interventional therapies. The decision will be based on the results of diagnostic studies, as well as Mr. Torry risk profile.  Procedure(s) under consideration:  Diagnostic lumbar facet medial branch nerve blocks, will need cardiac clearance to stop Eliquis   Provider-requested follow-up: No follow-ups on file.  Future Appointments  Date Time Provider [place]  04/02/2020  2:00 PM Birdie Sons, MD [facility]  01/02/2021 11:00 AM [facility]-NURSE HEALTH ADVISOR [facility]    Note by: Gillis Santa, MD Date: 01/02/2020; Time: 1:29 PM

## 2020-01-02 NOTE — Telephone Encounter (Signed)
Pt wants Dr Caryn Section to know he went to the pain clinic today and they suggest he ask Dr Caryn Section to continue the  oxyCODONE (ROXICODONE) 15 MG immediate release tablet  Pt wants the dr to refill his regular Rx for 120 tabs of the oxycodone and send to   [place], [place] Phone:  [phone]  Fax:  [phone]

## 2020-01-02 NOTE — Telephone Encounter (Signed)
The gabapentin is to help with the pain in combination with the oxycodone. I think Dr. Holley Raring took it off his medication list because he didn't think the patient was taking it, but I think it would help.  A oxycodone prescription was sent to pharmacy earlier today.

## 2020-01-02 NOTE — Telephone Encounter (Signed)
Josh from [facility] states the patient has contacted them and does not understand why he was given Gabapentin.  Gabapentin is not currently on his med list here.  After reviewing historical meds it appears an Rx for 300 mg # 60 was sent in on 12/28/19 and ended on 01/02/20.  Is he supposed to be on this?

## 2020-01-02 NOTE — Assessment & Plan Note (Signed)
Patient is a poor historian and is not clear regarding his medications that he has taken in the past.  I informed him that our clinic policy is 63 MME and that if he would like to be considered for chronic opioid therapy here we will need to complete a urine toxicology screen and afterwards we will likely wean him down to 10 mg 4 times daily as needed.  Patient states that he is not interested in weaning his current opioid regimen.  He states that he does get benefit at oxycodone 15 mg 4 times a day.  He states that he has been with Dr. Caryn Section for many years and was appreciative of the care that he has provided him for over 20 years.  At this point, I recommend patient continue medication management with Dr. Caryn Section.  If you would like to transition care to [facility] pain clinic, patient will need baseline urine toxicology screen and he needs to be clear that we will not exceed a dose greater than 60 MME.

## 2020-01-02 NOTE — Assessment & Plan Note (Signed)
Offered patient referral to Dr. Rudene Christians to consider kyphoplasty for L3 compression fracture however patient declined

## 2020-01-04 ENCOUNTER — Other Ambulatory Visit: Payer: Self-pay | Admitting: Family Medicine

## 2020-01-08 ENCOUNTER — Telehealth: Payer: Self-pay | Admitting: Family Medicine

## 2020-01-08 NOTE — Telephone Encounter (Signed)
I do not give early refills of narcotics or other controlled substances, even if they are lost or stolen as we have no way of confirming this.  I do not want to increase risk of overdose.  Furthermore, the varying doses and prescriptions at varying intervals is concerning for medication misuse.  He can follow-up with PCP regarding this if he wishes.  Also recommend formal pain contract if he does not already have one (check media).

## 2020-01-08 NOTE — Telephone Encounter (Signed)
Pt called back again. Pt states he is in constant pain. Pt would like the medication sent in asap.

## 2020-01-08 NOTE — Telephone Encounter (Signed)
Patient was advised.  He insists that he only takes the medicine when in pain and that the pain clinic said he is to get his pain medicine from Dr. Caryn Section.  After a long serman from the patient on how he is taking the medicine, how he needs it for his pain, how the pain clinic said it will be 4 weeks before they can get him set up for surgery and how he was told by them to have Dr Caryn Section take care of giving him the pain meds the patient is still insistent he get something.  I have advised him he will need to go to the ER if in that much pain, talk to the pain clinic or discuss the situation with Dr Caryn Section when he gets back.

## 2020-01-08 NOTE — Telephone Encounter (Signed)
Please review this and last several messages for this patient as he has had several issues and reasons for getting early or new refills.

## 2020-01-08 NOTE — Telephone Encounter (Signed)
Patient called again and states that he has to have his medicine sent in.  He is in a lot of pain.  He requested to speak with the office manager to make her get the medicine refilled.  Explained to the patient that Dr Caryn Section was not here.  The office manager could not refill his medicine and that I could not make any of the providers refill his medication.  I would relay his concern to the provider but could not guarantee that it would be called in given how much he has been getting recently.  He states he does not take them for anything other than for his pain and he takes them exactly the way he is supposed to.  He was very upset that this has not been done and he said he can not understand why we don't just do what he says so he won't be in pain.

## 2020-01-08 NOTE — Telephone Encounter (Signed)
Pt states he has lost his oxyCODONE (ROXICODONE) 15 MG immediate release tablet  Pt states he needs another Rx sent to the pharmacy right now..  Not 5 pm like dr normally does.  Pt states he has nothing to take.  Pt states the pain dr told him he does not prescribe pain medicine.  Pt states it may be 4 weeks until he can get his back surgery, and he will need Dr Caryn Section to prescribe pain meds until his surgery.  [place], [place] Phone:  [phone]  Fax:  [phone]

## 2020-01-08 NOTE — Telephone Encounter (Signed)
Pt calling again and that he lost his pills and he is needing these pills. Please advise.

## 2020-01-10 ENCOUNTER — Telehealth: Payer: Self-pay

## 2020-01-10 NOTE — Telephone Encounter (Signed)
Copied from [place] [id number]. Topic: General - Other >> Jan 10, 2020 12:53 PM [name] A wrote: Reason for CRM: Patient called to speak to Dr Caryn Section about all the problems that he is having with the pain in his back. Per patient the other providers does not understand so he definitely need to speak with Dr Caryn Section. Please advise Ph# [phone]

## 2020-01-10 NOTE — Telephone Encounter (Signed)
Spoke with patient and advised him that Dr. Caryn Section is out of office but I could relay message to another provider. Patient states that he is aware that Dr. Caryn Section will not be back in office until Monday and states he would like to speak with him personal about problems with his back. Patient states that he believes misinformation has been given and wants clarification. Patient requesting call back from docotr when he returns 01/15/20. KW

## 2020-01-11 ENCOUNTER — Other Ambulatory Visit: Payer: Self-pay | Admitting: Family Medicine

## 2020-01-11 DIAGNOSIS — M5136 Other intervertebral disc degeneration, lumbar region: Secondary | ICD-10-CM

## 2020-01-11 MED ORDER — OXYCODONE HCL 15 MG PO TABS: 15.0000 mg | ORAL_TABLET | ORAL | 0 refills | Status: DC | PRN

## 2020-01-15 ENCOUNTER — Telehealth: Payer: Self-pay

## 2020-01-15 ENCOUNTER — Telehealth: Payer: Self-pay | Admitting: Family Medicine

## 2020-01-15 DIAGNOSIS — G8929 Other chronic pain: Secondary | ICD-10-CM

## 2020-01-15 DIAGNOSIS — S32000A Wedge compression fracture of unspecified lumbar vertebra, initial encounter for closed fracture: Secondary | ICD-10-CM

## 2020-01-15 NOTE — Telephone Encounter (Signed)
Patient is calling to ask why his medication was changed to 5mg  instead of 15mg .  oxyCODONE (ROXICODONE) 15 MG immediate release tablet [[id number]] - Please advise Cb- [phone]

## 2020-01-15 NOTE — Telephone Encounter (Signed)
Refill was sent on Thursday. It should be at pharmacy.

## 2020-01-15 NOTE — Telephone Encounter (Signed)
Copied from CRM [id number]. Topic: General - Other >> Jan 15, 2020  9:10 AM Tamela Oddi wrote: Reason for CRM: Patient is calling to ask for pain medication for his back.  Stated he has been without the meds for two weeks and is in terrible pain.  Please advise.  CB# [phone]

## 2020-01-15 NOTE — Telephone Encounter (Signed)
Copied from [place] [id number]. Topic: General - Other >> Jan 15, 2020  3:31 PM [name] A wrote: Reason for CRM: Patient called to inform Dr Caryn Section that he is trying to get in touch with Dr Holley Raring to no avail, and need to know when to expect a surgery date for his back because of the severe pain he is experiencing. Please call patient at Ph# [phone]

## 2020-01-15 NOTE — Telephone Encounter (Signed)
Patient is calling to ask why his medication was changed to 5mg  instead of 15mg .  oxyCODONE (ROXICODONE) 15 MG immediate release tablet [[id number]] - Please advise Cb- [phone]

## 2020-01-16 NOTE — Telephone Encounter (Signed)
It wasn't, I sent in prescription for 15mg  tablets on August 19th. He was prescribed 5mg  percocet in July to take between doses of oxycodone, but not since.

## 2020-01-16 NOTE — Telephone Encounter (Signed)
Please review. Thanks!  

## 2020-01-17 NOTE — Telephone Encounter (Signed)
I spoke with the patient and he said they got the medication straight.   He then asked if you and Dr Holley Raring had gotten something worked out for him to have surgery. I explained that we nor Dr Holley Raring do surgery or schedule surgery.  After some discussion and relaying to him that Dr Elwyn Lade note indicated he did not want to proceed with Kyphoplasty the patient said that the doctor was mistaken and that he did want to have the surgery.  I told him I would let you know that he would like to have the referral to ortho to be evaluated for procedure.

## 2020-01-19 ENCOUNTER — Other Ambulatory Visit: Payer: Self-pay | Admitting: Family Medicine

## 2020-01-19 DIAGNOSIS — M5136 Other intervertebral disc degeneration, lumbar region: Secondary | ICD-10-CM

## 2020-01-19 MED ORDER — OXYCODONE HCL 15 MG PO TABS: 15.0000 mg | ORAL_TABLET | ORAL | 0 refills | Status: DC | PRN

## 2020-01-22 NOTE — Telephone Encounter (Signed)
Order placed for referral to Dr. Rudene Christians

## 2020-01-24 ENCOUNTER — Other Ambulatory Visit: Payer: Self-pay | Admitting: Family Medicine

## 2020-01-24 DIAGNOSIS — E790 Hyperuricemia without signs of inflammatory arthritis and tophaceous disease: Secondary | ICD-10-CM

## 2020-01-24 NOTE — Telephone Encounter (Signed)
Referral was previously placed for orthopedics referral for kyphoplasty

## 2020-01-25 ENCOUNTER — Other Ambulatory Visit: Payer: Self-pay | Admitting: Orthopedic Surgery

## 2020-01-25 ENCOUNTER — Ambulatory Visit
Admission: RE | Admit: 2020-01-25 | Discharge: 2020-01-25 | Disposition: A | Discharge status: Home / Self Care | Payer: Medicare Other | Source: Ambulatory Visit | Attending: Orthopedic Surgery | Admitting: Orthopedic Surgery

## 2020-01-25 ENCOUNTER — Other Ambulatory Visit: Payer: Self-pay

## 2020-01-25 DIAGNOSIS — S22080A Wedge compression fracture of T11-T12 vertebra, initial encounter for closed fracture: Secondary | ICD-10-CM | POA: Diagnosis not present

## 2020-01-25 DIAGNOSIS — S32030A Wedge compression fracture of third lumbar vertebra, initial encounter for closed fracture: Secondary | ICD-10-CM | POA: Insufficient documentation

## 2020-01-25 DIAGNOSIS — M545 Low back pain: Secondary | ICD-10-CM | POA: Diagnosis not present

## 2020-01-25 DIAGNOSIS — M4316 Spondylolisthesis, lumbar region: Secondary | ICD-10-CM | POA: Diagnosis not present

## 2020-01-25 DIAGNOSIS — M47814 Spondylosis without myelopathy or radiculopathy, thoracic region: Secondary | ICD-10-CM | POA: Diagnosis not present

## 2020-01-25 DIAGNOSIS — M47816 Spondylosis without myelopathy or radiculopathy, lumbar region: Secondary | ICD-10-CM | POA: Diagnosis not present

## 2020-01-25 DIAGNOSIS — M5124 Other intervertebral disc displacement, thoracic region: Secondary | ICD-10-CM | POA: Diagnosis not present

## 2020-01-25 DIAGNOSIS — M4804 Spinal stenosis, thoracic region: Secondary | ICD-10-CM | POA: Diagnosis not present

## 2020-01-26 ENCOUNTER — Other Ambulatory Visit: Payer: Self-pay | Admitting: Family Medicine

## 2020-01-26 DIAGNOSIS — M5136 Other intervertebral disc degeneration, lumbar region: Secondary | ICD-10-CM

## 2020-01-26 MED ORDER — OXYCODONE HCL 15 MG PO TABS: 15.0000 mg | ORAL_TABLET | ORAL | 0 refills | Status: DC | PRN

## 2020-01-30 ENCOUNTER — Other Ambulatory Visit: Payer: Self-pay | Admitting: Orthopedic Surgery

## 2020-02-02 ENCOUNTER — Other Ambulatory Visit: Payer: Self-pay

## 2020-02-02 ENCOUNTER — Other Ambulatory Visit: Payer: Self-pay | Admitting: Family Medicine

## 2020-02-02 ENCOUNTER — Other Ambulatory Visit: Payer: Medicare Other

## 2020-02-02 ENCOUNTER — Other Ambulatory Visit
Admission: RE | Admit: 2020-02-02 | Discharge: 2020-02-02 | Disposition: A | Discharge status: Home / Self Care | Payer: Medicare Other | Source: Ambulatory Visit | Attending: Orthopedic Surgery | Admitting: Orthopedic Surgery

## 2020-02-02 DIAGNOSIS — M5136 Other intervertebral disc degeneration, lumbar region: Secondary | ICD-10-CM

## 2020-02-02 MED ORDER — OXYCODONE HCL 15 MG PO TABS: 15.0000 mg | ORAL_TABLET | ORAL | 0 refills | Status: DC | PRN

## 2020-02-02 NOTE — Patient Instructions (Signed)
Your procedure is scheduled on: Tuesday February 06, 2020. Report to Day Surgery inside [facility] 2nd floor. To find out your arrival time please call [phone] between 1PM - 3PM on Monday February 05, 2020.  Remember: Instructions that are not followed completely may result in serious medical risk,  up to and including death, or upon the discretion of your surgeon and anesthesiologist your  surgery may need to be rescheduled.     _X__ 1. Do not eat food after midnight the night before your procedure.                 No chewing gum or hard candies. You may drink clear liquids up to 2 hours                 before you are scheduled to arrive for your surgery- DO not drink clear                 liquids within 2 hours of the start of your surgery.                 Clear Liquids include:  water, apple juice without pulp, clear Gatorade, G2 or                  Gatorade Zero (avoid Red/Purple/Blue), Black Coffee or Tea (Do not add                 anything to coffee or tea).  __X__2.   Complete the "Ensure Clear Pre-surgery Clear Carbohydrate Drink" provided to you, 2 hours before arrival.   __X__3.  On the morning of surgery brush your teeth with toothpaste and water, you                may rinse your mouth with mouthwash if you wish.  Do not swallow any toothpaste of mouthwash.     _X__ 4.  No Alcohol for 24 hours before or after surgery.   _X__ 5.  Do Not Smoke or use e-cigarettes For 24 Hours Prior to Your Surgery.                 Do not use any chewable tobacco products for at least 6 hours prior to                 Surgery.  _X__  6.  Do not use any recreational drugs (marijuana, cocaine, heroin, ecstasy, MDMA or other)                For at least one week prior to your surgery.  Combination of these drugs with anesthesia                May have life threatening results.    __X__  7.  Notify your doctor if there is any change in your medical condition       (cold, fever, infections).     Do not wear jewelry, make-up, hairpins, clips or nail polish. Do not wear lotions, powders, or perfumes. You may wear deodorant. Do not shave 48 hours prior to surgery. Men may shave face and neck. Do not bring valuables to the hospital.    [facility] is not responsible for any belongings or valuables.  Contacts, dentures or bridgework may not be worn into surgery. Leave your suitcase in the car. After surgery it may be brought to your room. For patients admitted to the hospital, discharge time is determined by your treatment team.  Patients discharged the day of surgery will not be allowed to drive home.   Make arrangements for someone to be with you for the first 24 hours of your Same Day Discharge.   __X__ Take these medicines the morning of surgery with A SIP OF WATER:    1. allopurinol (ZYLOPRIM) 100 MG  2. gabapentin (NEURONTIN) 300 MG  3. metoprolol succinate (TOPROL-XL) 25 MG  4. oxyCODONE (ROXICODONE) 15 MG   __X__ Use CHG wipes as directed  __X__ Stop apixaban (ELIQUIS) 5 days before your procedure as instructed by your doctor.   __X__ Stop Anti-inflammatories such as Ibuprofen, Aleve, Advil, naproxen, aspiring and or BC powders.    __X__ Stop supplements until after surgery.    __X__ Do not start any herbal supplements before your procedure.    If you have any questions regarding your pre-procedure instructions,  Please call Pre-admit Testing at [phone].

## 2020-02-05 ENCOUNTER — Other Ambulatory Visit
Admission: RE | Admit: 2020-02-05 | Discharge: 2020-02-05 | Disposition: A | Discharge status: Home / Self Care | Payer: Medicare Other | Source: Ambulatory Visit | Attending: Orthopedic Surgery | Admitting: Orthopedic Surgery

## 2020-02-05 ENCOUNTER — Other Ambulatory Visit: Payer: Self-pay

## 2020-02-05 DIAGNOSIS — Z20822 Contact with and (suspected) exposure to covid-19: Secondary | ICD-10-CM | POA: Insufficient documentation

## 2020-02-05 DIAGNOSIS — Z01812 Encounter for preprocedural laboratory examination: Secondary | ICD-10-CM | POA: Insufficient documentation

## 2020-02-05 DIAGNOSIS — M5136 Other intervertebral disc degeneration, lumbar region: Secondary | ICD-10-CM

## 2020-02-05 NOTE — Telephone Encounter (Signed)
Copied from [place] [phone]. Topic: General - Inquiry >> Feb 05, 2020 10:05 AM Greggory Keen D wrote: Reason for CRM: Pt called saying he had an appt at [facility]  today and when he was there he dropped his medicine bottle that had the oxycodone in it.  He found the bottle in the parking lot beside his car but it was empty.  He will need a refill asap  [place]  CB#  [phone]

## 2020-02-06 ENCOUNTER — Ambulatory Visit: Payer: Medicare Other

## 2020-02-06 ENCOUNTER — Ambulatory Visit: Payer: Medicare Other | Admitting: Anesthesiology

## 2020-02-06 ENCOUNTER — Ambulatory Visit
Admission: RE | Admit: 2020-02-06 | Discharge: 2020-02-06 | Disposition: A | Discharge status: Home / Self Care | Payer: Medicare Other | Attending: Orthopedic Surgery | Admitting: Orthopedic Surgery

## 2020-02-06 ENCOUNTER — Encounter: Payer: Self-pay | Admitting: Orthopedic Surgery

## 2020-02-06 ENCOUNTER — Encounter
Admission: RE | Disposition: A | Discharge status: Home / Self Care | Payer: Self-pay | Source: Home / Self Care | Attending: Orthopedic Surgery

## 2020-02-06 DIAGNOSIS — N189 Chronic kidney disease, unspecified: Secondary | ICD-10-CM | POA: Insufficient documentation

## 2020-02-06 DIAGNOSIS — N183 Chronic kidney disease, stage 3 unspecified: Secondary | ICD-10-CM | POA: Diagnosis not present

## 2020-02-06 DIAGNOSIS — Z7982 Long term (current) use of aspirin: Secondary | ICD-10-CM | POA: Diagnosis not present

## 2020-02-06 DIAGNOSIS — S22080A Wedge compression fracture of T11-T12 vertebra, initial encounter for closed fracture: Secondary | ICD-10-CM | POA: Diagnosis not present

## 2020-02-06 DIAGNOSIS — Z79891 Long term (current) use of opiate analgesic: Secondary | ICD-10-CM | POA: Insufficient documentation

## 2020-02-06 DIAGNOSIS — I129 Hypertensive chronic kidney disease with stage 1 through stage 4 chronic kidney disease, or unspecified chronic kidney disease: Secondary | ICD-10-CM | POA: Diagnosis not present

## 2020-02-06 DIAGNOSIS — S32030A Wedge compression fracture of third lumbar vertebra, initial encounter for closed fracture: Secondary | ICD-10-CM | POA: Insufficient documentation

## 2020-02-06 DIAGNOSIS — I2510 Atherosclerotic heart disease of native coronary artery without angina pectoris: Secondary | ICD-10-CM | POA: Insufficient documentation

## 2020-02-06 DIAGNOSIS — W11XXXA Fall on and from ladder, initial encounter: Secondary | ICD-10-CM | POA: Insufficient documentation

## 2020-02-06 DIAGNOSIS — E785 Hyperlipidemia, unspecified: Secondary | ICD-10-CM | POA: Insufficient documentation

## 2020-02-06 DIAGNOSIS — Z981 Arthrodesis status: Secondary | ICD-10-CM | POA: Diagnosis not present

## 2020-02-06 DIAGNOSIS — I4891 Unspecified atrial fibrillation: Secondary | ICD-10-CM | POA: Diagnosis not present

## 2020-02-06 DIAGNOSIS — E1122 Type 2 diabetes mellitus with diabetic chronic kidney disease: Secondary | ICD-10-CM | POA: Diagnosis not present

## 2020-02-06 DIAGNOSIS — Z7901 Long term (current) use of anticoagulants: Secondary | ICD-10-CM | POA: Diagnosis not present

## 2020-02-06 DIAGNOSIS — Z79899 Other long term (current) drug therapy: Secondary | ICD-10-CM | POA: Insufficient documentation

## 2020-02-06 DIAGNOSIS — Z419 Encounter for procedure for purposes other than remedying health state, unspecified: Secondary | ICD-10-CM

## 2020-02-06 DIAGNOSIS — G4730 Sleep apnea, unspecified: Secondary | ICD-10-CM | POA: Insufficient documentation

## 2020-02-06 DIAGNOSIS — M4726 Other spondylosis with radiculopathy, lumbar region: Secondary | ICD-10-CM | POA: Diagnosis not present

## 2020-02-06 DIAGNOSIS — M4855XA Collapsed vertebra, not elsewhere classified, thoracolumbar region, initial encounter for fracture: Secondary | ICD-10-CM | POA: Diagnosis not present

## 2020-02-06 DIAGNOSIS — Z951 Presence of aortocoronary bypass graft: Secondary | ICD-10-CM | POA: Insufficient documentation

## 2020-02-06 DIAGNOSIS — M4316 Spondylolisthesis, lumbar region: Secondary | ICD-10-CM | POA: Insufficient documentation

## 2020-02-06 DIAGNOSIS — J449 Chronic obstructive pulmonary disease, unspecified: Secondary | ICD-10-CM | POA: Diagnosis not present

## 2020-02-06 LAB — SARS CORONAVIRUS 2 (TAT 6-24 HRS): SARS Coronavirus 2: NEGATIVE

## 2020-02-06 SURGERY — KYPHOPLASTY
Anesthesia: Monitor Anesthesia Care | Site: Back

## 2020-02-06 MED ORDER — BUPIVACAINE-EPINEPHRINE (PF) 0.5% -1:200000 IJ SOLN
INTRAMUSCULAR | Status: AC
  Filled 2020-02-06: qty 30

## 2020-02-06 MED ORDER — ONDANSETRON HCL 4 MG/2ML IJ SOLN: 4.0000 mg | Freq: Once | INTRAMUSCULAR | Status: DC | PRN

## 2020-02-06 MED ORDER — FENTANYL CITRATE (PF) 100 MCG/2ML IJ SOLN
INTRAMUSCULAR | Status: AC
  Filled 2020-02-06: qty 2

## 2020-02-06 MED ORDER — SODIUM CHLORIDE 0.9 % IV SOLN: INTRAVENOUS | Status: DC

## 2020-02-06 MED ORDER — PROPOFOL 10 MG/ML IV BOLUS
INTRAVENOUS | Status: DC | PRN
  Administered 2020-02-06: 20 mg via INTRAVENOUS

## 2020-02-06 MED ORDER — METOCLOPRAMIDE HCL 10 MG PO TABS: 5.0000 mg | ORAL_TABLET | Freq: Three times a day (TID) | ORAL | Status: DC | PRN

## 2020-02-06 MED ORDER — PROPOFOL 10 MG/ML IV BOLUS
INTRAVENOUS | Status: AC
  Filled 2020-02-06: qty 20

## 2020-02-06 MED ORDER — ONDANSETRON HCL 4 MG PO TABS: 4.0000 mg | ORAL_TABLET | Freq: Four times a day (QID) | ORAL | Status: DC | PRN

## 2020-02-06 MED ORDER — KETAMINE HCL 10 MG/ML IJ SOLN
INTRAMUSCULAR | Status: DC | PRN
  Administered 2020-02-06 (×3): 10 mg via INTRAVENOUS

## 2020-02-06 MED ORDER — FENTANYL CITRATE (PF) 100 MCG/2ML IJ SOLN
25.0000 ug | INTRAMUSCULAR | Status: DC | PRN
  Administered 2020-02-06: 25 ug via INTRAVENOUS

## 2020-02-06 MED ORDER — OXYCODONE-ACETAMINOPHEN 7.5-325 MG PO TABS: 1.0000 | ORAL_TABLET | ORAL | 0 refills | Status: DC | PRN

## 2020-02-06 MED ORDER — OXYCODONE-ACETAMINOPHEN 7.5-325 MG PO TABS
1.0000 | ORAL_TABLET | Freq: Once | ORAL | Status: AC
Start: 2020-02-06 — End: 2020-02-06

## 2020-02-06 MED ORDER — CEFAZOLIN SODIUM-DEXTROSE 2-4 GM/100ML-% IV SOLN
2.0000 g | INTRAVENOUS | Status: AC
  Administered 2020-02-06: 2 g via INTRAVENOUS

## 2020-02-06 MED ORDER — LACTATED RINGERS IV SOLN: INTRAVENOUS | Status: DC

## 2020-02-06 MED ORDER — OXYCODONE HCL 15 MG PO TABS: 15.0000 mg | ORAL_TABLET | ORAL | 0 refills | Status: DC | PRN

## 2020-02-06 MED ORDER — LIDOCAINE HCL 1 % IJ SOLN
INTRAMUSCULAR | Status: DC | PRN
  Administered 2020-02-06: 40 mL

## 2020-02-06 MED ORDER — ORAL CARE MOUTH RINSE: 15.0000 mL | Freq: Once | OROMUCOSAL | Status: AC

## 2020-02-06 MED ORDER — OXYCODONE-ACETAMINOPHEN 7.5-325 MG PO TABS
ORAL_TABLET | ORAL | Status: AC
  Administered 2020-02-06: 1 via ORAL
  Filled 2020-02-06: qty 1

## 2020-02-06 MED ORDER — LIDOCAINE HCL (CARDIAC) PF 100 MG/5ML IV SOSY
PREFILLED_SYRINGE | INTRAVENOUS | Status: DC | PRN
  Administered 2020-02-06: 40 mg via INTRAVENOUS

## 2020-02-06 MED ORDER — DEXMEDETOMIDINE HCL IN NACL 400 MCG/100ML IV SOLN
INTRAVENOUS | Status: DC | PRN
  Administered 2020-02-06 (×3): 4 ug via INTRAVENOUS

## 2020-02-06 MED ORDER — DEXMEDETOMIDINE (PRECEDEX) IN NS 20 MCG/5ML (4 MCG/ML) IV SYRINGE
PREFILLED_SYRINGE | INTRAVENOUS | Status: AC
  Filled 2020-02-06: qty 5

## 2020-02-06 MED ORDER — METOCLOPRAMIDE HCL 5 MG/ML IJ SOLN: 5.0000 mg | Freq: Three times a day (TID) | INTRAMUSCULAR | Status: DC | PRN

## 2020-02-06 MED ORDER — FENTANYL CITRATE (PF) 100 MCG/2ML IJ SOLN
INTRAMUSCULAR | Status: DC | PRN
  Administered 2020-02-06 (×3): 25 ug via INTRAVENOUS

## 2020-02-06 MED ORDER — CHLORHEXIDINE GLUCONATE 0.12 % MT SOLN
OROMUCOSAL | Status: AC
  Administered 2020-02-06: 15 mL via OROMUCOSAL
  Filled 2020-02-06: qty 15

## 2020-02-06 MED ORDER — KETAMINE HCL 50 MG/ML IJ SOLN
INTRAMUSCULAR | Status: AC
  Filled 2020-02-06: qty 10

## 2020-02-06 MED ORDER — LIDOCAINE HCL (PF) 1 % IJ SOLN
INTRAMUSCULAR | Status: AC
  Filled 2020-02-06: qty 60

## 2020-02-06 MED ORDER — FAMOTIDINE 20 MG PO TABS
ORAL_TABLET | ORAL | Status: AC
  Administered 2020-02-06: 20 mg via ORAL
  Filled 2020-02-06: qty 1

## 2020-02-06 MED ORDER — FENTANYL CITRATE (PF) 100 MCG/2ML IJ SOLN
INTRAMUSCULAR | Status: AC
  Administered 2020-02-06: 25 ug via INTRAVENOUS
  Filled 2020-02-06: qty 2

## 2020-02-06 MED ORDER — PROPOFOL 500 MG/50ML IV EMUL
INTRAVENOUS | Status: DC | PRN
  Administered 2020-02-06: 30 ug/kg/min via INTRAVENOUS

## 2020-02-06 MED ORDER — CEFAZOLIN SODIUM-DEXTROSE 2-4 GM/100ML-% IV SOLN
INTRAVENOUS | Status: AC
  Filled 2020-02-06: qty 100

## 2020-02-06 MED ORDER — ONDANSETRON HCL 4 MG/2ML IJ SOLN: 4.0000 mg | Freq: Four times a day (QID) | INTRAMUSCULAR | Status: DC | PRN

## 2020-02-06 MED ORDER — IOHEXOL 180 MG/ML  SOLN
INTRAMUSCULAR | Status: DC | PRN
  Administered 2020-02-06: 30 mL

## 2020-02-06 MED ORDER — FAMOTIDINE 20 MG PO TABS: 20.0000 mg | ORAL_TABLET | Freq: Once | ORAL | Status: AC

## 2020-02-06 MED ORDER — CHLORHEXIDINE GLUCONATE 0.12 % MT SOLN: 15.0000 mL | Freq: Once | OROMUCOSAL | Status: AC

## 2020-02-06 SURGICAL SUPPLY — 25 items
ADH SKN CLS APL DERMABOND .7 (GAUZE/BANDAGES/DRESSINGS) ×1
CEMENT KYPHON CX01A KIT/MIXER (Cement) ×2 IMPLANT
COVER WAND RF STERILE (DRAPES) ×2 IMPLANT
DERMABOND ADVANCED (GAUZE/BANDAGES/DRESSINGS) ×1
DERMABOND ADVANCED .7 DNX12 (GAUZE/BANDAGES/DRESSINGS) ×1 IMPLANT
DEVICE BIOPSY BONE KYPHX (INSTRUMENTS) ×2 IMPLANT
DRAPE C-ARM XRAY 36X54 (DRAPES) ×2 IMPLANT
DURAPREP 26ML APPLICATOR (WOUND CARE) ×2 IMPLANT
FEE RENTAL RFA GENERATOR (MISCELLANEOUS) IMPLANT
GLOVE SURG SYN 9.0  PF PI (GLOVE) ×2
GLOVE SURG SYN 9.0 PF PI (GLOVE) ×1 IMPLANT
GOWN SRG 2XL LVL 4 RGLN SLV (GOWNS) ×1 IMPLANT
GOWN STRL NON-REIN 2XL LVL4 (GOWNS) ×2
GOWN STRL REUS W/ TWL LRG LVL3 (GOWN DISPOSABLE) ×1 IMPLANT
GOWN STRL REUS W/TWL LRG LVL3 (GOWN DISPOSABLE) ×2
KYPHON bone cement mixer pack ×1 IMPLANT
KYPHON cement delivery system ×1 IMPLANT
PACK KYPHOPLASTY (MISCELLANEOUS) ×2 IMPLANT
RENTAL RFA GENERATOR (MISCELLANEOUS) IMPLANT
STRAP SAFETY 5IN WIDE (MISCELLANEOUS) ×2 IMPLANT
SWABSTK COMLB BENZOIN TINCTURE (MISCELLANEOUS) ×2 IMPLANT
SYS CARTRIDGE BONE CEMENT 8ML (SYSTAGENIX WOUND MANAGEMENT) ×2
SYSTEM CARTRIDG BONE CEMNT 8ML (SYSTAGENIX WOUND MANAGEMENT) IMPLANT
TRAY KYPHOPAK 15/3 EXPRESS 1ST (MISCELLANEOUS) ×2 IMPLANT
TRAY KYPHOPAK 20/3 EXPRESS 1ST (MISCELLANEOUS) ×2 IMPLANT

## 2020-02-06 NOTE — Anesthesia Preprocedure Evaluation (Signed)
Anesthesia Evaluation  Patient identified by MRN, date of birth, ID band Patient awake    Reviewed: Allergy & Precautions, H&P , NPO status , Patient's Chart, lab work & pertinent test results, reviewed documented beta blocker date and time   Airway Mallampati: II  TM Distance: >3 FB Neck ROM: full    Dental no notable dental hx. (+) Teeth Intact   Pulmonary sleep apnea , pneumonia, COPD,  COPD inhaler, former smoker,    Pulmonary exam normal breath sounds clear to auscultation       Cardiovascular Exercise Tolerance: Poor hypertension, On Medications + CAD  Atrial Fibrillation  Rhythm:regular Rate:Normal     Neuro/Psych Seizures -,  negative psych ROS   GI/Hepatic negative GI ROS, Neg liver ROS,   Endo/Other  negative endocrine ROSdiabetes, Well Controlled  Renal/GU Renal disease     Musculoskeletal   Abdominal   Peds  Hematology  (+) Blood dyscrasia, anemia ,   Anesthesia Other Findings   Reproductive/Obstetrics negative OB ROS                             Anesthesia Physical Anesthesia Plan  ASA: III  Anesthesia Plan: MAC   Post-op Pain Management:    Induction:   PONV Risk Score and Plan: 2  Airway Management Planned:   Additional Equipment:   Intra-op Plan:   Post-operative Plan:   Informed Consent: I have reviewed the patients History and Physical, chart, labs and discussed the procedure including the risks, benefits and alternatives for the proposed anesthesia with the patient or authorized representative who has indicated his/her understanding and acceptance.       Plan Discussed with: CRNA  Anesthesia Plan Comments:         Anesthesia Quick Evaluation

## 2020-02-06 NOTE — Anesthesia Procedure Notes (Signed)
Procedure Name: MAC Date/Time: 02/06/2020 10:25 AM Performed by: Jerrye Noble, CRNA Oxygen Delivery Method: Nasal cannula

## 2020-02-06 NOTE — Transfer of Care (Signed)
Immediate Anesthesia Transfer of Care Note  Patient: Vincent Mason  Procedure(s) Performed: T12 & L3 Kyphoplasty (N/A Back)  Patient Location: PACU  Anesthesia Type:MAC  Level of Consciousness: awake, drowsy and patient cooperative  Airway & Oxygen Therapy: Patient Spontanous Breathing  Post-op Assessment: Report given to RN and VSS  Post vital signs: Reviewed and stable  Last Vitals:  Vitals Value Taken Time  BP    Temp    Pulse    Resp    SpO2      Last Pain:  Vitals:   02/06/20 0845  TempSrc: Temporal  PainSc: 10-Worst pain ever         Complications: No complications documented.

## 2020-02-06 NOTE — Anesthesia Postprocedure Evaluation (Signed)
Anesthesia Post Note  Patient: Vincent Mason  Procedure(s) Performed: T12 & L3 Kyphoplasty (N/A Back)  Patient location during evaluation: PACU Anesthesia Type: MAC Level of consciousness: awake and alert Pain management: pain level controlled Vital Signs Assessment: post-procedure vital signs reviewed and stable Respiratory status: spontaneous breathing, nonlabored ventilation, respiratory function stable and patient connected to nasal cannula oxygen Cardiovascular status: blood pressure returned to baseline and stable Postop Assessment: no apparent nausea or vomiting Anesthetic complications: no   No complications documented.   Last Vitals:  Vitals:   02/06/20 1206 02/06/20 1216  BP:  (!) 145/62  Pulse: 97 (!) 107  Resp: (!) 22 18  Temp:  36.5 C  SpO2: 96% 99%    Last Pain:  Vitals:   02/06/20 1216  TempSrc: Temporal  PainSc: [place]is[place] Jamilya Sarrazin

## 2020-02-06 NOTE — H&P (Signed)
Chief Complaint  Patient presents with  . Spine - Pain   Vincent Mason is a 84 y.o. male who presents today for evaluation of acute midline back pain. He describes thoracolumbar and lower lumbar back pain that is been present for 2 months after a fall backwards off of a stepladder. Over the last 2 months he has been treated by pain clinic, taken oxycodone 10 to 20 mg twice daily as needed for pain. His pain has not improved. Prior to his fall he was ambulatory with no assistive devices, now he is having to use a walker over the last 2 months. He also feels some pain in both legs radiating down his legs but denies any numbness tingling or weakness. Has occasional mild groin pain but is severe and debilitating pain is coming along the midline of the thoracolumbar spine and along the mid lumbar spine. Patient states he has a history of old compression fracture, recent x-rays from the emergency department showed 2 compression fractures at T12 and L3.  Past Medical History: Past Medical History:  Diagnosis Date  . Atrial fibrillation (CMS-HCC)  . Blockage of coronary artery of heart (CMS-HCC)  . Cervical disc herniation  . Chronic kidney disease  . Coronary artery disease  . Edema of right lower extremity  secondary to saphenous vein graft.  . H/O cardiac catheterization  . Hyperlipidemia  . Hypertension  . Sleep apnea  . Tachycardia   Past Surgical History: Past Surgical History:  Procedure Laterality Date  . Cardiac cath  . Cervical disc surgery 2009  . COLONOSCOPY 03/29/2002, 08/15/2012  . CORONARY ARTERY BYPASS GRAFT   Past Family History: Family History  Problem Relation Age of Onset  . Myocardial Infarction (Heart attack) Mother  . High blood pressure (Hypertension) Mother  . Myocardial Infarction (Heart attack) Father  . High blood pressure (Hypertension) Father  . High blood pressure (Hypertension) Other  sibling   Medications: Current Outpatient Medications Ordered in  Epic  Medication Sig Dispense Refill  . hydrocortisone (PROCTOZONE-HC) 2.5 % rectal cream PLACE 1 APPLICATION RECTALLY TWICE DAILYAS DIRECTED  . oxyCODONE (ROXICODONE) 15 MG immediate release tablet Take by mouth  . allopurinoL (ZYLOPRIM) 100 MG tablet Take 100 mg by mouth 2 (two) times daily  . aspirin 81 MG EC tablet Take 81 mg by mouth once daily  . cholecalciferol (VITAMIN D3) 1000 unit capsule Take by mouth  . digoxin (LANOXIN) 0.125 MG tablet TAKE 1 TABLET BY MOUTH ONCE DAILY 90 tablet 1  . divalproex (DEPAKOTE) 250 MG DR tablet Take 1 tablet (250 mg total) by mouth 2 (two) times daily Patient needs refill for further refills. 60 tablet 0  . ELIQUIS 5 mg tablet TAKE 1 TABLET BY MOUTH TWICE A DAY 180 tablet 1  . ferrous sulfate 325 (65 FE) MG tablet Take 325 mg by mouth daily with breakfast  . hydrocortisone 1 % cream Apply topically  . levETIRAcetam (KEPPRA) 500 MG tablet TAKE 1 TABLET BY MOUTH TWICE A DAY 180 tablet 1  . metoprolol succinate (TOPROL-XL) 25 MG XL tablet Take 12.5 mg by mouth once daily  . multivit-min/folic/vit K/lycop (MEN'S 50 PLUS MULTIVITAMIN ORAL) Take 1 tablet by mouth once daily  . polyethylene glycol (MIRALAX) packet Take 17 g by mouth once daily Mix in 4-8ounces of fluid prior to taking.  . rosuvastatin (CRESTOR) 10 MG tablet Take 1 tablet by mouth once daily  . spironolactone (ALDACTONE) 25 MG tablet Take 1 tablet by mouth once daily  .  TORsemide (DEMADEX) 20 MG tablet TAKE 1 TABLET BY MOUTH 2 TIMES DAILY 180 tablet 1   No current Epic-ordered facility-administered medications on file.   Allergies: No Known Allergies   Review of Systems:  A comprehensive 14 point ROS was performed, reviewed by me today, and the pertinent orthopaedic findings are documented in the HPI.  Exam: BP 120/76  Ht 177.8 cm (5\' 10" )  Wt 79.4 kg (175 lb)  BMI 25.11 kg/m  General:  Well developed, well nourished, no apparent distress, normal affect, antalgic gait with a  walker  HEENT: Head normocephalic, atraumatic, PERRL.   Abdomen: Soft, non tender, non distended, Bowel sounds present.  Heart: Examination of the heart reveals regular, rate, and rhythm. There is no murmur noted on ascultation. There is a normal apical pulse.  Lungs: Lungs are clear to auscultation. There is no wheeze, rhonchi, or crackles. There is normal expansion of bilateral chest walls.   Examination of the thoracolumbar spine shows spinous process tenderness causing severe pain and grimacing with percussion along the thoracolumbar junction as well as along the lower lumbar spine. No paravertebral muscle tenderness. No sacral or SI joint tenderness. Sensation is intact throughout bilateral lower extremities. He has mild stiffness with bilateral hip range of motion causing a little bit of groin pain bilaterally.  AP and lateral views of the lumbar spine are ordered interpreted by me in the office today. Impression: Patient has severe lower lumbar facet arthritis with multilevel disc space degeneration throughout the lumbar spine. There is evidence of compression fracture along the superior endplate of L3 and C16. Patient has anterior listhesis of L4 on L5 with retrolisthesis of L2 on L3.  EXAM: MRI THORACIC SPINE WITHOUT CONTRAST  TECHNIQUE: Multiplanar, multisequence MR imaging of the thoracic spine was performed. No intravenous contrast was administered.  COMPARISON: X-ray 09/29/2017  FINDINGS: Alignment: Physiologic.  Vertebrae: Subacute appearing mild superior endplate compression fracture of T12, better characterized on concurrently obtained lumbar spine exam. The remaining thoracic vertebral bodies are intact. No additional fractures. Mild scattered degenerative endplate changes. No evidence of discitis. No suspicious bone lesion. Median sternotomy changes are evident.  Cord: Normal signal and morphology.  Paraspinal and other soft tissues: Negative.  Disc  levels:  T1-T2: No significant disc bulge. Bilateral facet arthropathy. No significant canal or foraminal stenosis.  T2-T3: Minimal disc bulge and mild bilateral facet arthropathy. No significant foraminal or canal stenosis.  T3-T4: No significant disc bulge. Bilateral facet arthropathy. Mild right foraminal stenosis. No significant canal stenosis.  T4-T5: No significant disc bulge. Minimal bilateral facet arthropathy. No foraminal or canal stenosis.  T5-T6: Small left paracentral disc protrusion. Minimal bilateral facet arthropathy. No foraminal or canal stenosis.  T6-T7: Small left paracentral disc protrusion. Minimal bilateral facet arthropathy. No foraminal or canal stenosis.  T7-T8: Small left paracentral disc protrusion. Minimal bilateral facet arthropathy. No foraminal or canal stenosis.  T8-T9: Mild diffuse disc bulge. Minimal bilateral facet arthropathy. No foraminal or canal stenosis.  T9-T10: No significant disc bulge. Mild left greater than right facet arthropathy resulting in mild bilateral foraminal stenosis. No canal stenosis.  T10-T11: Left paracentral disc bulge with bilateral facet arthropathy results in mild canal stenosis, moderate left foraminal stenosis and mild right foraminal stenosis.  T11-T12: Minimal disc bulge and moderate bilateral facet arthropathy without significant foraminal or canal stenosis.  IMPRESSION: 1. Subacute appearing mild superior endplate compression fracture of T12, better characterized on concurrently obtained lumbar spine exam. 2. The remaining thoracic vertebral body heights are maintained  without additional fracture. 3. Mild multilevel degenerative changes of the thoracic spine, as described above. Findings are most pronounced at the T10-11 level where there is mild canal stenosis, moderate left foraminal stenosis, and mild right foraminal stenosis.   CLINICAL DATA: Low back pain with bilateral  radiculopathy after fall 2 months ago  EXAM: MRI LUMBAR SPINE WITHOUT CONTRAST  TECHNIQUE: Multiplanar, multisequence MR imaging of the lumbar spine was performed. No intravenous contrast was administered.  COMPARISON: X-ray 11/27/2019  FINDINGS: Segmentation: For the purposes of this report, the lowest well developed disc space is designated L5-S1 with hypoplastic ribs at the T12 segment.  Alignment: Thoracic levocurvature. 5 mm grade 1 anterolisthesis L4 on L5. Slight retrolisthesis of L1 on L2 and L2 on L3.  Vertebrae: Mild superior endplate compression fracture of the T12 vertebral body with approximately 30% vertebral body height loss and relatively mild marrow edema superiorly. No bony retropulsion. No fracture extension into the posterior elements. Mild superior endplate compression deformity of the L3 vertebral body, also with approximately 30% vertebral body height loss. Mild superior endplate marrow edema. There is also a degenerative Schmorl's node located at the superior endplate. There is no bony retropulsion at L3 and no evidence of fracture extension into the posterior elements. Mild chronic wedging of the L1 vertebral body. Remaining vertebral bodies are within normal limits. Multilevel discogenic endplate marrow changes. No evidence of discitis. No suspicious bone lesion.  Conus medullaris and cauda equina: Conus extends to the L1 level. Conus and cauda equina appear normal.  Paraspinal and other soft tissues: Probable small right renal cyst, poorly characterized. No acute findings within the visualized paraspinal soft tissues.  Disc levels:  T12-L1: Disc height loss with diffuse disc bulge and mild bilateral facet arthrosis resulting in mild canal stenosis with moderate to severe right and mild left foraminal stenosis.  L1-L2: Retrolisthesis and mild diffuse disc bulge contribute to mild-to-moderate canal stenosis with moderate bilateral  foraminal stenosis, left worse than right.  L2-L3: Diffuse disc bulge with bilateral facet arthropathy and ligamentum flavum buckling result in severe canal stenosis with moderate to severe bilateral foraminal stenosis.  L3-L4: Diffuse disc bulge with left paracentral protrusion component. Bilateral facet arthropathy and ligamentum flavum buckling. Findings result in moderate canal stenosis with severe left and moderate right foraminal stenosis.  L4-L5: Grade 1 anterolisthesis with disc uncovering and mild diffuse disc bulge. Bilateral facet arthrosis. Findings result in moderate to severe canal stenosis with moderate to severe left and moderate right foraminal stenosis.  L5-S1: Shallow central disc protrusion and mild bilateral facet arthropathy. No canal stenosis or foraminal stenosis.  IMPRESSION: 1. Subacute appearing mild superior endplate compression fractures of the T12 and L3 vertebral bodies each with approximately 30% vertebral body height loss. No bony retropulsion or evidence of fracture extension into the posterior elements. 2. Advanced multilevel degenerative changes of the lumbar spine with severe L2-L3 and moderate-to-severe L4-L5 canal stenosis. Advanced multilevel bilateral foraminal stenosis, as above.   Electronically Signed By: Davina Poke D.O. On: 01/25/2020 14:20  Impression: T12 compression fracture, initial encounter (CMS-HCC) [S22.080A] T12 compression fracture, initial encounter (CMS-HCC) (primary encounter diagnosis) Closed compression fracture of L3 lumbar vertebra, initial encounter (CMS-HCC) Spondylolisthesis of lumbar region Lumbar spondylosis Bilateral lumbar radiculopathy  Plan:  1. Patient with acute T12 and L3 compression fractures. Will proceed with T12 and L3 kyphoplasty next week. Risks, benefits, complications of H63 and L3 kyphoplasty have been discussed with the patient, patient has agreed and consented to procedure with  Dr.  Hessie Knows. Patient will need to be off Eliquis 5 days prior to procedure. Patient also understands he has multiple levels of spinal stenosis, this is likely causing some degree of his leg pain, he understands the symptoms will not improve with kyphoplasty procedure and we may need to proceed with lumbar ESI injections following kyphoplasty.  This note was generated in part with voice recognition software and I apologize for any typographical errors that were not detected and corrected.  Feliberto Gottron MPA-C    Electronically signed by Feliberto Gottron, [place] at 01/25/2020 5:08 PM EDT  Reviewed paper H+P, . No changes noted.

## 2020-02-06 NOTE — Op Note (Signed)
Date 02/06/2020  time 11:10 AM   PATIENT:  Vincent Mason   PRE-OPERATIVE DIAGNOSIS:  closed wedge compression fracture of T12 and L3   POST-OPERATIVE DIAGNOSIS:  closed wedge compression fracture of T12 and L3   PROCEDURE:  Procedure(s): KYPHOPLASTY T12 and L3  SURGEON: Laurene Footman, MD   ASSISTANTS: None   ANESTHESIA:   local and MAC   EBL:  No intake/output data recorded.   BLOOD ADMINISTERED:none   DRAINS: none    LOCAL MEDICATIONS USED:  MARCAINE    and XYLOCAINE    SPECIMEN:   T12 and L3 vertebral body biopsy   DISPOSITION OF SPECIMEN:  Pathology   COUNTS:  YES   TOURNIQUET:  * No tourniquets in log *   IMPLANTS: Bone cement   DICTATION: .Dragon Dictation  patient was brought to the operating room and after adequate anesthesia was obtained the patient was placed prone.  C arm was brought in in good visualization of the affected level obtained on both AP and lateral projections.  After patient identification and timeout procedures were completed, local anesthetic was infiltrated with 10 cc 1% Xylocaine infiltrated subcutaneously.  This is done the area on the each side of the planned approach.  The back was then prepped and draped in the usual sterile manner and repeat timeout procedure carried out.  A spinal needle was brought down to the pedicle on the each side of  T12 and L3 and a 50-50 mix of 1% Xylocaine half percent Sensorcaine with epinephrine total of 20 cc injected on each side.  After allowing this to set a small incision was made and the trocar was advanced into the vertebral body in an extrapedicular fashion on each side at T12 in the right side at L3.  Biopsy was obtained at each level Drilling was carried out balloon inserted with inflation to  2-1/2 cc on the right and 2-1/2 cc on the left at T12 and on the right side up to 4 cc at L3.  When the cement was appropriate consistency 3-1/2 cc were injected on the right and 3-1/2 cc on the left into T12 and 8 at L3  without extravasation, good fill superior to inferior endplates and from right to left sides along the inferior endplate.  After the cement had set the trochar was removed and permanent C-arm views obtained.  The wounds were closed with Dermabond followed by Band-Aid   PLAN OF CARE:  Discharge to home after recovery room   PATIENT DISPOSITION:  PACU - hemodynamically stable.

## 2020-02-06 NOTE — Discharge Instructions (Addendum)
Take it easy today. Try to get walking as much as you can starting tomorrow.  Avoid bending and lifting for 2 weeks. Pain medicine as directed. Remove Band-Aids on Thursday then okay to shower. Call office if you are having problems. Resume Eliquis 02/07/20 tomorrow morning.  AMBULATORY SURGERY  DISCHARGE INSTRUCTIONS   1) The drugs that you were given will stay in your system until tomorrow so for the next 24 hours you should not:  A) Drive an automobile B) Make any legal decisions C) Drink any alcoholic beverage   2) You may resume regular meals tomorrow.  Today it is better to start with liquids and gradually work up to solid foods.  You may eat anything you prefer, but it is better to start with liquids, then soup and crackers, and gradually work up to solid foods.   3) Please notify your doctor immediately if you have any unusual bleeding, trouble breathing, redness and pain at the surgery site, drainage, fever, or pain not relieved by medication.    4) Additional Instructions:        Please contact your physician with any problems or Same Day Surgery at [phone], Monday through Friday 6 am to 4 pm, or [place] at [facility] number at [phone].

## 2020-02-07 ENCOUNTER — Telehealth: Payer: Self-pay | Admitting: Family Medicine

## 2020-02-07 DIAGNOSIS — M5136 Other intervertebral disc degeneration, lumbar region: Secondary | ICD-10-CM

## 2020-02-07 LAB — SURGICAL PATHOLOGY

## 2020-02-07 NOTE — Telephone Encounter (Signed)
Pt called to tell Dr. Caryn Section that he had his back surgery and it was a success but he is having trouble with his lower legs and wanted to see if Dr. Caryn Section can call in a medication that he has before for this issue/ please advise

## 2020-02-09 ENCOUNTER — Other Ambulatory Visit: Payer: Self-pay | Admitting: Family Medicine

## 2020-02-09 DIAGNOSIS — M5136 Other intervertebral disc degeneration, lumbar region: Secondary | ICD-10-CM

## 2020-02-09 NOTE — Telephone Encounter (Signed)
Requested medication (s) are due for refill today: yes  Requested medication (s) are on the active medication list: yes  Last refill:  02/05/20  #42  0 refills  Future visit scheduled:No  Notes to clinic:  filled 02/05/20  Not delegated    Requested Prescriptions  Pending Prescriptions Disp Refills   oxyCODONE (ROXICODONE) 15 MG immediate release tablet [Pharmacy Med Name: OXYCODONE HCL 15 MG TAB] 42 tablet     Sig: TAKE 1 TABLET BY MOUTH EVERY 4 HOURS AS NEEDED FOR PAIN, NOT TO EXCEED 6 TABLETSIN 1 DAY      Not Delegated - Analgesics:  Opioid Agonists Failed - 02/09/2020  3:42 PM      Failed - This refill cannot be delegated      Failed - Urine Drug Screen completed in last 360 days.      Passed - Valid encounter within last 6 months    Recent Outpatient Visits           3 months ago Essential hypertension   [facility] Birdie Sons, MD   7 months ago Uncomplicated opioid dependence [facility])   [facility] Birdie Sons, MD   9 months ago Essential hypertension   [facility] Birdie Sons, MD   11 months ago Internal hemorrhoid   [place], Vickki Muff, [place]   1 year ago Annual physical exam   [facility] Birdie Sons, MD

## 2020-02-09 NOTE — Telephone Encounter (Signed)
Medication: oxyCODONE (ROXICODONE) 15 MG immediate release tablet [[id number]] - Patient states that his pharmacy is called on Saturday afternoon.   Has the patient contacted their pharmacy? YES  (Agent: If no, request that the patient contact the pharmacy for the refill.) (Agent: If yes, when and what did the pharmacy advise?)  Preferred Pharmacy (with phone number or street name): [place], [place]  Phone:  [phone] Fax:  [phone]     Agent: Please be advised that RX refills may take up to 3 business days. We ask that you follow-up with your pharmacy.

## 2020-02-09 NOTE — Telephone Encounter (Signed)
Requested medication (s) are due for refill today: yes  Requested medication (s) are on the active medication list: yes  Last refill:  02/06/20  Future visit scheduled: yes  Notes to clinic:  not delegated    Requested Prescriptions  Pending Prescriptions Disp Refills   oxyCODONE (ROXICODONE) 15 MG immediate release tablet 42 tablet 0    Sig: Take 1 tablet (15 mg total) by mouth every 4 (four) hours as needed for pain. Not to exceed 6 tablets in 1 day. May dispense today 02/06/2020      Not Delegated - Analgesics:  Opioid Agonists Failed - 02/09/2020 10:05 AM      Failed - This refill cannot be delegated      Failed - Urine Drug Screen completed in last 360 days.      Passed - Valid encounter within last 6 months    Recent Outpatient Visits           3 months ago Essential hypertension   [facility] Birdie Sons, MD   7 months ago Uncomplicated opioid dependence [facility])   [facility] Birdie Sons, MD   9 months ago Essential hypertension   [facility] Birdie Sons, MD   11 months ago Internal hemorrhoid   [place], Vickki Muff, [place]   1 year ago Annual physical exam   [facility] Birdie Sons, MD

## 2020-02-12 MED ORDER — OXYCODONE HCL 15 MG PO TABS: 15.0000 mg | ORAL_TABLET | ORAL | 0 refills | Status: DC | PRN

## 2020-02-12 NOTE — Telephone Encounter (Signed)
Pt called to see if pain medication was sent to pharmacy and stated he is still in a lot of pain/ Please advise

## 2020-02-12 NOTE — Telephone Encounter (Signed)
Patient checking on the status of medication request mentioned below, stating back pain prior to surgery has not improved. Patient would like request expedited due to the pain.

## 2020-02-13 ENCOUNTER — Other Ambulatory Visit: Payer: Self-pay | Admitting: Family Medicine

## 2020-02-13 DIAGNOSIS — E790 Hyperuricemia without signs of inflammatory arthritis and tophaceous disease: Secondary | ICD-10-CM

## 2020-02-13 NOTE — Telephone Encounter (Signed)
Pt called in to request a refill for allopurinol (ZYLOPRIM) 100 MG tablet      pharmacy: [place], [place] [address]  [place], [place] [address]  Phone:  [phone] Fax:  [phone]

## 2020-02-13 NOTE — Telephone Encounter (Signed)
Requested medication (s) are due for refill today -no  Requested medication (s) are on the active medication list -yes  Future visit scheduled -yes  Last refill: 01/24/20 #60 0RF  Notes to clinic: Request medication written with no RF- sent for review   Requested Prescriptions  Pending Prescriptions Disp Refills   allopurinol (ZYLOPRIM) 100 MG tablet 60 tablet 0      Endocrinology:  Gout Agents Failed - 02/13/2020 11:01 AM      Failed - Cr in normal range and within 360 days    Creat  Date Value Ref Range Status  04/26/2017 2.42 (H) 0.70 - 1.18 mg/dL Final    Comment:    For patients >44 years of age, the reference limit for Creatinine is approximately 13% higher for people identified as African-American. .    Creatinine, Ser  Date Value Ref Range Status  11/27/2019 2.03 (H) 0.61 - 1.24 mg/dL Final   Creatinine, Urine  Date Value Ref Range Status  09/03/2015 55.43 mg/dL Final          Passed - Uric Acid in normal range and within 360 days    Uric Acid  Date Value Ref Range Status  05/12/2019 7.2 3.8 - 8.4 mg/dL Final    Comment:               Therapeutic target for gout patients: <6.0               **Please note reference interval change**           Passed - Valid encounter within last 12 months    Recent Outpatient Visits           3 months ago Essential hypertension   [facility] Birdie Sons, MD   7 months ago Uncomplicated opioid dependence [facility])   [facility] Birdie Sons, MD   9 months ago Essential hypertension   [facility] Birdie Sons, MD   11 months ago Internal hemorrhoid   [place], Vickki Muff, [place]   1 year ago Annual physical exam   [facility] Birdie Sons, MD                  Requested Prescriptions  Pending Prescriptions Disp Refills   allopurinol (ZYLOPRIM) 100 MG tablet 60 tablet 0      Endocrinology:  Gout Agents Failed -  02/13/2020 11:01 AM      Failed - Cr in normal range and within 360 days    Creat  Date Value Ref Range Status  04/26/2017 2.42 (H) 0.70 - 1.18 mg/dL Final    Comment:    For patients >37 years of age, the reference limit for Creatinine is approximately 13% higher for people identified as African-American. .    Creatinine, Ser  Date Value Ref Range Status  11/27/2019 2.03 (H) 0.61 - 1.24 mg/dL Final   Creatinine, Urine  Date Value Ref Range Status  09/03/2015 55.43 mg/dL Final          Passed - Uric Acid in normal range and within 360 days    Uric Acid  Date Value Ref Range Status  05/12/2019 7.2 3.8 - 8.4 mg/dL Final    Comment:               Therapeutic target for gout patients: <6.0               **Please note reference interval change**  Passed - Valid encounter within last 12 months    Recent Outpatient Visits           3 months ago Essential hypertension   [facility] Birdie Sons, MD   7 months ago Uncomplicated opioid dependence [facility])   [facility] Birdie Sons, MD   9 months ago Essential hypertension   [facility] Birdie Sons, MD   11 months ago Internal hemorrhoid   [place], Vickki Muff, [place]   1 year ago Annual physical exam   [facility] Birdie Sons, MD

## 2020-02-14 ENCOUNTER — Other Ambulatory Visit: Payer: Self-pay | Admitting: Family Medicine

## 2020-02-14 DIAGNOSIS — E790 Hyperuricemia without signs of inflammatory arthritis and tophaceous disease: Secondary | ICD-10-CM

## 2020-02-14 NOTE — Telephone Encounter (Signed)
Duplicate request for pended for review medication. Sent for review 02/13/20.

## 2020-02-21 ENCOUNTER — Other Ambulatory Visit: Payer: Self-pay | Admitting: Family Medicine

## 2020-02-21 DIAGNOSIS — M5136 Other intervertebral disc degeneration, lumbar region: Secondary | ICD-10-CM

## 2020-02-21 NOTE — Telephone Encounter (Signed)
Pt is calling and he said the pharm does not have oxycodone 15 mg that was sent in on 02-12-2020. [facility]

## 2020-02-22 ENCOUNTER — Ambulatory Visit: Payer: Medicare Other | Admitting: Physician Assistant

## 2020-02-22 NOTE — Telephone Encounter (Signed)
Please advise 

## 2020-02-28 ENCOUNTER — Other Ambulatory Visit: Payer: Self-pay | Admitting: Family Medicine

## 2020-02-28 DIAGNOSIS — M5136 Other intervertebral disc degeneration, lumbar region: Secondary | ICD-10-CM

## 2020-02-28 NOTE — Telephone Encounter (Signed)
Pt is calling checking on the status of refill. Pt said he is due today

## 2020-02-28 NOTE — Telephone Encounter (Signed)
Requested medication (s) are due for refill today -yes  Requested medication (s) are on the active medication list -yes  Future visit scheduled -yes  Last refill: 01/24/20  Notes to clinic: Request RF of Rx by historical provider  Requested Prescriptions  Pending Prescriptions Disp Refills   gabapentin (NEURONTIN) 300 MG capsule [Pharmacy Med Name: GABAPENTIN 300 MG CAP] 60 capsule     Sig: TAKE 1 CAPSULE BY MOUTH TWICE DAILY      Neurology: Anticonvulsants - gabapentin Passed - 02/28/2020  2:53 PM      Passed - Valid encounter within last 12 months    Recent Outpatient Visits           4 months ago Essential hypertension   [facility] Birdie Sons, MD   8 months ago Uncomplicated opioid dependence [facility])   [facility] Birdie Sons, MD   9 months ago Essential hypertension   [facility] Birdie Sons, MD   1 year ago Internal hemorrhoid   [facility], [place], [place]   1 year ago Annual physical exam   [facility] Birdie Sons, MD                  Requested Prescriptions  Pending Prescriptions Disp Refills   gabapentin (NEURONTIN) 300 MG capsule [Pharmacy Med Name: GABAPENTIN 300 MG CAP] 60 capsule     Sig: TAKE 1 CAPSULE BY MOUTH TWICE DAILY      Neurology: Anticonvulsants - gabapentin Passed - 02/28/2020  2:53 PM      Passed - Valid encounter within last 12 months    Recent Outpatient Visits           4 months ago Essential [facility]   [facility]   8 months ago Uncomplicated opioid dependence [facility])   [facility] Birdie Sons, MD   9 months ago Essential hypertension   [facility] Birdie Sons, MD   1 year ago Internal hemorrhoid   [place], Vickki Muff, [place]   1 year ago Annual physical exam   [facility] Birdie Sons, MD

## 2020-02-28 NOTE — Telephone Encounter (Signed)
This is a duplicate request for refill of Oxycodone.  Refill request has already been pended and sent through for PCP review/ recommendation.

## 2020-02-28 NOTE — Telephone Encounter (Signed)
Pt states he is out of medication and he states it is due today. Pt requesting refill  oxyCODONE (ROXICODONE) 15 MG immediate release tablet  [facility] - [place],  - [place] Phone:  [phone]  Fax:  [phone]

## 2020-02-28 NOTE — Telephone Encounter (Signed)
Requested medication (s) are due for refill today:  Yes  Requested medication (s) are on the active medication list:  Yes  Future visit scheduled:  No  Last Refill: 02/21/20; #42/ no refills  Notes to Clinic:  Not delegated  Requested Prescriptions  Pending Prescriptions Disp Refills   oxyCODONE (ROXICODONE) 15 MG immediate release tablet [Pharmacy Med Name: OXYCODONE HCL 15 MG TAB] 42 tablet     Sig: TAKE 1 TABLET (15 MG) BY MOUTH EVERY 4 HOURS AS NEEDED FOR PAIN. NOT TO EXCEED 6 TABLETS IN 1 DAY.      Not Delegated - Analgesics:  Opioid Agonists Failed - 02/28/2020 10:19 AM      Failed - This refill cannot be delegated      Failed - Urine Drug Screen completed in last 360 days.      Passed - Valid encounter within last 6 months    Recent Outpatient Visits           4 months ago Essential hypertension   [facility] Birdie Sons, MD   8 months ago Uncomplicated opioid dependence [facility])   [facility] Birdie Sons, MD   9 months ago Essential hypertension   [facility] Birdie Sons, MD   1 year ago Internal hemorrhoid   [place], Vickki Muff, [place]   1 year ago Annual physical exam   [facility] Birdie Sons, MD

## 2020-02-29 ENCOUNTER — Other Ambulatory Visit: Payer: Self-pay | Admitting: Family Medicine

## 2020-02-29 NOTE — Telephone Encounter (Signed)
Phone call to pharmacy to confirm that Rx for Oxycodone was received this AM.  Spoke with Aleene Davidson; confirmed that Rx was rec'd and is ready for pick-up.  Phone call to pt.  Advised that Oxycodone is ready to be picked up at the Pharmacy.  Verb. Understanding.

## 2020-02-29 NOTE — Telephone Encounter (Signed)
Copied from [place] [id number]. Topic: Quick Communication - Rx Refill/Question >> Feb 29, 2020  9:01 AM Yvette Rack wrote: Medication: oxyCODONE (ROXICODONE) 15 MG immediate release tablet  Has the patient contacted their pharmacy? no  Preferred Pharmacy (with phone number or street name): [facility] - [place], [place] - [place]  Phone: [phone]   Fax: [phone]  Agent: Please be advised that RX refills may take up to 3 business days. We ask that you follow-up with your pharmacy.

## 2020-03-04 ENCOUNTER — Ambulatory Visit: Payer: Self-pay

## 2020-03-04 NOTE — Telephone Encounter (Signed)
Patient called stating that he has severe leg pain. It is both legs from the knee down.  He states that he has had recent back surgery which was causing leg pain but this is new pain.  He states it is when he gets up and down He denies swelling. He can lay in bed and not have pain. He states that he has been taking allopurinol for pain but it does not work. He states he has neuropathy in his feet. He rates this pain 10.  stating he wishes he could sleep. Per protocol patient will go to UC for evaluation today. He was urged to call back for scheduling of OV for continued care for this issue. He refused to see another provider in office today.   Reason for Disposition . [1] SEVERE pain (e.g., excruciating, unable to do any normal activities) AND [2] not improved after 2 hours of pain medicine  Answer Assessment - Initial Assessment Questions 1. ONSET: "When did the pain start?"      Last night 2. LOCATION: "Where is the pain located?"    Both legs from knees down 3. PAIN: "How bad is the pain?"    (Scale 1-10; or mild, moderate, severe)   -  MILD (1-3): doesn't interfere with normal activities    -  MODERATE (4-7): interferes with normal activities (e.g., work or school) or awakens from sleep, limping    -  SEVERE (8-10): excruciating pain, unable to do any normal activities, unable to walk   10 4. WORK OR EXERCISE: "Has there been any recent work or exercise that involved this part of the body?"     No 5. CAUSE: "What do you think is causing the leg pain?"    Unsure takes alopurinol. Has had back surgery 3 weeks ago 6. OTHER SYMPTOMS: "Do you have any other symptoms?" (e.g., chest pain, back pain, breathing difficulty, swelling, rash, fever, numbness, weakness)     Toes and bottom of feet numbness but possible neuropathy 7. PREGNANCY: "Is there any chance you are pregnant?" "When was your last menstrual period?"     N/A  Protocols used: LEG PAIN-A-AH

## 2020-03-06 ENCOUNTER — Other Ambulatory Visit: Payer: Self-pay | Admitting: Family Medicine

## 2020-03-06 DIAGNOSIS — M5136 Other intervertebral disc degeneration, lumbar region: Secondary | ICD-10-CM

## 2020-03-06 NOTE — Telephone Encounter (Signed)
Requested medication (s) are due for refill today: yes  Requested medication (s) are on the active medication list: yes  Last refill:  02/29/20  Future visit scheduled: yes  Notes to clinic:  not delegated    Requested Prescriptions  Pending Prescriptions Disp Refills   oxyCODONE (ROXICODONE) 15 MG immediate release tablet [Pharmacy Med Name: OXYCODONE HCL 15 MG TAB] 42 tablet     Sig: TAKE 1 TABLET (15 MG) BY MOUTH EVERY 4 HOURS AS NEEDED FOR PAIN. NOT TO EXCEED 6 TABLETS IN 1 DAY.      Not Delegated - Analgesics:  Opioid Agonists Failed - 03/06/2020 10:03 AM      Failed - This refill cannot be delegated      Failed - Urine Drug Screen completed in last 360 days.      Passed - Valid encounter within last 6 months    Recent Outpatient Visits           4 months ago Essential hypertension   [facility] Birdie Sons, MD   8 months ago Uncomplicated opioid dependence [facility])   [facility] Birdie Sons, MD   9 months ago Essential hypertension   [facility] Bird[facility], MD   1 year ago Internal hemorrhoid   [place], Vickki Muff, [place]   1 year ago Annual physical exam   [facility] Birdie Sons, MD

## 2020-03-06 NOTE — Telephone Encounter (Signed)
Pt called stating that he is out of medication. Please advise.

## 2020-03-11 ENCOUNTER — Telehealth: Payer: Self-pay

## 2020-03-11 DIAGNOSIS — M5136 Other intervertebral disc degeneration, lumbar region: Secondary | ICD-10-CM
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
## 2020-03-12 ENCOUNTER — Other Ambulatory Visit: Payer: Self-pay | Admitting: Family Medicine

## 2020-03-12 DIAGNOSIS — M5136 Other intervertebral disc degeneration, lumbar region: Secondary | ICD-10-CM

## 2020-03-12 MED ORDER — OXYCODONE HCL 15 MG PO TABS: ORAL_TABLET | ORAL | 0 refills | Status: DC

## 2020-03-12 NOTE — Telephone Encounter (Signed)
Was already sent to pharmacy this afternoon.

## 2020-03-12 NOTE — Telephone Encounter (Signed)
Pt is calling checking on the refill 

## 2020-03-12 NOTE — Telephone Encounter (Signed)
  Notes to clinic: pharmacy didn't receive the script  Please resend    Requested Prescriptions  Pending Prescriptions Disp Refills   oxyCODONE (ROXICODONE) 15 MG immediate release tablet 42 tablet 0      Not Delegated - Analgesics:  Opioid Agonists Failed - 03/12/2020  9:13 AM      Failed - This refill cannot be delegated      Failed - Urine Drug Screen completed in last 360 days.      Passed - Valid encounter within last 6 months    Recent Outpatient Visits           4 months ago Essential hypertension   [facility] Birdie Sons, MD   8 months ago Uncomplicated opioid dependence [facility])   [facility] Birdie Sons, MD   10 months ago Essential hypertension   [facility] Birdie Sons, MD   1 year ago Internal hemorrhoid   [place], Vickki Muff, [place]   1 year ago Annual physical exam   [facility] Birdie Sons, MD

## 2020-03-12 NOTE — Telephone Encounter (Signed)
Medication Refill - Medication: oxyCODONE (ROXICODONE) 15 MG immediate release tablet   Has the patient contacted their pharmacy? No. (Agent: If no, request that the patient contact the pharmacy for the refill.) (Agent: If yes, when and what did the pharmacy advise?)  Preferred Pharmacy (with phone number or street name):  [place], [place] - [address] [place] [address]  Phone:  [phone] Fax:  [phone]  Agent: Please be advised that RX refills may take up to 3 business days. We ask that you follow-up with your pharmacy.

## 2020-03-13 NOTE — Telephone Encounter (Signed)
I called and spoke with pharmacy. Pharmacist confirmed that they did receive prescription and patient has already picked up prescription that was sent in on 03/12/2020.

## 2020-03-18 ENCOUNTER — Telehealth: Payer: Self-pay | Admitting: Family Medicine

## 2020-03-18 ENCOUNTER — Other Ambulatory Visit: Payer: Self-pay | Admitting: Family Medicine

## 2020-03-18 DIAGNOSIS — M5136 Other intervertebral disc degeneration, lumbar region: Secondary | ICD-10-CM

## 2020-03-18 NOTE — Telephone Encounter (Signed)
Patient called to ask if the doctor could change his medication for oxyCODONE (ROXICODONE) 15 MG immediate release tablet to receiving it once a month instead of weekly.  Please advise and call patient back to discuss.  CB# [phone]

## 2020-03-18 NOTE — Telephone Encounter (Signed)
Pt called in for assistance. Pt says that he was told by his pharmacy that the rx for oxyCODONE (ROXICODONE) 15 MG immediate release tablet wasn't received.    Pt would like further assistance.

## 2020-03-18 NOTE — Telephone Encounter (Signed)
Requested medication (s) are due for refill today:due 03/19/20  Requested medication (s) are on the active medication list:yes  Last refill: 03/12/20  # 42    0 refills  Future visit scheduled   yes   04/02/20  Notes to clinic: not delegated  Requested Prescriptions  Pending Prescriptions Disp Refills   oxyCODONE (ROXICODONE) 15 MG immediate release tablet [Pharmacy Med Name: OXYCODONE HCL 15 MG TAB] 42 tablet     Sig: TAKE 1 TABLET BY MOUTH EVERY 4 HOURS AS NEEDED FOR PAIN.      Not Delegated - Analgesics:  Opioid Agonists Failed - 03/18/2020  4:53 PM      Failed - This refill cannot be delegated      Failed - Urine Drug Screen completed in last 360 days.      Passed - Valid encounter within last 6 months    Recent Outpatient Visits           4 months ago Essential hypertension   [facility] Birdie Sons, MD   8 months ago Uncomplicated opioid dependence [facility])   [facility] Birdie Sons, MD   10 months ago Essential hypertension   [facility] Birdie Sons, MD   1 year ago Internal hemorrhoid   [place], Vickki Muff, [place]   1 year ago Annual physical exam   [facility] Birdie Sons, MD

## 2020-03-18 NOTE — Telephone Encounter (Signed)
Requested medication (s) are due for refill today: No  Requested medication (s) are on the active medication list: Yes  Last refill:  03/18/20  Future visit scheduled: Yes  Notes to clinic:  Unable to refuse medication, it was already refilled, routing to provider to refuse     Requested Prescriptions  Pending Prescriptions Disp Refills   oxyCODONE (ROXICODONE) 15 MG immediate release tablet 42 tablet 0    Sig: TAKE 1 TABLET BY MOUTH EVERY 4 HOURS AS NEEDED FOR PAIN.      Not Delegated - Analgesics:  Opioid Agonists Failed - 03/18/2020  5:54 PM      Failed - This refill cannot be delegated      Failed - Urine Drug Screen completed in last 360 days.      Passed - Valid encounter within last 6 months    Recent Outpatient Visits           4 months ago Essential hypertension   [facility] Birdie Sons, MD   8 months ago Uncomplicated opioid dependence [facility])   [facility] Birdie Sons, MD   10 months ago Essential hypertension   [facility] Birdie Sons, MD   1 year ago Internal hemorrhoid   [place], Vickki Muff, [place]   1 year ago Annual physical exam   [facility] Birdie Sons, MD

## 2020-03-18 NOTE — Telephone Encounter (Signed)
oxyCODONE (ROXICODONE) 15 MG immediate release tablet Medication Date: 03/12/2020 Department: [facility] Practice Ordering/Authorizing: Birdie Sons, MD   Pharmacy says pt is calling stating out of weekly med above and needing a refill asap.  [place], [place] Phone:  [phone]  Fax:  [phone]

## 2020-03-20 ENCOUNTER — Telehealth: Payer: Self-pay | Admitting: Family Medicine

## 2020-03-20 NOTE — Telephone Encounter (Signed)
Pt would like to ask Dr Caryn Section if he will start giving him a month supply of his pain medicine one month at a time instead of a week at a time.  Pt states it is getting inconvenient, and Dr Caryn Section is sometimes out of the office. Please advise.

## 2020-03-25 ENCOUNTER — Other Ambulatory Visit: Payer: Self-pay | Admitting: Family Medicine

## 2020-03-25 DIAGNOSIS — M5136 Other intervertebral disc degeneration, lumbar region: Secondary | ICD-10-CM

## 2020-03-25 NOTE — Telephone Encounter (Signed)
Requested medication (s) are due for refill today: no   Requested medication (s) are on the active medication list: yes  Last refill:  03/25/20 #84 0 refills  Future visit scheduled: no  Notes to clinic:  not delegated per protocol; pharmacy confirmed receipt Rx has already been sent .     Requested Prescriptions  Pending Prescriptions Disp Refills   oxyCODONE (ROXICODONE) 15 MG immediate release tablet 42 tablet 0    Sig: TAKE 1 TABLET BY MOUTH EVERY 4 HOURS AS NEEDED FOR PAIN.      Not Delegated - Analgesics:  Opioid Agonists Failed - 03/25/2020  9:04 AM      Failed - This refill cannot be delegated      Failed - Urine Drug Screen completed in last 360 days      Passed - Valid encounter within last 6 months    Recent Outpatient Visits           5 months ago Essential hypertension   [facility] Birdie Sons, MD   9 months ago Uncomplicated opioid dependence [facility])   [facility] Bird[facility], [facility]   10 months ago Essential hypertension   [facility] Birdie Sons, MD   1 year ago Internal hemorrhoid   [place], Vickki Muff, [place]   1 year ago Annual physical exam   [facility] Birdie Sons, MD

## 2020-03-25 NOTE — Telephone Encounter (Signed)
PT needsa refill  oxyCODONE (ROXICODONE) 15 MG immediate release tablet [[id number]]  [facility] - [place], [place] - [place]  [place] [facility] [place] [address]  Phone: [phone] Fax: [phone]

## 2020-03-29 ENCOUNTER — Other Ambulatory Visit: Payer: Self-pay | Admitting: Family Medicine

## 2020-03-29 DIAGNOSIS — E790 Hyperuricemia without signs of inflammatory arthritis and tophaceous disease: Secondary | ICD-10-CM

## 2020-03-29 NOTE — Telephone Encounter (Signed)
Medication Refill - Medication: Allopurinol   Has the patient contacted their pharmacy? Yes.   Pt states that he is completely out of this medication. Please advise.  (Agent: If no, request that the patient contact the pharmacy for the refill.) (Agent: If yes, when and what did the pharmacy advise?)  Preferred Pharmacy (with phone number or street name):  [facility] - [place], [place] - [address] [address]  [place] [address]  Phone: [phone] Fax: [phone]  Hours: Not open 24 hours     Agent: Please be advised that RX refills may take up to 3 business days. We ask that you follow-up with your pharmacy.

## 2020-04-02 ENCOUNTER — Encounter: Payer: Self-pay | Admitting: Family Medicine

## 2020-04-02 ENCOUNTER — Other Ambulatory Visit: Payer: Self-pay

## 2020-04-02 ENCOUNTER — Ambulatory Visit (INDEPENDENT_AMBULATORY_CARE_PROVIDER_SITE_OTHER): Payer: Medicare Other | Admitting: Family Medicine

## 2020-04-02 VITALS — BP 121/72 | HR 79 | Temp 98.2°F | Resp 16 | Ht 70.0 in | Wt 164.0 lb

## 2020-04-02 DIAGNOSIS — G894 Chronic pain syndrome: Secondary | ICD-10-CM

## 2020-04-02 DIAGNOSIS — E790 Hyperuricemia without signs of inflammatory arthritis and tophaceous disease: Secondary | ICD-10-CM

## 2020-04-02 DIAGNOSIS — E559 Vitamin D deficiency, unspecified: Secondary | ICD-10-CM

## 2020-04-02 DIAGNOSIS — N1832 Chronic kidney disease, stage 3b: Secondary | ICD-10-CM

## 2020-04-02 DIAGNOSIS — I2510 Atherosclerotic heart disease of native coronary artery without angina pectoris: Secondary | ICD-10-CM | POA: Diagnosis not present

## 2020-04-02 DIAGNOSIS — I272 Pulmonary hypertension, unspecified: Secondary | ICD-10-CM

## 2020-04-02 DIAGNOSIS — I10 Essential (primary) hypertension: Secondary | ICD-10-CM

## 2020-04-02 DIAGNOSIS — Z23 Encounter for immunization: Secondary | ICD-10-CM | POA: Diagnosis not present

## 2020-04-02 DIAGNOSIS — M51369 Other intervertebral disc degeneration, lumbar region without mention of lumbar back pain or lower extremity pain: Secondary | ICD-10-CM

## 2020-04-02 DIAGNOSIS — Z0000 Encounter for general adult medical examination without abnormal findings: Secondary | ICD-10-CM | POA: Diagnosis not present

## 2020-04-02 DIAGNOSIS — M5136 Other intervertebral disc degeneration, lumbar region: Secondary | ICD-10-CM

## 2020-04-02 DIAGNOSIS — M791 Myalgia, unspecified site: Secondary | ICD-10-CM | POA: Diagnosis not present

## 2020-04-02 NOTE — Progress Notes (Signed)
Complete physical exam   Patient: Vincent Mason   DOB: Jan 28, 1936   84 y.o. Male  MRN: [id number] Visit Date: 04/02/2020  Today's healthcare provider: Lelon Huh, MD   Chief Complaint  Patient presents with  . Annual Exam  . Hyperlipidemia  . Hypertension  . Pain Management   Subjective    [name] is a 84 y.o. male who presents today for a complete physical exam.  He reports consuming a general diet. The patient does not participate in regular exercise at present. He generally feels fairly well. He reports sleeping fairly well. He does not have additional problems to discuss today.   Had AWV with [name] on 12/28/2019  HPI  Follow up for chronic back pain:  The patient was last seen for this 10/25/2019.  From that visit, patient reported that oxycodone works, but he occasionally takes an extra tablet. He is currently prescribed 4 per day, 120 per month. Advised patient that we would change next prescription to up to 5 a day or 150 per month.  He reports good compliance with treatment. He feels that condition is Improved. He is not having side effects.   -----------------------------------------------------------------------------------------  Hypertension, follow-up  BP Readings from Last 3 Encounters:  04/02/20 121/72  02/06/20 (!) 145/62  01/02/20 (!) 147/81   Wt Readings from Last 3 Encounters:  04/02/20 164 lb (74.4 kg)  02/02/20 175 lb (79.4 kg)  01/02/20 167 lb 6.4 oz (75.9 kg)     He was last seen for hypertension 5 months ago.  BP at that visit was 120/78. Management since that visit includes continue same medication.  He reports good compliance with treatment. He is not having side effects.  He is following a Regular diet.  He is not exercising. He does not smoke.  Use of agents associated with hypertension: none.   Outside blood pressures are not checked. Symptoms: No chest pain No chest pressure  No palpitations No syncope  No dyspnea  No orthopnea  No paroxysmal nocturnal dyspnea No lower extremity edema   Pertinent labs: Lab Results  Component Value Date   CHOL 110 05/12/2019   HDL 36 (L) 05/12/2019   LDLCALC 52 05/12/2019   TRIG 123 05/12/2019   CHOLHDL 3.1 05/12/2019   Lab Results  Component Value Date   NA 140 11/27/2019   K 4.3 11/27/2019   CREATININE 2.03 (H) 11/27/2019   GFRNONAA 30 (L) 11/27/2019   GFRAA 34 (L) 11/27/2019   GLUCOSE 113 (H) 11/27/2019     The ASCVD Risk score Mikey Bussing DC Jr., et al., 2013) failed to calculate for the following reasons:   The 2013 ASCVD risk score is only valid for ages 66 to 80   ---------------------------------------------------------------------------------------------------  Lipid/Cholesterol, Follow-up  Last lipid panel Other pertinent labs  Lab Results  Component Value Date   CHOL 110 05/12/2019   HDL 36 (L) 05/12/2019   LDLCALC 52 05/12/2019   TRIG 123 05/12/2019   CHOLHDL 3.1 05/12/2019   Lab Results  Component Value Date   ALT 12 05/12/2019   AST 20 05/12/2019   PLT 266 11/27/2019   TSH 4.085 08/07/2017     He was last seen for this 5 months ago.  Management since that visit includes continue same medication.  He reports good compliance with treatment. He is not having side effects.   Symptoms: No chest pain No chest pressure/discomfort  No dyspnea No lower extremity edema  No numbness or tingling of  extremity No orthopnea  No palpitations No paroxysmal nocturnal dyspnea  No speech difficulty No syncope   Current diet: well balanced Current exercise: none  The ASCVD Risk score (Coleman., et al., 2013) failed to calculate for the following reasons:   The 2013 ASCVD risk score is only valid for ages 42 to 62  ---------------------------------------------------------------------------------------------------  Follow up for Hyperuricemia:  The patient was last seen for this 5 months ago. Changes made at last visit include none.  He  reports good compliance with treatment. He feels that condition is Unchanged. He is not having side effects.   -----------------------------------------------------------------------------------------    Past Medical History:  Diagnosis Date  . Acute encephalopathy 09/03/2015  . Atrial fibrillation and flutter [facility]) 2013   Dr Nehemiah Massed at [facility]  on [address].   . C. difficile colitis 09/12/2015  . COPD (chronic obstructive pulmonary disease) ([place])   . HCAP (healthcare-associated pneumonia) 09/12/2015  . Hyperlipidemia   . Hypertension   . Kidney disease, chronic, stage III (moderate, EGFR 30-59 ml/min) ([place])    ARF 2013  . OSA (obstructive sleep apnea)    non-compliant with CPAP.   Marland Kitchen Severe sepsis with septic shock ([place]) 09/12/2015   Past Surgical History:  Procedure Laterality Date  . CATARACT EXTRACTION    . CORONARY ARTERY BYPASS GRAFT  10/1997  . EYE SURGERY    . history of cervical discectomy  2009   C5-C6  . Hyperplastic colon polyp  02/2002   Sigmoid polyps  . KYPHOPLASTY N/A 02/06/2020   Procedure: T12 & L3 Kyphoplasty;  Surgeon: Hessie Knows, MD;  Location: [facility] ORS;  Service: Orthopedics;  Laterality: N/A;   Social History   Socioeconomic History  . Marital status: Married    Spouse name: Not on file  . Number of children: 3  . Years of education: some colle  . Highest education level: Some college, no degree  Occupational History  . Occupation: Retired  Tobacco Use  . Smoking status: Former Research scientist (life sciences)  . Smokeless tobacco: Never Used  . Tobacco comment: Quit in 1990  Vaping Use  . Vaping Use: Never used  Substance and Sexual Activity  . Alcohol use: No  . Drug use: No  . Sexual activity: Yes  Other Topics Concern  . Not on file  Social History Narrative  . Not on file   Social Determinants of Health   Financial Resource Strain: Low Risk   . Difficulty of Paying Living Expenses: Not hard at all  Food Insecurity: No Food Insecurity  . Worried About  Charity fundraiser in the Last Year: Never true  . Ran Out of Food in the Last Year: Never true  Transportation Needs: No Transportation Needs  . Lack of Transportation (Medical): No  . Lack of Transportation (Non-Medical): No  Physical Activity: Inactive  . Days of Exercise per Week: 0 days  . Minutes of Exercise per Session: 0 min  Stress: Stress Concern Present  . Feeling of Stress : Rather much  Social Connections: Socially Isolated  . Frequency of Communication with Friends and Family: Twice a week  . Frequency of Social Gatherings with Friends and Family: Never  . Attends Religious Services: Never  . Active Member of Clubs or Organizations: No  . Attends Archivist Meetings: Never  . Marital Status: Married  Human resources officer Violence: Not At Risk  . Fear of Current or Ex-Partner: No  . Emotionally Abused: No  . Physically Abused: No  . Sexually  Abused: No   Family Status  Relation Name Status  . Mother  Deceased at age 73's       died from an MI  . Father  Deceased at age 64's       died from an MI  . Brother 1 Deceased       Emphysema; Cause of death uncertain  . Brother 2 Alive       Heart issues   Family History  Problem Relation Age of Onset  . Hypertension Mother   . Hypertension Father    No Known Allergies  Patient Care Team: Birdie Sons, MD as PCP - General (Family Medicine) Corey Skains, MD as Consulting Physician (Internal Medicine) Estill Cotta, MD as Consulting Physician (Ophthalmology) Murlean Iba, MD as Consulting Physician (Internal Medicine) Gillis Santa, MD as Consulting Physician (Pain Medicine)   Medications: Outpatient Medications Prior to Visit  Medication Sig  . allopurinol (ZYLOPRIM) 100 MG tablet Take 2 tablets (200 mg total) by mouth daily.  Marland Kitchen apixaban (ELIQUIS) 5 MG TABS tablet Take 1/2 tablet twice a day (Patient taking differently: Take 5 mg by mouth 2 (two) times daily. )  . Cholecalciferol (VITAMIN  D-3) 125 MCG (5000 UT) TABS Take 5,000 Units by mouth daily.  . Cholecalciferol (VITAMIN D3) 1000 units CAPS Take 2,000 Units by mouth daily.   . ferrous sulfate 325 (65 FE) MG tablet Take 1 tablet (325 mg total) by mouth daily.  Marland Kitchen gabapentin (NEURONTIN) 300 MG capsule Take 1 capsule (300 mg total) by mouth 2 (two) times daily.  . metoprolol succinate (TOPROL-XL) 25 MG 24 hr tablet TAKE 1/2 A TABLET BY MOUTH DAILY (Patient taking differently: Take 12.5 mg by mouth daily. )  . oxyCODONE (ROXICODONE) 15 MG immediate release tablet TAKE 1 TABLET BY MOUTH EVERY 4 HOURS AS NEEDED FOR PAIN.  Marland Kitchen polyethylene glycol (MIRALAX / GLYCOLAX) packet Take 17 g by mouth daily. (Patient taking differently: Take 17 g by mouth daily as needed for mild constipation. )  . PROCTOZONE-HC 2.5 % rectal cream PLACE 1 APPLICATION RECTALLY TWICE DAILYAS DIRECTED (Patient taking differently: Place 1 application rectally 2 (two) times daily as needed (discomfort/hemorrhoids). )  . rosuvastatin (CRESTOR) 10 MG tablet Take 1 tablet (10 mg total) by mouth daily. (Patient taking differently: Take 10 mg by mouth at bedtime. )  . spironolactone (ALDACTONE) 25 MG tablet Take 1 tablet by mouth daily.  Marland Kitchen torsemide (DEMADEX) 20 MG tablet Take one daily as needed for excessive swelling (Patient taking differently: Take 20 mg by mouth daily. )   No facility-administered medications prior to visit.    Review of Systems  Constitutional: Negative for appetite change, chills, fatigue and fever.  HENT: Negative for congestion, ear pain, hearing loss, nosebleeds and trouble swallowing.   Eyes: Negative for pain and visual disturbance.  Respiratory: Negative for cough, chest tightness and shortness of breath.   Cardiovascular: Negative for chest pain, palpitations and leg swelling.  Gastrointestinal: Negative for abdominal pain, blood in stool, constipation, diarrhea, nausea and vomiting.  Endocrine: Negative for polydipsia, polyphagia and  polyuria.  Genitourinary: Negative for dysuria and flank pain.  Musculoskeletal: Positive for arthralgias (leg pain) and back pain. Negative for joint swelling, myalgias and neck stiffness.  Skin: Negative for color change, rash and wound.  Neurological: Negative for dizziness, tremors, seizures, speech difficulty, weakness, light-headedness and headaches.  Psychiatric/Behavioral: Negative for behavioral problems, confusion, decreased concentration, dysphoric mood and sleep disturbance. The patient is not nervous/anxious.  All other systems reviewed and are negative.     Objective    BP 121/72 (BP Location: Right Arm, Patient Position: Sitting, Cuff Size: Normal)   Pulse 79   Temp 98.2 F (36.8 C) (Oral)   Resp 16   Ht 5' 10"  (1.778 m)   Wt 164 lb (74.4 kg)   BMI 23.53 kg/m    Physical Exam   General Appearance:    Well developed, well nourished male. Alert, cooperative, in no acute distress, appears stated age  Head:    Normocephalic, without obvious abnormality, atraumatic  Eyes:    PERRL, conjunctiva/corneas clear, EOM's intact, fundi    benign, both eyes       Ears:    Normal TM's and external ear canals, both ears  Nose:   Nares normal, septum midline, mucosa normal, no drainage   or sinus tenderness  Throat:   Lips, mucosa, and tongue normal; teeth and gums normal  Neck:   Supple, symmetrical, trachea midline, no adenopathy;       thyroid:  No enlargement/tenderness/nodules; no carotid   bruit or JVD  Back:     Symmetric, no curvature, ROM normal, no CVA tenderness  Lungs:     Clear to auscultation bilaterally, respirations unlabored  Chest wall:    No tenderness or deformity  Heart:    Normal heart rate. Irregularly irregular rhythm.  2/6 holosystolic murmur at left lower sternal border S1 and S2 normal  Abdomen:     Soft, non-tender, bowel sounds active all four quadrants,    no masses, no organomegaly  Genitalia:    deferred  Rectal:    deferred  Extremities:    All extremities are intact. No cyanosis or edema  Pulses:   2+ and symmetric all extremities  Skin:   Skin color, texture, turgor normal, no rashes or lesions  Lymph nodes:   Cervical, supraclavicular, and axillary nodes normal  Neurologic:   CNII-XII intact. Normal strength, sensation and reflexes      throughout     Last depression screening scores PHQ 2/9 Scores 04/02/2020 12/28/2019 12/22/2018  PHQ - 2 Score 0 4 0  PHQ- 9 Score 0 12 -   Last fall risk screening Fall Risk  01/02/2020  Falls in the past year? -  Number falls in past yr: 0  Comment -  Injury with Fall? 1  Risk for fall due to : -  Follow up -   Last Audit-C alcohol use screening Alcohol Use Disorder Test (AUDIT) 12/28/2019  1. How often do you have a drink containing alcohol? 0  2. How many drinks containing alcohol do you have on a typical day when you are drinking? 0  3. How often do you have six or more drinks on one occasion? 0  AUDIT-C Score 0   A score of 3 or more in women, and 4 or more in men indicates increased risk for alcohol abuse, EXCEPT if all of the points are from question 1   No results found for any visits on 04/02/20.  Assessment & Plan    Routine Health Maintenance and Physical Exam  Exercise Activities and Dietary recommendations Goals    . diet     Recommend cutting down on portion size with daily meals.     Marland Kitchen DIET - INCREASE WATER INTAKE     Recommend increasing water intake to 6 glasses a day.     . Prevent falls     Recommend to remove any  items from the home that may cause slips or trips.       Immunization History  Administered Date(s) Administered  . Fluad Quad(high Dose 65+) 02/07/2019  . Influenza, High Dose Seasonal PF 06/17/2016, 04/08/2017, 02/21/2018  . PFIZER SARS-COV-2 Vaccination 07/21/2019, 08/15/2019  . PPD Test 12/03/2016  . Pneumococcal Conjugate-13 02/06/2014  . Pneumococcal Polysaccharide-23 03/19/2003  . Zoster 02/06/2014    Health Maintenance  Topic  Date Due  . INFLUENZA VACCINE  12/24/2019  . TETANUS/TDAP  05/25/2026 (Originally 06/23/1954)  . COVID-19 Vaccine  Completed  . PNA vac Low Risk Adult  Completed    Discussed health benefits of physical activity, and encouraged him to engage in regular exercise appropriate for his age and condition.  1. Essential hypertension Well controlled.  Continue current medications.    2. Coronary artery disease involving native coronary artery of native heart without angina pectoris Asymptomatic. Compliant with medication.  Continue aggressive risk factor modification.   - Lipid panel  3. Hypertensive pulmonary vascular disease (HCC)  - CBC - Comprehensive metabolic panel  4. Vitamin D deficiency  - VITAMIN D 25 Hydroxy (Vit-D Deficiency, Fractures)  5. Chronic pain syndrome Improved since vertebralplasty. He had been calling consistently for early refills for pain medication, but since limiting to 1 and 2 week supply a few months ago he has been doing better. He insists that he will not take medication any more than on prescription and will not call for early refills if we go back to 30 day supply. Counseled that if he does call for early refill for any reason we will need to go back to 1 week supply  6. Lumbar degenerative disc disease   7. Stage 3b chronic kidney disease (HCC)  - Comprehensive metabolic panel  8. Myalgia  - CK (Creatine Kinase) - Sed Rate (ESR) - TSH  9. Hyperuricemia  - Uric acid  10. Need for influenza vaccination  - Flu Vaccine QUAD High Dose IM (Fluad)      No follow-ups on file.     The entirety of the information documented in the History of Present Illness, Review of Systems and Physical Exam were personally obtained by me. Portions of this information were initially documented by the CMA and reviewed by me for thoroughness and accuracy.      Lelon Huh, MD  [facility] [phone] (phone) [phone] (fax)  [place]

## 2020-04-03 LAB — CBC
Hematocrit: 34.5 % — ABNORMAL LOW (ref 37.5–51.0)
Hemoglobin: 11.9 g/dL — ABNORMAL LOW (ref 13.0–17.7)
MCH: 34.2 pg — ABNORMAL HIGH (ref 26.6–33.0)
MCHC: 34.5 g/dL (ref 31.5–35.7)
MCV: 99 fL — ABNORMAL HIGH (ref 79–97)
Platelets: 298 10*3/uL (ref 150–450)
RBC: 3.48 x10E6/uL — ABNORMAL LOW (ref 4.14–5.80)
RDW: 15.3 % (ref 11.6–15.4)
WBC: 9.8 10*3/uL (ref 3.4–10.8)

## 2020-04-03 LAB — LIPID PANEL
Chol/HDL Ratio: 2.1 ratio (ref 0.0–5.0)
Cholesterol, Total: 170 mg/dL (ref 100–199)
HDL: 81 mg/dL (ref >39)
LDL Chol Calc (NIH): 73 mg/dL (ref 0–99)
Triglycerides: 89 mg/dL (ref 0–149)
VLDL Cholesterol Cal: 16 mg/dL (ref 5–40)

## 2020-04-03 LAB — COMPREHENSIVE METABOLIC PANEL
ALT: 13 IU/L (ref 0–44)
AST: 19 IU/L (ref 0–40)
Albumin/Globulin Ratio: 2.1 (ref 1.2–2.2)
Albumin: 4.9 g/dL — ABNORMAL HIGH (ref 3.6–4.6)
Alkaline Phosphatase: 63 IU/L (ref 44–121)
BUN/Creatinine Ratio: 25 — ABNORMAL HIGH (ref 10–24)
BUN: 48 mg/dL — ABNORMAL HIGH (ref 8–27)
Bilirubin Total: 0.4 mg/dL (ref 0.0–1.2)
CO2: 24 mmol/L (ref 20–29)
Calcium: 10.3 mg/dL — ABNORMAL HIGH (ref 8.6–10.2)
Chloride: 97 mmol/L (ref 96–106)
Creatinine, Ser: 1.9 mg/dL — ABNORMAL HIGH (ref 0.76–1.27)
GFR calc Af Amer: 37 mL/min/{1.73_m2} — ABNORMAL LOW (ref >59)
GFR calc non Af Amer: 32 mL/min/{1.73_m2} — ABNORMAL LOW (ref >59)
Globulin, Total: 2.3 g/dL (ref 1.5–4.5)
Glucose: 107 mg/dL — ABNORMAL HIGH (ref 65–99)
Potassium: 5.1 mmol/L (ref 3.5–5.2)
Sodium: 137 mmol/L (ref 134–144)
Total Protein: 7.2 g/dL (ref 6.0–8.5)

## 2020-04-03 LAB — SEDIMENTATION RATE: Sed Rate: 17 mm/hr (ref 0–30)

## 2020-04-03 LAB — URIC ACID: Uric Acid: 4.9 mg/dL (ref 3.8–8.4)

## 2020-04-03 LAB — TSH: TSH: 7.01 u[IU]/mL — ABNORMAL HIGH (ref 0.450–4.500)

## 2020-04-03 LAB — VITAMIN D 25 HYDROXY (VIT D DEFICIENCY, FRACTURES): Vit D, 25-Hydroxy: 53.8 ng/mL (ref 30.0–100.0)

## 2020-04-03 LAB — CK: Total CK: 52 U/L (ref 30–208)

## 2020-04-08 ENCOUNTER — Other Ambulatory Visit: Payer: Self-pay | Admitting: Family Medicine

## 2020-04-08 DIAGNOSIS — M5136 Other intervertebral disc degeneration, lumbar region: Secondary | ICD-10-CM

## 2020-04-08 MED ORDER — OXYCODONE HCL 15 MG PO TABS: 15.0000 mg | ORAL_TABLET | ORAL | 0 refills | Status: DC | PRN

## 2020-04-08 NOTE — Telephone Encounter (Signed)
Medication: oxyCODONE (ROXICODONE) 15 MG immediate release tablet [[id number]]   Has the patient contacted their pharmacy? YES  (Agent: If no, request that the patient contact the pharmacy for the refill.) (Agent: If yes, when and what did the pharmacy advise?)  Preferred Pharmacy (with phone number or street name): [facility] - [place] [address], [place] - [address] [place] [address] Phone: [phone] Fax: [phone] Hours: Not open 24 hours    Agent: Please be advised that RX refills may take up to 3 business days. We ask that you follow-up with your pharmacy.

## 2020-04-23 ENCOUNTER — Telehealth: Payer: Self-pay | Admitting: Family Medicine

## 2020-04-23 NOTE — Telephone Encounter (Signed)
Patient was advised earlier this month to put rosuvastatin on hold. Please see if he's leg pains have improved since stopping the rosuvastatin, if so we can change to a different cholesterol medication.

## 2020-04-24 NOTE — Telephone Encounter (Signed)
I called and spoke with patient. He reports that stopping the Rosuvastatin didn't do him any good. He is still having the same leg pains. Patient wants to know if you have another plan to help relieve the leg pain. He wants to know if he should resume taking the Rosuvastatin? Please advise.

## 2020-05-01 ENCOUNTER — Other Ambulatory Visit: Payer: Self-pay | Admitting: Family Medicine

## 2020-05-01 DIAGNOSIS — M5136 Other intervertebral disc degeneration, lumbar region: Secondary | ICD-10-CM

## 2020-05-01 NOTE — Telephone Encounter (Signed)
Patient advised.

## 2020-05-01 NOTE — Telephone Encounter (Signed)
Pt called to see if Dr. Caryn Section can send his refill for oxyCODONE (ROXICODONE) 15 MG immediate release tablet On Friday due to pharmacy not being open on Sunday when refill is due / please advise

## 2020-05-01 NOTE — Telephone Encounter (Signed)
Was last dispensed 28 day supply on 04-08-2020. Is not due for refill until Monday 12-13

## 2020-05-03 NOTE — Telephone Encounter (Signed)
Pt called stating that he did not receive his last prescription until 04/08/20. He states that he is due for a refill on 05/05/20 and is requesting to have that done early due to the pharmacy being closed. Please advise.

## 2020-05-04 MED ORDER — OXYCODONE HCL 15 MG PO TABS: 15.0000 mg | ORAL_TABLET | ORAL | 0 refills | Status: DC | PRN

## 2020-05-30 ENCOUNTER — Other Ambulatory Visit: Payer: Self-pay | Admitting: Family Medicine

## 2020-05-30 DIAGNOSIS — M5136 Other intervertebral disc degeneration, lumbar region: Secondary | ICD-10-CM

## 2020-05-30 NOTE — Telephone Encounter (Signed)
Requested medication (s) are due for refill today: no  Requested medication (s) are on the active medication list: yes  Last refill:  05/04/2020  Future visit scheduled: no  Notes to clinic: this refill cannot be delegated  Patient wanted to make sure he doesn't run out    Requested Prescriptions  Pending Prescriptions Disp Refills   oxyCODONE (ROXICODONE) 15 MG immediate release tablet 168 tablet 0    Sig: Take 1 tablet (15 mg total) by mouth every 4 (four) hours as needed. for pain      There is no refill protocol information for this order

## 2020-05-30 NOTE — Telephone Encounter (Signed)
Patient is experiencing leg pain in both legs that has increased over time since his last appointment.  Patient called to notify Dr. Sherrie Mustache of no improvement .

## 2020-05-30 NOTE — Telephone Encounter (Signed)
Copied from CRM [id number]. Topic: Quick Communication - Rx Refill/Question >> May 30, 2020  1:15 PM Gaetana Michaelis A wrote: Medication: oxyCODONE (ROXICODONE)   Has the patient contacted their pharmacy? No. Patient wanted to reach out to PCP before he ran out.  Preferred Pharmacy (with phone number or street name): [facility] - [place] - [address] Phone: [phone]  Agent: Please be advised that RX refills may take up to 3 business days. We ask that you follow-up with your pharmacy.

## 2020-05-31 MED ORDER — OXYCODONE HCL 15 MG PO TABS: 15.0000 mg | ORAL_TABLET | ORAL | 0 refills | Status: DC | PRN

## 2020-06-27 ENCOUNTER — Other Ambulatory Visit: Payer: Self-pay | Admitting: Family Medicine

## 2020-06-27 ENCOUNTER — Telehealth: Payer: Self-pay

## 2020-06-27 DIAGNOSIS — M5136 Other intervertebral disc degeneration, lumbar region: Secondary | ICD-10-CM

## 2020-06-27 MED ORDER — OXYCODONE HCL 15 MG PO TABS: 15.0000 mg | ORAL_TABLET | ORAL | 0 refills | Status: DC | PRN

## 2020-06-27 NOTE — Telephone Encounter (Signed)
Requested medication (s) are due for refill today: yes  Requested medication (s) are on the active medication list: yes  Last refill:  05/31/2020  Future visit scheduled: no  Notes to clinic: this refill cannot be delegated    Requested Prescriptions  Pending Prescriptions Disp Refills   oxyCODONE (ROXICODONE) 15 MG immediate release tablet 168 tablet 0    Sig: Take 1 tablet (15 mg total) by mouth every 4 (four) hours as needed. for pain      There is no refill protocol information for this order

## 2020-06-27 NOTE — Telephone Encounter (Signed)
Medication Refill - Medication: Oxycodone   Has the patient contacted their pharmacy? Yes.   (Agent: If no, request that the patient contact the pharmacy for the refill.) (Agent: If yes, when and what did the pharmacy advise?)  Preferred Pharmacy (with phone number or street name):[facility], [place]: Please be advised that RX refills may take up to 3 business days. We ask that you follow-up with your pharmacy.

## 2020-06-27 NOTE — Telephone Encounter (Signed)
Copied from [place] [phone] [id number]. Topic: General - Inquiry >> Jun 27, 2020 11:20 AM Gillis Ends D wrote: Reason for CRM: Patient called and wanted to let Dr. Caryn Section know that he is having the same pain in his legs and now it's in his feet. He is also requesting one more tablet of Oxycodone per day. Because of the problems in his feet. If he is unable to get the extra tablet he will be okay with the 6 tablets per day. He can be reached at [phone] cellphone home phone isn't working. Please advise

## 2020-07-19 NOTE — Telephone Encounter (Signed)
Patient calling to and wanted to let Dr. Caryn Section know that he is having the same pain in his legs and now it's in his feet. Pt declined appt to discuss with Dr. Caryn Section. Please advise CB- [phone]

## 2020-07-23 ENCOUNTER — Telehealth: Payer: Self-pay

## 2020-07-23 DIAGNOSIS — M5136 Other intervertebral disc degeneration, lumbar region: Secondary | ICD-10-CM

## 2020-07-23 NOTE — Telephone Encounter (Signed)
Copied from [place] [id number]. Topic: General - Inquiry >> Jul 23, 2020  9:52 AM Greggory Keen D wrote: Reason for CRM: Pt called to say his foot pain is worse he thinks he needs a stronger medication or either one more pill of the pain medication per day.    March 3 he will need a new refill on the Oxycodone 15 mg 6 x times a day  CB#  [phone]

## 2020-07-24 MED ORDER — OXYCODONE HCL 15 MG PO TABS: 15.0000 mg | ORAL_TABLET | ORAL | 0 refills | Status: DC | PRN

## 2020-07-24 NOTE — Telephone Encounter (Signed)
Refill has been sent to pharmacy and should be ready by tomorrow. He needs referral back to pain clinic if pain is not under control on current medications.

## 2020-07-24 NOTE — Telephone Encounter (Addendum)
I called and spoke with patient. He reports having pain in both legs and feet for the past 2-3 weeks. He says he had back surgery one month ago. He states his back pain has improved since the surgery, but has now moved into his legs and feet. Patient denies any swelling or redness. Patient reports that the pain is worse in the mornings, and improves throughout the day. He denies any injury. Patient has been taking Oxycodone and Gabapentin as prescribed, but the pain is still persistent. Patient is asking for something to help with leg and foot pain. Please advise.

## 2020-07-29 ENCOUNTER — Other Ambulatory Visit: Payer: Self-pay | Admitting: Family Medicine

## 2020-07-29 DIAGNOSIS — E7800 Pure hypercholesterolemia, unspecified: Secondary | ICD-10-CM

## 2020-07-29 DIAGNOSIS — M5136 Other intervertebral disc degeneration, lumbar region: Secondary | ICD-10-CM

## 2020-07-29 NOTE — Telephone Encounter (Signed)
Requested medication (s) are due for refill today: no  Requested medication (s) are on the active medication list: yes  Last refill:  07/24/2020  Future visit scheduled:  yes  Notes to clinic: this refill cannot be delegated   Requested Prescriptions  Pending Prescriptions Disp Refills   oxyCODONE (ROXICODONE) 15 MG immediate release tablet [Pharmacy Med Name: OXYCODONE HCL 15 MG TAB] 168 tablet     Sig: TAKE 1 TABLET BY MOUTH EVERY 4 HOURS AS NEEDED FOR PAIN      Not Delegated - Analgesics:  Opioid Agonists Failed - 07/29/2020 10:00 AM      Failed - This refill cannot be delegated      Failed - Urine Drug Screen completed in last 360 days      Failed - Valid encounter within last 6 months    Recent Outpatient Visits           3 months ago Annual physical exam   [facility] Birdie Sons, MD   9 months ago Essential hypertension   [facility] Birdie Sons, MD   1 year ago Uncomplicated opioid dependence [facility])   [facility] Birdie Sons, MD   1 year ago Essential hypertension   [facility] Birdie Sons, MD   1 year ago Internal hemorrhoid   [place], PA-C                 Signed Prescriptions Disp Refills   rosuvastatin (CRESTOR) 10 MG tablet 90 tablet 1    Sig: TAKE 1 TABLET BY MOUTH ONCE DAILY      Cardiovascular:  Antilipid - Statins Failed - 07/29/2020 10:00 AM      Failed - LDL in normal range and within 360 days    Ldl Cholesterol, Calc  Date Value Ref Range Status  09/01/2011 44 0 - 100 mg/dL Final   LDL Chol Calc (NIH)  Date Value Ref Range Status  04/02/2020 73 0 - 99 mg/dL Final          Passed - Total Cholesterol in normal range and within 360 days    Cholesterol, Total  Date Value Ref Range Status  04/02/2020 170 100 - 199 mg/dL Final   Cholesterol  Date Value Ref Range Status  09/01/2011 104 0 - 200 mg/dL Final          Passed -  HDL in normal range and within 360 days    HDL Cholesterol  Date Value Ref Range Status  09/01/2011 42 40 - 60 mg/dL Final   HDL  Date Value Ref Range Status  04/02/2020 81 >39 mg/dL Final          Passed - Triglycerides in normal range and within 360 days    Triglycerides  Date Value Ref Range Status  04/02/2020 89 0 - 149 mg/dL Final  09/01/2011 88 0 - 200 mg/dL Final          Passed - Patient is not pregnant      Passed - Valid encounter within last 12 months    Recent Outpatient Visits           3 months ago Annual physical exam   [facility] Birdie Sons, MD   9 months ago Essential hypertension   [facility] Birdie Sons, MD   1 year ago Uncomplicated opioid dependence [facility])   [facility] Birdie Sons, MD  1 year ago Essential hypertension   [place], Kirstie Peri, MD   1 year ago Internal hemorrhoid   [place], [place]

## 2020-07-30 NOTE — Telephone Encounter (Signed)
28 days supply dispensed 07-24-2020

## 2020-08-19 ENCOUNTER — Telehealth: Payer: Self-pay | Admitting: Family Medicine

## 2020-08-19 DIAGNOSIS — M5136 Other intervertebral disc degeneration, lumbar region: Secondary | ICD-10-CM

## 2020-08-19 NOTE — Telephone Encounter (Signed)
Medication Refill - Medication: oxycodone  Has the patient contacted their pharmacy?yes. Due to controlled substance pt is calling md office [facility] [place] [facility] [place] phone [phone]. Pt states he is given md office 3 day notice, rx is due on thursday Agent: Please be advised that RX refills may take up to 3 business days. We ask that you follow-up with your pharmacy.

## 2020-08-19 NOTE — Telephone Encounter (Signed)
Pt due for refill Thursday. PEC NT not delegated to refill.

## 2020-08-21 MED ORDER — OXYCODONE HCL 15 MG PO TABS: 15.0000 mg | ORAL_TABLET | ORAL | 0 refills | Status: DC | PRN

## 2020-08-28 DIAGNOSIS — H26491 Other secondary cataract, right eye: Secondary | ICD-10-CM | POA: Diagnosis not present

## 2020-09-04 DIAGNOSIS — H26491 Other secondary cataract, right eye: Secondary | ICD-10-CM | POA: Diagnosis not present

## 2020-09-16 ENCOUNTER — Telehealth: Payer: Self-pay

## 2020-09-16 DIAGNOSIS — M5136 Other intervertebral disc degeneration, lumbar region: Secondary | ICD-10-CM

## 2020-09-16 NOTE — Telephone Encounter (Signed)
Copied from [place] [phone]. Topic: General - Other >> Sep 16, 2020  3:13 PM Tessa Lerner A wrote: Reason for CRM: Patient has made contact to notify their PCP that they will be out of their oxyCODONE (ROXICODONE) prescription on 09/19/20  Please contact to advise further if needed

## 2020-09-18 MED ORDER — OXYCODONE HCL 15 MG PO TABS: 15.0000 mg | ORAL_TABLET | ORAL | 0 refills | Status: DC | PRN

## 2020-09-18 NOTE — Addendum Note (Signed)
Addended by: Birdie Sons on: 09/18/2020 08:21 AM   Modules accepted: Orders

## 2020-10-14 ENCOUNTER — Other Ambulatory Visit: Payer: Self-pay | Admitting: Family Medicine

## 2020-10-14 ENCOUNTER — Telehealth: Payer: Self-pay | Admitting: Family Medicine

## 2020-10-14 DIAGNOSIS — M5136 Other intervertebral disc degeneration, lumbar region: Secondary | ICD-10-CM

## 2020-10-14 NOTE — Telephone Encounter (Signed)
Copied from [place] [phone]. Topic: Quick Communication - Rx Refill/Question >> Oct 14, 2020 12:19 PM Tessa Lerner A wrote: Medication: oxyCODONE (ROXICODONE) 15 MG immediate release tablet   Has the patient contacted their pharmacy? No  Preferred Pharmacy (with phone number or street name): [place] [place]  Phone: [phone] Fax: [phone]  Agent: Please be advised that RX refills may take up to 3 business days. We ask that you follow-up with your pharmacy.

## 2020-10-14 NOTE — Telephone Encounter (Signed)
Requested medication (s) are due for refill today - yes  Requested medication (s) are on the active medication list -yes  Future visit scheduled -no  Last refill: 09/18/09  Notes to clinic: Request non delegated Rx  Requested Prescriptions  Pending Prescriptions Disp Refills   oxyCODONE (ROXICODONE) 15 MG immediate release tablet [Pharmacy Med Name: OXYCODONE HCL 15 MG TAB] 168 tablet     Sig: TAKE 1 TABLET BY MOUTH EVERY 4 HOURS AS NEEDED FOR PAIN      Not Delegated - Analgesics:  Opioid Agonists Failed - 10/14/2020 10:55 AM      Failed - This refill cannot be delegated      Failed - Urine Drug Screen completed in last 360 days      Failed - Valid encounter within last 6 months    Recent Outpatient Visits           6 months ago Annual physical exam   [facility] Birdie Sons, MD   11 months ago Essential hypertension   [facility] Birdie Sons, MD   1 year ago Uncomplicated opioid dependence [facility])   [facility] Birdie Sons, MD   1 year ago Essential hypertension   [facility] Birdie Sons, MD   1 year ago Internal hemorrhoid   [place], Vickki Muff, PA-C                    Requested Prescriptions  Pending Prescriptions Disp Refills   oxyCODONE (ROXICODONE) 15 MG immediate release tablet [Pharmacy Med Name: OXYCODONE HCL 15 MG TAB] 168 tablet     Sig: TAKE 1 TABLET BY MOUTH EVERY 4 HOURS AS NEEDED FOR PAIN      Not Delegated - Analgesics:  Opioid Agonists Failed - 10/14/2020 10:55 AM      Failed - This refill cannot be delegated      Failed - Urine Drug Screen completed in last 360 days      Failed - Valid encounter within last 6 months    Recent Outpatient Visits           6 months ago Annual physical exam   [facility] Birdie Sons, MD   11 months ago Essential hypertension   [facility] Birdie Sons, MD   1 year  ago Uncomplicated opioid dependence [facility])   [facility] Birdie Sons, MD   1 year ago Essential hypertension   [facility] Birdie Sons, MD   1 year ago Internal hemorrhoid   [place], Vickki Muff, [place]

## 2020-10-14 NOTE — Telephone Encounter (Signed)
Requested medication (s) are due for refill today:yes  Requested medication (s) are on the active medication list: yes   Last refill: 09/18/20  #168  0 refills  Future visit scheduled With nurse  01/02/21  Notes to clinic: not delegated  Requested Prescriptions  Pending Prescriptions Disp Refills   oxyCODONE (ROXICODONE) 15 MG immediate release tablet 168 tablet 0    Sig: Take 1 tablet (15 mg total) by mouth every 4 (four) hours as needed. for pain      Not Delegated - Analgesics:  Opioid Agonists Failed - 10/14/2020 12:24 PM      Failed - This refill cannot be delegated      Failed - Urine Drug Screen completed in last 360 days      Failed - Valid encounter within last 6 months    Recent Outpatient Visits           6 months ago Annual physical exam   [facility] Birdie Sons, MD   11 months ago Essential hypertension   [facility] Birdie Sons, MD   1 year ago Uncomplicated opioid dependence [facility])   [facility] Birdie Sons, MD   1 year ago Essential hypertension   [facility] Birdie Sons, MD   1 year ago Internal hemorrhoid   [place], Vickki Muff, [place]

## 2020-11-05 ENCOUNTER — Other Ambulatory Visit: Payer: Self-pay | Admitting: Family Medicine

## 2020-11-05 DIAGNOSIS — E7800 Pure hypercholesterolemia, unspecified: Secondary | ICD-10-CM

## 2020-11-11 ENCOUNTER — Other Ambulatory Visit: Payer: Self-pay | Admitting: Family Medicine

## 2020-11-11 DIAGNOSIS — M5136 Other intervertebral disc degeneration, lumbar region: Secondary | ICD-10-CM

## 2020-11-11 NOTE — Telephone Encounter (Signed)
Medication Refill - Medication: oxyCODONE (ROXICODONE) 15 MG immediate release tablet   Pt runs out of his supply tomorrow   Has the patient contacted their pharmacy? No. (Agent: If no, request that the patient contact the pharmacy for the refill.) (Agent: If yes, when and what did the pharmacy advise?)  Preferred Pharmacy (with phone number or street name):  [place] [place] [facility] [address]  [place] [address]  Phone: [phone] Fax: [phone]    Agent: Please be advised that RX refills may take up to 3 business days. We ask that you follow-up with your pharmacy.

## 2020-11-11 NOTE — Telephone Encounter (Signed)
Requested medication (s) are due for refill today:yes   Requested medication (s) are on the active medication list: yes   Last refill: 10/15/2020  Future visit scheduled: no   Notes to clinic: this refill cannot be delegated Patient will run out tomorrow    Requested Prescriptions  Pending Prescriptions Disp Refills   oxyCODONE (ROXICODONE) 15 MG immediate release tablet 168 tablet 0    Sig: Take 1 tablet (15 mg total) by mouth every 4 (four) hours as needed. for pain      There is no refill protocol information for this order

## 2020-11-12 ENCOUNTER — Other Ambulatory Visit: Payer: Self-pay | Admitting: Family Medicine

## 2020-11-12 DIAGNOSIS — M5136 Other intervertebral disc degeneration, lumbar region: Secondary | ICD-10-CM

## 2020-11-12 NOTE — Telephone Encounter (Signed)
Requested medication (s) are due for refill today: yes   Requested medication (s) are on the active medication list: yes   Last refill:  10/16/2020  Future visit scheduled:no  Notes to clinic: this refill cannot be delegated    Requested Prescriptions  Pending Prescriptions Disp Refills   oxyCODONE (ROXICODONE) 15 MG immediate release tablet [Pharmacy Med Name: OXYCODONE HCL 15 MG TAB] 168 tablet     Sig: TAKE 1 TABLET BY MOUTH EVERY 4 HOURS AS NEEDED FOR PAIN      Not Delegated - Analgesics:  Opioid Agonists Failed - 11/12/2020  1:24 PM      Failed - This refill cannot be delegated      Failed - Urine Drug Screen completed in last 360 days      Failed - Valid encounter within last 6 months    Recent Outpatient Visits           7 months ago Annual physical exam   [facility] Birdie Sons, MD   1 year ago Essential hypertension   [facility] Birdie Sons, MD   1 year ago Uncomplicated opioid dependence [facility])   [facility] Birdie Sons, MD   1 year ago Essential hypertension   [facility] Birdie Sons, MD   1 year ago Internal hemorrhoid   [place], Vickki Muff, [place]

## 2020-11-13 MED ORDER — OXYCODONE HCL 15 MG PO TABS: 15.0000 mg | ORAL_TABLET | ORAL | 0 refills | Status: DC | PRN

## 2020-11-20 ENCOUNTER — Ambulatory Visit: Payer: Self-pay | Admitting: *Deleted

## 2020-11-20 NOTE — Telephone Encounter (Signed)
Pt reports "Bright red blood in phlegm last 2-3 days." States "No cough, just when I get up phlegm there's blood in it, 2-3 times a day." Unable to verbalize amount, "Just mixed up in it." Denies SOB, no CP. Denies fever. States "I feel fine, just this bit of blood." States phlegm is clear "Greenish."  After hours call. Per protocol appt within 24 hrs. No availability. Assured pt NT would route to practice for PCPs review and final disposition. Advised ED for worsening symptoms, CP, SOB. Pt verbalizes understanding. CB# [phone]

## 2020-11-20 NOTE — Telephone Encounter (Signed)
Reason for Disposition  Coughing up rusty-colored sputum  Answer Assessment - Initial Assessment Questions 1. ONSET: "When did the cough begin?"      2-3 days 2. SEVERITY: "How bad is the cough today?" "Did the blood appear after a coughing spell?"      No cough, phlegm. 3. SPUTUM: "Describe the color of your sputum" (none, dry cough; clear, white, yellow, green)     Bright red mixed in, phlegm greenish. 4. HEMOPTYSIS: "How much blood?" (flecks, streaks, tablespoons, etc.)     Mixed in 5. DIFFICULTY BREATHING: "Are you having difficulty breathing?" If Yes, ask: "How bad is it?" (e.g., mild, moderate, severe)    - MILD: No SOB at rest, mild SOB with walking, speaks normally in sentences, can lie down, no retractions, pulse < 100.    - MODERATE: SOB at rest, SOB with minimal exertion and prefers to sit, cannot lie down flat, speaks in phrases, mild retractions, audible wheezing, pulse 100-120.    - SEVERE: Very SOB at rest, speaks in single words, struggling to breathe, sitting hunched forward, retractions, pulse > 120      no 6. FEVER: "Do you have a fever?" If Yes, ask: "What is your temperature, how was it measured, and when did it start?"     no 7. CARDIAC HISTORY: "Do you have any history of heart disease?" (e.g., heart attack, congestive heart failure)       8. LUNG HISTORY: "Do you have any history of lung disease?"  (e.g., pulmonary embolus, asthma, emphysema)     *No Answer* 9. PE RISK FACTORS: "Do you have a history of blood clots?" (or: recent major surgery, recent prolonged travel, bedridden)     *No Answer* 10. OTHER SYMPTOMS: "Do you have any other symptoms?" (e.g., runny nose, wheezing, chest pain) Wheezing on occasion, not often  Protocols used: Coughing Up Blood-A-AH

## 2020-11-22 NOTE — Telephone Encounter (Signed)
Please review for Dr. Fisher.   Thanks,   -Khyleigh Furney  

## 2020-11-22 NOTE — Telephone Encounter (Signed)
Pt. Called 11/20/20 and has not heard back from the practice. Sometimes sees "drops of bright red blood in mucus." No other symptoms. Please advise pt. In regard to an appointment.Contact number [phone].

## 2020-11-22 NOTE — Telephone Encounter (Signed)
Recommedn eval at [facility] if still having hemoptysis

## 2020-11-22 NOTE — Telephone Encounter (Signed)
Tried calling patient, no answer. Unable to leave message due to mailbox being full. Will try again later. Ok for [facility] to advise as below.

## 2020-11-26 NOTE — Telephone Encounter (Signed)
Patient advised to go to urgent care. Patient declined to go, does want to see Dr. Caryn Section. Patient reports symptom is getting better, but did go ahead and schedule for 11/29/20.

## 2020-11-29 ENCOUNTER — Encounter: Payer: Self-pay | Admitting: Family Medicine

## 2020-11-29 ENCOUNTER — Ambulatory Visit
Admission: RE | Admit: 2020-11-29 | Discharge: 2020-11-29 | Disposition: A | Discharge status: Home / Self Care | Payer: Medicare Other | Source: Ambulatory Visit | Attending: Family Medicine | Admitting: Family Medicine

## 2020-11-29 ENCOUNTER — Ambulatory Visit
Admission: RE | Admit: 2020-11-29 | Discharge: 2020-11-29 | Disposition: A | Discharge status: Home / Self Care | Payer: Medicare Other | Attending: Family Medicine | Admitting: Family Medicine

## 2020-11-29 ENCOUNTER — Other Ambulatory Visit: Payer: Self-pay

## 2020-11-29 ENCOUNTER — Ambulatory Visit (INDEPENDENT_AMBULATORY_CARE_PROVIDER_SITE_OTHER): Payer: Medicare Other | Admitting: Family Medicine

## 2020-11-29 VITALS — BP 124/78 | HR 81 | Temp 97.9°F | Resp 22 | Wt 178.6 lb

## 2020-11-29 DIAGNOSIS — R042 Hemoptysis: Secondary | ICD-10-CM

## 2020-11-29 DIAGNOSIS — M5136 Other intervertebral disc degeneration, lumbar region: Secondary | ICD-10-CM | POA: Diagnosis not present

## 2020-11-29 DIAGNOSIS — I517 Cardiomegaly: Secondary | ICD-10-CM | POA: Diagnosis not present

## 2020-11-29 DIAGNOSIS — J439 Emphysema, unspecified: Secondary | ICD-10-CM | POA: Diagnosis not present

## 2020-11-29 MED ORDER — OXYCODONE HCL 15 MG PO TABS: 15.0000 mg | ORAL_TABLET | ORAL | 0 refills | Status: DC | PRN

## 2020-11-29 NOTE — Progress Notes (Signed)
Established patient visit   Patient: Vincent Mason   DOB: 1936-03-09   85 y.o. Male  MRN: [id number] Visit Date: 11/29/2020  Today's healthcare provider: Lelon Huh, MD   Chief Complaint  Patient presents with   Blood in sputum   Subjective    HPI  He reports that for the last couple of weeks he has been coughing up greenish phlegm  and blood in the morning. No fevers. No throat pain. No chest pains, no dyspnea. Otherwise not coughing persistently.       Medications: Outpatient Medications Prior to Visit  Medication Sig   allopurinol (ZYLOPRIM) 100 MG tablet Take 2 tablets (200 mg total) by mouth daily.   apixaban (ELIQUIS) 5 MG TABS tablet Take 1/2 tablet twice a day (Patient taking differently: Take 5 mg by mouth 2 (two) times daily.)   Cholecalciferol (VITAMIN D-3) 125 MCG (5000 UT) TABS Take 5,000 Units by mouth daily.   Cholecalciferol (VITAMIN D3) 1000 units CAPS Take 2,000 Units by mouth daily.    ferrous sulfate 325 (65 FE) MG tablet Take 1 tablet (325 mg total) by mouth daily.   gabapentin (NEURONTIN) 300 MG capsule Take 1 capsule (300 mg total) by mouth 2 (two) times daily.   metoprolol succinate (TOPROL-XL) 25 MG 24 hr tablet TAKE 1/2 A TABLET BY MOUTH DAILY (Patient taking differently: Take 12.5 mg by mouth daily.)   oxyCODONE (ROXICODONE) 15 MG immediate release tablet Take 1 tablet (15 mg total) by mouth every 4 (four) hours as needed. for pain   polyethylene glycol (MIRALAX / GLYCOLAX) packet Take 17 g by mouth daily. (Patient taking differently: Take 17 g by mouth daily as needed for mild constipation.)   PROCTOZONE-HC 2.5 % rectal cream PLACE 1 APPLICATION RECTALLY TWICE DAILYAS DIRECTED (Patient taking differently: Place 1 application rectally 2 (two) times daily as needed (discomfort/hemorrhoids).)   rosuvastatin (CRESTOR) 10 MG tablet TAKE 1 TABLET BY MOUTH ONCE DAILY   spironolactone (ALDACTONE) 25 MG tablet Take 1 tablet by mouth daily.   torsemide  (DEMADEX) 20 MG tablet Take one daily as needed for excessive swelling (Patient taking differently: Take 20 mg by mouth daily.)   No facility-administered medications prior to visit.    Review of Systems     Objective    BP 124/78 (BP Location: Right Arm, Patient Position: Sitting, Cuff Size: Normal)   Pulse 81   Temp 97.9 F (36.6 C) (Temporal)   Resp (!) 22   Wt 178 lb 9.6 oz (81 kg)   SpO2 98% Comment: room air  BMI 25.63 kg/m     Physical Exam   General: Appearance:     Overweight male in no acute distress. Well appearing  ENT:  OP pink and clear  Eyes:    PERRL, conjunctiva/corneas clear, EOM's intact       Lungs:     Clear to auscultation bilaterally, respirations unlabored  Heart:    Normal heart rate. Irregularly irregular rhythm.  2/6 holosystolic murmur at left lower sternal border  MS:   All extremities are intact.   Neurologic:   Awake, alert, oriented x 3. No apparent focal neurological           defect.         Assessment & Plan     1. Hemoptysis Does not appear acutely ill. He is coughing up a bit of phlegm with blood in morning. May be overly anticoagulated as well.  - Comprehensive metabolic  panel - CBC - PT and PTT - DG Chest 2 View; Future  Consider antibiotic. Is to reduce Eliquis to 1/2 BID for now.   2. Degenerative disc disease, lumbar He states today that 1/2 of his most recent prescription was in his pant pocket which was thrown in the clothes washer.  - oxyCODONE (ROXICODONE) 15 MG immediate release tablet; Take 1 tablet (15 mg total) by mouth every 4 (four) hours as needed. for pain  Dispense: 84 tablet; Refill: 0        The entirety of the information documented in the History of Present Illness, Review of Systems and Physical Exam were personally obtained by me. Portions of this information were initially documented by the CMA and reviewed by me for thoroughness and accuracy.     Lelon Huh, MD  [facility] [phone] (phone) [phone] (fax)  [place]

## 2020-11-29 NOTE — Patient Instructions (Signed)
Please go to the lab draw station in [address] on the second floor of [place] to the [facility] on [address] for chest Xray   Reduce Eliquis to 1/2 tablet twice a day until further notice.

## 2020-11-29 NOTE — Progress Notes (Deleted)
Established patient visit   Patient: Vincent Mason   DOB: 1936/05/06   85 y.o. Male  MRN: [id number] Visit Date: 11/29/2020  Today's healthcare provider: Lelon Huh, MD   Chief Complaint  Patient presents with   Blood in sputum   Subjective    HPI  Blood in sputum: Patient reports having reddish brown colored blood in phlegm for the last 12 days. He denies any cough. This occurs usually 2-3 times a day." Unable to verbalize amount, "Just mixed up in it." Denies SOB, or chest pain. Denies fever. States "I feel fine, just this bit of blood." States phlegm is clear "Greenish."  {Show patient history (optional):23778}   Medications: Outpatient Medications Prior to Visit  Medication Sig   allopurinol (ZYLOPRIM) 100 MG tablet Take 2 tablets (200 mg total) by mouth daily.   apixaban (ELIQUIS) 5 MG TABS tablet Take 1/2 tablet twice a day (Patient taking differently: Take 5 mg by mouth 2 (two) times daily.)   Cholecalciferol (VITAMIN D-3) 125 MCG (5000 UT) TABS Take 5,000 Units by mouth daily.   Cholecalciferol (VITAMIN D3) 1000 units CAPS Take 2,000 Units by mouth daily.    ferrous sulfate 325 (65 FE) MG tablet Take 1 tablet (325 mg total) by mouth daily.   gabapentin (NEURONTIN) 300 MG capsule Take 1 capsule (300 mg total) by mouth 2 (two) times daily.   metoprolol succinate (TOPROL-XL) 25 MG 24 hr tablet TAKE 1/2 A TABLET BY MOUTH DAILY (Patient taking differently: Take 12.5 mg by mouth daily.)   oxyCODONE (ROXICODONE) 15 MG immediate release tablet Take 1 tablet (15 mg total) by mouth every 4 (four) hours as needed. for pain   polyethylene glycol (MIRALAX / GLYCOLAX) packet Take 17 g by mouth daily. (Patient taking differently: Take 17 g by mouth daily as needed for mild constipation.)   PROCTOZONE-HC 2.5 % rectal cream PLACE 1 APPLICATION RECTALLY TWICE DAILYAS DIRECTED (Patient taking differently: Place 1 application rectally 2 (two) times daily as needed  (discomfort/hemorrhoids).)   rosuvastatin (CRESTOR) 10 MG tablet TAKE 1 TABLET BY MOUTH ONCE DAILY   spironolactone (ALDACTONE) 25 MG tablet Take 1 tablet by mouth daily.   torsemide (DEMADEX) 20 MG tablet Take one daily as needed for excessive swelling (Patient taking differently: Take 20 mg by mouth daily.)   No facility-administered medications prior to visit.    Review of Systems  Constitutional:  Negative for appetite change, chills and fever.  HENT:  Negative for congestion.        Mucus production  Respiratory:  Positive for shortness of breath. Negative for cough, chest tightness and wheezing.   Cardiovascular:  Negative for chest pain and palpitations.  Gastrointestinal:  Negative for abdominal pain, nausea and vomiting.   {Labs  Heme  Chem  Endocrine  Serology  Results Review (optional):23779}   Objective    BP 124/78 (BP Location: Right Arm, Patient Position: Sitting, Cuff Size: Normal)   Pulse 81   Temp 97.9 F (36.6 C) (Temporal)   Resp (!) 22   Wt 178 lb 9.6 oz (81 kg)   SpO2 98% Comment: room air  BMI 25.63 kg/m  {Show previous vital signs (optional):23777}   Physical Exam  ***  No results found for any visits on 11/29/20.  Assessment & Plan     ***  No follow-ups on file.      {provider attestation***:1}   Lelon Huh, MD  [facility] [phone] (phone) [phone] (fax)  [facility]

## 2020-11-30 LAB — COMPREHENSIVE METABOLIC PANEL
ALT: 8 IU/L (ref 0–44)
AST: 17 IU/L (ref 0–40)
Albumin/Globulin Ratio: 2.1 (ref 1.2–2.2)
Albumin: 4.6 g/dL (ref 3.6–4.6)
Alkaline Phosphatase: 58 IU/L (ref 44–121)
BUN/Creatinine Ratio: 17 (ref 10–24)
BUN: 35 mg/dL — ABNORMAL HIGH (ref 8–27)
Bilirubin Total: 0.7 mg/dL (ref 0.0–1.2)
CO2: 20 mmol/L (ref 20–29)
Calcium: 9.3 mg/dL (ref 8.6–10.2)
Chloride: 102 mmol/L (ref 96–106)
Creatinine, Ser: 2.01 mg/dL — ABNORMAL HIGH (ref 0.76–1.27)
Globulin, Total: 2.2 g/dL (ref 1.5–4.5)
Glucose: 114 mg/dL — ABNORMAL HIGH (ref 65–99)
Potassium: 4.2 mmol/L (ref 3.5–5.2)
Sodium: 141 mmol/L (ref 134–144)
Total Protein: 6.8 g/dL (ref 6.0–8.5)
eGFR: 32 mL/min/{1.73_m2} — ABNORMAL LOW (ref >59)

## 2020-11-30 LAB — PT AND PTT
INR: 1 (ref 0.9–1.2)
Prothrombin Time: 10.7 s (ref 9.1–12.0)
aPTT: 26 s (ref 24–33)

## 2020-11-30 LAB — CBC
Hematocrit: 22.7 % — ABNORMAL LOW (ref 37.5–51.0)
Hemoglobin: 7.9 g/dL — ABNORMAL LOW (ref 13.0–17.7)
MCH: 36.2 pg — ABNORMAL HIGH (ref 26.6–33.0)
MCHC: 34.8 g/dL (ref 31.5–35.7)
MCV: 104 fL — ABNORMAL HIGH (ref 79–97)
NRBC: 1 % — ABNORMAL HIGH (ref 0–0)
Platelets: 96 10*3/uL — CL (ref 150–450)
RBC: 2.18 x10E6/uL — CL (ref 4.14–5.80)
RDW: 17.9 % — ABNORMAL HIGH (ref 11.6–15.4)
WBC: 2.1 10*3/uL — CL (ref 3.4–10.8)

## 2020-12-09 ENCOUNTER — Other Ambulatory Visit: Payer: Self-pay

## 2020-12-09 DIAGNOSIS — M5136 Other intervertebral disc degeneration, lumbar region: Secondary | ICD-10-CM

## 2020-12-09 NOTE — Telephone Encounter (Signed)
Please review. Thanks!  

## 2020-12-09 NOTE — Telephone Encounter (Signed)
Copied from [place] [phone] [phone]. Topic: General - Inquiry >> Dec 09, 2020  2:31 PM [place] wrote: oxyCODONE (ROXICODONE) 15 MG immediate release tablet 84 tablet 0 11/29/2020   Sig - Route: Take 1 tablet (15 mg total) by mouth every 4 (four) hours as needed. for pain - Oral  Class: Print  Earliest Fill Date: 11/29/2020 Pt says calling in 2 days ahead as requested [place], [place] [facility] [place] [address] [place] [address] Phone: [phone] Fax: [phone]

## 2020-12-10 ENCOUNTER — Telehealth: Payer: Self-pay | Admitting: Family Medicine

## 2020-12-10 DIAGNOSIS — R918 Other nonspecific abnormal finding of lung field: Secondary | ICD-10-CM

## 2020-12-10 DIAGNOSIS — D509 Iron deficiency anemia, unspecified: Secondary | ICD-10-CM

## 2020-12-10 NOTE — Telephone Encounter (Signed)
Refill due 12-13-2020

## 2020-12-10 NOTE — Telephone Encounter (Signed)
Please advise it is time to recheck labs (CBC, iron studies and renal functions) due to low blood count on labs done 11/29/2020. He should also be getting call to schedule lung CT since there a 1cm lung nodule on his chest xray.  Please print and leave order at lab.

## 2020-12-10 NOTE — Telephone Encounter (Signed)
Pt advised.  He is going to come by one day this week to get his lab work done.   Thanks,   -Mickel Baas

## 2020-12-11 ENCOUNTER — Other Ambulatory Visit: Payer: Self-pay | Admitting: Family Medicine

## 2020-12-11 DIAGNOSIS — M5136 Other intervertebral disc degeneration, lumbar region: Secondary | ICD-10-CM

## 2020-12-14 ENCOUNTER — Inpatient Hospital Stay
Admission: EM | Admit: 2020-12-14 | Discharge: 2020-12-17 | DRG: 811 | Disposition: A | Discharge status: Home / Self Care | Payer: Medicare Other | Attending: Internal Medicine | Admitting: Internal Medicine

## 2020-12-14 ENCOUNTER — Encounter: Payer: Self-pay | Admitting: Emergency Medicine

## 2020-12-14 ENCOUNTER — Emergency Department: Payer: Medicare Other

## 2020-12-14 ENCOUNTER — Other Ambulatory Visit: Payer: Self-pay

## 2020-12-14 ENCOUNTER — Observation Stay: Payer: Medicare Other

## 2020-12-14 DIAGNOSIS — Z951 Presence of aortocoronary bypass graft: Secondary | ICD-10-CM

## 2020-12-14 DIAGNOSIS — Z8249 Family history of ischemic heart disease and other diseases of the circulatory system: Secondary | ICD-10-CM | POA: Diagnosis not present

## 2020-12-14 DIAGNOSIS — G2581 Restless legs syndrome: Secondary | ICD-10-CM | POA: Diagnosis not present

## 2020-12-14 DIAGNOSIS — Z743 Need for continuous supervision: Secondary | ICD-10-CM | POA: Diagnosis not present

## 2020-12-14 DIAGNOSIS — N183 Chronic kidney disease, stage 3 unspecified: Secondary | ICD-10-CM | POA: Diagnosis present

## 2020-12-14 DIAGNOSIS — E538 Deficiency of other specified B group vitamins: Secondary | ICD-10-CM | POA: Diagnosis not present

## 2020-12-14 DIAGNOSIS — D61818 Other pancytopenia: Secondary | ICD-10-CM | POA: Diagnosis not present

## 2020-12-14 DIAGNOSIS — I2510 Atherosclerotic heart disease of native coronary artery without angina pectoris: Secondary | ICD-10-CM | POA: Diagnosis present

## 2020-12-14 DIAGNOSIS — K7689 Other specified diseases of liver: Secondary | ICD-10-CM | POA: Diagnosis not present

## 2020-12-14 DIAGNOSIS — I482 Chronic atrial fibrillation, unspecified: Secondary | ICD-10-CM | POA: Diagnosis not present

## 2020-12-14 DIAGNOSIS — N1832 Chronic kidney disease, stage 3b: Secondary | ICD-10-CM | POA: Diagnosis present

## 2020-12-14 DIAGNOSIS — E860 Dehydration: Secondary | ICD-10-CM | POA: Diagnosis not present

## 2020-12-14 DIAGNOSIS — I272 Pulmonary hypertension, unspecified: Secondary | ICD-10-CM | POA: Diagnosis not present

## 2020-12-14 DIAGNOSIS — R042 Hemoptysis: Secondary | ICD-10-CM | POA: Diagnosis not present

## 2020-12-14 DIAGNOSIS — Z79899 Other long term (current) drug therapy: Secondary | ICD-10-CM | POA: Diagnosis not present

## 2020-12-14 DIAGNOSIS — G4733 Obstructive sleep apnea (adult) (pediatric): Secondary | ICD-10-CM | POA: Diagnosis present

## 2020-12-14 DIAGNOSIS — E785 Hyperlipidemia, unspecified: Secondary | ICD-10-CM | POA: Diagnosis not present

## 2020-12-14 DIAGNOSIS — K7460 Unspecified cirrhosis of liver: Secondary | ICD-10-CM | POA: Diagnosis not present

## 2020-12-14 DIAGNOSIS — D469 Myelodysplastic syndrome, unspecified: Principal | ICD-10-CM | POA: Diagnosis present

## 2020-12-14 DIAGNOSIS — I5023 Acute on chronic systolic (congestive) heart failure: Secondary | ICD-10-CM | POA: Diagnosis present

## 2020-12-14 DIAGNOSIS — I130 Hypertensive heart and chronic kidney disease with heart failure and stage 1 through stage 4 chronic kidney disease, or unspecified chronic kidney disease: Secondary | ICD-10-CM | POA: Diagnosis present

## 2020-12-14 DIAGNOSIS — Z87891 Personal history of nicotine dependence: Secondary | ICD-10-CM | POA: Diagnosis not present

## 2020-12-14 DIAGNOSIS — Z7901 Long term (current) use of anticoagulants: Secondary | ICD-10-CM

## 2020-12-14 DIAGNOSIS — I10 Essential (primary) hypertension: Secondary | ICD-10-CM | POA: Diagnosis present

## 2020-12-14 DIAGNOSIS — Z20822 Contact with and (suspected) exposure to covid-19: Secondary | ICD-10-CM | POA: Diagnosis present

## 2020-12-14 DIAGNOSIS — J449 Chronic obstructive pulmonary disease, unspecified: Secondary | ICD-10-CM | POA: Diagnosis not present

## 2020-12-14 DIAGNOSIS — I499 Cardiac arrhythmia, unspecified: Secondary | ICD-10-CM | POA: Diagnosis not present

## 2020-12-14 DIAGNOSIS — R0902 Hypoxemia: Secondary | ICD-10-CM | POA: Diagnosis not present

## 2020-12-14 DIAGNOSIS — J479 Bronchiectasis, uncomplicated: Secondary | ICD-10-CM | POA: Diagnosis not present

## 2020-12-14 DIAGNOSIS — I700 Atherosclerosis of aorta: Secondary | ICD-10-CM | POA: Diagnosis not present

## 2020-12-14 DIAGNOSIS — G894 Chronic pain syndrome: Secondary | ICD-10-CM | POA: Diagnosis present

## 2020-12-14 DIAGNOSIS — R58 Hemorrhage, not elsewhere classified: Secondary | ICD-10-CM | POA: Diagnosis not present

## 2020-12-14 LAB — COMPREHENSIVE METABOLIC PANEL
ALT: 11 U/L (ref 0–44)
AST: 17 U/L (ref 15–41)
Albumin: 3.7 g/dL (ref 3.5–5.0)
Alkaline Phosphatase: 45 U/L (ref 38–126)
Anion gap: 10 (ref 5–15)
BUN: 40 mg/dL — ABNORMAL HIGH (ref 8–23)
CO2: 23 mmol/L (ref 22–32)
Calcium: 9 mg/dL (ref 8.9–10.3)
Chloride: 106 mmol/L (ref 98–111)
Creatinine, Ser: 2.19 mg/dL — ABNORMAL HIGH (ref 0.61–1.24)
GFR, Estimated: 29 mL/min — ABNORMAL LOW (ref >60)
Glucose, Bld: 125 mg/dL — ABNORMAL HIGH (ref 70–99)
Potassium: 4.3 mmol/L (ref 3.5–5.1)
Sodium: 139 mmol/L (ref 135–145)
Total Bilirubin: 0.9 mg/dL (ref 0.3–1.2)
Total Protein: 6.7 g/dL (ref 6.5–8.1)

## 2020-12-14 LAB — RESP PANEL BY RT-PCR (FLU A&B, COVID) ARPGX2
Influenza A by PCR: NEGATIVE
Influenza B by PCR: NEGATIVE
SARS Coronavirus 2 by RT PCR: NEGATIVE

## 2020-12-14 LAB — RETICULOCYTES
Immature Retic Fract: 35.6 % — ABNORMAL HIGH (ref 2.3–15.9)
RBC.: 1.67 MIL/uL — ABNORMAL LOW (ref 4.22–5.81)
Retic Count, Absolute: 26.9 10*3/uL (ref 19.0–186.0)
Retic Ct Pct: 1.6 % (ref 0.4–3.1)

## 2020-12-14 LAB — FOLATE: Folate: 11.2 ng/mL (ref >5.9)

## 2020-12-14 LAB — CBC
HCT: 17.8 % — ABNORMAL LOW (ref 39.0–52.0)
Hemoglobin: 6 g/dL — ABNORMAL LOW (ref 13.0–17.0)
MCH: 37.3 pg — ABNORMAL HIGH (ref 26.0–34.0)
MCHC: 33.7 g/dL (ref 30.0–36.0)
MCV: 110.6 fL — ABNORMAL HIGH (ref 80.0–100.0)
Platelets: 48 10*3/uL — ABNORMAL LOW (ref 150–400)
RBC: 1.61 MIL/uL — ABNORMAL LOW (ref 4.22–5.81)
RDW: 18.9 % — ABNORMAL HIGH (ref 11.5–15.5)
WBC: 1.9 10*3/uL — ABNORMAL LOW (ref 4.0–10.5)
nRBC: 1.6 % — ABNORMAL HIGH (ref 0.0–0.2)

## 2020-12-14 LAB — BRAIN NATRIURETIC PEPTIDE: B Natriuretic Peptide: 100.4 pg/mL — ABNORMAL HIGH (ref 0.0–100.0)

## 2020-12-14 LAB — IRON AND TIBC
Iron: 93 ug/dL (ref 45–182)
Saturation Ratios: 45 % — ABNORMAL HIGH (ref 17.9–39.5)
TIBC: 207 ug/dL — ABNORMAL LOW (ref 250–450)
UIBC: 114 ug/dL

## 2020-12-14 LAB — PREPARE RBC (CROSSMATCH)

## 2020-12-14 LAB — LACTATE DEHYDROGENASE: LDH: 203 U/L — ABNORMAL HIGH (ref 98–192)

## 2020-12-14 LAB — PROTIME-INR
INR: 1.2 (ref 0.8–1.2)
Prothrombin Time: 14.8 seconds (ref 11.4–15.2)

## 2020-12-14 LAB — APTT: aPTT: 20 seconds — ABNORMAL LOW (ref 24–36)

## 2020-12-14 LAB — PROCALCITONIN: Procalcitonin: 0.12 ng/mL

## 2020-12-14 MED ORDER — METOPROLOL SUCCINATE ER 25 MG PO TB24
12.5000 mg | ORAL_TABLET | Freq: Every day | ORAL | Status: DC
  Administered 2020-12-14 – 2020-12-17 (×4): 12.5 mg via ORAL
  Filled 2020-12-14 (×2): qty 1
  Filled 2020-12-14 (×2): qty 0.5

## 2020-12-14 MED ORDER — POLYETHYLENE GLYCOL 3350 17 G PO PACK: 17.0000 g | PACK | Freq: Every day | ORAL | Status: DC | PRN

## 2020-12-14 MED ORDER — ROSUVASTATIN CALCIUM 10 MG PO TABS
10.0000 mg | ORAL_TABLET | Freq: Every day | ORAL | Status: DC
  Administered 2020-12-14 – 2020-12-17 (×4): 10 mg via ORAL
  Filled 2020-12-14 (×4): qty 1

## 2020-12-14 MED ORDER — VITAMIN D3 25 MCG (1000 UT) PO CAPS: 2000.0000 [IU] | ORAL_CAPSULE | Freq: Every day | ORAL | Status: DC

## 2020-12-14 MED ORDER — SPIRONOLACTONE 25 MG PO TABS
25.0000 mg | ORAL_TABLET | Freq: Every day | ORAL | Status: DC
  Administered 2020-12-14 – 2020-12-15 (×2): 25 mg via ORAL
  Filled 2020-12-14 (×2): qty 1

## 2020-12-14 MED ORDER — ONDANSETRON HCL 4 MG PO TABS: 4.0000 mg | ORAL_TABLET | Freq: Four times a day (QID) | ORAL | Status: DC | PRN

## 2020-12-14 MED ORDER — FERROUS SULFATE 325 (65 FE) MG PO TABS
325.0000 mg | ORAL_TABLET | Freq: Every day | ORAL | Status: DC
  Administered 2020-12-15 – 2020-12-17 (×3): 325 mg via ORAL
  Filled 2020-12-14 (×3): qty 1

## 2020-12-14 MED ORDER — SODIUM CHLORIDE 0.9 % IV SOLN: 250.0000 mL | INTRAVENOUS | Status: DC | PRN

## 2020-12-14 MED ORDER — ALLOPURINOL 100 MG PO TABS
200.0000 mg | ORAL_TABLET | Freq: Every day | ORAL | Status: DC
  Administered 2020-12-14 – 2020-12-17 (×4): 200 mg via ORAL
  Filled 2020-12-14 (×4): qty 2

## 2020-12-14 MED ORDER — SODIUM CHLORIDE 0.9 % IV SOLN: 10.0000 mL/h | Freq: Once | INTRAVENOUS | Status: DC

## 2020-12-14 MED ORDER — ACETAMINOPHEN 325 MG PO TABS: 650.0000 mg | ORAL_TABLET | Freq: Four times a day (QID) | ORAL | Status: DC | PRN

## 2020-12-14 MED ORDER — ONDANSETRON HCL 4 MG/2ML IJ SOLN: 4.0000 mg | Freq: Four times a day (QID) | INTRAMUSCULAR | Status: DC | PRN

## 2020-12-14 MED ORDER — SODIUM CHLORIDE 0.9% FLUSH
3.0000 mL | Freq: Two times a day (BID) | INTRAVENOUS | Status: DC
  Administered 2020-12-14 – 2020-12-17 (×5): 3 mL via INTRAVENOUS

## 2020-12-14 MED ORDER — ACETAMINOPHEN 650 MG RE SUPP: 650.0000 mg | Freq: Four times a day (QID) | RECTAL | Status: DC | PRN

## 2020-12-14 MED ORDER — FUROSEMIDE 10 MG/ML IJ SOLN
40.0000 mg | Freq: Every day | INTRAMUSCULAR | Status: DC
  Administered 2020-12-14: 40 mg via INTRAVENOUS
  Filled 2020-12-14: qty 4

## 2020-12-14 MED ORDER — OXYCODONE HCL 5 MG PO TABS: 15.0000 mg | ORAL_TABLET | Freq: Four times a day (QID) | ORAL | Status: DC | PRN

## 2020-12-14 MED ORDER — SODIUM CHLORIDE 0.9% FLUSH: 3.0000 mL | INTRAVENOUS | Status: DC | PRN

## 2020-12-14 MED ORDER — SODIUM CHLORIDE 0.9 % IV SOLN
10.0000 mL/h | Freq: Once | INTRAVENOUS | Status: AC
  Administered 2020-12-14: 10 mL/h via INTRAVENOUS

## 2020-12-14 MED ORDER — GABAPENTIN 300 MG PO CAPS
300.0000 mg | ORAL_CAPSULE | Freq: Two times a day (BID) | ORAL | Status: DC
  Administered 2020-12-14 – 2020-12-15 (×3): 300 mg via ORAL
  Filled 2020-12-14 (×3): qty 1

## 2020-12-14 MED ORDER — VITAMIN D 25 MCG (1000 UNIT) PO TABS
5000.0000 [IU] | ORAL_TABLET | Freq: Every day | ORAL | Status: DC
  Administered 2020-12-14 – 2020-12-17 (×4): 5000 [IU] via ORAL
  Filled 2020-12-14 (×4): qty 5

## 2020-12-14 NOTE — ED Triage Notes (Signed)
Pt reports that last week he started coughing up a little bit of blood, he went to his PMD and they sent him for an xray (unsure of what xray and they told him that they wanted to do another xray, he is waiting for them to call to schedule it.) Last night he reports that he is coughing up even more blood and wants to be checked out.

## 2020-12-14 NOTE — ED Notes (Signed)
Seen by ZP, orders placed   Lucrezia Starch, MD 12/14/20 1020

## 2020-12-14 NOTE — ED Provider Notes (Signed)
[facility] Emergency Department Provider Note  ____________________________________________   Event Date/Time   First MD Initiated Contact with Patient 12/14/20 [phone]     (approximate)  I have reviewed the triage vital signs and the nursing notes.   HISTORY  Chief Complaint Hemoptysis   HPI Vincent Mason is a 85 y.o. male with a past medical history of A. fib anticoagulated on Eliquis and rate controlled on metoprolol, COPD, HTN, HPL, CKD, OSA and chronic pain who presents for assessment of hemoptysis.  Patient states he is about 2 weeks of some blood-streaked sputum in the morning and in the evening but seem to increase in amount last night and this morning.  Patient states he thinks he coughed up a couple ounces last night and this morning.  He has not had any new shortness of breath, chest pain, [place] pain and does not think he has vomited any blood.  No blood in stool or urine.  He denies any nausea, diarrhea, dysuria, abdominal pain, back pain, rash or extremity pain.  No recent falls or injuries.  No other acute concerns at this time         Past Medical History:  Diagnosis Date   Acute encephalopathy 09/03/2015   Atrial fibrillation and flutter [facility]) 2013   Dr Nehemiah Massed at [facility]  on .    C. difficile colitis 09/12/2015   COPD (chronic obstructive pulmonary disease) (HCC)    HCAP (healthcare-associated pneumonia) 09/12/2015   Hyperlipidemia    Hypertension    Kidney disease, chronic, stage III (moderate, EGFR 30-59 ml/min) ([place])    ARF 2013   OSA (obstructive sleep apnea)    non-compliant with CPAP.    Severe sepsis with septic shock ([place]) 09/12/2015    Patient Active Problem List   Diagnosis Date Noted   Lumbar spondylosis 01/02/2020   Compression fracture of third lumbar vertebra ([place]) 01/02/2020   Discogenic thoracic pain 01/02/2020   Chronic pain syndrome 01/02/2020   Restless leg syndrome 06/22/2018   Erectile dysfunction due to  arterial insufficiency 05/04/2018   Biliary cirrhosis ([place]) 02/21/2018   Seizure ([place]) 08/06/2017   Vitamin D deficiency 04/08/2017   Neuropathy 04/08/2017   Altered mental status 01/21/2017   Blood in stool    Anemia due to chronic blood loss    Hypomagnesemia 09/14/2015   Chronic atrial fibrillation ([place]) 09/12/2015   Essential hypertension 09/12/2015   Fecal occult blood test positive 09/12/2015   Nodular hyperplasia of liver    Normocytic anemia 09/03/2015   Opioid type dependence, continuous ([place]) 09/03/2015   Hypokalemia 08/26/2015   Hyperuricemia 07/09/2015   Coronary artery disease 01/22/2015   Chronic kidney disease (CKD), stage III (moderate) (HCC) 01/22/2015   Chronic constipation 01/22/2015   COPD (chronic obstructive pulmonary disease) ([place]) 01/22/2015   ED (erectile dysfunction) of organic origin 01/22/2015   Lymphedema of both lower extremities 01/22/2015   Fatigue 01/22/2015   Blood in the urine 01/22/2015   Calculus of kidney 01/22/2015   Memory loss 01/22/2015   Hypertensive pulmonary vascular disease ([employer]) 01/22/2015   Displacement of cervical intervertebral disc without myelopathy 01/22/2015   Seizure disorder ([place]) 01/17/2015   Obstructive sleep apnea 01/17/2015   Hyperlipidemia 01/17/2015   Leg swelling 01/17/2015   Lumbar degenerative disc disease 01/17/2015   Back pain 11/12/2014   TI (tricuspid incompetence) 08/02/2014    Past Surgical History:  Procedure Laterality Date   CATARACT EXTRACTION     CORONARY ARTERY BYPASS GRAFT  10/1997  EYE SURGERY     history of cervical discectomy  2009   C5-C6   Hyperplastic colon polyp  02/2002   Sigmoid polyps   KYPHOPLASTY N/A 02/06/2020   Procedure: T12 & L3 Kyphoplasty;  Surgeon: Hessie Knows, MD;  Location: [facility] ORS;  Service: Orthopedics;  Laterality: N/A;    Prior to Admission medications   Medication Sig Start Date End Date Taking? Authorizing Provider  allopurinol (ZYLOPRIM) 100 MG tablet Take  2 tablets (200 mg total) by mouth daily. 02/15/20   Birdie Sons, MD  apixaban (ELIQUIS) 5 MG TABS tablet Take 1/2 tablet twice a day Patient taking differently: Take 5 mg by mouth 2 (two) times daily. 06/22/18   Birdie Sons, MD  Cholecalciferol (VITAMIN D-3) 125 MCG (5000 UT) TABS Take 5,000 Units by mouth daily.    [provider]  Cholecalciferol (VITAMIN D3) 1000 units CAPS Take 2,000 Units by mouth daily.     [provider]  ferrous sulfate 325 (65 FE) MG tablet Take 1 tablet (325 mg total) by mouth daily. 06/22/18   Birdie Sons, MD  gabapentin (NEURONTIN) 300 MG capsule Take 1 capsule (300 mg total) by mouth 2 (two) times daily. 02/29/20   Birdie Sons, MD  metoprolol succinate (TOPROL-XL) 25 MG 24 hr tablet TAKE 1/2 A TABLET BY MOUTH DAILY Patient taking differently: Take 12.5 mg by mouth daily. 01/04/20   Birdie Sons, MD  oxyCODONE (ROXICODONE) 15 MG immediate release tablet TAKE 1 TABLET BY MOUTH EVERY 4 HOURS AS NEEDED 12/12/20   Birdie Sons, MD  polyethylene glycol (MIRALAX / GLYCOLAX) packet Take 17 g by mouth daily. Patient taking differently: Take 17 g by mouth daily as needed for mild constipation. 12/08/16   Gladstone Lighter, MD  PROCTOZONE-HC 2.5 % rectal cream PLACE 1 APPLICATION RECTALLY TWICE DAILYAS DIRECTED Patient taking differently: Place 1 application rectally 2 (two) times daily as needed (discomfort/hemorrhoids). 10/16/19   Chrismon, Vickki Muff, PA-C  rosuvastatin (CRESTOR) 10 MG tablet TAKE 1 TABLET BY MOUTH ONCE DAILY 11/05/20   Birdie Sons, MD  spironolactone (ALDACTONE) 25 MG tablet Take 1 tablet by mouth daily. 10/04/18   [provider]  torsemide (DEMADEX) 20 MG tablet Take one daily as needed for excessive swelling Patient taking differently: Take 20 mg by mouth daily. 06/22/18   Birdie Sons, MD    Allergies Patient has no known allergies.  Family History  Problem Relation Age of Onset   Hypertension  Mother    Hypertension Father     Social History Social History   Tobacco Use   Smoking status: Former   Smokeless tobacco: Never   Tobacco comments:    Quit in 1990  Vaping Use   Vaping Use: Never used  Substance Use Topics   Alcohol use: No   Drug use: No    Review of Systems  Review of Systems  Constitutional:  Negative for chills and fever.  HENT:  Negative for sore throat.   Eyes:  Negative for pain.  Respiratory:  Positive for cough, hemoptysis and sputum production. Negative for stridor.   Cardiovascular:  Negative for chest pain.  Gastrointestinal:  Negative for vomiting.  Genitourinary:  Negative for dysuria, flank pain and hematuria.  Musculoskeletal:  Negative for myalgias.  Skin:  Negative for rash.  Neurological:  Negative for seizures, loss of consciousness and headaches.  Psychiatric/Behavioral:  Negative for suicidal ideas.   All other systems reviewed and are negative.  ____________________________________________   PHYSICAL EXAM:  VITAL SIGNS: ED Triage Vitals  Enc Vitals Group     BP 12/14/20 0900 (!) 144/72     Pulse Rate 12/14/20 0900 (!) 121     Resp 12/14/20 0900 (!) 22     Temp 12/14/20 0900 98.2 F (36.8 C)     Temp Source 12/14/20 0900 Oral     SpO2 12/14/20 0900 97 %     Weight 12/14/20 0901 180 lb (81.6 kg)     Height 12/14/20 0901 5' 10"  (1.778 m)     Head Circumference --      Peak Flow --      Pain Score 12/14/20 0900 0     Pain Loc --      Pain Edu? --      Excl. in [place]? --    Vitals:   12/14/20 0900  BP: (!) 144/72  Pulse: (!) 121  Resp: (!) 22  Temp: 98.2 F (36.8 C)  SpO2: 97%   Physical Exam Vitals and nursing note reviewed.  Constitutional:      General: He is in acute distress.     Appearance: He is well-developed. He is ill-appearing.  HENT:     Head: Normocephalic and atraumatic.     Right Ear: External ear normal.     Left Ear: External ear normal.     Nose: Nose normal.     Mouth/Throat:      Mouth: Mucous membranes are moist.  Eyes:     Conjunctiva/sclera: Conjunctivae normal.  Cardiovascular:     Rate and Rhythm: Regular rhythm. Tachycardia present.     Heart sounds: No murmur heard. Pulmonary:     Effort: Pulmonary effort is normal. No respiratory distress.     Breath sounds: Normal breath sounds.  Abdominal:     Palpations: Abdomen is soft.     Tenderness: There is no abdominal tenderness.  Musculoskeletal:     Cervical back: Neck supple.  Skin:    General: Skin is warm and dry.     Capillary Refill: Capillary refill takes 2 to 3 seconds.  Neurological:     Mental Status: He is alert and oriented to person, place, and time.  Psychiatric:        Mood and Affect: Mood normal.     ____________________________________________   LABS (all labs ordered are listed, but only abnormal results are displayed)  Labs Reviewed  COMPREHENSIVE METABOLIC PANEL - Abnormal; Notable for the following components:      Result Value   Glucose, Bld 125 (*)    BUN 40 (*)    Creatinine, Ser 2.19 (*)    GFR, Estimated 29 (*)    All other components within normal limits  CBC - Abnormal; Notable for the following components:   WBC 1.9 (*)    RBC 1.61 (*)    Hemoglobin 6.0 (*)    HCT 17.8 (*)    MCV 110.6 (*)    MCH 37.3 (*)    RDW 18.9 (*)    Platelets 48 (*)    nRBC 1.6 (*)    All other components within normal limits  RETICULOCYTES - Abnormal; Notable for the following components:   RBC. 1.67 (*)    Immature Retic Fract 35.6 (*)    All other components within normal limits  RESP PANEL BY RT-PCR (FLU A&B, COVID) ARPGX2  PROTIME-INR  APTT  PATHOLOGIST SMEAR REVIEW  PROCALCITONIN  POC OCCULT BLOOD, ED  TYPE AND SCREEN  PREPARE RBC (CROSSMATCH)  PREPARE PLATELET PHERESIS   ____________________________________________  EKG   ____________________________________________  RADIOLOGY  ED MD interpretation: CT chest remarkable for patchy groundglass opacities in both  lungs superimposed on some nodular soft tissue densities at the bases.  There is also diffuse bronchial wall thickening and some bronchiectasis bilaterally.  Increase in size of left adrenal lipoma or myolipoma.  Nonobstructing right renal stone and aortic atherosclerosis.  No evidence of large effusion, pneumothorax or other clear acute thoracic process.   Official radiology report(s): CT Chest Wo Contrast  Result Date: 12/14/2020 CLINICAL DATA:  Hemoptysis. EXAM: CT CHEST WITHOUT CONTRAST TECHNIQUE: Multidetector CT imaging of the chest was performed following the standard protocol without IV contrast. COMPARISON:  08/30/2011 FINDINGS: Cardiovascular: Heart is enlarged. Status post CABG. Mild atherosclerotic calcification is noted in the wall of the thoracic aorta. Mediastinum/Nodes: Scattered small mediastinal lymph nodes evident. No evidence for gross hilar lymphadenopathy although assessment is limited by the lack of intravenous contrast on today's study. The esophagus has normal imaging features. There is no axillary lymphadenopathy. Lungs/Pleura: Patchy ground-glass opacities identified in both lungs. Ground-glass disease in the lung bases has superimposed areas of more confluent nodular soft tissue density (including 12 mm paraspinal right lower lobe on image 114/2 and 2.3 cm posterior left lower lobe on 01/20 8/2. Diffuse bronchial wall thickening with cylindrical bronchiectasis noted bilaterally. No pleural effusion. No pneumothorax. Upper Abdomen: 5 mm nonobstructing stone noted upper pole right kidney. 2.7 cm left adrenal lipoma or myelolipoma has decreased from 3.5 cm previously. Musculoskeletal: No worrisome lytic or sclerotic osseous abnormality. Bilateral gynecomastia. Evidence of vertebral augmentation at L1. IMPRESSION: 1. Patchy ground-glass opacities in both lungs with superimposed areas of more confluent nodular soft tissue density at the bases. Imaging features likely related to  infectious/inflammatory etiology and atypical infection would be a consideration. Follow-up CT in 3 months recommended to ensure resolution. 2. Diffuse bronchial wall thickening with cylindrical bronchiectasis bilaterally. 3. Interval decrease in size of the left adrenal lipoma or myelolipoma. 4. Nonobstructing right renal stone. 5. Aortic Atherosclerosis (ICD10-I70.0). Electronically Signed   By: Misty Stanley M.D.   On: 12/14/2020 10:52    ____________________________________________   PROCEDURES  Procedure(s) performed (including Critical Care):  .Critical Care  Date/Time: 12/14/2020 10:02 AM Performed by: Lucrezia Starch, MD Authorized by: Lucrezia Starch, MD   Critical care provider statement:    Critical care was necessary to treat or prevent imminent or life-threatening deterioration of the following conditions:  Circulatory failure   ____________________________________________   INITIAL IMPRESSION / ASSESSMENT AND PLAN / ED COURSE      Patient presents with above-stated history exam for assessment of some increase in hemoptysis over the last 24 hours that he has been experiencing mostly in the mornings and evenings over the last 2 weeks in the setting of being anticoagulated for his A. fib.  On arrival he is tachycardic with heart rate in the 120s and slight tachypneic at 22 with otherwise stable vital signs on room air.  States he has only coughed up about 1 drop of blood since earlier this morning.  Differential includes PE, malignancy, pneumonia, bronchitis, diffuse alveolar hemorrhage versus other acute infectious process in the setting of being anticoagulated.  CT chest remarkable for patchy groundglass opacities in both lungs superimposed on some nodular soft tissue densities at the bases.  There is also diffuse bronchial wall thickening and some bronchiectasis bilaterally.  Increase in size of left adrenal lipoma or myolipoma.  Nonobstructing right renal stone and  aortic atherosclerosis.  No evidence of large effusion, pneumothorax or other clear acute thoracic process  CMP markable for creatinine of 2.19 with a BUN of 40 and no other significant electrode or metabolic derangements.  CBC remarkable for pancytopenia with WBC count of 5.9, hemoglobin of 6 and platelets of 48.  Unclear urology for this.  Reticulocyte percent is 1.6.  Lower suspicion for PE as patient states he has been compliant with his Eliquis.  No obvious mass or lung cancer seen on CT chest most consistent with likely bronchitis I suspect patient has had hemoptysis in the setting of thrombocytopenia and anticoagulation.  Will order 1 unit.  BC's and 1 unit platelets.  I will plan to admit to medicine service for further evaluation management.     ____________________________________________   FINAL CLINICAL IMPRESSION(S) / ED DIAGNOSES  Final diagnoses:  Hemoptysis  Pancytopenia (HCC)    Medications  0.9 %  sodium chloride infusion (has no administration in time range)  0.9 %  sodium chloride infusion (has no administration in time range)     ED Discharge Orders     None        Note:  This document was prepared using Dragon voice recognition software and may include unintentional dictation errors.    Lucrezia Starch, MD 12/14/20 [phone]

## 2020-12-14 NOTE — ED Notes (Signed)
Blood bank called. RBCs ready. Waiting on IV team to insert IV.

## 2020-12-14 NOTE — ED Notes (Signed)
Helped pt to stand and use urinal

## 2020-12-14 NOTE — H&P (Addendum)
History and Physical    Vincent Mason EVO:[id number] DOB: 08/13/1935 DOA: 12/14/2020  PCP: Birdie Sons, MD   Patient coming from: Home  I have personally briefly reviewed patient's old medical records in [place]  Chief Complaint: Coughing up blood  HPI: Vincent Mason is a 85 y.o. male with medical history significant for atrial fibrillation on chronic anticoagulation therapy with Coumadin, COPD, hypertension with stage III chronic kidney disease, dyslipidemia who presents to the ER for evaluation of hemoptysis.  Patient states that for the last 2 weeks he usually coughs up a small amount of dark blood fasting in the morning but over the last 24 hours he notes that he has had an increase in the amount of blood he coughs up.  He said it was initially a teaspoonful but one day prior to his admission he coughed up a little bit more than that which was concerning to him. He had contacted his primary care provider who decreased his dose of Eliquis and ordered a chest x-ray.  He is unsure of what the results of the x-ray showed. He complains of shortness of breath with exertion but denies having any lower extremity swelling or orthopnea. He denies having any chest pain, no fever, no chills, no abdominal pain, no changes in his bowel habits, no dizziness, no lightheadedness, no palpitations, no diaphoresis, no blurred vision no focal deficits. He denies having any NSAID use and stools have been brown in color.  Denies having any hematuria, hematochezia, hematemesis or melena. Labs show sodium 139, potassium 4.3, chloride 106, bicarb 23, glucose 125, BUN 40, creatinine 2.19, calcium 9.0, alkaline phosphatase 45, albumin 3.7, AST 17, ALT 11, total protein 6.7, total bilirubin 0.9, procalcitonin 0.12, white count 1.9, hemoglobin 6.0, hematocrit 17.8, MCV 110, RDW 18.9, platelet count 48,000 Respiratory viral panel is negative CT scan of the chest without contrast shows patchy ground-glass  opacities in both lungs with superimposed areas of more confluent nodular soft tissue density at the bases. Imaging features likely related to infectious/inflammatory etiology and atypical infection would be a consideration. Follow-up CT in 3 months recommended to ensure resolution. Diffuse bronchial wall thickening with cylindrical bronchiectasis bilaterally.Interval decrease in size of the left adrenal lipoma or myelolipoma.Non obstructing right renal stone. Aortic Atherosclerosis (ICD10-I70.0).   ED Course: Patient is an 85 year old Caucasian male who presents to the ER for evaluation of a 2-week history of hemoptysis and is found to have pancytopenia. Patient is scheduled to be transfused 2 units of packed RBC. CT scan of the chest without contrast shows patchy groundglass opacities in both lungs with superimposed areas of more confluent nodular soft tissue density at the bases. He will be admitted to the hospital for further evaluation.   Review of Systems: As per HPI otherwise all other systems reviewed and negative.    Past Medical History:  Diagnosis Date   Acute encephalopathy 09/03/2015   Atrial fibrillation and flutter [facility]) 2013   Dr Nehemiah Massed at [facility]  on [address].    C. difficile colitis 09/12/2015   COPD (chronic obstructive pulmonary disease) (HCC)    HCAP (healthcare-associated pneumonia) 09/12/2015   Hyperlipidemia    Hypertension    Kidney disease, chronic, stage III (moderate, EGFR 30-59 ml/min) ([place])    ARF 2013   OSA (obstructive sleep apnea)    non-compliant with CPAP.    Severe sepsis with septic shock ([place]) 09/12/2015    Past Surgical History:  Procedure Laterality Date   CATARACT EXTRACTION  CORONARY ARTERY BYPASS GRAFT  10/1997   EYE SURGERY     history of cervical discectomy  2009   C5-C6   Hyperplastic colon polyp  02/2002   Sigmoid polyps   KYPHOPLASTY N/A 02/06/2020   Procedure: T12 & L3 Kyphoplasty;  Surgeon: Hessie Knows, MD;  Location: [facility] ORS;   Service: Orthopedics;  Laterality: N/A;     reports that he has quit smoking. He has never used smokeless tobacco. He reports that he does not drink alcohol and does not use drugs.  No Known Allergies  Family History  Problem Relation Age of Onset   Hypertension Mother    Hypertension Father       Prior to Admission medications   Medication Sig Start Date End Date Taking? Authorizing Provider  allopurinol (ZYLOPRIM) 100 MG tablet Take 2 tablets (200 mg total) by mouth daily. 02/15/20   Birdie Sons, MD  apixaban (ELIQUIS) 5 MG TABS tablet Take 1/2 tablet twice a day Patient taking differently: Take 5 mg by mouth 2 (two) times daily. 06/22/18   Birdie Sons, MD  Cholecalciferol (VITAMIN D-3) 125 MCG (5000 UT) TABS Take 5,000 Units by mouth daily.    [provider]  Cholecalciferol (VITAMIN D3) 1000 units CAPS Take 2,000 Units by mouth daily.     [provider]  ferrous sulfate 325 (65 FE) MG tablet Take 1 tablet (325 mg total) by mouth daily. 06/22/18   Birdie Sons, MD  gabapentin (NEURONTIN) 300 MG capsule Take 1 capsule (300 mg total) by mouth 2 (two) times daily. 02/29/20   Birdie Sons, MD  metoprolol succinate (TOPROL-XL) 25 MG 24 hr tablet TAKE 1/2 A TABLET BY MOUTH DAILY Patient taking differently: Take 12.5 mg by mouth daily. 01/04/20   Birdie Sons, MD  oxyCODONE (ROXICODONE) 15 MG immediate release tablet TAKE 1 TABLET BY MOUTH EVERY 4 HOURS AS NEEDED 12/12/20   Birdie Sons, MD  polyethylene glycol (MIRALAX / GLYCOLAX) packet Take 17 g by mouth daily. Patient taking differently: Take 17 g by mouth daily as needed for mild constipation. 12/08/16   Gladstone Lighter, MD  PROCTOZONE-HC 2.5 % rectal cream PLACE 1 APPLICATION RECTALLY TWICE DAILYAS DIRECTED Patient taking differently: Place 1 application rectally 2 (two) times daily as needed (discomfort/hemorrhoids). 10/16/19   Chrismon, Vickki Muff, PA-C  rosuvastatin (CRESTOR) 10 MG tablet  TAKE 1 TABLET BY MOUTH ONCE DAILY 11/05/20   Birdie Sons, MD  spironolactone (ALDACTONE) 25 MG tablet Take 1 tablet by mouth daily. 10/04/18   [provider]  torsemide (DEMADEX) 20 MG tablet Take one daily as needed for excessive swelling Patient taking differently: Take 20 mg by mouth daily. 06/22/18   Birdie Sons, MD    Physical Exam: Vitals:   12/14/20 0900 12/14/20 0901 12/14/20 1100 12/14/20 1130  BP: (!) 144/72  (!) 142/75 125/73  Pulse: (!) 121  (!) 120 (!) 137  Resp: (!) 22  20 (!) 26  Temp: 98.2 F (36.8 C)     TempSrc: Oral     SpO2: 97%  100% 95%  Weight:  81.6 kg    Height:  5' 10"  (1.778 m)       Vitals:   12/14/20 0900 12/14/20 0901 12/14/20 1100 12/14/20 1130  BP: (!) 144/72  (!) 142/75 125/73  Pulse: (!) 121  (!) 120 (!) 137  Resp: (!) 22  20 (!) 26  Temp: 98.2 F (36.8 C)     TempSrc:  Oral     SpO2: 97%  100% 95%  Weight:  81.6 kg    Height:  5' 10"  (1.778 m)        Constitutional: Alert and oriented x 3 . Not in any apparent distress HEENT:      Head: Normocephalic and atraumatic.         Eyes: PERLA, EOMI, Conjunctivae pallor. Sclera is non-icteric.       Mouth/Throat: Mucous membranes are moist.       Neck: Supple with no signs of meningismus. Cardiovascular: Irregularly irregular, tachycardic. No murmurs, gallops, or rubs. 2+ symmetrical distal pulses are present . No JVD. No LE edema Respiratory: Respiratory effort normal .bilateral air entry. No wheezes.  Faint crackles at the bases Gastrointestinal: Soft, non tender, and non distended with positive bowel sounds.  Genitourinary: No CVA tenderness. Musculoskeletal: Nontender with normal range of motion in all extremities. No cyanosis, or erythema of extremities. Neurologic:  Face is symmetric. Moving all extremities. No gross focal neurologic deficits . Skin: Skin is warm, dry.  Ecchymosis both forearms. Psychiatric: Mood and affect are normal    Labs on Admission: I have  personally reviewed following labs and imaging studies  CBC: Recent Labs  Lab 12/14/20 0911  WBC 1.9*  HGB 6.0*  HCT 17.8*  MCV 110.6*  PLT 48*   Basic Metabolic Panel: Recent Labs  Lab 12/14/20 0911  NA 139  K 4.3  CL 106  CO2 23  GLUCOSE 125*  BUN 40*  CREATININE 2.19*  CALCIUM 9.0   GFR: Estimated Creatinine Clearance: 26.4 mL/min (A) (by C-G formula based on SCr of 2.19 mg/dL (H)). Liver Function Tests: Recent Labs  Lab 12/14/20 0911  AST 17  ALT 11  ALKPHOS 45  BILITOT 0.9  PROT 6.7  ALBUMIN 3.7   No results for input(s): LIPASE, AMYLASE in the last 168 hours. No results for input(s): AMMONIA in the last 168 hours. Coagulation Profile: No results for input(s): INR, PROTIME in the last 168 hours. Cardiac Enzymes: No results for input(s): CKTOTAL, CKMB, CKMBINDEX, TROPONINI in the last 168 hours. BNP (last 3 results) No results for input(s): PROBNP in the last 8760 hours. HbA1C: No results for input(s): HGBA1C in the last 72 hours. CBG: No results for input(s): GLUCAP in the last 168 hours. Lipid Profile: No results for input(s): CHOL, HDL, LDLCALC, TRIG, CHOLHDL, LDLDIRECT in the last 72 hours. Thyroid Function Tests: No results for input(s): TSH, T4TOTAL, FREET4, T3FREE, THYROIDAB in the last 72 hours. Anemia Panel: Recent Labs    12/14/20 0911  RETICCTPCT 1.6   Urine analysis:    Component Value Date/Time   COLORURINE YELLOW (A) 11/27/2019 1331   APPEARANCEUR CLEAR (A) 11/27/2019 1331   APPEARANCEUR Clear 06/30/2014 0729   LABSPEC 1.018 11/27/2019 1331   LABSPEC 1.016 06/30/2014 0729   PHURINE 7.0 11/27/2019 1331   GLUCOSEU NEGATIVE 11/27/2019 1331   GLUCOSEU Negative 06/30/2014 0729   HGBUR NEGATIVE 11/27/2019 1331   BILIRUBINUR NEGATIVE 11/27/2019 1331   BILIRUBINUR Negative 06/30/2014 0729   KETONESUR NEGATIVE 11/27/2019 1331   PROTEINUR NEGATIVE 11/27/2019 1331   NITRITE NEGATIVE 11/27/2019 1331   LEUKOCYTESUR NEGATIVE 11/27/2019  1331   LEUKOCYTESUR Negative 06/30/2014 0729    Radiological Exams on Admission: CT Chest Wo Contrast  Result Date: 12/14/2020 CLINICAL DATA:  Hemoptysis. EXAM: CT CHEST WITHOUT CONTRAST TECHNIQUE: Multidetector CT imaging of the chest was performed following the standard protocol without IV contrast. COMPARISON:  08/30/2011 FINDINGS: Cardiovascular: Heart is enlarged.  Status post CABG. Mild atherosclerotic calcification is noted in the wall of the thoracic aorta. Mediastinum/Nodes: Scattered small mediastinal lymph nodes evident. No evidence for gross hilar lymphadenopathy although assessment is limited by the lack of intravenous contrast on today's study. The esophagus has normal imaging features. There is no axillary lymphadenopathy. Lungs/Pleura: Patchy ground-glass opacities identified in both lungs. Ground-glass disease in the lung bases has superimposed areas of more confluent nodular soft tissue density (including 12 mm paraspinal right lower lobe on image 114/2 and 2.3 cm posterior left lower lobe on 01/20 8/2. Diffuse bronchial wall thickening with cylindrical bronchiectasis noted bilaterally. No pleural effusion. No pneumothorax. Upper Abdomen: 5 mm nonobstructing stone noted upper pole right kidney. 2.7 cm left adrenal lipoma or myelolipoma has decreased from 3.5 cm previously. Musculoskeletal: No worrisome lytic or sclerotic osseous abnormality. Bilateral gynecomastia. Evidence of vertebral augmentation at L1. IMPRESSION: 1. Patchy ground-glass opacities in both lungs with superimposed areas of more confluent nodular soft tissue density at the bases. Imaging features likely related to infectious/inflammatory etiology and atypical infection would be a consideration. Follow-up CT in 3 months recommended to ensure resolution. 2. Diffuse bronchial wall thickening with cylindrical bronchiectasis bilaterally. 3. Interval decrease in size of the left adrenal lipoma or myelolipoma. 4. Nonobstructing  right renal stone. 5. Aortic Atherosclerosis (ICD10-I70.0). Electronically Signed   By: Misty Stanley M.D.   On: 12/14/2020 10:52     Assessment/Plan Principal Problem:   Cough with hemoptysis Active Problems:   Chronic kidney disease (CKD), stage III (moderate) (HCC)   Chronic atrial fibrillation (HCC)   Essential hypertension   Acute on chronic systolic CHF (congestive heart failure) (HCC)   Pancytopenia (HCC)      Cough with hemoptysis Unclear etiology CT scan of the chest shows groundglass opacities in both lungs with superimposed areas of more confluent nodular soft tissue density at the bases.  Imaging features likely related to infectious/inflammatory etiology and atypical infection would be a consideration. Will hold apixaban due to thrombocytopenia and anemia requiring blood transfusion. Will consult pulmonology     Acute on chronic systolic CHF Patient with complaints of shortness of breath with exertion, BNP is pending and hemoptysis may be secondary to blood-tinged frothy sputum. Continue metoprolol, spironolactone and switch Demadex to Lasix 40 mg IV daily Patient's last 2D echocardiogram from 2020 shows an LVEF of 40%. We will repeat 2D echocardiogram to assess LVEF     Pancytopenia Patient noted to have pancytopenia with a hemoglobin of 6.0g/dl, platelet count of 48,000 and white cell count of 1.9 Unclear etiology Patient is scheduled for transfusion of 2 units of packed RBC Will obtain iron studies Obtain Hemoccult Will request oncology consult     Coronary artery disease Status post CABG Continue rosuvastatin and metoprolol     Chronic atrial fibrillation Continue metoprolol for rate control Hold apixaban due to thrombocytopenia as well as anemia requiring blood transfusion.   DVT prophylaxis: SCD Code Status: full code  Family Communication: Greater than 50% of time was spent discussing patient's condition and plan of care with him at the  bedside.  All questions and concerns have been addressed.  CODE STATUS was discussed and he is a full code.Marland Kitchen  He lists his son Ralston Venus. Christ Kick. as his healthcare power of attorney. Disposition Plan: Back to previous home environment Consults called: Pulmonology/oncology: Status: Observation    Beverely Suen MD [facility]     12/14/2020, 1:13 PM

## 2020-12-14 NOTE — ED Notes (Signed)
Assisted Pt to use urinal. Then back into bed.

## 2020-12-14 NOTE — ED Notes (Signed)
RN attempted IV x2. Pt states "I am going to kill you if you miss again". IV consult placed.

## 2020-12-14 NOTE — Consult Note (Signed)
[place]  Telephone:([phone]) [phone] Fax:(336) [phone]  ID: Vincent Mason OB: 02-14-1936  MR#: [id number]  HOZ#:[id number]  Patient Care Team: Birdie Sons, MD as PCP - General (Family Medicine) Corey Skains, MD as Consulting Physician (Internal Medicine) Dingeldein, Remo Lipps, MD as Consulting Physician (Ophthalmology) Murlean Iba, MD as Consulting Physician (Internal Medicine) Gillis Santa, MD as Consulting Physician (Pain Medicine)  CHIEF COMPLAINT: Pancytopenia    PMH:  Vincent Mason is an 85 year old male with past medical history significant for atrial fibrillation on chronic anticoagulation with Coumadin, COPD, hypertension with CKD stage III, dyslipidemia who presented to the emergency room today for hemoptysis x2 weeks.  Recently noticed an increase in the amount of hemoptysis prompting him to be evaluated in the emergency room.  He did call his PCP who decreased his Eliquis in half and ordered a chest x-ray.  He denies any additional symptoms.  During work-up he was found to have hemoglobin of 6.0, platelet count of 48,000 and white count of of 1.9.  He was afebrile but was tachycardic and tachypneic prompting imaing which showed possible bronchitis, negative for a PE and infection.  He was dehydrated with elevated creatinine.  He was given IV fluids and plan is to give him a unit of packed red blood cells.  Hematology/oncology was consulted.   INTERVAL HISTORY: Vincent Mason had an uneventful evening.  Ultrasound of his abdomen did not reveal evidence of splenomegaly but did show mild nodular contour of the liver that may reflect underlying cirrhosis.  No focal lesions identified.   He received 2 units of packed red blood cells on 12/14/2020.  Labs improved slightly but continues to have a drop in his platelet count.  Patient denies any additional hemoptysis.  States he thinks is improving.  Breathing is stable.  He denies any abdominal pain.  He is eating  and drinking well.  REVIEW OF SYSTEMS:   Review of Systems  Constitutional: Negative.  Negative for chills, fever, malaise/fatigue and weight loss.  HENT:  Negative for congestion, ear pain and tinnitus.   Eyes: Negative.  Negative for blurred vision and double vision.  Respiratory:  Positive for cough, hemoptysis and sputum production. Negative for shortness of breath.   Cardiovascular: Negative.  Negative for chest pain, palpitations and leg swelling.  Gastrointestinal: Negative.  Negative for abdominal pain, constipation, diarrhea, nausea and vomiting.  Genitourinary:  Negative for dysuria, frequency and urgency.  Musculoskeletal:  Negative for back pain and falls.  Skin: Negative.  Negative for rash.  Neurological: Negative.  Negative for weakness and headaches.  Endo/Heme/Allergies: Negative.  Does not bruise/bleed easily.  Psychiatric/Behavioral: Negative.  Negative for depression. The patient is not nervous/anxious and does not have insomnia.    As per HPI. Otherwise, a complete review of systems is negative.  PAST MEDICAL HISTORY: Past Medical History:  Diagnosis Date   Acute encephalopathy 09/03/2015   Atrial fibrillation and flutter [facility]) 2013   Dr Nehemiah Massed at [facility]  on [address].    C. difficile colitis 09/12/2015   COPD (chronic obstructive pulmonary disease) (HCC)    HCAP (healthcare-associated pneumonia) 09/12/2015   Hyperlipidemia    Hypertension    Kidney disease, chronic, stage III (moderate, EGFR 30-59 ml/min) ([place])    ARF 2013   OSA (obstructive sleep apnea)    non-compliant with CPAP.    Severe sepsis with septic shock ([employer]) 09/12/2015    PAST SURGICAL HISTORY: Past Surgical History:  Procedure Laterality Date   CATARACT EXTRACTION  CORONARY ARTERY BYPASS GRAFT  10/1997   EYE SURGERY     history of cervical discectomy  2009   C5-C6   Hyperplastic colon polyp  02/2002   Sigmoid polyps   KYPHOPLASTY N/A 02/06/2020   Procedure: T12 & L3 Kyphoplasty;   Surgeon: Hessie Knows, MD;  Location: [facility] ORS;  Service: Orthopedics;  Laterality: N/A;    FAMILY HISTORY: Family History  Problem Relation Age of Onset   Hypertension Mother    Hypertension Father     ADVANCED DIRECTIVES (Y/N):  @ADVDIR @  HEALTH MAINTENANCE: Social History   Tobacco Use   Smoking status: Former   Smokeless tobacco: Never   Tobacco comments:    Quit in 1990  Vaping Use   Vaping Use: Never used  Substance Use Topics   Alcohol use: No   Drug use: No     Colonoscopy:  PAP:  Bone density:  Lipid panel:  No Known Allergies  Current Facility-Administered Medications  Medication Dose Route Frequency Provider Last Rate Last Admin   0.9 %  sodium chloride infusion  10 mL/hr Intravenous Once Lucrezia Starch, MD       0.9 %  sodium chloride infusion  10 mL/hr Intravenous Once Lucrezia Starch, MD       Current Outpatient Medications  Medication Sig Dispense Refill   allopurinol (ZYLOPRIM) 100 MG tablet Take 2 tablets (200 mg total) by mouth daily. 180 tablet 4   apixaban (ELIQUIS) 5 MG TABS tablet Take 1/2 tablet twice a day (Patient taking differently: Take 5 mg by mouth 2 (two) times daily.)     Cholecalciferol (VITAMIN D-3) 125 MCG (5000 UT) TABS Take 5,000 Units by mouth daily.     Cholecalciferol (VITAMIN D3) 1000 units CAPS Take 2,000 Units by mouth daily.      ferrous sulfate 325 (65 FE) MG tablet Take 1 tablet (325 mg total) by mouth daily. 60 tablet 3   gabapentin (NEURONTIN) 300 MG capsule Take 1 capsule (300 mg total) by mouth 2 (two) times daily. 60 capsule 12   metoprolol succinate (TOPROL-XL) 25 MG 24 hr tablet TAKE 1/2 A TABLET BY MOUTH DAILY (Patient taking differently: Take 12.5 mg by mouth daily.) 30 tablet 12   oxyCODONE (ROXICODONE) 15 MG immediate release tablet TAKE 1 TABLET BY MOUTH EVERY 4 HOURS AS NEEDED 168 tablet 0   polyethylene glycol (MIRALAX / GLYCOLAX) packet Take 17 g by mouth daily. (Patient taking differently: Take 17 g by  mouth daily as needed for mild constipation.) 14 each 0   PROCTOZONE-HC 2.5 % rectal cream PLACE 1 APPLICATION RECTALLY TWICE DAILYAS DIRECTED (Patient taking differently: Place 1 application rectally 2 (two) times daily as needed (discomfort/hemorrhoids).) 30 g 0   rosuvastatin (CRESTOR) 10 MG tablet TAKE 1 TABLET BY MOUTH ONCE DAILY 90 tablet 4   spironolactone (ALDACTONE) 25 MG tablet Take 1 tablet by mouth daily.     torsemide (DEMADEX) 20 MG tablet Take one daily as needed for excessive swelling (Patient taking differently: Take 20 mg by mouth daily.)      OBJECTIVE: Vitals:   12/14/20 1100 12/14/20 1130  BP: (!) 142/75 125/73  Pulse: (!) 120 (!) 137  Resp: 20 (!) 26  Temp:    SpO2: 100% 95%     Body mass index is 25.83 kg/m.    ECOG FS:1 - Symptomatic but completely ambulatory  Physical Exam Constitutional:      Appearance: Normal appearance.  HENT:     Head: Normocephalic  and atraumatic.  Eyes:     Pupils: Pupils are equal, round, and reactive to light.  Cardiovascular:     Rate and Rhythm: Normal rate and regular rhythm.     Heart sounds: Normal heart sounds. No murmur heard. Pulmonary:     Effort: Pulmonary effort is normal.     Breath sounds: Normal breath sounds. No wheezing.  Abdominal:     General: Bowel sounds are normal. There is no distension.     Palpations: Abdomen is soft.     Tenderness: There is no abdominal tenderness.  Musculoskeletal:        General: Normal range of motion.     Cervical back: Normal range of motion.  Skin:    General: Skin is warm and dry.     Findings: No rash.  Neurological:     Mental Status: He is alert and oriented to person, place, and time.  Psychiatric:        Judgment: Judgment normal.     LAB RESULTS:  Lab Results  Component Value Date   NA 139 12/14/2020   K 4.3 12/14/2020   CL 106 12/14/2020   CO2 23 12/14/2020   GLUCOSE 125 (H) 12/14/2020   BUN 40 (H) 12/14/2020   CREATININE 2.19 (H) 12/14/2020   CALCIUM  9.0 12/14/2020   PROT 6.7 12/14/2020   ALBUMIN 3.7 12/14/2020   AST 17 12/14/2020   ALT 11 12/14/2020   ALKPHOS 45 12/14/2020   BILITOT 0.9 12/14/2020   GFRNONAA 29 (L) 12/14/2020   GFRAA 37 (L) 04/02/2020    Lab Results  Component Value Date   WBC 1.9 (L) 12/14/2020   NEUTROABS 4.5 08/06/2017   HGB 6.0 (L) 12/14/2020   HCT 17.8 (L) 12/14/2020   MCV 110.6 (H) 12/14/2020   PLT 48 (L) 12/14/2020     STUDIES: DG Chest 2 View  Result Date: 11/30/2020 CLINICAL DATA:  Recent episodes of hemoptysis EXAM: CHEST - 2 VIEW COMPARISON:  09/29/2017 FINDINGS: Previous coronary bypass changes. Stable cardiomegaly without superimposed CHF or edema. No effusion or pneumothorax. Similar chronic emphysema pattern and basilar interstitial changes/fibrosis. Left upper lobe 10 mm nodular opacity versus nodule projects over the left third anterior rib shadow. This could represent developing nodule versus superimposed shadows/parenchymal scarring. Trachea midline. Aorta atherosclerotic. Bones are osteopenic. No acute osseous finding. IMPRESSION: Stable cardiomegaly without CHF Similar chronic changes of COPD and bibasilar interstitial lung disease/fibrosis Left upper lobe 10 mm nodular opacity versus nodule. Consider short-term follow-up at 3 months and if persist chest CT could be performed. Aortic Atherosclerosis (ICD10-I70.0) and Emphysema (ICD10-J43.9). Electronically Signed   By: Jerilynn Mages.  Shick M.D.   On: 11/30/2020 08:43   CT Chest Wo Contrast  Result Date: 12/14/2020 CLINICAL DATA:  Hemoptysis. EXAM: CT CHEST WITHOUT CONTRAST TECHNIQUE: Multidetector CT imaging of the chest was performed following the standard protocol without IV contrast. COMPARISON:  08/30/2011 FINDINGS: Cardiovascular: Heart is enlarged. Status post CABG. Mild atherosclerotic calcification is noted in the wall of the thoracic aorta. Mediastinum/Nodes: Scattered small mediastinal lymph nodes evident. No evidence for gross hilar  lymphadenopathy although assessment is limited by the lack of intravenous contrast on today's study. The esophagus has normal imaging features. There is no axillary lymphadenopathy. Lungs/Pleura: Patchy ground-glass opacities identified in both lungs. Ground-glass disease in the lung bases has superimposed areas of more confluent nodular soft tissue density (including 12 mm paraspinal right lower lobe on image 114/2 and 2.3 cm posterior left lower lobe on 01/20 8/2. Diffuse  bronchial wall thickening with cylindrical bronchiectasis noted bilaterally. No pleural effusion. No pneumothorax. Upper Abdomen: 5 mm nonobstructing stone noted upper pole right kidney. 2.7 cm left adrenal lipoma or myelolipoma has decreased from 3.5 cm previously. Musculoskeletal: No worrisome lytic or sclerotic osseous abnormality. Bilateral gynecomastia. Evidence of vertebral augmentation at L1. IMPRESSION: 1. Patchy ground-glass opacities in both lungs with superimposed areas of more confluent nodular soft tissue density at the bases. Imaging features likely related to infectious/inflammatory etiology and atypical infection would be a consideration. Follow-up CT in 3 months recommended to ensure resolution. 2. Diffuse bronchial wall thickening with cylindrical bronchiectasis bilaterally. 3. Interval decrease in size of the left adrenal lipoma or myelolipoma. 4. Nonobstructing right renal stone. 5. Aortic Atherosclerosis (ICD10-I70.0). Electronically Signed   By: Misty Stanley M.D.   On: 12/14/2020 10:52    ASSESSMENT: Vincent Mason is a 85 year old male with significant medical history who presents to emergency room for hemoptysis for the past 2 weeks in the setting of anticoagulation for atrial fibrillation.  He was admitted for pancytopenia.  PLAN:   Pancytopenia: Differentials include cirrhosis versus MDS/leukemia. Will add multiple myeloma panel, LDH; reticulocyte count; haptoglobin; A15, folic acid; peripheral smear is pending.   Kappa lambda light chain Complete ultrasound of abdomen showed no splenomegaly but mild cirrhosis. Patient consented to bone marrow biopsy.  We will get this ordered and scheduled for tomorrow. Continue to hold Eliquis. Transfuse PRBC for hemoglobin equal to or less than 7 and platelets equal to or less than 10,000.  Chronic kidney disease: Creatinine remains around 2. Eliquis will stay in the system for a while given his renal insufficiency.  I do not think there is any significant reason to reverse Eliquis/transfuse platelets [unless patient is hemodynamically unstable/coughing up blood profusely or in ICU. Hemoptysis appears to be stable.  Low vitamin B12 levels: B12 level on 12/14/2020 was 165. Plan to give B12 injections 1000 mcg daily x3 then monthly.  Disposition: Hematology labs still pending. Plan for bone marrow biopsy tomorrow.  Patient consented. Agree with additional blood today.   I spent 50 minutes dedicated to the care of this patient (face-to-face and non-face-to-face) on the date of the encounter to include what is described in the assessment and plan.  Patient expressed understanding and was in agreement with this plan. He also understands that He can call clinic at any time with any questions, concerns, or complaints.   Cancer Staging No matching staging information was found for the patient.  Jacquelin Hawking, NP   12/14/2020 12:57 PM

## 2020-12-15 ENCOUNTER — Encounter: Payer: Self-pay | Admitting: Family Medicine

## 2020-12-15 DIAGNOSIS — Z8249 Family history of ischemic heart disease and other diseases of the circulatory system: Secondary | ICD-10-CM | POA: Diagnosis not present

## 2020-12-15 DIAGNOSIS — E785 Hyperlipidemia, unspecified: Secondary | ICD-10-CM | POA: Diagnosis present

## 2020-12-15 DIAGNOSIS — Z951 Presence of aortocoronary bypass graft: Secondary | ICD-10-CM | POA: Diagnosis not present

## 2020-12-15 DIAGNOSIS — D469 Myelodysplastic syndrome, unspecified: Secondary | ICD-10-CM | POA: Diagnosis present

## 2020-12-15 DIAGNOSIS — D61818 Other pancytopenia: Secondary | ICD-10-CM | POA: Diagnosis not present

## 2020-12-15 DIAGNOSIS — G894 Chronic pain syndrome: Secondary | ICD-10-CM | POA: Diagnosis present

## 2020-12-15 DIAGNOSIS — I5023 Acute on chronic systolic (congestive) heart failure: Secondary | ICD-10-CM

## 2020-12-15 DIAGNOSIS — J449 Chronic obstructive pulmonary disease, unspecified: Secondary | ICD-10-CM | POA: Diagnosis present

## 2020-12-15 DIAGNOSIS — E538 Deficiency of other specified B group vitamins: Secondary | ICD-10-CM | POA: Insufficient documentation

## 2020-12-15 DIAGNOSIS — Z7901 Long term (current) use of anticoagulants: Secondary | ICD-10-CM | POA: Diagnosis not present

## 2020-12-15 DIAGNOSIS — I272 Pulmonary hypertension, unspecified: Secondary | ICD-10-CM | POA: Diagnosis present

## 2020-12-15 DIAGNOSIS — Z87891 Personal history of nicotine dependence: Secondary | ICD-10-CM | POA: Diagnosis not present

## 2020-12-15 DIAGNOSIS — Z79899 Other long term (current) drug therapy: Secondary | ICD-10-CM | POA: Diagnosis not present

## 2020-12-15 DIAGNOSIS — R042 Hemoptysis: Secondary | ICD-10-CM | POA: Diagnosis not present

## 2020-12-15 DIAGNOSIS — I2510 Atherosclerotic heart disease of native coronary artery without angina pectoris: Secondary | ICD-10-CM | POA: Diagnosis present

## 2020-12-15 DIAGNOSIS — I482 Chronic atrial fibrillation, unspecified: Secondary | ICD-10-CM

## 2020-12-15 DIAGNOSIS — E860 Dehydration: Secondary | ICD-10-CM | POA: Diagnosis present

## 2020-12-15 DIAGNOSIS — K7460 Unspecified cirrhosis of liver: Secondary | ICD-10-CM | POA: Diagnosis present

## 2020-12-15 DIAGNOSIS — D696 Thrombocytopenia, unspecified: Secondary | ICD-10-CM | POA: Diagnosis not present

## 2020-12-15 DIAGNOSIS — G2581 Restless legs syndrome: Secondary | ICD-10-CM | POA: Diagnosis present

## 2020-12-15 DIAGNOSIS — G4733 Obstructive sleep apnea (adult) (pediatric): Secondary | ICD-10-CM | POA: Diagnosis present

## 2020-12-15 DIAGNOSIS — I130 Hypertensive heart and chronic kidney disease with heart failure and stage 1 through stage 4 chronic kidney disease, or unspecified chronic kidney disease: Secondary | ICD-10-CM | POA: Diagnosis present

## 2020-12-15 DIAGNOSIS — N1832 Chronic kidney disease, stage 3b: Secondary | ICD-10-CM | POA: Diagnosis present

## 2020-12-15 DIAGNOSIS — Z20822 Contact with and (suspected) exposure to covid-19: Secondary | ICD-10-CM | POA: Diagnosis present

## 2020-12-15 DIAGNOSIS — D539 Nutritional anemia, unspecified: Secondary | ICD-10-CM | POA: Diagnosis not present

## 2020-12-15 LAB — CBC
HCT: 20.2 % — ABNORMAL LOW (ref 39.0–52.0)
Hemoglobin: 7 g/dL — ABNORMAL LOW (ref 13.0–17.0)
MCH: 36.3 pg — ABNORMAL HIGH (ref 26.0–34.0)
MCHC: 34.7 g/dL (ref 30.0–36.0)
MCV: 104.7 fL — ABNORMAL HIGH (ref 80.0–100.0)
Platelets: 39 10*3/uL — ABNORMAL LOW (ref 150–400)
RBC: 1.93 MIL/uL — ABNORMAL LOW (ref 4.22–5.81)
RDW: 20.9 % — ABNORMAL HIGH (ref 11.5–15.5)
WBC: 1.6 10*3/uL — ABNORMAL LOW (ref 4.0–10.5)
nRBC: 1.3 % — ABNORMAL HIGH (ref 0.0–0.2)

## 2020-12-15 LAB — BASIC METABOLIC PANEL
Anion gap: 7 (ref 5–15)
BUN: 37 mg/dL — ABNORMAL HIGH (ref 8–23)
CO2: 26 mmol/L (ref 22–32)
Calcium: 9 mg/dL (ref 8.9–10.3)
Chloride: 103 mmol/L (ref 98–111)
Creatinine, Ser: 2.03 mg/dL — ABNORMAL HIGH (ref 0.61–1.24)
GFR, Estimated: 32 mL/min — ABNORMAL LOW (ref >60)
Glucose, Bld: 111 mg/dL — ABNORMAL HIGH (ref 70–99)
Potassium: 4.6 mmol/L (ref 3.5–5.1)
Sodium: 136 mmol/L (ref 135–145)

## 2020-12-15 LAB — HEMOGLOBIN AND HEMATOCRIT, BLOOD
HCT: 21.9 % — ABNORMAL LOW (ref 39.0–52.0)
Hemoglobin: 7.7 g/dL — ABNORMAL LOW (ref 13.0–17.0)

## 2020-12-15 LAB — BPAM PLATELET PHERESIS
Blood Product Expiration Date: 202207262359
ISSUE DATE / TIME: 202207240849
Unit Type and Rh: 9500

## 2020-12-15 LAB — PREPARE RBC (CROSSMATCH)

## 2020-12-15 LAB — PREPARE PLATELET PHERESIS: Unit division: 0

## 2020-12-15 LAB — PROCALCITONIN: Procalcitonin: <0.1 ng/mL

## 2020-12-15 LAB — VITAMIN B12: Vitamin B-12: 165 pg/mL — ABNORMAL LOW (ref 180–914)

## 2020-12-15 MED ORDER — SODIUM CHLORIDE 0.9% IV SOLUTION
Freq: Once | INTRAVENOUS | Status: AC
  Filled 2020-12-15: qty 250

## 2020-12-15 MED ORDER — CYANOCOBALAMIN 1000 MCG/ML IJ SOLN
1000.0000 ug | Freq: Every day | INTRAMUSCULAR | Status: AC
  Administered 2020-12-15 – 2020-12-17 (×3): 1000 ug via INTRAMUSCULAR
  Filled 2020-12-15 (×3): qty 1

## 2020-12-15 NOTE — Progress Notes (Signed)
PROGRESS NOTE    [name]  DTO:[id number] DOB: [birth year] DOA: 12/14/2020 PCP: Birdie Sons, MD   Brief Narrative:  HPI: Vincent Mason is a 85 y.o. male with medical history significant for atrial fibrillation on chronic anticoagulation therapy with Coumadin, COPD, hypertension with stage III chronic kidney disease, dyslipidemia who presents to the ER for evaluation of hemoptysis.  Patient states that for the last 2 weeks he usually coughs up a small amount of dark blood fasting in the morning but over the last 24 hours he notes that he has had an increase in the amount of blood he coughs up.  He said it was initially a teaspoonful but one day prior to his admission he coughed up a little bit more than that which was concerning to him. He had contacted his primary care provider who decreased his dose of Eliquis and ordered a chest x-ray.  He is unsure of what the results of the x-ray showed. He complains of shortness of breath with exertion but denies having any lower extremity swelling or orthopnea. He denies having any chest pain, no fever, no chills, no abdominal pain, no changes in his bowel habits, no dizziness, no lightheadedness, no palpitations, no diaphoresis, no blurred vision no focal deficits. He denies having any NSAID use and stools have been brown in color.  Denies having any hematuria, hematochezia, hematemesis or melena. Labs show sodium 139, potassium 4.3, chloride 106, bicarb 23, glucose 125, BUN 40, creatinine 2.19, calcium 9.0, alkaline phosphatase 45, albumin 3.7, AST 17, ALT 11, total protein 6.7, total bilirubin 0.9, procalcitonin 0.12, white count 1.9, hemoglobin 6.0, hematocrit 17.8, MCV 110, RDW 18.9, platelet count 48,000 Respiratory viral panel is negative CT scan of the chest without contrast shows patchy ground-glass opacities in both lungs with superimposed areas of more confluent nodular soft tissue density at the bases. Imaging features likely related to  infectious/inflammatory etiology and atypical infection would be a consideration. Follow-up CT in 3 months recommended to ensure resolution. Diffuse bronchial wall thickening with cylindrical bronchiectasis bilaterally.Interval decrease in size of the left adrenal lipoma or myelolipoma.Non obstructing right renal stone. Aortic Atherosclerosis (ICD10-I70.0).     ED Course: Patient is an 85 year old Caucasian male who presents to the ER for evaluation of a 2-week history of hemoptysis and is found to have pancytopenia. Patient is scheduled to be transfused 2 units of packed RBC. CT scan of the chest without contrast shows patchy groundglass opacities in both lungs with superimposed areas of more confluent nodular soft tissue density at the bases. He will be admitted to the hospital for further evaluation.  Assessment & Plan:   Principal Problem:   Cough with hemoptysis Active Problems:   Chronic kidney disease (CKD), stage III (moderate) (HCC)   Chronic atrial fibrillation (HCC)   Essential hypertension   Acute on chronic systolic CHF (congestive heart failure) (HCC)   Pancytopenia (HCC)   Vitamin B12 deficiency  Cough with hemoptysis: CT scan of the chest shows groundglass opacities in both lungs with superimposed areas of more confluent nodular soft tissue density at the bases.  Imaging features likely related to infectious/inflammatory etiology and atypical infection would be a consideration.  This could very well be alveolar hemorrhage in the setting of thrombocytopenia. Continue to hold apixaban.  Pulmonology Dr. Humphrey Rolls was consulted yesterday via secure chat by admitted but admitted had not received response back for the acknowledgment.  Personally spoke to Dr. Humphrey Rolls about this consult who is going to see  this patient today.   chronic systolic CHF: No chest x-ray finding of CHF, BNP only slightly elevated and he is not hypoxic.  Patient has rhonchi at the bases bilaterally but that is  likely due to elevated hemorrhages.  No need for IV Lasix.  Discontinue that.  Takes Demadex at home only as needed.  No indication at this point in time.Continue metoprolol, spironolactone. Patient's last 2D echocardiogram from 2020 shows an LVEF of 40%.  CKD stage IIIb: At baseline.  Monitor.   Pancytopenia: Patient noted to have pancytopenia with a hemoglobin of 6.0g/dl, platelet count of 48,000 and white cell count of 1.9.  Unclear etiology.  Patient had received 1 unit of PRBC transfusion, hemoglobin improved to 7.0.  Will transfuse 1 unit of PRBC again today.  1 unit of platelets were also ordered by ED physician.  Patient has no evidence of active bleeding and currently platelets are 39, no indication of platelet transfusion.  We will discontinue that order.  Hematology/oncology on board.  They plan to do bone marrow biopsy tomorrow.   Coronary artery disease: Asymptomatic Status post CABG Continue rosuvastatin and metoprolol   Chronic atrial fibrillation Continue metoprolol for rate control.  Rates controlled. Hold apixaban.  DVT prophylaxis: SCDs Start: 12/14/20 1303   Code Status: Full Code  Family Communication:  None present at bedside.  Plan of care discussed with patient in length and he verbalized understanding and agreed with it.  Status is: Inpatient  Remains inpatient appropriate because:Ongoing diagnostic testing needed not appropriate for outpatient work up  Dispo: The patient is from: Home              Anticipated d/c is to: Home              Patient currently is not medically stable to d/c.   Difficult to place patient No        Estimated body mass index is 25.83 kg/m as calculated from the following:   Height as of this encounter: 5' 10"  (1.778 m).   Weight as of this encounter: 81.6 kg.      Nutritional status:               Consultants:  Oncology and pulmonology  Procedures:  None  Antimicrobials:  Anti-infectives (From admission,  onward)    None          Subjective: Patient seen and examined.  He feels much better.  No shortness of breath.  Has not had any hemoptysis since admission.  Objective: Vitals:   12/15/20 0800 12/15/20 0830 12/15/20 0900 12/15/20 0930  BP: (!) 103/45 (!) 91/56 123/73 101/62  Pulse: 78 82 (!) 130 90  Resp: (!) 23 (!) 24 19   Temp:  98.5 F (36.9 C)    TempSrc:  Oral    SpO2: 99% 100% 99% 100%  Weight:      Height:        Intake/Output Summary (Last 24 hours) at 12/15/2020 0943 Last data filed at 12/14/2020 1629 Gross per 24 hour  Intake 320 ml  Output --  Net 320 ml   Filed Weights   12/14/20 0901  Weight: 81.6 kg    Examination:  General exam: Appears calm and comfortable  Respiratory system: Rhonchi bibasilar, respiratory effort normal. Cardiovascular system: S1 & S2 heard, RRR. No JVD, murmurs, rubs, gallops or clicks. No pedal edema. Gastrointestinal system: Abdomen is nondistended, soft and nontender. No organomegaly or masses felt. Normal bowel sounds heard. Central nervous  system: Alert and oriented. No focal neurological deficits. Extremities: Symmetric 5 x 5 power. Skin: No rashes, lesions or ulcers Psychiatry: Judgement and insight appear normal. Mood & affect appropriate.    Data Reviewed: I have personally reviewed following labs and imaging studies  CBC: Recent Labs  Lab 12/14/20 0911 12/15/20 0617  WBC 1.9* 1.6*  HGB 6.0* 7.0*  HCT 17.8* 20.2*  MCV 110.6* 104.7*  PLT 48* 39*   Basic Metabolic Panel: Recent Labs  Lab 12/14/20 0911 12/15/20 0617  NA 139 136  K 4.3 4.6  CL 106 103  CO2 23 26  GLUCOSE 125* 111*  BUN 40* 37*  CREATININE 2.19* 2.03*  CALCIUM 9.0 9.0   GFR: Estimated Creatinine Clearance: 28.5 mL/min (A) (by C-G formula based on SCr of 2.03 mg/dL (H)). Liver Function Tests: Recent Labs  Lab 12/14/20 0911  AST 17  ALT 11  ALKPHOS 45  BILITOT 0.9  PROT 6.7  ALBUMIN 3.7   No results for input(s): LIPASE,  AMYLASE in the last 168 hours. No results for input(s): AMMONIA in the last 168 hours. Coagulation Profile: Recent Labs  Lab 12/14/20 1122  INR 1.2   Cardiac Enzymes: No results for input(s): CKTOTAL, CKMB, CKMBINDEX, TROPONINI in the last 168 hours. BNP (last 3 results) No results for input(s): PROBNP in the last 8760 hours. HbA1C: No results for input(s): HGBA1C in the last 72 hours. CBG: No results for input(s): GLUCAP in the last 168 hours. Lipid Profile: No results for input(s): CHOL, HDL, LDLCALC, TRIG, CHOLHDL, LDLDIRECT in the last 72 hours. Thyroid Function Tests: No results for input(s): TSH, T4TOTAL, FREET4, T3FREE, THYROIDAB in the last 72 hours. Anemia Panel: Recent Labs    12/14/20 0911 12/14/20 1246 12/14/20 1938  VITAMINB12  --   --  165*  FOLATE  --   --  11.2  TIBC  --  207*  --   IRON  --  93  --   RETICCTPCT 1.6  --   --    Sepsis Labs: Recent Labs  Lab 12/14/20 0911 12/15/20 0617  PROCALCITON 0.12 <0.10    Recent Results (from the past 240 hour(s))  Resp Panel by RT-PCR (Flu A&B, Covid) Nasopharyngeal Swab     Status: None   Collection Time: 12/14/20 11:22 AM   Specimen: Nasopharyngeal Swab; Nasopharyngeal(NP) swabs in vial transport medium  Result Value Ref Range Status   SARS Coronavirus 2 by RT PCR NEGATIVE NEGATIVE Final    Comment: (NOTE) SARS-CoV-2 target nucleic acids are NOT DETECTED.  The SARS-CoV-2 RNA is generally detectable in upper respiratory specimens during the acute phase of infection. The lowest concentration of SARS-CoV-2 viral copies this assay can detect is 138 copies/mL. A negative result does not preclude SARS-Cov-2 infection and should not be used as the sole basis for treatment or other patient management decisions. A negative result may occur with  improper specimen collection/handling, submission of specimen other than nasopharyngeal swab, presence of viral mutation(s) within the areas targeted by this assay, and  inadequate number of viral copies(<138 copies/mL). A negative result must be combined with clinical observations, patient history, and epidemiological information. The expected result is Negative.  Fact Sheet for Patients:  EntrepreneurPulse.com.au  Fact Sheet for Healthcare Providers:  IncredibleEmployment.be  This test is no t yet approved or cleared by the [place] FDA and  has been authorized for detection and/or diagnosis of SARS-CoV-2 by FDA under an Emergency Use Authorization (EUA). This EUA will remain  in effect (  meaning this test can be used) for the duration of the COVID-19 declaration under Section 564(b)(1) of the Act, 21 U.S.C.section 360bbb-3(b)(1), unless the authorization is terminated  or revoked sooner.       Influenza A by PCR NEGATIVE NEGATIVE Final   Influenza B by PCR NEGATIVE NEGATIVE Final    Comment: (NOTE) The Xpert Xpress SARS-CoV-2/FLU/RSV plus assay is intended as an aid in the diagnosis of influenza from Nasopharyngeal swab specimens and should not be used as a sole basis for treatment. Nasal washings and aspirates are unacceptable for Xpert Xpress SARS-CoV-2/FLU/RSV testing.  Fact Sheet for Patients: EntrepreneurPulse.com.au  Fact Sheet for Healthcare Providers: IncredibleEmployment.be  This test is not yet approved or cleared by the [place] FDA and has been authorized for detection and/or diagnosis of SARS-CoV-2 by FDA under an Emergency Use Authorization (EUA). This EUA will remain in effect (meaning this test can be used) for the duration of the COVID-19 declaration under Section 564(b)(1) of the Act, 21 U.S.C. section 360bbb-3(b)(1), unless the authorization is terminated or revoked.  Performed at [facility], [address] [place], [place] [address]       Radiology Studies: CT Chest Wo Contrast  Result Date: 12/14/2020 CLINICAL DATA:   Hemoptysis. EXAM: CT CHEST WITHOUT CONTRAST TECHNIQUE: Multidetector CT imaging of the chest was performed following the standard protocol without IV contrast. COMPARISON:  08/30/2011 FINDINGS: Cardiovascular: Heart is enlarged. Status post CABG. Mild atherosclerotic calcification is noted in the wall of the thoracic aorta. Mediastinum/Nodes: Scattered small mediastinal lymph nodes evident. No evidence for gross hilar lymphadenopathy although assessment is limited by the lack of intravenous contrast on today's study. The esophagus has normal imaging features. There is no axillary lymphadenopathy. Lungs/Pleura: Patchy ground-glass opacities identified in both lungs. Ground-glass disease in the lung bases has superimposed areas of more confluent nodular soft tissue density (including 12 mm paraspinal right lower lobe on image 114/2 and 2.3 cm posterior left lower lobe on 01/20 8/2. Diffuse bronchial wall thickening with cylindrical bronchiectasis noted bilaterally. No pleural effusion. No pneumothorax. Upper Abdomen: 5 mm nonobstructing stone noted upper pole right kidney. 2.7 cm left adrenal lipoma or myelolipoma has decreased from 3.5 cm previously. Musculoskeletal: No worrisome lytic or sclerotic osseous abnormality. Bilateral gynecomastia. Evidence of vertebral augmentation at L1. IMPRESSION: 1. Patchy ground-glass opacities in both lungs with superimposed areas of more confluent nodular soft tissue density at the bases. Imaging features likely related to infectious/inflammatory etiology and atypical infection would be a consideration. Follow-up CT in 3 months recommended to ensure resolution. 2. Diffuse bronchial wall thickening with cylindrical bronchiectasis bilaterally. 3. Interval decrease in size of the left adrenal lipoma or myelolipoma. 4. Nonobstructing right renal stone. 5. Aortic Atherosclerosis (ICD10-I70.0). Electronically Signed   By: Misty Stanley M.D.   On: 12/14/2020 10:52   US Abdomen  Complete  Result Date: 12/14/2020 CLINICAL DATA:  Pancytopenia EXAM: ABDOMEN ULTRASOUND COMPLETE COMPARISON:  November 14, 2015. FINDINGS: Gallbladder: No gallstones or wall thickening visualized. No sonographic Murphy sign noted by sonographer. Common bile duct: Diameter: 3 mm Liver: No focal lesion identified. Mildly heterogeneous liver echotexture. Revisualization of a mildly nodular contour of the liver similar comparison to prior. Portal vein is patent on color Doppler imaging with normal direction of blood flow towards the liver. IVC: No abnormality visualized. Pancreas: Visualized portion unremarkable. Spleen: Size and appearance within normal limits. Right Kidney: Length: 9.9 cm. Echogenicity within normal limits. No mass or hydronephrosis visualized. Left Kidney: Length: 11.9 cm. Echogenicity  within normal limits. No mass or hydronephrosis visualized. Abdominal aorta: Limited assessment secondary to shadowing bowel gas of the distal aorta. Within these limitations, no aneurysm visualized proximally. Other findings: None. IMPRESSION: 1. No sonographic evidence of splenomegaly. 2. Mildly nodular contour of the liver with heterogeneous echotexture may reflect underlying cirrhosis. No focal lesion identified. Electronically Signed   By: Valentino Saxon MD   On: 12/14/2020 14:14    Scheduled Meds:  sodium chloride   Intravenous Once   allopurinol  200 mg Oral Daily   cholecalciferol  5,000 Units Oral Daily   cyanocobalamin  1,000 mcg Intramuscular Daily   ferrous sulfate  325 mg Oral Daily   furosemide  40 mg Intravenous Daily   gabapentin  300 mg Oral BID   metoprolol succinate  12.5 mg Oral Daily   rosuvastatin  10 mg Oral Daily   sodium chloride flush  3 mL Intravenous Q12H   spironolactone  25 mg Oral Daily   Continuous Infusions:  sodium chloride     sodium chloride       LOS: 0 days   Time spent: 39 minutes   Darliss Cheney, MD [facility]  12/15/2020, 9:43 AM   How to  contact the [facility] Attending or Consulting provider [place] or covering provider during after hours [place], for this patient?  Check the care team in [facility] and look for a) attending/consulting [facility] provider listed and b) the [facility] team listed. Page or secure chat [phone]. Log into [email or web address] and use 's universal password to access. If you do not have the password, please contact the hospital operator. Locate the [facility] provider you are looking for under [facility] and page to a number that you can be directly reached. If you still have difficulty reaching the provider, please page the [facility] (Director on Call) for the Hospitalists listed on amion for assistance.

## 2020-12-15 NOTE — Plan of Care (Signed)
Pt admitted today from the ED. pRBC's unit transfused without complications.  Hgb 7.7 post transfusion.  Pt is A&ox4, pleasant. Ate well.

## 2020-12-15 NOTE — Consult Note (Signed)
Pulmonary Critical Care  Initial Consult Note  [name] PPJ:[id number] DOB: May 10, 1936 DOA: 12/14/2020  Referring physician: Dr. Posey Pronto  Chief Complaint: Hemoptysis  HPI: Vincent Mason is a 85 y.o. male with multiple medical problems including congestive heart failure atrial fibrillation on chronic anticoagulation with Coumadin.  Patient also has a history of COPD hypertension stage III chronic kidney disease hyperlipidemia came into the hospital because he was apparently coughing up some blood.  Patient had a CT scan of the chest done which shows diffuse groundglass opacities with a pattern that is likely consistent with alveolar hemorrhage.  This is in the setting of a patient who is anticoagulated and also had low platelet count.  Should be noted that patient also had a low hemoglobin and was transferred since being here.  When trying to take his history he is not very clear about his symptoms.  In fact he was downplaying and stated that he was not coughing up any blood and he had some sort of blood problem for which he received 2 units of blood.  Indeed on review of the chart he does have a history of atrial fibrillation has a history of CHF is followed by Dr. Nehemiah Massed for his cardiac issues.  Patient had an echocardiogram done back in 2018 that is on record here which showed an EF of about 45 to 50%.  He currently denies having any chest pain there is no palpitations.  Denies having any fever no chills.  No nausea no vomiting.  He has not had any hematemesis.  No blood in the stools.  Review of Systems:  Constitutional:  No weight loss, night sweats, Fevers, chills, fatigue.  HEENT:  No headaches, nasal congestion, post nasal drip,  Cardio-vascular:  No chest pain, Orthopnea, PND, swelling in lower extremities, anasarca, dizziness, palpitations  GI:  No heartburn, indigestion, abdominal pain, nausea, vomiting, diarrhea  Resp:  No shortness of breath with exertion or at rest. no productive  cough, No coughing up of blood.No wheezing Skin:  no rash or lesions.  Musculoskeletal:  No joint pain or swelling.   Remainder ROS performed and is unremarkable other than noted in HPI  Past Medical History:  Diagnosis Date   Acute encephalopathy 09/03/2015   Atrial fibrillation and flutter [facility]) 2013   Dr Nehemiah Massed at [facility]  on [address].    C. difficile colitis 09/12/2015   COPD (chronic obstructive pulmonary disease) (HCC)    HCAP (healthcare-associated pneumonia) 09/12/2015   Hyperlipidemia    Hypertension    Kidney disease, chronic, stage III (moderate, EGFR 30-59 ml/min) ([place])    ARF 2013   OSA (obstructive sleep apnea)    non-compliant with CPAP.    Severe sepsis with septic shock ([place]) 09/12/2015   Past Surgical History:  Procedure Laterality Date   CATARACT EXTRACTION     CORONARY ARTERY BYPASS GRAFT  10/1997   EYE SURGERY     history of cervical discectomy  2009   C5-C6   Hyperplastic colon polyp  02/2002   Sigmoid polyps   KYPHOPLASTY N/A 02/06/2020   Procedure: T12 & L3 Kyphoplasty;  Surgeon: Hessie Knows, MD;  Location: [facility] ORS;  Service: Orthopedics;  Laterality: N/A;   Social History:  reports that he has quit smoking. He has never used smokeless tobacco. He reports that he does not drink alcohol and does not use drugs.  No Known Allergies  Family History  Problem Relation Age of Onset   Hypertension Mother    Hypertension  Father     Prior to Admission medications   Medication Sig Start Date End Date Taking? Authorizing Provider  allopurinol (ZYLOPRIM) 100 MG tablet Take 2 tablets (200 mg total) by mouth daily. 02/15/20  Yes Birdie Sons, MD  apixaban (ELIQUIS) 5 MG TABS tablet Take 1/2 tablet twice a day Patient taking differently: Take 2.5 mg by mouth 2 (two) times daily. 06/22/18  Yes Birdie Sons, MD  Cholecalciferol (VITAMIN D-3) 125 MCG (5000 UT) TABS Take 5,000 Units by mouth daily.   Yes [provider]  ferrous sulfate 325 (65 FE)  MG tablet Take 1 tablet (325 mg total) by mouth daily. 06/22/18  Yes Birdie Sons, MD  gabapentin (NEURONTIN) 300 MG capsule Take 1 capsule (300 mg total) by mouth 2 (two) times daily. 02/29/20  Yes Birdie Sons, MD  oxyCODONE (ROXICODONE) 15 MG immediate release tablet TAKE 1 TABLET BY MOUTH EVERY 4 HOURS AS NEEDED Patient taking differently: Take 15 mg by mouth every 4 (four) hours as needed for pain. 12/12/20  Yes Birdie Sons, MD  polyethylene glycol [facility] / GLYCOLAX) packet Take 17 g by mouth daily. Patient taking differently: Take 17 g by mouth daily as needed for mild constipation. 12/08/16  Yes Gladstone Lighter, MD  PROCTOZONE-HC 2.5 % rectal cream PLACE 1 APPLICATION RECTALLY TWICE DAILYAS DIRECTED Patient taking differently: Place 1 application rectally 2 (two) times daily as needed (discomfort/hemorrhoids). 10/16/19  Yes Chrismon, Vickki Muff, PA-C  rosuvastatin (CRESTOR) 10 MG tablet TAKE 1 TABLET BY MOUTH ONCE DAILY 11/05/20  Yes Birdie Sons, MD  torsemide (DEMADEX) 20 MG tablet Take one daily as needed for excessive swelling Patient taking differently: Take 20 mg by mouth daily. 06/22/18  Yes Birdie Sons, MD  metoprolol succinate (TOPROL-XL) 25 MG 24 hr tablet TAKE 1/2 A TABLET BY MOUTH DAILY Patient taking differently: Take 12.5 mg by mouth daily. 01/04/20   Birdie Sons, MD  spironolactone (ALDACTONE) 25 MG tablet Take 25 mg by mouth daily.    [provider]   Physical Exam: Vitals:   12/15/20 0959 12/15/20 1100 12/15/20 1134 12/15/20 1314  BP: 117/66 125/69 127/88 108/60  Pulse: 95 95 93 86  Resp: 16 (!) 27 17 18   Temp: 98.2 F (36.8 C) 98.3 F (36.8 C) 98.2 F (36.8 C) 98.4 F (36.9 C)  TempSrc: Oral Oral Oral Oral  SpO2: 99% 100% 100% 99%  Weight:      Height:        Wt Readings from Last 3 Encounters:  12/14/20 81.6 kg  11/29/20 81 kg  04/02/20 74.4 kg    General:  Appears calm and comfortable Eyes: PERRL, normal lids, irises &  conjunctiva ENT: grossly normal hearing, lips & tongue Neck: no LAD, masses or thyromegaly Cardiovascular: RRR, no m/r/g. No LE edema. Respiratory: CTA bilaterally, no w/r/r.       Normal respiratory effort. Abdomen: soft, nontender Skin: no rash or induration seen on limited exam Musculoskeletal: grossly normal tone BUE/BLE Psychiatric: grossly normal mood and affect Neurologic: grossly non-focal.          Labs on Admission:  Basic Metabolic Panel: Recent Labs  Lab 12/14/20 0911 12/15/20 0617  NA 139 136  K 4.3 4.6  CL 106 103  CO2 23 26  GLUCOSE 125* 111*  BUN 40* 37*  CREATININE 2.19* 2.03*  CALCIUM 9.0 9.0   Liver Function Tests: Recent Labs  Lab 12/14/20 0911  AST 17  ALT 11  ALKPHOS 45  BILITOT 0.9  PROT 6.7  ALBUMIN 3.7   No results for input(s): LIPASE, AMYLASE in the last 168 hours. No results for input(s): AMMONIA in the last 168 hours. CBC: Recent Labs  Lab 12/14/20 0911 12/15/20 0617  WBC 1.9* 1.6*  HGB 6.0* 7.0*  HCT 17.8* 20.2*  MCV 110.6* 104.7*  PLT 48* 39*   Cardiac Enzymes: No results for input(s): CKTOTAL, CKMB, CKMBINDEX, TROPONINI in the last 168 hours.  BNP (last 3 results) Recent Labs    12/14/20 1245  BNP 100.4*    ProBNP (last 3 results) No results for input(s): PROBNP in the last 8760 hours.  CBG: No results for input(s): GLUCAP in the last 168 hours.  Radiological Exams on Admission: CT Chest Wo Contrast  Result Date: 12/14/2020 CLINICAL DATA:  Hemoptysis. EXAM: CT CHEST WITHOUT CONTRAST TECHNIQUE: Multidetector CT imaging of the chest was performed following the standard protocol without IV contrast. COMPARISON:  08/30/2011 FINDINGS: Cardiovascular: Heart is enlarged. Status post CABG. Mild atherosclerotic calcification is noted in the wall of the thoracic aorta. Mediastinum/Nodes: Scattered small mediastinal lymph nodes evident. No evidence for gross hilar lymphadenopathy although assessment is limited by the lack of  intravenous contrast on today's study. The esophagus has normal imaging features. There is no axillary lymphadenopathy. Lungs/Pleura: Patchy ground-glass opacities identified in both lungs. Ground-glass disease in the lung bases has superimposed areas of more confluent nodular soft tissue density (including 12 mm paraspinal right lower lobe on image 114/2 and 2.3 cm posterior left lower lobe on 01/20 8/2. Diffuse bronchial wall thickening with cylindrical bronchiectasis noted bilaterally. No pleural effusion. No pneumothorax. Upper Abdomen: 5 mm nonobstructing stone noted upper pole right kidney. 2.7 cm left adrenal lipoma or myelolipoma has decreased from 3.5 cm previously. Musculoskeletal: No worrisome lytic or sclerotic osseous abnormality. Bilateral gynecomastia. Evidence of vertebral augmentation at L1. IMPRESSION: 1. Patchy ground-glass opacities in both lungs with superimposed areas of more confluent nodular soft tissue density at the bases. Imaging features likely related to infectious/inflammatory etiology and atypical infection would be a consideration. Follow-up CT in 3 months recommended to ensure resolution. 2. Diffuse bronchial wall thickening with cylindrical bronchiectasis bilaterally. 3. Interval decrease in size of the left adrenal lipoma or myelolipoma. 4. Nonobstructing right renal stone. 5. Aortic Atherosclerosis (ICD10-I70.0). Electronically Signed   By: Misty Stanley M.D.   On: 12/14/2020 10:52   US Abdomen Complete  Result Date: 12/14/2020 CLINICAL DATA:  Pancytopenia EXAM: ABDOMEN ULTRASOUND COMPLETE COMPARISON:  November 14, 2015. FINDINGS: Gallbladder: No gallstones or wall thickening visualized. No sonographic Murphy sign noted by sonographer. Common bile duct: Diameter: 3 mm Liver: No focal lesion identified. Mildly heterogeneous liver echotexture. Revisualization of a mildly nodular contour of the liver similar comparison to prior. Portal vein is patent on color Doppler imaging with  normal direction of blood flow towards the liver. IVC: No abnormality visualized. Pancreas: Visualized portion unremarkable. Spleen: Size and appearance within normal limits. Right Kidney: Length: 9.9 cm. Echogenicity within normal limits. No mass or hydronephrosis visualized. Left Kidney: Length: 11.9 cm. Echogenicity within normal limits. No mass or hydronephrosis visualized. Abdominal aorta: Limited assessment secondary to shadowing bowel gas of the distal aorta. Within these limitations, no aneurysm visualized proximally. Other findings: None. IMPRESSION: 1. No sonographic evidence of splenomegaly. 2. Mildly nodular contour of the liver with heterogeneous echotexture may reflect underlying cirrhosis. No focal lesion identified. Electronically Signed   By: Valentino Saxon MD   On: 12/14/2020 14:14  EKG: Independently reviewed.  Assessment/Plan Principal Problem:   Cough with hemoptysis Active Problems:   Chronic kidney disease (CKD), stage III (moderate) (HCC)   Chronic atrial fibrillation (HCC)   Essential hypertension   Acute on chronic systolic CHF (congestive heart failure) (HCC)   Pancytopenia (HCC)   Vitamin B12 deficiency   Hemoptysis multifactorial he has a baseline history of anticoagulation on Coumadin therapy.  In addition to that now he is noted to have low platelet count.  This in the combination with the being on anticoagulation would certainly pose a risk for him to have alveolar hemorrhage.  At this point I would recommend work-up for the platelet count.  Also would recommend work-up for his cardiac issues.  Last echocardiogram that is on file here was in 2018 I suspect that he may have had a decline in his EF since the last 4 years possibly and so this would warrant follow-up.  Cardiology can also be asked to see the patient regarding choice of anticoagulation.  From a pulmonary perspective hold off on any invasive diagnostic testing at this time until the platelet issue is  resolved I suspect that once the platelet issue and the anticoagulation issue is addressed we should see an improvement in his hemoptysis if not then we can proceed with further diagnostic evaluation. Thrombocytopenia will need work-up apparently is scheduled for a bone marrow biopsy the patient's hematologist will follow up on that. Chronic heart failure reduced ejection fraction patient has a history of CHF dating back to 2018 based on the echocardiogram indeed he has had echocardiogram prior to that showing low EF also.  I would suggest a follow-up echocardiogram and also follow-up with cardiology recommendation.  Code Status: Full code  Family Communication: Home Disposition Plan: No family present  Time spent: 70 minutes  I have personally obtained a history, examined the patient, evaluated laboratory and imaging results, formulated the assessment and plan and placed orders.  The Patient requires high complexity decision making for assessment and support. Total Time Spent 43mn    Thank you for consulting me in the care of this patient I would be happy to see the patient in the office after patient is discharged or if any further input is required  SAllyne Gee MD FKerlan Jobe Surgery Center LLCPulmonary Critical Care Medicine Sleep Medicine

## 2020-12-15 NOTE — ED Notes (Signed)
MD Pahwani at bedside. He advised NOT to give the platelets. He ordered one additional unit of RBCs to be given today.

## 2020-12-16 ENCOUNTER — Telehealth: Payer: Self-pay

## 2020-12-16 ENCOUNTER — Inpatient Hospital Stay: Payer: Medicare Other

## 2020-12-16 DIAGNOSIS — R042 Hemoptysis: Secondary | ICD-10-CM | POA: Diagnosis not present

## 2020-12-16 LAB — CBC WITH DIFFERENTIAL/PLATELET
Abs Immature Granulocytes: 0.06 10*3/uL (ref 0.00–0.07)
Basophils Absolute: 0 10*3/uL (ref 0.0–0.1)
Basophils Relative: 0 %
Eosinophils Absolute: 0 10*3/uL (ref 0.0–0.5)
Eosinophils Relative: 0 %
HCT: 23.3 % — ABNORMAL LOW (ref 39.0–52.0)
Hemoglobin: 8 g/dL — ABNORMAL LOW (ref 13.0–17.0)
Immature Granulocytes: 2 %
Lymphocytes Relative: 50 %
Lymphs Abs: 1.3 10*3/uL (ref 0.7–4.0)
MCH: 34.6 pg — ABNORMAL HIGH (ref 26.0–34.0)
MCHC: 34.3 g/dL (ref 30.0–36.0)
MCV: 100.9 fL — ABNORMAL HIGH (ref 80.0–100.0)
Monocytes Absolute: 0.9 10*3/uL (ref 0.1–1.0)
Monocytes Relative: 32 %
Neutro Abs: 0.5 10*3/uL — ABNORMAL LOW (ref 1.7–7.7)
Neutrophils Relative %: 16 %
Platelets: 38 10*3/uL — ABNORMAL LOW (ref 150–400)
RBC: 2.31 MIL/uL — ABNORMAL LOW (ref 4.22–5.81)
RDW: 23.4 % — ABNORMAL HIGH (ref 11.5–15.5)
Smear Review: NORMAL
WBC: 2.7 10*3/uL — ABNORMAL LOW (ref 4.0–10.5)
nRBC: 1.9 % — ABNORMAL HIGH (ref 0.0–0.2)

## 2020-12-16 LAB — TYPE AND SCREEN
ABO/RH(D): B POS
Antibody Screen: NEGATIVE
Unit division: 0
Unit division: 0

## 2020-12-16 LAB — BPAM RBC
Blood Product Expiration Date: 202208072359
Blood Product Expiration Date: 202208102359
ISSUE DATE / TIME: 202207231342
ISSUE DATE / TIME: 202207240935
Unit Type and Rh: 1700
Unit Type and Rh: 7300

## 2020-12-16 LAB — KAPPA/LAMBDA LIGHT CHAINS
Kappa free light chain: 37.1 mg/L — ABNORMAL HIGH (ref 3.3–19.4)
Kappa, lambda light chain ratio: 1.81 — ABNORMAL HIGH (ref 0.26–1.65)
Lambda free light chains: 20.5 mg/L (ref 5.7–26.3)

## 2020-12-16 LAB — CBC
HCT: 23.6 % — ABNORMAL LOW (ref 39.0–52.0)
Hemoglobin: 8 g/dL — ABNORMAL LOW (ref 13.0–17.0)
MCH: 34.3 pg — ABNORMAL HIGH (ref 26.0–34.0)
MCHC: 33.9 g/dL (ref 30.0–36.0)
MCV: 101.3 fL — ABNORMAL HIGH (ref 80.0–100.0)
Platelets: 38 10*3/uL — ABNORMAL LOW (ref 150–400)
RBC: 2.33 MIL/uL — ABNORMAL LOW (ref 4.22–5.81)
RDW: 23.4 % — ABNORMAL HIGH (ref 11.5–15.5)
WBC: 2.6 10*3/uL — ABNORMAL LOW (ref 4.0–10.5)
nRBC: 1.1 % — ABNORMAL HIGH (ref 0.0–0.2)

## 2020-12-16 LAB — PATHOLOGIST SMEAR REVIEW

## 2020-12-16 LAB — HAPTOGLOBIN: Haptoglobin: 145 mg/dL (ref 38–329)

## 2020-12-16 LAB — PROCALCITONIN: Procalcitonin: <0.1 ng/mL

## 2020-12-16 MED ORDER — MIDAZOLAM HCL 2 MG/2ML IJ SOLN
INTRAMUSCULAR | Status: AC | PRN
  Administered 2020-12-16 (×2): 0.5 mg via INTRAVENOUS

## 2020-12-16 MED ORDER — FENTANYL CITRATE (PF) 100 MCG/2ML IJ SOLN
INTRAMUSCULAR | Status: AC
  Filled 2020-12-16: qty 2

## 2020-12-16 MED ORDER — HEPARIN SOD (PORK) LOCK FLUSH 100 UNIT/ML IV SOLN
INTRAVENOUS | Status: AC
  Filled 2020-12-16: qty 5

## 2020-12-16 MED ORDER — MIDAZOLAM HCL 2 MG/2ML IJ SOLN
INTRAMUSCULAR | Status: AC
  Filled 2020-12-16: qty 2

## 2020-12-16 MED ORDER — GABAPENTIN 100 MG PO CAPS
200.0000 mg | ORAL_CAPSULE | Freq: Two times a day (BID) | ORAL | Status: DC
  Administered 2020-12-16 – 2020-12-17 (×2): 200 mg via ORAL
  Filled 2020-12-16 (×2): qty 2

## 2020-12-16 MED ORDER — FENTANYL CITRATE (PF) 100 MCG/2ML IJ SOLN
INTRAMUSCULAR | Status: AC | PRN
  Administered 2020-12-16 (×2): 25 ug via INTRAVENOUS

## 2020-12-16 NOTE — Procedures (Signed)
Interventional Radiology Procedure:   Indications: Macrocytic anemia/thrombocytopenia  Procedure: CT guided bone marrow biopsy  Findings: 2 aspirates and 1 core from right ilium  Complications: None     EBL: Minimal, less than 10 ml  Plan: Bedrest 1 hour   Jozette Castrellon R. Anselm Pancoast, MD  Pager: [phone]

## 2020-12-16 NOTE — H&P (Signed)
Chief Complaint: Patient was seen in consultation today for bone marrow aspiration/biopsy.  Referring Physician(s): Faythe Casa, NP  Supervising Physician: Markus Daft  Patient Status: [facility] - In-pt  History of Present Illness: Vincent Mason is a 85 y.o. male with a past medical history significant for COPD, CKD III, HTN HLD, a.fib on Eliquis who presented to [facility] ED on 7/23 with complaints of hemoptysis x 2 weeks. Initial workup noted hgb 6.0, plt 48, WBC 1.9, tachycardia and tachypnea. Oncology was consulted due to macrocytic anemia/thrombocytopenia concerning for high grade MDS. IR has been consulted for bone marrow aspiration/biopsy to further direct care.  Vincent Mason seen in CT today, he is upset because he states he was told he would be completely asleep and feel no pain at all during the procedure - we discussed what moderate sedation entails and that although it is not completely pain free it is typically well tolerated, he is agreeable to attempt procedure with moderate sedation. He reports ongoing hemoptysis which is much less today than when he arrived, he reports having 2 different bags of blood so far that "haven't helped anything." He denies any pain or trouble breathing at rest, he does endorse trouble breathing with walking short distances which is new since arriving to the hospital. He is very hungry and looking forward to eating.  Past Medical History:  Diagnosis Date  . Acute encephalopathy 09/03/2015  . Atrial fibrillation and flutter [facility]) 2013   Dr Nehemiah Massed at [facility]  on [place].   . C. difficile colitis 09/12/2015  . COPD (chronic obstructive pulmonary disease) ([place])   . HCAP (healthcare-associated pneumonia) 09/12/2015  . Hyperlipidemia   . Hypertension   . Kidney disease, chronic, stage III (moderate, EGFR 30-59 ml/min) ([address])    ARF 2013  . OSA (obstructive sleep apnea)    non-compliant with CPAP.   Marland Kitchen Severe sepsis with septic shock ([place]) 09/12/2015    Past  Surgical History:  Procedure Laterality Date  . CATARACT EXTRACTION    . CORONARY ARTERY BYPASS GRAFT  10/1997  . EYE SURGERY    . history of cervical discectomy  2009   C5-C6  . Hyperplastic colon polyp  02/2002   Sigmoid polyps  . KYPHOPLASTY N/A 02/06/2020   Procedure: T12 & L3 Kyphoplasty;  Surgeon: Hessie Knows, MD;  Location: [facility] ORS;  Service: Orthopedics;  Laterality: N/A;    Allergies: Patient has no known allergies.  Medications: Prior to Admission medications   Medication Sig Start Date End Date Taking? Authorizing Provider  allopurinol (ZYLOPRIM) 100 MG tablet Take 2 tablets (200 mg total) by mouth daily. 02/15/20  Yes Birdie Sons, MD  apixaban (ELIQUIS) 5 MG TABS tablet Take 1/2 tablet twice a day Patient taking differently: Take 2.5 mg by mouth 2 (two) times daily. 06/22/18  Yes Birdie Sons, MD  Cholecalciferol (VITAMIN D-3) 125 MCG (5000 UT) TABS Take 5,000 Units by mouth daily.   Yes [provider]  ferrous sulfate 325 (65 FE) MG tablet Take 1 tablet (325 mg total) by mouth daily. 06/22/18  Yes Birdie Sons, MD  gabapentin (NEURONTIN) 300 MG capsule Take 1 capsule (300 mg total) by mouth 2 (two) times daily. 02/29/20  Yes Birdie Sons, MD  oxyCODONE (ROXICODONE) 15 MG immediate release tablet TAKE 1 TABLET BY MOUTH EVERY 4 HOURS AS NEEDED Patient taking differently: Take 15 mg by mouth every 4 (four) hours as needed for pain. 12/12/20  Yes Birdie Sons, MD  polyethylene glycol (MIRALAX / GLYCOLAX) packet Take 17 g by mouth daily. Patient taking differently: Take 17 g by mouth daily as needed for mild constipation. 12/08/16  Yes Gladstone Lighter, MD  PROCTOZONE-HC 2.5 % rectal cream PLACE 1 APPLICATION RECTALLY TWICE DAILYAS DIRECTED Patient taking differently: Place 1 application rectally 2 (two) times daily as needed (discomfort/hemorrhoids). 10/16/19  Yes Chrismon, Vickki Muff, PA-C  rosuvastatin (CRESTOR) 10 MG tablet TAKE 1 TABLET BY MOUTH  ONCE DAILY 11/05/20  Yes Birdie Sons, MD  torsemide (DEMADEX) 20 MG tablet Take one daily as needed for excessive swelling Patient taking differently: Take 20 mg by mouth daily. 06/22/18  Yes Birdie Sons, MD  metoprolol succinate (TOPROL-XL) 25 MG 24 hr tablet TAKE 1/2 A TABLET BY MOUTH DAILY Patient taking differently: Take 12.5 mg by mouth daily. 01/04/20   Birdie Sons, MD  spironolactone (ALDACTONE) 25 MG tablet Take 25 mg by mouth daily.    [provider]     Family History  Problem Relation Age of Onset  . Hypertension Mother   . Hypertension Father     Social History   Socioeconomic History  . Marital status: Married    Spouse name: Not on file  . Number of children: 3  . Years of education: some colle  . Highest education level: Some college, no degree  Occupational History  . Occupation: Retired  Tobacco Use  . Smoking status: Former  . Smokeless tobacco: Never  . Tobacco comments:    Quit in [place] Use  . Vaping Use: Never used  Substance and Sexual Activity  . Alcohol use: No  . Drug use: No  . Sexual activity: Yes  Other Topics Concern  . Not on file  Social History Narrative  . Not on file   Social Determinants of Health   Financial Resource Strain: Low Risk   . Difficulty of Paying Living Expenses: Not hard at all  Food Insecurity: No Food Insecurity  . Worried About Charity fundraiser in the Last Year: Never true  . Ran Out of Food in the Last Year: Never true  Transportation Needs: No Transportation Needs  . Lack of Transportation (Medical): No  . Lack of Transportation (Non-Medical): No  Physical Activity: Inactive  . Days of Exercise per Week: 0 days  . Minutes of Exercise per Session: 0 min  Stress: Stress Concern Present  . Feeling of Stress : Rather much  Social Connections: Socially Isolated  . Frequency of Communication with Friends and Family: Twice a week  . Frequency of Social Gatherings with Friends and  Family: Never  . Attends Religious Services: Never  . Active Member of Clubs or Organizations: No  . Attends Archivist Meetings: Never  . Marital Status: Married     Review of Systems: A 12 point ROS discussed and pertinent positives are indicated in the HPI above.  All other systems are negative.  Review of Systems  Constitutional:  Negative for chills and fever.  Respiratory:  Positive for cough (hemoptysis) and shortness of breath (with exertion only).   Cardiovascular:  Negative for chest pain.  Gastrointestinal:  Negative for abdominal pain, blood in stool, diarrhea, nausea and vomiting.  Genitourinary:  Negative for hematuria.  Musculoskeletal:  Negative for back pain.  Neurological:  Negative for headaches.   Vital Signs: BP (!) 136/91 (BP Location: Right Arm)   Pulse 96   Temp 98 F (36.7 C) (Axillary)   Resp 18  Ht _0  (1.778 m)   Wt 180 lb (81.6 kg)   SpO2 99%   BMI 25.83 kg/m   Physical Exam Vitals and nursing note reviewed.  Constitutional:      General: He is not in acute distress. HENT:     Head: Normocephalic.     Mouth/Throat:     Mouth: Mucous membranes are moist.     Pharynx: Oropharynx is clear. No oropharyngeal exudate or posterior oropharyngeal erythema.  Cardiovascular:     Rate and Rhythm: Normal rate and regular rhythm.  Pulmonary:     Effort: Pulmonary effort is normal.     Breath sounds: Normal breath sounds.  Abdominal:     General: There is no distension.     Palpations: Abdomen is soft.     Tenderness: There is no abdominal tenderness.  Skin:    General: Skin is warm and dry.     Findings: Bruising (at IV sites) present.  Neurological:     Mental Status: He is alert and oriented to person, place, and time.  Psychiatric:        Mood and Affect: Mood normal.        Thought Content: Thought content normal.        Judgment: Judgment normal.     MD Evaluation Airway: WNL Heart: WNL Abdomen: WNL Chest/ Lungs:  WNL ASA  Classification: 3 Mallampati/Airway Score: Two   Imaging: DG Chest 2 View  Result Date: 11/30/2020 CLINICAL DATA:  Recent episodes of hemoptysis EXAM: CHEST - 2 VIEW COMPARISON:  09/29/2017 FINDINGS: Previous coronary bypass changes. Stable cardiomegaly without superimposed CHF or edema. No effusion or pneumothorax. Similar chronic emphysema pattern and basilar interstitial changes/fibrosis. Left upper lobe 10 mm nodular opacity versus nodule projects over the left third anterior rib shadow. This could represent developing nodule versus superimposed shadows/parenchymal scarring. Trachea midline. Aorta atherosclerotic. Bones are osteopenic. No acute osseous finding. IMPRESSION: Stable cardiomegaly without CHF Similar chronic changes of COPD and bibasilar interstitial lung disease/fibrosis Left upper lobe 10 mm nodular opacity versus nodule. Consider short-term follow-up at 3 months and if persist chest CT could be performed. Aortic Atherosclerosis (ICD10-I70.0) and Emphysema (ICD10-J43.9). Electronically Signed   By: Jerilynn Mages.  Shick M.D.   On: 11/30/2020 08:43   CT Chest Wo Contrast  Result Date: 12/14/2020 CLINICAL DATA:  Hemoptysis. EXAM: CT CHEST WITHOUT CONTRAST TECHNIQUE: Multidetector CT imaging of the chest was performed following the standard protocol without IV contrast. COMPARISON:  08/30/2011 FINDINGS: Cardiovascular: Heart is enlarged. Status post CABG. Mild atherosclerotic calcification is noted in the wall of the thoracic aorta. Mediastinum/Nodes: Scattered small mediastinal lymph nodes evident. No evidence for gross hilar lymphadenopathy although assessment is limited by the lack of intravenous contrast on today's study. The esophagus has normal imaging features. There is no axillary lymphadenopathy. Lungs/Pleura: Patchy ground-glass opacities identified in both lungs. Ground-glass disease in the lung bases has superimposed areas of more confluent nodular soft tissue density (including 12  mm paraspinal right lower lobe on image 114/2 and 2.3 cm posterior left lower lobe on 01/20 8/2. Diffuse bronchial wall thickening with cylindrical bronchiectasis noted bilaterally. No pleural effusion. No pneumothorax. Upper Abdomen: 5 mm nonobstructing stone noted upper pole right kidney. 2.7 cm left adrenal lipoma or myelolipoma has decreased from 3.5 cm previously. Musculoskeletal: No worrisome lytic or sclerotic osseous abnormality. Bilateral gynecomastia. Evidence of vertebral augmentation at L1. IMPRESSION: 1. Patchy ground-glass opacities in both lungs with superimposed areas of more confluent nodular soft tissue density at the  bases. Imaging features likely related to infectious/inflammatory etiology and atypical infection would be a consideration. Follow-up CT in 3 months recommended to ensure resolution. 2. Diffuse bronchial wall thickening with cylindrical bronchiectasis bilaterally. 3. Interval decrease in size of the left adrenal lipoma or myelolipoma. 4. Nonobstructing right renal stone. 5. Aortic Atherosclerosis (ICD10-I70.0). Electronically Signed   By: Misty Stanley M.D.   On: 12/14/2020 10:52   US Abdomen Complete  Result Date: 12/14/2020 CLINICAL DATA:  Pancytopenia EXAM: ABDOMEN ULTRASOUND COMPLETE COMPARISON:  November 14, 2015. FINDINGS: Gallbladder: No gallstones or wall thickening visualized. No sonographic Murphy sign noted by sonographer. Common bile duct: Diameter: 3 mm Liver: No focal lesion identified. Mildly heterogeneous liver echotexture. Revisualization of a mildly nodular contour of the liver similar comparison to prior. Portal vein is patent on color Doppler imaging with normal direction of blood flow towards the liver. IVC: No abnormality visualized. Pancreas: Visualized portion unremarkable. Spleen: Size and appearance within normal limits. Right Kidney: Length: 9.9 cm. Echogenicity within normal limits. No mass or hydronephrosis visualized. Left Kidney: Length: 11.9 cm.  Echogenicity within normal limits. No mass or hydronephrosis visualized. Abdominal aorta: Limited assessment secondary to shadowing bowel gas of the distal aorta. Within these limitations, no aneurysm visualized proximally. Other findings: None. IMPRESSION: 1. No sonographic evidence of splenomegaly. 2. Mildly nodular contour of the liver with heterogeneous echotexture may reflect underlying cirrhosis. No focal lesion identified. Electronically Signed   By: Valentino Saxon MD   On: 12/14/2020 14:14    Labs:  CBC: Recent Labs    11/29/20 8889 12/14/20 0911 12/15/20 0617 12/15/20 1646 12/16/20 0423  WBC 2.1* 1.9* 1.6*  --  2.6*  HGB 7.9* 6.0* 7.0* 7.7* 8.0*  HCT 22.7* 17.8* 20.2* 21.9* 23.6*  PLT 96* 48* 39*  --  38*    COAGS: Recent Labs    11/29/20 0921 12/14/20 1122  INR 1.0 1.2  APTT 26 20*    BMP: Recent Labs    04/02/20 1455 11/29/20 0921 12/14/20 0911 12/15/20 0617  NA 137 141 139 136  K 5.1 4.2 4.3 4.6  CL 97 102 106 103  CO2 _0 GLUCOSE 107* 114* 125* 111*  BUN 48* 35* 40* 37*  CALCIUM 10.3* 9.3 9.0 9.0  CREATININE 1.90* 2.01* 2.19* 2.03*  GFRNONAA 32*  --  29* 32*  GFRAA 37*  --   --   --     LIVER FUNCTION TESTS: Recent Labs    04/02/20 1455 11/29/20 0921 12/14/20 0911  BILITOT 0.4 0.7 0.9  AST _1 ALT _2 ALKPHOS 63 58 45  PROT 7.2 6.8 6.7  ALBUMIN 4.9* 4.6 3.7    TUMOR MARKERS: No results for input(s): AFPTM, CEA, CA199, CHROMGRNA in the last 8760 hours.  Assessment and Plan:  85 y/o M with history of a.fib on Eliquis who presented to [facility] ED on 7/23 with worsening hemoptysis despite reduction of Eliquis. He was found to have macrocytic anemia and thrombocytopenia concerning for MDS. IR has been consulted for bone marrow aspiration/biopsy with moderate sedation.  Risks and benefits of bone marrow biopsy was discussed with the patient and/or patient's family including, but not limited to bleeding, infection, damage to  adjacent structures or low yield requiring additional tests.  All of the questions were answered and there is agreement to proceed.  Consent signed and in chart.   Thank you for this interesting consult.  I greatly enjoyed meeting Vincent Mason  and look forward to participating in their care.  A copy of this report was sent to the requesting provider on this date.  Electronically Signed: Joaquim Nam, PA-C 12/16/2020, 10:05 AM   I spent a total of 20 Minutes in face to face in clinical consultation, greater than 50% of which was counseling/coordinating care for bone marrow biopsy.

## 2020-12-16 NOTE — Progress Notes (Signed)
PROGRESS NOTE    LOYALTY FADLER  [id number] DOB: [birth year] DOA: 12/14/2020 PCP: Birdie Sons, MD   Brief Narrative:  HPI: Vincent Mason is a 85 y.o. male with h/o atrial fibrillation on  Coumadin, COPD, hypertension with stage III chronic kidney disease, dyslipidemia who presents to the ER for evaluation of hemoptysis.  Patient states that for the last 2 weeks he usually coughs up a small amount of dark blood fasting in the morning but over the last 24 hours he notes that he has had an increase in the amount of blood he coughs up.  -In the ED he was noted to have a creatinine of 2.1 and severe pancytopenia with hemoglobin of 6.0, platelets of 48 and WBC of 1.9  -CT scan of the chest noted patchy groundglass opacities in both lungs with superimposed areas of confluent nodular soft tissue density at the bases, suspected to be infectious/inflammatory in etiology, atypical infection could be consideration   Assessment & Plan:   Severe pancytopenia -Transfuse 1 unit of PRBC yesterday -Etiology is unclear, some concern for cirrhosis on abdominal ultrasound but no splenomegaly noted -High suspicion for myeloproliferative disorder, ?  MDS -Plan for bone marrow aspiration biopsy today -Oncology following -B12 noted to be slightly low, continue supplementation  Cough with hemoptysis: Groundglass opacities in both lower lobes with confluent nodular soft tissue density at the bases on CT, favored to represent infectious/inflammatory etiology -Holding apixaban, work-up for pancytopenia as noted above -Pulmonary consulted, seen by Dr. Devona Konig -Plan to hold off on invasive diagnostic work-up at this time, pending above work-up -Holding off on antibiotics at this time  Chronic systolic CHF -Last echo in 2020 notes EF of 40% -Clinically appears euvolemic -Diuretics on hold on Demadex PRN at baseline -Continue metoprolol, hold Aldactone  CKD 3B -Creatinine stable at baseline, monitor,  hold Aldactone today while n.p.o. for biopsy  CAD/CABG -Continue metoprolol and rosuvastatin  Chronic atrial fibrillation -Continue metoprolol, apixaban on hold  DVT prophylaxis: SCDs Start: 12/14/20 1303   Code Status: Full Code  Family Communication: No family at bedside, discussed patient in detail, will attempt to update spouse later  Status is: Inpatient  Remains inpatient appropriate because:Ongoing diagnostic testing needed not appropriate for outpatient work up  Dispo: The patient is from: Home              Anticipated d/c is to: Home              Patient currently is not medically stable to d/c.   Difficult to place patient No    Consultants:  Oncology and pulmonology  Procedures:  None  Antimicrobials:  Anti-infectives (From admission, onward)    None          Subjective: -Feels okay overall, continues to have intermittent hemoptysis which is unchanged from baseline  Objective: Vitals:   12/16/20 1032 12/16/20 1040 12/16/20 1045 12/16/20 1050  BP: (!) 106/48 117/81 134/82 122/61  Pulse: 88 97 (!) 103 97  Resp: '16 14 18 20  '$ Temp:      TempSrc:      SpO2: 99% 99% 100% 100%  Weight:      Height:        Intake/Output Summary (Last 24 hours) at 12/16/2020 1053 Last data filed at 12/16/2020 0539 Gross per 24 hour  Intake 408 ml  Output 500 ml  Net -92 ml   Filed Weights   12/14/20 0901  Weight: 81.6 kg    Examination:  General exam: Elderly chronically ill male laying in bed, AAOx3, no distress HEENT: No JVD CVS: S1-S2, regular rate rhythm Lungs: Few scattered rhonchi at the bases otherwise clear Abdomen: Soft, nontender, bowel sounds present, nondistended Extremities: No edema Skin: No rash on exposed skin Psychiatry:  Mood & affect appropriate.    Data Reviewed: I have personally reviewed following labs and imaging studies  CBC: Recent Labs  Lab 12/14/20 0911 12/15/20 0617 12/15/20 1646 12/16/20 0423  WBC 1.9* 1.6*  --  2.6*   HGB 6.0* 7.0* 7.7* 8.0*  HCT 17.8* 20.2* 21.9* 23.6*  MCV 110.6* 104.7*  --  101.3*  PLT 48* 39*  --  38*   Basic Metabolic Panel: Recent Labs  Lab 12/14/20 0911 12/15/20 0617  NA 139 136  K 4.3 4.6  CL 106 103  CO2 23 26  GLUCOSE 125* 111*  BUN 40* 37*  CREATININE 2.19* 2.03*  CALCIUM 9.0 9.0   GFR: Estimated Creatinine Clearance: 28.5 mL/min (A) (by C-G formula based on SCr of 2.03 mg/dL (H)). Liver Function Tests: Recent Labs  Lab 12/14/20 0911  AST 17  ALT 11  ALKPHOS 45  BILITOT 0.9  PROT 6.7  ALBUMIN 3.7   No results for input(s): LIPASE, AMYLASE in the last 168 hours. No results for input(s): AMMONIA in the last 168 hours. Coagulation Profile: Recent Labs  Lab 12/14/20 1122  INR 1.2   Cardiac Enzymes: No results for input(s): CKTOTAL, CKMB, CKMBINDEX, TROPONINI in the last 168 hours. BNP (last 3 results) No results for input(s): PROBNP in the last 8760 hours. HbA1C: No results for input(s): HGBA1C in the last 72 hours. CBG: No results for input(s): GLUCAP in the last 168 hours. Lipid Profile: No results for input(s): CHOL, HDL, LDLCALC, TRIG, CHOLHDL, LDLDIRECT in the last 72 hours. Thyroid Function Tests: No results for input(s): TSH, T4TOTAL, FREET4, T3FREE, THYROIDAB in the last 72 hours. Anemia Panel: Recent Labs    12/14/20 0911 12/14/20 1246 12/14/20 1938  VITAMINB12  --   --  165*  FOLATE  --   --  11.2  TIBC  --  207*  --   IRON  --  93  --   RETICCTPCT 1.6  --   --    Sepsis Labs: Recent Labs  Lab 12/14/20 0911 12/15/20 0617 12/16/20 0423  PROCALCITON 0.12 <0.10 <0.10    Recent Results (from the past 240 hour(s))  Resp Panel by RT-PCR (Flu A&B, Covid) Nasopharyngeal Swab     Status: None   Collection Time: 12/14/20 11:22 AM   Specimen: Nasopharyngeal Swab; Nasopharyngeal(NP) swabs in vial transport medium  Result Value Ref Range Status   SARS Coronavirus 2 by RT PCR NEGATIVE NEGATIVE Final    Comment: (NOTE) SARS-CoV-2  target nucleic acids are NOT DETECTED.  The SARS-CoV-2 RNA is generally detectable in upper respiratory specimens during the acute phase of infection. The lowest concentration of SARS-CoV-2 viral copies this assay can detect is 138 copies/mL. A negative result does not preclude SARS-Cov-2 infection and should not be used as the sole basis for treatment or other patient management decisions. A negative result may occur with  improper specimen collection/handling, submission of specimen other than nasopharyngeal swab, presence of viral mutation(s) within the areas targeted by this assay, and inadequate number of viral copies(<138 copies/mL). A negative result must be combined with clinical observations, patient history, and epidemiological information. The expected result is Negative.  Fact Sheet for Patients:  EntrepreneurPulse.com.au  Fact Sheet for Healthcare  Providers:  IncredibleEmployment.be  This test is no t yet approved or cleared by the [place] and  has been authorized for detection and/or diagnosis of SARS-CoV-2 by FDA under an Emergency Use Authorization (EUA). This EUA will remain  in effect (meaning this test can be used) for the duration of the COVID-19 declaration under Section 564(b)(1) of the Act, 21 U.S.C.section 360bbb-3(b)(1), unless the authorization is terminated  or revoked sooner.       Influenza A by PCR NEGATIVE NEGATIVE Final   Influenza B by PCR NEGATIVE NEGATIVE Final    Comment: (NOTE) The Xpert Xpress SARS-CoV-2/FLU/RSV plus assay is intended as an aid in the diagnosis of influenza from Nasopharyngeal swab specimens and should not be used as a sole basis for treatment. Nasal washings and aspirates are unacceptable for Xpert Xpress SARS-CoV-2/FLU/RSV testing.  Fact Sheet for Patients: EntrepreneurPulse.com.au  Fact Sheet for Healthcare  Providers: IncredibleEmployment.be  This test is not yet approved or cleared by the [place] FDA and has been authorized for detection and/or diagnosis of SARS-CoV-2 by FDA under an Emergency Use Authorization (EUA). This EUA will remain in effect (meaning this test can be used) for the duration of the COVID-19 declaration under Section 564(b)(1) of the Act, 21 U.S.C. section 360bbb-3(b)(1), unless the authorization is terminated or revoked.  Performed at [facility], [address] [place], [place] [address]       Radiology Studies: US Abdomen Complete  Result Date: 12/14/2020 CLINICAL DATA:  Pancytopenia EXAM: ABDOMEN ULTRASOUND COMPLETE COMPARISON:  November 14, 2015. FINDINGS: Gallbladder: No gallstones or wall thickening visualized. No sonographic Murphy sign noted by sonographer. Common bile duct: Diameter: 3 mm Liver: No focal lesion identified. Mildly heterogeneous liver echotexture. Revisualization of a mildly nodular contour of the liver similar comparison to prior. Portal vein is patent on color Doppler imaging with normal direction of blood flow towards the liver. IVC: No abnormality visualized. Pancreas: Visualized portion unremarkable. Spleen: Size and appearance within normal limits. Right Kidney: Length: 9.9 cm. Echogenicity within normal limits. No mass or hydronephrosis visualized. Left Kidney: Length: 11.9 cm. Echogenicity within normal limits. No mass or hydronephrosis visualized. Abdominal aorta: Limited assessment secondary to shadowing bowel gas of the distal aorta. Within these limitations, no aneurysm visualized proximally. Other findings: None. IMPRESSION: 1. No sonographic evidence of splenomegaly. 2. Mildly nodular contour of the liver with heterogeneous echotexture may reflect underlying cirrhosis. No focal lesion identified. Electronically Signed   By: Valentino Saxon MD   On: 12/14/2020 14:14    Scheduled Meds:  allopurinol  200  mg Oral Daily   cholecalciferol  5,000 Units Oral Daily   cyanocobalamin  1,000 mcg Intramuscular Daily   fentaNYL       ferrous sulfate  325 mg Oral Daily   gabapentin  300 mg Oral BID   heparin lock flush       metoprolol succinate  12.5 mg Oral Daily   midazolam       rosuvastatin  10 mg Oral Daily   sodium chloride flush  3 mL Intravenous Q12H   spironolactone  25 mg Oral Daily   Continuous Infusions:  sodium chloride     sodium chloride       LOS: 1 day   Time spent: 30 minutes   Domenic Polite, MD [facility]  12/16/2020, 10:53 AM

## 2020-12-16 NOTE — Telephone Encounter (Signed)
Copied from [place] [id number]. Topic: General - Inquiry >> Dec 13, 2020  3:51 PM Greggory Keen D wrote: Reason for CRM:  Pt's son Raford Brissett is faxing his father's pow over so he can get his dad's health care info.  He wants to know the test results of dad' lst test. . He is going over to his dad's and have him call us so he can talk to [place] and find out what is going on with hs dad.Marland Kitchen

## 2020-12-17 ENCOUNTER — Telehealth: Payer: Self-pay | Admitting: *Deleted

## 2020-12-17 DIAGNOSIS — D61818 Other pancytopenia: Secondary | ICD-10-CM

## 2020-12-17 DIAGNOSIS — R042 Hemoptysis: Secondary | ICD-10-CM | POA: Diagnosis not present

## 2020-12-17 LAB — BASIC METABOLIC PANEL
Anion gap: 8 (ref 5–15)
BUN: 42 mg/dL — ABNORMAL HIGH (ref 8–23)
CO2: 23 mmol/L (ref 22–32)
Calcium: 8.5 mg/dL — ABNORMAL LOW (ref 8.9–10.3)
Chloride: 103 mmol/L (ref 98–111)
Creatinine, Ser: 1.75 mg/dL — ABNORMAL HIGH (ref 0.61–1.24)
GFR, Estimated: 38 mL/min — ABNORMAL LOW (ref >60)
Glucose, Bld: 103 mg/dL — ABNORMAL HIGH (ref 70–99)
Potassium: 4.5 mmol/L (ref 3.5–5.1)
Sodium: 134 mmol/L — ABNORMAL LOW (ref 135–145)

## 2020-12-17 LAB — CBC
HCT: 22.3 % — ABNORMAL LOW (ref 39.0–52.0)
Hemoglobin: 7.7 g/dL — ABNORMAL LOW (ref 13.0–17.0)
MCH: 34.4 pg — ABNORMAL HIGH (ref 26.0–34.0)
MCHC: 34.5 g/dL (ref 30.0–36.0)
MCV: 99.6 fL (ref 80.0–100.0)
Platelets: 34 10*3/uL — ABNORMAL LOW (ref 150–400)
RBC: 2.24 MIL/uL — ABNORMAL LOW (ref 4.22–5.81)
RDW: 22 % — ABNORMAL HIGH (ref 11.5–15.5)
WBC: 2.3 10*3/uL — ABNORMAL LOW (ref 4.0–10.5)
nRBC: 1.8 % — ABNORMAL HIGH (ref 0.0–0.2)

## 2020-12-17 MED ORDER — VITAMIN B-12 1000 MCG PO TABS: 1000.0000 ug | ORAL_TABLET | Freq: Every day | ORAL | 1 refills | Status: AC

## 2020-12-17 MED ORDER — TORSEMIDE 20 MG PO TABS: 10.0000 mg | ORAL_TABLET | Freq: Every day | ORAL | Status: AC

## 2020-12-17 MED ORDER — GABAPENTIN 300 MG PO CAPS: 300.0000 mg | ORAL_CAPSULE | Freq: Every day | ORAL | Status: AC

## 2020-12-17 NOTE — Evaluation (Signed)
Physical Therapy Evaluation Patient Details Name: Vincent Mason MRN: [id number] DOB: 12-10-1935 Today's Date: 12/17/2020   History of Present Illness  85 y.o. male with h/o atrial fibrillation on  Coumadin, COPD, hypertension with stage III chronic kidney disease, dyslipidemia who presents to the ER for evaluation of hemoptysis.  Patient states that for the last 2 weeks he usually coughs up a small amount of dark blood  Clinical Impression  Pt was able to get to EOB, rise to standing and circumambulate the nurses' station w/o direct assist.  He showed good confidence and safety and though he had some fatigue he was able to ambulate ~200 ft w/ O2 remaining in the 90s.  Pt has family that assists with errands, etc and feels good about being able to manage at home. Eager to d/c today.    Follow Up Recommendations No PT follow up    Equipment Recommendations  None recommended by PT    Recommendations for Other Services       Precautions / Restrictions Precautions Precautions: Fall Restrictions Weight Bearing Restrictions: No      Mobility  Bed Mobility Overal bed mobility: Modified Independent                  Transfers Overall transfer level: Modified independent Equipment used: Rolling walker (2 wheeled)             General transfer comment: minimal cuing for appropriate set up but able to rise w/o direct assist  Ambulation/Gait Ambulation/Gait assistance: Min guard Gait Distance (Feet): 200 Feet Assistive device: Rolling walker (2 wheeled)       General Gait Details: Pt used to 4WW and therefore did not like FWW, but was ultimately able to circumambulate the nurses' station safely and with relativley stable vitals (O2 remained in low 90s, HR 90-110) with no LOBs or safety issues  Stairs            Wheelchair Mobility    Modified Rankin (Stroke Patients Only)       Balance Overall balance assessment: Modified Independent                                            Pertinent Vitals/Pain Pain Assessment: No/denies pain    Home Living Family/patient expects to be discharged to:: Private residence Living Arrangements: Spouse/significant other Available Help at Discharge: Family   Home Access: Level entry     Home Layout: One level Home Equipment: Environmental consultant - 4 wheels      Prior Function Level of Independence: Independent with assistive device(s)         Comments: report he is not out of the home much, but can do all ADLs, etc in the home w/o assist     Hand Dominance        Extremity/Trunk Assessment   Upper Extremity Assessment Upper Extremity Assessment: Overall WFL for tasks assessed    Lower Extremity Assessment Lower Extremity Assessment: Overall WFL for tasks assessed       Communication   Communication: No difficulties  Cognition Arousal/Alertness: Awake/alert Behavior During Therapy: WFL for tasks assessed/performed Overall Cognitive Status: Within Functional Limits for tasks assessed  General Comments General comments (skin integrity, edema, etc.): Pt reports he is at/near his baseline, confident about being able to manage at home    Exercises     Assessment/Plan    PT Assessment Patent does not need any further PT services  PT Problem List         PT Treatment Interventions      PT Goals (Current goals can be found in the Care Plan section)  Acute Rehab PT Goals Patient Stated Goal: go home today PT Goal Formulation: All assessment and education complete, DC therapy    Frequency     Barriers to discharge        Co-evaluation               AM-PAC PT "6 Clicks" Mobility  Outcome Measure Help needed turning from your back to your side while in a flat bed without using bedrails?: None Help needed moving from lying on your back to sitting on the side of a flat bed without using bedrails?: None Help  needed moving to and from a bed to a chair (including a wheelchair)?: None Help needed standing up from a chair using your arms (e.g., wheelchair or bedside chair)?: None Help needed to walk in hospital room?: None Help needed climbing 3-5 steps with a railing? : None 6 Click Score: 24    End of Session Equipment Utilized During Treatment: Gait belt Activity Tolerance: Patient tolerated treatment well Patient left: with bed alarm set;with call bell/phone within reach Nurse Communication: Mobility status PT Visit Diagnosis: Difficulty in walking, not elsewhere classified (R26.2)    Time: 1551-1610 PT Time Calculation (min) (ACUTE ONLY): 19 min   Charges:   PT Evaluation $PT Eval Low Complexity: 1 Low          Kreg Shropshire, DPT 12/17/2020, 5:53 PM

## 2020-12-17 NOTE — Discharge Summary (Signed)
Physician Discharge Summary  FIORE DETJEN HBZ:[id number] DOB: November 16, 1935 DOA: 12/14/2020  PCP: Birdie Sons, MD  Admit date: 12/14/2020 Discharge date: 12/17/2020  Time spent: 35 minutes  Recommendations for Outpatient Follow-up:  Oncology Dr. Rogue Bussing on Friday 7/29, review  bone marrow results PCP in 1 week, Eliquis discontinued Pulmonary Dr. Devona Konig if hemoptysis persists   Discharge Diagnoses:  Severe pancytopenia, suspect myeloproliferative disorder   Cough with hemoptysis Chronic systolic CHF   Chronic kidney disease (CKD), stage III (moderate) (HCC)   Chronic atrial fibrillation ([place])   Essential hypertension   Acute on chronic systolic CHF (congestive heart failure) ([place])   Pancytopenia (HCC)   Vitamin B12 deficiency   Discharge Condition: Stable  Diet recommendation: Low-sodium, heart healthy  Filed Weights   12/14/20 0901  Weight: 81.6 kg    History of present illness:  Vincent Mason is a 85 y.o. male with h/o atrial fibrillation on  Coumadin, COPD, hypertension with stage III chronic kidney disease, dyslipidemia who presents to the ER for evaluation of hemoptysis.  Patient states that for the last 2 weeks he usually coughs up a small amount of dark blood fasting in the morning but over the last 24 hours he notes that he has had an increase in the amount of blood he coughs up.  -In the ED he was noted to have a creatinine of 2.1 and severe pancytopenia with hemoglobin of 6.0, platelets of 48 and WBC of 1.9  -CT scan of the chest noted patchy groundglass opacities in both lungs with superimposed areas of confluent nodular soft tissue density at the bases, suspected to be infectious/inflammatory in etiology, atypical infection could be consideration   Hospital Course:   Severe pancytopenia -Transfused 1 unit of PRBC on admission -High suspicion for myeloproliferative disorder, oncology consulted, underwent bone marrow aspiration and biopsy yesterday -some  concern for cirrhosis on abdominal ultrasound but no splenomegaly noted -Clinically overall stable, hemoglobin in the 7.5-8 range now -B12 noted to be slightly low, continue supplementation -Plan for discharge home today with close follow-up with oncology on Friday 7/29 -Will need CBC check in 1 week   Mild hemoptysis: Groundglass opacities in both lower lobes with confluent nodular soft tissue density at the bases on CT, favored to represent infectious/inflammatory etiology -Discontinued apixaban due to severe thrombocytopenia, work-up for pancytopenia as noted above -Pulmonary consulted, seen by Dr. Devona Konig -Plan to hold off on invasive diagnostic work-up at this time, pending above work-up -Remains stable without antibiotics, hemoptysis improving -Follow-up with pulmonary Dr. Humphrey Rolls if hemoptysis persists or recurs   Chronic systolic CHF -Last echo in 2020 notes EF of 40% -Clinically appears euvolemic -Torsemide resumed at discharge, dose decreased to 10 mg daily -Continue metoprolol, Aldactone resumed   CKD 3B -Creatinine stable at baseline   CAD/CABG -Continue metoprolol and rosuvastatin   Chronic atrial fibrillation -Continue metoprolol, apixaban discontinued due to severe thrombocytopenia  Discharge Exam: Vitals:   12/17/20 0736 12/17/20 1116  BP: 102/63 (!) 103/58  Pulse: 67 75  Resp: 20 18  Temp: (!) 97.5 F (36.4 C) 98.4 F (36.9 C)  SpO2: 100% 97%    General: AAOx3 Cardiovascular: S1S2/RRR Respiratory: CTAB  Discharge Instructions    Allergies as of 12/17/2020   No Known Allergies      Medication List     STOP taking these medications    apixaban 5 MG Tabs tablet Commonly known as: ELIQUIS       TAKE these medications  allopurinol 100 MG tablet Commonly known as: ZYLOPRIM Take 2 tablets (200 mg total) by mouth daily.   ferrous sulfate 325 (65 FE) MG tablet Take 1 tablet (325 mg total) by mouth daily.   gabapentin 300 MG  capsule Commonly known as: NEURONTIN Take 1 capsule (300 mg total) by mouth at bedtime. What changed: when to take this   rosuvastatin 10 MG tablet Commonly known as: CRESTOR TAKE 1 TABLET BY MOUTH ONCE DAILY   spironolactone 25 MG tablet Commonly known as: ALDACTONE Take 25 mg by mouth daily.   torsemide 20 MG tablet Commonly known as: DEMADEX Take 0.5 tablets (10 mg total) by mouth daily. Take one daily as needed for excessive swelling What changed:  how much to take how to take this when to take this   vitamin B-12 1000 MCG tablet Commonly known as: CYANOCOBALAMIN Take 1 tablet (1,000 mcg total) by mouth daily.   Vitamin D-3 125 MCG (5000 UT) Tabs Take 5,000 Units by mouth daily.       ASK your doctor about these medications    metoprolol succinate 25 MG 24 hr tablet Commonly known as: TOPROL-XL TAKE 1/2 A TABLET BY MOUTH DAILY   oxyCODONE 15 MG immediate release tablet Commonly known as: ROXICODONE TAKE 1 TABLET BY MOUTH EVERY 4 HOURS AS NEEDED   polyethylene glycol 17 g packet Commonly known as: MIRALAX / GLYCOLAX Take 17 g by mouth daily.   Proctozone-HC 2.5 % rectal cream Generic drug: hydrocortisone PLACE 1 APPLICATION RECTALLY TWICE DAILYAS DIRECTED       No Known Allergies    The results of significant diagnostics from this hospitalization (including imaging, microbiology, ancillary and laboratory) are listed below for reference.    Significant Diagnostic Studies: DG Chest 2 View  Result Date: 11/30/2020 CLINICAL DATA:  Recent episodes of hemoptysis EXAM: CHEST - 2 VIEW COMPARISON:  09/29/2017 FINDINGS: Previous coronary bypass changes. Stable cardiomegaly without superimposed CHF or edema. No effusion or pneumothorax. Similar chronic emphysema pattern and basilar interstitial changes/fibrosis. Left upper lobe 10 mm nodular opacity versus nodule projects over the left third anterior rib shadow. This could represent developing nodule versus  superimposed shadows/parenchymal scarring. Trachea midline. Aorta atherosclerotic. Bones are osteopenic. No acute osseous finding. IMPRESSION: Stable cardiomegaly without CHF Similar chronic changes of COPD and bibasilar interstitial lung disease/fibrosis Left upper lobe 10 mm nodular opacity versus nodule. Consider short-term follow-up at 3 months and if persist chest CT could be performed. Aortic Atherosclerosis (ICD10-I70.0) and Emphysema (ICD10-J43.9). Electronically Signed   By: M.  Shick M.D.   On: 11/30/2020 08:43   CT Chest Wo Contrast  Result Date: 12/14/2020 CLINICAL DATA:  Hemoptysis. EXAM: CT CHEST WITHOUT CONTRAST TECHNIQUE: Multidetector CT imaging of the chest was performed following the standard protocol without IV contrast. COMPARISON:  08/30/2011 FINDINGS: Cardiovascular: Heart is enlarged. Status post CABG. Mild atherosclerotic calcification is noted in the wall of the thoracic aorta. Mediastinum/Nodes: Scattered small mediastinal lymph nodes evident. No evidence for gross hilar lymphadenopathy although assessment is limited by the lack of intravenous contrast on today's study. The esophagus has normal imaging features. There is no axillary lymphadenopathy. Lungs/Pleura: Patchy ground-glass opacities identified in both lungs. Ground-glass disease in the lung bases has superimposed areas of more confluent nodular soft tissue density (including 12 mm paraspinal right lower lobe on image 114/2 and 2.3 cm posterior left lower lobe on 01/20 8/2. Diffuse bronchial wall thickening with cylindrical bronchiectasis noted bilaterally. No pleural effusion. No pneumothorax. Upper Abdomen: 5 mm   nonobstructing stone noted upper pole right kidney. 2.7 cm left adrenal lipoma or myelolipoma has decreased from 3.5 cm previously. Musculoskeletal: No worrisome lytic or sclerotic osseous abnormality. Bilateral gynecomastia. Evidence of vertebral augmentation at L1. IMPRESSION: 1. Patchy ground-glass opacities in  both lungs with superimposed areas of more confluent nodular soft tissue density at the bases. Imaging features likely related to infectious/inflammatory etiology and atypical infection would be a consideration. Follow-up CT in 3 months recommended to ensure resolution. 2. Diffuse bronchial wall thickening with cylindrical bronchiectasis bilaterally. 3. Interval decrease in size of the left adrenal lipoma or myelolipoma. 4. Nonobstructing right renal stone. 5. Aortic Atherosclerosis (ICD10-I70.0). Electronically Signed   By: Eric  Mansell M.D.   On: 12/14/2020 10:52   US Abdomen Complete  Result Date: 12/14/2020 CLINICAL DATA:  Pancytopenia EXAM: ABDOMEN ULTRASOUND COMPLETE COMPARISON:  November 14, 2015. FINDINGS: Gallbladder: No gallstones or wall thickening visualized. No sonographic Murphy sign noted by sonographer. Common bile duct: Diameter: 3 mm Liver: No focal lesion identified. Mildly heterogeneous liver echotexture. Revisualization of a mildly nodular contour of the liver similar comparison to prior. Portal vein is patent on color Doppler imaging with normal direction of blood flow towards the liver. IVC: No abnormality visualized. Pancreas: Visualized portion unremarkable. Spleen: Size and appearance within normal limits. Right Kidney: Length: 9.9 cm. Echogenicity within normal limits. No mass or hydronephrosis visualized. Left Kidney: Length: 11.9 cm. Echogenicity within normal limits. No mass or hydronephrosis visualized. Abdominal aorta: Limited assessment secondary to shadowing bowel gas of the distal aorta. Within these limitations, no aneurysm visualized proximally. Other findings: None. IMPRESSION: 1. No sonographic evidence of splenomegaly. 2. Mildly nodular contour of the liver with heterogeneous echotexture may reflect underlying cirrhosis. No focal lesion identified. Electronically Signed   By: Stephanie  Peacock MD   On: 12/14/2020 14:14   CT BONE MARROW BIOPSY & ASPIRATION  Result Date:  12/16/2020 INDICATION: Pancytopenia. EXAM: CT GUIDED BONE MARROW ASPIRATES AND BIOPSY Physician: Adam R. Henn, MD MEDICATIONS: None. ANESTHESIA/SEDATION: Fentanyl 50 mcg IV; Versed 1.0 mg IV Moderate Sedation Time:  10 minutes The patient was continuously monitored during the procedure by the interventional radiology nurse under my direct supervision. COMPLICATIONS: None immediate. PROCEDURE: The procedure was explained to the patient. The risks and benefits of the procedure were discussed and the patient's questions were addressed. Informed consent was obtained from the patient. The patient was placed prone on CT table. Images of the pelvis were obtained. The right side of back was prepped and draped in sterile fashion. The skin and right posterior ilium were anesthetized with 1% lidocaine. 11 gauge bone needle was directed into the right ilium with CT guidance. Two aspirates and one core biopsy were obtained. Bandage placed over the puncture site. IMPRESSION: CT guided bone marrow aspiration and core biopsy. Electronically Signed   By: Adam  Henn M.D.   On: 12/16/2020 11:28    Microbiology: Recent Results (from the past 240 hour(s))  Resp Panel by RT-PCR (Flu A&B, Covid) Nasopharyngeal Swab     Status: None   Collection Time: 12/14/20 11:22 AM   Specimen: Nasopharyngeal Swab; Nasopharyngeal(NP) swabs in vial transport medium  Result Value Ref Range Status   SARS Coronavirus 2 by RT PCR NEGATIVE NEGATIVE Final    Comment: (NOTE) SARS-CoV-2 target nucleic acids are NOT DETECTED.  The SARS-CoV-2 RNA is generally detectable in upper respiratory specimens during the acute phase of infection. The lowest concentration of SARS-CoV-2 viral copies this assay can detect is 138 copies/mL. A   negative result does not preclude SARS-Cov-2 infection and should not be used as the sole basis for treatment or other patient management decisions. A negative result may occur with  improper specimen collection/handling,  submission of specimen other than nasopharyngeal swab, presence of viral mutation(s) within the areas targeted by this assay, and inadequate number of viral copies(<138 copies/mL). A negative result must be combined with clinical observations, patient history, and epidemiological information. The expected result is Negative.  Fact Sheet for Patients:  EntrepreneurPulse.com.au  Fact Sheet for Healthcare Providers:  IncredibleEmployment.be  This test is no t yet approved or cleared by the [place] FDA and  has been authorized for detection and/or diagnosis of SARS-CoV-2 by FDA under an Emergency Use Authorization (EUA). This EUA will remain  in effect (meaning this test can be used) for the duration of the COVID-19 declaration under Section 564(b)(1) of the Act, 21 U.S.C.section 360bbb-3(b)(1), unless the authorization is terminated  or revoked sooner.       Influenza A by PCR NEGATIVE NEGATIVE Final   Influenza B by PCR NEGATIVE NEGATIVE Final    Comment: (NOTE) The Xpert Xpress SARS-CoV-2/FLU/RSV plus assay is intended as an aid in the diagnosis of influenza from Nasopharyngeal swab specimens and should not be used as a sole basis for treatment. Nasal washings and aspirates are unacceptable for Xpert Xpress SARS-CoV-2/FLU/RSV testing.  Fact Sheet for Patients: EntrepreneurPulse.com.au  Fact Sheet for Healthcare Providers: IncredibleEmployment.be  This test is not yet approved or cleared by the [place] FDA and has been authorized for detection and/or diagnosis of SARS-CoV-2 by FDA under an Emergency Use Authorization (EUA). This EUA will remain in effect (meaning this test can be used) for the duration of the COVID-19 declaration under Section 564(b)(1) of the Act, 21 U.S.C. section 360bbb-3(b)(1), unless the authorization is terminated or revoked.  Performed at [facility], [address] [place], [place] [address]      Labs: Basic Metabolic Panel: Recent Labs  Lab 12/14/20 0911 12/15/20 0617 12/17/20 0415  NA 139 136 134*  K 4.3 4.6 4.5  CL 106 103 103  CO2 _0 GLUCOSE 125* 111* 103*  BUN 40* 37* 42*  CREATININE 2.19* 2.03* 1.75*  CALCIUM 9.0 9.0 8.5*   Liver Function Tests: Recent Labs  Lab 12/14/20 0911  AST 17  ALT 11  ALKPHOS 45  BILITOT 0.9  PROT 6.7  ALBUMIN 3.7   No results for input(s): LIPASE, AMYLASE in the last 168 hours. No results for input(s): AMMONIA in the last 168 hours. CBC: Recent Labs  Lab 12/14/20 0911 12/15/20 0617 12/15/20 1646 12/16/20 0423 12/17/20 0415  WBC 1.9* 1.6*  --  2.7*  2.6* 2.3*  NEUTROABS  --   --   --  0.5*  --   HGB 6.0* 7.0* 7.7* 8.0*  8.0* 7.7*  HCT 17.8* 20.2* 21.9* 23.3*  23.6* 22.3*  MCV 110.6* 104.7*  --  100.9*  101.3* 99.6  PLT 48* 39*  --  38*  38* 34*   Cardiac Enzymes: No results for input(s): CKTOTAL, CKMB, CKMBINDEX, TROPONINI in the last 168 hours. BNP: BNP (last 3 results) Recent Labs    12/14/20 1245  BNP 100.4*    ProBNP (last 3 results) No results for input(s): PROBNP in the last 8760 hours.  CBG: No results for input(s): GLUCAP in the last 168 hours.     Signed:  Domenic Polite MD.  [facility] 12/17/2020, 12:50 PM

## 2020-12-17 NOTE — Telephone Encounter (Signed)
Per Dr. Rogue Bussing. HP follow up re: pancytopeia. need appt for 7/29 at 9:15- MD; labs- cbc/bmp; LDH; hold tube; possible 1 unit of PRBC/platelets  Lab Orders -   [place]

## 2020-12-17 NOTE — TOC Progression Note (Signed)
Transition of Care [facility]) - Progression Note    Patient Details  Name: Vincent Mason MRN: [id number] Date of Birth: 04/26/1936  Transition of [facility]) CM/SW [place], RN Phone Number: 12/17/2020, 10:16 AM  Clinical Narrative:   Patient lives at home with wife, who can assist if needed.  Patient does not have any home health at present, and does not feel he will need home health.  Patient has no concerns about transporting to appointments, or obtaining medications, and reports that he takes medications as directed.  He has no concerns about returning home after discharge and denies any TOC needs at this time.  TOC contact information given to patient.         Expected Discharge Plan and Services                                                 Social Determinants of Health (SDOH) Interventions    Readmission Risk Interventions No flowsheet data found.

## 2020-12-18 LAB — SURGICAL PATHOLOGY

## 2020-12-19 LAB — MULTIPLE MYELOMA PANEL, SERUM
Albumin SerPl Elph-Mcnc: 3.5 g/dL (ref 2.9–4.4)
Albumin/Glob SerPl: 1.6 (ref 0.7–1.7)
Alpha 1: 0.3 g/dL (ref 0.0–0.4)
Alpha2 Glob SerPl Elph-Mcnc: 0.8 g/dL (ref 0.4–1.0)
B-Globulin SerPl Elph-Mcnc: 0.9 g/dL (ref 0.7–1.3)
Gamma Glob SerPl Elph-Mcnc: 0.4 g/dL (ref 0.4–1.8)
Globulin, Total: 2.3 g/dL (ref 2.2–3.9)
IgA: 287 mg/dL (ref 61–437)
IgG (Immunoglobin G), Serum: 567 mg/dL — ABNORMAL LOW (ref 603–1613)
IgM (Immunoglobulin M), Srm: 47 mg/dL (ref 15–143)
Total Protein ELP: 5.8 g/dL — ABNORMAL LOW (ref 6.0–8.5)

## 2020-12-20 ENCOUNTER — Other Ambulatory Visit: Payer: Self-pay

## 2020-12-20 ENCOUNTER — Inpatient Hospital Stay: Payer: Medicare Other | Attending: Internal Medicine | Admitting: Internal Medicine

## 2020-12-20 ENCOUNTER — Inpatient Hospital Stay: Payer: Medicare Other

## 2020-12-20 ENCOUNTER — Encounter: Payer: Self-pay | Admitting: Internal Medicine

## 2020-12-20 DIAGNOSIS — D46Z Other myelodysplastic syndromes: Secondary | ICD-10-CM

## 2020-12-20 DIAGNOSIS — D61818 Other pancytopenia: Secondary | ICD-10-CM

## 2020-12-20 DIAGNOSIS — Z79899 Other long term (current) drug therapy: Secondary | ICD-10-CM | POA: Insufficient documentation

## 2020-12-20 DIAGNOSIS — D469 Myelodysplastic syndrome, unspecified: Secondary | ICD-10-CM | POA: Insufficient documentation

## 2020-12-20 LAB — CBC WITH DIFFERENTIAL/PLATELET
Abs Immature Granulocytes: 0.1 10*3/uL — ABNORMAL HIGH (ref 0.00–0.07)
Basophils Absolute: 0 10*3/uL (ref 0.0–0.1)
Basophils Relative: 0 %
Eosinophils Absolute: 0 10*3/uL (ref 0.0–0.5)
Eosinophils Relative: 0 %
HCT: 22.9 % — ABNORMAL LOW (ref 39.0–52.0)
Hemoglobin: 7.7 g/dL — ABNORMAL LOW (ref 13.0–17.0)
Immature Granulocytes: 4 %
Lymphocytes Relative: 36 %
Lymphs Abs: 0.8 10*3/uL (ref 0.7–4.0)
MCH: 34.2 pg — ABNORMAL HIGH (ref 26.0–34.0)
MCHC: 33.6 g/dL (ref 30.0–36.0)
MCV: 101.8 fL — ABNORMAL HIGH (ref 80.0–100.0)
Monocytes Absolute: 0.8 10*3/uL (ref 0.1–1.0)
Monocytes Relative: 35 %
Neutro Abs: 0.6 10*3/uL — ABNORMAL LOW (ref 1.7–7.7)
Neutrophils Relative %: 25 %
Platelets: 33 10*3/uL — ABNORMAL LOW (ref 150–400)
RBC: 2.25 MIL/uL — ABNORMAL LOW (ref 4.22–5.81)
RDW: 21 % — ABNORMAL HIGH (ref 11.5–15.5)
WBC: 2.3 10*3/uL — ABNORMAL LOW (ref 4.0–10.5)
nRBC: 1.3 % — ABNORMAL HIGH (ref 0.0–0.2)

## 2020-12-20 LAB — BASIC METABOLIC PANEL
Anion gap: 11 (ref 5–15)
BUN: 42 mg/dL — ABNORMAL HIGH (ref 8–23)
CO2: 22 mmol/L (ref 22–32)
Calcium: 8.9 mg/dL (ref 8.9–10.3)
Chloride: 100 mmol/L (ref 98–111)
Creatinine, Ser: 2.06 mg/dL — ABNORMAL HIGH (ref 0.61–1.24)
GFR, Estimated: 31 mL/min — ABNORMAL LOW (ref >60)
Glucose, Bld: 115 mg/dL — ABNORMAL HIGH (ref 70–99)
Potassium: 4.5 mmol/L (ref 3.5–5.1)
Sodium: 133 mmol/L — ABNORMAL LOW (ref 135–145)

## 2020-12-20 LAB — LACTATE DEHYDROGENASE: LDH: 257 U/L — ABNORMAL HIGH (ref 98–192)

## 2020-12-20 LAB — SAMPLE TO BLOOD BANK

## 2020-12-20 LAB — PREPARE RBC (CROSSMATCH)

## 2020-12-20 MED ORDER — DIPHENHYDRAMINE HCL 25 MG PO CAPS
25.0000 mg | ORAL_CAPSULE | Freq: Once | ORAL | Status: AC
  Administered 2020-12-20: 25 mg via ORAL
  Filled 2020-12-20: qty 1

## 2020-12-20 MED ORDER — ACETAMINOPHEN 325 MG PO TABS
650.0000 mg | ORAL_TABLET | Freq: Once | ORAL | Status: AC
  Administered 2020-12-20: 650 mg via ORAL
  Filled 2020-12-20: qty 2

## 2020-12-20 MED ORDER — SODIUM CHLORIDE 0.9% FLUSH
10.0000 mL | INTRAVENOUS | Status: DC | PRN
  Filled 2020-12-20: qty 10

## 2020-12-20 MED ORDER — SODIUM CHLORIDE 0.9% IV SOLUTION
250.0000 mL | Freq: Once | INTRAVENOUS | Status: AC
  Administered 2020-12-20: 250 mL via INTRAVENOUS
  Filled 2020-12-20: qty 250

## 2020-12-20 NOTE — Progress Notes (Signed)
Vincent Mason NOTE  Patient Care Team: Vincent Sons, MD as PCP - General (Family Medicine) Vincent Skains, MD as Consulting Physician (Internal Medicine) Vincent Cotta, MD as Consulting Physician (Ophthalmology) Vincent Iba, MD as Consulting Physician (Internal Medicine) Vincent Santa, MD as Consulting Physician (Pain Medicine)  CHIEF COMPLAINTS/PURPOSE OF CONSULTATION:  MDS  #  Oncology History Overview Note  #High-grade MDS; July 2026 - Myelodysplastic syndrome with excess blasts [increased blasts (6%-8%;  with marked trilineage dysplasia.]; FISH panel pending/ awaiting to calculate /IPS-R- IPSS.  # NGS/MOLECULAR TESTS:    # PALLIATIVE CARE EVALUATION:  # PAIN MANAGEMENT:    DIAGNOSIS:   STAGE:         ;  GOALS:  CURRENT/MOST RECENT THERAPY :     MDS (myelodysplastic syndrome), high grade ([place])  12/20/2020 Initial Diagnosis   MDS (myelodysplastic syndrome), high grade ([facility])      HISTORY OF PRESENTING ILLNESS:  Vincent Mason 85 y.o.  male history of A. Fib/currently not on anticoagulation/CKD stage III is here for follow-up to review the results of his bone marrow biopsy.  Patient was recently admitted to hospital for generalized weakness/pancytopenia.  Patient received PRBC/platelets while in the hospital.  Given the concerns for bone marrow process-patient underwent a bone marrow biopsy.  Complains of generalized weakness.  Otherwise denies any cough or fevers or chills.  Chronic back pain joint pains.  Continues to bruise easily.  Review of Systems  Constitutional:  Positive for malaise/fatigue. Negative for chills, diaphoresis and fever.  HENT:  Negative for nosebleeds and sore throat.   Eyes:  Negative for double vision.  Respiratory:  Negative for cough, hemoptysis, sputum production, shortness of breath and wheezing.   Cardiovascular:  Negative for chest pain, palpitations, orthopnea and leg swelling.  Gastrointestinal:   Negative for abdominal pain, blood in stool, constipation, diarrhea, heartburn, melena, nausea and vomiting.  Genitourinary:  Negative for dysuria, frequency and urgency.  Musculoskeletal:  Positive for back pain, joint pain and neck pain.  Skin: Negative.  Negative for itching and rash.  Neurological:  Negative for dizziness, tingling, focal weakness, weakness and headaches.  Endo/Heme/Allergies:  Bruises/bleeds easily.  Psychiatric/Behavioral:  Negative for depression. The patient is not nervous/anxious and does not have insomnia.     MEDICAL HISTORY:  Past Medical History:  Diagnosis Date   Acute encephalopathy 09/03/2015   Atrial fibrillation and flutter [facility]) 2013   Dr Vincent Mason at [facility]  on [address].    C. difficile colitis 09/12/2015   COPD (chronic obstructive pulmonary disease) (HCC)    HCAP (healthcare-associated pneumonia) 09/12/2015   Hyperlipidemia    Hypertension    Kidney disease, chronic, stage III (moderate, EGFR 30-59 ml/min) ([address])    ARF 2013   OSA (obstructive sleep apnea)    non-compliant with CPAP.    Severe sepsis with septic shock ([place]) 09/12/2015    SURGICAL HISTORY: Past Surgical History:  Procedure Laterality Date   CATARACT EXTRACTION     CORONARY ARTERY BYPASS GRAFT  10/1997   EYE SURGERY     history of cervical discectomy  2009   C5-C6   Hyperplastic colon polyp  02/2002   Sigmoid polyps   KYPHOPLASTY N/A 02/06/2020   Procedure: T12 & L3 Kyphoplasty;  Surgeon: Vincent Knows, MD;  Location: [facility] ORS;  Service: Orthopedics;  Laterality: N/A;    SOCIAL HISTORY: Social History   Socioeconomic History   Marital status: Married    Spouse name: Not on file  Number of children: 3   Years of education: some colle   Highest education level: Some college, no degree  Occupational History   Occupation: Retired  Tobacco Use   Smoking status: Former   Smokeless tobacco: Never   Tobacco comments:    Quit in [place] Use   Vaping Use: Never used   Substance and Sexual Activity   Alcohol use: No   Drug use: No   Sexual activity: Yes  Other Topics Concern   Not on file  Social History Narrative   Lives in [place] with wife [wife care giver- Vincent Mason]; no smoking; no alcohol. 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    Social Determinants of Health   Financial Resource Strain: Low Risk    Difficulty of Paying Living Expenses: Not hard at all  Food Insecurity: No Food Insecurity   Worried About Charity fundraiser in the Last Year: Never true   [place] in the Last Year: Never true  Transportation Needs: No Transportation Needs   Lack of Transportation (Medical): No   Lack of Transportation (Non-Medical): No  Physical Activity: Inactive   Days of Exercise per Week: 0 days   Minutes of Exercise per Session: 0 min  Stress: Stress Concern Present   Feeling of Stress : Rather much  Social Connections: Socially Isolated   Frequency of Communication with Friends and Family: Twice a week   Frequency of Social Gatherings with Friends and Family: Never   Attends Religious Services: Never   Marine scientist or Organizations: No   Attends Music therapist: Never   Marital Status: Married  Human resources officer Violence: Not At Risk   Fear of Current or Ex-Partner: No   Emotionally Abused: No   Physically Abused: No   Sexually Abused: No    FAMILY HISTORY: Family History  Problem Relation Age of Onset   Hypertension Mother    Hypertension Father     ALLERGIES:  has No Known Allergies.  MEDICATIONS:  Current Outpatient Medications  Medication Sig Dispense Refill   allopurinol (ZYLOPRIM) 100 MG tablet Take 2 tablets (200 mg total) by mouth daily. 180 tablet 4   Cholecalciferol (VITAMIN D-3) 125 MCG (5000 UT) TABS Take 5,000 Units by mouth daily.     gabapentin (NEURONTIN) 300 MG capsule Take 1 capsule (300 mg total) by mouth at bedtime.     metoprolol succinate  (TOPROL-XL) 25 MG 24 hr tablet TAKE 1/2 A TABLET BY MOUTH DAILY (Patient taking differently: Take 12.5 mg by mouth daily.) 30 tablet 12   oxyCODONE (ROXICODONE) 15 MG immediate release tablet TAKE 1 TABLET BY MOUTH EVERY 4 HOURS AS NEEDED (Patient taking differently: Take 15 mg by mouth every 4 (four) hours as needed for pain.) 168 tablet 0   polyethylene glycol (MIRALAX / GLYCOLAX) packet Take 17 g by mouth daily. (Patient taking differently: Take 17 g by mouth daily as needed for mild constipation.) 14 each 0   PROCTOZONE-HC 2.5 % rectal cream PLACE 1 APPLICATION RECTALLY TWICE DAILYAS DIRECTED (Patient taking differently: Place 1 application rectally 2 (two) times daily as needed (discomfort/hemorrhoids).) 30 g 0   rosuvastatin (CRESTOR) 10 MG tablet TAKE 1 TABLET BY MOUTH ONCE DAILY 90 tablet 4   spironolactone (ALDACTONE) 25 MG tablet Take 25 mg by mouth daily.     torsemide (DEMADEX) 20 MG tablet Take 0.5 tablets (10 mg total) by mouth daily. Take one daily as needed for  excessive swelling     vitamin B-12 (CYANOCOBALAMIN) 1000 MCG tablet Take 1 tablet (1,000 mcg total) by mouth daily. 30 tablet 1   No current facility-administered medications for this visit.      Marland Kitchen  PHYSICAL EXAMINATION: ECOG PERFORMANCE STATUS: 1 - Symptomatic but completely ambulatory  Vitals:   12/20/20 0908  BP: 117/61  Pulse: 89  Resp: 17  Temp: 97.9 F (36.6 C)   There were no vitals filed for this visit.  Physical Exam Vitals and nursing note reviewed.  Constitutional:      Comments: Accompanied by friend.  Currently in a wheelchair given the generalized weakness.    HENT:     Head: Normocephalic and atraumatic.     Mouth/Throat:     Pharynx: Oropharynx is clear.  Eyes:     Extraocular Movements: Extraocular movements intact.     Pupils: Pupils are equal, round, and reactive to light.  Cardiovascular:     Rate and Rhythm: Normal rate and regular rhythm.  Pulmonary:     Comments: Decreased  breath sounds bilaterally.  Abdominal:     Palpations: Abdomen is soft.  Musculoskeletal:        General: Normal range of motion.     Cervical back: Normal range of motion.  Skin:    General: Skin is warm.     Comments: Multiple bruises noted.  Neurological:     General: No focal deficit present.     Mental Status: He is alert and oriented to person, place, and time.  Psychiatric:        Behavior: Behavior normal.        Judgment: Judgment normal.     LABORATORY DATA:  I have reviewed the data as listed Lab Results  Component Value Date   WBC 2.3 (L) 12/20/2020   HGB 7.7 (L) 12/20/2020   HCT 22.9 (L) 12/20/2020   MCV 101.8 (H) 12/20/2020   PLT 33 (L) 12/20/2020   Recent Labs    04/02/20 1455 11/29/20 0921 11/29/20 0921 12/14/20 0911 12/15/20 0617 12/17/20 0415 12/20/20 1101  NA 137 141   < > 139 136 134* 133*  K 5.1 4.2  --  4.3 4.6 4.5 4.5  CL 97 102  --  106 103 103 100  CO2 24 20  --  23 26 23 22   GLUCOSE 107* 114*   < > 125* 111* 103* 115*  BUN 48* 35*   < > 40* 37* 42* 42*  CREATININE 1.90* 2.01*  --  2.19* 2.03* 1.75* 2.06*  CALCIUM 10.3* 9.3  --  9.0 9.0 8.5* 8.9  GFRNONAA 32*  --    < > 29* 32* 38* 31*  GFRAA 37*  --   --   --   --   --   --   PROT 7.2 6.8  --  6.7  --   --   --   ALBUMIN 4.9* 4.6  --  3.7  --   --   --   AST 19 17  --  17  --   --   --   ALT 13 8  --  11  --   --   --   ALKPHOS 63 58  --  45  --   --   --   BILITOT 0.4 0.7  --  0.9  --   --   --    < > = values in this interval not displayed.    RADIOGRAPHIC STUDIES:  I have personally reviewed the radiological images as listed and agreed with the findings in the report. DG Chest 2 View  Result Date: 11/30/2020 CLINICAL DATA:  Recent episodes of hemoptysis EXAM: CHEST - 2 VIEW COMPARISON:  09/29/2017 FINDINGS: Previous coronary bypass changes. Stable cardiomegaly without superimposed CHF or edema. No effusion or pneumothorax. Similar chronic emphysema pattern and basilar interstitial  changes/fibrosis. Left upper lobe 10 mm nodular opacity versus nodule projects over the left third anterior rib shadow. This could represent developing nodule versus superimposed shadows/parenchymal scarring. Trachea midline. Aorta atherosclerotic. Bones are osteopenic. No acute osseous finding. IMPRESSION: Stable cardiomegaly without CHF Similar chronic changes of COPD and bibasilar interstitial lung disease/fibrosis Left upper lobe 10 mm nodular opacity versus nodule. Consider short-term follow-up at 3 months and if persist chest CT could be performed. Aortic Atherosclerosis (ICD10-I70.0) and Emphysema (ICD10-J43.9). Electronically Signed   By: Jerilynn Mages.  Shick M.D.   On: 11/30/2020 08:43   CT Chest Wo Contrast  Result Date: 12/14/2020 CLINICAL DATA:  Hemoptysis. EXAM: CT CHEST WITHOUT CONTRAST TECHNIQUE: Multidetector CT imaging of the chest was performed following the standard protocol without IV contrast. COMPARISON:  08/30/2011 FINDINGS: Cardiovascular: Heart is enlarged. Status post CABG. Mild atherosclerotic calcification is noted in the wall of the thoracic aorta. Mediastinum/Nodes: Scattered small mediastinal lymph nodes evident. No evidence for gross hilar lymphadenopathy although assessment is limited by the lack of intravenous contrast on today's study. The esophagus has normal imaging features. There is no axillary lymphadenopathy. Lungs/Pleura: Patchy ground-glass opacities identified in both lungs. Ground-glass disease in the lung bases has superimposed areas of more confluent nodular soft tissue density (including 12 mm paraspinal right lower lobe on image 114/2 and 2.3 cm posterior left lower lobe on 01/20 8/2. Diffuse bronchial wall thickening with cylindrical bronchiectasis noted bilaterally. No pleural effusion. No pneumothorax. Upper Abdomen: 5 mm nonobstructing stone noted upper pole right kidney. 2.7 cm left adrenal lipoma or myelolipoma has decreased from 3.5 cm previously. Musculoskeletal: No  worrisome lytic or sclerotic osseous abnormality. Bilateral gynecomastia. Evidence of vertebral augmentation at L1. IMPRESSION: 1. Patchy ground-glass opacities in both lungs with superimposed areas of more confluent nodular soft tissue density at the bases. Imaging features likely related to infectious/inflammatory etiology and atypical infection would be a consideration. Follow-up CT in 3 months recommended to ensure resolution. 2. Diffuse bronchial wall thickening with cylindrical bronchiectasis bilaterally. 3. Interval decrease in size of the left adrenal lipoma or myelolipoma. 4. Nonobstructing right renal stone. 5. Aortic Atherosclerosis (ICD10-I70.0). Electronically Signed   By: Misty Stanley M.D.   On: 12/14/2020 10:52   US Abdomen Complete  Result Date: 12/14/2020 CLINICAL DATA:  Pancytopenia EXAM: ABDOMEN ULTRASOUND COMPLETE COMPARISON:  November 14, 2015. FINDINGS: Gallbladder: No gallstones or wall thickening visualized. No sonographic Murphy sign noted by sonographer. Common bile duct: Diameter: 3 mm Liver: No focal lesion identified. Mildly heterogeneous liver echotexture. Revisualization of a mildly nodular contour of the liver similar comparison to prior. Portal vein is patent on color Doppler imaging with normal direction of blood flow towards the liver. IVC: No abnormality visualized. Pancreas: Visualized portion unremarkable. Spleen: Size and appearance within normal limits. Right Kidney: Length: 9.9 cm. Echogenicity within normal limits. No mass or hydronephrosis visualized. Left Kidney: Length: 11.9 cm. Echogenicity within normal limits. No mass or hydronephrosis visualized. Abdominal aorta: Limited assessment secondary to shadowing bowel gas of the distal aorta. Within these limitations, no aneurysm visualized proximally. Other findings: None. IMPRESSION: 1. No sonographic evidence of splenomegaly. 2. Mildly nodular  contour of the liver with heterogeneous echotexture may reflect underlying  cirrhosis. No focal lesion identified. Electronically Signed   By: Valentino Saxon MD   On: 12/14/2020 14:14   CT BONE MARROW BIOPSY & ASPIRATION  Result Date: 12/16/2020 INDICATION: Pancytopenia. EXAM: CT GUIDED BONE MARROW ASPIRATES AND BIOPSY Physician: Stephan Minister. Anselm Pancoast, MD MEDICATIONS: None. ANESTHESIA/SEDATION: Fentanyl 50 mcg IV; Versed 1.0 mg IV Moderate Sedation Time:  10 minutes The patient was continuously monitored during the procedure by the interventional radiology nurse under my direct supervision. COMPLICATIONS: None immediate. PROCEDURE: The procedure was explained to the patient. The risks and benefits of the procedure were discussed and the patient's questions were addressed. Informed consent was obtained from the patient. The patient was placed prone on CT table. Images of the pelvis were obtained. The right side of back was prepped and draped in sterile fashion. The skin and right posterior ilium were anesthetized with 1% lidocaine. 11 gauge bone needle was directed into the right ilium with CT guidance. Two aspirates and one core biopsy were obtained. Bandage placed over the puncture site. IMPRESSION: CT guided bone marrow aspiration and core biopsy. Electronically Signed   By: Markus Daft M.D.   On: 12/16/2020 11:28    ASSESSMENT & PLAN:   MDS (myelodysplastic syndrome), high grade ([place]) #High-grade MDS; July 2026 - Myelodysplastic syndrome with excess blasts [increased blasts (6%-8%;  with marked trilineage dysplasia.]; FISH panel pending/ awaiting to calculate /IPS-R- IPSS.  Will order NGS.  Discuss hematopathology.  #Had a long discussion with the patient and friend regarding the serious diagnosis of high-grade MDS and risk of progression to acute myeloid leukemia.  Given his age/frail status I do not think aggressive chemotherapy/stem cell transplants are feasible.  Await NGS/FISH.  #In general discussed incurable nature of high-grade MDS; however could be treated palliatively. #  I had a long discussion the patient/friend regarding the treatment options- of using lower intensity chemotherapy- of Vidaza day 1 through 5- 7 subcutaneous-Q every 28 days.  #  Discussed the potential side effects-nausea vomiting diarrhea/cytopenias-risk of infection; need for transfusion; bleeding.  However discussed that patient would need to come come to the cancer center quite frequently for lab work/possible blood transfusion/platelet transfusion.   #Today CBC shows hemoglobin 7.7/ongoing fatigue proceed with PRBC transfusion.  Platelets 30s monitor for now.  ANC-600.  Patient will need prophylactic antibiotics.  # joint pains/crmaps: check vit D- [hx of back pains]-recommend Tylenol.  Avoid NSAIDs-ibuprofen/Aleve or Motrin.  # 40 minutes face-to-face with the patient discussing the above plan of care; more than 50% of time spent on prognosis/ natural history; counseling and coordination.  Offered to speak to patient's family/son.  Unavailable-recommend follow-up next visit with the son.   # DISPOSITION: # 1 unit of PRBC today; No platelets # follow up in 1 week- MD; labs- cbc/bmp;HOLD tube; 1 unit of PRBC/platelts-Dr.B    All questions were answered. The patient Mason to call the clinic with any problems, questions or concerns.       Cammie Sickle, MD 12/21/2020 12:13 PM

## 2020-12-20 NOTE — Progress Notes (Signed)
Patient is having cramps in the left hand and some in the right hand. Also cramping on the left side neck.

## 2020-12-20 NOTE — Assessment & Plan Note (Addendum)
#  High-grade MDS; July 2026 - Myelodysplastic syndrome with excess blasts [increased blasts (6%-8%;  with marked trilineage dysplasia.]; FISH panel pending/ awaiting to calculate /IPS-R- IPSS.  Will order NGS.  Discuss hematopathology.  #Had a long discussion with the patient and friend regarding the serious diagnosis of high-grade MDS and risk of progression to acute myeloid leukemia.  Given his age/frail status I do not think aggressive chemotherapy/stem cell transplants are feasible.  Await NGS/FISH.  #In general discussed incurable nature of high-grade MDS; however could be treated palliatively. # I had a long discussion the patient/friend regarding the treatment options- of using lower intensity chemotherapy- of Vidaza day 1 through 5- 7 subcutaneous-Q every 28 days.  Again discussed that the response rates are approximately 50% [4 generalization]-however will await cytogenetic/FISH/NGS.   #  Discussed the potential side effects-nausea vomiting diarrhea/cytopenias-risk of infection; need for transfusion; bleeding.  However discussed that patient would need to come come to the cancer center quite frequently for lab work/possible blood transfusion/platelet transfusion.   #Today CBC shows hemoglobin 7.7/ongoing fatigue proceed with PRBC transfusion.  Platelets 30s monitor for now.  ANC-600.  Patient will need prophylactic antibiotics.  # joint pains/crmaps: check vit D- [hx of back pains]-recommend Tylenol.  Avoid NSAIDs-ibuprofen/Aleve or Motrin.  # 40 minutes face-to-face with the patient discussing the above plan of care; more than 50% of time spent on prognosis/ natural history; counseling and coordination.  Offered to speak to patient's family/son.  Unavailable-recommend follow-up next visit with the son.   # DISPOSITION: # 1 unit of PRBC today; No platelets # follow up in 1 week- MD; labs- cbc/bmp;HOLD tube; 1 unit of PRBC/platelts-Dr.B

## 2020-12-21 LAB — BPAM RBC
Blood Product Expiration Date: 202208262359
ISSUE DATE / TIME: 202207291304
Unit Type and Rh: 5100

## 2020-12-21 LAB — TYPE AND SCREEN
ABO/RH(D): B POS
Antibody Screen: NEGATIVE
Unit division: 0

## 2020-12-25 ENCOUNTER — Encounter (HOSPITAL_COMMUNITY): Payer: Self-pay

## 2020-12-25 LAB — SURGICAL PATHOLOGY

## 2020-12-26 ENCOUNTER — Other Ambulatory Visit: Payer: Self-pay

## 2020-12-26 DIAGNOSIS — D61818 Other pancytopenia: Secondary | ICD-10-CM

## 2020-12-26 DIAGNOSIS — D46Z Other myelodysplastic syndromes: Secondary | ICD-10-CM

## 2020-12-27 ENCOUNTER — Inpatient Hospital Stay: Payer: Medicare Other | Attending: Internal Medicine

## 2020-12-27 ENCOUNTER — Other Ambulatory Visit: Payer: Self-pay | Admitting: *Deleted

## 2020-12-27 ENCOUNTER — Inpatient Hospital Stay (HOSPITAL_BASED_OUTPATIENT_CLINIC_OR_DEPARTMENT_OTHER): Payer: Medicare Other | Admitting: Internal Medicine

## 2020-12-27 ENCOUNTER — Inpatient Hospital Stay: Payer: Medicare Other

## 2020-12-27 ENCOUNTER — Other Ambulatory Visit: Payer: Self-pay

## 2020-12-27 ENCOUNTER — Encounter: Payer: Self-pay | Admitting: Internal Medicine

## 2020-12-27 DIAGNOSIS — K5900 Constipation, unspecified: Secondary | ICD-10-CM | POA: Diagnosis not present

## 2020-12-27 DIAGNOSIS — N1832 Chronic kidney disease, stage 3b: Secondary | ICD-10-CM | POA: Diagnosis not present

## 2020-12-27 DIAGNOSIS — D46Z Other myelodysplastic syndromes: Secondary | ICD-10-CM

## 2020-12-27 DIAGNOSIS — R042 Hemoptysis: Secondary | ICD-10-CM | POA: Diagnosis not present

## 2020-12-27 DIAGNOSIS — K644 Residual hemorrhoidal skin tags: Secondary | ICD-10-CM | POA: Diagnosis not present

## 2020-12-27 DIAGNOSIS — A4189 Other specified sepsis: Secondary | ICD-10-CM | POA: Diagnosis not present

## 2020-12-27 DIAGNOSIS — D849 Immunodeficiency, unspecified: Secondary | ICD-10-CM | POA: Diagnosis not present

## 2020-12-27 DIAGNOSIS — G4733 Obstructive sleep apnea (adult) (pediatric): Secondary | ICD-10-CM | POA: Diagnosis not present

## 2020-12-27 DIAGNOSIS — N200 Calculus of kidney: Secondary | ICD-10-CM | POA: Diagnosis not present

## 2020-12-27 DIAGNOSIS — N179 Acute kidney failure, unspecified: Secondary | ICD-10-CM | POA: Diagnosis not present

## 2020-12-27 DIAGNOSIS — I482 Chronic atrial fibrillation, unspecified: Secondary | ICD-10-CM | POA: Diagnosis not present

## 2020-12-27 DIAGNOSIS — Z20822 Contact with and (suspected) exposure to covid-19: Secondary | ICD-10-CM | POA: Diagnosis not present

## 2020-12-27 DIAGNOSIS — J440 Chronic obstructive pulmonary disease with acute lower respiratory infection: Secondary | ICD-10-CM | POA: Diagnosis not present

## 2020-12-27 DIAGNOSIS — R9431 Abnormal electrocardiogram [ECG] [EKG]: Secondary | ICD-10-CM | POA: Diagnosis not present

## 2020-12-27 DIAGNOSIS — J9601 Acute respiratory failure with hypoxia: Secondary | ICD-10-CM | POA: Diagnosis not present

## 2020-12-27 DIAGNOSIS — I130 Hypertensive heart and chronic kidney disease with heart failure and stage 1 through stage 4 chronic kidney disease, or unspecified chronic kidney disease: Secondary | ICD-10-CM | POA: Diagnosis not present

## 2020-12-27 DIAGNOSIS — R778 Other specified abnormalities of plasma proteins: Secondary | ICD-10-CM | POA: Diagnosis not present

## 2020-12-27 DIAGNOSIS — I5022 Chronic systolic (congestive) heart failure: Secondary | ICD-10-CM | POA: Diagnosis not present

## 2020-12-27 DIAGNOSIS — G2581 Restless legs syndrome: Secondary | ICD-10-CM | POA: Diagnosis not present

## 2020-12-27 DIAGNOSIS — D61818 Other pancytopenia: Secondary | ICD-10-CM

## 2020-12-27 DIAGNOSIS — K625 Hemorrhage of anus and rectum: Secondary | ICD-10-CM | POA: Diagnosis not present

## 2020-12-27 DIAGNOSIS — I272 Pulmonary hypertension, unspecified: Secondary | ICD-10-CM | POA: Diagnosis not present

## 2020-12-27 DIAGNOSIS — E872 Acidosis: Secondary | ICD-10-CM | POA: Diagnosis not present

## 2020-12-27 DIAGNOSIS — D703 Neutropenia due to infection: Secondary | ICD-10-CM | POA: Diagnosis not present

## 2020-12-27 DIAGNOSIS — D649 Anemia, unspecified: Secondary | ICD-10-CM | POA: Diagnosis not present

## 2020-12-27 DIAGNOSIS — I700 Atherosclerosis of aorta: Secondary | ICD-10-CM | POA: Diagnosis not present

## 2020-12-27 DIAGNOSIS — D709 Neutropenia, unspecified: Secondary | ICD-10-CM | POA: Diagnosis not present

## 2020-12-27 DIAGNOSIS — R0602 Shortness of breath: Secondary | ICD-10-CM | POA: Diagnosis not present

## 2020-12-27 LAB — CBC WITH DIFFERENTIAL/PLATELET
Abs Immature Granulocytes: 0.04 10*3/uL (ref 0.00–0.07)
Basophils Absolute: 0 10*3/uL (ref 0.0–0.1)
Basophils Relative: 1 %
Eosinophils Absolute: 0 10*3/uL (ref 0.0–0.5)
Eosinophils Relative: 0 %
HCT: 21.7 % — ABNORMAL LOW (ref 39.0–52.0)
Hemoglobin: 7.1 g/dL — ABNORMAL LOW (ref 13.0–17.0)
Immature Granulocytes: 2 %
Lymphocytes Relative: 36 %
Lymphs Abs: 0.7 10*3/uL (ref 0.7–4.0)
MCH: 34 pg (ref 26.0–34.0)
MCHC: 32.7 g/dL (ref 30.0–36.0)
MCV: 103.8 fL — ABNORMAL HIGH (ref 80.0–100.0)
Monocytes Absolute: 0.8 10*3/uL (ref 0.1–1.0)
Monocytes Relative: 36 %
Neutro Abs: 0.5 10*3/uL — ABNORMAL LOW (ref 1.7–7.7)
Neutrophils Relative %: 25 %
Platelets: 18 10*3/uL — CL (ref 150–400)
RBC: 2.09 MIL/uL — ABNORMAL LOW (ref 4.22–5.81)
RDW: 20.1 % — ABNORMAL HIGH (ref 11.5–15.5)
Smear Review: NORMAL
WBC: 2 10*3/uL — ABNORMAL LOW (ref 4.0–10.5)
nRBC: 1 % — ABNORMAL HIGH (ref 0.0–0.2)

## 2020-12-27 LAB — BASIC METABOLIC PANEL
Anion gap: 10 (ref 5–15)
BUN: 34 mg/dL — ABNORMAL HIGH (ref 8–23)
CO2: 24 mmol/L (ref 22–32)
Calcium: 8.6 mg/dL — ABNORMAL LOW (ref 8.9–10.3)
Chloride: 102 mmol/L (ref 98–111)
Creatinine, Ser: 1.74 mg/dL — ABNORMAL HIGH (ref 0.61–1.24)
GFR, Estimated: 38 mL/min — ABNORMAL LOW (ref >60)
Glucose, Bld: 111 mg/dL — ABNORMAL HIGH (ref 70–99)
Potassium: 4.3 mmol/L (ref 3.5–5.1)
Sodium: 136 mmol/L (ref 135–145)

## 2020-12-27 LAB — SAMPLE TO BLOOD BANK

## 2020-12-27 LAB — PREPARE RBC (CROSSMATCH)

## 2020-12-27 MED ORDER — DIPHENHYDRAMINE HCL 25 MG PO CAPS
25.0000 mg | ORAL_CAPSULE | Freq: Once | ORAL | Status: AC
  Administered 2020-12-27: 25 mg via ORAL
  Filled 2020-12-27: qty 1

## 2020-12-27 MED ORDER — SODIUM CHLORIDE 0.9% IV SOLUTION
250.0000 mL | Freq: Once | INTRAVENOUS | Status: AC
  Administered 2020-12-27: 250 mL via INTRAVENOUS
  Filled 2020-12-27: qty 250

## 2020-12-27 MED ORDER — ACETAMINOPHEN 325 MG PO TABS
650.0000 mg | ORAL_TABLET | Freq: Once | ORAL | Status: AC
  Administered 2020-12-27: 650 mg via ORAL
  Filled 2020-12-27: qty 2

## 2020-12-27 NOTE — Patient Instructions (Signed)
Platelet Transfusion A platelet transfusion is a procedure in which you receive donated platelets through an IV. Platelets are tiny pieces of blood cells. When you get an injury, platelets clump together in the area to form a blood clot. This helps stop bleeding and is the beginning of the healing process. If you have too few platelets, your blood may have trouble clotting. This may cause you to bleedand bruise very easily. You may need a platelet transfusion if you have a condition that causes a low number of platelets (thrombocytopenia). A platelet transfusion may be used to stop or prevent excessive bleeding. Tell a health care provider about: Any reactions you have had during previous transfusions. Any allergies you have. All medicines you are taking, including vitamins, herbs, eye drops, creams, and over-the-counter medicines. Any blood disorders you have. Any surgeries you have had. Any medical conditions you have. Whether you are pregnant or may be pregnant. What are the risks? Generally, this is a safe procedure. However, problems may occur, including: Fever. Infection. Allergic reaction to the donor platelets. Your body's disease-fighting system (immune system) attacking the donor platelets (hemolytic reaction). This is rare. A rare reaction that causes lung damage (transfusion-related acute lung injury). What happens before the procedure? Medicines Ask your health care provider about: Changing or stopping your regular medicines. This is especially important if you are taking diabetes medicines or blood thinners. Taking medicines such as aspirin and ibuprofen. These medicines can thin your blood. Do not take these medicines unless your health care provider tells you to take them. Taking over-the-counter medicines, vitamins, herbs, and supplements. General instructions You will have a blood test to determine your blood type. Your blood type determines what kind of platelets you will  be given. Follow instructions from your health care provider about eating or drinking restrictions. If you have had an allergic reaction to a transfusion in the past, you may be given medicine to help prevent a reaction. Your temperature, blood pressure, pulse, and breathing will be monitored. What happens during the procedure?  An IV will be inserted into one of your veins. For your safety, two health care providers will verify your identity along with the donor platelets about to be infused. A bag of donor platelets will be connected to your IV. The platelets will flow into your bloodstream. This usually takes 30-60 minutes. Your temperature, blood pressure, pulse, and breathing will be monitored during the transfusion. This helps detect early signs of any reaction. You will also be monitored for other symptoms that may indicate a reaction, including chills, hives, or itching. If you have signs of a reaction at any time, your transfusion will be stopped, and you may be given medicine to help manage the reaction. When your transfusion is complete, your IV will be removed. Pressure may be applied to the IV site for a few minutes to stop any bleeding. The IV site will be covered with a bandage (dressing). The procedure may vary among health care providers and hospitals. What happens after the procedure? Your blood pressure, temperature, pulse, and breathing will be monitored until you leave the hospital or clinic. You may have some bruising and soreness at your IV site. Follow these instructions at home: Medicines Take over-the-counter and prescription medicines only as told by your health care provider. Talk with your health care provider before you take any medicines that contain aspirin or NSAIDs. These medicines increase your risk for dangerous bleeding. General instructions Change or remove your dressing as told  by your health care provider. Return to your normal activities as told by  your health care provider. Ask your health care provider what activities are safe for you. Do not take baths, swim, or use a hot tub until your health care provider approves. Ask your health care provider if you may take showers. Check your IV site every day for signs of infection. Check for: Redness, swelling, or pain. Fluid or blood. If fluid or blood drains from your IV site, use your hands to press down firmly on a bandage covering the area for a minute or two. Doing this should stop the bleeding. Warmth. Pus or a bad smell. Keep all follow-up visits as told by your health care provider. This is important. Contact a health care provider if you have: A headache that does not go away with medicine. Hives, rash, or itchy skin. Nausea or vomiting. Unusual tiredness or weakness. Signs of infection at your IV site. Get help right away if: You have a fever or chills. You urinate less often than usual. Your urine is darker colored than normal. You have any of the following: Trouble breathing. Pain in your back, abdomen, or chest. Cool, clammy skin. A fast heartbeat. Summary Platelets are tiny pieces of blood cells that clump together to form a blood clot when you have an injury. If you have too few platelets, your blood may have trouble clotting. A platelet transfusion is a procedure in which you receive donated platelets through an IV. A platelet transfusion may be used to stop or prevent excessive bleeding. After the procedure, check your IV site every day for signs of infection, including redness, swelling, pain, or warmth. This information is not intended to replace advice given to you by your health care provider. Make sure you discuss any questions you have with your healthcare provider. Document Revised: 06/16/2017 Document Reviewed: 06/16/2017 Elsevier Patient Education  2022 [place]. Blood Transfusion, Adult, Care After This sheet gives you information about how to care for  yourself after your procedure. Your doctor may also give you more specific instructions. If youhave problems or questions, contact your doctor. What can I expect after the procedure? After the procedure, it is common to have: Bruising and soreness at the IV site. A fever or chills on the day of the procedure. This may be your body's response to the new blood cells received. A headache. Follow these instructions at home: Insertion site care     Follow instructions from your doctor about how to take care of your insertion site. This is where an IV tube was put into your vein. Make sure you: Wash your hands with soap and water before and after you change your bandage (dressing). If you cannot use soap and water, use hand sanitizer. Change your bandage as told by your doctor. Check your insertion site every day for signs of infection. Check for: Redness, swelling, or pain. Bleeding from the site. Warmth. Pus or a bad smell. General instructions Take over-the-counter and prescription medicines only as told by your doctor. Rest as told by your doctor. Go back to your normal activities as told by your doctor. Keep all follow-up visits as told by your doctor. This is important. Contact a doctor if: You have itching or red, swollen areas of skin (hives). You feel worried or nervous (anxious). You feel weak after doing your normal activities. You have redness, swelling, warmth, or pain around the insertion site. You have blood coming from the insertion site, and  the blood does not stop with pressure. You have pus or a bad smell coming from the insertion site. Get help right away if: You have signs of a serious reaction. This may be coming from an allergy or the body's defense system (immune system). Signs include: Trouble breathing or shortness of breath. Swelling of the face or feeling warm (flushed). Fever or chills. Head, chest, or back pain. Dark pee (urine) or blood in the  pee. Widespread rash. Fast heartbeat. Feeling dizzy or light-headed. You may receive your blood transfusion in an outpatient setting. If so, youwill be told whom to contact to report any reactions. These symptoms may be an emergency. Do not wait to see if the symptoms will go away. Get medical help right away. Call your local emergency services (911 in the [place] Do not drive yourself to the hospital. Summary Bruising and soreness at the IV site are common. Check your insertion site every day for signs of infection. Rest as told by your doctor. Go back to your normal activities as told by your doctor. Get help right away if you have signs of a serious reaction. This information is not intended to replace advice given to you by your health care provider. Make sure you discuss any questions you have with your healthcare provider. Document Revised: 11/03/2018 Document Reviewed: 11/03/2018 Elsevier Patient Education  [place].

## 2020-12-27 NOTE — Progress Notes (Signed)
Patient here for follow up he reports a productive cough with bloody sputum. Occasional nausea.

## 2020-12-27 NOTE — Progress Notes (Signed)
[place] NOTE  Patient Care Team: Birdie Sons, MD as PCP - General (Family Medicine) Corey Skains, MD as Consulting Physician (Internal Medicine) Estill Cotta, MD as Consulting Physician (Ophthalmology) Murlean Iba, MD as Consulting Physician (Internal Medicine) Gillis Santa, MD as Consulting Physician (Pain Medicine)  CHIEF COMPLAINTS/PURPOSE OF CONSULTATION: MDS  #  Oncology History Overview Note  #High-grade MDS; July 2026 - Myelodysplastic syndrome with excess blasts [increased blasts (6%-8%;  with marked trilineage dysplasia.]; FISH panel- DEL 20 [GOOD]R- IPSS:5.5- HIGH RISK.     # AUG 15th, 2022- Vidaza   # NGS/MOLECULAR TESTS:    # PALLIATIVE CARE EVALUATION:  # PAIN MANAGEMENT:    DIAGNOSIS:   STAGE:         ;  GOALS:  CURRENT/MOST RECENT THERAPY :     MDS (myelodysplastic syndrome), high grade ([place])  12/20/2020 Initial Diagnosis   MDS (myelodysplastic syndrome), high grade ([place])   01/06/2021 -  Chemotherapy    Patient is on Treatment Plan: MYELODYSPLASIA  AZACITIDINE IV D1-5 Q28D          HISTORY OF PRESENTING ILLNESS:  Vincent Mason 85 y.o.  male history of A. Fib/currently not on anticoagulation/CKD stage IV; chronic back pain-with a new diagnosis of high-grade MDS is here for follow-up.  Patient continues to admit fatigue.  Denies any blood in stool or black or stool.  Admits to mild blood in sputum.  Otherwise no fevers or chills.  Review of Systems  Constitutional:  Positive for malaise/fatigue and weight loss. Negative for chills, diaphoresis and fever.  HENT:  Negative for nosebleeds and sore throat.   Eyes:  Negative for double vision.  Respiratory:  Positive for shortness of breath. Negative for cough, hemoptysis, sputum production and wheezing.   Cardiovascular:  Negative for chest pain, palpitations, orthopnea and leg swelling.  Gastrointestinal:  Negative for abdominal pain, blood in stool,  constipation, diarrhea, heartburn, melena, nausea and vomiting.  Genitourinary:  Negative for dysuria, frequency and urgency.  Musculoskeletal:  Positive for back pain, joint pain and neck pain.  Skin: Negative.  Negative for itching and rash.  Neurological:  Negative for dizziness, tingling, focal weakness, weakness and headaches.  Endo/Heme/Allergies:  Bruises/bleeds easily.  Psychiatric/Behavioral:  Negative for depression. The patient is not nervous/anxious and does not have insomnia.     MEDICAL HISTORY:  Past Medical History:  Diagnosis Date   Acute encephalopathy 09/03/2015   Atrial fibrillation and flutter [facility]) 2013   Dr Nehemiah Massed at [facility]  on [address].    C. difficile colitis 09/12/2015   COPD (chronic obstructive pulmonary disease) (HCC)    HCAP (healthcare-associated pneumonia) 09/12/2015   Hyperlipidemia    Hypertension    Kidney disease, chronic, stage III (moderate, EGFR 30-59 ml/min) ([place])    ARF 2013   OSA (obstructive sleep apnea)    non-compliant with CPAP.    Severe sepsis with septic shock ([place]) 09/12/2015    SURGICAL HISTORY: Past Surgical History:  Procedure Laterality Date   CATARACT EXTRACTION     CORONARY ARTERY BYPASS GRAFT  10/1997   EYE SURGERY     history of cervical discectomy  2009   C5-C6   Hyperplastic colon polyp  02/2002   Sigmoid polyps   KYPHOPLASTY N/A 02/06/2020   Procedure: T12 & L3 Kyphoplasty;  Surgeon: Hessie Knows, MD;  Location: [facility] ORS;  Service: Orthopedics;  Laterality: N/A;    SOCIAL HISTORY: Social History   Socioeconomic History   Marital status:  Married    Spouse name: Not on file   Number of children: 3   Years of education: some colle   Highest education level: Some college, no degree  Occupational History   Occupation: Retired  Tobacco Use   Smoking status: Former   Smokeless tobacco: Never   Tobacco comments:    Quit in [place] Use   Vaping Use: Never used  Substance and Sexual Activity   Alcohol use:  No   Drug use: No   Sexual activity: Yes  Other Topics Concern   Not on file  Social History Narrative   Lives in [place] with wife [wife care giver- [employer]]; no smoking; no alcohol. 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    Social Determinants of Health   Financial Resource Strain: Not on file  Food Insecurity: Not on file  Transportation Needs: Not on file  Physical Activity: Not on file  Stress: Not on file  Social Connections: Not on file  Intimate Partner Violence: Not on file    FAMILY HISTORY: Family History  Problem Relation Age of Onset   Hypertension Mother    Hypertension Father     ALLERGIES:  has No Known Allergies.  MEDICATIONS:  Current Outpatient Medications  Medication Sig Dispense Refill   allopurinol (ZYLOPRIM) 100 MG tablet Take 2 tablets (200 mg total) by mouth daily. 180 tablet 4   Cholecalciferol (VITAMIN D-3) 125 MCG (5000 UT) TABS Take 5,000 Units by mouth daily.     gabapentin (NEURONTIN) 300 MG capsule Take 1 capsule (300 mg total) by mouth at bedtime.     metoprolol succinate (TOPROL-XL) 25 MG 24 hr tablet TAKE 1/2 A TABLET BY MOUTH DAILY (Patient taking differently: Take 12.5 mg by mouth daily.) 30 tablet 12   oxyCODONE (ROXICODONE) 15 MG immediate release tablet TAKE 1 TABLET BY MOUTH EVERY 4 HOURS AS NEEDED (Patient taking differently: Take 15 mg by mouth every 4 (four) hours as needed for pain.) 168 tablet 0   PROCTOZONE-HC 2.5 % rectal cream PLACE 1 APPLICATION RECTALLY TWICE DAILYAS DIRECTED (Patient taking differently: Place 1 application rectally 2 (two) times daily as needed (discomfort/hemorrhoids).) 30 g 0   rosuvastatin (CRESTOR) 10 MG tablet TAKE 1 TABLET BY MOUTH ONCE DAILY 90 tablet 4   spironolactone (ALDACTONE) 25 MG tablet Take 25 mg by mouth daily.     torsemide (DEMADEX) 20 MG tablet Take 0.5 tablets (10 mg total) by mouth daily. Take one daily as needed for excessive swelling     vitamin B-12  (CYANOCOBALAMIN) 1000 MCG tablet Take 1 tablet (1,000 mcg total) by mouth daily. 30 tablet 1   polyethylene glycol (MIRALAX / GLYCOLAX) packet Take 17 g by mouth daily. (Patient not taking: Reported on 12/27/2020) 14 each 0   No current facility-administered medications for this visit.      Marland Kitchen  PHYSICAL EXAMINATION: ECOG PERFORMANCE STATUS: 1 - Symptomatic but completely ambulatory  Vitals:   12/27/20 1001  BP: 127/61  Pulse: (!) 112  Resp: 16  Temp: 98.7 F (37.1 C)   Filed Weights   12/27/20 1001  Weight: 174 lb 9.6 oz (79.2 kg)    Physical Exam Vitals and nursing note reviewed.  Constitutional:      Comments: Accompanied by son. Currently in a wheelchair given the generalized weakness.    HENT:     Head: Normocephalic and atraumatic.     Mouth/Throat:     Pharynx: Oropharynx is clear.  Eyes:  Extraocular Movements: Extraocular movements intact.     Pupils: Pupils are equal, round, and reactive to light.  Cardiovascular:     Rate and Rhythm: Normal rate and regular rhythm.  Pulmonary:     Comments: Decreased breath sounds bilaterally.  Abdominal:     Palpations: Abdomen is soft.  Musculoskeletal:        General: Normal range of motion.     Cervical back: Normal range of motion.  Skin:    General: Skin is warm.     Comments: Multiple bruises noted.  Neurological:     General: No focal deficit present.     Mental Status: He is alert and oriented to person, place, and time.  Psychiatric:        Behavior: Behavior normal.        Judgment: Judgment normal.     LABORATORY DATA:  I have reviewed the data as listed Lab Results  Component Value Date   WBC 2.0 (L) 12/27/2020   HGB 7.1 (L) 12/27/2020   HCT 21.7 (L) 12/27/2020   MCV 103.8 (H) 12/27/2020   PLT 18 (LL) 12/27/2020   Recent Labs    04/02/20 1455 11/29/20 0921 12/14/20 0911 12/15/20 0617 12/17/20 0415 12/20/20 1101 12/27/20 0922  NA 137 141 139   < > 134* 133* 136  K 5.1 4.2 4.3   < >  4.5 4.5 4.3  CL 97 102 106   < > 103 100 102  CO2 24 20 23    < > 23 22 24   GLUCOSE 107* 114* 125*   < > 103* 115* 111*  BUN 48* 35* 40*   < > 42* 42* 34*  CREATININE 1.90* 2.01* 2.19*   < > 1.75* 2.06* 1.74*  CALCIUM 10.3* 9.3 9.0   < > 8.5* 8.9 8.6*  GFRNONAA 32*  --  29*   < > 38* 31* 38*  GFRAA 37*  --   --   --   --   --   --   PROT 7.2 6.8 6.7  --   --   --   --   ALBUMIN 4.9* 4.6 3.7  --   --   --   --   AST 19 17 17   --   --   --   --   ALT 13 8 11   --   --   --   --   ALKPHOS 63 58 45  --   --   --   --   BILITOT 0.4 0.7 0.9  --   --   --   --    < > = values in this interval not displayed.    RADIOGRAPHIC STUDIES: I have personally reviewed the radiological images as listed and agreed with the findings in the report. DG Chest 2 View  Result Date: 11/30/2020 CLINICAL DATA:  Recent episodes of hemoptysis EXAM: CHEST - 2 VIEW COMPARISON:  09/29/2017 FINDINGS: Previous coronary bypass changes. Stable cardiomegaly without superimposed CHF or edema. No effusion or pneumothorax. Similar chronic emphysema pattern and basilar interstitial changes/fibrosis. Left upper lobe 10 mm nodular opacity versus nodule projects over the left third anterior rib shadow. This could represent developing nodule versus superimposed shadows/parenchymal scarring. Trachea midline. Aorta atherosclerotic. Bones are osteopenic. No acute osseous finding. IMPRESSION: Stable cardiomegaly without CHF Similar chronic changes of COPD and bibasilar interstitial lung disease/fibrosis Left upper lobe 10 mm nodular opacity versus nodule. Consider short-term follow-up at 3 months and if persist chest CT  could be performed. Aortic Atherosclerosis (ICD10-I70.0) and Emphysema (ICD10-J43.9). Electronically Signed   By: Jerilynn Mages.  Shick M.D.   On: 11/30/2020 08:43   CT Chest Wo Contrast  Result Date: 12/14/2020 CLINICAL DATA:  Hemoptysis. EXAM: CT CHEST WITHOUT CONTRAST TECHNIQUE: Multidetector CT imaging of the chest was performed  following the standard protocol without IV contrast. COMPARISON:  08/30/2011 FINDINGS: Cardiovascular: Heart is enlarged. Status post CABG. Mild atherosclerotic calcification is noted in the wall of the thoracic aorta. Mediastinum/Nodes: Scattered small mediastinal lymph nodes evident. No evidence for gross hilar lymphadenopathy although assessment is limited by the lack of intravenous contrast on today's study. The esophagus has normal imaging features. There is no axillary lymphadenopathy. Lungs/Pleura: Patchy ground-glass opacities identified in both lungs. Ground-glass disease in the lung bases has superimposed areas of more confluent nodular soft tissue density (including 12 mm paraspinal right lower lobe on image 114/2 and 2.3 cm posterior left lower lobe on 01/20 8/2. Diffuse bronchial wall thickening with cylindrical bronchiectasis noted bilaterally. No pleural effusion. No pneumothorax. Upper Abdomen: 5 mm nonobstructing stone noted upper pole right kidney. 2.7 cm left adrenal lipoma or myelolipoma has decreased from 3.5 cm previously. Musculoskeletal: No worrisome lytic or sclerotic osseous abnormality. Bilateral gynecomastia. Evidence of vertebral augmentation at L1. IMPRESSION: 1. Patchy ground-glass opacities in both lungs with superimposed areas of more confluent nodular soft tissue density at the bases. Imaging features likely related to infectious/inflammatory etiology and atypical infection would be a consideration. Follow-up CT in 3 months recommended to ensure resolution. 2. Diffuse bronchial wall thickening with cylindrical bronchiectasis bilaterally. 3. Interval decrease in size of the left adrenal lipoma or myelolipoma. 4. Nonobstructing right renal stone. 5. Aortic Atherosclerosis (ICD10-I70.0). Electronically Signed   By: Misty Stanley M.D.   On: 12/14/2020 10:52   US Abdomen Complete  Result Date: 12/14/2020 CLINICAL DATA:  Pancytopenia EXAM: ABDOMEN ULTRASOUND COMPLETE COMPARISON:  November 14, 2015. FINDINGS: Gallbladder: No gallstones or wall thickening visualized. No sonographic Murphy sign noted by sonographer. Common bile duct: Diameter: 3 mm Liver: No focal lesion identified. Mildly heterogeneous liver echotexture. Revisualization of a mildly nodular contour of the liver similar comparison to prior. Portal vein is patent on color Doppler imaging with normal direction of blood flow towards the liver. IVC: No abnormality visualized. Pancreas: Visualized portion unremarkable. Spleen: Size and appearance within normal limits. Right Kidney: Length: 9.9 cm. Echogenicity within normal limits. No mass or hydronephrosis visualized. Left Kidney: Length: 11.9 cm. Echogenicity within normal limits. No mass or hydronephrosis visualized. Abdominal aorta: Limited assessment secondary to shadowing bowel gas of the distal aorta. Within these limitations, no aneurysm visualized proximally. Other findings: None. IMPRESSION: 1. No sonographic evidence of splenomegaly. 2. Mildly nodular contour of the liver with heterogeneous echotexture may reflect underlying cirrhosis. No focal lesion identified. Electronically Signed   By: Valentino Saxon MD   On: 12/14/2020 14:14   CT BONE MARROW BIOPSY & ASPIRATION  Result Date: 12/16/2020 INDICATION: Pancytopenia. EXAM: CT GUIDED BONE MARROW ASPIRATES AND BIOPSY Physician: Stephan Minister. Anselm Pancoast, MD MEDICATIONS: None. ANESTHESIA/SEDATION: Fentanyl 50 mcg IV; Versed 1.0 mg IV Moderate Sedation Time:  10 minutes The patient was continuously monitored during the procedure by the interventional radiology nurse under my direct supervision. COMPLICATIONS: None immediate. PROCEDURE: The procedure was explained to the patient. The risks and benefits of the procedure were discussed and the patient's questions were addressed. Informed consent was obtained from the patient. The patient was placed prone on CT table. Images of the pelvis were  obtained. The right side of back was prepped and  draped in sterile fashion. The skin and right posterior ilium were anesthetized with 1% lidocaine. 11 gauge bone needle was directed into the right ilium with CT guidance. Two aspirates and one core biopsy were obtained. Bandage placed over the puncture site. IMPRESSION: CT guided bone marrow aspiration and core biopsy. Electronically Signed   By: Markus Daft M.D.   On: 12/16/2020 11:28    ASSESSMENT & PLAN:   MDS (myelodysplastic syndrome), high grade ([place]) #High-grade MDS; July 2026 - Myelodysplastic syndrome with excess blasts [increased blasts (6%-8%;  with marked trilineage dysplasia.];R- IPSS:5.5- HIGH RISK.   Will order NGS.    #Had a long discussion with the patient and son regarding the serious diagnosis of high-grade MDS and risk of progression to acute myeloid leukemia.  Given his age/frail status I do not think aggressive chemotherapy/stem cell transplants are feasible.  Patient understands treatments are palliative.  Discussed response rates 40 to 50%; survival median 1 to 2 years.   #Recommend Vidaza[ lower intensity chemotherapy]day 1 through 5- 7 subcutaneous-Q every 28 days.   Discussed the potential side effects-nausea vomiting diarrhea/cytopenias-risk of infection; need for transfusion; bleeding.  However discussed that patient would need to come come to the cancer center quite frequently for lab work/possible blood transfusion/platelet transfusions.  Patient interested in moving forward with treatments.  #Today CBC shows hemoglobin 7.3/ongoing fatigue proceed with PRBC transfusion.  Platelets 18-s monitor for now.  ANC-600-proceed with 1 L PRBC transfusion/platelet transfusion.  #CKD stage IV: Stable  #Prophylactic antibiotics: Levaquin/antifungal/antiviral; Evusheld.   #IV access: Discussed regarding port placement; given the need for multiple transfusions.  Will need platelet transfusion prior to port placement.  We will coordinate.  # DISPOSITION: # 1 unit of PRBC  today;1platelets #  IR mediport ASAP [coordinate with platlets] # chemo eductaion ASAP #  follow up on 15th AUG  MD; labs- cbc/Cmp;HOLD tube; 1 unit of PRBC/platelts- VIDAZA days 1-5-Dr-B  All questions were answered. The patient knows to call the clinic with any problems, questions or concerns.    Cammie Sickle, MD 12/28/2020 7:28 PM

## 2020-12-27 NOTE — Assessment & Plan Note (Addendum)
#  High-grade MDS; July 2026 - Myelodysplastic syndrome with excess blasts [increased blasts (6%-8%;  with marked trilineage dysplasia.];R- IPSS:5.5- HIGH RISK.   Will order NGS.    #Had a long discussion with the patient and son regarding the serious diagnosis of high-grade MDS and risk of progression to acute myeloid leukemia.  Given his age/frail status I do not think aggressive chemotherapy/stem cell transplants are feasible.  Patient understands treatments are palliative.  Discussed response rates 40 to 50%; survival median 1 to 2 years.   #Recommend Vidaza[ lower intensity chemotherapy]day 1 through 5- 7 subcutaneous-Q every 28 days.   Discussed the potential side effects-nausea vomiting diarrhea/cytopenias-risk of infection; need for transfusion; bleeding.  However discussed that patient would need to come come to the cancer center quite frequently for lab work/possible blood transfusion/platelet transfusions.  Patient interested in moving forward with treatments.  #Today CBC shows hemoglobin 7.3/ongoing fatigue proceed with PRBC transfusion.  Platelets 18-s monitor for now.  ANC-600-proceed with 1 L PRBC transfusion/platelet transfusion.  #CKD stage IV: Stable  #Prophylactic antibiotics: Levaquin/antifungal/antiviral; Evusheld.   #IV access: Discussed regarding port placement; given the need for multiple transfusions.  Will need platelet transfusion prior to port placement.  We will coordinate.  # DISPOSITION: # 1 unit of PRBC today;1platelets #  IR mediport ASAP [coordinate with platlets] # chemo eductaion ASAP #  follow up on 15th AUG  MD; labs- cbc/Cmp;HOLD tube; 1 unit of PRBC/platelts- VIDAZA days 1-5-Dr-B

## 2020-12-28 ENCOUNTER — Encounter: Payer: Self-pay | Admitting: Internal Medicine

## 2020-12-28 LAB — BPAM PLATELET PHERESIS
Blood Product Expiration Date: 202208062359
ISSUE DATE / TIME: 202208051153
Unit Type and Rh: 5100

## 2020-12-28 LAB — PREPARE PLATELET PHERESIS: Unit division: 0

## 2020-12-28 NOTE — Progress Notes (Signed)
START ON PATHWAY REGIMEN - MDS     A cycle is every 28 days:     Azacitidine   **Always confirm dose/schedule in your pharmacy ordering system**  Patient Characteristics: Higher-Risk (IPSS-R Score > 3.5), First Line, Not a Transplant Candidate WHO Disease Classification: MDS-EB1 Bone Marrow Blasts (percent): 5% - 10% Cytogenetic Category: Good Platelets (x 10^9/L): < 50 Absolute Neutrophil Count (x 10^9/L): < 0.8 Line of Therapy: First Line IPSS-R Risk Category: High IPSS-R Risk Score: 6 Check here if patient's risk score was calculated prior to the International Prognostic Scoring System-Revised (IPSS-R): false Hemoglobin (g/dl): < 8 Patient Characteristics: Not a Transplant Candidate Intent of Therapy: Non-Curative / Palliative Intent, Discussed with Patient

## 2020-12-29 ENCOUNTER — Encounter: Payer: Self-pay | Admitting: Internal Medicine

## 2020-12-29 LAB — BPAM RBC
Blood Product Expiration Date: 202208192359
ISSUE DATE / TIME: 202208051250
Unit Type and Rh: 1700

## 2020-12-29 LAB — TYPE AND SCREEN
ABO/RH(D): B POS
Antibody Screen: NEGATIVE
Unit division: 0

## 2020-12-30 ENCOUNTER — Telehealth: Payer: Self-pay | Admitting: *Deleted

## 2020-12-30 ENCOUNTER — Telehealth: Payer: Self-pay | Admitting: Internal Medicine

## 2020-12-30 ENCOUNTER — Other Ambulatory Visit (HOSPITAL_COMMUNITY): Payer: Self-pay

## 2020-12-30 ENCOUNTER — Encounter: Payer: Self-pay | Admitting: Internal Medicine

## 2020-12-30 ENCOUNTER — Inpatient Hospital Stay: Payer: Medicare Other | Admitting: Hospice and Palliative Medicine

## 2020-12-30 ENCOUNTER — Emergency Department: Payer: Medicare Other

## 2020-12-30 ENCOUNTER — Other Ambulatory Visit: Payer: Self-pay

## 2020-12-30 ENCOUNTER — Inpatient Hospital Stay: Payer: Medicare Other

## 2020-12-30 ENCOUNTER — Telehealth: Payer: Self-pay | Admitting: Pharmacy Technician

## 2020-12-30 ENCOUNTER — Inpatient Hospital Stay
Admission: EM | Admit: 2020-12-30 | Discharge: 2021-01-05 | DRG: 871 | Disposition: A | Discharge status: Home / Self Care | Payer: Medicare Other | Attending: Internal Medicine | Admitting: Internal Medicine

## 2020-12-30 DIAGNOSIS — E785 Hyperlipidemia, unspecified: Secondary | ICD-10-CM | POA: Diagnosis present

## 2020-12-30 DIAGNOSIS — Z515 Encounter for palliative care: Secondary | ICD-10-CM | POA: Diagnosis not present

## 2020-12-30 DIAGNOSIS — G2581 Restless legs syndrome: Secondary | ICD-10-CM | POA: Diagnosis present

## 2020-12-30 DIAGNOSIS — Z743 Need for continuous supervision: Secondary | ICD-10-CM | POA: Diagnosis not present

## 2020-12-30 DIAGNOSIS — R778 Other specified abnormalities of plasma proteins: Secondary | ICD-10-CM | POA: Diagnosis present

## 2020-12-30 DIAGNOSIS — I4891 Unspecified atrial fibrillation: Secondary | ICD-10-CM | POA: Diagnosis not present

## 2020-12-30 DIAGNOSIS — R0602 Shortness of breath: Secondary | ICD-10-CM | POA: Diagnosis not present

## 2020-12-30 DIAGNOSIS — G4733 Obstructive sleep apnea (adult) (pediatric): Secondary | ICD-10-CM | POA: Diagnosis present

## 2020-12-30 DIAGNOSIS — I272 Pulmonary hypertension, unspecified: Secondary | ICD-10-CM | POA: Diagnosis present

## 2020-12-30 DIAGNOSIS — N183 Chronic kidney disease, stage 3 unspecified: Secondary | ICD-10-CM | POA: Diagnosis not present

## 2020-12-30 DIAGNOSIS — R042 Hemoptysis: Secondary | ICD-10-CM | POA: Diagnosis present

## 2020-12-30 DIAGNOSIS — J189 Pneumonia, unspecified organism: Secondary | ICD-10-CM | POA: Diagnosis present

## 2020-12-30 DIAGNOSIS — K644 Residual hemorrhoidal skin tags: Secondary | ICD-10-CM | POA: Diagnosis not present

## 2020-12-30 DIAGNOSIS — D469 Myelodysplastic syndrome, unspecified: Secondary | ICD-10-CM | POA: Diagnosis not present

## 2020-12-30 DIAGNOSIS — A419 Sepsis, unspecified organism: Secondary | ICD-10-CM | POA: Diagnosis not present

## 2020-12-30 DIAGNOSIS — I2510 Atherosclerotic heart disease of native coronary artery without angina pectoris: Secondary | ICD-10-CM | POA: Diagnosis present

## 2020-12-30 DIAGNOSIS — Z8249 Family history of ischemic heart disease and other diseases of the circulatory system: Secondary | ICD-10-CM

## 2020-12-30 DIAGNOSIS — I499 Cardiac arrhythmia, unspecified: Secondary | ICD-10-CM | POA: Diagnosis not present

## 2020-12-30 DIAGNOSIS — J9601 Acute respiratory failure with hypoxia: Secondary | ICD-10-CM | POA: Diagnosis not present

## 2020-12-30 DIAGNOSIS — D709 Neutropenia, unspecified: Secondary | ICD-10-CM

## 2020-12-30 DIAGNOSIS — R17 Unspecified jaundice: Secondary | ICD-10-CM | POA: Diagnosis not present

## 2020-12-30 DIAGNOSIS — R0600 Dyspnea, unspecified: Secondary | ICD-10-CM

## 2020-12-30 DIAGNOSIS — D61818 Other pancytopenia: Secondary | ICD-10-CM | POA: Diagnosis present

## 2020-12-30 DIAGNOSIS — G40909 Epilepsy, unspecified, not intractable, without status epilepticus: Secondary | ICD-10-CM | POA: Diagnosis present

## 2020-12-30 DIAGNOSIS — N1832 Chronic kidney disease, stage 3b: Secondary | ICD-10-CM | POA: Diagnosis present

## 2020-12-30 DIAGNOSIS — J90 Pleural effusion, not elsewhere classified: Secondary | ICD-10-CM | POA: Diagnosis not present

## 2020-12-30 DIAGNOSIS — D849 Immunodeficiency, unspecified: Secondary | ICD-10-CM | POA: Diagnosis present

## 2020-12-30 DIAGNOSIS — N179 Acute kidney failure, unspecified: Secondary | ICD-10-CM | POA: Diagnosis not present

## 2020-12-30 DIAGNOSIS — R0689 Other abnormalities of breathing: Secondary | ICD-10-CM

## 2020-12-30 DIAGNOSIS — I482 Chronic atrial fibrillation, unspecified: Secondary | ICD-10-CM | POA: Diagnosis present

## 2020-12-30 DIAGNOSIS — R918 Other nonspecific abnormal finding of lung field: Secondary | ICD-10-CM | POA: Diagnosis not present

## 2020-12-30 DIAGNOSIS — D649 Anemia, unspecified: Secondary | ICD-10-CM | POA: Diagnosis not present

## 2020-12-30 DIAGNOSIS — Z79899 Other long term (current) drug therapy: Secondary | ICD-10-CM

## 2020-12-30 DIAGNOSIS — I5031 Acute diastolic (congestive) heart failure: Secondary | ICD-10-CM | POA: Diagnosis not present

## 2020-12-30 DIAGNOSIS — I700 Atherosclerosis of aorta: Secondary | ICD-10-CM | POA: Diagnosis not present

## 2020-12-30 DIAGNOSIS — R9431 Abnormal electrocardiogram [ECG] [EKG]: Secondary | ICD-10-CM | POA: Diagnosis not present

## 2020-12-30 DIAGNOSIS — D703 Neutropenia due to infection: Secondary | ICD-10-CM | POA: Diagnosis present

## 2020-12-30 DIAGNOSIS — K625 Hemorrhage of anus and rectum: Secondary | ICD-10-CM | POA: Diagnosis not present

## 2020-12-30 DIAGNOSIS — E872 Acidosis: Secondary | ICD-10-CM | POA: Diagnosis present

## 2020-12-30 DIAGNOSIS — Z87891 Personal history of nicotine dependence: Secondary | ICD-10-CM

## 2020-12-30 DIAGNOSIS — D46Z Other myelodysplastic syndromes: Secondary | ICD-10-CM | POA: Diagnosis present

## 2020-12-30 DIAGNOSIS — Z20822 Contact with and (suspected) exposure to covid-19: Secondary | ICD-10-CM | POA: Diagnosis present

## 2020-12-30 DIAGNOSIS — I130 Hypertensive heart and chronic kidney disease with heart failure and stage 1 through stage 4 chronic kidney disease, or unspecified chronic kidney disease: Secondary | ICD-10-CM | POA: Diagnosis present

## 2020-12-30 DIAGNOSIS — G894 Chronic pain syndrome: Secondary | ICD-10-CM | POA: Diagnosis present

## 2020-12-30 DIAGNOSIS — K5900 Constipation, unspecified: Secondary | ICD-10-CM | POA: Diagnosis not present

## 2020-12-30 DIAGNOSIS — N200 Calculus of kidney: Secondary | ICD-10-CM | POA: Diagnosis not present

## 2020-12-30 DIAGNOSIS — A4189 Other specified sepsis: Principal | ICD-10-CM | POA: Diagnosis present

## 2020-12-30 DIAGNOSIS — E538 Deficiency of other specified B group vitamins: Secondary | ICD-10-CM | POA: Diagnosis present

## 2020-12-30 DIAGNOSIS — I5022 Chronic systolic (congestive) heart failure: Secondary | ICD-10-CM | POA: Diagnosis present

## 2020-12-30 DIAGNOSIS — D4620 Refractory anemia with excess of blasts, unspecified: Secondary | ICD-10-CM | POA: Diagnosis not present

## 2020-12-30 DIAGNOSIS — I517 Cardiomegaly: Secondary | ICD-10-CM | POA: Diagnosis not present

## 2020-12-30 DIAGNOSIS — Z951 Presence of aortocoronary bypass graft: Secondary | ICD-10-CM

## 2020-12-30 DIAGNOSIS — J440 Chronic obstructive pulmonary disease with acute lower respiratory infection: Secondary | ICD-10-CM | POA: Diagnosis present

## 2020-12-30 DIAGNOSIS — J9690 Respiratory failure, unspecified, unspecified whether with hypoxia or hypercapnia: Secondary | ICD-10-CM | POA: Diagnosis not present

## 2020-12-30 DIAGNOSIS — R6889 Other general symptoms and signs: Secondary | ICD-10-CM | POA: Diagnosis not present

## 2020-12-30 DIAGNOSIS — J9600 Acute respiratory failure, unspecified whether with hypoxia or hypercapnia: Secondary | ICD-10-CM | POA: Diagnosis not present

## 2020-12-30 LAB — CBC WITH DIFFERENTIAL/PLATELET
Abs Immature Granulocytes: 0.02 10*3/uL (ref 0.00–0.07)
Basophils Absolute: 0 10*3/uL (ref 0.0–0.1)
Basophils Relative: 0 %
Eosinophils Absolute: 0 10*3/uL (ref 0.0–0.5)
Eosinophils Relative: 0 %
HCT: 21.9 % — ABNORMAL LOW (ref 39.0–52.0)
Hemoglobin: 7.2 g/dL — ABNORMAL LOW (ref 13.0–17.0)
Immature Granulocytes: 1 %
Lymphocytes Relative: 35 %
Lymphs Abs: 0.6 10*3/uL — ABNORMAL LOW (ref 0.7–4.0)
MCH: 32.7 pg (ref 26.0–34.0)
MCHC: 32.9 g/dL (ref 30.0–36.0)
MCV: 99.5 fL (ref 80.0–100.0)
Monocytes Absolute: 0.7 10*3/uL (ref 0.1–1.0)
Monocytes Relative: 40 %
Neutro Abs: 0.4 10*3/uL — CL (ref 1.7–7.7)
Neutrophils Relative %: 24 %
Platelets: 16 10*3/uL — CL (ref 150–400)
RBC: 2.2 MIL/uL — ABNORMAL LOW (ref 4.22–5.81)
RDW: 21.8 % — ABNORMAL HIGH (ref 11.5–15.5)
WBC: 1.7 10*3/uL — ABNORMAL LOW (ref 4.0–10.5)
nRBC: 1.8 % — ABNORMAL HIGH (ref 0.0–0.2)

## 2020-12-30 LAB — COMPREHENSIVE METABOLIC PANEL
ALT: 14 U/L (ref 0–44)
AST: 23 U/L (ref 15–41)
Albumin: 3.4 g/dL — ABNORMAL LOW (ref 3.5–5.0)
Alkaline Phosphatase: 46 U/L (ref 38–126)
Anion gap: 6 (ref 5–15)
BUN: 46 mg/dL — ABNORMAL HIGH (ref 8–23)
CO2: 23 mmol/L (ref 22–32)
Calcium: 8.6 mg/dL — ABNORMAL LOW (ref 8.9–10.3)
Chloride: 108 mmol/L (ref 98–111)
Creatinine, Ser: 1.94 mg/dL — ABNORMAL HIGH (ref 0.61–1.24)
GFR, Estimated: 34 mL/min — ABNORMAL LOW (ref >60)
Glucose, Bld: 100 mg/dL — ABNORMAL HIGH (ref 70–99)
Potassium: 4.9 mmol/L (ref 3.5–5.1)
Sodium: 137 mmol/L (ref 135–145)
Total Bilirubin: 1.4 mg/dL — ABNORMAL HIGH (ref 0.3–1.2)
Total Protein: 6.6 g/dL (ref 6.5–8.1)

## 2020-12-30 LAB — BLOOD GAS, ARTERIAL
Acid-base deficit: 4.8 mmol/L — ABNORMAL HIGH (ref 0.0–2.0)
Bicarbonate: 19 mmol/L — ABNORMAL LOW (ref 20.0–28.0)
FIO2: 0.4
O2 Saturation: 99.1 %
Patient temperature: 37
pCO2 arterial: 30 mmHg — ABNORMAL LOW (ref 32.0–48.0)
pH, Arterial: 7.41 (ref 7.350–7.450)
pO2, Arterial: 138 mmHg — ABNORMAL HIGH (ref 83.0–108.0)

## 2020-12-30 LAB — RESP PANEL BY RT-PCR (FLU A&B, COVID) ARPGX2
Influenza A by PCR: NEGATIVE
Influenza B by PCR: NEGATIVE
SARS Coronavirus 2 by RT PCR: NEGATIVE

## 2020-12-30 LAB — BLOOD GAS, VENOUS
Acid-base deficit: 8.2 mmol/L — ABNORMAL HIGH (ref 0.0–2.0)
Bicarbonate: 17.1 mmol/L — ABNORMAL LOW (ref 20.0–28.0)
O2 Saturation: 33.4 %
Patient temperature: 37
pCO2, Ven: 34 mmHg — ABNORMAL LOW (ref 44.0–60.0)
pH, Ven: 7.31 (ref 7.250–7.430)
pO2, Ven: <31 mmHg — CL (ref 32.0–45.0)

## 2020-12-30 LAB — BRAIN NATRIURETIC PEPTIDE: B Natriuretic Peptide: 120.7 pg/mL — ABNORMAL HIGH (ref 0.0–100.0)

## 2020-12-30 LAB — STREP PNEUMONIAE URINARY ANTIGEN: Strep Pneumo Urinary Antigen: NEGATIVE

## 2020-12-30 LAB — HEMOGLOBIN AND HEMATOCRIT, BLOOD
HCT: 24.2 % — ABNORMAL LOW (ref 39.0–52.0)
Hemoglobin: 8 g/dL — ABNORMAL LOW (ref 13.0–17.0)

## 2020-12-30 LAB — TROPONIN I (HIGH SENSITIVITY)
Troponin I (High Sensitivity): 36 ng/L — ABNORMAL HIGH (ref <18)
Troponin I (High Sensitivity): 30 ng/L — ABNORMAL HIGH (ref <18)

## 2020-12-30 MED ORDER — VANCOMYCIN HCL 2000 MG/400ML IV SOLN
2000.0000 mg | Freq: Once | INTRAVENOUS | Status: AC
  Administered 2020-12-30: 2000 mg via INTRAVENOUS
  Filled 2020-12-30: qty 400

## 2020-12-30 MED ORDER — SODIUM CHLORIDE 0.9 % IV SOLN: 500.0000 mg | Freq: Once | INTRAVENOUS | Status: DC

## 2020-12-30 MED ORDER — FUROSEMIDE 10 MG/ML IJ SOLN
20.0000 mg | Freq: Once | INTRAMUSCULAR | Status: AC
  Administered 2020-12-30: 20 mg via INTRAVENOUS
  Filled 2020-12-30: qty 4

## 2020-12-30 MED ORDER — MAGNESIUM HYDROXIDE 400 MG/5ML PO SUSP
5.0000 mL | Freq: Once | ORAL | Status: AC
  Administered 2020-12-30: 5 mL via ORAL
  Filled 2020-12-30: qty 30

## 2020-12-30 MED ORDER — SODIUM CHLORIDE 0.9 % IV SOLN: 2.0000 g | Freq: Three times a day (TID) | INTRAVENOUS | Status: DC

## 2020-12-30 MED ORDER — IPRATROPIUM-ALBUTEROL 0.5-2.5 (3) MG/3ML IN SOLN
3.0000 mL | RESPIRATORY_TRACT | Status: DC | PRN
  Filled 2020-12-30: qty 3

## 2020-12-30 MED ORDER — IPRATROPIUM-ALBUTEROL 0.5-2.5 (3) MG/3ML IN SOLN
3.0000 mL | Freq: Four times a day (QID) | RESPIRATORY_TRACT | Status: DC
  Administered 2020-12-30 – 2021-01-02 (×8): 3 mL via RESPIRATORY_TRACT
  Filled 2020-12-30 (×10): qty 3

## 2020-12-30 MED ORDER — ROSUVASTATIN CALCIUM 10 MG PO TABS
10.0000 mg | ORAL_TABLET | Freq: Every day | ORAL | Status: DC
  Administered 2020-12-31 – 2021-01-05 (×6): 10 mg via ORAL
  Filled 2020-12-30 (×6): qty 1

## 2020-12-30 MED ORDER — ALLOPURINOL 100 MG PO TABS: 200.0000 mg | ORAL_TABLET | Freq: Every day | ORAL | Status: DC

## 2020-12-30 MED ORDER — FUROSEMIDE 10 MG/ML IJ SOLN: 20.0000 mg | INTRAMUSCULAR | Status: AC

## 2020-12-30 MED ORDER — METHYLPREDNISOLONE SODIUM SUCC 125 MG IJ SOLR
125.0000 mg | Freq: Once | INTRAMUSCULAR | Status: AC
  Administered 2020-12-30: 125 mg via INTRAVENOUS
  Filled 2020-12-30: qty 2

## 2020-12-30 MED ORDER — VITAMIN D3 25 MCG (1000 UNIT) PO TABS
5000.0000 [IU] | ORAL_TABLET | Freq: Every day | ORAL | Status: DC
  Administered 2020-12-31 – 2021-01-05 (×7): 5000 [IU] via ORAL
  Filled 2020-12-30 (×12): qty 5

## 2020-12-30 MED ORDER — HYDROCORTISONE (PERIANAL) 2.5 % EX CREA
1.0000 "application " | TOPICAL_CREAM | Freq: Two times a day (BID) | CUTANEOUS | Status: DC | PRN
  Filled 2020-12-30: qty 28.35

## 2020-12-30 MED ORDER — SODIUM CHLORIDE 0.9 % IV SOLN: 2.0000 g | Freq: Two times a day (BID) | INTRAVENOUS | Status: DC

## 2020-12-30 MED ORDER — VANCOMYCIN HCL 1750 MG/350ML IV SOLN: 1750.0000 mg | INTRAVENOUS | Status: DC

## 2020-12-30 MED ORDER — ROSUVASTATIN CALCIUM 10 MG PO TABS: 10.0000 mg | ORAL_TABLET | Freq: Every day | ORAL | Status: DC

## 2020-12-30 MED ORDER — IOHEXOL 350 MG/ML SOLN
60.0000 mL | Freq: Once | INTRAVENOUS | Status: AC | PRN
  Administered 2020-12-30: 60 mL via INTRAVENOUS

## 2020-12-30 MED ORDER — SODIUM CHLORIDE 0.9 % IV SOLN
2.0000 g | Freq: Two times a day (BID) | INTRAVENOUS | Status: DC
  Administered 2020-12-30 – 2021-01-03 (×8): 2 g via INTRAVENOUS
  Filled 2020-12-30 (×9): qty 2

## 2020-12-30 MED ORDER — VITAMIN B-12 1000 MCG PO TABS
1000.0000 ug | ORAL_TABLET | Freq: Every day | ORAL | Status: DC
  Administered 2020-12-31 – 2021-01-05 (×6): 1000 ug via ORAL
  Filled 2020-12-30 (×7): qty 1

## 2020-12-30 MED ORDER — VITAMIN D-3 125 MCG (5000 UT) PO TABS: 5000.0000 [IU] | ORAL_TABLET | Freq: Every day | ORAL | Status: DC

## 2020-12-30 MED ORDER — SODIUM CHLORIDE 0.9% IV SOLUTION
Freq: Once | INTRAVENOUS | Status: AC
  Filled 2020-12-30: qty 250

## 2020-12-30 MED ORDER — ALLOPURINOL 100 MG PO TABS
200.0000 mg | ORAL_TABLET | Freq: Every day | ORAL | Status: DC
  Administered 2020-12-31 – 2021-01-05 (×6): 200 mg via ORAL
  Filled 2020-12-30 (×7): qty 2

## 2020-12-30 MED ORDER — VITAMIN B-12 1000 MCG PO TABS: 1000.0000 ug | ORAL_TABLET | Freq: Every day | ORAL | Status: DC

## 2020-12-30 MED ORDER — MELATONIN 5 MG PO TABS
2.5000 mg | ORAL_TABLET | Freq: Every evening | ORAL | Status: DC | PRN
  Administered 2020-12-30 – 2021-01-04 (×4): 2.5 mg via ORAL
  Filled 2020-12-30 (×5): qty 1

## 2020-12-30 MED ORDER — POLYETHYLENE GLYCOL 3350 17 G PO PACK
17.0000 g | PACK | Freq: Every day | ORAL | Status: DC | PRN
  Administered 2020-12-30: 17 g via ORAL
  Filled 2020-12-30: qty 1

## 2020-12-30 MED ORDER — POLYETHYLENE GLYCOL 3350 17 G PO PACK
17.0000 g | PACK | Freq: Every day | ORAL | Status: DC
  Administered 2020-12-31 – 2021-01-05 (×6): 17 g via ORAL
  Filled 2020-12-30 (×7): qty 1

## 2020-12-30 MED ORDER — MAGNESIUM HYDROXIDE 400 MG/5ML PO SUSP: 15.0000 mL | Freq: Once | ORAL | Status: DC

## 2020-12-30 MED ORDER — METOPROLOL TARTRATE 5 MG/5ML IV SOLN: 2.5000 mg | Freq: Four times a day (QID) | INTRAVENOUS | Status: DC | PRN

## 2020-12-30 MED ORDER — SENNOSIDES-DOCUSATE SODIUM 8.6-50 MG PO TABS
1.0000 | ORAL_TABLET | Freq: Every evening | ORAL | Status: DC | PRN
  Administered 2021-01-02 – 2021-01-04 (×2): 1 via ORAL
  Filled 2020-12-30 (×2): qty 1

## 2020-12-30 MED ORDER — SODIUM CHLORIDE 0.9 % IV SOLN: 1.0000 g | Freq: Once | INTRAVENOUS | Status: DC

## 2020-12-30 MED ORDER — AZITHROMYCIN 500 MG IV SOLR
500.0000 mg | INTRAVENOUS | Status: AC
  Administered 2020-12-30 – 2021-01-02 (×4): 500 mg via INTRAVENOUS
  Filled 2020-12-30 (×5): qty 500

## 2020-12-30 MED ORDER — ONDANSETRON HCL 4 MG/2ML IJ SOLN
4.0000 mg | Freq: Four times a day (QID) | INTRAMUSCULAR | Status: DC | PRN
  Administered 2021-01-01 – 2021-01-03 (×2): 4 mg via INTRAVENOUS
  Filled 2020-12-30 (×2): qty 2

## 2020-12-30 MED ORDER — SODIUM CHLORIDE 0.9 % IV SOLN
10.0000 mL/h | Freq: Once | INTRAVENOUS | Status: AC
  Administered 2020-12-30: 10 mL/h via INTRAVENOUS

## 2020-12-30 MED ORDER — ACETAMINOPHEN 325 MG PO TABS
650.0000 mg | ORAL_TABLET | Freq: Four times a day (QID) | ORAL | Status: DC | PRN
  Administered 2021-01-01: 650 mg via ORAL
  Filled 2020-12-30: qty 2

## 2020-12-30 NOTE — Consult Note (Signed)
[place]  [phone] [phone]   Name: XZAVIOR REINIG Date: 12/30/2020 MRN: [id number]  DOB: 1936/03/18  Patient Care Team: Birdie Sons, MD as PCP - General (Family Medicine) Corey Skains, MD as Consulting Physician (Internal Medicine) Dingeldein, Remo Lipps, MD as Consulting Physician (Ophthalmology) Murlean Iba, MD as Consulting Physician (Internal Medicine) Gillis Santa, MD as Consulting Physician (Pain Medicine)    REASON FOR CONSULTATION: ULYSES PANICO is a 85 y.o. male with multiple medical problems including CAD status post CABG, chronic A. fib not on anticoagulation, CKD stage IIIb, and newly diagnosed high-grade transfusion dependent MDS with plan to start on treatment with azacitidine.  Patient was recently hospitalized 12/14/2020 to 12/17/2020 for hemoptysis.  Patient was readmitted to the hospital 12/30/2020 with multifocal pneumonia and found to have severe pancytopenia.  Palliative care was consulted help address goals.  SOCIAL HISTORY:     reports that he has quit smoking. He has never used smokeless tobacco. He reports that he does not drink alcohol and does not use drugs.  Patient is married and lives at home with his wife and a son.  His wife has dementia.  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  ADVANCE DIRECTIVES:  Reportedly patient has advance directives but these are not currently on file.  CODE STATUS: Full code  PAST MEDICAL HISTORY: Past Medical History:  Diagnosis Date   Acute encephalopathy 09/03/2015   Atrial fibrillation and flutter ([place]) 2013   Dr Nehemiah Massed at [facility]  on [address].    C. difficile colitis 09/12/2015   COPD (chronic obstructive pulmonary disease) (HCC)    HCAP (healthcare-associated pneumonia) 09/12/2015   Hyperlipidemia    Hypertension    Kidney disease, chronic, stage III (moderate, EGFR 30-59 ml/min) ([place])    ARF 2013    OSA (obstructive sleep apnea)    non-compliant with CPAP.    Severe sepsis with septic shock ([place]) 09/12/2015    PAST SURGICAL HISTORY:  Past Surgical History:  Procedure Laterality Date   CATARACT EXTRACTION     CORONARY ARTERY BYPASS GRAFT  10/1997   EYE SURGERY     history of cervical discectomy  2009   C5-C6   Hyperplastic colon polyp  02/2002   Sigmoid polyps   KYPHOPLASTY N/A 02/06/2020   Procedure: T12 & L3 Kyphoplasty;  Surgeon: Hessie Knows, MD;  Location: [facility] ORS;  Service: Orthopedics;  Laterality: N/A;    HEMATOLOGY/ONCOLOGY HISTORY:  Oncology History Overview Note  #High-grade MDS; July 2026 - Myelodysplastic syndrome with excess blasts [increased blasts (6%-8%;  with marked trilineage dysplasia.]; FISH panel- DEL 20 [GOOD]R- IPSS:5.5- HIGH RISK.     # AUG 15th, 2022- [facility]   # NGS/MOLECULAR TESTS:    # PALLIATIVE CARE EVALUATION:  # PAIN MANAGEMENT:    DIAGNOSIS:   STAGE:         ;  GOALS:  CURRENT/MOST RECENT THERAPY :     MDS (myelodysplastic syndrome), high grade ([place])  12/20/2020 Initial Diagnosis   MDS (myelodysplastic syndrome), high grade ([place])   01/06/2021 -  Chemotherapy    Patient is on Treatment Plan: MYELODYSPLASIA  AZACITIDINE IV D1-5 Q28D         ALLERGIES:  has No Known Allergies.  MEDICATIONS:  Current Facility-Administered Medications  Medication Dose Route Frequency Provider Last Rate Last Admin   acetaminophen (TYLENOL) tablet 650 mg  650 mg Oral Q6H PRN Kayleen Memos, DO       [  START ON 12/31/2020] allopurinol (ZYLOPRIM) tablet 200 mg  200 mg Oral Daily Hall, Carole N, DO       azithromycin (ZITHROMAX) 500 mg in sodium chloride 0.9 % 250 mL IVPB  500 mg Intravenous Q24H Kayleen Memos, DO 250 mL/hr at 12/30/20 1819 500 mg at 12/30/20 1819   ceFEPIme (MAXIPIME) 2 g in sodium chloride 0.9 % 100 mL IVPB  2 g Intravenous Q12H Hall, Carole N, DO       furosemide (LASIX) injection 20 mg  20 mg Intravenous STAT Hall, Carole N,  DO       hydrocortisone (ANUSOL-HC) 2.5 % rectal cream 1 application  1 application Rectal BID PRN Irene Pap N, DO       ipratropium-albuterol (DUONEB) 0.5-2.5 (3) MG/3ML nebulizer solution 3 mL  3 mL Nebulization Q6H Hall, Carole N, DO   3 mL at 12/30/20 6720   melatonin tablet 2.5 mg  2.5 mg Oral QHS PRN Irene Pap N, DO       metoprolol tartrate (LOPRESSOR) injection 2.5 mg  2.5 mg Intravenous Q6H PRN Irene Pap N, DO       ondansetron (ZOFRAN) injection 4 mg  4 mg Intravenous Q6H PRN Hall, Carole N, DO       polyethylene glycol (MIRALAX / GLYCOLAX) packet 17 g  17 g Oral Daily PRN Irene Pap N, DO   17 g at 12/30/20 1816   [START ON 12/31/2020] rosuvastatin (CRESTOR) tablet 10 mg  10 mg Oral Daily Hall, Carole N, DO       [START ON 01/01/2021] vancomycin (VANCOREADY) IVPB 1750 mg/350 mL  1,750 mg Intravenous Q48H Rauer, Samantha O, RPH       vancomycin (VANCOREADY) IVPB 2000 mg/400 mL  2,000 mg Intravenous Once Rauer, Forde Dandy, RPH       [START ON 12/31/2020] vitamin B-12 (CYANOCOBALAMIN) tablet 1,000 mcg  1,000 mcg Oral Daily Irene Pap N, DO       [START ON 12/31/2020] Vitamin D-3 TABS 5,000 Units  5,000 Units Oral Daily Kayleen Memos, DO       Current Outpatient Medications  Medication Sig Dispense Refill   allopurinol (ZYLOPRIM) 100 MG tablet Take 2 tablets (200 mg total) by mouth daily. 180 tablet 4   Cholecalciferol (VITAMIN D-3) 125 MCG (5000 UT) TABS Take 5,000 Units by mouth daily.     gabapentin (NEURONTIN) 300 MG capsule Take 1 capsule (300 mg total) by mouth at bedtime.     metoprolol succinate (TOPROL-XL) 25 MG 24 hr tablet TAKE 1/2 A TABLET BY MOUTH DAILY (Patient taking differently: Take 12.5 mg by mouth daily.) 30 tablet 12   oxyCODONE (ROXICODONE) 15 MG immediate release tablet TAKE 1 TABLET BY MOUTH EVERY 4 HOURS AS NEEDED (Patient taking differently: Take 15 mg by mouth every 4 (four) hours as needed for pain.) 168 tablet 0   polyethylene glycol (MIRALAX / GLYCOLAX)  packet Take 17 g by mouth daily. (Patient not taking: Reported on 12/27/2020) 14 each 0   PROCTOZONE-HC 2.5 % rectal cream PLACE 1 APPLICATION RECTALLY TWICE DAILYAS DIRECTED (Patient taking differently: Place 1 application rectally 2 (two) times daily as needed (discomfort/hemorrhoids).) 30 g 0   rosuvastatin (CRESTOR) 10 MG tablet TAKE 1 TABLET BY MOUTH ONCE DAILY 90 tablet 4   spironolactone (ALDACTONE) 25 MG tablet Take 25 mg by mouth daily.     torsemide (DEMADEX) 20 MG tablet Take 0.5 tablets (10 mg total) by mouth daily. Take one daily as needed for  excessive swelling     vitamin B-12 (CYANOCOBALAMIN) 1000 MCG tablet Take 1 tablet (1,000 mcg total) by mouth daily. 30 tablet 1    VITAL SIGNS: BP 125/72   Pulse (!) 107   Temp 98.1 F (36.7 C) (Oral)   Resp (!) 21   Ht _0  (1.778 m)   Wt 195 lb (88.5 kg)   SpO2 93%   BMI 27.98 kg/m  Filed Weights   12/30/20 1149  Weight: 195 lb (88.5 kg)    Estimated body mass index is 27.98 kg/m as calculated from the following:   Height as of this encounter: _1  (1.778 m).   Weight as of this encounter: 195 lb (88.5 kg).  LABS: CBC:    Component Value Date/Time   WBC 1.7 (L) 12/30/2020 1200   HGB 7.2 (L) 12/30/2020 1200   HGB 7.9 (L) 11/29/2020 0921   HCT 21.9 (L) 12/30/2020 1200   HCT 22.7 (L) 11/29/2020 0921   PLT 16 (LL) 12/30/2020 1200   PLT 96 (LL) 11/29/2020 0921   MCV 99.5 12/30/2020 1200   MCV 104 (H) 11/29/2020 0921   MCV 92 09/01/2014 1845   NEUTROABS 0.4 (LL) 12/30/2020 1200   NEUTROABS 3.4 10/10/2011 0934   LYMPHSABS 0.6 (L) 12/30/2020 1200   LYMPHSABS 1.6 10/10/2011 0934   MONOABS 0.7 12/30/2020 1200   MONOABS 0.6 10/10/2011 0934   EOSABS 0.0 12/30/2020 1200   EOSABS 0.2 10/10/2011 0934   BASOSABS 0.0 12/30/2020 1200   BASOSABS 0.0 10/10/2011 0934   Comprehensive Metabolic Panel:    Component Value Date/Time   NA 137 12/30/2020 1200   NA 141 11/29/2020 0921   NA 136 06/30/2014 0657   K 4.9 12/30/2020  1200   K 3.6 06/30/2014 0657   CL 108 12/30/2020 1200   CL 102 06/30/2014 0657   CO2 23 12/30/2020 1200   CO2 27 06/30/2014 0657   BUN 46 (H) 12/30/2020 1200   BUN 35 (H) 11/29/2020 0921   BUN 41 (H) 06/30/2014 0657   CREATININE 1.94 (H) 12/30/2020 1200   CREATININE 2.42 (H) 04/26/2017 1123   GLUCOSE 100 (H) 12/30/2020 1200   GLUCOSE 131 (H) 06/30/2014 0657   CALCIUM 8.6 (L) 12/30/2020 1200   CALCIUM 8.6 06/30/2014 0657   AST 23 12/30/2020 1200   AST 28 06/30/2014 0657   ALT 14 12/30/2020 1200   ALT 26 06/30/2014 0657   ALKPHOS 46 12/30/2020 1200   ALKPHOS 61 06/30/2014 0657   BILITOT 1.4 (H) 12/30/2020 1200   BILITOT 0.7 11/29/2020 0921   BILITOT 0.6 06/30/2014 0657   PROT 6.6 12/30/2020 1200   PROT 6.8 11/29/2020 0921   PROT 7.7 06/30/2014 0657   ALBUMIN 3.4 (L) 12/30/2020 1200   ALBUMIN 4.6 11/29/2020 0921   ALBUMIN 3.7 06/30/2014 0657    RADIOGRAPHIC STUDIES: DG Chest 1 View  Result Date: 12/30/2020 CLINICAL DATA:  Shortness of breath, worsening. EXAM: CHEST  1 VIEW COMPARISON:  11/29/2020 and 12/14/2020 FINDINGS: Substantially progressive bilateral interstitial lung patchy airspace opacities distributed throughout both lungs. No compelling prominence of Kerley B lines. Mild enlargement of the cardiopericardial silhouette is present. No blunting of the costophrenic angles. Prior CABG. Atherosclerotic calcification of the aortic arch. IMPRESSION: 1. Substantially worsened bilateral interstitial and patchy bilateral airspace opacities, with possibilities including progressive multilobar atypical pneumonia or cardiogenic edema. Cardiogenic edema seems less likely given the paucity of Kerley B lines. However, there is mild enlargement of the cardiopericardial silhouette and the patient has had  prior CABG. 2.  Aortic Atherosclerosis (ICD10-I70.0). Electronically Signed   By: Van Clines M.D.   On: 12/30/2020 12:34   CT Chest Wo Contrast  Result Date: 12/14/2020 CLINICAL  DATA:  Hemoptysis. EXAM: CT CHEST WITHOUT CONTRAST TECHNIQUE: Multidetector CT imaging of the chest was performed following the standard protocol without IV contrast. COMPARISON:  08/30/2011 FINDINGS: Cardiovascular: Heart is enlarged. Status post CABG. Mild atherosclerotic calcification is noted in the wall of the thoracic aorta. Mediastinum/Nodes: Scattered small mediastinal lymph nodes evident. No evidence for gross hilar lymphadenopathy although assessment is limited by the lack of intravenous contrast on today's study. The esophagus has normal imaging features. There is no axillary lymphadenopathy. Lungs/Pleura: Patchy ground-glass opacities identified in both lungs. Ground-glass disease in the lung bases has superimposed areas of more confluent nodular soft tissue density (including 12 mm paraspinal right lower lobe on image 114/2 and 2.3 cm posterior left lower lobe on 01/20 8/2. Diffuse bronchial wall thickening with cylindrical bronchiectasis noted bilaterally. No pleural effusion. No pneumothorax. Upper Abdomen: 5 mm nonobstructing stone noted upper pole right kidney. 2.7 cm left adrenal lipoma or myelolipoma has decreased from 3.5 cm previously. Musculoskeletal: No worrisome lytic or sclerotic osseous abnormality. Bilateral gynecomastia. Evidence of vertebral augmentation at L1. IMPRESSION: 1. Patchy ground-glass opacities in both lungs with superimposed areas of more confluent nodular soft tissue density at the bases. Imaging features likely related to infectious/inflammatory etiology and atypical infection would be a consideration. Follow-up CT in 3 months recommended to ensure resolution. 2. Diffuse bronchial wall thickening with cylindrical bronchiectasis bilaterally. 3. Interval decrease in size of the left adrenal lipoma or myelolipoma. 4. Nonobstructing right renal stone. 5. Aortic Atherosclerosis (ICD10-I70.0). Electronically Signed   By: Misty Stanley M.D.   On: 12/14/2020 10:52   CT Angio  Chest PE W and/or Wo Contrast  Result Date: 12/30/2020 CLINICAL DATA:  Pulmonary emboli suspected. High probability. Worsening shortness of breath. EXAM: CT ANGIOGRAPHY CHEST WITH CONTRAST TECHNIQUE: Multidetector CT imaging of the chest was performed using the standard protocol during bolus administration of intravenous contrast. Multiplanar CT image reconstructions and MIPs were obtained to evaluate the vascular anatomy. CONTRAST:  91m OMNIPAQUE IOHEXOL 350 MG/ML SOLN COMPARISON:  Chest radiography same day.  CT chest 12/14/2020. FINDINGS: Cardiovascular: Heart is enlarged. There is four-chamber enlargement, but particular enlargement of the right atrium. There is no pericardial fluid. There is been previous median sternotomy and CABG. Pulmonary arterial opacification is good. There are no pulmonary emboli. Mediastinum/Nodes: No mediastinal or hilar mass or lymphadenopathy. Lungs/Pleura: Considerable worsening of bilateral pulmonary infiltrates with a geographic pattern. Findings suggest viral pneumonia, possibly coronavirus. Small amount of pleural fluid on the left. No dense lobar consolidation or collapse. Upper Abdomen: No acute finding. Nonobstructing renal stone on the right. Chronic 2.8 cm fatty mass of the left adrenal gland. Musculoskeletal: Chronic thoracic degenerative changes. Old T12 compression fracture treated with vertebral augmentation. Review of the MIP images confirms the above findings. IMPRESSION: No pulmonary emboli. Marked worsening of diffuse geographic pulmonary infiltrates consistent with worsening atypical pneumonia, possibly coronavirus. Four-chamber cardiac enlargement, with particular enlargement of the right atrium. Aortic Atherosclerosis (ICD10-I70.0). Electronically Signed   By: MNelson ChimesM.D.   On: 12/30/2020 15:19   UKoreaAbdomen Complete  Result Date: 12/14/2020 CLINICAL DATA:  Pancytopenia EXAM: ABDOMEN ULTRASOUND COMPLETE COMPARISON:  November 14, 2015. FINDINGS:  Gallbladder: No gallstones or wall thickening visualized. No sonographic Murphy sign noted by sonographer. Common bile duct: Diameter: 3 mm Liver: No focal lesion  identified. Mildly heterogeneous liver echotexture. Revisualization of a mildly nodular contour of the liver similar comparison to prior. Portal vein is patent on color Doppler imaging with normal direction of blood flow towards the liver. IVC: No abnormality visualized. Pancreas: Visualized portion unremarkable. Spleen: Size and appearance within normal limits. Right Kidney: Length: 9.9 cm. Echogenicity within normal limits. No mass or hydronephrosis visualized. Left Kidney: Length: 11.9 cm. Echogenicity within normal limits. No mass or hydronephrosis visualized. Abdominal aorta: Limited assessment secondary to shadowing bowel gas of the distal aorta. Within these limitations, no aneurysm visualized proximally. Other findings: None. IMPRESSION: 1. No sonographic evidence of splenomegaly. 2. Mildly nodular contour of the liver with heterogeneous echotexture may reflect underlying cirrhosis. No focal lesion identified. Electronically Signed   By: Valentino Saxon MD   On: 12/14/2020 14:14   CT BONE MARROW BIOPSY & ASPIRATION  Result Date: 12/16/2020 INDICATION: Pancytopenia. EXAM: CT GUIDED BONE MARROW ASPIRATES AND BIOPSY Physician: Stephan Minister. Anselm Pancoast, MD MEDICATIONS: None. ANESTHESIA/SEDATION: Fentanyl 50 mcg IV; Versed 1.0 mg IV Moderate Sedation Time:  10 minutes The patient was continuously monitored during the procedure by the interventional radiology nurse under my direct supervision. COMPLICATIONS: None immediate. PROCEDURE: The procedure was explained to the patient. The risks and benefits of the procedure were discussed and the patient's questions were addressed. Informed consent was obtained from the patient. The patient was placed prone on CT table. Images of the pelvis were obtained. The right side of back was prepped and draped in sterile  fashion. The skin and right posterior ilium were anesthetized with 1% lidocaine. 11 gauge bone needle was directed into the right ilium with CT guidance. Two aspirates and one core biopsy were obtained. Bandage placed over the puncture site. IMPRESSION: CT guided bone marrow aspiration and core biopsy. Electronically Signed   By: Markus Daft M.D.   On: 12/16/2020 11:28    PERFORMANCE STATUS (ECOG) : 2 - Symptomatic, <50% confined to bed  Review of Systems Unless otherwise noted, a complete review of systems is negative.  Physical Exam General: NAD Cardiovascular: regular rate and rhythm Pulmonary: clear ant fields Abdomen: soft, nontender, + bowel sounds GU: no suprapubic tenderness Extremities: no edema, no joint deformities Skin: no rashes Neurological: Weakness but otherwise nonfocal  IMPRESSION: Patient seen in the ER.  He is currently hypoxic on O2 and is receiving a blood transfusion.  However, patient reports improved shortness of breath.  His only current complaint is that he is hungry and constipated.  Patient recalls meeting with Dr. Rogue Bussing last week.  However, he says that he did not fully understand all that was discussed on that day.  He does recognize that he has a "cancer" that is incurable but potentially treatable.  Patient tells me that treatment may be associated with side effects but that he would prefer "discomfort over dying."  Patient says that his priority is to receive any treatment available so that he may prolong his life.  His says his primary goal is to live and continue making memories with his grandchildren.  Patient feels that previous treatment conversations have been "too negative" and he says he wants to remain optimistic.  We discussed CODE STATUS.  Patient says that he is not prepared to make any decisions at the present time but that he would like to remain a full code for now.  We did discuss the probable futility associated with resuscitative  efforts.  Symptomatically, patient endorses constipation.  He says it has been a  few days since he had a bowel movement.  He reports that he previously has managed his constipation with an over-the-counter liquid but cannot recall the name.  PLAN: -Continue current scope of treatment -Full code -Liberalize bowel regimen -We will follow  Case and plan discussed with Dr. Rogue Bussing    Time Total: 60 minutes  Visit consisted of counseling and education dealing with the complex and emotionally intense issues of symptom management and palliative care in the setting of serious and potentially life-threatening illness.Greater than 50%  of this time was spent counseling and coordinating care related to the above assessment and plan.  Signed by: Altha Harm, PhD, NP-C

## 2020-12-30 NOTE — ED Notes (Signed)
Per Cari NP, may hold off on abx until 1st unit of blood is transfused d/t risk of reaction to med and/or blood and not know cause of reaction.

## 2020-12-30 NOTE — ED Notes (Signed)
Phlebotomy @ the bedside. 

## 2020-12-30 NOTE — Telephone Encounter (Signed)
Oral Oncology Patient Advocate Encounter   Received notification from [employer] that prior authorization for Posaconazole is required.   PA submitted on CoverMyMeds Key [id number]  Status is pending   Oral Oncology Clinic will continue to follow.  [place] Patient [place] Phone [phone] Fax [phone] 12/30/2020 3:36 PM

## 2020-12-30 NOTE — ED Triage Notes (Addendum)
Pt to ER via [facility] from home with complaints of shortness of breath. Reports SHOB started a couple of days ago and gradually worsened. Labored breathing on EMS arrival. [facility] worse with exertion.   EMS VS- RA sats 66%. Placed on NRB at 28. 12 Lead unremarkable. Hx of afib. RR 23, BP 118/74.

## 2020-12-30 NOTE — Telephone Encounter (Signed)
Per family, patient being transported to the ER by EMS at this time. Will wait on assessment before sending orders for port placement. Dr. B aware that patient is being transported by EMS.

## 2020-12-30 NOTE — ED Notes (Signed)
Pt still refusing to come off bedpan. Hospitalist made aware.

## 2020-12-30 NOTE — ED Notes (Signed)
RT at bedside obtaining STAT ABG and placing pt on Bipap. MD to see pt.

## 2020-12-30 NOTE — Telephone Encounter (Signed)
On 08/08-spoke to patient's son regarding patient's overall guarded prognosis-given high-grade MDS neutropenia/multifocal pneumonia.    Discussed that chemotherapy is currently on hold/ not started given patient's acute infectious issues.  Decision for chemotherapy will be based upon patient's recovery/or thereof lack of recovery. Discussed/recommended DNR/DNI.    Son concerned about patient's quality of life moving forward.  I think is reasonable to give patient time on antibiotics-can reevaluate in the next few days regarding Plan of care.   Await evaluation with Josh/palliative care.  Discussed with [employer].  GB  FYI-Dr.Fisher.

## 2020-12-30 NOTE — ED Notes (Signed)
VBG sent on ice to lab. RT aware of sample.

## 2020-12-30 NOTE — Progress Notes (Signed)
Pharmacist Chemotherapy Monitoring - Initial Assessment    Anticipated start date: 01/06/21   The following has been reviewed per standard work regarding the patient's treatment regimen: The patient's diagnosis, treatment plan and drug doses, and organ/hematologic function Lab orders and baseline tests specific to treatment regimen  The treatment plan start date, drug sequencing, and pre-medications Prior authorization status  Patient's documented medication list, including drug-drug interaction screen and prescriptions for anti-emetics and supportive care specific to the treatment regimen The drug concentrations, fluid compatibility, administration routes, and timing of the medications to be used The patient's access for treatment and lifetime cumulative dose history, if applicable  The patient's medication allergies and previous infusion related reactions, if applicable   Changes made to treatment plan:  N/A - confirmed admin route of IV   Follow up needed:  [place], [place], 12/30/2020  1:11 PM 01/06/21

## 2020-12-30 NOTE — ED Notes (Signed)
Portable Xray at bedside.

## 2020-12-30 NOTE — ED Notes (Signed)
ASAP tray ordered from dietary.

## 2020-12-30 NOTE — ED Notes (Signed)
Pt still refusing to come off bedpan - repositioned to promote comfort.

## 2020-12-30 NOTE — Progress Notes (Signed)
Pharmacy Antibiotic Note  Vincent Mason is a 85 y.o. male admitted on 12/30/2020. Pt has medical history for recent diagnosis of myelodysplastic syndrome, pancytopenia and past medical history of COPD, OSA, atrial fibrillation, acute encephalopathy, hypertension, CKD, CAD presented to the ED for treatment and evaluation of shortness of breath. Pharmacy has been consulted for vancomycin dosing for Severe neutropenia in multifocal pna.  Plan: Start vancomycin 2g IV x 1 loading dose followed by 1750 mg q48 hours Est AUC 471 Scr used: 1.94 Monitor renal function and adjust dose as clinically indicated Obtain vancomycin level around 4th or 5th dose if continued   Height: '5\' 10"'$  (177.8 cm) Weight: 88.5 kg (195 lb) IBW/kg (Calculated) : 73  Temp (24hrs), Avg:97.9 F (36.6 C), Min:97.5 F (36.4 C), Max:98.2 F (36.8 C)  Recent Labs  Lab 12/27/20 0922 12/30/20 1200  WBC 2.0* 1.7*  CREATININE 1.74* 1.94*    Estimated Creatinine Clearance: 32.3 mL/min (A) (by C-G formula based on SCr of 1.94 mg/dL (H)).    No Known Allergies  Antimicrobials this admission: 8/8 Azithromycin >> 8/12 8/8 Cefepime >>  8/8 Vancomycin >>   Dose adjustments this admission:   Microbiology results: 8/8 BCx: sent 8/8 Sputum: sent 8/8 MRSA PCR: sent  Thank you for allowing pharmacy to be a part of this patient's care.  Forde Dandy [name] 12/30/2020 4:12 PM

## 2020-12-30 NOTE — Telephone Encounter (Signed)
-----   Message from Cammie Sickle, MD sent at 12/28/2020  8:15 PM EDT ----- Regarding: will need Foundation one hem Please arrange this with next blood draw. Thx GB

## 2020-12-30 NOTE — Telephone Encounter (Signed)
Msg received from Family. Benjie Karvonen, RN will call pt's son to get virtual visit with patient/pt's son with Merrily Pew, NP.  Patient has persistent shortness of breath. Unable to come today's apt. Family asking for home oxygen tanks asap. Family/pt does not have a home pulse ox to check oxygen levels. Reasonable to check oxygen sats as soon as possible. Per Dr. Rogue Bussing- shortness of breath most likely not r/t to hypoxia. Most likely related to low hgb.

## 2020-12-30 NOTE — ED Provider Notes (Signed)
[facility] Emergency Department Provider Note ____________________________________________   Event Date/Time   First MD Initiated Contact with Patient 12/30/20 1155     (approximate)  I have reviewed the triage vital signs and the nursing notes.   HISTORY  Chief Complaint Shortness of Breath  HPI Vincent Mason is a 85 y.o. male with recent diagnosis of myelodysplastic syndrome, pancytopenia and past medical history of COPD, OSA, atrial fibrillation, acute encephalopathy, hypertension, CKD, CAD presents to the emergency department for treatment and evaluation of shortness of breath.  He was supposed to have a telehealth visit with oncology today for evaluation to obtain home oxygen.  Shortness of breath became worse and he was sent to the emergency department via EMS.         Past Medical History:  Diagnosis Date   Acute encephalopathy 09/03/2015   Atrial fibrillation and flutter [facility]) 2013   Dr Nehemiah Massed at [facility]  on [address].    C. difficile colitis 09/12/2015   COPD (chronic obstructive pulmonary disease) (HCC)    HCAP (healthcare-associated pneumonia) 09/12/2015   Hyperlipidemia    Hypertension    Kidney disease, chronic, stage III (moderate, EGFR 30-59 ml/min) ([place])    ARF 2013   OSA (obstructive sleep apnea)    non-compliant with CPAP.    Severe sepsis with septic shock ([place]) 09/12/2015    Patient Active Problem List   Diagnosis Date Noted   MDS (myelodysplastic syndrome), high grade ([place]) 12/20/2020   Vitamin B12 deficiency 12/15/2020   Cough with hemoptysis 12/14/2020   Acute on chronic systolic CHF (congestive heart failure) ([place]) 12/14/2020   Pancytopenia ([place]) 12/14/2020   Lumbar spondylosis 01/02/2020   Compression fracture of third lumbar vertebra ([place]) 01/02/2020   Discogenic thoracic pain 01/02/2020   Chronic pain syndrome 01/02/2020   Restless leg syndrome 06/22/2018   Erectile dysfunction due to arterial insufficiency 05/04/2018    Biliary cirrhosis ([place]) 02/21/2018   Seizure ([place]) 08/06/2017   Vitamin D deficiency 04/08/2017   Neuropathy 04/08/2017   Altered mental status 01/21/2017   Blood in stool    Anemia due to chronic blood loss    Hypomagnesemia 09/14/2015   Chronic atrial fibrillation ([place]) 09/12/2015   Essential hypertension 09/12/2015   Fecal occult blood test positive 09/12/2015   Nodular hyperplasia of liver    Normocytic anemia 09/03/2015   Opioid type dependence, continuous ([place]) 09/03/2015   Hypokalemia 08/26/2015   Hyperuricemia 07/09/2015   Coronary artery disease 01/22/2015   Chronic kidney disease (CKD), stage III (moderate) (HCC) 01/22/2015   Chronic constipation 01/22/2015   COPD (chronic obstructive pulmonary disease) ([place]) 01/22/2015   ED (erectile dysfunction) of organic origin 01/22/2015   Lymphedema of both lower extremities 01/22/2015   Fatigue 01/22/2015   Blood in the urine 01/22/2015   Calculus of kidney 01/22/2015   Memory loss 01/22/2015   Hypertensive pulmonary vascular disease ([place]) 01/22/2015   Displacement of cervical intervertebral disc without myelopathy 01/22/2015   Seizure disorder ([place]) 01/17/2015   Obstructive sleep apnea 01/17/2015   Hyperlipidemia 01/17/2015   Leg swelling 01/17/2015   Lumbar degenerative disc disease 01/17/2015   Back pain 11/12/2014   TI (tricuspid incompetence) 08/02/2014    Past Surgical History:  Procedure Laterality Date   CATARACT EXTRACTION     CORONARY ARTERY BYPASS GRAFT  10/1997   EYE SURGERY     history of cervical discectomy  2009   C5-C6   Hyperplastic colon polyp  02/2002   Sigmoid polyps  KYPHOPLASTY N/A 02/06/2020   Procedure: T12 & L3 Kyphoplasty;  Surgeon: Hessie Knows, MD;  Location: [facility] ORS;  Service: Orthopedics;  Laterality: N/A;    Prior to Admission medications   Medication Sig Start Date End Date Taking? Authorizing Provider  allopurinol (ZYLOPRIM) 100 MG tablet Take 2 tablets (200 mg total) by mouth  daily. 02/15/20   Birdie Sons, MD  Cholecalciferol (VITAMIN D-3) 125 MCG (5000 UT) TABS Take 5,000 Units by mouth daily.    [provider]  gabapentin (NEURONTIN) 300 MG capsule Take 1 capsule (300 mg total) by mouth at bedtime. 12/17/20   Domenic Polite, MD  metoprolol succinate (TOPROL-XL) 25 MG 24 hr tablet TAKE 1/2 A TABLET BY MOUTH DAILY Patient taking differently: Take 12.5 mg by mouth daily. 01/04/20   Birdie Sons, MD  oxyCODONE (ROXICODONE) 15 MG immediate release tablet TAKE 1 TABLET BY MOUTH EVERY 4 HOURS AS NEEDED Patient taking differently: Take 15 mg by mouth every 4 (four) hours as needed for pain. 12/12/20   Birdie Sons, MD  polyethylene glycol [facility] / Floria Raveling) packet Take 17 g by mouth daily. Patient not taking: Reported on 12/27/2020 12/08/16   Gladstone Lighter, MD  PROCTOZONE-HC 2.5 % rectal cream PLACE 1 APPLICATION RECTALLY TWICE DAILYAS DIRECTED Patient taking differently: Place 1 application rectally 2 (two) times daily as needed (discomfort/hemorrhoids). 10/16/19   Chrismon, Vickki Muff, PA-C  rosuvastatin (CRESTOR) 10 MG tablet TAKE 1 TABLET BY MOUTH ONCE DAILY 11/05/20   Birdie Sons, MD  spironolactone (ALDACTONE) 25 MG tablet Take 25 mg by mouth daily.    [provider]  torsemide (DEMADEX) 20 MG tablet Take 0.5 tablets (10 mg total) by mouth daily. Take one daily as needed for excessive swelling 12/17/20   Domenic Polite, MD  vitamin B-12 (CYANOCOBALAMIN) 1000 MCG tablet Take 1 tablet (1,000 mcg total) by mouth daily. 12/17/20 02/15/21  Domenic Polite, MD    Allergies Patient has no known allergies.  Family History  Problem Relation Age of Onset   Hypertension Mother    Hypertension Father     Social History Social History   Tobacco Use   Smoking status: Former   Smokeless tobacco: Never   Tobacco comments:    Quit in 1990  Vaping Use   Vaping Use: Never used  Substance Use Topics   Alcohol use: No   Drug use: No     Review of Systems  Constitutional: No fever/chills Eyes: No visual changes. ENT: No sore throat. Cardiovascular: Denies chest pain. Respiratory: Positive for shortness of breath. Gastrointestinal: No abdominal pain.  No nausea, no vomiting.  No diarrhea.  No constipation. Genitourinary: Negative for dysuria. Musculoskeletal: Negative for back pain. Skin: Negative for rash. Neurological: Negative for headaches, focal weakness or numbness. ____________________________________________   PHYSICAL EXAM:  Today's Vitals   12/30/20 1500 12/30/20 1520 12/30/20 1536 12/30/20 1545  BP: 134/77  118/76 113/78  Pulse:  98 96 88  Resp: (!) 27 20 (!) 24 17  Temp:   98.1 F (36.7 C)   TempSrc:   Oral   SpO2:  100% 100% 100%  Weight:      Height:       Body mass index is 27.98 kg/m.    Constitutional: Alert and oriented. Chronically ill appearing and in no acute distress. Eyes: Conjunctivae are normal. PERRL. EOMI. Head: Atraumatic. Nose: No congestion/rhinnorhea. Mouth/Throat: Mucous membranes are moist.  Oropharynx non-erythematous. Neck: No stridor. No JVD. Hematological/Lymphatic/Immunilogical: No cervical lymphadenopathy. Cardiovascular:  Normal rate, regular rhythm. Grossly normal heart sounds.  Good peripheral circulation. Respiratory: Normal respiratory effort.  No retractions. Lungs diminished with crackles in the right lower lobe. Gastrointestinal: Soft and nontender. No distention. No abdominal bruits. Genitourinary:  Musculoskeletal: No lower extremity tenderness with bilateral nonpitting edema.  No joint effusions. Neurologic:  Normal speech and language. No gross focal neurologic deficits are appreciated.  Skin:  Skin is warm, dry and intact.  Psychiatric: Mood and affect are normal. Speech and behavior are normal.  ____________________________________________   LABS (all labs ordered are listed, but only abnormal results are displayed)  Labs Reviewed   COMPREHENSIVE METABOLIC PANEL - Abnormal; Notable for the following components:      Result Value   Glucose, Bld 100 (*)    BUN 46 (*)    Creatinine, Ser 1.94 (*)    Calcium 8.6 (*)    Albumin 3.4 (*)    Total Bilirubin 1.4 (*)    GFR, Estimated 34 (*)    All other components within normal limits  CBC WITH DIFFERENTIAL/PLATELET - Abnormal; Notable for the following components:   WBC 1.7 (*)    RBC 2.20 (*)    Hemoglobin 7.2 (*)    HCT 21.9 (*)    RDW 21.8 (*)    Platelets 16 (*)    nRBC 1.8 (*)    Neutro Abs 0.4 (*)    Lymphs Abs 0.6 (*)    All other components within normal limits  BRAIN NATRIURETIC PEPTIDE - Abnormal; Notable for the following components:   B Natriuretic Peptide 120.7 (*)    All other components within normal limits  BLOOD GAS, VENOUS - Abnormal; Notable for the following components:   pCO2, Ven 34 (*)    pO2, Ven <31.0 (*)    Bicarbonate 17.1 (*)    Acid-base deficit 8.2 (*)    All other components within normal limits  TROPONIN I (HIGH SENSITIVITY) - Abnormal; Notable for the following components:   Troponin I (High Sensitivity) 36 (*)    All other components within normal limits  TROPONIN I (HIGH SENSITIVITY) - Abnormal; Notable for the following components:   Troponin I (High Sensitivity) 30 (*)    All other components within normal limits  RESP PANEL BY RT-PCR (FLU A&B, COVID) ARPGX2  CULTURE, BLOOD (ROUTINE X 2)  CULTURE, BLOOD (ROUTINE X 2)  PREPARE RBC (CROSSMATCH)  TYPE AND SCREEN   ____________________________________________  EKG  ED ECG REPORT I, Wendell Fiebig, FNP-BC personally viewed and interpreted this ECG.   Date: 12/30/2020  EKG Time: 1148  Rate: 104  Rhythm: atrial fibrillation, rate uncontrolled  Axis: rightward  Intervals:none  ST&T Change: no ST elevation  ____________________________________________  RADIOLOGY  ED MD interpretation:    Worsening interstitial bilateral opacities versus cardiogenic edema.  I,  Sherrie George, personally viewed and evaluated these images (plain radiographs) as part of my medical decision making, as well as reviewing the written report by the radiologist.  Official radiology report(s): DG Chest 1 View  Result Date: 12/30/2020 CLINICAL DATA:  Shortness of breath, worsening. EXAM: CHEST  1 VIEW COMPARISON:  11/29/2020 and 12/14/2020 FINDINGS: Substantially progressive bilateral interstitial lung patchy airspace opacities distributed throughout both lungs. No compelling prominence of Kerley B lines. Mild enlargement of the cardiopericardial silhouette is present. No blunting of the costophrenic angles. Prior CABG. Atherosclerotic calcification of the aortic arch. IMPRESSION: 1. Substantially worsened bilateral interstitial and patchy bilateral airspace opacities, with possibilities including progressive multilobar atypical pneumonia or cardiogenic edema.  Cardiogenic edema seems less likely given the paucity of Kerley B lines. However, there is mild enlargement of the cardiopericardial silhouette and the patient has had prior CABG. 2.  Aortic Atherosclerosis (ICD10-I70.0). Electronically Signed   By: Van Clines M.D.   On: 12/30/2020 12:34   CT Angio Chest PE W and/or Wo Contrast  Result Date: 12/30/2020 CLINICAL DATA:  Pulmonary emboli suspected. High probability. Worsening shortness of breath. EXAM: CT ANGIOGRAPHY CHEST WITH CONTRAST TECHNIQUE: Multidetector CT imaging of the chest was performed using the standard protocol during bolus administration of intravenous contrast. Multiplanar CT image reconstructions and MIPs were obtained to evaluate the vascular anatomy. CONTRAST:  68m OMNIPAQUE IOHEXOL 350 MG/ML SOLN COMPARISON:  Chest radiography same day.  CT chest 12/14/2020. FINDINGS: Cardiovascular: Heart is enlarged. There is four-chamber enlargement, but particular enlargement of the right atrium. There is no pericardial fluid. There is been previous median sternotomy and  CABG. Pulmonary arterial opacification is good. There are no pulmonary emboli. Mediastinum/Nodes: No mediastinal or hilar mass or lymphadenopathy. Lungs/Pleura: Considerable worsening of bilateral pulmonary infiltrates with a geographic pattern. Findings suggest viral pneumonia, possibly coronavirus. Small amount of pleural fluid on the left. No dense lobar consolidation or collapse. Upper Abdomen: No acute finding. Nonobstructing renal stone on the right. Chronic 2.8 cm fatty mass of the left adrenal gland. Musculoskeletal: Chronic thoracic degenerative changes. Old T12 compression fracture treated with vertebral augmentation. Review of the MIP images confirms the above findings. IMPRESSION: No pulmonary emboli. Marked worsening of diffuse geographic pulmonary infiltrates consistent with worsening atypical pneumonia, possibly coronavirus. Four-chamber cardiac enlargement, with particular enlargement of the right atrium. Aortic Atherosclerosis (ICD10-I70.0). Electronically Signed   By: MNelson ChimesM.D.   On: 12/30/2020 15:19    ____________________________________________   PROCEDURES  Procedure(s) performed (including Critical Care):  Procedures  ____________________________________________   INITIAL IMPRESSION / ASSESSMENT AND PLAN     85year old male presenting to the emergency department from home via EMS for increasing shortness of breath over the past 48 hours.  See HPI for further details.  Patient states that he lives at home with his wife.  He was scheduled to have a telehealth visit at 2 PM with oncology to assess need for home oxygen. Shortness of breath worsened and EMS was called to bring him to the ER.   DIFFERENTIAL DIAGNOSIS  Symptomatic anemia, PE, cardiac event, CHF  ED COURSE  Clinical Course as of 12/30/20 1549  Mon Dec 30, 2020  1158 Room air saturation of 88%.  Patient placed on 2 L nasal cannula.  Will monitor. [CT]  1301 Chest x-ray reviewed in addition to  radiology reading.  Concern for worsening interstitial and bilateral opacities versus cardiogenic edema.  Awaiting results of CBC and COVID-19 testing.  Oxygen saturation is currently stable at 96% on 2 L. [CT]  1323 Critical platelet count of 16 in comparison to 18 three days ago. Hemoglobin 7.2 with hematocrit of 21.9. Will consult with oncology.  [CT]  1404 Vitals including oxygen saturation remain stable. 97% with 2 liters [place]. Awaiting CTA chest results. [CT]  11443CTA results without evidence for PE. Antibiotics ordered. Oncology just returned page--advise to transfuse 1 unit PRBC and hold off on platelets. Nursing in starting blood now. Patient consents to transfusion. Will admit via hospitalist service. [CT]    Clinical Course User Index [CT] Peytan Andringa B, FNP    CRITICAL CARE Performed by: CSherrie George  Total critical care time: 60 minutes  Critical care time  was exclusive of separately billable procedures and treating other patients.  Critical care was necessary to treat or prevent imminent or life-threatening deterioration.  Critical care was time spent personally by me on the following activities: development of treatment plan with patient and/or surrogate as well as nursing, discussions with consultants, evaluation of patient's response to treatment, examination of patient, obtaining history from patient or surrogate, ordering and performing treatments and interventions, ordering and review of laboratory studies, ordering and review of radiographic studies, pulse oximetry and re-evaluation of patient's condition.  ___________________________________________   FINAL CLINICAL IMPRESSION(S) / ED DIAGNOSES  Final diagnoses:  Symptomatic anemia  Neutropenia associated with infection ([place])  Multifocal pneumonia  Acute respiratory failure with hypoxia [facility])     ED Discharge Orders     None        Vincent Mason was evaluated in Emergency Department on 12/30/2020 for  the symptoms described in the history of present illness. He was evaluated in the context of the global COVID-19 pandemic, which necessitated consideration that the patient might be at risk for infection with the SARS-CoV-2 virus that causes COVID-19. Institutional protocols and algorithms that pertain to the evaluation of patients at risk for COVID-19 are in a state of rapid change based on information released by regulatory bodies including the CDC and federal and state organizations. These policies and algorithms were followed during the patient's care in the ED.   Note:  This document was prepared using Dragon voice recognition software and may include unintentional dictation errors.    Victorino Dike, FNP 12/30/20 1550    Carrie Mew, MD 12/31/20 [phone]

## 2020-12-30 NOTE — ED Notes (Signed)
Per MD Tonia Brooms - hold second unit of RBC until otherwise told.

## 2020-12-30 NOTE — ED Notes (Signed)
CT notified of [place] PIV placement.

## 2020-12-30 NOTE — H&P (Signed)
History and Physical  MIRZA FESSEL AVW:[id number] DOB: 1935-07-09 DOA: 12/30/2020  Referring physician: Dr. Vanessa [place], [place]. PCP: Birdie Sons, MD  Outpatient Specialists: Hematology oncology. Patient coming from: Home.  Chief Complaint: Shortness of breath.  HPI: JASHAD DEPAULA is a 85 y.o. 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 fib not on oral anticoagulation, CKD 3B, chronic back pain, recent admission 7/23-7/26 for hemoptysis, who presented to [facility] ED due to worsening shortness of breath.  Follows with oncology, last visit was on 12/27/2020 due to dyspnea, generalized fatigue, and mild hemoptysis.  At that time he was transfused 1 unit of PRBC for hemoglobin of 7.1 and 1 unit of platelet for platelet count of 18,000.  Per review of medical records, oncology has plan to start palliative chemotherapy on 01/06/2021.  He denies subjective fever, chest pain, or lower extremity tenderness.  Upon presentation to the ED hypoxic with O2 saturation of 88% on room air.  Work-up in the ED revealed multifocal pneumonia as seen on chest x-ray and on CT scan.  COVID-19 screening test negative.  Work-up also revealed severe pancytopenia including severe neutropenia with ANC less than 400.  Hematology oncology has been consulted.  Patient admitted under hospitalist service.  ED Course: Temperature 98.2.  BP 111/62, pulse 99, respiration rate 23, O2 saturation 100% on 3 L nasal cannula.  Lab studies remarkable for WBC 1.7, hemoglobin 7.2, platelet count 16,000, neutrophil count 400.  Review of Systems: Review of systems as noted in the HPI. All other systems reviewed and are negative.   Past Medical History:  Diagnosis Date   Acute encephalopathy 09/03/2015   Atrial fibrillation and flutter [facility]) 2013   Dr Nehemiah Massed at [facility]  on [place].    C. difficile colitis 09/12/2015   COPD (chronic obstructive pulmonary disease) (HCC)    HCAP  (healthcare-associated pneumonia) 09/12/2015   Hyperlipidemia    Hypertension    Kidney disease, chronic, stage III (moderate, EGFR 30-59 ml/min) ([place])    ARF 2013   OSA (obstructive sleep apnea)    non-compliant with CPAP.    Severe sepsis with septic shock ([place]) 09/12/2015   Past Surgical History:  Procedure Laterality Date   CATARACT EXTRACTION     CORONARY ARTERY BYPASS GRAFT  10/1997   EYE SURGERY     history of cervical discectomy  2009   C5-C6   Hyperplastic colon polyp  02/2002   Sigmoid polyps   KYPHOPLASTY N/A 02/06/2020   Procedure: T12 & L3 Kyphoplasty;  Surgeon: Hessie Knows, MD;  Location: [facility] ORS;  Service: Orthopedics;  Laterality: N/A;    Social History:  reports that he has quit smoking. He has never used smokeless tobacco. He reports that he does not drink alcohol and does not use drugs.   No Known Allergies  Family History  Problem Relation Age of Onset   Hypertension Mother    Hypertension Father       Prior to Admission medications   Medication Sig Start Date End Date Taking? Authorizing Provider  allopurinol (ZYLOPRIM) 100 MG tablet Take 2 tablets (200 mg total) by mouth daily. 02/15/20   Birdie Sons, MD  Cholecalciferol (VITAMIN D-3) 125 MCG (5000 UT) TABS Take 5,000 Units by mouth daily.    [provider]  gabapentin (NEURONTIN) 300 MG capsule Take 1 capsule (300 mg total) by mouth at bedtime. 12/17/20   Domenic Polite, MD  metoprolol succinate (TOPROL-XL) 25 MG  24 hr tablet TAKE 1/2 A TABLET BY MOUTH DAILY Patient taking differently: Take 12.5 mg by mouth daily. 01/04/20   Birdie Sons, MD  oxyCODONE (ROXICODONE) 15 MG immediate release tablet TAKE 1 TABLET BY MOUTH EVERY 4 HOURS AS NEEDED Patient taking differently: Take 15 mg by mouth every 4 (four) hours as needed for pain. 12/12/20   Birdie Sons, MD  polyethylene glycol [facility] / Floria Raveling) packet Take 17 g by mouth daily. Patient not taking: Reported on 12/27/2020 12/08/16    Gladstone Lighter, MD  PROCTOZONE-HC 2.5 % rectal cream PLACE 1 APPLICATION RECTALLY TWICE DAILYAS DIRECTED Patient taking differently: Place 1 application rectally 2 (two) times daily as needed (discomfort/hemorrhoids). 10/16/19   Chrismon, Vickki Muff, PA-C  rosuvastatin (CRESTOR) 10 MG tablet TAKE 1 TABLET BY MOUTH ONCE DAILY 11/05/20   Birdie Sons, MD  spironolactone (ALDACTONE) 25 MG tablet Take 25 mg by mouth daily.    [provider]  torsemide (DEMADEX) 20 MG tablet Take 0.5 tablets (10 mg total) by mouth daily. Take one daily as needed for excessive swelling 12/17/20   Domenic Polite, MD  vitamin B-12 (CYANOCOBALAMIN) 1000 MCG tablet Take 1 tablet (1,000 mcg total) by mouth daily. 12/17/20 02/15/21  Domenic Polite, MD    Physical Exam: BP 131/79   Pulse 91   Temp 98.2 F (36.8 C) (Oral)   Resp 18   Ht $R'5\' 10"'Yl$  (1.778 m)   Wt 88.5 kg   SpO2 100%   BMI 27.98 kg/m   General: 85 y.o. year-old male well developed well nourished in no acute distress.  Alert and oriented x3. Cardiovascular: Irregular rate and rhythm with no rubs or gallops.  No thyromegaly or JVD noted.  No lower extremity edema. 2/4 pulses in all 4 extremities. Respiratory: Diffuse rales bilaterally, diffuse wheezes bilaterally. Good inspiratory effort. Abdomen: Soft nontender nondistended with normal bowel sounds x4 quadrants. Muskuloskeletal: No cyanosis, clubbing or edema noted bilaterally Neuro: CN II-XII intact, strength, sensation, reflexes Skin: No ulcerative lesions noted or rashes Psychiatry: Judgement and insight appear normal. Mood is appropriate for condition and setting          Labs on Admission:  Basic Metabolic Panel: Recent Labs  Lab 12/27/20 0922 12/30/20 1200  NA 136 137  K 4.3 4.9  CL 102 108  CO2 24 23  GLUCOSE 111* 100*  BUN 34* 46*  CREATININE 1.74* 1.94*  CALCIUM 8.6* 8.6*   Liver Function Tests: Recent Labs  Lab 12/30/20 1200  AST 23  ALT 14  ALKPHOS 46  BILITOT  1.4*  PROT 6.6  ALBUMIN 3.4*   No results for input(s): LIPASE, AMYLASE in the last 168 hours. No results for input(s): AMMONIA in the last 168 hours. CBC: Recent Labs  Lab 12/27/20 0922 12/30/20 1200  WBC 2.0* 1.7*  NEUTROABS 0.5* 0.4*  HGB 7.1* 7.2*  HCT 21.7* 21.9*  MCV 103.8* 99.5  PLT 18* 16*   Cardiac Enzymes: No results for input(s): CKTOTAL, CKMB, CKMBINDEX, TROPONINI in the last 168 hours.  BNP (last 3 results) Recent Labs    12/14/20 1245 12/30/20 1200  BNP 100.4* 120.7*    ProBNP (last 3 results) No results for input(s): PROBNP in the last 8760 hours.  CBG: No results for input(s): GLUCAP in the last 168 hours.  Radiological Exams on Admission: DG Chest 1 View  Result Date: 12/30/2020 CLINICAL DATA:  Shortness of breath, worsening. EXAM: CHEST  1 VIEW COMPARISON:  11/29/2020 and 12/14/2020 FINDINGS: Substantially  progressive bilateral interstitial lung patchy airspace opacities distributed throughout both lungs. No compelling prominence of Kerley B lines. Mild enlargement of the cardiopericardial silhouette is present. No blunting of the costophrenic angles. Prior CABG. Atherosclerotic calcification of the aortic arch. IMPRESSION: 1. Substantially worsened bilateral interstitial and patchy bilateral airspace opacities, with possibilities including progressive multilobar atypical pneumonia or cardiogenic edema. Cardiogenic edema seems less likely given the paucity of Kerley B lines. However, there is mild enlargement of the cardiopericardial silhouette and the patient has had prior CABG. 2.  Aortic Atherosclerosis (ICD10-I70.0). Electronically Signed   By: Van Clines M.D.   On: 12/30/2020 12:34   CT Angio Chest PE W and/or Wo Contrast  Result Date: 12/30/2020 CLINICAL DATA:  Pulmonary emboli suspected. High probability. Worsening shortness of breath. EXAM: CT ANGIOGRAPHY CHEST WITH CONTRAST TECHNIQUE: Multidetector CT imaging of the chest was performed using  the standard protocol during bolus administration of intravenous contrast. Multiplanar CT image reconstructions and MIPs were obtained to evaluate the vascular anatomy. CONTRAST:  23mL OMNIPAQUE IOHEXOL 350 MG/ML SOLN COMPARISON:  Chest radiography same day.  CT chest 12/14/2020. FINDINGS: Cardiovascular: Heart is enlarged. There is four-chamber enlargement, but particular enlargement of the right atrium. There is no pericardial fluid. There is been previous median sternotomy and CABG. Pulmonary arterial opacification is good. There are no pulmonary emboli. Mediastinum/Nodes: No mediastinal or hilar mass or lymphadenopathy. Lungs/Pleura: Considerable worsening of bilateral pulmonary infiltrates with a geographic pattern. Findings suggest viral pneumonia, possibly coronavirus. Small amount of pleural fluid on the left. No dense lobar consolidation or collapse. Upper Abdomen: No acute finding. Nonobstructing renal stone on the right. Chronic 2.8 cm fatty mass of the left adrenal gland. Musculoskeletal: Chronic thoracic degenerative changes. Old T12 compression fracture treated with vertebral augmentation. Review of the MIP images confirms the above findings. IMPRESSION: No pulmonary emboli. Marked worsening of diffuse geographic pulmonary infiltrates consistent with worsening atypical pneumonia, possibly coronavirus. Four-chamber cardiac enlargement, with particular enlargement of the right atrium. Aortic Atherosclerosis (ICD10-I70.0). Electronically Signed   By: Nelson Chimes M.D.   On: 12/30/2020 15:19    EKG: I independently viewed the EKG done and my findings are as followed: A. fib rate of 104.  Nonspecific ST-T changes.  QTc 477.  Assessment/Plan Present on Admission:  Multifocal pneumonia  Active Problems:   Multifocal pneumonia  Sepsis secondary to multifocal pneumonia in the setting of newly diagnosed high-grade MDS with severe leukopenia and neutropenia, POA Presented with worsening shortness of  breath, WBC 1.7, pulse 109, respiration rate 27, multifocal infiltrates on chest x-ray and CT scan. O2 saturation 88% on room air Personally reviewed chest x-ray and CT scan with evidence of multifocal pulmonary infiltrates. COVID-19 screening test negative Peripheral blood cultures x2 and sputum culture ordered, follow Started on broad-spectrum IV antibiotics, IV vancomycin, cefepime, IV azithromycin Pulmonary toilet, bronchodilators scheduled and as needed Obtain MRSA screening test Monitor fever curve and WBC Obtain CBC with differential in the morning and procalcitonin level.  Acute hypoxic respiratory failure secondary to multifocal pneumonia. Not on oxygen supplementation at baseline O2 saturation 88% on room air Currently on 3 L with O2 saturation of 100% Continue to treat underlying condition Wean of oxygen supplementation as tolerated.  Newly diagnosed high-grade MDS Diagnosed on 12/20/2020 Follows with hematology oncology, last appointment was on 12/27/2020 in which she received 1 unit PRBC and 1 unit of platelet as stated in the HPI. Hematology/oncology consulted and will see in consultation.  Severe pancytopenia secondary to MDS 2 units  PRBC ordered in the ED for transfusion for hemoglobin of 7.2. 1 unit platelet irradiated as recommended by oncology ordered to be transfused. Repeat CBC with differential in the morning Rest of management per hematology oncology.  Elevated troponin, suspect demand ischemia in the setting of anemia requiring blood transfusion Troponin peaked at 36, down trending No evidence of acute ischemia on twelve-lead EKG. Maintain hemoglobin greater than 8.0 Monitor on telemetry  HFrEF 45 to 50% Last 2D echo on 01/21/2017 revealed LVEF 45 to 50% Euvolemic on exam Hold off cardiac medications for now due to soft Bps Give a dose of IV Lasix between blood transfusions to avoid pulmonary edema. Start strict I's and O's and daily weight  Chronic A.  fib not on anticoagulation Resume home beta-blocker at lower dose, to avoid beta-blocker withdrawal and rebound tachycardia. Home Eliquis was discontinued (7/23) at previous admission due to severe pancytopenia Monitor on telemetry IV Lopressor as needed with parameters  CAD s/p CABG, PCI No anginal symptoms Resume home Toprol XL 12.5 mg daily and home statin therapy  CKD 3B Appears to be at his baseline creatinine 1.9 with GFR of 34 Avoid nephrotoxic agents Monitor urine output Repeat renal function in the morning  OSA CPAP qhs  Isolated T bilirubin Nonspecific Repeat level in the morning  Goals of care Palliative care team consulted to assist with establishing goals of care. Full code     DVT prophylaxis: SCDs.  Code Status: Full code  Family Communication: None at bedside  Disposition Plan: Admitted to stepdown unit  Consults called: Hematology/oncology.  Admission status: Inpatient status.  Patient will require at least 2 midnights for further evaluation and treatment of present condition.   Status is: Inpatient   Dispo:  Patient From: Home  Planned Disposition: Home when hematology oncology signs off.  Medically stable for discharge: No         Kayleen Memos MD [facility] Pager [phone]  If 7PM-7AM, please contact night-coverage [email or web address] Password [email or web address]  12/30/2020, 4:11 PM

## 2020-12-30 NOTE — ED Notes (Signed)
Pt transported to CT via stretcher at this time.  

## 2020-12-30 NOTE — Telephone Encounter (Signed)
Added to 01/06/21 schedule for [facility]

## 2020-12-30 NOTE — ED Notes (Signed)
Pt provided with [place] sandwich tray and milk per request.

## 2020-12-30 NOTE — Telephone Encounter (Signed)
Pt son also left a Vm that his father will not be able to attend his Chemo class today. Please contact so at [phone] to possibly discuss Pallative Care per his message.

## 2020-12-30 NOTE — Telephone Encounter (Signed)
Called pt's son, [place], and scheduled MyChart visit for 2pm today with [employer]. Suggested that family check pulse ox at home and informed on where to purchase device. Son verbalized agreement and stated he would go purchase one.

## 2020-12-30 NOTE — Telephone Encounter (Signed)
-----   Message from Cammie Sickle, MD sent at 12/28/2020  7:21 PM EDT ----- Regarding: Prophylactic antibiotics/port placement H/T-patient's port placement should be coordinated with platelet transfusion.  Please let me know when the port is scheduled.  We will have to figure out platelet transfusion prior.  J/A- patient will need prophylactic antibiotics/based on renal function-Levaquin/posaconazole/acyclovir. A- please help with the dosing.  Thanks, GB

## 2020-12-30 NOTE — ED Notes (Signed)
Pt remains on a bedpan that was placed under him by dayshift - Pt declines to be removed from the  Bedpan until having a BM. Pt was educated on the potential discomfort and skin breakdown this could cause and patient stated "I will not get off until I am done." RN will continue to monitor and check for skin break down and discuss with MD a bowel regimen to help patient.

## 2020-12-30 NOTE — ED Notes (Signed)
Meal tray delivered to pt at this time.  

## 2020-12-30 NOTE — ED Notes (Signed)
Phlebotomy to obtain labs. 

## 2020-12-30 NOTE — Progress Notes (Signed)
PHARMACY NOTE:  ANTIMICROBIAL RENAL DOSAGE ADJUSTMENT  Current antimicrobial regimen includes a mismatch between antimicrobial dosage and estimated renal function.  As per policy approved by the Pharmacy & Therapeutics and Medical Executive Committees, the antimicrobial dosage will be adjusted accordingly.  Current antimicrobial dosage:  Cefepime 2gms q8hrs  Indication: CAP  Renal Function: CrCl=32.33m/min  Estimated Creatinine Clearance: 32.3 mL/min (A) (by C-G formula based on SCr of 1.94 mg/dL (H)). '[]'$      On intermittent HD, scheduled: '[]'$      On CRRT    Antimicrobial dosage has been changed to:  Cefepime 2gms q12hrs  Additional comments:   Thank you for allowing pharmacy to be a part of this patient's care.  SBerta Minor RShepherd Eye Surgicenter8/12/2020 4:15 PM

## 2020-12-31 ENCOUNTER — Encounter: Payer: Self-pay | Admitting: Internal Medicine

## 2020-12-31 DIAGNOSIS — J189 Pneumonia, unspecified organism: Secondary | ICD-10-CM

## 2020-12-31 DIAGNOSIS — D469 Myelodysplastic syndrome, unspecified: Secondary | ICD-10-CM

## 2020-12-31 DIAGNOSIS — Z515 Encounter for palliative care: Secondary | ICD-10-CM | POA: Diagnosis not present

## 2020-12-31 DIAGNOSIS — N183 Chronic kidney disease, stage 3 unspecified: Secondary | ICD-10-CM

## 2020-12-31 DIAGNOSIS — I482 Chronic atrial fibrillation, unspecified: Secondary | ICD-10-CM

## 2020-12-31 DIAGNOSIS — J9601 Acute respiratory failure with hypoxia: Secondary | ICD-10-CM | POA: Diagnosis present

## 2020-12-31 DIAGNOSIS — D4620 Refractory anemia with excess of blasts, unspecified: Secondary | ICD-10-CM

## 2020-12-31 DIAGNOSIS — J9600 Acute respiratory failure, unspecified whether with hypoxia or hypercapnia: Secondary | ICD-10-CM

## 2020-12-31 DIAGNOSIS — A419 Sepsis, unspecified organism: Secondary | ICD-10-CM

## 2020-12-31 DIAGNOSIS — D709 Neutropenia, unspecified: Secondary | ICD-10-CM

## 2020-12-31 LAB — PREPARE PLATELET PHERESIS: Unit division: 0

## 2020-12-31 LAB — COMPREHENSIVE METABOLIC PANEL
ALT: 16 U/L (ref 0–44)
AST: 28 U/L (ref 15–41)
Albumin: 3.1 g/dL — ABNORMAL LOW (ref 3.5–5.0)
Alkaline Phosphatase: 39 U/L (ref 38–126)
Anion gap: 10 (ref 5–15)
BUN: 54 mg/dL — ABNORMAL HIGH (ref 8–23)
CO2: 21 mmol/L — ABNORMAL LOW (ref 22–32)
Calcium: 8.1 mg/dL — ABNORMAL LOW (ref 8.9–10.3)
Chloride: 106 mmol/L (ref 98–111)
Creatinine, Ser: 1.84 mg/dL — ABNORMAL HIGH (ref 0.61–1.24)
GFR, Estimated: 36 mL/min — ABNORMAL LOW (ref >60)
Glucose, Bld: 141 mg/dL — ABNORMAL HIGH (ref 70–99)
Potassium: 4.8 mmol/L (ref 3.5–5.1)
Sodium: 137 mmol/L (ref 135–145)
Total Bilirubin: 1.4 mg/dL — ABNORMAL HIGH (ref 0.3–1.2)
Total Protein: 6 g/dL — ABNORMAL LOW (ref 6.5–8.1)

## 2020-12-31 LAB — CBC WITH DIFFERENTIAL/PLATELET
Abs Immature Granulocytes: 0.01 10*3/uL (ref 0.00–0.07)
Basophils Absolute: 0 10*3/uL (ref 0.0–0.1)
Basophils Relative: 0 %
Eosinophils Absolute: 0 10*3/uL (ref 0.0–0.5)
Eosinophils Relative: 0 %
HCT: 22 % — ABNORMAL LOW (ref 39.0–52.0)
Hemoglobin: 7.3 g/dL — ABNORMAL LOW (ref 13.0–17.0)
Immature Granulocytes: 1 %
Lymphocytes Relative: 40 %
Lymphs Abs: 0.5 10*3/uL — ABNORMAL LOW (ref 0.7–4.0)
MCH: 32.4 pg (ref 26.0–34.0)
MCHC: 33.2 g/dL (ref 30.0–36.0)
MCV: 97.8 fL (ref 80.0–100.0)
Monocytes Absolute: 0.3 10*3/uL (ref 0.1–1.0)
Monocytes Relative: 24 %
Neutro Abs: 0.4 10*3/uL — CL (ref 1.7–7.7)
Neutrophils Relative %: 35 %
Platelets: 33 10*3/uL — ABNORMAL LOW (ref 150–400)
RBC: 2.25 MIL/uL — ABNORMAL LOW (ref 4.22–5.81)
RDW: 21.7 % — ABNORMAL HIGH (ref 11.5–15.5)
Smear Review: NORMAL
WBC: 1.1 10*3/uL — CL (ref 4.0–10.5)
nRBC: 3.5 % — ABNORMAL HIGH (ref 0.0–0.2)

## 2020-12-31 LAB — PREPARE RBC (CROSSMATCH)

## 2020-12-31 LAB — BPAM PLATELET PHERESIS
Blood Product Expiration Date: 202208112359
ISSUE DATE / TIME: 202208081727
Unit Type and Rh: 6200

## 2020-12-31 LAB — LACTATE DEHYDROGENASE: LDH: 288 U/L — ABNORMAL HIGH (ref 98–192)

## 2020-12-31 LAB — HIV ANTIBODY (ROUTINE TESTING W REFLEX): HIV Screen 4th Generation wRfx: NONREACTIVE

## 2020-12-31 LAB — MAGNESIUM: Magnesium: 2.4 mg/dL (ref 1.7–2.4)

## 2020-12-31 LAB — PROCALCITONIN: Procalcitonin: 0.8 ng/mL

## 2020-12-31 LAB — PHOSPHORUS: Phosphorus: 4.2 mg/dL (ref 2.5–4.6)

## 2020-12-31 MED ORDER — VANCOMYCIN HCL IN DEXTROSE 1-5 GM/200ML-% IV SOLN
1000.0000 mg | INTRAVENOUS | Status: DC
  Administered 2020-12-31: 1000 mg via INTRAVENOUS
  Filled 2020-12-31 (×2): qty 200

## 2020-12-31 MED ORDER — LACTULOSE 10 GM/15ML PO SOLN
20.0000 g | Freq: Once | ORAL | Status: DC
  Filled 2020-12-31: qty 30

## 2020-12-31 MED ORDER — SODIUM CHLORIDE 0.9% IV SOLUTION
Freq: Once | INTRAVENOUS | Status: DC
  Filled 2020-12-31: qty 250

## 2020-12-31 MED ORDER — OXYCODONE HCL 5 MG PO TABS
15.0000 mg | ORAL_TABLET | ORAL | Status: DC | PRN
  Administered 2020-12-31 – 2021-01-05 (×16): 15 mg via ORAL
  Filled 2020-12-31 (×17): qty 3

## 2020-12-31 NOTE — Consult Note (Signed)
NAME: Vincent Mason  DOB: [birth year]  MRN: [id number]  Date/Time: 12/31/2020 11:34 PM  REQUESTING PROVIDER: Dr. Roosevelt Locks Subjective:  REASON FOR CONSULT: Multifocal pneumonia in a patient with neutropenia ? Vincent Mason is a 85 y.o. male with a history of CAD status post CABG, CKD, A. fib, recently diagnosed myelodysplastic syndrome, anemia presented from home with shortness of breath.  Patient was recently hospitalized between 12/14/2020 until 12/18/2020 for hemoptysis.  During that hospitalization he had abnormal lab work with a platelet count of 40,000, hemoglobin 6 and white count of 1.9.  CT scanning of the chest without contrast showed patchy groundglass opacities in both lungs with superimposed areas of more confluent nodular soft tissue density at the bases.  Also seen was diffuse bronchial wall thickening with cylindrical bronchiectasis bilaterally.  He underwent IR guided bone marrow biopsy on 12/16/2020.  Pathological diagnosis was high-grade MDS.  He has received blood transfusion.  He will follow up with oncologist as outpatient.  The plan was to start him on azacitidine.  He continued to have shortness of breath and also was getting a little bit of bloody sputum.  On 12/27/2020 he received platelet transfusion.  Patient came to the ED on 12/30/2020 with worsening shortness of breath.  In the ED BP 102/58 Temperature 98.4, heart rate 86 and respiratory 22.  Sats 99% on 2 L oxygen. Labs revealed a WBC of 1.7, hemoglobin 8 and platelets 16.  Creatinine was 1.94.  CT chest revealed considerable worsening of bilateral pulmonary infiltrates with geographic pattern.  The question was raised about viral pneumonia.  There was also four chamber dilatation.  I am asked to see the patient to rule out fungal infection  Patient is comfortable at rest No travel history No sick contacts  Past Medical History:  Diagnosis Date   Acute encephalopathy 09/03/2015   Atrial fibrillation and flutter [facility]) 2013   Dr  Nehemiah Massed at [facility]  on [place].    C. difficile colitis 09/12/2015   COPD (chronic obstructive pulmonary disease) (HCC)    HCAP (healthcare-associated pneumonia) 09/12/2015   Hyperlipidemia    Hypertension    Kidney disease, chronic, stage III (moderate, EGFR 30-59 ml/min) ([place])    ARF 2013   OSA (obstructive sleep apnea)    non-compliant with CPAP.    Severe sepsis with septic shock ([place]) 09/12/2015    Past Surgical History:  Procedure Laterality Date   CATARACT EXTRACTION     CORONARY ARTERY BYPASS GRAFT  10/1997   EYE SURGERY     history of cervical discectomy  2009   C5-C6   Hyperplastic colon polyp  02/2002   Sigmoid polyps   KYPHOPLASTY N/A 02/06/2020   Procedure: T12 & L3 Kyphoplasty;  Surgeon: Hessie Knows, MD;  Location: [facility] ORS;  Service: Orthopedics;  Laterality: N/A;    Social History   Socioeconomic History   Marital status: Married    Spouse name: Not on file   Number of children: 3   Years of education: some colle   Highest education level: Some college, no degree  Occupational History   Occupation: Retired  Tobacco Use   Smoking status: Former   Smokeless tobacco: Never   Tobacco comments:    Quit in [place] Use: Never used  Substance and Sexual Activity   Alcohol use: No   Drug use: No   Sexual activity: Yes  Other Topics Concern   Not on file  Social History Narrative  Lives in [place] with wife [facility] care giver- [employer]]; no smoking; no alcohol. 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    Social Determinants of Health   Financial Resource Strain: Not on file  Food Insecurity: Not on file  Transportation Needs: Not on file  Physical Activity: Not on file  Stress: Not on file  Social Connections: Not on file  Intimate Partner Violence: Not on file    Family History  Problem Relation Age of Onset   Hypertension Mother    Hypertension Father    No Known Allergies I? Current  Facility-Administered Medications  Medication Dose Route Frequency Provider Last Rate Last Admin   0.9 %  sodium chloride infusion (Manually program via Guardrails IV Fluids)   Intravenous Once Cammie Sickle, MD       acetaminophen (TYLENOL) tablet 650 mg  650 mg Oral Q6H PRN Kayleen Memos, DO       allopurinol (ZYLOPRIM) tablet 200 mg  200 mg Oral Daily Irene Pap N, DO   200 mg at 12/31/20 0929   azithromycin (ZITHROMAX) 500 mg in sodium chloride 0.9 % 250 mL IVPB  500 mg Intravenous Q24H Irene Pap N, DO 250 mL/hr at 12/31/20 1825 500 mg at 12/31/20 1825   ceFEPIme (MAXIPIME) 2 g in sodium chloride 0.9 % 100 mL IVPB  2 g Intravenous Q12H Irene Pap N, DO   Stopped at 12/31/20 2309   cholecalciferol (VITAMIN D) tablet 5,000 Units  5,000 Units Oral Daily Kayleen Memos, DO   5,000 Units at 12/31/20 8119   hydrocortisone (ANUSOL-HC) 2.5 % rectal cream 1 application  1 application Rectal BID PRN Irene Pap N, DO       ipratropium-albuterol (DUONEB) 0.5-2.5 (3) MG/3ML nebulizer solution 3 mL  3 mL Nebulization Q6H Hall, Carole N, DO   3 mL at 12/31/20 2021   ipratropium-albuterol (DUONEB) 0.5-2.5 (3) MG/3ML nebulizer solution 3 mL  3 mL Nebulization Q4H PRN Irene Pap N, DO       lactulose (CHRONULAC) 10 GM/15ML solution 20 g  20 g Oral Once Sharen Hones, MD       melatonin tablet 2.5 mg  2.5 mg Oral QHS PRN Irene Pap N, DO   2.5 mg at 12/30/20 2121   metoprolol tartrate (LOPRESSOR) injection 2.5 mg  2.5 mg Intravenous Q6H PRN Irene Pap N, DO       ondansetron (ZOFRAN) injection 4 mg  4 mg Intravenous Q6H PRN Irene Pap N, DO       oxyCODONE (Oxy IR/ROXICODONE) immediate release tablet 15 mg  15 mg Oral Q4H PRN Sharen Hones, MD   15 mg at 12/31/20 2146   polyethylene glycol (MIRALAX / GLYCOLAX) packet 17 g  17 g Oral Daily Borders, Kirt Boys, NP   17 g at 12/31/20 0935   rosuvastatin (CRESTOR) tablet 10 mg  10 mg Oral Daily Irene Pap N, DO   10 mg at 12/31/20 1478    senna-docusate (Senokot-S) tablet 1 tablet  1 tablet Oral QHS PRN Borders, Kirt Boys, NP       vancomycin (VANCOCIN) IVPB 1000 mg/200 mL premix  1,000 mg Intravenous Q24H Benita Gutter, RPH   Stopped at 12/31/20 2150   vitamin B-12 (CYANOCOBALAMIN) tablet 1,000 mcg  1,000 mcg Oral Daily Irene Pap N, DO   1,000 mcg at 12/31/20 2956     Abtx:  Anti-infectives (From admission, onward)    Start     Dose/Rate Route Frequency  Ordered Stop   01/01/21 1700  vancomycin (VANCOREADY) IVPB 1750 mg/350 mL  Status:  Discontinued        1,750 mg 175 mL/hr over 120 Minutes Intravenous Every 48 hours 12/30/20 1621 12/31/20 1549   12/31/20 2000  vancomycin (VANCOCIN) IVPB 1000 mg/200 mL premix        1,000 mg 200 mL/hr over 60 Minutes Intravenous Every 24 hours 12/31/20 1550     12/31/20 1000  ceFEPIme (MAXIPIME) 2 g in sodium chloride 0.9 % 100 mL IVPB  Status:  Discontinued        2 g 200 mL/hr over 30 Minutes Intravenous Every 12 hours 12/30/20 1612 12/30/20 1614   12/30/20 1700  vancomycin (VANCOREADY) IVPB 2000 mg/400 mL        2,000 mg 200 mL/hr over 120 Minutes Intravenous  Once 12/30/20 1621 12/30/20 2146   12/30/20 1613  ceFEPIme (MAXIPIME) 2 g in sodium chloride 0.9 % 100 mL IVPB        2 g 200 mL/hr over 30 Minutes Intravenous Every 12 hours 12/30/20 1614     12/30/20 1600  azithromycin (ZITHROMAX) 500 mg in sodium chloride 0.9 % 250 mL IVPB        500 mg 250 mL/hr over 60 Minutes Intravenous Every 24 hours 12/30/20 1556 01/03/21 1759   12/30/20 1600  ceFEPIme (MAXIPIME) 2 g in sodium chloride 0.9 % 100 mL IVPB  Status:  Discontinued        2 g 200 mL/hr over 30 Minutes Intravenous Every 8 hours 12/30/20 1556 12/30/20 1612   12/30/20 1545  cefTRIAXone (ROCEPHIN) 1 g in sodium chloride 0.9 % 100 mL IVPB  Status:  Discontinued        1 g 200 mL/hr over 30 Minutes Intravenous  Once 12/30/20 1534 12/30/20 1556   12/30/20 1545  azithromycin (ZITHROMAX) 500 mg in sodium chloride 0.9 % 250 mL  IVPB  Status:  Discontinued        500 mg 250 mL/hr over 60 Minutes Intravenous  Once 12/30/20 1534 12/30/20 1556       REVIEW OF SYSTEMS:  Const: negative fever, negative chills, negative weight loss Eyes: negative diplopia or visual changes, negative eye pain ENT: negative coryza, negative sore throat Resp: dry cough, hemoptysis, dyspnea Cards: negative for chest pain, palpitations, lower extremity edema GU: negative for frequency, dysuria and hematuria GI: Negative for abdominal pain, diarrhea, bleeding, constipation Skin: negative for rash and pruritus Heme: negative for easy bruising and gum/nose bleeding MS: Fatigue and weakness Neurolo:negative for headaches, dizziness, vertigo, memory problems  Psych: negative for feelings of anxiety, depression  Endocrine: negative for thyroid, diabetes Allergy/Immunology- negative for any medication or food allergies ? Pertinent Positives include : Objective:  VITALS:  BP 130/74 (BP Location: Right Arm)   Pulse 96   Temp 97.7 F (36.5 C)   Resp 20   Ht 5' 10" (1.778 m)   Wt 88.5 kg   SpO2 95%   BMI 27.98 kg/m  PHYSICAL EXAM:  General: Alert, cooperative, no distress, appears stated age.  Head: Normocephalic, without obvious abnormality, atraumatic. Eyes: Conjunctivae clear, anicteric sclerae. Pupils are equal ENT Nares normal. No drainage or sinus tenderness. Lips, mucosa, and tongue normal. No Thrush Neck: Supple, symmetrical, no adenopathy, thyroid: non tender no carotid bruit and no JVD. Back: No CVA tenderness. Lungs: Clear to auscultation bilaterally. No Wheezing or Rhonchi. No rales. Heart: Regular rate and rhythm, no murmur, rub or gallop. Abdomen: Soft, non-tender,not distended. Bowel  sounds normal. No masses Extremities: atraumatic, no cyanosis. No edema. No clubbing Skin: No rashes or lesions. Or bruising Lymph: Cervical, supraclavicular normal. Neurologic: Grossly non-focal Pertinent Labs Lab Results CBC     Component Value Date/Time   WBC 1.1 (LL) 12/31/2020 0731   RBC 2.25 (L) 12/31/2020 0731   HGB 7.3 (L) 12/31/2020 0731   HGB 7.9 (L) 11/29/2020 0921   HCT 22.0 (L) 12/31/2020 0731   HCT 22.7 (L) 11/29/2020 0921   PLT 33 (L) 12/31/2020 0731   PLT 96 (LL) 11/29/2020 0921   MCV 97.8 12/31/2020 0731   MCV 104 (H) 11/29/2020 0921   MCV 92 09/01/2014 1845   MCH 32.4 12/31/2020 0731   MCHC 33.2 12/31/2020 0731   RDW 21.7 (H) 12/31/2020 0731   RDW 17.9 (H) 11/29/2020 0921   RDW 15.9 (H) 09/01/2014 1845   LYMPHSABS 0.5 (L) 12/31/2020 0731   LYMPHSABS 1.6 10/10/2011 0934   MONOABS 0.3 12/31/2020 0731   MONOABS 0.6 10/10/2011 0934   EOSABS 0.0 12/31/2020 0731   EOSABS 0.2 10/10/2011 0934   BASOSABS 0.0 12/31/2020 0731   BASOSABS 0.0 10/10/2011 0934    CMP Latest Ref Rng & Units 12/31/2020 12/30/2020 12/27/2020  Glucose 70 - 99 mg/dL 141(H) 100(H) 111(H)  BUN 8 - 23 mg/dL 54(H) 46(H) 34(H)  Creatinine 0.61 - 1.24 mg/dL 1.84(H) 1.94(H) 1.74(H)  Sodium 135 - 145 mmol/L 137 137 136  Potassium 3.5 - 5.1 mmol/L 4.8 4.9 4.3  Chloride 98 - 111 mmol/L 106 108 102  CO2 22 - 32 mmol/L 21(L) 23 24  Calcium 8.9 - 10.3 mg/dL 8.1(L) 8.6(L) 8.6(L)  Total Protein 6.5 - 8.1 g/dL 6.0(L) 6.6 -  Total Bilirubin 0.3 - 1.2 mg/dL 1.4(H) 1.4(H) -  Alkaline Phos 38 - 126 U/L 39 46 -  AST 15 - 41 U/L 28 23 -  ALT 0 - 44 U/L 16 14 -      Microbiology: Recent Results (from the past 240 hour(s))  Resp Panel by RT-PCR (Flu A&B, Covid) Nasopharyngeal Swab     Status: None   Collection Time: 12/30/20 12:24 PM   Specimen: Nasopharyngeal Swab; Nasopharyngeal(NP) swabs in vial transport medium  Result Value Ref Range Status   SARS Coronavirus 2 by RT PCR NEGATIVE NEGATIVE Final    Comment: (NOTE) SARS-CoV-2 target nucleic acids are NOT DETECTED.  The SARS-CoV-2 RNA is generally detectable in upper respiratory specimens during the acute phase of infection. The lowest concentration of SARS-CoV-2 viral copies this  assay can detect is 138 copies/mL. A negative result does not preclude SARS-Cov-2 infection and should not be used as the sole basis for treatment or other patient management decisions. A negative result may occur with  improper specimen collection/handling, submission of specimen other than nasopharyngeal swab, presence of viral mutation(s) within the areas targeted by this assay, and inadequate number of viral copies(<138 copies/mL). A negative result must be combined with clinical observations, patient history, and epidemiological information. The expected result is Negative.  Fact Sheet for Patients:  EntrepreneurPulse.com.au  Fact Sheet for Healthcare Providers:  IncredibleEmployment.be  This test is no t yet approved or cleared by the [place] FDA and  has been authorized for detection and/or diagnosis of SARS-CoV-2 by FDA under an Emergency Use Authorization (EUA). This EUA will remain  in effect (meaning this test can be used) for the duration of the COVID-19 declaration under Section 564(b)(1) of the Act, 21 U.S.C.section 360bbb-3(b)(1), unless the authorization is terminated  or revoked sooner.  Influenza A by PCR NEGATIVE NEGATIVE Final   Influenza B by PCR NEGATIVE NEGATIVE Final    Comment: (NOTE) The Xpert Xpress SARS-CoV-2/FLU/RSV plus assay is intended as an aid in the diagnosis of influenza from Nasopharyngeal swab specimens and should not be used as a sole basis for treatment. Nasal washings and aspirates are unacceptable for Xpert Xpress SARS-CoV-2/FLU/RSV testing.  Fact Sheet for Patients: EntrepreneurPulse.com.au  Fact Sheet for Healthcare Providers: IncredibleEmployment.be  This test is not yet approved or cleared by the [place] FDA and has been authorized for detection and/or diagnosis of SARS-CoV-2 by FDA under an Emergency Use Authorization (EUA). This EUA will  remain in effect (meaning this test can be used) for the duration of the COVID-19 declaration under Section 564(b)(1) of the Act, 21 U.S.C. section 360bbb-3(b)(1), unless the authorization is terminated or revoked.  Performed at [facility], [address] [place], [place] [address]   Blood culture (routine x 2)     Status: None (Preliminary result)   Collection Time: 12/30/20  1:59 PM   Specimen: BLOOD  Result Value Ref Range Status   Specimen Description BLOOD BLOOD RIGHT FOREARM  Final   Special Requests   Final    BOTTLES DRAWN AEROBIC AND ANAEROBIC Blood Culture adequate volume   Culture   Final    NO GROWTH < 24 HOURS Performed at [facility], [address] [place], [place] [address]    Report Status PENDING  Incomplete  Blood culture (routine x 2)     Status: None (Preliminary result)   Collection Time: 12/30/20  1:59 PM   Specimen: BLOOD  Result Value Ref Range Status   Specimen Description BLOOD LEFT ANTECUBITAL  Final   Special Requests   Final    BOTTLES DRAWN AEROBIC AND ANAEROBIC Blood Culture adequate volume   Culture   Final    NO GROWTH < 24 HOURS Performed at [facility], [address] [place], [place] [address]    Report Status PENDING  Incomplete    IMAGING RESULTS:  I have personally reviewed the films Marked worsening of diffuse geographic pulmonary infiltrates consistent with worsening atypical pneumonia.  Four-chamber cardiac enlargement with particular enlargement of right atrium.  Impression/Recommendation Severe myelodysplastic syndrome with pancytopenia.  Presenting with acute shortness of breath of few weeks duration worsening in the last week. Bilateral groundglass and interstitial infiltrates.  The differential diagnosis is infection versus noninfectious cause. Doubt this is a regular bacterial pneumonia. Bilateral pneumonia to be ruled out we will do RPP Will also check beta D glucan to exclude  PCP. Also sent for fungal testing  Noninfectious causes like TACO, TRAL!, pulmonary hemorrhage are in the differential diagnosis. Patient is currently on vancomycin cefepime and and azithromycin.,  CAD status post CABG.  Would get 2D echo to assess her EF.  Chronic A. fib.  Rate controlled. ___________________________________________________ Discussed with patient, requesting provider Note:  This document was prepared using Dragon voice recognition software and may include unintentional dictation errors.

## 2020-12-31 NOTE — Plan of Care (Signed)
Pt resting comfortably,alert and oriented x 4. All current needs met at this time

## 2020-12-31 NOTE — ED Notes (Signed)
Patient complaining about wearing BiPap machine. Patient states he doesn't want to wear the BiPap mask but at night. Patient states he would like to wear "a smaller mask-- the one they have on the emergency ambulance" instead of a nasal cannula, because he says he is unable to breath correctly with the prongs in his nose.  This RN made a simple mask from a non-breather to accommodate patient's wishes. Patient tolerating well at this time.

## 2020-12-31 NOTE — Progress Notes (Signed)
Patient reports not being able to tolerate his CPAP machine, and requested for it to be taken off and placed back on nasal cannula.  Removed per patient request and reapplied [place] @ 3L.  Patient reports in the ED there was a CPAP machine "that was better" and patient requested to have that machine.  Advised pt would communicate with RT to see if other CPAP machines are available,

## 2020-12-31 NOTE — ED Notes (Signed)
Patient placed on bedpan.

## 2020-12-31 NOTE — ED Notes (Signed)
Blood transfusion consent signed by patient and placed on chart.

## 2020-12-31 NOTE — Progress Notes (Signed)
Pharmacy Antibiotic Note  Vincent Mason is a 85 y.o. male admitted on 12/30/2020. Pt has medical history for recent diagnosis of myelodysplastic syndrome, pancytopenia and past medical history of COPD, OSA, atrial fibrillation, acute encephalopathy, hypertension, CKD, CAD presented to the ED for treatment and evaluation of shortness of breath. Pharmacy has been consulted for vancomycin dosing for Severe neutropenia in multifocal pna.  Plan:  Adjust vancomycin to 1 g IV q24h --Calculated AUC: 515, Cmin 15 --Daily Scr per protocol --Levels at steady state as clinically indicated  Height: '5\' 10"'$  (177.8 cm) Weight: 88.5 kg (195 lb) IBW/kg (Calculated) : 73  Temp (24hrs), Avg:98.1 F (36.7 C), Min:97.6 F (36.4 C), Max:98.4 F (36.9 C)  Recent Labs  Lab 12/27/20 0922 12/30/20 1200 12/31/20 0731  WBC 2.0* 1.7* 1.1*  CREATININE 1.74* 1.94* 1.84*     Estimated Creatinine Clearance: 34.1 mL/min (A) (by C-G formula based on SCr of 1.84 mg/dL (H)).    No Known Allergies  Antimicrobials this admission: 8/8 Azithromycin >>  8/8 Cefepime >>  8/8 Vancomycin >>   Dose adjustments this admission: N/A  Microbiology results: 8/8 BCx: NGTD 8/8 Sputum: pending 8/8 MRSA PCR: pending  Thank you for allowing pharmacy to be a part of this patient's care.  Benita Gutter 12/31/2020 3:51 PM

## 2020-12-31 NOTE — ED Notes (Signed)
Responded to patient's call light. Patient upset with RN because "there was a beeping noise and I called for you 10 minutes ago, it went on for 10 minutes, and then you come 10 minutes later." Apologized to patient that RN was unable to come to be in his room at the time he requested.  '@0913'$ : Answered patient's call light, again. Patient requesting sugar for his coffee. Provided patient sugar for his coffee.

## 2020-12-31 NOTE — Consult Note (Signed)
[place] NOTE  Patient Care Team: Birdie Sons, MD as PCP - General (Family Medicine) Corey Skains, MD as Consulting Physician (Internal Medicine) Dingeldein, Remo Lipps, MD as Consulting Physician (Ophthalmology) Murlean Iba, MD as Consulting Physician (Internal Medicine) Gillis Santa, MD as Consulting Physician (Pain Medicine)  CHIEF COMPLAINTS/PURPOSE OF CONSULTATION:  High-grade MDS/pancytopenia  HISTORY OF PRESENTING ILLNESS:  Vincent Mason 85 y.o.  male with high-grade MDS/pancytopenia newly diagnosed; history of chronic kidney disease; history of A. fib not on anticoagulation is currently admitted to hospital for worsening shortness of breath.  As per the family patient noted to have shortness of breath and cough over the last 1 to 2 days prior to presentation.  Also noted to have some blood in sputum.  No significant fevers.  No nausea no vomiting.  Poor appetite.  Chronic back pain.  No headaches.  Easy bruising.  On evaluation emergency room patient noted to have lactic acidosis; ANC 400 hemoglobin 7.2 platelets-16.  Given hemoptysis patient received 1 unit of platelets/also 1 unit of blood.  Also started on broad-spectrum antibiotics.  Patient was placed on BiPAP overnight.  This morning patient is currently on 4 L of oxygen.   Review of Systems  Constitutional:  Positive for malaise/fatigue and weight loss. Negative for chills, diaphoresis and fever.  HENT:  Negative for nosebleeds and sore throat.   Eyes:  Negative for double vision.  Respiratory:  Positive for cough, hemoptysis and shortness of breath. Negative for sputum production and wheezing.   Cardiovascular:  Negative for chest pain, palpitations, orthopnea and leg swelling.  Gastrointestinal:  Negative for abdominal pain, blood in stool, constipation, diarrhea, heartburn, melena, nausea and vomiting.  Genitourinary:  Negative for dysuria, frequency and urgency.  Musculoskeletal:   Positive for back pain and joint pain.  Skin: Negative.  Negative for itching and rash.  Neurological:  Negative for dizziness, tingling, focal weakness, weakness and headaches.  Endo/Heme/Allergies:  Bruises/bleeds easily.  Psychiatric/Behavioral:  Negative for depression. The patient is not nervous/anxious and does not have insomnia.     MEDICAL HISTORY:  Past Medical History:  Diagnosis Date   Acute encephalopathy 09/03/2015   Atrial fibrillation and flutter [facility]) 2013   Dr Nehemiah Massed at [facility]  on [address].    C. difficile colitis 09/12/2015   COPD (chronic obstructive pulmonary disease) (HCC)    HCAP (healthcare-associated pneumonia) 09/12/2015   Hyperlipidemia    Hypertension    Kidney disease, chronic, stage III (moderate, EGFR 30-59 ml/min) ([place])    ARF 2013   OSA (obstructive sleep apnea)    non-compliant with CPAP.    Severe sepsis with septic shock ([place]) 09/12/2015    SURGICAL HISTORY: Past Surgical History:  Procedure Laterality Date   CATARACT EXTRACTION     CORONARY ARTERY BYPASS GRAFT  10/1997   EYE SURGERY     history of cervical discectomy  2009   C5-C6   Hyperplastic colon polyp  02/2002   Sigmoid polyps   KYPHOPLASTY N/A 02/06/2020   Procedure: T12 & L3 Kyphoplasty;  Surgeon: Hessie Knows, MD;  Location: [facility] ORS;  Service: Orthopedics;  Laterality: N/A;    SOCIAL HISTORY: Social History   Socioeconomic History   Marital status: Married    Spouse name: Not on file   Number of children: 3   Years of education: some colle   Highest education level: Some college, no degree  Occupational History   Occupation: Retired  Tobacco Use   Smoking status: Former  Smokeless tobacco: Never   Tobacco comments:    Quit in 1990  Vaping Use   Vaping Use: Never used  Substance and Sexual Activity   Alcohol use: No   Drug use: No   Sexual activity: Yes  Other Topics Concern   Not on file  Social History Narrative   Lives in [place] with wife [wife care giver-  [employer]]; no smoking; no alcohol. 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    Social Determinants of Health   Financial Resource Strain: Not on file  Food Insecurity: Not on file  Transportation Needs: Not on file  Physical Activity: Not on file  Stress: Not on file  Social Connections: Not on file  Intimate Partner Violence: Not on file    FAMILY HISTORY: Family History  Problem Relation Age of Onset   Hypertension Mother    Hypertension Father     ALLERGIES:  has No Known Allergies.  MEDICATIONS:  Current Facility-Administered Medications  Medication Dose Route Frequency Provider Last Rate Last Admin   0.9 %  sodium chloride infusion (Manually program via Guardrails IV Fluids)   Intravenous Once Cammie Sickle, MD       acetaminophen (TYLENOL) tablet 650 mg  650 mg Oral Q6H PRN Kayleen Memos, DO       allopurinol (ZYLOPRIM) tablet 200 mg  200 mg Oral Daily Irene Pap N, DO   200 mg at 12/31/20 0929   azithromycin (ZITHROMAX) 500 mg in sodium chloride 0.9 % 250 mL IVPB  500 mg Intravenous Q24H Irene Pap N, DO 250 mL/hr at 12/31/20 1825 500 mg at 12/31/20 1825   ceFEPIme (MAXIPIME) 2 g in sodium chloride 0.9 % 100 mL IVPB  2 g Intravenous Q12H Irene Pap N, DO   Stopped at 12/31/20 1146   cholecalciferol (VITAMIN D) tablet 5,000 Units  5,000 Units Oral Daily Kayleen Memos, DO   5,000 Units at 12/31/20 3664   hydrocortisone (ANUSOL-HC) 2.5 % rectal cream 1 application  1 application Rectal BID PRN Irene Pap N, DO       ipratropium-albuterol (DUONEB) 0.5-2.5 (3) MG/3ML nebulizer solution 3 mL  3 mL Nebulization Q6H Hall, Carole N, DO   3 mL at 12/31/20 2021   ipratropium-albuterol (DUONEB) 0.5-2.5 (3) MG/3ML nebulizer solution 3 mL  3 mL Nebulization Q4H PRN Irene Pap N, DO       lactulose (CHRONULAC) 10 GM/15ML solution 20 g  20 g Oral Once Sharen Hones, MD       melatonin tablet 2.5 mg  2.5 mg Oral QHS PRN Irene Pap N, DO   2.5  mg at 12/30/20 2121   metoprolol tartrate (LOPRESSOR) injection 2.5 mg  2.5 mg Intravenous Q6H PRN Irene Pap N, DO       ondansetron (ZOFRAN) injection 4 mg  4 mg Intravenous Q6H PRN Irene Pap N, DO       oxyCODONE (Oxy IR/ROXICODONE) immediate release tablet 15 mg  15 mg Oral Q4H PRN Sharen Hones, MD       polyethylene glycol (MIRALAX / GLYCOLAX) packet 17 g  17 g Oral Daily Borders, Kirt Boys, NP   17 g at 12/31/20 0935   rosuvastatin (CRESTOR) tablet 10 mg  10 mg Oral Daily Irene Pap N, DO   10 mg at 12/31/20 4034   senna-docusate (Senokot-S) tablet 1 tablet  1 tablet Oral QHS PRN Borders, Kirt Boys, NP       vancomycin (VANCOCIN) IVPB 1000 mg/200  mL premix  1,000 mg Intravenous Q24H Benita Gutter, RPH 200 mL/hr at 12/31/20 2025 1,000 mg at 12/31/20 2025   vitamin B-12 (CYANOCOBALAMIN) tablet 1,000 mcg  1,000 mcg Oral Daily Irene Pap N, DO   1,000 mcg at 12/31/20 8242      .  PHYSICAL EXAMINATION:  Vitals:   12/31/20 2022 12/31/20 2045  BP:  130/74  Pulse:  96  Resp:  20  Temp:  97.7 F (36.5 C)  SpO2: 97% 95%   Filed Weights   12/30/20 1149  Weight: 195 lb (88.5 kg)    Physical Exam Vitals and nursing note reviewed.  Constitutional:      Comments: Patient resting in the bed/. Nasal cannula oxygen: 4 L of oxygen.    HENT:     Head: Normocephalic and atraumatic.     Mouth/Throat:     Mouth: Mucous membranes are moist.     Pharynx: No oropharyngeal exudate.  Eyes:     Pupils: Pupils are equal, round, and reactive to light.  Cardiovascular:     Rate and Rhythm: Normal rate and regular rhythm.  Pulmonary:     Effort: No respiratory distress.     Breath sounds: No wheezing.     Comments: Decreased breath sounds bilaterally at bases.  No wheeze or crackles Abdominal:     General: Bowel sounds are normal. There is no distension.     Palpations: Abdomen is soft. There is no mass.     Tenderness: There is no abdominal tenderness. There is no guarding or  rebound.  Musculoskeletal:        General: No tenderness. Normal range of motion.     Cervical back: Normal range of motion and neck supple.  Skin:    General: Skin is warm.     Comments: Multiple bruises.  Neurological:     Mental Status: He is alert and oriented to person, place, and time.  Psychiatric:        Mood and Affect: Affect normal.        Judgment: Judgment normal.     LABORATORY DATA:  I have reviewed the data as listed Lab Results  Component Value Date   WBC 1.1 (LL) 12/31/2020   HGB 7.3 (L) 12/31/2020   HCT 22.0 (L) 12/31/2020   MCV 97.8 12/31/2020   PLT 33 (L) 12/31/2020   Recent Labs    04/02/20 1455 11/29/20 0921 12/14/20 0911 12/15/20 0617 12/27/20 0922 12/30/20 1200 12/31/20 0731  NA 137   < > 139   < > 136 137 137  K 5.1   < > 4.3   < > 4.3 4.9 4.8  CL 97   < > 106   < > 102 108 106  CO2 24   < > 23   < > 24 23 21*  GLUCOSE 107*   < > 125*   < > 111* 100* 141*  BUN 48*   < > 40*   < > 34* 46* 54*  CREATININE 1.90*   < > 2.19*   < > 1.74* 1.94* 1.84*  CALCIUM 10.3*   < > 9.0   < > 8.6* 8.6* 8.1*  GFRNONAA 32*  --  29*   < > 38* 34* 36*  GFRAA 37*  --   --   --   --   --   --   PROT 7.2   < > 6.7  --   --  6.6 6.0*  ALBUMIN 4.9*   < >  3.7  --   --  3.4* 3.1*  AST 19   < > 17  --   --  23 28  ALT 13   < > 11  --   --  14 16  ALKPHOS 63   < > 45  --   --  46 39  BILITOT 0.4   < > 0.9  --   --  1.4* 1.4*   < > = values in this interval not displayed.    RADIOGRAPHIC STUDIES: I have personally reviewed the radiological images as listed and agreed with the findings in the report. DG Chest 1 View  Result Date: 12/30/2020 CLINICAL DATA:  Shortness of breath, worsening. EXAM: CHEST  1 VIEW COMPARISON:  11/29/2020 and 12/14/2020 FINDINGS: Substantially progressive bilateral interstitial lung patchy airspace opacities distributed throughout both lungs. No compelling prominence of Kerley B lines. Mild enlargement of the cardiopericardial silhouette is  present. No blunting of the costophrenic angles. Prior CABG. Atherosclerotic calcification of the aortic arch. IMPRESSION: 1. Substantially worsened bilateral interstitial and patchy bilateral airspace opacities, with possibilities including progressive multilobar atypical pneumonia or cardiogenic edema. Cardiogenic edema seems less likely given the paucity of Kerley B lines. However, there is mild enlargement of the cardiopericardial silhouette and the patient has had prior CABG. 2.  Aortic Atherosclerosis (ICD10-I70.0). Electronically Signed   By: Van Clines M.D.   On: 12/30/2020 12:34   CT Chest Wo Contrast  Result Date: 12/14/2020 CLINICAL DATA:  Hemoptysis. EXAM: CT CHEST WITHOUT CONTRAST TECHNIQUE: Multidetector CT imaging of the chest was performed following the standard protocol without IV contrast. COMPARISON:  08/30/2011 FINDINGS: Cardiovascular: Heart is enlarged. Status post CABG. Mild atherosclerotic calcification is noted in the wall of the thoracic aorta. Mediastinum/Nodes: Scattered small mediastinal lymph nodes evident. No evidence for gross hilar lymphadenopathy although assessment is limited by the lack of intravenous contrast on today's study. The esophagus has normal imaging features. There is no axillary lymphadenopathy. Lungs/Pleura: Patchy ground-glass opacities identified in both lungs. Ground-glass disease in the lung bases has superimposed areas of more confluent nodular soft tissue density (including 12 mm paraspinal right lower lobe on image 114/2 and 2.3 cm posterior left lower lobe on 01/20 8/2. Diffuse bronchial wall thickening with cylindrical bronchiectasis noted bilaterally. No pleural effusion. No pneumothorax. Upper Abdomen: 5 mm nonobstructing stone noted upper pole right kidney. 2.7 cm left adrenal lipoma or myelolipoma has decreased from 3.5 cm previously. Musculoskeletal: No worrisome lytic or sclerotic osseous abnormality. Bilateral gynecomastia. Evidence of  vertebral augmentation at L1. IMPRESSION: 1. Patchy ground-glass opacities in both lungs with superimposed areas of more confluent nodular soft tissue density at the bases. Imaging features likely related to infectious/inflammatory etiology and atypical infection would be a consideration. Follow-up CT in 3 months recommended to ensure resolution. 2. Diffuse bronchial wall thickening with cylindrical bronchiectasis bilaterally. 3. Interval decrease in size of the left adrenal lipoma or myelolipoma. 4. Nonobstructing right renal stone. 5. Aortic Atherosclerosis (ICD10-I70.0). Electronically Signed   By: Misty Stanley M.D.   On: 12/14/2020 10:52   CT Angio Chest PE W and/or Wo Contrast  Result Date: 12/30/2020 CLINICAL DATA:  Pulmonary emboli suspected. High probability. Worsening shortness of breath. EXAM: CT ANGIOGRAPHY CHEST WITH CONTRAST TECHNIQUE: Multidetector CT imaging of the chest was performed using the standard protocol during bolus administration of intravenous contrast. Multiplanar CT image reconstructions and MIPs were obtained to evaluate the vascular anatomy. CONTRAST:  54m OMNIPAQUE IOHEXOL 350 MG/ML SOLN COMPARISON:  Chest radiography  same day.  CT chest 12/14/2020. FINDINGS: Cardiovascular: Heart is enlarged. There is four-chamber enlargement, but particular enlargement of the right atrium. There is no pericardial fluid. There is been previous median sternotomy and CABG. Pulmonary arterial opacification is good. There are no pulmonary emboli. Mediastinum/Nodes: No mediastinal or hilar mass or lymphadenopathy. Lungs/Pleura: Considerable worsening of bilateral pulmonary infiltrates with a geographic pattern. Findings suggest viral pneumonia, possibly coronavirus. Small amount of pleural fluid on the left. No dense lobar consolidation or collapse. Upper Abdomen: No acute finding. Nonobstructing renal stone on the right. Chronic 2.8 cm fatty mass of the left adrenal gland. Musculoskeletal: Chronic  thoracic degenerative changes. Old T12 compression fracture treated with vertebral augmentation. Review of the MIP images confirms the above findings. IMPRESSION: No pulmonary emboli. Marked worsening of diffuse geographic pulmonary infiltrates consistent with worsening atypical pneumonia, possibly coronavirus. Four-chamber cardiac enlargement, with particular enlargement of the right atrium. Aortic Atherosclerosis (ICD10-I70.0). Electronically Signed   By: Nelson Chimes M.D.   On: 12/30/2020 15:19   US Abdomen Complete  Result Date: 12/14/2020 CLINICAL DATA:  Pancytopenia EXAM: ABDOMEN ULTRASOUND COMPLETE COMPARISON:  November 14, 2015. FINDINGS: Gallbladder: No gallstones or wall thickening visualized. No sonographic Murphy sign noted by sonographer. Common bile duct: Diameter: 3 mm Liver: No focal lesion identified. Mildly heterogeneous liver echotexture. Revisualization of a mildly nodular contour of the liver similar comparison to prior. Portal vein is patent on color Doppler imaging with normal direction of blood flow towards the liver. IVC: No abnormality visualized. Pancreas: Visualized portion unremarkable. Spleen: Size and appearance within normal limits. Right Kidney: Length: 9.9 cm. Echogenicity within normal limits. No mass or hydronephrosis visualized. Left Kidney: Length: 11.9 cm. Echogenicity within normal limits. No mass or hydronephrosis visualized. Abdominal aorta: Limited assessment secondary to shadowing bowel gas of the distal aorta. Within these limitations, no aneurysm visualized proximally. Other findings: None. IMPRESSION: 1. No sonographic evidence of splenomegaly. 2. Mildly nodular contour of the liver with heterogeneous echotexture may reflect underlying cirrhosis. No focal lesion identified. Electronically Signed   By: Valentino Saxon MD   On: 12/14/2020 14:14   CT BONE MARROW BIOPSY & ASPIRATION  Result Date: 12/16/2020 INDICATION: Pancytopenia. EXAM: CT GUIDED BONE MARROW  ASPIRATES AND BIOPSY Physician: Stephan Minister. Anselm Pancoast, MD MEDICATIONS: None. ANESTHESIA/SEDATION: Fentanyl 50 mcg IV; Versed 1.0 mg IV Moderate Sedation Time:  10 minutes The patient was continuously monitored during the procedure by the interventional radiology nurse under my direct supervision. COMPLICATIONS: None immediate. PROCEDURE: The procedure was explained to the patient. The risks and benefits of the procedure were discussed and the patient's questions were addressed. Informed consent was obtained from the patient. The patient was placed prone on CT table. Images of the pelvis were obtained. The right side of back was prepped and draped in sterile fashion. The skin and right posterior ilium were anesthetized with 1% lidocaine. 11 gauge bone needle was directed into the right ilium with CT guidance. Two aspirates and one core biopsy were obtained. Bandage placed over the puncture site. IMPRESSION: CT guided bone marrow aspiration and core biopsy. Electronically Signed   By: Markus Daft M.D.   On: 12/16/2020 11:28    MDS (myelodysplastic syndrome), high grade [facility]) #85 year old male patient with high-grade MDS/severe pancytopenia; CKD stage III/IV-is currently admitted hospital for acute respiratory failure/multifocal pneumonia   # High-grade MDS; July 2022 bone marrow biopsy- Myelodysplastic syndrome with excess blasts [increased blasts -6%-8%;  with marked trilineage dysplasia.];R- IPSS:5.5- HIGH RISK-see below  #Acute respiratory failure/multifocal  pneumonia-in the context of severe neutropenia/immunosuppression -initially BiPAP overnight; currently on 4 L of oxygen saturation 92%. Currently on broad-spectrum antibiotics blood cultures negative.  However atypical infections with considered.   #Anemia/thrombocytopenia/neutropenia-hemoglobin 7.5 on presentation s/p units of PRBC transfusion; platelets 16 on presentation s/p 1 unit of platelet transfusion given hemoptysis.  Severe neutropenia ANC-400; hold  off any growth factor.   # FULL CODE:  #Plan:  #Discussed with the patient at length given the significant setback in the diagnosis of pneumonia/and the fact that this will delay his onset of planned therapy/also increase risk of infections moving forward.  Discussed that his pneumonia should improve/resolve prior to considering any therapy.  #Long discussion with patient regarding the guarded prognosis and that we will monitor his clinical status closely-however patient also understands that it is quite possible his pneumonia might not improve given his underlying immunocompromise status.  Would also recommend ID evaluation.  #Also recommend evaluation with Josh Borders with regards to patient's CODE STATUS.  Strongly recommend DNR/DNI.  We will have a family meeting tomorrow at noon to discuss goals of care.  Discussed with [employer].  Thank you Dr.Zhang for allowing me to participate in the care of your pleasant patient. Please do not hesitate to contact me with questions or concerns in the interim.   All questions were answered. The patient knows to call the clinic with any problems, questions or concerns.    Cammie Sickle, MD 12/31/2020 8:52 PM

## 2020-12-31 NOTE — ED Notes (Signed)
Notified MD and clarified if patient is to receive 2nd unit of PRBC's per order. Dr. Roosevelt Locks stated awaiting AM labs to result.

## 2020-12-31 NOTE — ED Notes (Signed)
Lab to obtain pts 0500 labs.

## 2020-12-31 NOTE — ED Notes (Signed)
Patient had BM. Hard stool observed.

## 2020-12-31 NOTE — Progress Notes (Signed)
PROGRESS NOTE    Vincent Mason  [id number] DOB: 1936/02/21 DOA: 12/30/2020 PCP: Birdie Sons, MD   Chief complaint.  Shortness of breath. Brief Narrative:  Vincent Mason is a 85 y.o. 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 fib not on oral anticoagulation, CKD 3B, chronic back pain, recent admission 7/23-7/26 for hemoptysis, who presented to [facility] ED due to worsening shortness of breath. Work-up showed multifocal pneumonia on CT chest, white cell count 1.7, platelet count 16, hemoglobin 7.2.   Assessment & Plan:   Active Problems:   Multifocal pneumonia  #1.  Neutropenic sepsis secondary to multifocal pneumonia. Acute hypoxemic respiratory failure secondary to multifocal pneumonia Multifocal pneumonia. Patient has multifocal pneumonia on chest x-ray and CT scan.  Her neutrophil count is on the 400 with high-grade MDS.  Patient is immunocompromised. Blood culture sent out, pending results. He is covered spectrum antibiotics. ID consult will be obtained. Patient was on BiPAP last night for acute respiratory failure, condition is better today.  He is placed on nasal cannula.  2.  Newly diagnosed high-grade MDS. Pancytopenia secondary to MDS. Patient is a followed by hematology/oncology.  3.  Elevated troponin secondary to sepsis. Continue to follow.  4.  Chronic atrial fibrillation. Chronic systolic congestive heart failure Coronary disease status post CABG and PCI. Not chronically on anticoagulation.  Patient currently does not have any volume overload.  #5.  Chronic kidney disease stage IIIb. Renal function stable.  DVT prophylaxis: SCDs Code Status: full Family Communication:  Disposition Plan:    Status is: Inpatient  Remains inpatient appropriate because:IV treatments appropriate due to intensity of illness or inability to take PO and Inpatient level of care appropriate due to severity  of illness  Dispo:  Patient From: Home  Planned Disposition: Home  Medically stable for discharge: No          I/O last 3 completed shifts: In: 1498.7 [I.V.:50.5; Blood:675; IV Piggyback:773.2] Out: 950 [Urine:950] No intake/output data recorded.     Consultants:  Oncology, ID  Procedures: None  Antimicrobials: Cefepime and vancomycin  Subjective: Patient doing better today, he is off BiPAP, currently on nasal cannula. He has a cough, nonproductive. No fever or chills. No dysuria hematuria. No abdominal pain, is complaining of severe constipation.  Lactulose will be given.  Objective: Vitals:   12/31/20 0230 12/31/20 0530 12/31/20 0600 12/31/20 0919  BP: 98/60 94/64 105/68 123/65  Pulse: 84 81 81 70  Resp: '20 16 16 18  '$ Temp:    98.2 F (36.8 C)  TempSrc:    Oral  SpO2: 100% 98% 97% 96%  Weight:      Height:        Intake/Output Summary (Last 24 hours) at 12/31/2020 1205 Last data filed at 12/30/2020 2210 Gross per 24 hour  Intake 1498.72 ml  Output 950 ml  Net 548.72 ml   Filed Weights   12/30/20 1149  Weight: 88.5 kg    Examination:  General exam: Appears calm and comfortable  Respiratory system: Clear to auscultation. Respiratory effort normal. Cardiovascular system: Irregular.  No JVD, murmurs, rubs, gallops or clicks. No pedal edema. Gastrointestinal system: Abdomen is nondistended, soft and nontender. No organomegaly or masses felt. Normal bowel sounds heard. Central nervous system: Alert and oriented x2. No focal neurological deficits. Extremities: Symmetric  Skin: No rashes, lesions or ulcers Psychiatry: Judgement and insight appear normal. Mood & affect appropriate.     Data  Reviewed: I have personally reviewed following labs and imaging studies  CBC: Recent Labs  Lab 12/27/20 0922 12/30/20 1200 12/30/20 2014 12/31/20 0731  WBC 2.0* 1.7*  --  1.1*  NEUTROABS 0.5* 0.4*  --  0.4*  HGB 7.1* 7.2* 8.0* 7.3*  HCT 21.7* 21.9* 24.2*  22.0*  MCV 103.8* 99.5  --  97.8  PLT 18* 16*  --  33*   Basic Metabolic Panel: Recent Labs  Lab 12/27/20 0922 12/30/20 1200 12/31/20 0731  NA 136 137 137  K 4.3 4.9 4.8  CL 102 108 106  CO2 24 23 21*  GLUCOSE 111* 100* 141*  BUN 34* 46* 54*  CREATININE 1.74* 1.94* 1.84*  CALCIUM 8.6* 8.6* 8.1*  MG  --   --  2.4  PHOS  --   --  4.2   GFR: Estimated Creatinine Clearance: 34.1 mL/min (A) (by C-G formula based on SCr of 1.84 mg/dL (H)). Liver Function Tests: Recent Labs  Lab 12/30/20 1200 12/31/20 0731  AST 23 28  ALT 14 16  ALKPHOS 46 39  BILITOT 1.4* 1.4*  PROT 6.6 6.0*  ALBUMIN 3.4* 3.1*   No results for input(s): LIPASE, AMYLASE in the last 168 hours. No results for input(s): AMMONIA in the last 168 hours. Coagulation Profile: No results for input(s): INR, PROTIME in the last 168 hours. Cardiac Enzymes: No results for input(s): CKTOTAL, CKMB, CKMBINDEX, TROPONINI in the last 168 hours. BNP (last 3 results) No results for input(s): PROBNP in the last 8760 hours. HbA1C: No results for input(s): HGBA1C in the last 72 hours. CBG: No results for input(s): GLUCAP in the last 168 hours. Lipid Profile: No results for input(s): CHOL, HDL, LDLCALC, TRIG, CHOLHDL, LDLDIRECT in the last 72 hours. Thyroid Function Tests: No results for input(s): TSH, T4TOTAL, FREET4, T3FREE, THYROIDAB in the last 72 hours. Anemia Panel: No results for input(s): VITAMINB12, FOLATE, FERRITIN, TIBC, IRON, RETICCTPCT in the last 72 hours. Sepsis Labs: No results for input(s): PROCALCITON, LATICACIDVEN in the last 168 hours.  Recent Results (from the past 240 hour(s))  Resp Panel by RT-PCR (Flu A&B, Covid) Nasopharyngeal Swab     Status: None   Collection Time: 12/30/20 12:24 PM   Specimen: Nasopharyngeal Swab; Nasopharyngeal(NP) swabs in vial transport medium  Result Value Ref Range Status   SARS Coronavirus 2 by RT PCR NEGATIVE NEGATIVE Final    Comment: (NOTE) SARS-CoV-2 target  nucleic acids are NOT DETECTED.  The SARS-CoV-2 RNA is generally detectable in upper respiratory specimens during the acute phase of infection. The lowest concentration of SARS-CoV-2 viral copies this assay can detect is 138 copies/mL. A negative result does not preclude SARS-Cov-2 infection and should not be used as the sole basis for treatment or other patient management decisions. A negative result may occur with  improper specimen collection/handling, submission of specimen other than nasopharyngeal swab, presence of viral mutation(s) within the areas targeted by this assay, and inadequate number of viral copies(<138 copies/mL). A negative result must be combined with clinical observations, patient history, and epidemiological information. The expected result is Negative.  Fact Sheet for Patients:  EntrepreneurPulse.com.au  Fact Sheet for Healthcare Providers:  IncredibleEmployment.be  This test is no t yet approved or cleared by the [place] FDA and  has been authorized for detection and/or diagnosis of SARS-CoV-2 by FDA under an Emergency Use Authorization (EUA). This EUA will remain  in effect (meaning this test can be used) for the duration of the COVID-19 declaration under Section 564(b)(1)  of the Act, 21 U.S.C.section 360bbb-3(b)(1), unless the authorization is terminated  or revoked sooner.       Influenza A by PCR NEGATIVE NEGATIVE Final   Influenza B by PCR NEGATIVE NEGATIVE Final    Comment: (NOTE) The Xpert Xpress SARS-CoV-2/FLU/RSV plus assay is intended as an aid in the diagnosis of influenza from Nasopharyngeal swab specimens and should not be used as a sole basis for treatment. Nasal washings and aspirates are unacceptable for Xpert Xpress SARS-CoV-2/FLU/RSV testing.  Fact Sheet for Patients: EntrepreneurPulse.com.au  Fact Sheet for Healthcare  Providers: IncredibleEmployment.be  This test is not yet approved or cleared by the [place] FDA and has been authorized for detection and/or diagnosis of SARS-CoV-2 by FDA under an Emergency Use Authorization (EUA). This EUA will remain in effect (meaning this test can be used) for the duration of the COVID-19 declaration under Section 564(b)(1) of the Act, 21 U.S.C. section 360bbb-3(b)(1), unless the authorization is terminated or revoked.  Performed at [facility], [address] [place], [place] [address]   Blood culture (routine x 2)     Status: None (Preliminary result)   Collection Time: 12/30/20  1:59 PM   Specimen: BLOOD  Result Value Ref Range Status   Specimen Description BLOOD BLOOD RIGHT FOREARM  Final   Special Requests   Final    BOTTLES DRAWN AEROBIC AND ANAEROBIC Blood Culture adequate volume   Culture   Final    NO GROWTH < 24 HOURS Performed at [facility], [address], [place], [place] [address]    Report Status PENDING  Incomplete  Blood culture (routine x 2)     Status: None (Preliminary result)   Collection Time: 12/30/20  1:59 PM   Specimen: BLOOD  Result Value Ref Range Status   Specimen Description BLOOD LEFT ANTECUBITAL  Final   Special Requests   Final    BOTTLES DRAWN AEROBIC AND ANAEROBIC Blood Culture adequate volume   Culture   Final    NO GROWTH < 24 HOURS Performed at [facility], [address], [place],  [address]    Report Status PENDING  Incomplete         Radiology Studies: DG Chest 1 View  Result Date: 12/30/2020 CLINICAL DATA:  Shortness of breath, worsening. EXAM: CHEST  1 VIEW COMPARISON:  11/29/2020 and 12/14/2020 FINDINGS: Substantially progressive bilateral interstitial lung patchy airspace opacities distributed throughout both lungs. No compelling prominence of Kerley B lines. Mild enlargement of the cardiopericardial silhouette is present. No blunting of the  costophrenic angles. Prior CABG. Atherosclerotic calcification of the aortic arch. IMPRESSION: 1. Substantially worsened bilateral interstitial and patchy bilateral airspace opacities, with possibilities including progressive multilobar atypical pneumonia or cardiogenic edema. Cardiogenic edema seems less likely given the paucity of Kerley B lines. However, there is mild enlargement of the cardiopericardial silhouette and the patient has had prior CABG. 2.  Aortic Atherosclerosis (ICD10-I70.0). Electronically Signed   By: Van Clines M.D.   On: 12/30/2020 12:34   CT Angio Chest PE W and/or Wo Contrast  Result Date: 12/30/2020 CLINICAL DATA:  Pulmonary emboli suspected. High probability. Worsening shortness of breath. EXAM: CT ANGIOGRAPHY CHEST WITH CONTRAST TECHNIQUE: Multidetector CT imaging of the chest was performed using the standard protocol during bolus administration of intravenous contrast. Multiplanar CT image reconstructions and MIPs were obtained to evaluate the vascular anatomy. CONTRAST:  35m OMNIPAQUE IOHEXOL 350 MG/ML SOLN COMPARISON:  Chest radiography same day.  CT chest 12/14/2020. FINDINGS: Cardiovascular: Heart  is enlarged. There is four-chamber enlargement, but particular enlargement of the right atrium. There is no pericardial fluid. There is been previous median sternotomy and CABG. Pulmonary arterial opacification is good. There are no pulmonary emboli. Mediastinum/Nodes: No mediastinal or hilar mass or lymphadenopathy. Lungs/Pleura: Considerable worsening of bilateral pulmonary infiltrates with a geographic pattern. Findings suggest viral pneumonia, possibly coronavirus. Small amount of pleural fluid on the left. No dense lobar consolidation or collapse. Upper Abdomen: No acute finding. Nonobstructing renal stone on the right. Chronic 2.8 cm fatty mass of the left adrenal gland. Musculoskeletal: Chronic thoracic degenerative changes. Old T12 compression fracture treated with  vertebral augmentation. Review of the MIP images confirms the above findings. IMPRESSION: No pulmonary emboli. Marked worsening of diffuse geographic pulmonary infiltrates consistent with worsening atypical pneumonia, possibly coronavirus. Four-chamber cardiac enlargement, with particular enlargement of the right atrium. Aortic Atherosclerosis (ICD10-I70.0). Electronically Signed   By: Nelson Chimes M.D.   On: 12/30/2020 15:19        Scheduled Meds:  allopurinol  200 mg Oral Daily   cholecalciferol  5,000 Units Oral Daily   furosemide  20 mg Intravenous STAT   ipratropium-albuterol  3 mL Nebulization Q6H   polyethylene glycol  17 g Oral Daily   rosuvastatin  10 mg Oral Daily   vitamin B-12  1,000 mcg Oral Daily   Continuous Infusions:  azithromycin Stopped (12/30/20 1919)   ceFEPime (MAXIPIME) IV Stopped (12/31/20 1146)   [START ON 01/01/2021] vancomycin       LOS: 1 day    Time spent: 32 minutes    Sharen Hones, MD [facility]   To contact the attending provider between 7A-7P or the covering provider during after hours 7P-7A, please log into the web site [email or web address] and access using universal [place] password for that web site. If you do not have the password, please call the hospital operator.  12/31/2020, 12:05 PM

## 2020-12-31 NOTE — Progress Notes (Signed)
[facility]  [phone] [phone]   Name: Vincent Mason Date: 12/31/2020 MRN: [id number]  DOB: [birth year]  Patient Care Team: Birdie Sons, MD as PCP - General (Family Medicine) Corey Skains, MD as Consulting Physician (Internal Medicine) Dingeldein, Remo Lipps, MD as Consulting Physician (Ophthalmology) Murlean Iba, MD as Consulting Physician (Internal Medicine) Gillis Santa, MD as Consulting Physician (Pain Medicine)    REASON FOR CONSULTATION: Vincent Mason is a 85 y.o. male with multiple medical problems including CAD status post CABG, chronic A. fib not on anticoagulation, CKD stage IIIb, and newly diagnosed high-grade transfusion dependent MDS with plan to start on treatment with azacitidine.  Patient was recently hospitalized 12/14/2020 to 12/17/2020 for hemoptysis.  Patient was readmitted to the hospital 12/30/2020 with multifocal pneumonia and found to have severe pancytopenia.  Palliative care was consulted help address goals.  CODE STATUS: Full code  PAST MEDICAL HISTORY: Past Medical History:  Diagnosis Date   Acute encephalopathy 09/03/2015   Atrial fibrillation and flutter ([place]) 2013   Dr Nehemiah Massed at [facility]  on [place].    C. difficile colitis 09/12/2015   COPD (chronic obstructive pulmonary disease) (HCC)    HCAP (healthcare-associated pneumonia) 09/12/2015   Hyperlipidemia    Hypertension    Kidney disease, chronic, stage III (moderate, EGFR 30-59 ml/min) ([place])    ARF 2013   OSA (obstructive sleep apnea)    non-compliant with CPAP.    Severe sepsis with septic shock ([place]) 09/12/2015    PAST SURGICAL HISTORY:  Past Surgical History:  Procedure Laterality Date   CATARACT EXTRACTION     CORONARY ARTERY BYPASS GRAFT  10/1997   EYE SURGERY     history of cervical discectomy  2009   C5-C6   Hyperplastic colon polyp  02/2002   Sigmoid polyps   KYPHOPLASTY N/A 02/06/2020   Procedure: T12 & L3  Kyphoplasty;  Surgeon: Hessie Knows, MD;  Location: [facility] ORS;  Service: Orthopedics;  Laterality: N/A;    HEMATOLOGY/ONCOLOGY HISTORY:  Oncology History Overview Note  #High-grade MDS; July 2026 - Myelodysplastic syndrome with excess blasts [increased blasts (6%-8%;  with marked trilineage dysplasia.]; FISH panel- DEL 20 [GOOD]R- IPSS:5.5- HIGH RISK.     # AUG 15th, 2022- [facility]   # NGS/MOLECULAR TESTS:    # PALLIATIVE CARE EVALUATION:  # PAIN MANAGEMENT:    DIAGNOSIS:   STAGE:         ;  GOALS:  CURRENT/MOST RECENT THERAPY :     MDS (myelodysplastic syndrome), high grade ([place])  12/20/2020 Initial Diagnosis   MDS (myelodysplastic syndrome), high grade ([place])   01/06/2021 -  Chemotherapy    Patient is on Treatment Plan: MYELODYSPLASIA  AZACITIDINE IV D1-5 Q28D         ALLERGIES:  has No Known Allergies.  MEDICATIONS:  Current Facility-Administered Medications  Medication Dose Route Frequency Provider Last Rate Last Admin   0.9 %  sodium chloride infusion (Manually program via Guardrails IV Fluids)   Intravenous Once Cammie Sickle, MD       acetaminophen (TYLENOL) tablet 650 mg  650 mg Oral Q6H PRN Irene Pap N, DO       allopurinol (ZYLOPRIM) tablet 200 mg  200 mg Oral Daily Caroline, Carole N, DO   200 mg at 12/31/20 0929   azithromycin (ZITHROMAX) 500 mg in sodium chloride 0.9 % 250 mL IVPB  500 mg Intravenous Q24H Irene Pap N, DO   Stopped at 12/30/20  1919   ceFEPIme (MAXIPIME) 2 g in sodium chloride 0.9 % 100 mL IVPB  2 g Intravenous Q12H Irene Pap N, DO   Stopped at 12/31/20 1146   cholecalciferol (VITAMIN D) tablet 5,000 Units  5,000 Units Oral Daily Kayleen Memos, DO   5,000 Units at 12/31/20 9381   furosemide (LASIX) injection 20 mg  20 mg Intravenous STAT Hall, Carole N, DO       hydrocortisone (ANUSOL-HC) 2.5 % rectal cream 1 application  1 application Rectal BID PRN Irene Pap N, DO       ipratropium-albuterol (DUONEB) 0.5-2.5 (3) MG/3ML  nebulizer solution 3 mL  3 mL Nebulization Q6H Hall, Carole N, DO   3 mL at 12/31/20 1413   ipratropium-albuterol (DUONEB) 0.5-2.5 (3) MG/3ML nebulizer solution 3 mL  3 mL Nebulization Q4H PRN Hall, Carole N, DO       lactulose (CHRONULAC) 10 GM/15ML solution 20 g  20 g Oral Once Sharen Hones, MD       melatonin tablet 2.5 mg  2.5 mg Oral QHS PRN Irene Pap N, DO   2.5 mg at 12/30/20 2121   metoprolol tartrate (LOPRESSOR) injection 2.5 mg  2.5 mg Intravenous Q6H PRN Irene Pap N, DO       ondansetron (ZOFRAN) injection 4 mg  4 mg Intravenous Q6H PRN Hall, Carole N, DO       polyethylene glycol (MIRALAX / GLYCOLAX) packet 17 g  17 g Oral Daily Rafael Quesada, Kirt Boys, NP   17 g at 12/31/20 0935   rosuvastatin (CRESTOR) tablet 10 mg  10 mg Oral Daily Irene Pap N, DO   10 mg at 12/31/20 0175   senna-docusate (Senokot-S) tablet 1 tablet  1 tablet Oral QHS PRN Maple Odaniel, Kirt Boys, NP       [START ON 01/01/2021] vancomycin (VANCOREADY) IVPB 1750 mg/350 mL  1,750 mg Intravenous Q48H Rauer, Samantha O, RPH       vitamin B-12 (CYANOCOBALAMIN) tablet 1,000 mcg  1,000 mcg Oral Daily Irene Pap N, DO   1,000 mcg at 12/31/20 1025   Current Outpatient Medications  Medication Sig Dispense Refill   allopurinol (ZYLOPRIM) 100 MG tablet Take 2 tablets (200 mg total) by mouth daily. 180 tablet 4   Cholecalciferol (VITAMIN D-3) 125 MCG (5000 UT) TABS Take 5,000 Units by mouth daily.     gabapentin (NEURONTIN) 300 MG capsule Take 1 capsule (300 mg total) by mouth at bedtime.     metoprolol succinate (TOPROL-XL) 25 MG 24 hr tablet TAKE 1/2 A TABLET BY MOUTH DAILY (Patient taking differently: Take 12.5 mg by mouth daily.) 30 tablet 12   oxyCODONE (ROXICODONE) 15 MG immediate release tablet TAKE 1 TABLET BY MOUTH EVERY 4 HOURS AS NEEDED (Patient taking differently: Take 15 mg by mouth every 4 (four) hours as needed for pain.) 168 tablet 0   polyethylene glycol (MIRALAX / GLYCOLAX) packet Take 17 g by mouth daily.  (Patient not taking: Reported on 12/27/2020) 14 each 0   PROCTOZONE-HC 2.5 % rectal cream PLACE 1 APPLICATION RECTALLY TWICE DAILYAS DIRECTED (Patient taking differently: Place 1 application rectally 2 (two) times daily as needed (discomfort/hemorrhoids).) 30 g 0   rosuvastatin (CRESTOR) 10 MG tablet TAKE 1 TABLET BY MOUTH ONCE DAILY 90 tablet 4   spironolactone (ALDACTONE) 25 MG tablet Take 25 mg by mouth daily.     torsemide (DEMADEX) 20 MG tablet Take 0.5 tablets (10 mg total) by mouth daily. Take one daily as needed for excessive swelling  vitamin B-12 (CYANOCOBALAMIN) 1000 MCG tablet Take 1 tablet (1,000 mcg total) by mouth daily. 30 tablet 1    VITAL SIGNS: BP (!) 121/95   Pulse (!) 104   Temp 98.4 F (36.9 C) (Oral)   Resp (!) 21   Ht 5' 10"  (1.778 m)   Wt 195 lb (88.5 kg)   SpO2 96%   BMI 27.98 kg/m  Filed Weights   12/30/20 1149  Weight: 195 lb (88.5 kg)    Estimated body mass index is 27.98 kg/m as calculated from the following:   Height as of this encounter: 5' 10"  (1.778 m).   Weight as of this encounter: 195 lb (88.5 kg).  LABS: CBC:    Component Value Date/Time   WBC 1.1 (LL) 12/31/2020 0731   HGB 7.3 (L) 12/31/2020 0731   HGB 7.9 (L) 11/29/2020 0921   HCT 22.0 (L) 12/31/2020 0731   HCT 22.7 (L) 11/29/2020 0921   PLT 33 (L) 12/31/2020 0731   PLT 96 (LL) 11/29/2020 0921   MCV 97.8 12/31/2020 0731   MCV 104 (H) 11/29/2020 0921   MCV 92 09/01/2014 1845   NEUTROABS 0.4 (LL) 12/31/2020 0731   NEUTROABS 3.4 10/10/2011 0934   LYMPHSABS 0.5 (L) 12/31/2020 0731   LYMPHSABS 1.6 10/10/2011 0934   MONOABS 0.3 12/31/2020 0731   MONOABS 0.6 10/10/2011 0934   EOSABS 0.0 12/31/2020 0731   EOSABS 0.2 10/10/2011 0934   BASOSABS 0.0 12/31/2020 0731   BASOSABS 0.0 10/10/2011 0934   Comprehensive Metabolic Panel:    Component Value Date/Time   NA 137 12/31/2020 0731   NA 141 11/29/2020 0921   NA 136 06/30/2014 0657   K 4.8 12/31/2020 0731   K 3.6 06/30/2014 0657    CL 106 12/31/2020 0731   CL 102 06/30/2014 0657   CO2 21 (L) 12/31/2020 0731   CO2 27 06/30/2014 0657   BUN 54 (H) 12/31/2020 0731   BUN 35 (H) 11/29/2020 0921   BUN 41 (H) 06/30/2014 0657   CREATININE 1.84 (H) 12/31/2020 0731   CREATININE 2.42 (H) 04/26/2017 1123   GLUCOSE 141 (H) 12/31/2020 0731   GLUCOSE 131 (H) 06/30/2014 0657   CALCIUM 8.1 (L) 12/31/2020 0731   CALCIUM 8.6 06/30/2014 0657   AST 28 12/31/2020 0731   AST 28 06/30/2014 0657   ALT 16 12/31/2020 0731   ALT 26 06/30/2014 0657   ALKPHOS 39 12/31/2020 0731   ALKPHOS 61 06/30/2014 0657   BILITOT 1.4 (H) 12/31/2020 0731   BILITOT 0.7 11/29/2020 0921   BILITOT 0.6 06/30/2014 0657   PROT 6.0 (L) 12/31/2020 0731   PROT 6.8 11/29/2020 0921   PROT 7.7 06/30/2014 0657   ALBUMIN 3.1 (L) 12/31/2020 0731   ALBUMIN 4.6 11/29/2020 0921   ALBUMIN 3.7 06/30/2014 0657    RADIOGRAPHIC STUDIES: DG Chest 1 View  Result Date: 12/30/2020 CLINICAL DATA:  Shortness of breath, worsening. EXAM: CHEST  1 VIEW COMPARISON:  11/29/2020 and 12/14/2020 FINDINGS: Substantially progressive bilateral interstitial lung patchy airspace opacities distributed throughout both lungs. No compelling prominence of Kerley B lines. Mild enlargement of the cardiopericardial silhouette is present. No blunting of the costophrenic angles. Prior CABG. Atherosclerotic calcification of the aortic arch. IMPRESSION: 1. Substantially worsened bilateral interstitial and patchy bilateral airspace opacities, with possibilities including progressive multilobar atypical pneumonia or cardiogenic edema. Cardiogenic edema seems less likely given the paucity of Kerley B lines. However, there is mild enlargement of the cardiopericardial silhouette and the patient has had prior CABG. 2.  Aortic Atherosclerosis (ICD10-I70.0). Electronically Signed   By: Van Clines M.D.   On: 12/30/2020 12:34   CT Chest Wo Contrast  Result Date: 12/14/2020 CLINICAL DATA:  Hemoptysis. EXAM:  CT CHEST WITHOUT CONTRAST TECHNIQUE: Multidetector CT imaging of the chest was performed following the standard protocol without IV contrast. COMPARISON:  08/30/2011 FINDINGS: Cardiovascular: Heart is enlarged. Status post CABG. Mild atherosclerotic calcification is noted in the wall of the thoracic aorta. Mediastinum/Nodes: Scattered small mediastinal lymph nodes evident. No evidence for gross hilar lymphadenopathy although assessment is limited by the lack of intravenous contrast on today's study. The esophagus has normal imaging features. There is no axillary lymphadenopathy. Lungs/Pleura: Patchy ground-glass opacities identified in both lungs. Ground-glass disease in the lung bases has superimposed areas of more confluent nodular soft tissue density (including 12 mm paraspinal right lower lobe on image 114/2 and 2.3 cm posterior left lower lobe on 01/20 8/2. Diffuse bronchial wall thickening with cylindrical bronchiectasis noted bilaterally. No pleural effusion. No pneumothorax. Upper Abdomen: 5 mm nonobstructing stone noted upper pole right kidney. 2.7 cm left adrenal lipoma or myelolipoma has decreased from 3.5 cm previously. Musculoskeletal: No worrisome lytic or sclerotic osseous abnormality. Bilateral gynecomastia. Evidence of vertebral augmentation at L1. IMPRESSION: 1. Patchy ground-glass opacities in both lungs with superimposed areas of more confluent nodular soft tissue density at the bases. Imaging features likely related to infectious/inflammatory etiology and atypical infection would be a consideration. Follow-up CT in 3 months recommended to ensure resolution. 2. Diffuse bronchial wall thickening with cylindrical bronchiectasis bilaterally. 3. Interval decrease in size of the left adrenal lipoma or myelolipoma. 4. Nonobstructing right renal stone. 5. Aortic Atherosclerosis (ICD10-I70.0). Electronically Signed   By: Misty Stanley M.D.   On: 12/14/2020 10:52   CT Angio Chest PE W and/or Wo  Contrast  Result Date: 12/30/2020 CLINICAL DATA:  Pulmonary emboli suspected. High probability. Worsening shortness of breath. EXAM: CT ANGIOGRAPHY CHEST WITH CONTRAST TECHNIQUE: Multidetector CT imaging of the chest was performed using the standard protocol during bolus administration of intravenous contrast. Multiplanar CT image reconstructions and MIPs were obtained to evaluate the vascular anatomy. CONTRAST:  80m OMNIPAQUE IOHEXOL 350 MG/ML SOLN COMPARISON:  Chest radiography same day.  CT chest 12/14/2020. FINDINGS: Cardiovascular: Heart is enlarged. There is four-chamber enlargement, but particular enlargement of the right atrium. There is no pericardial fluid. There is been previous median sternotomy and CABG. Pulmonary arterial opacification is good. There are no pulmonary emboli. Mediastinum/Nodes: No mediastinal or hilar mass or lymphadenopathy. Lungs/Pleura: Considerable worsening of bilateral pulmonary infiltrates with a geographic pattern. Findings suggest viral pneumonia, possibly coronavirus. Small amount of pleural fluid on the left. No dense lobar consolidation or collapse. Upper Abdomen: No acute finding. Nonobstructing renal stone on the right. Chronic 2.8 cm fatty mass of the left adrenal gland. Musculoskeletal: Chronic thoracic degenerative changes. Old T12 compression fracture treated with vertebral augmentation. Review of the MIP images confirms the above findings. IMPRESSION: No pulmonary emboli. Marked worsening of diffuse geographic pulmonary infiltrates consistent with worsening atypical pneumonia, possibly coronavirus. Four-chamber cardiac enlargement, with particular enlargement of the right atrium. Aortic Atherosclerosis (ICD10-I70.0). Electronically Signed   By: MNelson ChimesM.D.   On: 12/30/2020 15:19   UKoreaAbdomen Complete  Result Date: 12/14/2020 CLINICAL DATA:  Pancytopenia EXAM: ABDOMEN ULTRASOUND COMPLETE COMPARISON:  November 14, 2015. FINDINGS: Gallbladder: No gallstones or  wall thickening visualized. No sonographic Murphy sign noted by sonographer. Common bile duct: Diameter: 3 mm Liver: No focal lesion identified. Mildly heterogeneous liver  echotexture. Revisualization of a mildly nodular contour of the liver similar comparison to prior. Portal vein is patent on color Doppler imaging with normal direction of blood flow towards the liver. IVC: No abnormality visualized. Pancreas: Visualized portion unremarkable. Spleen: Size and appearance within normal limits. Right Kidney: Length: 9.9 cm. Echogenicity within normal limits. No mass or hydronephrosis visualized. Left Kidney: Length: 11.9 cm. Echogenicity within normal limits. No mass or hydronephrosis visualized. Abdominal aorta: Limited assessment secondary to shadowing bowel gas of the distal aorta. Within these limitations, no aneurysm visualized proximally. Other findings: None. IMPRESSION: 1. No sonographic evidence of splenomegaly. 2. Mildly nodular contour of the liver with heterogeneous echotexture may reflect underlying cirrhosis. No focal lesion identified. Electronically Signed   By: Valentino Saxon MD   On: 12/14/2020 14:14   CT BONE MARROW BIOPSY & ASPIRATION  Result Date: 12/16/2020 INDICATION: Pancytopenia. EXAM: CT GUIDED BONE MARROW ASPIRATES AND BIOPSY Physician: Stephan Minister. Anselm Pancoast, MD MEDICATIONS: None. ANESTHESIA/SEDATION: Fentanyl 50 mcg IV; Versed 1.0 mg IV Moderate Sedation Time:  10 minutes The patient was continuously monitored during the procedure by the interventional radiology nurse under my direct supervision. COMPLICATIONS: None immediate. PROCEDURE: The procedure was explained to the patient. The risks and benefits of the procedure were discussed and the patient's questions were addressed. Informed consent was obtained from the patient. The patient was placed prone on CT table. Images of the pelvis were obtained. The right side of back was prepped and draped in sterile fashion. The skin and right  posterior ilium were anesthetized with 1% lidocaine. 11 gauge bone needle was directed into the right ilium with CT guidance. Two aspirates and one core biopsy were obtained. Bandage placed over the puncture site. IMPRESSION: CT guided bone marrow aspiration and core biopsy. Electronically Signed   By: Markus Daft M.D.   On: 12/16/2020 11:28    PERFORMANCE STATUS (ECOG) : 2 - Symptomatic, <50% confined to bed  Review of Systems Unless otherwise noted, a complete review of systems is negative.  Physical Exam General: NAD Pulmonary: unlabored Extremities: no edema, no joint deformities Skin: no rashes Neurological: Weakness but otherwise nonfocal  IMPRESSION: Patient remains in ER.  He required BiPAP overnight but is now on regular O2.  Patient feels like his breathing may be slightly improved today.  He reports resolution of his constipation after having a large bowel movement.  He denies other symptomatic complaints.  I received a message from family requesting DNR/DNI.  However, in communication with patient, he has quite clearly requested to remain a full code.  I again addressed this with patient today and he requested to continue every treatment and life-prolonging measure available including resuscitation and intubation.  I called and spoke with patient's son, [place].  Family meeting arranged for tomorrow.  Hopefully, we can clarify goals with patient/family.  PLAN: -Continue current prescription treatment -Family meeting planned for tomorrow  Case and plan discussed with Dr. Rogue Bussing   Time Total: 25 minutes  Visit consisted of counseling and education dealing with the complex and emotionally intense issues of symptom management and palliative care in the setting of serious and potentially life-threatening illness.Greater than 50%  of this time was spent counseling and coordinating care related to the above assessment and plan.  Signed by: Altha Harm, PhD, NP-C

## 2020-12-31 NOTE — ED Notes (Signed)
Pt called out stating that "no one has been in here all night", when this RN has been doing hourly rounds and assessing the patient whom has been sleeping. He removed the BiPAP machine to consume water, as well as removed some of his monitoring equipment and complained that there was too much noise, which was a result of him removing his leads. This RN attempted to educate the patient on the importance of not consuming water with the BiPAP on and he stated "I don't give shit". The patient was provided a warm blanket and remote control for the television.

## 2020-12-31 NOTE — ED Notes (Signed)
Assisted patient with lunch tray. Provided patient pilliow

## 2020-12-31 NOTE — ED Notes (Signed)
Lab collecting Type and Screen

## 2020-12-31 NOTE — ED Notes (Signed)
Pt removed himself from the bedpan. RT called to place the patient back on the BiPAP.

## 2020-12-31 NOTE — Assessment & Plan Note (Addendum)
#  85 year old male patient with high-grade MDS/severe pancytopenia; CKD stage III/IV-is currently admitted hospital for acute respiratory failure/multifocal pneumonia  # High-grade MDS; July 2022 bone marrow biopsy- Myelodysplastic syndrome with excess blasts [increased blasts -6%-8%;  with marked trilineage dysplasia.];R- IPSS:5.5- HIGH RISK-see below.   #Acute respiratory failure/multifocal pneumonia-in the context of severe neutropenia/immunosuppression- currently on 3 L of oxygen saturation 92-94%. Currently on broad-spectrum antibiotics blood cultures negative.  Question atypical infection- fungal vs Viral vs TRALI [clinically less likely transfusion reaction].  Defer to ID/pulmonary for further management.   # Anemia/thrombocytopenia/neutropenia-hemoglobin 8.3 on presentation s/p 2 units of PRBC transfusion; platelets 32 s/p 3 unit of platelet transfusion given hemoptysis.  Severe neutropenia ANC-500; hold off any growth factor.  Transfuse if hemoglobin 7-8; platelets less than 10,000 or clinical evidence bleeding noted.  # Rectal bleeding: likely secondary to hemorrhoids-s/p GI evaluation.  #CODE STATUS full code.   #Prognosis is still poor.  I called patient's son to update; unable to reach/leave voicemail-mailbox full.

## 2020-12-31 NOTE — ED Notes (Signed)
Charge RN aware of the patients behavior and addressed some of his concerns and provided him water.

## 2021-01-01 ENCOUNTER — Encounter: Payer: Self-pay | Admitting: Internal Medicine

## 2021-01-01 ENCOUNTER — Inpatient Hospital Stay: Payer: Medicare Other

## 2021-01-01 DIAGNOSIS — Z515 Encounter for palliative care: Secondary | ICD-10-CM | POA: Diagnosis not present

## 2021-01-01 DIAGNOSIS — R918 Other nonspecific abnormal finding of lung field: Secondary | ICD-10-CM

## 2021-01-01 DIAGNOSIS — J9601 Acute respiratory failure with hypoxia: Secondary | ICD-10-CM | POA: Diagnosis not present

## 2021-01-01 LAB — TYPE AND SCREEN
ABO/RH(D): B POS
Antibody Screen: NEGATIVE
Unit division: 0
Unit division: 0
Unit division: 0

## 2021-01-01 LAB — BASIC METABOLIC PANEL
Anion gap: 7 (ref 5–15)
BUN: 50 mg/dL — ABNORMAL HIGH (ref 8–23)
CO2: 23 mmol/L (ref 22–32)
Calcium: 8.4 mg/dL — ABNORMAL LOW (ref 8.9–10.3)
Chloride: 106 mmol/L (ref 98–111)
Creatinine, Ser: 1.63 mg/dL — ABNORMAL HIGH (ref 0.61–1.24)
GFR, Estimated: 42 mL/min — ABNORMAL LOW (ref >60)
Glucose, Bld: 110 mg/dL — ABNORMAL HIGH (ref 70–99)
Potassium: 4.6 mmol/L (ref 3.5–5.1)
Sodium: 136 mmol/L (ref 135–145)

## 2021-01-01 LAB — BPAM RBC
Blood Product Expiration Date: 202208122359
Blood Product Expiration Date: 202208172359
Blood Product Expiration Date: 202208192359
ISSUE DATE / TIME: 202208081525
ISSUE DATE / TIME: 202208091516
Unit Type and Rh: 5100
Unit Type and Rh: 9500
Unit Type and Rh: 9500

## 2021-01-01 LAB — CBC WITH DIFFERENTIAL/PLATELET
Abs Immature Granulocytes: 0.05 10*3/uL (ref 0.00–0.07)
Basophils Absolute: 0 10*3/uL (ref 0.0–0.1)
Basophils Relative: 1 %
Eosinophils Absolute: 0 10*3/uL (ref 0.0–0.5)
Eosinophils Relative: 0 %
HCT: 24.2 % — ABNORMAL LOW (ref 39.0–52.0)
Hemoglobin: 8.4 g/dL — ABNORMAL LOW (ref 13.0–17.0)
Immature Granulocytes: 2 %
Lymphocytes Relative: 33 %
Lymphs Abs: 0.7 10*3/uL (ref 0.7–4.0)
MCH: 32.9 pg (ref 26.0–34.0)
MCHC: 34.7 g/dL (ref 30.0–36.0)
MCV: 94.9 fL (ref 80.0–100.0)
Monocytes Absolute: 0.7 10*3/uL (ref 0.1–1.0)
Monocytes Relative: 34 %
Neutro Abs: 0.6 10*3/uL — ABNORMAL LOW (ref 1.7–7.7)
Neutrophils Relative %: 30 %
Platelets: 28 10*3/uL — CL (ref 150–400)
RBC: 2.55 MIL/uL — ABNORMAL LOW (ref 4.22–5.81)
RDW: 20.7 % — ABNORMAL HIGH (ref 11.5–15.5)
WBC: 2.1 10*3/uL — ABNORMAL LOW (ref 4.0–10.5)
nRBC: 1.4 % — ABNORMAL HIGH (ref 0.0–0.2)

## 2021-01-01 LAB — MRSA NEXT GEN BY PCR, NASAL: MRSA by PCR Next Gen: NOT DETECTED

## 2021-01-01 LAB — MAGNESIUM: Magnesium: 2.6 mg/dL — ABNORMAL HIGH (ref 1.7–2.4)

## 2021-01-01 LAB — PROCALCITONIN: Procalcitonin: 0.56 ng/mL

## 2021-01-01 LAB — PHOSPHORUS: Phosphorus: 3 mg/dL (ref 2.5–4.6)

## 2021-01-01 NOTE — Progress Notes (Signed)
Called to patient's room secondary to complaints about unable to sleep. Patient explained to me that he could not rest with dreamstation bipap. He stated he was placed on a different machine in the emergency room. I placed patient on a V60 with a different mask with hopes of duplicating what he had on in emergency department. Patient continues to complain that machine doesn't feel the same. Patient wanted me to increased fio2 and I explained to patient that he did not need an increase in fio2. Placed patient on the max fio2 allowed on this unit through the V60 for comfort purposes only. No indication for high fio2. Explained to patient to please call RN if he decided he did not want to wear V60 machine.

## 2021-01-01 NOTE — Progress Notes (Signed)
Patient called for RN, upon entering, patient sitting without BIPAP in place.  Patient expressed concern over his pain medication.  Pt states "my oxycodone is one '15mg'$  pill, and the three '5mg'$  oxycodone given here do not work like mine at home"    Patient states "probably 4 of yours would work better".  Explained to the patient that this RN can only give what is prescribed by the provider and that patient could discuss concerns with pain mgmt with the provider.  Patient wanted to know if his son could bring his home oxycodone and he could take that.  This RN explained again that the provider would have to approve that, and patient would not be able to keep it in his room if provider did approve.  Patient states "what I should have done was just had my son bring my medication and keep it here so I can take it when I want"  Again this RN explained that home medications cannot stay with the patient while in the hospital, and patient stated "How would anyone even know!  I don't want anyone calling my doctor and there are too many doctors involved as it is!"    Patient requested PRN oxycodone currently prescribed as he states his legs hurt.  Administered as per ordered and will continue to monitor.   Helped patient re-apply BIPAP.  Patient concerned with how he will switch back to nasal cannula.  Explained to patient he could call when ready to go back to nasal cannula

## 2021-01-01 NOTE — Progress Notes (Signed)
Per RT, patient states he did not want "neb treatment", and wanted to be placed back on BIPAP machine at 6L.  RT stated she did so.  Patient continues to tell this RN that he wants machine that he had in the ED because he does not feel oxygen coming through nasal cannula and doesn't prefer the BIPAP.  Educated patient again on BIPAP machine and purpose and that patient may not feel oxygen going through nasal cannula, however reassured pt that he is getting adequate oxygen.  Encouraged patient again to remain on BIPAP while asleep.  Pt reports he will try.

## 2021-01-01 NOTE — Consult Note (Signed)
PCCM CONSULT NOTE   NAME: Vincent Mason  DOB: June 28, 1935  MRN: [id number]  Date/Time: 01/01/2021 3:31 PM     REASON FOR CONSULT:  Multifocal pneumonia in a patient with neutropenia Abnormal CT chest   85 yo WM male with a history of CAD status post CABG, CKD, A. fib, recently diagnosed MDS + anemia   presented from home with shortness of breath.   Patient was recently hospitalized between 12/14/2020 until 12/18/2020 for hemoptysis.  During that hospitalization he had abnormal lab work with a platelet count of 40,000, hemoglobin 6 and white count of 1.9.    CT scanning of the chest without contrast showed patchy groundglass opacities in both lungs with superimposed areas of more confluent nodular soft tissue density at the bases.  Also seen was diffuse bronchial wall thickening with cylindrical bronchiectasis bilaterally.    He underwent IR guided bone marrow biopsy on 12/16/2020.  Pathological diagnosis was high-grade MDS.    He has received blood transfusion.  He will follow up with oncologist as outpatient.  The plan was to start him on azacitidine.     He continued to have shortness of breath and also was getting a little bit of bloody sputum.  On 12/27/2020 he received platelet transfusion.  Patient came to the ED on 12/30/2020 with worsening shortness of breath.    In the ED BP 102/58 Temperature 98.4, heart rate 86 and respiratory 22.  Sats 99% on 2 L oxygen. Labs revealed a WBC of 1.7, hemoglobin 8 and platelets 16.  Creatinine was 1.94.    PCCM was consulted to assess for [facility]  Past Medical History:  Diagnosis Date   Acute encephalopathy 09/03/2015   Atrial fibrillation and flutter [facility]) 2013   Dr Nehemiah Massed at [facility]  on [place].    C. difficile colitis 09/12/2015   COPD (chronic obstructive pulmonary disease) (HCC)    HCAP (healthcare-associated pneumonia) 09/12/2015   Hyperlipidemia    Hypertension    Kidney disease, chronic, stage III (moderate, EGFR 30-59 ml/min) ([place])     ARF 2013   OSA (obstructive sleep apnea)    non-compliant with CPAP.    Severe sepsis with septic shock ([place]) 09/12/2015    Past Surgical History:  Procedure Laterality Date   CATARACT EXTRACTION     CORONARY ARTERY BYPASS GRAFT  10/1997   EYE SURGERY     history of cervical discectomy  2009   C5-C6   Hyperplastic colon polyp  02/2002   Sigmoid polyps   KYPHOPLASTY N/A 02/06/2020   Procedure: T12 & L3 Kyphoplasty;  Surgeon: Hessie Knows, MD;  Location: [facility] ORS;  Service: Orthopedics;  Laterality: N/A;    Social History   Socioeconomic History   Marital status: Married    Spouse name: Not on file   Number of children: 3   Years of education: some colle   Highest education level: Some college, no degree  Occupational History   Occupation: Retired  Tobacco Use   Smoking status: Former   Smokeless tobacco: Never   Tobacco comments:    Quit in [place] Use   Vaping Use: Never used  Substance and Sexual Activity   Alcohol use: No   Drug use: No   Sexual activity: Yes  Other Topics Concern   Not on file  Social History Narrative   Lives in [place] with wife [wife care giver- [employer]]; no smoking; no alcohol. 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  Social Determinants of Health   Financial Resource Strain: Not on file  Food Insecurity: Not on file  Transportation Needs: Not on file  Physical Activity: Not on file  Stress: Not on file  Social Connections: Not on file  Intimate Partner Violence: Not on file    Family History  Problem Relation Age of Onset   Hypertension Mother    Hypertension Father    No Known Allergies I? Current Facility-Administered Medications  Medication Dose Route Frequency Provider Last Rate Last Admin   0.9 %  sodium chloride infusion (Manually program via Guardrails IV Fluids)   Intravenous Once Cammie Sickle, MD       acetaminophen (TYLENOL) tablet 650 mg  650 mg Oral Q6H PRN Kayleen Memos, DO       allopurinol (ZYLOPRIM) tablet 200 mg  200 mg Oral Daily Irene Pap N, DO   200 mg at 01/01/21 1045   azithromycin (ZITHROMAX) 500 mg in sodium chloride 0.9 % 250 mL IVPB  500 mg Intravenous Q24H Irene Pap N, DO   Stopped at 12/31/20 1920   ceFEPIme (MAXIPIME) 2 g in sodium chloride 0.9 % 100 mL IVPB  2 g Intravenous Q12H Irene Pap N, DO 200 mL/hr at 01/01/21 1045 2 g at 01/01/21 1045   cholecalciferol (VITAMIN D) tablet 5,000 Units  5,000 Units Oral Daily Irene Pap N, DO   5,000 Units at 01/01/21 1045   hydrocortisone (ANUSOL-HC) 2.5 % rectal cream 1 application  1 application Rectal BID PRN Irene Pap N, DO       ipratropium-albuterol (DUONEB) 0.5-2.5 (3) MG/3ML nebulizer solution 3 mL  3 mL Nebulization Q6H Hall, Carole N, DO   3 mL at 01/01/21 1332   ipratropium-albuterol (DUONEB) 0.5-2.5 (3) MG/3ML nebulizer solution 3 mL  3 mL Nebulization Q4H PRN Hall, Carole N, DO       lactulose (CHRONULAC) 10 GM/15ML solution 20 g  20 g Oral Once Sharen Hones, MD       melatonin tablet 2.5 mg  2.5 mg Oral QHS PRN Irene Pap N, DO   2.5 mg at 12/30/20 2121   metoprolol tartrate (LOPRESSOR) injection 2.5 mg  2.5 mg Intravenous Q6H PRN Irene Pap N, DO       ondansetron (ZOFRAN) injection 4 mg  4 mg Intravenous Q6H PRN Irene Pap N, DO       oxyCODONE (Oxy IR/ROXICODONE) immediate release tablet 15 mg  15 mg Oral Q4H PRN Sharen Hones, MD   15 mg at 01/01/21 6195   polyethylene glycol (MIRALAX / GLYCOLAX) packet 17 g  17 g Oral Daily Borders, Kirt Boys, NP   17 g at 01/01/21 1044   rosuvastatin (CRESTOR) tablet 10 mg  10 mg Oral Daily Irene Pap N, DO   10 mg at 01/01/21 1045   senna-docusate (Senokot-S) tablet 1 tablet  1 tablet Oral QHS PRN Borders, Kirt Boys, NP       vancomycin (VANCOCIN) IVPB 1000 mg/200 mL premix  1,000 mg Intravenous Q24H Benita Gutter, RPH   Stopped at 12/31/20 2150   vitamin B-12 (CYANOCOBALAMIN) tablet 1,000 mcg  1,000 mcg Oral Daily Irene Pap N, DO   1,000 mcg at 01/01/21 1045     Abtx:  Anti-infectives (From admission, onward)    Start     Dose/Rate Route Frequency Ordered Stop   01/01/21 1700  vancomycin (VANCOREADY) IVPB 1750 mg/350 mL  Status:  Discontinued        1,750 mg  175 mL/hr over 120 Minutes Intravenous Every 48 hours 12/30/20 1621 12/31/20 1549   12/31/20 2000  vancomycin (VANCOCIN) IVPB 1000 mg/200 mL premix        1,000 mg 200 mL/hr over 60 Minutes Intravenous Every 24 hours 12/31/20 1550     12/31/20 1000  ceFEPIme (MAXIPIME) 2 g in sodium chloride 0.9 % 100 mL IVPB  Status:  Discontinued        2 g 200 mL/hr over 30 Minutes Intravenous Every 12 hours 12/30/20 1612 12/30/20 1614   12/30/20 1700  vancomycin (VANCOREADY) IVPB 2000 mg/400 mL        2,000 mg 200 mL/hr over 120 Minutes Intravenous  Once 12/30/20 1621 12/30/20 2146   12/30/20 1613  ceFEPIme (MAXIPIME) 2 g in sodium chloride 0.9 % 100 mL IVPB        2 g 200 mL/hr over 30 Minutes Intravenous Every 12 hours 12/30/20 1614     12/30/20 1600  azithromycin (ZITHROMAX) 500 mg in sodium chloride 0.9 % 250 mL IVPB        500 mg 250 mL/hr over 60 Minutes Intravenous Every 24 hours 12/30/20 1556 01/03/21 1759   12/30/20 1600  ceFEPIme (MAXIPIME) 2 g in sodium chloride 0.9 % 100 mL IVPB  Status:  Discontinued        2 g 200 mL/hr over 30 Minutes Intravenous Every 8 hours 12/30/20 1556 12/30/20 1612   12/30/20 1545  cefTRIAXone (ROCEPHIN) 1 g in sodium chloride 0.9 % 100 mL IVPB  Status:  Discontinued        1 g 200 mL/hr over 30 Minutes Intravenous  Once 12/30/20 1534 12/30/20 1556   12/30/20 1545  azithromycin (ZITHROMAX) 500 mg in sodium chloride 0.9 % 250 mL IVPB  Status:  Discontinued        500 mg 250 mL/hr over 60 Minutes Intravenous  Once 12/30/20 1534 12/30/20 1556      Review of Systems:  Gen:  Denies  fever, sweats, chills weight loss  HEENT: Denies blurred vision, double vision, ear pain, eye pain, hearing loss, nose bleeds, sore  throat Cardiac:  No dizziness, chest pain or heaviness, chest tightness,edema, No JVD Resp:   No cough, -sputum production, +shortness of breath,-wheezing, -hemoptysis,  Gi: Denies swallowing difficulty, stomach pain, nausea or vomiting, diarrhea, constipation, bowel incontinence Other:  All other systems negative  ?  Objective:  VITALS:  BP 130/67 (BP Location: Right Arm)   Pulse (!) 114   Temp 97.9 F (36.6 C) (Oral)   Resp 18   Ht 5' 10"  (1.778 m)   Wt 88.5 kg   SpO2 98%   BMI 27.98 kg/m   Physical Examination:   General Appearance: No distress  EYES PERRLA, EOM intact.   NECK Supple, No JVD Pulmonary: normal breath sounds, No wheezing.  CardiovascularNormal S1,S2.  No m/r/g.   Abdomen: Benign, Soft, non-tender. Skin:   warm, no rashes, no ecchymosis  Extremities: normal, no cyanosis, clubbing. Neuro:without focal findings,  speech normal  PSYCHIATRIC: Mood, affect within normal limits.   ALL OTHER ROS ARE NEGATIVE    Pertinent Labs Lab Results CBC    Component Value Date/Time   WBC 2.1 (L) 01/01/2021 0517   RBC 2.55 (L) 01/01/2021 0517   HGB 8.4 (L) 01/01/2021 0517   HGB 7.9 (L) 11/29/2020 0921   HCT 24.2 (L) 01/01/2021 0517   HCT 22.7 (L) 11/29/2020 0921   PLT 28 (LL) 01/01/2021 0517   PLT 96 (LL) 11/29/2020 7622  MCV 94.9 01/01/2021 0517   MCV 104 (H) 11/29/2020 0921   MCV 92 09/01/2014 1845   MCH 32.9 01/01/2021 0517   MCHC 34.7 01/01/2021 0517   RDW 20.7 (H) 01/01/2021 0517   RDW 17.9 (H) 11/29/2020 0921   RDW 15.9 (H) 09/01/2014 1845   LYMPHSABS 0.7 01/01/2021 0517   LYMPHSABS 1.6 10/10/2011 0934   MONOABS 0.7 01/01/2021 0517   MONOABS 0.6 10/10/2011 0934   EOSABS 0.0 01/01/2021 0517   EOSABS 0.2 10/10/2011 0934   BASOSABS 0.0 01/01/2021 0517   BASOSABS 0.0 10/10/2011 0934    CMP Latest Ref Rng & Units 01/01/2021 12/31/2020 12/30/2020  Glucose 70 - 99 mg/dL 110(H) 141(H) 100(H)  BUN 8 - 23 mg/dL 50(H) 54(H) 46(H)  Creatinine 0.61 - 1.24  mg/dL 1.63(H) 1.84(H) 1.94(H)  Sodium 135 - 145 mmol/L 136 137 137  Potassium 3.5 - 5.1 mmol/L 4.6 4.8 4.9  Chloride 98 - 111 mmol/L 106 106 108  CO2 22 - 32 mmol/L 23 21(L) 23  Calcium 8.9 - 10.3 mg/dL 8.4(L) 8.1(L) 8.6(L)  Total Protein 6.5 - 8.1 g/dL - 6.0(L) 6.6  Total Bilirubin 0.3 - 1.2 mg/dL - 1.4(H) 1.4(H)  Alkaline Phos 38 - 126 U/L - 39 46  AST 15 - 41 U/L - 28 23  ALT 0 - 44 U/L - 16 14      Microbiology: Recent Results (from the past 240 hour(s))  Resp Panel by RT-PCR (Flu A&B, Covid) Nasopharyngeal Swab     Status: None   Collection Time: 12/30/20 12:24 PM   Specimen: Nasopharyngeal Swab; Nasopharyngeal(NP) swabs in vial transport medium  Result Value Ref Range Status   SARS Coronavirus 2 by RT PCR NEGATIVE NEGATIVE Final    Comment: (NOTE) SARS-CoV-2 target nucleic acids are NOT DETECTED.  The SARS-CoV-2 RNA is generally detectable in upper respiratory specimens during the acute phase of infection. The lowest concentration of SARS-CoV-2 viral copies this assay can detect is 138 copies/mL. A negative result does not preclude SARS-Cov-2 infection and should not be used as the sole basis for treatment or other patient management decisions. A negative result may occur with  improper specimen collection/handling, submission of specimen other than nasopharyngeal swab, presence of viral mutation(s) within the areas targeted by this assay, and inadequate number of viral copies(<138 copies/mL). A negative result must be combined with clinical observations, patient history, and epidemiological information. The expected result is Negative.  Fact Sheet for Patients:  EntrepreneurPulse.com.au  Fact Sheet for Healthcare Providers:  IncredibleEmployment.be  This test is no t yet approved or cleared by the [place] FDA and  has been authorized for detection and/or diagnosis of SARS-CoV-2 by FDA under an Emergency Use Authorization  (EUA). This EUA will remain  in effect (meaning this test can be used) for the duration of the COVID-19 declaration under Section 564(b)(1) of the Act, 21 U.S.C.section 360bbb-3(b)(1), unless the authorization is terminated  or revoked sooner.       Influenza A by PCR NEGATIVE NEGATIVE Final   Influenza B by PCR NEGATIVE NEGATIVE Final    Comment: (NOTE) The Xpert Xpress SARS-CoV-2/FLU/RSV plus assay is intended as an aid in the diagnosis of influenza from Nasopharyngeal swab specimens and should not be used as a sole basis for treatment. Nasal washings and aspirates are unacceptable for Xpert Xpress SARS-CoV-2/FLU/RSV testing.  Fact Sheet for Patients: EntrepreneurPulse.com.au  Fact Sheet for Healthcare Providers: IncredibleEmployment.be  This test is not yet approved or cleared by the [place] FDA  and has been authorized for detection and/or diagnosis of SARS-CoV-2 by FDA under an Emergency Use Authorization (EUA). This EUA will remain in effect (meaning this test can be used) for the duration of the COVID-19 declaration under Section 564(b)(1) of the Act, 21 U.S.C. section 360bbb-3(b)(1), unless the authorization is terminated or revoked.  Performed at [facility], [address] [place], [place] [address]   Blood culture (routine x 2)     Status: None (Preliminary result)   Collection Time: 12/30/20  1:59 PM   Specimen: BLOOD  Result Value Ref Range Status   Specimen Description BLOOD BLOOD RIGHT FOREARM  Final   Special Requests   Final    BOTTLES DRAWN AEROBIC AND ANAEROBIC Blood Culture adequate volume   Culture   Final    NO GROWTH 2 DAYS Performed at [facility], [address], [place], [place] [address]    Report Status PENDING  Incomplete  Blood culture (routine x 2)     Status: None (Preliminary result)   Collection Time: 12/30/20  1:59 PM   Specimen: BLOOD  Result Value Ref Range Status    Specimen Description BLOOD LEFT ANTECUBITAL  Final   Special Requests   Final    BOTTLES DRAWN AEROBIC AND ANAEROBIC Blood Culture adequate volume   Culture   Final    NO GROWTH 2 DAYS Performed at [facility], [address], [place], [place] [address]    Report Status PENDING  Incomplete    CT CHEST   The CT chest was Independently Reviewed By Me Today   ASSESSMENT AND PLAN  85 yo white male with newly diagnosed  Severe myelodysplastic syndrome with pancytopenia with progressive b/l infiltrates s/p blood transfusions, seems to be getting better on minimal oxygen.   B/L infiltrates with GGO  Possible infections-atypical pneumonia, viral, bacterial, ?PCP Possible inflammation-pneumonitis, TRALI, lymphangitic spread of MDS Possible PULM edema from sCHF  Continue IV abx per ID Follow up cultures Consider steroids if worsening symptoms Consider lasix therapy and check ECHO  At this time, patient is high risk for bronchoscopy especially with PLT 28.  Overall prognosis is very poor, recommend DNR/DNI status Palliative care team consulted    Jamoni Broadfoot Patricia Pesa, M.D.  [facility] Pulmonary & Critical Care Medicine  Medical Director [place] Director [place] Department

## 2021-01-01 NOTE — TOC Initial Note (Signed)
Transition of Care [facility]) - Initial/Assessment Note    Patient Details  Name: Vincent Mason MRN: [id number] Date of Birth: October 23, 1935  Transition of Care [facility]) CM/SW Contact:    Alberteen Sam, LCSW Phone Number: 01/01/2021, 10:00 AM  Clinical Narrative:                  CSW spoke with patient to complete readmission risk assess. CSW notes that patient declined home health last admission. Reports he currently does not need home health services, states him and his wife who has alzheimer's lives with his son who cares for them. Reports his PCP is Dr. Caryn Section, and he receives his medication from [facility]. Patient reports no transportation issues getting to and from appointments, and no current O2 at home. Anticipates he will need O2 at discharge, CSW will follow for discharge planning.   No other needs identified at this time.   Expected Discharge Plan: Home/Self Care Barriers to Discharge: Continued Medical Work up   Patient Goals and CMS Choice Patient states their goals for this hospitalization and ongoing recovery are:: to go home CMS Medicare.gov Compare Post Acute Care list provided to:: Patient Choice offered to / list presented to : Patient  Expected Discharge Plan and Services Expected Discharge Plan: Home/Self Care       Living arrangements for the past 2 months: Single Family Home                                      Prior Living Arrangements/Services Living arrangements for the past 2 months: Single Family Home Lives with:: Self, Spouse, Adult Children                   Activities of Daily Living Home Assistive Devices/Equipment: None ADL Screening (condition at time of admission) Patient's cognitive ability adequate to safely complete daily activities?: No Is the patient deaf or have difficulty hearing?: No Does the patient have difficulty seeing, even when wearing glasses/contacts?: No Does the patient have difficulty concentrating,  remembering, or making decisions?: No Patient able to express need for assistance with ADLs?: No Does the patient have difficulty dressing or bathing?: No Independently performs ADLs?: Yes (appropriate for developmental age) Communication: Independent Dressing (OT): Independent Grooming: Independent Feeding: Independent Bathing: Independent Toileting: Independent In/Out Bed: Independent Walks in Home: Independent Does the patient have difficulty walking or climbing stairs?: No Weakness of Legs: None Weakness of Arms/Hands: None  Permission Sought/Granted                  Emotional Assessment   Attitude/Demeanor/Rapport: Gracious Affect (typically observed): Calm Orientation: : Oriented to Self, Oriented to Place, Oriented to  Time, Oriented to Situation Alcohol / Substance Use: Not Applicable Psych Involvement: No (comment)  Admission diagnosis:  MDS (myelodysplastic syndrome), high grade (HCC) [D46.Z] Acute respiratory failure with hypoxia (HCC) [J96.01] Neutropenia associated with infection (HCC) [D70.3] Pancytopenia ([place]) [D61.818] Symptomatic anemia [D64.9] Acute hypoxemic respiratory failure (HCC) [J96.01] Multifocal pneumonia [J18.9] Patient Active Problem List   Diagnosis Date Noted   Neutropenic sepsis ([place]) 12/31/2020   Acute hypoxemic respiratory failure ([place]) 12/31/2020   Multifocal pneumonia 12/30/2020   Palliative care encounter    MDS (myelodysplastic syndrome), high grade ([place]) 12/20/2020   Vitamin B12 deficiency 12/15/2020   Cough with hemoptysis 12/14/2020   Acute on chronic systolic CHF (congestive heart failure) ([place]) 12/14/2020   Pancytopenia ([place]) 12/14/2020  Lumbar spondylosis 01/02/2020   Compression fracture of third lumbar vertebra (HCC) 01/02/2020   Discogenic thoracic pain 01/02/2020   Chronic pain syndrome 01/02/2020   Restless leg syndrome 06/22/2018   Erectile dysfunction due to arterial insufficiency 05/04/2018   Biliary  cirrhosis ([place]) 02/21/2018   Seizure ([place]) 08/06/2017   Vitamin D deficiency 04/08/2017   Neuropathy 04/08/2017   Altered mental status 01/21/2017   Blood in stool    Anemia due to chronic blood loss    Hypomagnesemia 09/14/2015   Chronic atrial fibrillation ([place]) 09/12/2015   Essential hypertension 09/12/2015   Fecal occult blood test positive 09/12/2015   Nodular hyperplasia of liver    Normocytic anemia 09/03/2015   Opioid type dependence, continuous ([place]) 09/03/2015   Hypokalemia 08/26/2015   Hyperuricemia 07/09/2015   Coronary artery disease 01/22/2015   Chronic kidney disease (CKD), stage III (moderate) (HCC) 01/22/2015   Chronic constipation 01/22/2015   COPD (chronic obstructive pulmonary disease) ([place]) 01/22/2015   ED (erectile dysfunction) of organic origin 01/22/2015   Lymphedema of both lower extremities 01/22/2015   Fatigue 01/22/2015   Blood in the urine 01/22/2015   Calculus of kidney 01/22/2015   Memory loss 01/22/2015   Hypertensive pulmonary vascular disease ([place]) 01/22/2015   Displacement of cervical intervertebral disc without myelopathy 01/22/2015   Seizure disorder ([place]) 01/17/2015   Obstructive sleep apnea 01/17/2015   Hyperlipidemia 01/17/2015   Leg swelling 01/17/2015   Lumbar degenerative disc disease 01/17/2015   Back pain 11/12/2014   TI (tricuspid incompetence) 08/02/2014   PCP:  Birdie Sons, MD Pharmacy:   [facility] [place] - [address] [place] [address] Phone: [phone] Fax: [phone]     Social Determinants of Health (SDOH) Interventions    Readmission Risk Interventions No flowsheet data found.

## 2021-01-01 NOTE — Progress Notes (Signed)
[name] Date: 01/01/2021 MRN: [id number]  DOB: 25-Jun-1935  Patient Care Team: Birdie Sons, MD as PCP - General (Family Medicine) Corey Skains, MD as Consulting Physician (Internal Medicine) Dingeldein, Remo Lipps, MD as Consulting Physician (Ophthalmology) Murlean Iba, MD as Consulting Physician (Internal Medicine) Gillis Santa, MD as Consulting Physician (Pain Medicine)    REASON FOR CONSULTATION: Vincent Mason is a 85 y.o. male with multiple medical problems including CAD status post CABG, chronic A. fib not on anticoagulation, CKD stage IIIb, and newly diagnosed high-grade transfusion dependent MDS with plan to start on treatment with azacitidine.  Patient was recently hospitalized 12/14/2020 to 12/17/2020 for hemoptysis.  Patient was readmitted to the hospital 12/30/2020 with multifocal pneumonia and found to have severe pancytopenia.  Palliative care was consulted help address goals.  CODE STATUS: Full code  PAST MEDICAL HISTORY: Past Medical History:  Diagnosis Date   Acute encephalopathy 09/03/2015   Atrial fibrillation and flutter ([place]) 2013   Dr Nehemiah Massed at [facility]  on [place].    C. difficile colitis 09/12/2015   COPD (chronic obstructive pulmonary disease) (HCC)    HCAP (healthcare-associated pneumonia) 09/12/2015   Hyperlipidemia    Hypertension    Kidney disease, chronic, stage III (moderate, EGFR 30-59 ml/min) ([place])    ARF 2013   OSA (obstructive sleep apnea)    non-compliant with CPAP.    Severe sepsis with septic shock ([place]) 09/12/2015    PAST SURGICAL HISTORY:  Past Surgical History:  Procedure Laterality Date   CATARACT EXTRACTION     CORONARY ARTERY BYPASS GRAFT  10/1997   EYE SURGERY     history of cervical discectomy  2009   C5-C6   Hyperplastic colon polyp  02/2002   Sigmoid polyps   KYPHOPLASTY N/A 02/06/2020   Procedure: T12 & L3  Kyphoplasty;  Surgeon: Hessie Knows, MD;  Location: [facility] ORS;  Service: Orthopedics;  Laterality: N/A;    HEMATOLOGY/ONCOLOGY HISTORY:  Oncology History Overview Note  #High-grade MDS; July 2026 - Myelodysplastic syndrome with excess blasts [increased blasts (6%-8%;  with marked trilineage dysplasia.]; FISH panel- DEL 20 [GOOD]R- IPSS:5.5- HIGH RISK.     # AUG 15th, 2022- [facility]   # NGS/MOLECULAR TESTS:    # PALLIATIVE CARE EVALUATION:  # PAIN MANAGEMENT:    DIAGNOSIS:   STAGE:         ;  GOALS:  CURRENT/MOST RECENT THERAPY :     MDS (myelodysplastic syndrome), high grade ([place])  12/20/2020 Initial Diagnosis   MDS (myelodysplastic syndrome), high grade ([place])   01/06/2021 -  Chemotherapy    Patient is on Treatment Plan: MYELODYSPLASIA  AZACITIDINE IV D1-5 Q28D        ALLERGIES:  has No Known Allergies.  MEDICATIONS:  Current Facility-Administered Medications  Medication Dose Route Frequency Provider Last Rate Last Admin   0.9 %  sodium chloride infusion (Manually program via Guardrails IV Fluids)   Intravenous Once Cammie Sickle, MD       acetaminophen (TYLENOL) tablet 650 mg  650 mg Oral Q6H PRN Irene Pap N, DO       allopurinol (ZYLOPRIM) tablet 200 mg  200 mg Oral Daily Irene Pap N, DO   200 mg at 01/01/21 1045   azithromycin (ZITHROMAX) 500 mg in sodium chloride 0.9 % 250 mL IVPB  500 mg Intravenous Q24H Irene Pap N, DO   Stopped at 12/31/20 1920  ceFEPIme (MAXIPIME) 2 g in sodium chloride 0.9 % 100 mL IVPB  2 g Intravenous Q12H Irene Pap N, DO 200 mL/hr at 01/01/21 1045 2 g at 01/01/21 1045   cholecalciferol (VITAMIN D) tablet 5,000 Units  5,000 Units Oral Daily Irene Pap N, DO   5,000 Units at 01/01/21 1045   hydrocortisone (ANUSOL-HC) 2.5 % rectal cream 1 application  1 application Rectal BID PRN Irene Pap N, DO       ipratropium-albuterol (DUONEB) 0.5-2.5 (3) MG/3ML nebulizer solution 3 mL  3 mL Nebulization Q6H Hall, Carole N, DO   3 mL  at 01/01/21 0748   ipratropium-albuterol (DUONEB) 0.5-2.5 (3) MG/3ML nebulizer solution 3 mL  3 mL Nebulization Q4H PRN [place] Crane, Carole N, DO       lactulose (CHRONULAC) 10 GM/15ML solution 20 g  20 g Oral Once Sharen Hones, MD       melatonin tablet 2.5 mg  2.5 mg Oral QHS PRN Irene Pap N, DO   2.5 mg at 12/30/20 2121   metoprolol tartrate (LOPRESSOR) injection 2.5 mg  2.5 mg Intravenous Q6H PRN Irene Pap N, DO       ondansetron (ZOFRAN) injection 4 mg  4 mg Intravenous Q6H PRN Irene Pap N, DO       oxyCODONE (Oxy IR/ROXICODONE) immediate release tablet 15 mg  15 mg Oral Q4H PRN Sharen Hones, MD   15 mg at 01/01/21 3267   polyethylene glycol (MIRALAX / GLYCOLAX) packet 17 g  17 g Oral Daily Kimbra Marcelino, Kirt Boys, NP   17 g at 01/01/21 1044   rosuvastatin (CRESTOR) tablet 10 mg  10 mg Oral Daily Irene Pap N, DO   10 mg at 01/01/21 1045   senna-docusate (Senokot-S) tablet 1 tablet  1 tablet Oral QHS PRN Shirlena Brinegar, Kirt Boys, NP       vancomycin (VANCOCIN) IVPB 1000 mg/200 mL premix  1,000 mg Intravenous Q24H Benita Gutter, [facility]   Stopped at 12/31/20 2150   vitamin B-12 (CYANOCOBALAMIN) tablet 1,000 mcg  1,000 mcg Oral Daily Irene Pap N, DO   1,000 mcg at 01/01/21 1045    VITAL SIGNS: BP 128/86 (BP Location: Right Arm)   Pulse (!) 121   Temp 98 F (36.7 C)   Resp 17   Ht 5' 10"  (1.778 m)   Wt 195 lb (88.5 kg)   SpO2 98%   BMI 27.98 kg/m  Filed Weights   12/30/20 1149  Weight: 195 lb (88.5 kg)    Estimated body mass index is 27.98 kg/m as calculated from the following:   Height as of this encounter: 5' 10"  (1.778 m).   Weight as of this encounter: 195 lb (88.5 kg).  LABS: CBC:    Component Value Date/Time   WBC 2.1 (L) 01/01/2021 0517   HGB 8.4 (L) 01/01/2021 0517   HGB 7.9 (L) 11/29/2020 0921   HCT 24.2 (L) 01/01/2021 0517   HCT 22.7 (L) 11/29/2020 0921   PLT 28 (LL) 01/01/2021 0517   PLT 96 (LL) 11/29/2020 0921   MCV 94.9 01/01/2021 0517   MCV 104 (H) 11/29/2020  0921   MCV 92 09/01/2014 1845   NEUTROABS 0.6 (L) 01/01/2021 0517   NEUTROABS 3.4 10/10/2011 0934   LYMPHSABS 0.7 01/01/2021 0517   LYMPHSABS 1.6 10/10/2011 0934   MONOABS 0.7 01/01/2021 0517   MONOABS 0.6 10/10/2011 0934   EOSABS 0.0 01/01/2021 0517   EOSABS 0.2 10/10/2011 0934   BASOSABS 0.0 01/01/2021 0517   BASOSABS 0.0  10/10/2011 0934   Comprehensive Metabolic Panel:    Component Value Date/Time   NA 136 01/01/2021 0517   NA 141 11/29/2020 0921   NA 136 06/30/2014 0657   K 4.6 01/01/2021 0517   K 3.6 06/30/2014 0657   CL 106 01/01/2021 0517   CL 102 06/30/2014 0657   CO2 23 01/01/2021 0517   CO2 27 06/30/2014 0657   BUN 50 (H) 01/01/2021 0517   BUN 35 (H) 11/29/2020 0921   BUN 41 (H) 06/30/2014 0657   CREATININE 1.63 (H) 01/01/2021 0517   CREATININE 2.42 (H) 04/26/2017 1123   GLUCOSE 110 (H) 01/01/2021 0517   GLUCOSE 131 (H) 06/30/2014 0657   CALCIUM 8.4 (L) 01/01/2021 0517   CALCIUM 8.6 06/30/2014 0657   AST 28 12/31/2020 0731   AST 28 06/30/2014 0657   ALT 16 12/31/2020 0731   ALT 26 06/30/2014 0657   ALKPHOS 39 12/31/2020 0731   ALKPHOS 61 06/30/2014 0657   BILITOT 1.4 (H) 12/31/2020 0731   BILITOT 0.7 11/29/2020 0921   BILITOT 0.6 06/30/2014 0657   PROT 6.0 (L) 12/31/2020 0731   PROT 6.8 11/29/2020 0921   PROT 7.7 06/30/2014 0657   ALBUMIN 3.1 (L) 12/31/2020 0731   ALBUMIN 4.6 11/29/2020 0921   ALBUMIN 3.7 06/30/2014 0657    RADIOGRAPHIC STUDIES: DG Chest 1 View  Result Date: 12/30/2020 CLINICAL DATA:  Shortness of breath, worsening. EXAM: CHEST  1 VIEW COMPARISON:  11/29/2020 and 12/14/2020 FINDINGS: Substantially progressive bilateral interstitial lung patchy airspace opacities distributed throughout both lungs. No compelling prominence of Kerley B lines. Mild enlargement of the cardiopericardial silhouette is present. No blunting of the costophrenic angles. Prior CABG. Atherosclerotic calcification of the aortic arch. IMPRESSION: 1. Substantially  worsened bilateral interstitial and patchy bilateral airspace opacities, with possibilities including progressive multilobar atypical pneumonia or cardiogenic edema. Cardiogenic edema seems less likely given the paucity of Kerley B lines. However, there is mild enlargement of the cardiopericardial silhouette and the patient has had prior CABG. 2.  Aortic Atherosclerosis (ICD10-I70.0). Electronically Signed   By: Van Clines M.D.   On: 12/30/2020 12:34   CT Chest Wo Contrast  Result Date: 12/14/2020 CLINICAL DATA:  Hemoptysis. EXAM: CT CHEST WITHOUT CONTRAST TECHNIQUE: Multidetector CT imaging of the chest was performed following the standard protocol without IV contrast. COMPARISON:  08/30/2011 FINDINGS: Cardiovascular: Heart is enlarged. Status post CABG. Mild atherosclerotic calcification is noted in the wall of the thoracic aorta. Mediastinum/Nodes: Scattered small mediastinal lymph nodes evident. No evidence for gross hilar lymphadenopathy although assessment is limited by the lack of intravenous contrast on today's study. The esophagus has normal imaging features. There is no axillary lymphadenopathy. Lungs/Pleura: Patchy ground-glass opacities identified in both lungs. Ground-glass disease in the lung bases has superimposed areas of more confluent nodular soft tissue density (including 12 mm paraspinal right lower lobe on image 114/2 and 2.3 cm posterior left lower lobe on 01/20 8/2. Diffuse bronchial wall thickening with cylindrical bronchiectasis noted bilaterally. No pleural effusion. No pneumothorax. Upper Abdomen: 5 mm nonobstructing stone noted upper pole right kidney. 2.7 cm left adrenal lipoma or myelolipoma has decreased from 3.5 cm previously. Musculoskeletal: No worrisome lytic or sclerotic osseous abnormality. Bilateral gynecomastia. Evidence of vertebral augmentation at L1. IMPRESSION: 1. Patchy ground-glass opacities in both lungs with superimposed areas of more confluent nodular soft  tissue density at the bases. Imaging features likely related to infectious/inflammatory etiology and atypical infection would be a consideration. Follow-up CT in 3 months recommended to ensure  resolution. 2. Diffuse bronchial wall thickening with cylindrical bronchiectasis bilaterally. 3. Interval decrease in size of the left adrenal lipoma or myelolipoma. 4. Nonobstructing right renal stone. 5. Aortic Atherosclerosis (ICD10-I70.0). Electronically Signed   By: Misty Stanley M.D.   On: 12/14/2020 10:52   CT Angio Chest PE W and/or Wo Contrast  Result Date: 12/30/2020 CLINICAL DATA:  Pulmonary emboli suspected. High probability. Worsening shortness of breath. EXAM: CT ANGIOGRAPHY CHEST WITH CONTRAST TECHNIQUE: Multidetector CT imaging of the chest was performed using the standard protocol during bolus administration of intravenous contrast. Multiplanar CT image reconstructions and MIPs were obtained to evaluate the vascular anatomy. CONTRAST:  76m OMNIPAQUE IOHEXOL 350 MG/ML SOLN COMPARISON:  Chest radiography same day.  CT chest 12/14/2020. FINDINGS: Cardiovascular: Heart is enlarged. There is four-chamber enlargement, but particular enlargement of the right atrium. There is no pericardial fluid. There is been previous median sternotomy and CABG. Pulmonary arterial opacification is good. There are no pulmonary emboli. Mediastinum/Nodes: No mediastinal or hilar mass or lymphadenopathy. Lungs/Pleura: Considerable worsening of bilateral pulmonary infiltrates with a geographic pattern. Findings suggest viral pneumonia, possibly coronavirus. Small amount of pleural fluid on the left. No dense lobar consolidation or collapse. Upper Abdomen: No acute finding. Nonobstructing renal stone on the right. Chronic 2.8 cm fatty mass of the left adrenal gland. Musculoskeletal: Chronic thoracic degenerative changes. Old T12 compression fracture treated with vertebral augmentation. Review of the MIP images confirms the above  findings. IMPRESSION: No pulmonary emboli. Marked worsening of diffuse geographic pulmonary infiltrates consistent with worsening atypical pneumonia, possibly coronavirus. Four-chamber cardiac enlargement, with particular enlargement of the right atrium. Aortic Atherosclerosis (ICD10-I70.0). Electronically Signed   By: MNelson ChimesM.D.   On: 12/30/2020 15:19   [place]Abdomen Complete  Result Date: 12/14/2020 CLINICAL DATA:  Pancytopenia EXAM: ABDOMEN ULTRASOUND COMPLETE COMPARISON:  November 14, 2015. FINDINGS: Gallbladder: No gallstones or wall thickening visualized. No sonographic Murphy sign noted by sonographer. Common bile duct: Diameter: 3 mm Liver: No focal lesion identified. Mildly heterogeneous liver echotexture. Revisualization of a mildly nodular contour of the liver similar comparison to prior. Portal vein is patent on color Doppler imaging with normal direction of blood flow towards the liver. IVC: No abnormality visualized. Pancreas: Visualized portion unremarkable. Spleen: Size and appearance within normal limits. Right Kidney: Length: 9.9 cm. Echogenicity within normal limits. No mass or hydronephrosis visualized. Left Kidney: Length: 11.9 cm. Echogenicity within normal limits. No mass or hydronephrosis visualized. Abdominal aorta: Limited assessment secondary to shadowing bowel gas of the distal aorta. Within these limitations, no aneurysm visualized proximally. Other findings: None. IMPRESSION: 1. No sonographic evidence of splenomegaly. 2. Mildly nodular contour of the liver with heterogeneous echotexture may reflect underlying cirrhosis. No focal lesion identified. Electronically Signed   By: SValentino SaxonMD   On: 12/14/2020 14:14   CT BONE MARROW BIOPSY & ASPIRATION  Result Date: 12/16/2020 INDICATION: Pancytopenia. EXAM: CT GUIDED BONE MARROW ASPIRATES AND BIOPSY Physician: AStephan Minister HAnselm Pancoast MD MEDICATIONS: None. ANESTHESIA/SEDATION: Fentanyl 50 mcg IV; Versed 1.0 mg IV Moderate Sedation  Time:  10 minutes The patient was continuously monitored during the procedure by the interventional radiology nurse under my direct supervision. COMPLICATIONS: None immediate. PROCEDURE: The procedure was explained to the patient. The risks and benefits of the procedure were discussed and the patient's questions were addressed. Informed consent was obtained from the patient. The patient was placed prone on CT table. Images of the pelvis were obtained. The right side of back was prepped and draped in  sterile fashion. The skin and right posterior ilium were anesthetized with 1% lidocaine. 11 gauge bone needle was directed into the right ilium with CT guidance. Two aspirates and one core biopsy were obtained. Bandage placed over the puncture site. IMPRESSION: CT guided bone marrow aspiration and core biopsy. Electronically Signed   By: Markus Daft M.D.   On: 12/16/2020 11:28    PERFORMANCE STATUS (ECOG) : 2 - Symptomatic, <50% confined to bed  Review of Systems Unless otherwise noted, a complete review of systems is negative.  Physical Exam General: NAD Pulmonary: unlabored Extremities: no edema, no joint deformities Skin: no rashes Neurological: Weakness but otherwise nonfocal  IMPRESSION: Dr. Rogue Bussing and I met with patient and family (son and daughter).  Patient stated clearly and repeatedly that he wanted "every option available" to prolong his life.  He says that he wants to remain optimistic and "knows" that he will make a full recovery.  After an extensive conversation regarding the probable futility associated with resuscitative efforts including our medical recommendation for DNR, patient stated that he wanted to remain a full code.  I met privately with patient's son and daughter.  They report that patient has a poor quality of life at baseline living is essentially a bed to chair existence.  They anticipate that he may do poorly going forward.  PLAN: -Continue current scope of  treatment -Full code  Case and plan discussed with Dr. Rogue Bussing   Time Total: 35 minutes  Visit consisted of counseling and education dealing with the complex and emotionally intense issues of symptom management and palliative care in the setting of serious and potentially life-threatening illness.Greater than 50%  of this time was spent counseling and coordinating care related to the above assessment and plan.  Signed by: Altha Harm, PhD, NP-C

## 2021-01-01 NOTE — Progress Notes (Signed)
Patient reports feeling wheezy. Patient states :I don't feel like I am getting enough oxygen while I am sleeping and it keeps waking me up".  Educated patient on the purpose of CPAP/BIPAP machine while sleeping, pt states he doesn't believe he has OSA, and says states this only happens when I am wheezing.  Contacted RT for PRN neb.

## 2021-01-01 NOTE — Progress Notes (Signed)
PROGRESS NOTE    Vincent Mason  [id number] DOB: 1935-06-27 DOA: 12/30/2020 PCP: Birdie Sons, MD   Chief Complaint.  Shortness of breath. Brief Narrative:  Vincent Mason is a 85 y.o. 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 fib not on oral anticoagulation, CKD 3B, chronic back pain, recent admission 7/23-7/26 for hemoptysis, who presented to [facility] ED due to worsening shortness of breath. Work-up showed multifocal pneumonia on CT chest, white cell count 1.7, platelet count 16, hemoglobin 7.2.   Assessment & Plan:   Active Problems:   Chronic atrial fibrillation (HCC)   Multifocal pneumonia   Neutropenic sepsis ([place])   Acute hypoxemic respiratory failure ([place])  #1.  Neutropenic sepsis secondary to multifocal pneumonia. Acute hypoxemic respiratory failure secondary to multifocal pneumonia Multifocal pneumonia. Appreciate ID consult, fungal testing sent out. Antibiotics continued. Patient oxygenation is better, currently on 2 L oxygen.  2.  Newly diagnosed high-grade MDS. Pancytopenia secondary to MDS. Patient is a followed by oncology, leukopenia is better today.  3.  Chronic atrial fibrillation. Chronic systolic congestive heart failure  Coronary artery disease status post CABG and PCI. Elevated troponin secondary to sepsis. Conditions are stable.  4.  Chronic kidney disease stage IIIb. Renal function stable.   DVT prophylaxis: SCDs Code Status: full Family Communication:  Disposition Plan:    Status is: Inpatient  Remains inpatient appropriate because:IV treatments appropriate due to intensity of illness or inability to take PO and Inpatient level of care appropriate due to severity of illness  Dispo:  Patient From: Home  Planned Disposition: Home  Medically stable for discharge: No          I/O last 3 completed shifts: In: 1719.2 [P.O.:480; Blood:466; IV  Piggyback:773.2] Out: 1600 [Urine:1600] Total I/O In: -  Out: 250 [Urine:250]     Consultants:  ID, Oncology  Procedures: None  Antimicrobials: Vancomycin, cefepime and Zithromax.  Subjective: Patient seem to be better today, still has some short of breath with exertion.  Still on 2 L oxygen. Otherwise he feels better. He denies any abdominal pain nausea vomiting. No fever chills No dysuria or hematuria.   Objective: Vitals:   01/01/21 0440 01/01/21 0748 01/01/21 0818 01/01/21 1212  BP: 120/80  133/74 128/86  Pulse: 93  99 (!) 121  Resp: '18  18 17  '$ Temp: 97.6 F (36.4 C)  98 F (36.7 C) 98 F (36.7 C)  TempSrc:   Oral   SpO2: 100% 98% 99% 98%  Weight:      Height:        Intake/Output Summary (Last 24 hours) at 01/01/2021 1321 Last data filed at 01/01/2021 0820 Gross per 24 hour  Intake 946 ml  Output 900 ml  Net 46 ml   Filed Weights   12/30/20 1149  Weight: 88.5 kg    Examination:  General exam: Appears calm and comfortable  Respiratory system: Crackles in the base.Marland Kitchen Respiratory effort normal. Cardiovascular system: S1 & S2 heard, RRR. No JVD, murmurs, rubs, gallops or clicks. No pedal edema. Gastrointestinal system: Abdomen is nondistended, soft and nontender. No organomegaly or masses felt. Normal bowel sounds heard. Central nervous system: Alert and oriented. No focal neurological deficits. Extremities: Symmetric 5 x 5 power. Skin: No rashes, lesions or ulcers Psychiatry: Mood & affect appropriate.     Data Reviewed: I have personally reviewed following labs and imaging studies  CBC: Recent Labs  Lab 12/27/20 0922 12/30/20 1200  12/30/20 2014 12/31/20 0731 01/01/21 0517  WBC 2.0* 1.7*  --  1.1* 2.1*  NEUTROABS 0.5* 0.4*  --  0.4* 0.6*  HGB 7.1* 7.2* 8.0* 7.3* 8.4*  HCT 21.7* 21.9* 24.2* 22.0* 24.2*  MCV 103.8* 99.5  --  97.8 94.9  PLT 18* 16*  --  33* 28*   Basic Metabolic Panel: Recent Labs  Lab 12/27/20 0922 12/30/20 1200  12/31/20 0731 01/01/21 0517  NA 136 137 137 136  K 4.3 4.9 4.8 4.6  CL 102 108 106 106  CO2 24 23 21* 23  GLUCOSE 111* 100* 141* 110*  BUN 34* 46* 54* 50*  CREATININE 1.74* 1.94* 1.84* 1.63*  CALCIUM 8.6* 8.6* 8.1* 8.4*  MG  --   --  2.4 2.6*  PHOS  --   --  4.2 3.0   GFR: Estimated Creatinine Clearance: 38.5 mL/min (A) (by C-G formula based on SCr of 1.63 mg/dL (H)). Liver Function Tests: Recent Labs  Lab 12/30/20 1200 12/31/20 0731  AST 23 28  ALT 14 16  ALKPHOS 46 39  BILITOT 1.4* 1.4*  PROT 6.6 6.0*  ALBUMIN 3.4* 3.1*   No results for input(s): LIPASE, AMYLASE in the last 168 hours. No results for input(s): AMMONIA in the last 168 hours. Coagulation Profile: No results for input(s): INR, PROTIME in the last 168 hours. Cardiac Enzymes: No results for input(s): CKTOTAL, CKMB, CKMBINDEX, TROPONINI in the last 168 hours. BNP (last 3 results) No results for input(s): PROBNP in the last 8760 hours. HbA1C: No results for input(s): HGBA1C in the last 72 hours. CBG: No results for input(s): GLUCAP in the last 168 hours. Lipid Profile: No results for input(s): CHOL, HDL, LDLCALC, TRIG, CHOLHDL, LDLDIRECT in the last 72 hours. Thyroid Function Tests: No results for input(s): TSH, T4TOTAL, FREET4, T3FREE, THYROIDAB in the last 72 hours. Anemia Panel: No results for input(s): VITAMINB12, FOLATE, FERRITIN, TIBC, IRON, RETICCTPCT in the last 72 hours. Sepsis Labs: Recent Labs  Lab 12/31/20 1950  PROCALCITON 0.80    Recent Results (from the past 240 hour(s))  Resp Panel by RT-PCR (Flu A&B, Covid) Nasopharyngeal Swab     Status: None   Collection Time: 12/30/20 12:24 PM   Specimen: Nasopharyngeal Swab; Nasopharyngeal(NP) swabs in vial transport medium  Result Value Ref Range Status   SARS Coronavirus 2 by RT PCR NEGATIVE NEGATIVE Final    Comment: (NOTE) SARS-CoV-2 target nucleic acids are NOT DETECTED.  The SARS-CoV-2 RNA is generally detectable in upper  respiratory specimens during the acute phase of infection. The lowest concentration of SARS-CoV-2 viral copies this assay can detect is 138 copies/mL. A negative result does not preclude SARS-Cov-2 infection and should not be used as the sole basis for treatment or other patient management decisions. A negative result may occur with  improper specimen collection/handling, submission of specimen other than nasopharyngeal swab, presence of viral mutation(s) within the areas targeted by this assay, and inadequate number of viral copies(<138 copies/mL). A negative result must be combined with clinical observations, patient history, and epidemiological information. The expected result is Negative.  Fact Sheet for Patients:  EntrepreneurPulse.com.au  Fact Sheet for Healthcare Providers:  IncredibleEmployment.be  This test is no t yet approved or cleared by the [place] FDA and  has been authorized for detection and/or diagnosis of SARS-CoV-2 by FDA under an Emergency Use Authorization (EUA). This EUA will remain  in effect (meaning this test can be used) for the duration of the COVID-19 declaration under Section 564(b)(1)  of the Act, 21 U.S.C.section 360bbb-3(b)(1), unless the authorization is terminated  or revoked sooner.       Influenza A by PCR NEGATIVE NEGATIVE Final   Influenza B by PCR NEGATIVE NEGATIVE Final    Comment: (NOTE) The Xpert Xpress SARS-CoV-2/FLU/RSV plus assay is intended as an aid in the diagnosis of influenza from Nasopharyngeal swab specimens and should not be used as a sole basis for treatment. Nasal washings and aspirates are unacceptable for Xpert Xpress SARS-CoV-2/FLU/RSV testing.  Fact Sheet for Patients: EntrepreneurPulse.com.au  Fact Sheet for Healthcare Providers: IncredibleEmployment.be  This test is not yet approved or cleared by the [place] FDA and has been  authorized for detection and/or diagnosis of SARS-CoV-2 by FDA under an Emergency Use Authorization (EUA). This EUA will remain in effect (meaning this test can be used) for the duration of the COVID-19 declaration under Section 564(b)(1) of the Act, 21 U.S.C. section 360bbb-3(b)(1), unless the authorization is terminated or revoked.  Performed at [facility], [address] [place], [place] [address]   Blood culture (routine x 2)     Status: None (Preliminary result)   Collection Time: 12/30/20  1:59 PM   Specimen: BLOOD  Result Value Ref Range Status   Specimen Description BLOOD BLOOD RIGHT FOREARM  Final   Special Requests   Final    BOTTLES DRAWN AEROBIC AND ANAEROBIC Blood Culture adequate volume   Culture   Final    NO GROWTH 2 DAYS Performed at [facility], [address] [place], [place] [address]    Report Status PENDING  Incomplete  Blood culture (routine x 2)     Status: None (Preliminary result)   Collection Time: 12/30/20  1:59 PM   Specimen: BLOOD  Result Value Ref Range Status   Specimen Description BLOOD LEFT ANTECUBITAL  Final   Special Requests   Final    BOTTLES DRAWN AEROBIC AND ANAEROBIC Blood Culture adequate volume   Culture   Final    NO GROWTH 2 DAYS Performed at [facility], [address], [place], [place] [address]    Report Status PENDING  Incomplete         Radiology Studies: CT Angio Chest PE W and/or Wo Contrast  Result Date: 12/30/2020 CLINICAL DATA:  Pulmonary emboli suspected. High probability. Worsening shortness of breath. EXAM: CT ANGIOGRAPHY CHEST WITH CONTRAST TECHNIQUE: Multidetector CT imaging of the chest was performed using the standard protocol during bolus administration of intravenous contrast. Multiplanar CT image reconstructions and MIPs were obtained to evaluate the vascular anatomy. CONTRAST:  59m OMNIPAQUE IOHEXOL 350 MG/ML SOLN COMPARISON:  Chest radiography same day.  CT chest  12/14/2020. FINDINGS: Cardiovascular: Heart is enlarged. There is four-chamber enlargement, but particular enlargement of the right atrium. There is no pericardial fluid. There is been previous median sternotomy and CABG. Pulmonary arterial opacification is good. There are no pulmonary emboli. Mediastinum/Nodes: No mediastinal or hilar mass or lymphadenopathy. Lungs/Pleura: Considerable worsening of bilateral pulmonary infiltrates with a geographic pattern. Findings suggest viral pneumonia, possibly coronavirus. Small amount of pleural fluid on the left. No dense lobar consolidation or collapse. Upper Abdomen: No acute finding. Nonobstructing renal stone on the right. Chronic 2.8 cm fatty mass of the left adrenal gland. Musculoskeletal: Chronic thoracic degenerative changes. Old T12 compression fracture treated with vertebral augmentation. Review of the MIP images confirms the above findings. IMPRESSION: No pulmonary emboli. Marked worsening of diffuse geographic pulmonary infiltrates consistent with worsening atypical pneumonia, possibly coronavirus. Four-chamber cardiac enlargement,  with particular enlargement of the right atrium. Aortic Atherosclerosis (ICD10-I70.0). Electronically Signed   By: Nelson Chimes M.D.   On: 12/30/2020 15:19        Scheduled Meds:  sodium chloride   Intravenous Once   allopurinol  200 mg Oral Daily   cholecalciferol  5,000 Units Oral Daily   ipratropium-albuterol  3 mL Nebulization Q6H   lactulose  20 g Oral Once   polyethylene glycol  17 g Oral Daily   rosuvastatin  10 mg Oral Daily   vitamin B-12  1,000 mcg Oral Daily   Continuous Infusions:  azithromycin Stopped (12/31/20 1920)   ceFEPime (MAXIPIME) IV 2 g (01/01/21 1045)   vancomycin Stopped (12/31/20 2150)     LOS: 2 days    Time spent: 28 minutes    Sharen Hones, MD [facility]   To contact the attending provider between 7A-7P or the covering provider during after hours 7P-7A, please log into  the web site [email or web address] and access using universal [place] password for that web site. If you do not have the password, please call the hospital operator.  01/01/2021, 1:21 PM

## 2021-01-01 NOTE — Progress Notes (Signed)
Patient requested PRN oxycodone 15 mg, pt asked this RN if he could get 4 tablets instead of 3 tablets.  Explained to patient again that the provider prescribed 15 mg, and it was administered per the order.  Pt stated "his son brought his home medication (15 mg tab oxycodone) and that it was in the pharmacy, and stated that pharmacy said home med could not be administered"  Reiterated to patient this RN could only administer as ordered and that he should discuss his concerns with the provider

## 2021-01-01 NOTE — Progress Notes (Signed)
Date of Admission:  12/30/2020     ID: Vincent Mason is a 85 y.o. male  Active Problems:   Chronic atrial fibrillation (HCC)   Multifocal pneumonia   Neutropenic sepsis ([place])   Acute hypoxemic respiratory failure (HCC)    Subjective: Pt says he is feeling fine Some sob Never had fever  Medications:   sodium chloride   Intravenous Once   allopurinol  200 mg Oral Daily   cholecalciferol  5,000 Units Oral Daily   ipratropium-albuterol  3 mL Nebulization Q6H   lactulose  20 g Oral Once   polyethylene glycol  17 g Oral Daily   rosuvastatin  10 mg Oral Daily   vitamin B-12  1,000 mcg Oral Daily    Objective: Vital signs in last 24 hours: Temp:  [97.6 F (36.4 C)-98.4 F (36.9 C)] 98 F (36.7 C) (08/10 1212) Pulse Rate:  [93-121] 121 (08/10 1212) Resp:  [17-24] 17 (08/10 1212) BP: (110-133)/(59-95) 128/86 (08/10 1212) SpO2:  [95 %-100 %] 98 % (08/10 1212) FiO2 (%):  [3 %] 3 % (08/09 1227) Patient Vitals for the past 24 hrs:  BP Temp Temp src Pulse Resp SpO2  01/01/21 1948 137/69 98.6 F (37 C) -- (!) 106 18 99 %  01/01/21 1913 -- -- -- -- -- 98 %  01/01/21 1829 124/64 98 F (36.7 C) Oral (!) 112 18 98 %  01/01/21 1412 130/67 97.9 F (36.6 C) Oral (!) 114 18 --  01/01/21 1212 128/86 98 F (36.7 C) -- (!) 121 17 98 %  01/01/21 0818 133/74 98 F (36.7 C) Oral 99 18 99 %  01/01/21 0748 -- -- -- -- -- 98 %  01/01/21 0440 120/80 97.6 F (36.4 C) -- 93 18 100 %     PHYSICAL EXAM:  General: Alert, cooperative, no distress, on nasal cannula Head: Normocephalic, without obvious abnormality, atraumatic. Eyes: Conjunctivae clear, anicteric sclerae. Pupils are equal ENT Nares normal. No drainage or sinus tenderness. Lips, mucosa, and tongue normal. No Thrush Neck: Supple, symmetrical, no adenopathy, thyroid: non tender no carotid bruit and no JVD. Back: No CVA tenderness. Lungs: b/l air entry Few crepts Heart: Regular rate and rhythm, no murmur, rub or  gallop. Abdomen: Soft, some distension. Bowel sounds normal. No masses Extremities: atraumatic, no cyanosis. No edema. No clubbing Skin: No rashes or lesions. Or bruising Lymph: Cervical, supraclavicular normal. Neurologic: Grossly non-focal  Lab Results Recent Labs    12/31/20 0731 01/01/21 0517  WBC 1.1* 2.1*  HGB 7.3* 8.4*  HCT 22.0* 24.2*  NA 137 136  K 4.8 4.6  CL 106 106  CO2 21* 23  BUN 54* 50*  CREATININE 1.84* 1.63*   Liver Panel Recent Labs    12/30/20 1200 12/31/20 0731  PROT 6.6 6.0*  ALBUMIN 3.4* 3.1*  AST 23 28  ALT 14 16  ALKPHOS 46 39  BILITOT 1.4* 1.4*   Sedimentation Rate No results for input(s): ESRSEDRATE in the last 72 hours. C-Reactive Protein No results for input(s): CRP in the last 72 hours.  Microbiology:  Studies/Results: CT Angio Chest PE W and/or Wo Contrast  Result Date: 12/30/2020 CLINICAL DATA:  Pulmonary emboli suspected. High probability. Worsening shortness of breath. EXAM: CT ANGIOGRAPHY CHEST WITH CONTRAST TECHNIQUE: Multidetector CT imaging of the chest was performed using the standard protocol during bolus administration of intravenous contrast. Multiplanar CT image reconstructions and MIPs were obtained to evaluate the vascular anatomy. CONTRAST:  51m OMNIPAQUE IOHEXOL 350 MG/ML SOLN COMPARISON:  Chest radiography same day.  CT chest 12/14/2020. FINDINGS: Cardiovascular: Heart is enlarged. There is four-chamber enlargement, but particular enlargement of the right atrium. There is no pericardial fluid. There is been previous median sternotomy and CABG. Pulmonary arterial opacification is good. There are no pulmonary emboli. Mediastinum/Nodes: No mediastinal or hilar mass or lymphadenopathy. Lungs/Pleura: Considerable worsening of bilateral pulmonary infiltrates with a geographic pattern. Findings suggest viral pneumonia, possibly coronavirus. Small amount of pleural fluid on the left. No dense lobar consolidation or collapse. Upper  Abdomen: No acute finding. Nonobstructing renal stone on the right. Chronic 2.8 cm fatty mass of the left adrenal gland. Musculoskeletal: Chronic thoracic degenerative changes. Old T12 compression fracture treated with vertebral augmentation. Review of the MIP images confirms the above findings. IMPRESSION: No pulmonary emboli. Marked worsening of diffuse geographic pulmonary infiltrates consistent with worsening atypical pneumonia, possibly coronavirus. Four-chamber cardiac enlargement, with particular enlargement of the right atrium. Aortic Atherosclerosis (ICD10-I70.0). Electronically Signed   By: Nelson Chimes M.D.   On: 12/30/2020 15:19     Assessment/Plan: Pt admitted with worsening SOB, NEVER had fever  Neutropenia with pulmonary infiltrates- Never had fever. No hypotension So cannot say this is sepsis or febrile neutropenia Also he has never been on any chemo or other intervention for MDS . This neutropenia is primarily from MDS and is actually better today ( [place] > 600) So Ois and fungal infection less likely He is of course prone for viral/ bacterial infection  Acute on chronic hypoxic resp failure with pulmonary infiltrates Will r/o viral pneumonia- will get RVP  Also will send other opportunistic panel MRSA nares neg- so will discontinue vanco as unlikely to be MRSA pneumonia Continue cefepime + azithrmycin for now Await Resp viral pathogen panel to exclude viral causes like adeno, para influenza, RSV, metapneumovirus need to think of other causes including TRALI, TACO, CHF, pulmonary hemorrhage ( had hemoptysis before). Also previous CXR from past have been reported as some early interstitial disease and COPD  Severe pancytopenia Severe thrombocytopenia   CAD s/p CABG  Chronic afib- well controlled  Discussed the management with care team

## 2021-01-01 NOTE — Progress Notes (Signed)
Patient called, and wanted to be taken off BIPAP again.  Patient states it was hurting nose.  Placed pt back on nasal cannula, pt then reports nasal cannula was hurting, and asked RN if he needed oxygen.  Educated patient on the need for oxygen via [place] or BIPAP.    Provided patient with a cup of water at his request

## 2021-01-01 NOTE — Progress Notes (Signed)
Vincent Mason   DOB:07/01/1935   AU#:[id number]    Subjective: pt denies fevers or chills.  Denies any shortness of breath.  Patient needed BiPAP overnight.  Currently on 3-4 lits of oxygen.  No bleeding.   Objective:  Vitals:   01/01/21 1913 01/01/21 1948  BP:  137/69  Pulse:  (!) 106  Resp:  18  Temp:  98.6 F (37 C)  SpO2: 98% 99%     Intake/Output Summary (Last 24 hours) at 01/01/2021 2215 Last data filed at 01/01/2021 1500 Gross per 24 hour  Intake 600 ml  Output 950 ml  Net -350 ml    Physical Exam Vitals and nursing note reviewed.  Constitutional:      Comments: Patient resting in the bed; accompanied by his daughter/son.   HENT:     Head: Normocephalic and atraumatic.     Mouth/Throat:     Mouth: Mucous membranes are moist.     Pharynx: No oropharyngeal exudate.  Eyes:     Pupils: Pupils are equal, round, and reactive to light.  Cardiovascular:     Rate and Rhythm: Normal rate and regular rhythm.  Pulmonary:     Effort: No respiratory distress.     Breath sounds: No wheezing.     Comments: Decreased breath sounds bilaterally at bases.  No wheeze or crackles Abdominal:     General: Bowel sounds are normal. There is no distension.     Palpations: Abdomen is soft. There is no mass.     Tenderness: There is no abdominal tenderness. There is no guarding or rebound.  Musculoskeletal:        General: No tenderness. Normal range of motion.     Cervical back: Normal range of motion and neck supple.  Skin:    General: Skin is warm.  Neurological:     Mental Status: He is alert and oriented to person, place, and time.  Psychiatric:        Mood and Affect: Affect normal.        Judgment: Judgment normal.    Labs:  Lab Results  Component Value Date   WBC 2.1 (L) 01/01/2021   HGB 8.4 (L) 01/01/2021   HCT 24.2 (L) 01/01/2021   MCV 94.9 01/01/2021   PLT 28 (LL) 01/01/2021   NEUTROABS 0.6 (L) 01/01/2021    Lab Results  Component Value Date   NA 136 01/01/2021    K 4.6 01/01/2021   CL 106 01/01/2021   CO2 23 01/01/2021    Studies:  DG Chest Port 1 View  Result Date: 01/01/2021 CLINICAL DATA:  Short of breath EXAM: PORTABLE CHEST 1 VIEW COMPARISON:  12/30/2020 FINDINGS: Prior CABG. Cardiac enlargement. Diffuse bilateral airspace disease has progressed in the interval. This is asymmetric and more prominent on the right. Minimal pleural effusion bilaterally. IMPRESSION: Diffuse bilateral airspace disease has progressed. Differential diagnosis includes pneumonia and edema. Electronically Signed   By: Franchot Gallo M.D.   On: 01/01/2021 14:55    MDS (myelodysplastic syndrome), high grade [facility]) #85 year old male patient with high-grade MDS/severe pancytopenia; CKD stage III/IV-is currently admitted hospital for acute respiratory failure/multifocal pneumonia  # High-grade MDS; July 2022 bone marrow biopsy- Myelodysplastic syndrome with excess blasts [increased blasts -6%-8%;  with marked trilineage dysplasia.];R- IPSS:5.5- HIGH RISK-see below.   #Acute respiratory failure/multifocal pneumonia-in the context of severe neutropenia/immunosuppression -initially BiPAP overnight; currently on 3 L of oxygen saturation 92-94%. Currently on broad-spectrum antibiotics blood cultures negative.  Question atypical infection- fungal vs Viral  vs TRALI.  Discussed with Dr. Ravisankar-agree with pulmonary evaluation.   # Anemia/thrombocytopenia/neutropenia-hemoglobin 8.3 on presentation s/p 2 units of PRBC transfusion; platelets 28 s/p 2 unit of platelet transfusion given hemoptysis.  Severe neutropenia ANC-400; hold off any growth factor.  #Prognosis/CODE STATUS: Discussed at length with the patient and family.  I had a long discussion with patient's prognosis unfortunately quite poor given his underlying severe neutropenia/immunosuppression.  Also reviewed the CODE STATUS at length-that I would strongly recommend DNR/DNI.  Patient adamant that he wants to be full code even  after explaining to him that if he does undergo resuscitation/it could end up on the ventilator/and then not get any further therapies.   Patient also met with palliative care Josh Borders who also explained the significant concerns of being a full code in the context of his grave illness. Patient and family seem to be in disagreement about the CODE STATUS.    R , MD 01/01/2021  10:15 PM   

## 2021-01-02 ENCOUNTER — Inpatient Hospital Stay
Admit: 2021-01-02 | Discharge: 2021-01-02 | Disposition: A | Discharge status: Home / Self Care | Payer: Medicare Other | Attending: Internal Medicine | Admitting: Internal Medicine

## 2021-01-02 DIAGNOSIS — D46Z Other myelodysplastic syndromes: Secondary | ICD-10-CM

## 2021-01-02 DIAGNOSIS — K625 Hemorrhage of anus and rectum: Secondary | ICD-10-CM | POA: Diagnosis not present

## 2021-01-02 DIAGNOSIS — R17 Unspecified jaundice: Secondary | ICD-10-CM | POA: Diagnosis not present

## 2021-01-02 DIAGNOSIS — K644 Residual hemorrhoidal skin tags: Secondary | ICD-10-CM | POA: Diagnosis not present

## 2021-01-02 LAB — RESPIRATORY PANEL BY PCR

## 2021-01-02 LAB — CBC WITH DIFFERENTIAL/PLATELET
Abs Immature Granulocytes: 0.07 10*3/uL (ref 0.00–0.07)
Basophils Absolute: 0 10*3/uL (ref 0.0–0.1)
Basophils Relative: 0 %
Eosinophils Absolute: 0 10*3/uL (ref 0.0–0.5)
Eosinophils Relative: 0 %
HCT: 23.7 % — ABNORMAL LOW (ref 39.0–52.0)
Hemoglobin: 8.1 g/dL — ABNORMAL LOW (ref 13.0–17.0)
Immature Granulocytes: 5 %
Lymphocytes Relative: 47 %
Lymphs Abs: 0.7 10*3/uL (ref 0.7–4.0)
MCH: 32.5 pg (ref 26.0–34.0)
MCHC: 34.2 g/dL (ref 30.0–36.0)
MCV: 95.2 fL (ref 80.0–100.0)
Monocytes Absolute: 0.4 10*3/uL (ref 0.1–1.0)
Monocytes Relative: 28 %
Neutro Abs: 0.3 10*3/uL — CL (ref 1.7–7.7)
Neutrophils Relative %: 20 %
Platelets: 28 10*3/uL — CL (ref 150–400)
RBC: 2.49 MIL/uL — ABNORMAL LOW (ref 4.22–5.81)
RDW: 20.3 % — ABNORMAL HIGH (ref 11.5–15.5)
Smear Review: NORMAL
WBC: 1.4 10*3/uL — CL (ref 4.0–10.5)
nRBC: 2.1 % — ABNORMAL HIGH (ref 0.0–0.2)

## 2021-01-02 LAB — BASIC METABOLIC PANEL
Anion gap: 4 — ABNORMAL LOW (ref 5–15)
BUN: 44 mg/dL — ABNORMAL HIGH (ref 8–23)
CO2: 23 mmol/L (ref 22–32)
Calcium: 8.4 mg/dL — ABNORMAL LOW (ref 8.9–10.3)
Chloride: 109 mmol/L (ref 98–111)
Creatinine, Ser: 1.52 mg/dL — ABNORMAL HIGH (ref 0.61–1.24)
GFR, Estimated: 45 mL/min — ABNORMAL LOW (ref >60)
Glucose, Bld: 99 mg/dL (ref 70–99)
Potassium: 4.9 mmol/L (ref 3.5–5.1)
Sodium: 136 mmol/L (ref 135–145)

## 2021-01-02 LAB — ECHOCARDIOGRAM COMPLETE
AV Mean grad: 5 mmHg
AV Peak grad: 7.8 mmHg
Ao pk vel: 1.4 m/s
Area-P 1/2: 5.32 cm2
Height: 70 in
S' Lateral: 4.23 cm
Weight: 3120 oz

## 2021-01-02 LAB — LEGIONELLA PNEUMOPHILA SEROGP 1 UR AG: L. pneumophila Serogp 1 Ur Ag: NEGATIVE

## 2021-01-02 LAB — CRYPTOCOCCAL ANTIGEN: Crypto Ag: NEGATIVE

## 2021-01-02 MED ORDER — METOPROLOL TARTRATE 25 MG PO TABS
25.0000 mg | ORAL_TABLET | Freq: Two times a day (BID) | ORAL | Status: DC
  Administered 2021-01-02 – 2021-01-05 (×7): 25 mg via ORAL
  Filled 2021-01-02 (×7): qty 1

## 2021-01-02 MED ORDER — FUROSEMIDE 10 MG/ML IJ SOLN
40.0000 mg | Freq: Once | INTRAMUSCULAR | Status: AC
  Administered 2021-01-02: 40 mg via INTRAVENOUS
  Filled 2021-01-02: qty 4

## 2021-01-02 MED ORDER — IPRATROPIUM-ALBUTEROL 0.5-2.5 (3) MG/3ML IN SOLN
3.0000 mL | Freq: Two times a day (BID) | RESPIRATORY_TRACT | Status: DC
  Administered 2021-01-02 – 2021-01-03 (×2): 3 mL via RESPIRATORY_TRACT
  Filled 2021-01-02 (×2): qty 3

## 2021-01-02 MED ORDER — SODIUM CHLORIDE 0.9% IV SOLUTION: Freq: Once | INTRAVENOUS | Status: AC

## 2021-01-02 NOTE — Progress Notes (Signed)
ID Pt was noted to have minimal rectal bleeding Seen by GI Breathing is stable On 3 L oxygen BP 113/70 (BP Location: Right Arm)   Pulse 99   Temp 97.9 F (36.6 C)   Resp 17   Ht '5\' 10"'$  (1.778 m)   Wt 88.5 kg   SpO2 96%   BMI 27.98 kg/m   Awake and alert Chest b/l air entry Few basal crepts Hss1s2 Abd soft  Labs CBC Latest Ref Rng & Units 01/02/2021 01/01/2021 12/31/2020  WBC 4.0 - 10.5 K/uL 1.4(LL) 2.1(L) 1.1(LL)  Hemoglobin 13.0 - 17.0 g/dL 8.1(L) 8.4(L) 7.3(L)  Hematocrit 39.0 - 52.0 % 23.7(L) 24.2(L) 22.0(L)  Platelets 150 - 400 K/uL 28(LL) 28(LL) 33(L)    CMP Latest Ref Rng & Units 01/02/2021 01/01/2021 12/31/2020  Glucose 70 - 99 mg/dL 99 110(H) 141(H)  BUN 8 - 23 mg/dL 44(H) 50(H) 54(H)  Creatinine 0.61 - 1.24 mg/dL 1.52(H) 1.63(H) 1.84(H)  Sodium 135 - 145 mmol/L 136 136 137  Potassium 3.5 - 5.1 mmol/L 4.9 4.6 4.8  Chloride 98 - 111 mmol/L 109 106 106  CO2 22 - 32 mmol/L 23 23 21(L)  Calcium 8.9 - 10.3 mg/dL 8.4(L) 8.4(L) 8.1(L)  Total Protein 6.5 - 8.1 g/dL - - 6.0(L)  Total Bilirubin 0.3 - 1.2 mg/dL - - 1.4(H)  Alkaline Phos 38 - 126 U/L - - 39  AST 15 - 41 U/L - - 28  ALT 0 - 44 U/L - - 16    infectious disease work-up Beta D glucan Fungal antibody Histoplasma antigen Galactomannan CMV PCR Cryptococcal antigen have all been sent.  Impression/recommendation 85 year old male with newly diagnosed severe myelodysplastic syndrome was admitted with shortness of breath and pancytopenia. Never had fever  acute hypoxic respiratory failure secondary to bilateral pulmonary infiltrate. He is neutropenic.  Never had fever.  He has not had any treatment for his myelodysplastic syndrome.  So opportunistic infections and fungal infections are less likely but still remotely possible.  Respiratory viral pathogen PCR is negative.  Blood cultures are negative. MRSA nares is negative.  So vancomycin was discontinued yesterday.  He is on cefepime plus azithromycin.  Can stop  azithromycin tomorrow. Awaiting beta D glucan.  If that is high then we can start him on Bactrim or appropriate antifungal therapy Noninfectious causes including CHF, trolley, taco, pulmonary hemorrhage are all in the differential diagnosis.  Also previous x-rays have been reported as some early interstitial disease and COPD.      Severe thrombocytopenia  CAD status post CABG  Chronic A. fib well-controlled  Discussed the management with patient and care team.

## 2021-01-02 NOTE — Progress Notes (Signed)
Vincent Mason   DOB:November 18, 1935   NT#:[id number]    Subjective: pt denies fevers or chills.  Denies any "shortness of breath".  Patient overnight uncomfortable with BiPAP.  Currently on 3 to 4 L of oxygen.  Denies any new onset of bleeding  Objective:  Vitals:   01/02/21 1934 01/02/21 1955  BP: 113/70   Pulse: 99   Resp: 17   Temp: 97.9 F (36.6 C)   SpO2: 96% 96%     Intake/Output Summary (Last 24 hours) at 01/02/2021 2104 Last data filed at 01/02/2021 2100 Gross per 24 hour  Intake 520 ml  Output 3825 ml  Net -3305 ml    Physical Exam Vitals and nursing note reviewed.  Constitutional:      Comments: Patient resting in the bed; alone.   HENT:     Head: Normocephalic and atraumatic.     Mouth/Throat:     Mouth: Mucous membranes are moist.     Pharynx: No oropharyngeal exudate.  Eyes:     Pupils: Pupils are equal, round, and reactive to light.  Cardiovascular:     Rate and Rhythm: Normal rate and regular rhythm.  Pulmonary:     Effort: No respiratory distress.     Breath sounds: No wheezing.     Comments: Decreased breath sounds bilaterally.  Abdominal:     General: Bowel sounds are normal. There is no distension.     Palpations: Abdomen is soft. There is no mass.     Tenderness: There is no abdominal tenderness. There is no guarding or rebound.  Musculoskeletal:        General: No tenderness. Normal range of motion.     Cervical back: Normal range of motion and neck supple.  Skin:    General: Skin is warm.     Comments: Chronic multiple bruises noted.  No bleeding noted.  Neurological:     Mental Status: He is alert and oriented to person, place, and time.  Psychiatric:        Mood and Affect: Affect normal.        Judgment: Judgment normal.    Labs:  Lab Results  Component Value Date   WBC 1.4 (LL) 01/02/2021   HGB 8.1 (L) 01/02/2021   HCT 23.7 (L) 01/02/2021   MCV 95.2 01/02/2021   PLT 28 (LL) 01/02/2021   NEUTROABS 0.3 (LL) 01/02/2021    Lab Results   Component Value Date   NA 136 01/02/2021   K 4.9 01/02/2021   CL 109 01/02/2021   CO2 23 01/02/2021    Studies:  DG Chest Port 1 View  Result Date: 01/01/2021 CLINICAL DATA:  Short of breath EXAM: PORTABLE CHEST 1 VIEW COMPARISON:  12/30/2020 FINDINGS: Prior CABG. Cardiac enlargement. Diffuse bilateral airspace disease has progressed in the interval. This is asymmetric and more prominent on the right. Minimal pleural effusion bilaterally. IMPRESSION: Diffuse bilateral airspace disease has progressed. Differential diagnosis includes pneumonia and edema. Electronically Signed   By: Franchot Gallo M.D.   On: 01/01/2021 14:55   ECHOCARDIOGRAM COMPLETE  Result Date: 01/02/2021    ECHOCARDIOGRAM REPORT   Patient Name:   Vincent Mason Date of Exam: 01/02/2021 Medical Rec #:  [id number]     Height:       70.0 in Accession #:    [id number]    Weight:       195.0 lb Date of Birth:  [birth year]     BSA:  2.065 m Patient Age:    60 years      BP:           130/77 mmHg Patient Gender: M             HR:           121 bpm. Exam Location:  [facility] Procedure: 2D Echo, Color Doppler and Cardiac Doppler Indications:     I50.31 congestive heart failure-Acute Diastolic  History:         Patient has prior history of Echocardiogram examinations. Prior                  CABG, COPD and CKD; Risk Factors:Hypertension, Dyslipidemia and                  Sleep Apnea.  Sonographer:     Charmayne Sheer RDCS (AE) Referring Phys:  [id number] Sharen Hones Diagnosing Phys: Yolonda Kida MD IMPRESSIONS  1. Borderline LVF Globally.  2. Left ventricular ejection fraction, by estimation, is 50 to 55%. The left ventricle has low normal function. The left ventricle has no regional wall motion abnormalities. The left ventricular internal cavity size was mildly dilated. Left ventricular diastolic parameters were normal.  3. Right ventricular systolic function is normal. The right ventricular size is normal.  4. Left atrial size was moderately  dilated.  5. Right atrial size was mildly dilated.  6. The mitral valve is normal in structure. Mild to moderate mitral valve regurgitation.  7. The aortic valve is normal in structure. Aortic valve regurgitation is not visualized. FINDINGS  Left Ventricle: Left ventricular ejection fraction, by estimation, is 50 to 55%. The left ventricle has low normal function. The left ventricle has no regional wall motion abnormalities. The left ventricular internal cavity size was mildly dilated. There is no left ventricular hypertrophy. Left ventricular diastolic parameters were normal. Right Ventricle: The right ventricular size is normal. No increase in right ventricular wall thickness. Right ventricular systolic function is normal. Left Atrium: Left atrial size was moderately dilated. Right Atrium: Right atrial size was mildly dilated. Pericardium: There is no evidence of pericardial effusion. Mitral Valve: The mitral valve is normal in structure. Mild to moderate mitral valve regurgitation. MV peak gradient, 7.2 mmHg. The mean mitral valve gradient is 4.0 mmHg. Tricuspid Valve: The tricuspid valve is normal in structure. Tricuspid valve regurgitation is trivial. Aortic Valve: The aortic valve is normal in structure. Aortic valve regurgitation is not visualized. Aortic valve mean gradient measures 5.0 mmHg. Aortic valve peak gradient measures 7.8 mmHg. Pulmonic Valve: The pulmonic valve was normal in structure. Pulmonic valve regurgitation is not visualized. Aorta: The ascending aorta was not well visualized. IAS/Shunts: No atrial level shunt detected by color flow Doppler. Additional Comments: Borderline LVF Globally.  LEFT VENTRICLE PLAX 2D LVIDd:         5.61 cm Diastology LVIDs:         4.23 cm LV e' medial:    9.57 cm/s LV PW:         1.42 cm LV E/e' medial:  11.7 LV IVS:        0.78 cm LV e' lateral:   12.30 cm/s                        LV E/e' lateral: 9.1  RIGHT VENTRICLE RV Basal diam:  4.00 cm RV S prime:      12.30 cm/s LEFT ATRIUM  Index       RIGHT ATRIUM           Index LA diam:        5.50 cm  2.66 cm/m  RA Area:     47.10 cm LA Vol (A2C):   75.0 ml  36.32 ml/m RA Volume:   201.00 ml 97.34 ml/m LA Vol (A4C):   100.0 ml 48.43 ml/m LA Biplane Vol: 94.7 ml  45.86 ml/m  AORTIC VALVE                    PULMONIC VALVE AV Vmax:           140.00 cm/s  PV Vmax:       0.90 m/s AV Vmean:          101.000 cm/s PV Vmean:      61.700 cm/s AV VTI:            0.229 m      PV VTI:        0.130 m AV Peak Grad:      7.8 mmHg     PV Peak grad:  3.3 mmHg AV Mean Grad:      5.0 mmHg     PV Mean grad:  2.0 mmHg LVOT Vmax:         92.00 cm/s LVOT Vmean:        60.700 cm/s LVOT VTI:          0.143 m LVOT/AV VTI ratio: 0.62 MITRAL VALVE                TRICUSPID VALVE MV Area (PHT): 5.32 cm     TR Peak grad:   38.4 mmHg MV Peak grad:  7.2 mmHg     TR Vmax:        310.00 cm/s MV Mean grad:  4.0 mmHg MV Vmax:       1.34 m/s     SHUNTS MV Vmean:      88.4 cm/s    Systemic VTI: 0.14 m MV Decel Time: 143 msec MV E velocity: 112.00 cm/s Dwayne D Callwood MD Electronically signed by Yolonda Kida MD Signature Date/Time: 01/02/2021/5:10:39 PM    Final     MDS (myelodysplastic syndrome), high grade [facility]) #85 year old male patient with high-grade MDS/severe pancytopenia; CKD stage III/IV-is currently admitted hospital for acute respiratory failure/multifocal pneumonia  # High-grade MDS; July 2022 bone marrow biopsy- Myelodysplastic syndrome with excess blasts [increased blasts -6%-8%;  with marked trilineage dysplasia.];R- IPSS:5.5- HIGH RISK-see below.   #Acute respiratory failure/multifocal pneumonia-in the context of severe neutropenia/immunosuppression -initially BiPAP overnight; currently on 3 L of oxygen saturation 92-94%. Currently on broad-spectrum antibiotics blood cultures negative.  Question atypical infection- fungal vs Viral vs TRALI [clinically less likely transfusion reaction].  Discussed with Dr. Steva Ready.  Defer to ID regarding management possible infection.  Patient s/p pulmonary evaluation-not a candidate for bronchoscopy.  # Anemia/thrombocytopenia/neutropenia-hemoglobin 8.3 on presentation s/p 2 units of PRBC transfusion; platelets 28 s/p 2 unit of platelet transfusion given hemoptysis.  Severe neutropenia ANC-400; hold off any growth factor.  Transfuse if hemoglobin 7-8; platelets less than 10,000 or clinical evidence bleeding noted.  #CODE STATUS full code-discussed on 08/10.  Discussed with  Dr.Zhang.   #Prognosis is still poor.  Addendum: Patient had episode of rectal bleeding over the GI been consulted.  Patient also s/p 1 unit platelets.   Cammie Sickle, MD 01/02/2021  9:04 PM

## 2021-01-02 NOTE — Progress Notes (Signed)
   01/02/21 1008  Assess: MEWS Score  BP 130/77  Pulse Rate (!) 115  Resp (!) 22  SpO2 98 %  O2 Device Nasal Cannula  O2 Flow Rate (L/min) 3 L/min  Assess: MEWS Score  MEWS Temp 0  MEWS Systolic 0  MEWS Pulse 2  MEWS RR 1  MEWS LOC 0  MEWS Score 3  MEWS Score Color Yellow  Assess: if the MEWS score is Yellow or Red  Were vital signs taken at a resting state? Yes  Focused Assessment No change from prior assessment  Does the patient meet 2 or more of the SIRS criteria? No  Does the patient have a confirmed or suspected source of infection? No  Provider and Rapid Response Notified? No  MEWS guidelines implemented *See Row Information* No, altered LOC is baseline  Assess: SIRS CRITERIA  SIRS Temperature  0  SIRS Pulse 1  SIRS Respirations  1  SIRS WBC 0  SIRS Score Sum  2

## 2021-01-02 NOTE — Progress Notes (Addendum)
Critical lab results  WBC 1.4 Platelets 23 Absolute neutrophils 0.3  Secure message to Provider, Dr. Roosevelt Locks  No new orders

## 2021-01-02 NOTE — Progress Notes (Signed)
PROGRESS NOTE    Vincent Mason  [id number] DOB: 03/28/1936 DOA: 12/30/2020 PCP: Birdie Sons, MD   Chief complaint.  Shortness of breath. Brief Narrative:  Vincent Mason is a 85 y.o. 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 fib not on oral anticoagulation, CKD 3B, chronic back pain, recent admission 7/23-7/26 for hemoptysis, who presented to [facility] ED due to worsening shortness of breath. Work-up showed multifocal pneumonia on CT chest, white cell count 1.7, platelet count 16, hemoglobin 7.2.   Assessment & Plan:   Active Problems:   Chronic atrial fibrillation (HCC)   Multifocal pneumonia   Neutropenic sepsis ([place])   Acute hypoxemic respiratory failure ([place])  #1.  Neutropenic sepsis secondary to multifocal pneumonia. Acute hypoxemic respiratory failure secondary to multifocal pneumonia Multifocal pneumonia. I reviewed the patient's CT images in July and this admission, patient has significant chronic glass opacities bilaterally. Discussed with pulmonology, ID and oncology, the nature of multifocal pneumonia is unknown. The decision is continue current antibiotics, obtain echocardiogram.  BNP is not elevated, will try to give a dose of IV Lasix to see if we can improve patient condition.  #2.  New diagnosis of a high-grade MDS.   Pancytopenia secondary to MDS with severe anemia, neutropenia and severe thrombocytopenia. Rectal bleeding. Patient is a followed by oncology, he developed an episode of rectal bleeding, fresh red, moderate in amount. Will consult GI. Displaced with count to 28, due to bleeding, I will give a unit of platelets. Patient's short-term prognosis is poor  #3.  Chronic atrial fibrillation with rapid ventricle response. Chronic systolic congestive heart failure. Coronary disease status post CABG and PCI. Elevated troponin secondary to sepsis. Patient is not chronically on  anticoagulation, patient currently does not have overt volume overload.  But will try to give 40 mg IV Lasix to see if that can improve patient hypoxemia. Patient heart rate still fast, will increase beta-blocker dose, blood pressure stable.  #4.  Chronic kidney disease stage III BP Renal function still stable, will continue to follow.  DVT prophylaxis: SCDs Code Status:  Family Communication:  Disposition Plan:    Status is: Inpatient  Remains inpatient appropriate because:IV treatments appropriate due to intensity of illness or inability to take PO and Inpatient level of care appropriate due to severity of illness  Dispo:  Patient From: Home  Planned Disposition: Home  Medically stable for discharge: No          I/O last 3 completed shifts: In: 1066 [P.O.:600; Blood:466] Out: 1875 [id number] No intake/output data recorded.     Consultants:  Oncology, pulmonology, GI, infectious disease  Procedures: None  Antimicrobials: Vancomycin.  Cefepime  Subjective: Patient still complaining of short of breath with exertion, still on 2.5 L oxygen.  He does not have a cough. He does not have any chest pain palpitation. Nurse just notified me that he had episode of rectal bleeding, fresh red blood, no clotting.  Roughly 50 to 100 mL.  Not associate with nausea vomiting abdominal pain. No fever chills  No dysuria or hematuria.  Objective: Vitals:   01/02/21 0712 01/02/21 0753 01/02/21 0823 01/02/21 1008  BP: (!) 128/56   130/77  Pulse: (!) 117   (!) 115  Resp: 18   (!) 22  Temp:   (!) 97.2 F (36.2 C)   TempSrc:   Axillary   SpO2: 100% 98%  98%  Weight:  Height:        Intake/Output Summary (Last 24 hours) at 01/02/2021 1031 Last data filed at 01/02/2021 0453 Gross per 24 hour  Intake 360 ml  Output 1175 ml  Net -815 ml   Filed Weights   12/30/20 1149  Weight: 88.5 kg    Examination:  General exam: Appears calm and comfortable  Respiratory system:  Clear to auscultation. Respiratory effort normal. Cardiovascular system: Irregular. No JVD, murmurs, rubs, gallops or clicks. No pedal edema. Gastrointestinal system: Abdomen is nondistended, soft and nontender. No organomegaly or masses felt. Normal bowel sounds heard. Central nervous system: Alert and oriented. No focal neurological deficits. Extremities: Symmetric 5 x 5 power. Skin: No rashes, lesions or ulcers Psychiatry: Judgement and insight appear normal. Mood & affect appropriate.     Data Reviewed: I have personally reviewed following labs and imaging studies  CBC: Recent Labs  Lab 12/27/20 0922 12/30/20 1200 12/30/20 2014 12/31/20 0731 01/01/21 0517 01/02/21 0540  WBC 2.0* 1.7*  --  1.1* 2.1* 1.4*  NEUTROABS 0.5* 0.4*  --  0.4* 0.6* 0.3*  HGB 7.1* 7.2* 8.0* 7.3* 8.4* 8.1*  HCT 21.7* 21.9* 24.2* 22.0* 24.2* 23.7*  MCV 103.8* 99.5  --  97.8 94.9 95.2  PLT 18* 16*  --  33* 28* 28*   Basic Metabolic Panel: Recent Labs  Lab 12/27/20 0922 12/30/20 1200 12/31/20 0731 01/01/21 0517 01/02/21 0540  NA 136 137 137 136 136  K 4.3 4.9 4.8 4.6 4.9  CL 102 108 106 106 109  CO2 24 23 21* 23 23  GLUCOSE 111* 100* 141* 110* 99  BUN 34* 46* 54* 50* 44*  CREATININE 1.74* 1.94* 1.84* 1.63* 1.52*  CALCIUM 8.6* 8.6* 8.1* 8.4* 8.4*  MG  --   --  2.4 2.6*  --   PHOS  --   --  4.2 3.0  --    GFR: Estimated Creatinine Clearance: 41.3 mL/min (A) (by C-G formula based on SCr of 1.52 mg/dL (H)). Liver Function Tests: Recent Labs  Lab 12/30/20 1200 12/31/20 0731  AST 23 28  ALT 14 16  ALKPHOS 46 39  BILITOT 1.4* 1.4*  PROT 6.6 6.0*  ALBUMIN 3.4* 3.1*   No results for input(s): LIPASE, AMYLASE in the last 168 hours. No results for input(s): AMMONIA in the last 168 hours. Coagulation Profile: No results for input(s): INR, PROTIME in the last 168 hours. Cardiac Enzymes: No results for input(s): CKTOTAL, CKMB, CKMBINDEX, TROPONINI in the last 168 hours. BNP (last 3  results) No results for input(s): PROBNP in the last 8760 hours. HbA1C: No results for input(s): HGBA1C in the last 72 hours. CBG: No results for input(s): GLUCAP in the last 168 hours. Lipid Profile: No results for input(s): CHOL, HDL, LDLCALC, TRIG, CHOLHDL, LDLDIRECT in the last 72 hours. Thyroid Function Tests: No results for input(s): TSH, T4TOTAL, FREET4, T3FREE, THYROIDAB in the last 72 hours. Anemia Panel: No results for input(s): VITAMINB12, FOLATE, FERRITIN, TIBC, IRON, RETICCTPCT in the last 72 hours. Sepsis Labs: Recent Labs  Lab 12/31/20 1950 01/01/21 0517  PROCALCITON 0.80 0.56    Recent Results (from the past 240 hour(s))  Resp Panel by RT-PCR (Flu A&B, Covid) Nasopharyngeal Swab     Status: None   Collection Time: 12/30/20 12:24 PM   Specimen: Nasopharyngeal Swab; Nasopharyngeal(NP) swabs in vial transport medium  Result Value Ref Range Status   SARS Coronavirus 2 by RT PCR NEGATIVE NEGATIVE Final    Comment: (NOTE) SARS-CoV-2 target nucleic acids  are NOT DETECTED.  The SARS-CoV-2 RNA is generally detectable in upper respiratory specimens during the acute phase of infection. The lowest concentration of SARS-CoV-2 viral copies this assay can detect is 138 copies/mL. A negative result does not preclude SARS-Cov-2 infection and should not be used as the sole basis for treatment or other patient management decisions. A negative result may occur with  improper specimen collection/handling, submission of specimen other than nasopharyngeal swab, presence of viral mutation(s) within the areas targeted by this assay, and inadequate number of viral copies(<138 copies/mL). A negative result must be combined with clinical observations, patient history, and epidemiological information. The expected result is Negative.  Fact Sheet for Patients:  EntrepreneurPulse.com.au  Fact Sheet for Healthcare Providers:   IncredibleEmployment.be  This test is no t yet approved or cleared by the [place] FDA and  has been authorized for detection and/or diagnosis of SARS-CoV-2 by FDA under an Emergency Use Authorization (EUA). This EUA will remain  in effect (meaning this test can be used) for the duration of the COVID-19 declaration under Section 564(b)(1) of the Act, 21 U.S.C.section 360bbb-3(b)(1), unless the authorization is terminated  or revoked sooner.       Influenza A by PCR NEGATIVE NEGATIVE Final   Influenza B by PCR NEGATIVE NEGATIVE Final    Comment: (NOTE) The Xpert Xpress SARS-CoV-2/FLU/RSV plus assay is intended as an aid in the diagnosis of influenza from Nasopharyngeal swab specimens and should not be used as a sole basis for treatment. Nasal washings and aspirates are unacceptable for Xpert Xpress SARS-CoV-2/FLU/RSV testing.  Fact Sheet for Patients: EntrepreneurPulse.com.au  Fact Sheet for Healthcare Providers: IncredibleEmployment.be  This test is not yet approved or cleared by the [place] FDA and has been authorized for detection and/or diagnosis of SARS-CoV-2 by FDA under an Emergency Use Authorization (EUA). This EUA will remain in effect (meaning this test can be used) for the duration of the COVID-19 declaration under Section 564(b)(1) of the Act, 21 U.S.C. section 360bbb-3(b)(1), unless the authorization is terminated or revoked.  Performed at [facility], [address] [place], [place] [address]   Blood culture (routine x 2)     Status: None (Preliminary result)   Collection Time: 12/30/20  1:59 PM   Specimen: BLOOD  Result Value Ref Range Status   Specimen Description BLOOD BLOOD RIGHT FOREARM  Final   Special Requests   Final    BOTTLES DRAWN AEROBIC AND ANAEROBIC Blood Culture adequate volume   Culture   Final    NO GROWTH 3 DAYS Performed at [facility], [address], [place], [place] [address]    Report Status PENDING  Incomplete  Blood culture (routine x 2)     Status: None (Preliminary result)   Collection Time: 12/30/20  1:59 PM   Specimen: BLOOD  Result Value Ref Range Status   Specimen Description BLOOD LEFT ANTECUBITAL  Final   Special Requests   Final    BOTTLES DRAWN AEROBIC AND ANAEROBIC Blood Culture adequate volume   Culture   Final    NO GROWTH 3 DAYS Performed at [facility], [address], [place], [place] [address]    Report Status PENDING  Incomplete  MRSA Next Gen by PCR, Nasal     Status: None   Collection Time: 01/01/21  3:02 PM   Specimen: Nasal Mucosa; Nasal Swab  Result Value Ref Range Status   MRSA by PCR Next Gen NOT DETECTED NOT DETECTED Final  Comment: (NOTE) The GeneXpert MRSA Assay (FDA approved for NASAL specimens only), is one component of a comprehensive MRSA colonization surveillance program. It is not intended to diagnose MRSA infection nor to guide or monitor treatment for MRSA infections. Test performance is not FDA approved in patients less than 40 years old. Performed at [facility], [place], [place] [address]   Respiratory (~20 pathogens) panel by PCR     Status: None   Collection Time: 01/01/21  9:10 PM  Result Value Ref Range Status   Adenovirus NOT DETECTED NOT DETECTED Final   Coronavirus 229E NOT DETECTED NOT DETECTED Final    Comment: (NOTE) The Coronavirus on the Respiratory Panel, DOES NOT test for the novel  Coronavirus (2019 nCoV)    Coronavirus HKU1 NOT DETECTED NOT DETECTED Final   Coronavirus NL63 NOT DETECTED NOT DETECTED Final   Coronavirus OC43 NOT DETECTED NOT DETECTED Final   Metapneumovirus NOT DETECTED NOT DETECTED Final   Rhinovirus / Enterovirus NOT DETECTED NOT DETECTED Final   Influenza A NOT DETECTED NOT DETECTED Final   Influenza B NOT DETECTED NOT DETECTED Final   Parainfluenza Virus 1 NOT DETECTED NOT DETECTED Final    Parainfluenza Virus 2 NOT DETECTED NOT DETECTED Final   Parainfluenza Virus 3 NOT DETECTED NOT DETECTED Final   Parainfluenza Virus 4 NOT DETECTED NOT DETECTED Final   Respiratory Syncytial Virus NOT DETECTED NOT DETECTED Final   Bordetella pertussis NOT DETECTED NOT DETECTED Final   Bordetella Parapertussis NOT DETECTED NOT DETECTED Final   Chlamydophila pneumoniae NOT DETECTED NOT DETECTED Final   Mycoplasma pneumoniae NOT DETECTED NOT DETECTED Final    Comment: Performed at [facility], [place]. [address], [place],  [address]         Radiology Studies: DG Chest Port 1 View  Result Date: 01/01/2021 CLINICAL DATA:  Short of breath EXAM: PORTABLE CHEST 1 VIEW COMPARISON:  12/30/2020 FINDINGS: Prior CABG. Cardiac enlargement. Diffuse bilateral airspace disease has progressed in the interval. This is asymmetric and more prominent on the right. Minimal pleural effusion bilaterally. IMPRESSION: Diffuse bilateral airspace disease has progressed. Differential diagnosis includes pneumonia and edema. Electronically Signed   By: Franchot Gallo M.D.   On: 01/01/2021 14:55        Scheduled Meds:  sodium chloride   Intravenous Once   allopurinol  200 mg Oral Daily   cholecalciferol  5,000 Units Oral Daily   ipratropium-albuterol  3 mL Nebulization BID   lactulose  20 g Oral Once   polyethylene glycol  17 g Oral Daily   rosuvastatin  10 mg Oral Daily   vitamin B-12  1,000 mcg Oral Daily   Continuous Infusions:  azithromycin 500 mg (01/01/21 1745)   ceFEPime (MAXIPIME) IV 2 g (01/02/21 1016)     LOS: 3 days    Time spent: 32 minutes    Sharen Hones, MD [facility]   To contact the attending provider between 7A-7P or the covering provider during after hours 7P-7A, please log into the web site [email or web address] and access using universal [place] password for that web site. If you do not have the password, please call the hospital operator.  01/02/2021, 10:31 AM

## 2021-01-02 NOTE — Progress Notes (Signed)
*  PRELIMINARY RESULTS* Echocardiogram 2D Echocardiogram has been performed.  Vincent Mason 01/02/2021, 11:53 AM

## 2021-01-02 NOTE — Consult Note (Addendum)
Vonda Antigua, MD [address] [place], [place], [place], [address] [address], [place], [place], [place], [address] Phone: [phone]  Fax: [phone]  Consultation  Referring Provider:     Dr. Roosevelt Locks Primary Care Physician:  Birdie Sons, MD Reason for Consultation:     Rectal bleeding  Date of Admission:  12/30/2020 Date of Consultation:  01/02/2021         HPI:   Vincent Mason is a 85 y.o. male with high-grade dysplastic syndrome, severe pancytopenia admitted with multifocal pneumonia, with GI and consulted for rectal bleeding.  Patient reports having a very hard stool yesterday and states he strained with it.  He did not notice the rectal bleeding but was told that he had blood from his rectum by nursing staff.  I talked with the patient's nurse, and she states she noticed a spot of red blood on his bedsheet around 10 AM this morning.  No clots.  No melena.  It appeared dark red.  Patient denies any abdominal pain, nausea or vomiting.  No prior history of GI bleeds.  Patient has had prior colonoscopies.  Full colonoscopy report not available but there is an encounter placed under the procedures tab from March 2014 but states that patient had internal hemorrhoids at that time with no polyps.  CBC shows chronic anemia at baseline.  INR was normal December 14, 2020 Total bilirubin mildly elevated to 1.4, but normal alk phos  Chest x-ray shows bilateral airspace disease  Past Medical History:  Diagnosis Date  . Acute encephalopathy 09/03/2015  . Atrial fibrillation and flutter [facility]) 2013   Dr Nehemiah Massed at [facility]  on [address].   . C. difficile colitis 09/12/2015  . COPD (chronic obstructive pulmonary disease) ([place])   . HCAP (healthcare-associated pneumonia) 09/12/2015  . Hyperlipidemia   . Hypertension   . Kidney disease, chronic, stage III (moderate, EGFR 30-59 ml/min) ([place])    ARF 2013  . OSA (obstructive sleep apnea)    non-compliant with CPAP.   Marland Kitchen Severe sepsis with  septic shock ([place]) 09/12/2015    Past Surgical History:  Procedure Laterality Date  . CATARACT EXTRACTION    . CORONARY ARTERY BYPASS GRAFT  10/1997  . EYE SURGERY    . history of cervical discectomy  2009   C5-C6  . Hyperplastic colon polyp  02/2002   Sigmoid polyps  . KYPHOPLASTY N/A 02/06/2020   Procedure: T12 & L3 Kyphoplasty;  Surgeon: Hessie Knows, MD;  Location: [facility] ORS;  Service: Orthopedics;  Laterality: N/A;    Prior to Admission medications   Medication Sig Start Date End Date Taking? Authorizing Provider  allopurinol (ZYLOPRIM) 100 MG tablet Take 2 tablets (200 mg total) by mouth daily. 02/15/20   Birdie Sons, MD  Cholecalciferol (VITAMIN D-3) 125 MCG (5000 UT) TABS Take 5,000 Units by mouth daily.    [provider]  gabapentin (NEURONTIN) 300 MG capsule Take 1 capsule (300 mg total) by mouth at bedtime. 12/17/20   Domenic Polite, MD  metoprolol succinate (TOPROL-XL) 25 MG 24 hr tablet TAKE 1/2 A TABLET BY MOUTH DAILY Patient taking differently: Take 12.5 mg by mouth daily. 01/04/20   Birdie Sons, MD  oxyCODONE (ROXICODONE) 15 MG immediate release tablet TAKE 1 TABLET BY MOUTH EVERY 4 HOURS AS NEEDED Patient taking differently: Take 15 mg by mouth every 4 (four) hours as needed for pain. 12/12/20   Birdie Sons, MD  polyethylene glycol [facility] Floria Raveling) packet  Take 17 g by mouth daily. Patient not taking: Reported on 12/27/2020 12/08/16   Gladstone Lighter, MD  PROCTOZONE-HC 2.5 % rectal cream PLACE 1 APPLICATION RECTALLY TWICE DAILYAS DIRECTED Patient taking differently: Place 1 application rectally 2 (two) times daily as needed (discomfort/hemorrhoids). 10/16/19   Chrismon, Vickki Muff, PA-C  rosuvastatin (CRESTOR) 10 MG tablet TAKE 1 TABLET BY MOUTH ONCE DAILY 11/05/20   Birdie Sons, MD  spironolactone (ALDACTONE) 25 MG tablet Take 25 mg by mouth daily.    [provider]  torsemide (DEMADEX) 20 MG tablet Take 0.5 tablets (10 mg total) by  mouth daily. Take one daily as needed for excessive swelling 12/17/20   Domenic Polite, MD  vitamin B-12 (CYANOCOBALAMIN) 1000 MCG tablet Take 1 tablet (1,000 mcg total) by mouth daily. 12/17/20 02/15/21  Domenic Polite, MD    Family History  Problem Relation Age of Onset  . Hypertension Mother   . Hypertension Father      Social History   Tobacco Use  . Smoking status: Former  . Smokeless tobacco: Never  . Tobacco comments:    Quit in [place] Use  . Vaping Use: Never used  Substance Use Topics  . Alcohol use: No  . Drug use: No    Allergies as of 12/30/2020  . (No Known Allergies)    Review of Systems:    All systems reviewed and negative except where noted in HPI.   Physical Exam:  Constitutional: General:   Alert,  Well-developed, well-nourished, pleasant and cooperative in NAD BP 130/77   Pulse (!) 108   Temp (!) 97.2 F (36.2 C) (Axillary)   Resp 18   Ht 5' 10" (1.778 m)   Wt 88.5 kg   SpO2 98%   BMI 27.98 kg/m   Eyes:  Sclera clear, no icterus.   Conjunctiva pink. PERRLA  Ears:  No scars, lesions or masses, Normal auditory acuity. Nose:  No deformity, discharge, or lesions. Mouth:  No deformity or lesions, oropharynx pink & moist.  Neck:  Supple; no masses or thyromegaly.  Respiratory: Normal respiratory effort, Normal percussion  Gastrointestinal:  Normal bowel sounds.  Soft, non-tender and non-distended without masses, hepatosplenomegaly or hernias noted.  No guarding or rebound tenderness.     Rectal exam: 1 large external hemorrhoid present, tender to palpation, nonbleeding.  Digital rectal exam with no mass, blood, or stool in the rectal vault  Cardiac: No clubbing or edema.  No cyanosis. Normal posterior tibial pedal pulses noted.  Lymphatic:  No significant cervical or axillary adenopathy.  Psych:  Alert and cooperative. Normal mood and affect.  Musculoskeletal:  Normal gait. Head normocephalic, atraumatic. Symmetrical without gross  deformities. 5/5 Upper and Lower extremity strength bilaterally.  Skin: Warm. Intact without significant lesions or rashes. No jaundice.  Neurologic:  Face symmetrical, tongue midline, Normal sensation to touch;  grossly normal neurologically.  Psych:  Alert and oriented x3, Alert and cooperative. Normal mood and affect.   LAB RESULTS: Recent Labs    12/31/20 0731 01/01/21 0517 01/02/21 0540  WBC 1.1* 2.1* 1.4*  HGB 7.3* 8.4* 8.1*  HCT 22.0* 24.2* 23.7*  PLT 33* 28* 28*   BMET Recent Labs    12/31/20 0731 01/01/21 0517 01/02/21 0540  NA 137 136 136  K 4.8 4.6 4.9  CL 106 106 109  CO2 21* 23 23  GLUCOSE 141* 110* 99  BUN 54* 50* 44*  CREATININE 1.84* 1.63* 1.52*  CALCIUM 8.1* 8.4* 8.4*   LFT  Recent Labs    12/31/20 0731  PROT 6.0*  ALBUMIN 3.1*  AST 28  ALT 16  ALKPHOS 39  BILITOT 1.4*   PT/INR INR 1.2 December 14, 2020  STUDIES: DG Chest Port 1 View  Result Date: 01/01/2021 CLINICAL DATA:  Short of breath EXAM: PORTABLE CHEST 1 VIEW COMPARISON:  12/30/2020 FINDINGS: Prior CABG. Cardiac enlargement. Diffuse bilateral airspace disease has progressed in the interval. This is asymmetric and more prominent on the right. Minimal pleural effusion bilaterally. IMPRESSION: Diffuse bilateral airspace disease has progressed. Differential diagnosis includes pneumonia and edema. Electronically Signed   By: Franchot Gallo M.D.   On: 01/01/2021 14:55      Impression / Plan:   Vincent Mason is a 85 y.o. y/o male with severe myelodysplastic syndrome, severe pancytopenia, admitted with multifocal pneumonia (as seen on chest x-ray above), with GI consulted for rectal bleeding  Patient's hemoglobin has been ranging between 7-8 since July 2022 and is at that baseline on this admission  The likely source of the blood noted on the bed sheets by nursing staff this morning is his large external hemorrhoid on rectal exam today.  It is not currently bleeding.  However, if this rebleeds,  would recommend surgery evaluation.  Also consider RBC scan at the time of recurrent bleeding to rule out any other sources  Given his acute presentation and acute medical issues including multifocal pneumonia, pancytopenia, and the likely source of his bleeding being from the large external hemorrhoid that was from straining yesterday, would not recommend colonoscopy at this time as risks of procedure would outweigh benefits  However, if patient has recurrent bleeding and source is not clear on RBC scan, colonoscopy can be considered  Would recommend high-fiber diet and avoid straining or constipation.  Anusol rectal cream was prescribed on admission, which is appropriate  Lactulose was ordered and given by primary team today.  MiraLAX daily is also on his medication list  Would recommend monitoring bowel movements and if patient has constipation or straining, can titrate MiraLAX up to twice a day  Continue serial CBCs and transfuse as needed  Mild elevation total bilirubin with normal alkaline phosphatase not indicative of biliary obstruction, in the absence of abdominal pain as well.  Can trend and fractionate in the future if still elevated.  Please page GI on call with any signs of active GI bleeding   Thank you for involving me in the care of this patient.      LOS: 3 days   Virgel Manifold, MD  01/02/2021, 11:22 AM

## 2021-01-03 ENCOUNTER — Inpatient Hospital Stay: Payer: Medicare Other

## 2021-01-03 DIAGNOSIS — K5900 Constipation, unspecified: Secondary | ICD-10-CM | POA: Diagnosis not present

## 2021-01-03 DIAGNOSIS — K625 Hemorrhage of anus and rectum: Secondary | ICD-10-CM | POA: Diagnosis not present

## 2021-01-03 LAB — CBC WITH DIFFERENTIAL/PLATELET
Abs Immature Granulocytes: 0.07 10*3/uL (ref 0.00–0.07)
Basophils Absolute: 0 10*3/uL (ref 0.0–0.1)
Basophils Relative: 1 %
Eosinophils Absolute: 0 10*3/uL (ref 0.0–0.5)
Eosinophils Relative: 0 %
HCT: 25 % — ABNORMAL LOW (ref 39.0–52.0)
Hemoglobin: 8.5 g/dL — ABNORMAL LOW (ref 13.0–17.0)
Immature Granulocytes: 4 %
Lymphocytes Relative: 44 %
Lymphs Abs: 0.9 10*3/uL (ref 0.7–4.0)
MCH: 32.8 pg (ref 26.0–34.0)
MCHC: 34 g/dL (ref 30.0–36.0)
MCV: 96.5 fL (ref 80.0–100.0)
Monocytes Absolute: 0.5 10*3/uL (ref 0.1–1.0)
Monocytes Relative: 26 %
Neutro Abs: 0.5 10*3/uL — ABNORMAL LOW (ref 1.7–7.7)
Neutrophils Relative %: 25 %
Platelets: 32 10*3/uL — ABNORMAL LOW (ref 150–400)
RBC: 2.59 MIL/uL — ABNORMAL LOW (ref 4.22–5.81)
RDW: 19.7 % — ABNORMAL HIGH (ref 11.5–15.5)
Smear Review: NORMAL
WBC: 1.9 10*3/uL — ABNORMAL LOW (ref 4.0–10.5)
nRBC: 2.1 % — ABNORMAL HIGH (ref 0.0–0.2)

## 2021-01-03 LAB — BASIC METABOLIC PANEL
Anion gap: 5 (ref 5–15)
BUN: 40 mg/dL — ABNORMAL HIGH (ref 8–23)
CO2: 25 mmol/L (ref 22–32)
Calcium: 8.7 mg/dL — ABNORMAL LOW (ref 8.9–10.3)
Chloride: 106 mmol/L (ref 98–111)
Creatinine, Ser: 1.47 mg/dL — ABNORMAL HIGH (ref 0.61–1.24)
GFR, Estimated: 47 mL/min — ABNORMAL LOW (ref >60)
Glucose, Bld: 102 mg/dL — ABNORMAL HIGH (ref 70–99)
Potassium: 4.5 mmol/L (ref 3.5–5.1)
Sodium: 136 mmol/L (ref 135–145)

## 2021-01-03 LAB — FUNGITELL, SERUM: Fungitell Result: <31 pg/mL (ref <80)

## 2021-01-03 LAB — BPAM PLATELET PHERESIS
Blood Product Expiration Date: 202208132359
ISSUE DATE / TIME: 202208111120
Unit Type and Rh: 6200

## 2021-01-03 LAB — ASPERGILLUS ANTIGEN, BAL/SERUM: Aspergillus Ag, BAL/Serum: 0.06 Index (ref 0.00–0.49)

## 2021-01-03 LAB — ANCA TITERS
Atypical P-ANCA titer: <1:20 {titer}
C-ANCA: <1:20 {titer}
P-ANCA: <1:20 {titer}

## 2021-01-03 LAB — PREPARE PLATELET PHERESIS: Unit division: 0

## 2021-01-03 LAB — MAGNESIUM: Magnesium: 2.3 mg/dL (ref 1.7–2.4)

## 2021-01-03 LAB — LEGIONELLA PNEUMOPHILA SEROGP 1 UR AG: L. pneumophila Serogp 1 Ur Ag: NEGATIVE

## 2021-01-03 MED ORDER — FUROSEMIDE 10 MG/ML IJ SOLN
40.0000 mg | Freq: Once | INTRAMUSCULAR | Status: AC
  Administered 2021-01-03: 40 mg via INTRAVENOUS
  Filled 2021-01-03: qty 4

## 2021-01-03 MED ORDER — SALINE SPRAY 0.65 % NA SOLN
1.0000 | NASAL | Status: DC | PRN
  Administered 2021-01-03: 1 via NASAL
  Filled 2021-01-03: qty 44

## 2021-01-03 NOTE — Progress Notes (Signed)
PROGRESS NOTE    Vincent Mason  [id number] DOB: 1935-08-29 DOA: 12/30/2020 PCP: Birdie Sons, MD   Chief complaint.  Shortness of breath. Brief Narrative:  Vincent Mason is a 85 y.o. 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 fib not on oral anticoagulation, CKD 3B, chronic back pain, recent admission 7/23-7/26 for hemoptysis, who presented to [facility] ED due to worsening shortness of breath. Work-up showed multifocal pneumonia on CT chest, white cell count 1.7, platelet count 16, hemoglobin 7.2.   Assessment & Plan:   Active Problems:   Chronic atrial fibrillation (HCC)   Multifocal pneumonia   Neutropenic sepsis ([place])   Acute hypoxemic respiratory failure ([place])  #1.  Neutropenic sepsis secondary to multifocal pneumonia. Acute hypoxemic respiratory failure secondary to multifocal pneumonia Multifocal pneumonia. Patient condition is relatively stable, currently on 2 L oxygen at daytime, he is requiring BiPAP at nighttime. He probably has significant obstructive sleep apnea.  Appreciated ID consult, antibiotics were simplified to cefepime only.  #2.  New diagnosis of a high-grade MDS.   Pancytopenia secondary to MDS with severe anemia, neutropenia and severe thrombocytopenia. Rectal bleeding. Has been seen by GI, rectal bleeding appears to be secondary to hemorrhoids.  He received platelet transfusion, bleeding seem to be better. Patient has a poor prognosis per oncology.  3.  Chronic atrial fibrillation with rapid ventricle response. Chronic systolic congestive heart failure. Coronary disease status post CABG and PCI. Elevated troponin secondary to sepsis. Conditions are stable.  Patient may be stable enough to be discharged tomorrow.  Discussed with Education officer, museum, patient will need a BiPAP to discharge, he has been using it in the hospital.  He probably has severe sleep apnea, with recent lung injury,  he is unlikely to come off night BiPAP.  I also discussed with equipment company, obtain bedside spirometry to help qualify for it. Also obtain PT/OT, to determine if the patient is a candidate for nursing home  DVT prophylaxis: SCDs Code Status: full Family Communication: daughter updated Disposition Plan:    Status is: Inpatient  Remains inpatient appropriate because:IV treatments appropriate due to intensity of illness or inability to take PO and Inpatient level of care appropriate due to severity of illness  Dispo:  Patient From: Home  Planned Disposition: Home with Health Care Svc  Medically stable for discharge: No          I/O last 3 completed shifts: In: 620 [Blood:520; IV Piggyback:100] Out: [id number] [Urine:4125] Total I/O In: 360 [P.O.:360] Out: -      Consultants:  Oncology, id  Procedures: none  Antimicrobials: cefepime  Subjective: Patient still on 2 L oxygen, he is short of breath with exertion.  Cough, nonproductive. He does not have any abdominal pain or nausea vomiting, no longer has any rectal bleeding. No dysuria hematuria  No fever or chills. No chest pain palpitation.  Objective: Vitals:   01/03/21 0319 01/03/21 0757 01/03/21 1127 01/03/21 1200  BP: 115/68 104/63 (!) 88/52 (!) 108/58  Pulse: 80 84 82   Resp: '17 19 17   '$ Temp: 98.9 F (37.2 C) 97.8 F (36.6 C) 98.2 F (36.8 C)   TempSrc:      SpO2: 100% 99% 96% 92%  Weight:      Height:        Intake/Output Summary (Last 24 hours) at 01/03/2021 1310 Last data filed at 01/03/2021 1200 Gross per 24 hour  Intake 980 ml  Output  2800 ml  Net -1820 ml   Filed Weights   12/30/20 1149  Weight: 88.5 kg    Examination:  General exam: Appears calm and comfortable  Respiratory system: Clear to auscultation. Respiratory effort normal. Cardiovascular system: S1 & S2 heard, RRR. No JVD, murmurs, rubs, gallops or clicks. No pedal edema. Gastrointestinal system: Abdomen is nondistended, soft  and nontender. No organomegaly or masses felt. Normal bowel sounds heard. Central nervous system: Alert and oriented x3. No focal neurological deficits. Extremities: Symmetric 5 x 5 power. Skin: No rashes, lesions or ulcers Psychiatry:  Mood & affect appropriate.     Data Reviewed: I have personally reviewed following labs and imaging studies  CBC: Recent Labs  Lab 12/30/20 1200 12/30/20 2014 12/31/20 0731 01/01/21 0517 01/02/21 0540 01/03/21 0520  WBC 1.7*  --  1.1* 2.1* 1.4* 1.9*  NEUTROABS 0.4*  --  0.4* 0.6* 0.3* 0.5*  HGB 7.2* 8.0* 7.3* 8.4* 8.1* 8.5*  HCT 21.9* 24.2* 22.0* 24.2* 23.7* 25.0*  MCV 99.5  --  97.8 94.9 95.2 96.5  PLT 16*  --  33* 28* 28* 32*   Basic Metabolic Panel: Recent Labs  Lab 12/30/20 1200 12/31/20 0731 01/01/21 0517 01/02/21 0540 01/03/21 0520  NA 137 137 136 136 136  K 4.9 4.8 4.6 4.9 4.5  CL 108 106 106 109 106  CO2 23 21* '23 23 25  '$ GLUCOSE 100* 141* 110* 99 102*  BUN 46* 54* 50* 44* 40*  CREATININE 1.94* 1.84* 1.63* 1.52* 1.47*  CALCIUM 8.6* 8.1* 8.4* 8.4* 8.7*  MG  --  2.4 2.6*  --  2.3  PHOS  --  4.2 3.0  --   --    GFR: Estimated Creatinine Clearance: 42.7 mL/min (A) (by C-G formula based on SCr of 1.47 mg/dL (H)). Liver Function Tests: Recent Labs  Lab 12/30/20 1200 12/31/20 0731  AST 23 28  ALT 14 16  ALKPHOS 46 39  BILITOT 1.4* 1.4*  PROT 6.6 6.0*  ALBUMIN 3.4* 3.1*   No results for input(s): LIPASE, AMYLASE in the last 168 hours. No results for input(s): AMMONIA in the last 168 hours. Coagulation Profile: No results for input(s): INR, PROTIME in the last 168 hours. Cardiac Enzymes: No results for input(s): CKTOTAL, CKMB, CKMBINDEX, TROPONINI in the last 168 hours. BNP (last 3 results) No results for input(s): PROBNP in the last 8760 hours. HbA1C: No results for input(s): HGBA1C in the last 72 hours. CBG: No results for input(s): GLUCAP in the last 168 hours. Lipid Profile: No results for input(s): CHOL, HDL,  LDLCALC, TRIG, CHOLHDL, LDLDIRECT in the last 72 hours. Thyroid Function Tests: No results for input(s): TSH, T4TOTAL, FREET4, T3FREE, THYROIDAB in the last 72 hours. Anemia Panel: No results for input(s): VITAMINB12, FOLATE, FERRITIN, TIBC, IRON, RETICCTPCT in the last 72 hours. Sepsis Labs: Recent Labs  Lab 12/31/20 1950 01/01/21 0517  PROCALCITON 0.80 0.56    Recent Results (from the past 240 hour(s))  Resp Panel by RT-PCR (Flu A&B, Covid) Nasopharyngeal Swab     Status: None   Collection Time: 12/30/20 12:24 PM   Specimen: Nasopharyngeal Swab; Nasopharyngeal(NP) swabs in vial transport medium  Result Value Ref Range Status   SARS Coronavirus 2 by RT PCR NEGATIVE NEGATIVE Final    Comment: (NOTE) SARS-CoV-2 target nucleic acids are NOT DETECTED.  The SARS-CoV-2 RNA is generally detectable in upper respiratory specimens during the acute phase of infection. The lowest concentration of SARS-CoV-2 viral copies this assay can detect is 138  copies/mL. A negative result does not preclude SARS-Cov-2 infection and should not be used as the sole basis for treatment or other patient management decisions. A negative result may occur with  improper specimen collection/handling, submission of specimen other than nasopharyngeal swab, presence of viral mutation(s) within the areas targeted by this assay, and inadequate number of viral copies(<138 copies/mL). A negative result must be combined with clinical observations, patient history, and epidemiological information. The expected result is Negative.  Fact Sheet for Patients:  EntrepreneurPulse.com.au  Fact Sheet for Healthcare Providers:  IncredibleEmployment.be  This test is no t yet approved or cleared by the [place] FDA and  has been authorized for detection and/or diagnosis of SARS-CoV-2 by FDA under an Emergency Use Authorization (EUA). This EUA will remain  in effect (meaning this test  can be used) for the duration of the COVID-19 declaration under Section 564(b)(1) of the Act, 21 U.S.C.section 360bbb-3(b)(1), unless the authorization is terminated  or revoked sooner.       Influenza A by PCR NEGATIVE NEGATIVE Final   Influenza B by PCR NEGATIVE NEGATIVE Final    Comment: (NOTE) The Xpert Xpress SARS-CoV-2/FLU/RSV plus assay is intended as an aid in the diagnosis of influenza from Nasopharyngeal swab specimens and should not be used as a sole basis for treatment. Nasal washings and aspirates are unacceptable for Xpert Xpress SARS-CoV-2/FLU/RSV testing.  Fact Sheet for Patients: EntrepreneurPulse.com.au  Fact Sheet for Healthcare Providers: IncredibleEmployment.be  This test is not yet approved or cleared by the [place] FDA and has been authorized for detection and/or diagnosis of SARS-CoV-2 by FDA under an Emergency Use Authorization (EUA). This EUA will remain in effect (meaning this test can be used) for the duration of the COVID-19 declaration under Section 564(b)(1) of the Act, 21 U.S.C. section 360bbb-3(b)(1), unless the authorization is terminated or revoked.  Performed at [facility], [address] [place], [place] [address]   Blood culture (routine x 2)     Status: None (Preliminary result)   Collection Time: 12/30/20  1:59 PM   Specimen: BLOOD  Result Value Ref Range Status   Specimen Description BLOOD BLOOD RIGHT FOREARM  Final   Special Requests   Final    BOTTLES DRAWN AEROBIC AND ANAEROBIC Blood Culture adequate volume   Culture   Final    NO GROWTH 4 DAYS Performed at [facility], [address] [place], [place] [address]    Report Status PENDING  Incomplete  Blood culture (routine x 2)     Status: None (Preliminary result)   Collection Time: 12/30/20  1:59 PM   Specimen: BLOOD  Result Value Ref Range Status   Specimen Description BLOOD LEFT ANTECUBITAL  Final   Special  Requests   Final    BOTTLES DRAWN AEROBIC AND ANAEROBIC Blood Culture adequate volume   Culture   Final    NO GROWTH 4 DAYS Performed at [facility], [address] [place], [place] [address]    Report Status PENDING  Incomplete  MRSA Next Gen by PCR, Nasal     Status: None   Collection Time: 01/01/21  3:02 PM   Specimen: Nasal Mucosa; Nasal Swab  Result Value Ref Range Status   MRSA by PCR Next Gen NOT DETECTED NOT DETECTED Final    Comment: (NOTE) The GeneXpert MRSA Assay (FDA approved for NASAL specimens only), is one component of a comprehensive MRSA colonization surveillance program. It is not intended to diagnose MRSA infection nor to  guide or monitor treatment for MRSA infections. Test performance is not FDA approved in patients less than 66 years old. Performed at [facility], [place], [place] [address]   Respiratory (~20 pathogens) panel by PCR     Status: None   Collection Time: 01/01/21  9:10 PM  Result Value Ref Range Status   Adenovirus NOT DETECTED NOT DETECTED Final   Coronavirus 229E NOT DETECTED NOT DETECTED Final    Comment: (NOTE) The Coronavirus on the Respiratory Panel, DOES NOT test for the novel  Coronavirus (2019 nCoV)    Coronavirus HKU1 NOT DETECTED NOT DETECTED Final   Coronavirus NL63 NOT DETECTED NOT DETECTED Final   Coronavirus OC43 NOT DETECTED NOT DETECTED Final   Metapneumovirus NOT DETECTED NOT DETECTED Final   Rhinovirus / Enterovirus NOT DETECTED NOT DETECTED Final   Influenza A NOT DETECTED NOT DETECTED Final   Influenza B NOT DETECTED NOT DETECTED Final   Parainfluenza Virus 1 NOT DETECTED NOT DETECTED Final   Parainfluenza Virus 2 NOT DETECTED NOT DETECTED Final   Parainfluenza Virus 3 NOT DETECTED NOT DETECTED Final   Parainfluenza Virus 4 NOT DETECTED NOT DETECTED Final   Respiratory Syncytial Virus NOT DETECTED NOT DETECTED Final   Bordetella pertussis NOT DETECTED NOT DETECTED Final    Bordetella Parapertussis NOT DETECTED NOT DETECTED Final   Chlamydophila pneumoniae NOT DETECTED NOT DETECTED Final   Mycoplasma pneumoniae NOT DETECTED NOT DETECTED Final    Comment: Performed at [facility], [place], [place], [place] [address]         Radiology Studies: DG Chest Port 1 View  Result Date: 01/03/2021 CLINICAL DATA:  Dyspnea. EXAM: PORTABLE CHEST 1 VIEW COMPARISON:  January 01, 2021. FINDINGS: Stable cardiomegaly. Status post coronary bypass graft. Stable bilateral patchy airspace opacities are noted most consistent with multifocal pneumonia. No pneumothorax or pleural effusion is noted. Bony thorax is unremarkable. IMPRESSION: Stable bilateral patchy airspace opacities are noted most consistent with multifocal pneumonia. Electronically Signed   By: Marijo Conception M.D.   On: 01/03/2021 11:38   ECHOCARDIOGRAM COMPLETE  Result Date: 01/02/2021    ECHOCARDIOGRAM REPORT   Patient Name:   IZAAK ARMS Date of Exam: 01/02/2021 Medical Rec #:  [id number]     Height:       70.0 in Accession #:    [id number]    Weight:       195.0 lb Date of Birth:  December 25, 1935     BSA:          2.065 m Patient Age:    20 years      BP:           130/77 mmHg Patient Gender: M             HR:           121 bpm. Exam Location:  [facility] Procedure: 2D Echo, Color Doppler and Cardiac Doppler Indications:     I50.31 congestive heart failure-Acute Diastolic  History:         Patient has prior history of Echocardiogram examinations. Prior                  CABG, COPD and CKD; Risk Factors:Hypertension, Dyslipidemia and                  Sleep Apnea.  Sonographer:     Charmayne Sheer RDCS (AE) Referring Phys:  [id number] Sharen Hones Diagnosing Phys: Yolonda Kida MD  IMPRESSIONS  1. Borderline LVF Globally.  2. Left ventricular ejection fraction, by estimation, is 50 to 55%. The left ventricle has low normal function. The left ventricle has no regional wall motion abnormalities. The left ventricular internal cavity  size was mildly dilated. Left ventricular diastolic parameters were normal.  3. Right ventricular systolic function is normal. The right ventricular size is normal.  4. Left atrial size was moderately dilated.  5. Right atrial size was mildly dilated.  6. The mitral valve is normal in structure. Mild to moderate mitral valve regurgitation.  7. The aortic valve is normal in structure. Aortic valve regurgitation is not visualized. FINDINGS  Left Ventricle: Left ventricular ejection fraction, by estimation, is 50 to 55%. The left ventricle has low normal function. The left ventricle has no regional wall motion abnormalities. The left ventricular internal cavity size was mildly dilated. There is no left ventricular hypertrophy. Left ventricular diastolic parameters were normal. Right Ventricle: The right ventricular size is normal. No increase in right ventricular wall thickness. Right ventricular systolic function is normal. Left Atrium: Left atrial size was moderately dilated. Right Atrium: Right atrial size was mildly dilated. Pericardium: There is no evidence of pericardial effusion. Mitral Valve: The mitral valve is normal in structure. Mild to moderate mitral valve regurgitation. MV peak gradient, 7.2 mmHg. The mean mitral valve gradient is 4.0 mmHg. Tricuspid Valve: The tricuspid valve is normal in structure. Tricuspid valve regurgitation is trivial. Aortic Valve: The aortic valve is normal in structure. Aortic valve regurgitation is not visualized. Aortic valve mean gradient measures 5.0 mmHg. Aortic valve peak gradient measures 7.8 mmHg. Pulmonic Valve: The pulmonic valve was normal in structure. Pulmonic valve regurgitation is not visualized. Aorta: The ascending aorta was not well visualized. IAS/Shunts: No atrial level shunt detected by color flow Doppler. Additional Comments: Borderline LVF Globally.  LEFT VENTRICLE PLAX 2D LVIDd:         5.61 cm Diastology LVIDs:         4.23 cm LV e' medial:    9.57 cm/s  LV PW:         1.42 cm LV E/e' medial:  11.7 LV IVS:        0.78 cm LV e' lateral:   12.30 cm/s                        LV E/e' lateral: 9.1  RIGHT VENTRICLE RV Basal diam:  4.00 cm RV S prime:     12.30 cm/s LEFT ATRIUM              Index       RIGHT ATRIUM           Index LA diam:        5.50 cm  2.66 cm/m  RA Area:     47.10 cm LA Vol (A2C):   75.0 ml  36.32 ml/m RA Volume:   201.00 ml 97.34 ml/m LA Vol (A4C):   100.0 ml 48.43 ml/m LA Biplane Vol: 94.7 ml  45.86 ml/m  AORTIC VALVE                    PULMONIC VALVE AV Vmax:           140.00 cm/s  PV Vmax:       0.90 m/s AV Vmean:          101.000 cm/s PV Vmean:      61.700 cm/s AV VTI:  0.229 m      PV VTI:        0.130 m AV Peak Grad:      7.8 mmHg     PV Peak grad:  3.3 mmHg AV Mean Grad:      5.0 mmHg     PV Mean grad:  2.0 mmHg LVOT Vmax:         92.00 cm/s LVOT Vmean:        60.700 cm/s LVOT VTI:          0.143 m LVOT/AV VTI ratio: 0.62 MITRAL VALVE                TRICUSPID VALVE MV Area (PHT): 5.32 cm     TR Peak grad:   38.4 mmHg MV Peak grad:  7.2 mmHg     TR Vmax:        310.00 cm/s MV Mean grad:  4.0 mmHg MV Vmax:       1.34 m/s     SHUNTS MV Vmean:      88.4 cm/s    Systemic VTI: 0.14 m MV Decel Time: 143 msec MV E velocity: 112.00 cm/s Dwayne D Callwood MD Electronically signed by Yolonda Kida MD Signature Date/Time: 01/02/2021/5:10:39 PM    Final         Scheduled Meds:  sodium chloride   Intravenous Once   allopurinol  200 mg Oral Daily   cholecalciferol  5,000 Units Oral Daily   furosemide  40 mg Intravenous Once   lactulose  20 g Oral Once   metoprolol tartrate  25 mg Oral BID   polyethylene glycol  17 g Oral Daily   rosuvastatin  10 mg Oral Daily   vitamin B-12  1,000 mcg Oral Daily   Continuous Infusions:  azithromycin 500 mg (01/02/21 1654)   ceFEPime (MAXIPIME) IV 2 g (01/03/21 0831)     LOS: 4 days    Time spent: 32 minutes, more than 50% time involving direct patient care.    Sharen Hones,  MD [facility]   To contact the attending provider between 7A-7P or the covering provider during after hours 7P-7A, please log into the web site [email or web address] and access using universal [place] password for that web site. If you do not have the password, please call the hospital operator.  01/03/2021, 1:10 PM

## 2021-01-03 NOTE — Progress Notes (Signed)
Vincent Antigua, MD [address] [place], [place], [place], [address] [address], [place], [place], [place], [address] Phone: [phone]  Fax: [phone]   Subjective: Patient denies any further episodes of rectal bleeding.  Denies any abdominal pain.  Has not had a bowel movement.  Was given MiraLAX.  No nausea or vomiting.  Reports good appetite.   Objective: Exam: Vital signs in last 24 hours: Vitals:   01/03/21 0757 01/03/21 1127 01/03/21 1200 01/03/21 1413  BP: 104/63 (!) 88/52 (!) 108/58 107/62  Pulse: 84 82  94  Resp: '19 17  17  '$ Temp: 97.8 F (36.6 C) 98.2 F (36.8 C)  97.8 F (36.6 C)  TempSrc:      SpO2: 99% 96% 92% 96%  Weight:      Height:       Weight change:   Intake/Output Summary (Last 24 hours) at 01/03/2021 1529 Last data filed at 01/03/2021 1200 Gross per 24 hour  Intake 460 ml  Output 1300 ml  Net -840 ml    Constitutional: General:   Alert,  Well-developed, well-nourished, pleasant and cooperative in NAD BP 107/62 (BP Location: Left Arm)   Pulse 94   Temp 97.8 F (36.6 C)   Resp 17   Ht '5\' 10"'$  (1.778 m)   Wt 88.5 kg   SpO2 96%   BMI 27.98 kg/m   Eyes:  Sclera clear, no icterus.   Conjunctiva pink.   Ears:  No scars, lesions or masses, Normal auditory acuity. Nose:  No deformity, discharge, or lesions. Mouth:  No deformity or lesions, oropharynx pink & moist.  Neck:  Supple; no masses, trachea midline  Respiratory: Normal respiratory effort  Gastrointestinal:  Soft, non-tender and non-distended without masses, hepatosplenomegaly or hernias noted.  No guarding or rebound tenderness.     Cardiac: No clubbing or edema.  No cyanosis. Normal posterior tibial pedal pulses noted.  Lymphatic:  No significant cervical adenopathy.  Psych:  Alert and cooperative. Normal mood and affect.  Musculoskeletal:  Head normocephalic, atraumatic. 5/5 Lower extremity strength bilaterally.  Skin: Warm. Intact without significant lesions  or rashes. No jaundice.  Neurologic:  Face symmetrical, tongue midline, Normal sensation to touch  Psych:  Alert and oriented x3, Alert and cooperative. Normal mood and affect.   Lab Results: Lab Results  Component Value Date   WBC 1.9 (L) 01/03/2021   HGB 8.5 (L) 01/03/2021   HCT 25.0 (L) 01/03/2021   MCV 96.5 01/03/2021   PLT 32 (L) 01/03/2021   Micro Results: Recent Results (from the past 240 hour(s))  Resp Panel by RT-PCR (Flu A&B, Covid) Nasopharyngeal Swab     Status: None   Collection Time: 12/30/20 12:24 PM   Specimen: Nasopharyngeal Swab; Nasopharyngeal(NP) swabs in vial transport medium  Result Value Ref Range Status   SARS Coronavirus 2 by RT PCR NEGATIVE NEGATIVE Final    Comment: (NOTE) SARS-CoV-2 target nucleic acids are NOT DETECTED.  The SARS-CoV-2 RNA is generally detectable in upper respiratory specimens during the acute phase of infection. The lowest concentration of SARS-CoV-2 viral copies this assay can detect is 138 copies/mL. A negative result does not preclude SARS-Cov-2 infection and should not be used as the sole basis for treatment or other patient management decisions. A negative result may occur with  improper specimen collection/handling, submission of specimen other than nasopharyngeal swab, presence of viral mutation(s) within the areas targeted by this assay, and inadequate number of viral copies(<138 copies/mL). A negative result must be  combined with clinical observations, patient history, and epidemiological information. The expected result is Negative.  Fact Sheet for Patients:  EntrepreneurPulse.com.au  Fact Sheet for Healthcare Providers:  IncredibleEmployment.be  This test is no t yet approved or cleared by the [place] FDA and  has been authorized for detection and/or diagnosis of SARS-CoV-2 by FDA under an Emergency Use Authorization (EUA). This EUA will remain  in effect (meaning this  test can be used) for the duration of the COVID-19 declaration under Section 564(b)(1) of the Act, 21 U.S.C.section 360bbb-3(b)(1), unless the authorization is terminated  or revoked sooner.       Influenza A by PCR NEGATIVE NEGATIVE Final   Influenza B by PCR NEGATIVE NEGATIVE Final    Comment: (NOTE) The Xpert Xpress SARS-CoV-2/FLU/RSV plus assay is intended as an aid in the diagnosis of influenza from Nasopharyngeal swab specimens and should not be used as a sole basis for treatment. Nasal washings and aspirates are unacceptable for Xpert Xpress SARS-CoV-2/FLU/RSV testing.  Fact Sheet for Patients: EntrepreneurPulse.com.au  Fact Sheet for Healthcare Providers: IncredibleEmployment.be  This test is not yet approved or cleared by the [place] FDA and has been authorized for detection and/or diagnosis of SARS-CoV-2 by FDA under an Emergency Use Authorization (EUA). This EUA will remain in effect (meaning this test can be used) for the duration of the COVID-19 declaration under Section 564(b)(1) of the Act, 21 U.S.C. section 360bbb-3(b)(1), unless the authorization is terminated or revoked.  Performed at [facility], [address] [place], [place] [address]   Blood culture (routine x 2)     Status: None (Preliminary result)   Collection Time: 12/30/20  1:59 PM   Specimen: BLOOD  Result Value Ref Range Status   Specimen Description BLOOD BLOOD RIGHT FOREARM  Final   Special Requests   Final    BOTTLES DRAWN AEROBIC AND ANAEROBIC Blood Culture adequate volume   Culture   Final    NO GROWTH 4 DAYS Performed at [facility], [address] [place], [place] [address]    Report Status PENDING  Incomplete  Blood culture (routine x 2)     Status: None (Preliminary result)   Collection Time: 12/30/20  1:59 PM   Specimen: BLOOD  Result Value Ref Range Status   Specimen Description BLOOD LEFT ANTECUBITAL  Final    Special Requests   Final    BOTTLES DRAWN AEROBIC AND ANAEROBIC Blood Culture adequate volume   Culture   Final    NO GROWTH 4 DAYS Performed at [facility], [address] [place], [place] [address]    Report Status PENDING  Incomplete  MRSA Next Gen by PCR, Nasal     Status: None   Collection Time: 01/01/21  3:02 PM   Specimen: Nasal Mucosa; Nasal Swab  Result Value Ref Range Status   MRSA by PCR Next Gen NOT DETECTED NOT DETECTED Final    Comment: (NOTE) The GeneXpert MRSA Assay (FDA approved for NASAL specimens only), is one component of a comprehensive MRSA colonization surveillance program. It is not intended to diagnose MRSA infection nor to guide or monitor treatment for MRSA infections. Test performance is not FDA approved in patients less than 75 years old. Performed at [facility], [place],  [address]   Respiratory (~20 pathogens) panel by PCR     Status: None   Collection Time: 01/01/21  9:10 PM  Result Value Ref Range Status   Adenovirus NOT DETECTED  NOT DETECTED Final   Coronavirus 229E NOT DETECTED NOT DETECTED Final    Comment: (NOTE) The Coronavirus on the Respiratory Panel, DOES NOT test for the novel  Coronavirus (2019 nCoV)    Coronavirus HKU1 NOT DETECTED NOT DETECTED Final   Coronavirus NL63 NOT DETECTED NOT DETECTED Final   Coronavirus OC43 NOT DETECTED NOT DETECTED Final   Metapneumovirus NOT DETECTED NOT DETECTED Final   Rhinovirus / Enterovirus NOT DETECTED NOT DETECTED Final   Influenza A NOT DETECTED NOT DETECTED Final   Influenza B NOT DETECTED NOT DETECTED Final   Parainfluenza Virus 1 NOT DETECTED NOT DETECTED Final   Parainfluenza Virus 2 NOT DETECTED NOT DETECTED Final   Parainfluenza Virus 3 NOT DETECTED NOT DETECTED Final   Parainfluenza Virus 4 NOT DETECTED NOT DETECTED Final   Respiratory Syncytial Virus NOT DETECTED NOT DETECTED Final   Bordetella pertussis NOT DETECTED NOT DETECTED Final    Bordetella Parapertussis NOT DETECTED NOT DETECTED Final   Chlamydophila pneumoniae NOT DETECTED NOT DETECTED Final   Mycoplasma pneumoniae NOT DETECTED NOT DETECTED Final    Comment: Performed at [facility], [place] [address] [place], [place] [address]   Studies/Results: DG Chest Port 1 View  Result Date: 01/03/2021 CLINICAL DATA:  Dyspnea. EXAM: PORTABLE CHEST 1 VIEW COMPARISON:  January 01, 2021. FINDINGS: Stable cardiomegaly. Status post coronary bypass graft. Stable bilateral patchy airspace opacities are noted most consistent with multifocal pneumonia. No pneumothorax or pleural effusion is noted. Bony thorax is unremarkable. IMPRESSION: Stable bilateral patchy airspace opacities are noted most consistent with multifocal pneumonia. Electronically Signed   By: Marijo Conception M.D.   On: 01/03/2021 11:38   ECHOCARDIOGRAM COMPLETE  Result Date: 01/02/2021    ECHOCARDIOGRAM REPORT   Patient Name:   ELISEO VANDEPUTTE Date of Exam: 01/02/2021 Medical Rec #:  [id number]     Height:       70.0 in Accession #:    [id number]    Weight:       195.0 lb Date of Birth:  05/09/1936     BSA:          2.065 m Patient Age:    85 years      BP:           130/77 mmHg Patient Gender: M             HR:           121 bpm. Exam Location:  [facility] Procedure: 2D Echo, Color Doppler and Cardiac Doppler Indications:     I50.31 congestive heart failure-Acute Diastolic  History:         Patient has prior history of Echocardiogram examinations. Prior                  CABG, COPD and CKD; Risk Factors:Hypertension, Dyslipidemia and                  Sleep Apnea.  Sonographer:     Charmayne Sheer RDCS (AE) Referring Phys:  [id number] Sharen Hones Diagnosing Phys: Yolonda Kida MD IMPRESSIONS  1. Borderline LVF Globally.  2. Left ventricular ejection fraction, by estimation, is 50 to 55%. The left ventricle has low normal function. The left ventricle has no regional wall motion abnormalities. The left ventricular internal cavity size was  mildly dilated. Left ventricular diastolic parameters were normal.  3. Right ventricular systolic function is normal. The right ventricular size is normal.  4. Left atrial size was moderately dilated.  5. Right atrial size was mildly dilated.  6. The mitral valve is normal in structure. Mild to moderate mitral valve regurgitation.  7. The aortic valve is normal in structure. Aortic valve regurgitation is not visualized. FINDINGS  Left Ventricle: Left ventricular ejection fraction, by estimation, is 50 to 55%. The left ventricle has low normal function. The left ventricle has no regional wall motion abnormalities. The left ventricular internal cavity size was mildly dilated. There is no left ventricular hypertrophy. Left ventricular diastolic parameters were normal. Right Ventricle: The right ventricular size is normal. No increase in right ventricular wall thickness. Right ventricular systolic function is normal. Left Atrium: Left atrial size was moderately dilated. Right Atrium: Right atrial size was mildly dilated. Pericardium: There is no evidence of pericardial effusion. Mitral Valve: The mitral valve is normal in structure. Mild to moderate mitral valve regurgitation. MV peak gradient, 7.2 mmHg. The mean mitral valve gradient is 4.0 mmHg. Tricuspid Valve: The tricuspid valve is normal in structure. Tricuspid valve regurgitation is trivial. Aortic Valve: The aortic valve is normal in structure. Aortic valve regurgitation is not visualized. Aortic valve mean gradient measures 5.0 mmHg. Aortic valve peak gradient measures 7.8 mmHg. Pulmonic Valve: The pulmonic valve was normal in structure. Pulmonic valve regurgitation is not visualized. Aorta: The ascending aorta was not well visualized. IAS/Shunts: No atrial level shunt detected by color flow Doppler. Additional Comments: Borderline LVF Globally.  LEFT VENTRICLE PLAX 2D LVIDd:         5.61 cm Diastology LVIDs:         4.23 cm LV e' medial:    9.57 cm/s LV PW:          1.42 cm LV E/e' medial:  11.7 LV IVS:        0.78 cm LV e' lateral:   12.30 cm/s                        LV E/e' lateral: 9.1  RIGHT VENTRICLE RV Basal diam:  4.00 cm RV S prime:     12.30 cm/s LEFT ATRIUM              Index       RIGHT ATRIUM           Index LA diam:        5.50 cm  2.66 cm/m  RA Area:     47.10 cm LA Vol (A2C):   75.0 ml  36.32 ml/m RA Volume:   201.00 ml 97.34 ml/m LA Vol (A4C):   100.0 ml 48.43 ml/m LA Biplane Vol: 94.7 ml  45.86 ml/m  AORTIC VALVE                    PULMONIC VALVE AV Vmax:           140.00 cm/s  PV Vmax:       0.90 m/s AV Vmean:          101.000 cm/s PV Vmean:      61.700 cm/s AV VTI:            0.229 m      PV VTI:        0.130 m AV Peak Grad:      7.8 mmHg     PV Peak grad:  3.3 mmHg AV Mean Grad:      5.0 mmHg     PV Mean grad:  2.0 mmHg LVOT Vmax:  92.00 cm/s LVOT Vmean:        60.700 cm/s LVOT VTI:          0.143 m LVOT/AV VTI ratio: 0.62 MITRAL VALVE                TRICUSPID VALVE MV Area (PHT): 5.32 cm     TR Peak grad:   38.4 mmHg MV Peak grad:  7.2 mmHg     TR Vmax:        310.00 cm/s MV Mean grad:  4.0 mmHg MV Vmax:       1.34 m/s     SHUNTS MV Vmean:      88.4 cm/s    Systemic VTI: 0.14 m MV Decel Time: 143 msec MV E velocity: 112.00 cm/s Yolonda Kida MD Electronically signed by Yolonda Kida MD Signature Date/Time: 01/02/2021/5:10:39 PM    Final    Medications:  Scheduled Meds:  sodium chloride   Intravenous Once   allopurinol  200 mg Oral Daily   cholecalciferol  5,000 Units Oral Daily   lactulose  20 g Oral Once   metoprolol tartrate  25 mg Oral BID   polyethylene glycol  17 g Oral Daily   rosuvastatin  10 mg Oral Daily   vitamin B-12  1,000 mcg Oral Daily   Continuous Infusions:  azithromycin 500 mg (01/02/21 1654)   PRN Meds:.acetaminophen, hydrocortisone, ipratropium-albuterol, melatonin, metoprolol tartrate, ondansetron (ZOFRAN) IV, oxyCODONE, senna-docusate, sodium chloride   Assessment: Bright red blood per  rectum Constipation  Plan: Patient has not had any further episodes of rectal bleeding and no further drop in hemoglobin over the last 2 days  He has not had a bowel movement yesterday or today and has been receiving MiraLAX  If no further bowel movement today, would recommend increasing MiraLAX to twice daily dosing to avoid straining with bowel movements, that likely led to his initial episode of small amount of blood seen on his sheets, and an enlarged external hemorrhoid.  Given the absence of any further bright red blood per rectum, would not recommend colonoscopy at this time, in the setting of pancytopenia.  If rectal bleeding reoccurs, please page GI on-call, and patient will likely need surgery consult at that time as well given and large external hemorrhoid thought to be the source of his symptoms yesterday  GI service will sign off at this time, please page with any questions or concerns   LOS: 4 days   Vincent Antigua, MD 01/03/2021, 3:29 PM

## 2021-01-03 NOTE — Care Management Important Message (Signed)
Important Message  Patient Details  Name: Vincent Mason MRN: [id number] Date of Birth: 10/10/1935   Medicare Important Message Given:  Yes     [name] 01/03/2021, 1:28 PM

## 2021-01-03 NOTE — Progress Notes (Signed)
Vincent Mason   DOB:[birth year]   KG#:[id number]    Subjective: pt denies fevers or chills.  Denies any "shortness of breath".  Patient overnight uncomfortable with BiPAP.  Currently on 3 to 4 L of oxygen.  Denies any new onset of bleeding. Complains of pain in bilateral lower extremities.    Objective:  Vitals:   01/03/21 1413 01/03/21 2059  BP: 107/62 115/72  Pulse: 94 89  Resp: 17 18  Temp: 97.8 F (36.6 C) 97.8 F (36.6 C)  SpO2: 96% 98%     Intake/Output Summary (Last 24 hours) at 01/03/2021 2309 Last data filed at 01/03/2021 2205 Gross per 24 hour  Intake 460 ml  Output 1050 ml  Net -590 ml    Physical Exam Vitals and nursing note reviewed.  Constitutional:      Comments: Patient resting in the bed; alone.   HENT:     Head: Normocephalic and atraumatic.     Mouth/Throat:     Mouth: Mucous membranes are moist.     Pharynx: No oropharyngeal exudate.  Eyes:     Pupils: Pupils are equal, round, and reactive to light.  Cardiovascular:     Rate and Rhythm: Normal rate and regular rhythm.  Pulmonary:     Effort: No respiratory distress.     Breath sounds: No wheezing.     Comments: Decreased breath sounds bilaterally.  Abdominal:     General: Bowel sounds are normal. There is no distension.     Palpations: Abdomen is soft. There is no mass.     Tenderness: There is no abdominal tenderness. There is no guarding or rebound.  Musculoskeletal:        General: No tenderness. Normal range of motion.     Cervical back: Normal range of motion and neck supple.  Skin:    General: Skin is warm.     Comments: Chronic multiple bruises noted.  No bleeding noted.  Neurological:     Mental Status: He is alert and oriented to person, place, and time.  Psychiatric:        Mood and Affect: Affect normal.        Judgment: Judgment normal.    Labs:  Lab Results  Component Value Date   WBC 1.9 (L) 01/03/2021   HGB 8.5 (L) 01/03/2021   HCT 25.0 (L) 01/03/2021   MCV 96.5 01/03/2021    PLT 32 (L) 01/03/2021   NEUTROABS 0.5 (L) 01/03/2021    Lab Results  Component Value Date   NA 136 01/03/2021   K 4.5 01/03/2021   CL 106 01/03/2021   CO2 25 01/03/2021    Studies:  DG Chest Port 1 View  Result Date: 01/03/2021 CLINICAL DATA:  Dyspnea. EXAM: PORTABLE CHEST 1 VIEW COMPARISON:  January 01, 2021. FINDINGS: Stable cardiomegaly. Status post coronary bypass graft. Stable bilateral patchy airspace opacities are noted most consistent with multifocal pneumonia. No pneumothorax or pleural effusion is noted. Bony thorax is unremarkable. IMPRESSION: Stable bilateral patchy airspace opacities are noted most consistent with multifocal pneumonia. Electronically Signed   By: Marijo Conception M.D.   On: 01/03/2021 11:38   ECHOCARDIOGRAM COMPLETE  Result Date: 01/02/2021    ECHOCARDIOGRAM REPORT   Patient Name:   Vincent Mason Date of Exam: 01/02/2021 Medical Rec #:  [id number]     Height:       70.0 in Accession #:    [id number]    Weight:       195.0 lb Date  of Birth:  04-11-1936     BSA:          2.065 m Patient Age:    17 years      BP:           130/77 mmHg Patient Gender: M             HR:           121 bpm. Exam Location:  [facility] Procedure: 2D Echo, Color Doppler and Cardiac Doppler Indications:     I50.31 congestive heart failure-Acute Diastolic  History:         Patient has prior history of Echocardiogram examinations. Prior                  CABG, COPD and CKD; Risk Factors:Hypertension, Dyslipidemia and                  Sleep Apnea.  Sonographer:     Charmayne Sheer RDCS (AE) Referring Phys:  [id number] Sharen Hones Diagnosing Phys: Yolonda Kida MD IMPRESSIONS  1. Borderline LVF Globally.  2. Left ventricular ejection fraction, by estimation, is 50 to 55%. The left ventricle has low normal function. The left ventricle has no regional wall motion abnormalities. The left ventricular internal cavity size was mildly dilated. Left ventricular diastolic parameters were normal.  3. Right ventricular  systolic function is normal. The right ventricular size is normal.  4. Left atrial size was moderately dilated.  5. Right atrial size was mildly dilated.  6. The mitral valve is normal in structure. Mild to moderate mitral valve regurgitation.  7. The aortic valve is normal in structure. Aortic valve regurgitation is not visualized. FINDINGS  Left Ventricle: Left ventricular ejection fraction, by estimation, is 50 to 55%. The left ventricle has low normal function. The left ventricle has no regional wall motion abnormalities. The left ventricular internal cavity size was mildly dilated. There is no left ventricular hypertrophy. Left ventricular diastolic parameters were normal. Right Ventricle: The right ventricular size is normal. No increase in right ventricular wall thickness. Right ventricular systolic function is normal. Left Atrium: Left atrial size was moderately dilated. Right Atrium: Right atrial size was mildly dilated. Pericardium: There is no evidence of pericardial effusion. Mitral Valve: The mitral valve is normal in structure. Mild to moderate mitral valve regurgitation. MV peak gradient, 7.2 mmHg. The mean mitral valve gradient is 4.0 mmHg. Tricuspid Valve: The tricuspid valve is normal in structure. Tricuspid valve regurgitation is trivial. Aortic Valve: The aortic valve is normal in structure. Aortic valve regurgitation is not visualized. Aortic valve mean gradient measures 5.0 mmHg. Aortic valve peak gradient measures 7.8 mmHg. Pulmonic Valve: The pulmonic valve was normal in structure. Pulmonic valve regurgitation is not visualized. Aorta: The ascending aorta was not well visualized. IAS/Shunts: No atrial level shunt detected by color flow Doppler. Additional Comments: Borderline LVF Globally.  LEFT VENTRICLE PLAX 2D LVIDd:         5.61 cm Diastology LVIDs:         4.23 cm LV e' medial:    9.57 cm/s LV PW:         1.42 cm LV E/e' medial:  11.7 LV IVS:        0.78 cm LV e' lateral:   12.30 cm/s                         LV E/e' lateral: 9.1  RIGHT VENTRICLE RV Basal diam:  4.00 cm RV S prime:     12.30 cm/s LEFT ATRIUM              Index       RIGHT ATRIUM           Index LA diam:        5.50 cm  2.66 cm/m  RA Area:     47.10 cm LA Vol (A2C):   75.0 ml  36.32 ml/m RA Volume:   201.00 ml 97.34 ml/m LA Vol (A4C):   100.0 ml 48.43 ml/m LA Biplane Vol: 94.7 ml  45.86 ml/m  AORTIC VALVE                    PULMONIC VALVE AV Vmax:           140.00 cm/s  PV Vmax:       0.90 m/s AV Vmean:          101.000 cm/s PV Vmean:      61.700 cm/s AV VTI:            0.229 m      PV VTI:        0.130 m AV Peak Grad:      7.8 mmHg     PV Peak grad:  3.3 mmHg AV Mean Grad:      5.0 mmHg     PV Mean grad:  2.0 mmHg LVOT Vmax:         92.00 cm/s LVOT Vmean:        60.700 cm/s LVOT VTI:          0.143 m LVOT/AV VTI ratio: 0.62 MITRAL VALVE                TRICUSPID VALVE MV Area (PHT): 5.32 cm     TR Peak grad:   38.4 mmHg MV Peak grad:  7.2 mmHg     TR Vmax:        310.00 cm/s MV Mean grad:  4.0 mmHg MV Vmax:       1.34 m/s     SHUNTS MV Vmean:      88.4 cm/s    Systemic VTI: 0.14 m MV Decel Time: 143 msec MV E velocity: 112.00 cm/s Dwayne D Callwood MD Electronically signed by Yolonda Kida MD Signature Date/Time: 01/02/2021/5:10:39 PM    Final     MDS (myelodysplastic syndrome) [facility]) #85 year old male patient with high-grade MDS/severe pancytopenia; CKD stage III/IV-is currently admitted hospital for acute respiratory failure/multifocal pneumonia  # High-grade MDS; July 2022 bone marrow biopsy- Myelodysplastic syndrome with excess blasts [increased blasts -6%-8%;  with marked trilineage dysplasia.];R- IPSS:5.5- HIGH RISK-see below.   #Acute respiratory failure/multifocal pneumonia-in the context of severe neutropenia/immunosuppression- currently on 3 L of oxygen saturation 92-94%. Currently on broad-spectrum antibiotics blood cultures negative.  Question atypical infection- fungal vs Viral vs TRALI [clinically less  likely transfusion reaction].  Defer to ID/pulmonary for further management.   # Anemia/thrombocytopenia/neutropenia-hemoglobin 8.3 on presentation s/p 2 units of PRBC transfusion; platelets 32 s/p 3 unit of platelet transfusion given hemoptysis.  Severe neutropenia ANC-500; hold off any growth factor.  Transfuse if hemoglobin 7-8; platelets less than 10,000 or clinical evidence bleeding noted.  # Rectal bleeding: likely secondary to hemorrhoids-s/p GI evaluation.  #CODE STATUS full code.   #Prognosis is still poor.  I called patient's son to update; unable to reach/leave voicemail-mailbox full.     Cammie Sickle, MD 01/03/2021  11:09 PM

## 2021-01-03 NOTE — TOC Progression Note (Signed)
Transition of Care [facility]) - Progression Note    Patient Details  Name: AVION PAVIS MRN: [id number] Date of Birth: March 15, 1936  [facility]) CM/SW Contact  Beverly Sessions, RN Phone Number: 01/03/2021, 3:53 PM  Clinical Narrative:    Notified by MD that patient will need bipap/trilogy for discharge Zack with [employer] has reviewed case and discussed requirements MD to order bedside spirometry  Per [facility] Josem Kaufmann can take up to 14 days for trilogy approval  Anticipated that patient will need O2 at discharge Requested for bedside RN to check qualifying sats  MD has now ordered PT/OT eval stating that patient may require SNF placement    Expected Discharge Plan: Home/Self Care Barriers to Discharge: Continued Medical Work up  Expected Discharge Plan and Services Expected Discharge Plan: Home/Self Care       Living arrangements for the past 2 months: Single Family Home                                       Social Determinants of Health (SDOH) Interventions    Readmission Risk Interventions Readmission Risk Prevention Plan 01/01/2021  Transportation Screening Complete  Medication Review ([place]) Complete  PCP or Specialist appointment within 3-5 days of discharge Complete  HRI or [place] Complete  SW Recovery Care/Counseling Consult Complete  [place] Not Applicable  Some recent data might be hidden

## 2021-01-03 NOTE — Evaluation (Signed)
Physical Therapy Evaluation Patient Details Name: Vincent Mason MRN: [id number] DOB: 1935-07-03 Today's Date: 01/03/2021   History of Present Illness  Pt is an 85 y/o M admitted on 12/30/20 with c/o worsening SOB. Work-up revealed multifocal PNA on chest CT. PMH: newly diagnosed high-grade MD (12/20/20), CAD s/p CABG, PCI, chronic a-fib not on oral anticoagulation, CKD 3B, chronic back pain, recent admission 7/23-7/26 for hemoptysis  Clinical Impression  Pt seen for PT evaluation with wife & grandkids present for session. Pt very verbose, difficult to redirect, and resistant to education/instruction during session. Unable to determine if pt's cognition is at baseline or not (pt seems a little HOH, oriented to self, location & year only) & family reports pt's mood is baseline. Pt is able to complete supine>sit with supervision but endorses dizziness - returns to bed to void with urinal & unable to check BP. After voiding, pt reluctant to use RW for transfers/gait, ultimately transferred to recliner with min assist without AD with encouragement. BP 115/68 mmHg (MAP 77) in LUE; pt would benefit from orthostatic vital assessment. Pt on 2L/min via nasal cannula throughout session with SpO2 reading 77% after stand pivot transfer but with cold fingers & RN in room to assess with improvement to 90%. Educated pt & family on recommendation of STR upon d/c to maximize independence with functional mobility & reduce fall risk prior to return home.     Follow Up Recommendations SNF    Equipment Recommendations  None recommended by PT (TBD in next venue)    Recommendations for Other Services       Precautions / Restrictions Precautions Precautions: Fall Restrictions Weight Bearing Restrictions: No      Mobility  Bed Mobility Overal bed mobility: Needs Assistance Bed Mobility: Supine to Sit;Sit to Supine     Supine to sit: Supervision;HOB elevated (use of bed rails) Sit to supine: Modified independent  (Device/Increase time)        Transfers Overall transfer level: Needs assistance Equipment used: None Transfers: Sit to/from [facility] Sit to Stand: Min assist Stand pivot transfers: Min assist          Ambulation/Gait                Stairs            Wheelchair Mobility    Modified Rankin (Stroke Patients Only)       Balance Overall balance assessment: Needs assistance Sitting-balance support: Feet supported Sitting balance-Leahy Scale: Good     Standing balance support: During functional activity;Bilateral upper extremity supported Standing balance-Leahy Scale: Fair                               Pertinent Vitals/Pain Pain Assessment: No/denies pain    Home Living Family/patient expects to be discharged to:: Private residence Living Arrangements: Spouse/significant other Available Help at Discharge: Family Type of Home: House Home Access: Level entry     Home Layout: One level Home Equipment: Environmental consultant - 4 wheels      Prior Function           Comments: ambulatory with rollator     Hand Dominance        Extremity/Trunk Assessment   Upper Extremity Assessment Upper Extremity Assessment: Generalized weakness    Lower Extremity Assessment Lower Extremity Assessment: Generalized weakness       Communication   Communication:  (unsure if pt is HOH or not)  Cognition  Arousal/Alertness: Awake/alert Behavior During Therapy:  (irritable) Overall Cognitive Status: Difficult to assess                                 General Comments: Pt oriented to location & year but unable to recall correct month. Pt very verbose, complaining about various things throughout session, not accepting of education from PT.      General Comments General comments (skin integrity, edema, etc.): Pt reports need to use urinal & strongly requests to return to lying down & using it while supine; pt with continent  void. Declines offer to stand to void.    Exercises     Assessment/Plan    PT Assessment Patient needs continued PT services  PT Problem List Decreased strength;Decreased mobility;Decreased safety awareness;Decreased balance;Decreased activity tolerance;Decreased knowledge of precautions;Decreased cognition;Cardiopulmonary status limiting activity;Decreased knowledge of use of DME       PT Treatment Interventions DME instruction;Therapeutic activities;Modalities;Cognitive remediation;Gait training;Therapeutic exercise;Patient/family education;Balance training;Neuromuscular re-education;Manual techniques;Functional mobility training;Wheelchair mobility training    PT Goals (Current goals can be found in the Care Plan section)  Acute Rehab PT Goals Patient Stated Goal: get better PT Goal Formulation: With patient Time For Goal Achievement: 01/17/21 Potential to Achieve Goals: Good    Frequency Min 2X/week   Barriers to discharge Decreased caregiver support      Co-evaluation               AM-PAC PT "6 Clicks" Mobility  Outcome Measure Help needed turning from your back to your side while in a flat bed without using bedrails?: None Help needed moving from lying on your back to sitting on the side of a flat bed without using bedrails?: A Little Help needed moving to and from a bed to a chair (including a wheelchair)?: A Little Help needed standing up from a chair using your arms (e.g., wheelchair or bedside chair)?: A Little Help needed to walk in hospital room?: A Lot Help needed climbing 3-5 steps with a railing? : A Lot 6 Click Score: 17    End of Session Equipment Utilized During Treatment: Oxygen Activity Tolerance:  (Pt self limiting, also limited by low O2) Patient left: in chair;with chair alarm set;with call bell/phone within reach;with family/visitor present Nurse Communication: Mobility status PT Visit Diagnosis: Difficulty in walking, not elsewhere classified  (R26.2);Unsteadiness on feet (R26.81);Muscle weakness (generalized) (M62.81)    Time: [id number] PT Time Calculation (min) (ACUTE ONLY): 26 min   Charges:   PT Evaluation $PT Eval Moderate Complexity: 1 Mod PT Treatments $Therapeutic Activity: 8-22 mins        Lavone Nian, PT, DPT 01/03/21, 4:10 PM   Waunita Schooner 01/03/2021, 4:06 PM

## 2021-01-03 NOTE — Progress Notes (Signed)
Date of Admission:  12/30/2020   : Vincent Mason is a 85 y.o. male Active Problems:   Chronic kidney disease, stage 3b (HCC)   Chronic atrial fibrillation (HCC)   MDS (myelodysplastic syndrome) (HCC)   Multifocal pneumonia   Neutropenic sepsis (HCC)   Acute hypoxemic respiratory failure (HCC)    Subjective: Doing better Sob much improved No fever at all  Medications:   sodium chloride   Intravenous Once   allopurinol  200 mg Oral Daily   cholecalciferol  5,000 Units Oral Daily   furosemide  40 mg Intravenous Once   lactulose  20 g Oral Once   metoprolol tartrate  25 mg Oral BID   polyethylene glycol  17 g Oral Daily   rosuvastatin  10 mg Oral Daily   vitamin B-12  1,000 mcg Oral Daily    Objective: Vital signs in last 24 hours: Temp:  [97.7 F (36.5 C)-98.9 F (37.2 C)] 97.8 F (36.6 C) (08/12 1413) Pulse Rate:  [76-99] 94 (08/12 1413) Resp:  [16-19] 17 (08/12 1413) BP: (88-115)/(52-70) 107/62 (08/12 1413) SpO2:  [92 %-100 %] 96 % (08/12 1413)  PHYSICAL EXAM:  General: Alert, cooperative, no distress, appears stated age.  Head: Normocephalic, without obvious abnormality, atraumatic. Eyes: Conjunctivae clear, anicteric sclerae. Pupils are equal ENT Nares normal. No drainage or sinus tenderness. Lips, mucosa, and tongue normal. No Thrush Neck: Supple, symmetrical, no adenopathy, thyroid: non tender no carotid bruit and no JVD. Back: No CVA tenderness. Lungs: Bilateral air entry.  Very minimal crepitus.Marland Kitchen Heart: Regular rate and rhythm, no murmur, rub or gallop. Abdomen: Soft, non-tender,not distended. Bowel sounds normal. No masses Extremities: atraumatic, no cyanosis. No edema. No clubbing Skin: No rashes or lesions. Or bruising Lymph: Cervical, supraclavicular normal. Neurologic: Grossly non-focal  Lab Results Recent Labs    01/02/21 0540 01/03/21 0520  WBC 1.4* 1.9*  HGB 8.1* 8.5*  HCT 23.7* 25.0*  NA 136 136  K 4.9 4.5  CL 109 106  CO2 23 25  BUN  44* 40*  CREATININE 1.52* 1.47*   Liver Panel No results for input(s): PROT, ALBUMIN, AST, ALT, ALKPHOS, BILITOT, BILIDIR, IBILI in the last 72 hours. Sedimentation Rate No results for input(s): ESRSEDRATE in the last 72 hours. C-Reactive Protein No results for input(s): CRP in the last 72 hours.  Microbiology:  Studies/Results: DG Chest Port 1 View  Result Date: 01/03/2021 CLINICAL DATA:  Dyspnea. EXAM: PORTABLE CHEST 1 VIEW COMPARISON:  January 01, 2021. FINDINGS: Stable cardiomegaly. Status post coronary bypass graft. Stable bilateral patchy airspace opacities are noted most consistent with multifocal pneumonia. No pneumothorax or pleural effusion is noted. Bony thorax is unremarkable. IMPRESSION: Stable bilateral patchy airspace opacities are noted most consistent with multifocal pneumonia. Electronically Signed   By: Marijo Conception M.D.   On: 01/03/2021 11:38   ECHOCARDIOGRAM COMPLETE  Result Date: 01/02/2021    ECHOCARDIOGRAM REPORT   Patient Name:   Vincent Mason Date of Exam: 01/02/2021 Medical Rec #:  [id number]     Height:       70.0 in Accession #:    [id number]    Weight:       195.0 lb Date of Birth:  02-20-1936     BSA:          2.065 m Patient Age:    46 years      BP:           130/77 mmHg Patient Gender: M  HR:           121 bpm. Exam Location:  [facility] Procedure: 2D Echo, Color Doppler and Cardiac Doppler Indications:     I50.31 congestive heart failure-Acute Diastolic  History:         Patient has prior history of Echocardiogram examinations. Prior                  CABG, COPD and CKD; Risk Factors:Hypertension, Dyslipidemia and                  Sleep Apnea.  Sonographer:     Charmayne Sheer RDCS (AE) Referring Phys:  [id number] Sharen Hones Diagnosing Phys: Yolonda Kida MD IMPRESSIONS  1. Borderline LVF Globally.  2. Left ventricular ejection fraction, by estimation, is 50 to 55%. The left ventricle has low normal function. The left ventricle has no regional wall motion  abnormalities. The left ventricular internal cavity size was mildly dilated. Left ventricular diastolic parameters were normal.  3. Right ventricular systolic function is normal. The right ventricular size is normal.  4. Left atrial size was moderately dilated.  5. Right atrial size was mildly dilated.  6. The mitral valve is normal in structure. Mild to moderate mitral valve regurgitation.  7. The aortic valve is normal in structure. Aortic valve regurgitation is not visualized. FINDINGS  Left Ventricle: Left ventricular ejection fraction, by estimation, is 50 to 55%. The left ventricle has low normal function. The left ventricle has no regional wall motion abnormalities. The left ventricular internal cavity size was mildly dilated. There is no left ventricular hypertrophy. Left ventricular diastolic parameters were normal. Right Ventricle: The right ventricular size is normal. No increase in right ventricular wall thickness. Right ventricular systolic function is normal. Left Atrium: Left atrial size was moderately dilated. Right Atrium: Right atrial size was mildly dilated. Pericardium: There is no evidence of pericardial effusion. Mitral Valve: The mitral valve is normal in structure. Mild to moderate mitral valve regurgitation. MV peak gradient, 7.2 mmHg. The mean mitral valve gradient is 4.0 mmHg. Tricuspid Valve: The tricuspid valve is normal in structure. Tricuspid valve regurgitation is trivial. Aortic Valve: The aortic valve is normal in structure. Aortic valve regurgitation is not visualized. Aortic valve mean gradient measures 5.0 mmHg. Aortic valve peak gradient measures 7.8 mmHg. Pulmonic Valve: The pulmonic valve was normal in structure. Pulmonic valve regurgitation is not visualized. Aorta: The ascending aorta was not well visualized. IAS/Shunts: No atrial level shunt detected by color flow Doppler. Additional Comments: Borderline LVF Globally.  LEFT VENTRICLE PLAX 2D LVIDd:         5.61 cm  Diastology LVIDs:         4.23 cm LV e' medial:    9.57 cm/s LV PW:         1.42 cm LV E/e' medial:  11.7 LV IVS:        0.78 cm LV e' lateral:   12.30 cm/s                        LV E/e' lateral: 9.1  RIGHT VENTRICLE RV Basal diam:  4.00 cm RV S prime:     12.30 cm/s LEFT ATRIUM              Index       RIGHT ATRIUM           Index LA diam:        5.50 cm  2.66 cm/m  RA Area:     47.10 cm LA Vol (A2C):   75.0 ml  36.32 ml/m RA Volume:   201.00 ml 97.34 ml/m LA Vol (A4C):   100.0 ml 48.43 ml/m LA Biplane Vol: 94.7 ml  45.86 ml/m  AORTIC VALVE                    PULMONIC VALVE AV Vmax:           140.00 cm/s  PV Vmax:       0.90 m/s AV Vmean:          101.000 cm/s PV Vmean:      61.700 cm/s AV VTI:            0.229 m      PV VTI:        0.130 m AV Peak Grad:      7.8 mmHg     PV Peak grad:  3.3 mmHg AV Mean Grad:      5.0 mmHg     PV Mean grad:  2.0 mmHg LVOT Vmax:         92.00 cm/s LVOT Vmean:        60.700 cm/s LVOT VTI:          0.143 m LVOT/AV VTI ratio: 0.62 MITRAL VALVE                TRICUSPID VALVE MV Area (PHT): 5.32 cm     TR Peak grad:   38.4 mmHg MV Peak grad:  7.2 mmHg     TR Vmax:        310.00 cm/s MV Mean grad:  4.0 mmHg MV Vmax:       1.34 m/s     SHUNTS MV Vmean:      88.4 cm/s    Systemic VTI: 0.14 m MV Decel Time: 143 msec MV E velocity: 112.00 cm/s Yolonda Kida MD Electronically signed by Yolonda Kida MD Signature Date/Time: 01/02/2021/5:10:39 PM    Final      Assessment/Plan: Acute hypoxic respiratory failure secondary to bilateral pulmonary infiltrates.  Has neutropenia from the myelodysplastic syndrome.  Is never been febrile.  He has not had any treatment for the myelodysplastic syndrome.  So opportunistic infection and fungal infection unlikely.  Respiratory viral pathogen PCR is negative.  Blood cultures are negative.  MRSA nares is negative. Aspergillus antigen is negative.  Cryptococcal antigen is negative.  Legionella is negative.  Fungitell is less than 31. Very  likely this is a noninfectious etiology.   TACo/TRALI/Pulmonary hemorrhage are all in the mix.   Patient has received 5 days of cefepime and azithromycin hence we will stop it.  He also received 4 days of vancomycin it was discontinued yesterday.  We will observe patient without antibiotics.   MDS with pancytopenia  AKI.  Discussed the management with the patient and Dr. Roosevelt Locks. ID will follow him peripherally this weekend.

## 2021-01-04 LAB — BASIC METABOLIC PANEL
Anion gap: 6 (ref 5–15)
BUN: 43 mg/dL — ABNORMAL HIGH (ref 8–23)
CO2: 26 mmol/L (ref 22–32)
Calcium: 8.8 mg/dL — ABNORMAL LOW (ref 8.9–10.3)
Chloride: 104 mmol/L (ref 98–111)
Creatinine, Ser: 1.64 mg/dL — ABNORMAL HIGH (ref 0.61–1.24)
GFR, Estimated: 41 mL/min — ABNORMAL LOW (ref >60)
Glucose, Bld: 111 mg/dL — ABNORMAL HIGH (ref 70–99)
Potassium: 4.5 mmol/L (ref 3.5–5.1)
Sodium: 136 mmol/L (ref 135–145)

## 2021-01-04 LAB — CULTURE, BLOOD (ROUTINE X 2)
Culture: NO GROWTH
Culture: NO GROWTH
Special Requests: ADEQUATE
Special Requests: ADEQUATE

## 2021-01-04 LAB — CMV DNA, QUANTITATIVE, PCR
CMV DNA Quant: NEGATIVE IU/mL
Log10 CMV Qn DNA Pl: UNDETERMINED log10 IU/mL

## 2021-01-04 LAB — PHOSPHORUS: Phosphorus: 3.6 mg/dL (ref 2.5–4.6)

## 2021-01-04 LAB — MAGNESIUM: Magnesium: 2.5 mg/dL — ABNORMAL HIGH (ref 1.7–2.4)

## 2021-01-04 NOTE — Plan of Care (Signed)
  Problem: Education: Goal: Knowledge of General Education information will improve Description: Including pain rating scale, medication(s)/side effects and non-pharmacologic comfort measures 01/04/2021 1156 by Cristela Blue, RN Outcome: Progressing 01/04/2021 1156 by Cristela Blue, RN Outcome: Progressing   Problem: Health Behavior/Discharge Planning: Goal: Ability to manage health-related needs will improve 01/04/2021 1156 by Cristela Blue, RN Outcome: Progressing 01/04/2021 1156 by Cristela Blue, RN Outcome: Progressing   Problem: Clinical Measurements: Goal: Ability to maintain clinical measurements within normal limits will improve 01/04/2021 1156 by Cristela Blue, RN Outcome: Progressing 01/04/2021 1156 by Cristela Blue, RN Outcome: Progressing Goal: Will remain free from infection 01/04/2021 1156 by Cristela Blue, RN Outcome: Progressing 01/04/2021 1156 by Cristela Blue, RN Outcome: Progressing Goal: Diagnostic test results will improve 01/04/2021 1156 by Cristela Blue, RN Outcome: Progressing 01/04/2021 1156 by Cristela Blue, RN Outcome: Progressing Goal: Respiratory complications will improve 01/04/2021 1156 by Cristela Blue, RN Outcome: Progressing 01/04/2021 1156 by Cristela Blue, RN Outcome: Progressing Goal: Cardiovascular complication will be avoided 01/04/2021 1156 by Cristela Blue, RN Outcome: Progressing 01/04/2021 1156 by Cristela Blue, RN Outcome: Progressing   Problem: Activity: Goal: Risk for activity intolerance will decrease 01/04/2021 1156 by Cristela Blue, RN Outcome: Progressing 01/04/2021 1156 by Cristela Blue, RN Outcome: Progressing   Problem: Nutrition: Goal: Adequate nutrition will be maintained 01/04/2021 1156 by Cristela Blue, RN Outcome: Progressing 01/04/2021 1156 by Cristela Blue, RN Outcome: Progressing   Problem: Coping: Goal: Level of anxiety will decrease 01/04/2021 1156 by Cristela Blue, RN Outcome:  Progressing 01/04/2021 1156 by Cristela Blue, RN Outcome: Progressing   Problem: Elimination: Goal: Will not experience complications related to bowel motility 01/04/2021 1156 by Cristela Blue, RN Outcome: Progressing 01/04/2021 1156 by Cristela Blue, RN Outcome: Progressing Goal: Will not experience complications related to urinary retention 01/04/2021 1156 by Cristela Blue, RN Outcome: Progressing 01/04/2021 1156 by Cristela Blue, RN Outcome: Progressing   Problem: Pain Managment: Goal: General experience of comfort will improve 01/04/2021 1156 by Cristela Blue, RN Outcome: Progressing 01/04/2021 1156 by Cristela Blue, RN Outcome: Progressing   Problem: Safety: Goal: Ability to remain free from injury will improve 01/04/2021 1156 by Cristela Blue, RN Outcome: Progressing 01/04/2021 1156 by Cristela Blue, RN Outcome: Progressing   Problem: Skin Integrity: Goal: Risk for impaired skin integrity will decrease 01/04/2021 1156 by Cristela Blue, RN Outcome: Progressing 01/04/2021 1156 by Cristela Blue, RN Outcome: Progressing

## 2021-01-04 NOTE — Plan of Care (Signed)
m °

## 2021-01-04 NOTE — Progress Notes (Signed)
The patient woke up stating, "I feel like I can't breath with this mask." He requested that his bipap be removed and to be placed on 2L [place] for more comfort. Discussed the importance of wearing bipap during sleep, but patient continue to decline the replacement and wishes to remain on 2L [place] for the remainder of the shift. VSS. Call light within reach. Instructed to call for assistance. Will continue to monitor.

## 2021-01-04 NOTE — Progress Notes (Signed)
PROGRESS NOTE    Vincent Mason  [id number] DOB: [birth year] DOA: 12/30/2020 PCP: Birdie Sons, MD    Brief Narrative:   Vincent Mason is a 85 y.o. 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 fib not on oral anticoagulation, CKD 3B, chronic back pain, recent admission 7/23-7/26 for hemoptysis, who presented to [facility] ED due to worsening shortness of breath. Work-up showed multifocal pneumonia on CT chest, white cell count 1.7, platelet count 16, hemoglobin 7.2.  Assessment & Plan:   Active Problems:   Chronic kidney disease, stage 3b (HCC)   Chronic atrial fibrillation (HCC)   MDS (myelodysplastic syndrome) (HCC)   Multifocal pneumonia   Neutropenic sepsis ([place])   Acute hypoxemic respiratory failure ([place])  #1.  Neutropenic sepsis secondary to multifocal pneumonia. Acute hypoxemic respiratory failure secondary to multifocal pneumonia Multifocal pneumonia. Patient has been seen by ID, antibiotics discontinued.  Overall patient condition has been improving, can no longer need BiPAP at nighttime.  I will observe him for 1 more night to make sure he does not need additional BiPAP.  And he probably will be discharged home tomorrow.  He also received 2 doses of IV Lasix.  Looks like he will need a home oxygen.  He is currently on 2 L oxygen, he desats to 84% after removal of oxygen.  2.  New diagnosis of high-grade MDS P Pancytopenia secondary to MDS. Severe anemia neutropenia and severe thrombocytopenia. Rectal bleeding secondary to hemorrhoids. Patient received blood transfusion, he also received a platelet transfusion for rectal bleeding.  He no longer has any rectal bleeding today. He has a poor prognosis per oncology.  3.  Chronic atrial fibrillation with rapid ventricle response. Chronic systolic congestive heart failure. Coronary disease status post CABG and PCI. Elevated troponin secondary to  sepsis. Condition stable.  Patient has been evaluated by physical therapist, recommended nursing home placement.  Patient refused to go to nursing home.  He will be discharged home with home care tomorrow.  Family updated.  DVT prophylaxis: SCDs Code Status: full Family Communication: Daughter updated. Disposition Plan:    Status is: Inpatient  Remains inpatient appropriate because:Inpatient level of care appropriate due to severity of illness  Dispo:  Patient From: Home  Planned Disposition: Home with Health Care Svc  Medically stable for discharge: No          I/O last 3 completed shifts: In: 47 [P.O.:720; IV Piggyback:100] Out: [id number] [Urine:1650] No intake/output data recorded.     Consultants:  ID  Procedures: None  Antimicrobials: None  Subjective: Patient doing better today, he did not require BiPAP last night.  He was on 2 L oxygen overnight.  Attempt to remove oxygen without success, he desats to 84%. No abdominal pain nausea vomiting. No fever chills pain No dysuria or hematuria.  Objective: Vitals:   01/04/21 0409 01/04/21 0737 01/04/21 1228 01/04/21 1400  BP: 97/61 109/68 (!) 102/57   Pulse: 73 76 87   Resp: '16 18 18   '$ Temp: 97.8 F (36.6 C) 97.8 F (36.6 C) 97.8 F (36.6 C)   TempSrc: Oral  Oral   SpO2:  97% 97% (!) 88%  Weight:      Height:        Intake/Output Summary (Last 24 hours) at 01/04/2021 1437 Last data filed at 01/04/2021 0200 Gross per 24 hour  Intake 360 ml  Output 1350 ml  Net -990 ml   [employer]  12/30/20 1149  Weight: 88.5 kg    Examination:  General exam: Appears calm and comfortable  Respiratory system: Clear to auscultation. Respiratory effort normal. Cardiovascular system: S1 & S2 heard, RRR. No JVD, murmurs, rubs, gallops or clicks. No pedal edema. Gastrointestinal system: Abdomen is nondistended, soft and nontender. No organomegaly or masses felt. Normal bowel sounds heard. Central nervous system:  Alert and oriented x3. No focal neurological deficits. Extremities: Symmetric 5 x 5 power. Skin: No rashes, lesions or ulcers Psychiatry: Judgement and insight appear normal. Mood & affect appropriate.     Data Reviewed: I have personally reviewed following labs and imaging studies  CBC: Recent Labs  Lab 12/31/20 0731 01/01/21 0517 01/02/21 0540 01/03/21 0520 01/04/21 0436  WBC 1.1* 2.1* 1.4* 1.9* 2.0*  NEUTROABS 0.4* 0.6* 0.3* 0.5* 0.4*  HGB 7.3* 8.4* 8.1* 8.5* 8.8*  HCT 22.0* 24.2* 23.7* 25.0* 25.9*  MCV 97.8 94.9 95.2 96.5 94.2  PLT 33* 28* 28* 32* 24*   Basic Metabolic Panel: Recent Labs  Lab 12/31/20 0731 01/01/21 0517 01/02/21 0540 01/03/21 0520 01/04/21 0436  NA 137 136 136 136 136  K 4.8 4.6 4.9 4.5 4.5  CL 106 106 109 106 104  CO2 21* '23 23 25 26  '$ GLUCOSE 141* 110* 99 102* 111*  BUN 54* 50* 44* 40* 43*  CREATININE 1.84* 1.63* 1.52* 1.47* 1.64*  CALCIUM 8.1* 8.4* 8.4* 8.7* 8.8*  MG 2.4 2.6*  --  2.3 2.5*  PHOS 4.2 3.0  --   --  3.6   GFR: Estimated Creatinine Clearance: 38.2 mL/min (A) (by C-G formula based on SCr of 1.64 mg/dL (H)). Liver Function Tests: Recent Labs  Lab 12/30/20 1200 12/31/20 0731  AST 23 28  ALT 14 16  ALKPHOS 46 39  BILITOT 1.4* 1.4*  PROT 6.6 6.0*  ALBUMIN 3.4* 3.1*   No results for input(s): LIPASE, AMYLASE in the last 168 hours. No results for input(s): AMMONIA in the last 168 hours. Coagulation Profile: No results for input(s): INR, PROTIME in the last 168 hours. Cardiac Enzymes: No results for input(s): CKTOTAL, CKMB, CKMBINDEX, TROPONINI in the last 168 hours. BNP (last 3 results) No results for input(s): PROBNP in the last 8760 hours. HbA1C: No results for input(s): HGBA1C in the last 72 hours. CBG: No results for input(s): GLUCAP in the last 168 hours. Lipid Profile: No results for input(s): CHOL, HDL, LDLCALC, TRIG, CHOLHDL, LDLDIRECT in the last 72 hours. Thyroid Function Tests: No results for input(s):  TSH, T4TOTAL, FREET4, T3FREE, THYROIDAB in the last 72 hours. Anemia Panel: No results for input(s): VITAMINB12, FOLATE, FERRITIN, TIBC, IRON, RETICCTPCT in the last 72 hours. Sepsis Labs: Recent Labs  Lab 12/31/20 1950 01/01/21 0517  PROCALCITON 0.80 0.56    Recent Results (from the past 240 hour(s))  Resp Panel by RT-PCR (Flu A&B, Covid) Nasopharyngeal Swab     Status: None   Collection Time: 12/30/20 12:24 PM   Specimen: Nasopharyngeal Swab; Nasopharyngeal(NP) swabs in vial transport medium  Result Value Ref Range Status   SARS Coronavirus 2 by RT PCR NEGATIVE NEGATIVE Final    Comment: (NOTE) SARS-CoV-2 target nucleic acids are NOT DETECTED.  The SARS-CoV-2 RNA is generally detectable in upper respiratory specimens during the acute phase of infection. The lowest concentration of SARS-CoV-2 viral copies this assay can detect is 138 copies/mL. A negative result does not preclude SARS-Cov-2 infection and should not be used as the sole basis for treatment or other patient management decisions. A negative result  may occur with  improper specimen collection/handling, submission of specimen other than nasopharyngeal swab, presence of viral mutation(s) within the areas targeted by this assay, and inadequate number of viral copies(<138 copies/mL). A negative result must be combined with clinical observations, patient history, and epidemiological information. The expected result is Negative.  Fact Sheet for Patients:  EntrepreneurPulse.com.au  Fact Sheet for Healthcare Providers:  IncredibleEmployment.be  This test is no t yet approved or cleared by the [place] FDA and  has been authorized for detection and/or diagnosis of SARS-CoV-2 by FDA under an Emergency Use Authorization (EUA). This EUA will remain  in effect (meaning this test can be used) for the duration of the COVID-19 declaration under Section 564(b)(1) of the Act,  21 U.S.C.section 360bbb-3(b)(1), unless the authorization is terminated  or revoked sooner.       Influenza A by PCR NEGATIVE NEGATIVE Final   Influenza B by PCR NEGATIVE NEGATIVE Final    Comment: (NOTE) The Xpert Xpress SARS-CoV-2/FLU/RSV plus assay is intended as an aid in the diagnosis of influenza from Nasopharyngeal swab specimens and should not be used as a sole basis for treatment. Nasal washings and aspirates are unacceptable for Xpert Xpress SARS-CoV-2/FLU/RSV testing.  Fact Sheet for Patients: EntrepreneurPulse.com.au  Fact Sheet for Healthcare Providers: IncredibleEmployment.be  This test is not yet approved or cleared by the [place] FDA and has been authorized for detection and/or diagnosis of SARS-CoV-2 by FDA under an Emergency Use Authorization (EUA). This EUA will remain in effect (meaning this test can be used) for the duration of the COVID-19 declaration under Section 564(b)(1) of the Act, 21 U.S.C. section 360bbb-3(b)(1), unless the authorization is terminated or revoked.  Performed at [facility], [address] [place], [place] [address]   Blood culture (routine x 2)     Status: None   Collection Time: 12/30/20  1:59 PM   Specimen: BLOOD  Result Value Ref Range Status   Specimen Description BLOOD BLOOD RIGHT FOREARM  Final   Special Requests   Final    BOTTLES DRAWN AEROBIC AND ANAEROBIC Blood Culture adequate volume   Culture   Final    NO GROWTH 5 DAYS Performed at [facility], [address] [place], [place] [address]    Report Status 01/04/2021 FINAL  Final  Blood culture (routine x 2)     Status: None   Collection Time: 12/30/20  1:59 PM   Specimen: BLOOD  Result Value Ref Range Status   Specimen Description BLOOD LEFT ANTECUBITAL  Final   Special Requests   Final    BOTTLES DRAWN AEROBIC AND ANAEROBIC Blood Culture adequate volume   Culture   Final    NO GROWTH 5  DAYS Performed at [facility], [address], [place], [place] [address]    Report Status 01/04/2021 FINAL  Final  MRSA Next Gen by PCR, Nasal     Status: None   Collection Time: 01/01/21  3:02 PM   Specimen: Nasal Mucosa; Nasal Swab  Result Value Ref Range Status   MRSA by PCR Next Gen NOT DETECTED NOT DETECTED Final    Comment: (NOTE) The GeneXpert MRSA Assay (FDA approved for NASAL specimens only), is one component of a comprehensive MRSA colonization surveillance program. It is not intended to diagnose MRSA infection nor to guide or monitor treatment for MRSA infections. Test performance is not FDA approved in patients less than 17 years old. Performed at [facility], [address] [place],  [place] [address]   Respiratory (~20 pathogens) panel by PCR     Status: None   Collection Time: 01/01/21  9:10 PM  Result Value Ref Range Status   Adenovirus NOT DETECTED NOT DETECTED Final   Coronavirus 229E NOT DETECTED NOT DETECTED Final    Comment: (NOTE) The Coronavirus on the Respiratory Panel, DOES NOT test for the novel  Coronavirus (2019 nCoV)    Coronavirus HKU1 NOT DETECTED NOT DETECTED Final   Coronavirus NL63 NOT DETECTED NOT DETECTED Final   Coronavirus OC43 NOT DETECTED NOT DETECTED Final   Metapneumovirus NOT DETECTED NOT DETECTED Final   Rhinovirus / Enterovirus NOT DETECTED NOT DETECTED Final   Influenza A NOT DETECTED NOT DETECTED Final   Influenza B NOT DETECTED NOT DETECTED Final   Parainfluenza Virus 1 NOT DETECTED NOT DETECTED Final   Parainfluenza Virus 2 NOT DETECTED NOT DETECTED Final   Parainfluenza Virus 3 NOT DETECTED NOT DETECTED Final   Parainfluenza Virus 4 NOT DETECTED NOT DETECTED Final   Respiratory Syncytial Virus NOT DETECTED NOT DETECTED Final   Bordetella pertussis NOT DETECTED NOT DETECTED Final   Bordetella Parapertussis NOT DETECTED NOT DETECTED Final   Chlamydophila pneumoniae NOT DETECTED NOT DETECTED Final    Mycoplasma pneumoniae NOT DETECTED NOT DETECTED Final    Comment: Performed at [facility], [place]. [address] [place] [address], [place], [place] [address]  Aspergillus Ag, BAL/Serum     Status: None   Collection Time: 01/02/21  5:40 AM   Specimen: Vein; Blood  Result Value Ref Range Status   Aspergillus Ag, BAL/Serum 0.06 0.00 - 0.49 Index Final    Comment: (NOTE) Performed At: [facility] [address] [place], [place] [id number] Rush Farmer MD [id number]          Radiology Studies: DG Chest Port 1 View  Result Date: 01/03/2021 CLINICAL DATA:  Dyspnea. EXAM: PORTABLE CHEST 1 VIEW COMPARISON:  January 01, 2021. FINDINGS: Stable cardiomegaly. Status post coronary bypass graft. Stable bilateral patchy airspace opacities are noted most consistent with multifocal pneumonia. No pneumothorax or pleural effusion is noted. Bony thorax is unremarkable. IMPRESSION: Stable bilateral patchy airspace opacities are noted most consistent with multifocal pneumonia. Electronically Signed   By: Marijo Conception M.D.   On: 01/03/2021 11:38        Scheduled Meds:  allopurinol  200 mg Oral Daily   cholecalciferol  5,000 Units Oral Daily   metoprolol tartrate  25 mg Oral BID   polyethylene glycol  17 g Oral Daily   rosuvastatin  10 mg Oral Daily   vitamin B-12  1,000 mcg Oral Daily   Continuous Infusions:   LOS: 5 days    Time spent: 27 minutes    Sharen Hones, MD [facility]   To contact the attending provider between 7A-7P or the covering provider during after hours 7P-7A, please log into the web site [email or web address] and access using universal [place] password for that web site. If you do not have the password, please call the hospital operator.  01/04/2021, 2:37 PM

## 2021-01-04 NOTE — Evaluation (Signed)
Occupational Therapy Evaluation Patient Details Name: Vincent Mason MRN: [id number] DOB: 1935-12-05 Today's Date: 01/04/2021    History of Present Illness Pt is an 85 y/o M admitted on 12/30/20 with c/o worsening SOB. Work-up revealed multifocal PNA on chest CT. PMH: newly diagnosed high-grade MD (12/20/20), CAD s/p CABG, PCI, chronic a-fib not on oral anticoagulation, CKD 3B, chronic back pain, recent admission 7/23-7/26 for hemoptysis   Clinical Impression   Pt seen for OT evaluation this am - upon arrival pt on 1 L O2 - and O2 levels 87% - Pt very verbal about his night , equipment not working, labwork thru the night and vitals taken, food not good- and hard to redirect pt- but later in session if talking about non medical situation - able to hold good conversation. Pt O2 increase to 2 L during transfers supine to sit to chair- using RW -and O2 sats still drop elbow 90% - and hard time breathing thru his nose and keeping canula in nose. Need max v/c to increase O2 sats. Pt's fingers stay cold - don't know if affect the reading.  Pt not dizzy during functional mobility - no LOB and able maintain his standing balance during simulated LB ADL's in standing. Bilateral UE AROM and strength WFL - and grip strength. Pt impulsive and activity tolerance(O2 levels) limiting his ADL's and IADL's to bathroom and using walker - pt can benefit from continues OT services to increase his Ind in functional mobility and ADL's while maintaining his vital signs.    Follow Up Recommendations  Home health OT    Equipment Recommendations       Recommendations for Other Services       Precautions / Restrictions Precautions Precautions: Fall Restrictions Weight Bearing Restrictions: No      Mobility Bed Mobility                    Transfers                      Balance Overall balance assessment: Independent Sitting-balance support: No upper extremity supported Sitting balance-Leahy  Scale: Normal Sitting balance - Comments: good   Standing balance support: No upper extremity supported Standing balance-Leahy Scale: Good Standing balance comment: but used walker transfer to chair - but could maintain balance during LB ADL's simulation in standing - no LOB                           ADL either performed or assessed with clinical judgement   ADL                                         General ADL Comments: UB ADL's at EOB supervisition wtih setup - and LB dressing and bathing Min A with setup - toilet transfer S using walker but mod A for safety and to slow down     Vision Patient Visual Report: No change from baseline       Perception     Praxis      Pertinent Vitals/Pain Pain Assessment: No/denies pain     Hand Dominance     Extremity/Trunk Assessment Upper Extremity Assessment Upper Extremity Assessment: Overall WFL for tasks assessed           Communication     Cognition Arousal/Alertness: Awake/alert  General Comments: appear to by Orientated and following directions - but just very negative about his situation in hospital - frustrated - but with non medical topics better   General Comments       Exercises     Shoulder Instructions      Home Living Family/patient expects to be discharged to:: Private residence Living Arrangements: Spouse/significant other Available Help at Discharge: Family Type of Home: House Home Access: Level entry           Bathroom Shower/Tub: Occupational psychologist: Standard                Prior Functioning/Environment                   OT Problem List: Decreased activity tolerance;Impaired balance (sitting and/or standing);Decreased safety awareness      OT Treatment/Interventions: Self-care/ADL training;Patient/family education;Neuromuscular education;Balance training    OT Goals(Current goals  can be found in the care plan section) Acute Rehab OT Goals Patient Stated Goal: Go home OT Goal Formulation: With patient Time For Goal Achievement: 01/18/21 Potential to Achieve Goals: Good  OT Frequency: Min 2X/week   Barriers to D/C:            Co-evaluation              AM-PAC OT "6 Clicks" Daily Activity     Outcome Measure Help from another person eating meals?: None Help from another person taking care of personal grooming?: None Help from another person toileting, which includes using toliet, bedpan, or urinal?: A Little Help from another person bathing (including washing, rinsing, drying)?: A Little Help from another person to put on and taking off regular upper body clothing?: A Little Help from another person to put on and taking off regular lower body clothing?: A Little 6 Click Score: 20   End of Session Equipment Utilized During Treatment: Gait belt Nurse Communication: Mobility status  Activity Tolerance: Patient tolerated treatment well Patient left: with chair alarm set  OT Visit Diagnosis: Muscle weakness (generalized) (M62.81);Unsteadiness on feet (R26.81)                Time: [id number] OT Time Calculation (min): 45 min Charges:  OT General Charges $OT Visit: 1 Visit OT Evaluation $OT Eval Low Complexity: 1 Low OT Treatments $Self Care/Home Management : 8-22 mins   Brodi Nery OTR/L,CLT 01/04/2021, 2:08 PM

## 2021-01-04 NOTE — Progress Notes (Signed)
Placed patient on bipap.  Patient upset about not getting a softer mask per dr conversation. I explained hospital only has current mask and options for mask selection is done in home setting.  Patient c/o mask kept coming a loose last night.  I reeducated patient about not placing fingers in the side of the mask for air.  This causes mask potential mask disconnects and machine alarms due to excessive leaks which was the case twice last night.  Patient has ongoing issue with "being hot" and air doesn't seem to be working.  This was the case last night and tonight as well.  Verified temp setting noted. I turned on his fan and placed it on table as it was not in use. Patient content after such.  Will continue to monitor

## 2021-01-05 LAB — CBC WITH DIFFERENTIAL/PLATELET
Abs Immature Granulocytes: 0.08 10*3/uL — ABNORMAL HIGH (ref 0.00–0.07)
Basophils Absolute: 0 10*3/uL (ref 0.0–0.1)
Basophils Relative: 1 %
Eosinophils Absolute: 0 10*3/uL (ref 0.0–0.5)
Eosinophils Relative: 0 %
HCT: 25.9 % — ABNORMAL LOW (ref 39.0–52.0)
Hemoglobin: 8.8 g/dL — ABNORMAL LOW (ref 13.0–17.0)
Immature Granulocytes: 4 %
Lymphocytes Relative: 49 %
Lymphs Abs: 1 10*3/uL (ref 0.7–4.0)
MCH: 32 pg (ref 26.0–34.0)
MCHC: 34 g/dL (ref 30.0–36.0)
MCV: 94.2 fL (ref 80.0–100.0)
Monocytes Absolute: 0.5 10*3/uL (ref 0.1–1.0)
Monocytes Relative: 26 %
Neutro Abs: 0.4 10*3/uL — CL (ref 1.7–7.7)
Neutrophils Relative %: 20 %
Platelets: 24 10*3/uL — CL (ref 150–400)
RBC: 2.75 MIL/uL — ABNORMAL LOW (ref 4.22–5.81)
RDW: 18.7 % — ABNORMAL HIGH (ref 11.5–15.5)
Smear Review: NORMAL
WBC: 2 10*3/uL — ABNORMAL LOW (ref 4.0–10.5)
nRBC: 2.5 % — ABNORMAL HIGH (ref 0.0–0.2)

## 2021-01-05 LAB — MISC LABCORP TEST (SEND OUT): Labcorp test code: 164284

## 2021-01-05 MED ORDER — ALBUTEROL SULFATE HFA 108 (90 BASE) MCG/ACT IN AERS: 2.0000 | INHALATION_SPRAY | Freq: Four times a day (QID) | RESPIRATORY_TRACT | 0 refills | Status: AC | PRN

## 2021-01-05 NOTE — Progress Notes (Signed)
SATURATION QUALIFICATIONS: (This note is used to comply with regulatory documentation for home oxygen)  Patient Saturations on Room Air at Rest = 91%  Patient Saturations on Room Air while Ambulating = 84%  Patient Saturations on 2 Liters of oxygen while Ambulating = 92%  Please briefly explain why patient needs home oxygen: Patient Sob while ambulating

## 2021-01-05 NOTE — Discharge Summary (Signed)
Physician Discharge Summary  Patient ID: JABRILL PAYANT MRN: [id number] DOB/AGE: [birth year] 85 y.o.  Admit date: 12/30/2020 Discharge date: 01/05/2021  Admission Diagnoses:  Discharge Diagnoses:  Active Problems:   Chronic kidney disease, stage 3b (HCC)   Chronic atrial fibrillation (HCC)   MDS (myelodysplastic syndrome) (HCC)   Multifocal pneumonia   Neutropenic sepsis ([place])   Acute hypoxemic respiratory failure [facility])   Discharged Condition: fair  Hospital Course:  Vincent Mason is a 85 y.o. 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 fib not on oral anticoagulation, CKD 3B, chronic back pain, recent admission 7/23-7/26 for hemoptysis, who presented to [facility] ED due to worsening shortness of breath. Work-up showed multifocal pneumonia on CT chest, white cell count 1.7, platelet count 16, hemoglobin 7.2.  #1.  Neutropenic sepsis secondary to multifocal pneumonia. Acute hypoxemic respiratory failure secondary to multifocal pneumonia Multifocal pneumonia. Patient has been seen by ID, treated with a few days antibiotics, antibiotics discontinued.  He initially required BiPAP for acute respiratory failure, overall patient condition has been improving, no longer need BiPAP at nighttime.  He is currently on 2 L oxygen, will prescribe home oxygen.  He also received 2 doses of IV Lasix.     2.  New diagnosis of high-grade MDS  Pancytopenia secondary to MDS. Severe anemia neutropenia and severe thrombocytopenia. Rectal bleeding secondary to hemorrhoids. Patient received blood transfusion, he also received a platelet transfusion for rectal bleeding.  He no longer has any rectal bleeding today.  He is  seen by GI, believe this is secondary to hemorrhoids bleeding. He has a poor prognosis per oncology. Patient will be followed by oncology as outpatient.   3.  Chronic atrial fibrillation with rapid ventricle  response. Chronic systolic congestive heart failure. Coronary disease status post CABG and PCI. Elevated troponin secondary to sepsis. Condition stable.   Consults: pulmonary/intensive care, ID, and hematology/oncology GI Significant Diagnostic Studies:   CT ANGIOGRAPHY CHEST WITH CONTRAST   TECHNIQUE: Multidetector CT imaging of the chest was performed using the standard protocol during bolus administration of intravenous contrast. Multiplanar CT image reconstructions and MIPs were obtained to evaluate the vascular anatomy.   CONTRAST:  56m OMNIPAQUE IOHEXOL 350 MG/ML SOLN   COMPARISON:  Chest radiography same day.  CT chest 12/14/2020.   FINDINGS: Cardiovascular: Heart is enlarged. There is four-chamber enlargement, but particular enlargement of the right atrium. There is no pericardial fluid. There is been previous median sternotomy and CABG. Pulmonary arterial opacification is good. There are no pulmonary emboli.   Mediastinum/Nodes: No mediastinal or hilar mass or lymphadenopathy.   Lungs/Pleura: Considerable worsening of bilateral pulmonary infiltrates with a geographic pattern. Findings suggest viral pneumonia, possibly coronavirus. Small amount of pleural fluid on the left. No dense lobar consolidation or collapse.   Upper Abdomen: No acute finding. Nonobstructing renal stone on the right. Chronic 2.8 cm fatty mass of the left adrenal gland.   Musculoskeletal: Chronic thoracic degenerative changes. Old T12 compression fracture treated with vertebral augmentation.   Review of the MIP images confirms the above findings.   IMPRESSION: No pulmonary emboli.   Marked worsening of diffuse geographic pulmonary infiltrates consistent with worsening atypical pneumonia, possibly coronavirus.   Four-chamber cardiac enlargement, with particular enlargement of the right atrium.   Aortic Atherosclerosis (ICD10-I70.0).     Electronically Signed   By: MNelson Chimes M.D.   On: 12/30/2020 15:19     Treatments: O2, antibiotics, lasix  Discharge Exam: Blood pressure 108/62, pulse 88, temperature 98.4 F (36.9 C), temperature source Oral, resp. rate 17, height '5\' 10"'$  (1.778 m), weight 88.5 kg, SpO2 93 %. General appearance: alert and cooperative Resp: decreased breathing sounds Cardio: regular rate and rhythm, S1, S2 normal, no murmur, click, rub or gallop GI: soft, non-tender; bowel sounds normal; no masses,  no organomegaly Extremities: extremities normal, atraumatic, no cyanosis or edema  Disposition: Discharge disposition: 01-Home or Self Care       Discharge Instructions     Diet - low sodium heart healthy   Complete by: As directed    Increase activity slowly   Complete by: As directed       Allergies as of 01/05/2021   No Known Allergies      Medication List     STOP taking these medications    polyethylene glycol 17 g packet Commonly known as: MIRALAX / GLYCOLAX       TAKE these medications    albuterol 108 (90 Base) MCG/ACT inhaler Commonly known as: VENTOLIN HFA Inhale 2 puffs into the lungs every 6 (six) hours as needed for wheezing or shortness of breath.   allopurinol 100 MG tablet Commonly known as: ZYLOPRIM Take 2 tablets (200 mg total) by mouth daily.   gabapentin 300 MG capsule Commonly known as: NEURONTIN Take 1 capsule (300 mg total) by mouth at bedtime.   metoprolol succinate 25 MG 24 hr tablet Commonly known as: TOPROL-XL TAKE 1/2 A TABLET BY MOUTH DAILY What changed: See the new instructions.   oxyCODONE 15 MG immediate release tablet Commonly known as: ROXICODONE TAKE 1 TABLET BY MOUTH EVERY 4 HOURS AS NEEDED What changed: reasons to take this   Proctozone-HC 2.5 % rectal cream Generic drug: hydrocortisone PLACE 1 APPLICATION RECTALLY TWICE DAILYAS DIRECTED What changed: See the new instructions.   rosuvastatin 10 MG tablet Commonly known as: CRESTOR TAKE 1 TABLET BY MOUTH ONCE  DAILY   spironolactone 25 MG tablet Commonly known as: ALDACTONE Take 25 mg by mouth daily.   torsemide 20 MG tablet Commonly known as: DEMADEX Take 0.5 tablets (10 mg total) by mouth daily. Take one daily as needed for excessive swelling   vitamin B-12 1000 MCG tablet Commonly known as: CYANOCOBALAMIN Take 1 tablet (1,000 mcg total) by mouth daily.   Vitamin D-3 125 MCG (5000 UT) Tabs Take 5,000 Units by mouth daily.               Durable Medical Equipment  (From admission, onward)           Start     Ordered   01/05/21 0953  For home use only DME oxygen  Once       Question Answer Comment  Length of Need Lifetime   Mode or (Route) Nasal cannula   Liters per Minute 2   Frequency Continuous (stationary and portable oxygen unit needed)   Oxygen conserving device Yes   Oxygen delivery system Gas      01/05/21 0953            Follow-up Information     Birdie Sons, MD Follow up in 1 week(s).   Specialty: Family Medicine Contact information: [address] [place] [address] [phone]         Cammie Sickle, MD Follow up in 1 week(s).   Specialties: Internal Medicine, Oncology Contact information: [address] [place] [address] [phone]  32 minutes Signed: Sharen Hones 01/05/2021, 9:55 AM

## 2021-01-06 ENCOUNTER — Inpatient Hospital Stay: Payer: Medicare Other

## 2021-01-06 ENCOUNTER — Inpatient Hospital Stay: Payer: Medicare Other | Admitting: Internal Medicine

## 2021-01-07 ENCOUNTER — Telehealth: Payer: Self-pay | Admitting: Internal Medicine

## 2021-01-07 ENCOUNTER — Inpatient Hospital Stay: Payer: Medicare Other

## 2021-01-07 LAB — EPSTEIN BARR VRS(EBV DNA BY PCR): EBV DNA QN by PCR: NEGATIVE IU/mL

## 2021-01-07 NOTE — Telephone Encounter (Signed)
I spoke to patient's son, Vincent Mason clinically not doing well at home.  On home oxygen however short of breath; EMS was called-did not have to go to the hospital for unclear reasons.  With regards to follow-up-the son will call us regarding follow-up in the cancer center.  Discussed option of hospice/ vs -hospitalization versus cancer center follow-up.  Spoke to Dr. Caryn Section updated of patient's current status-and patient unfortunate unwilling to expectation to remain full code.  Also discussed that patient will likely not get any therapy for his high-grade MDS.  GB

## 2021-01-08 ENCOUNTER — Inpatient Hospital Stay: Payer: Medicare Other

## 2021-01-08 LAB — HISTOPLASMA GALACTOMANNAN AG QN UR (REFLEXED): Histoplasma Gal'mannan Ag Qn U: 0.7 ng/mL

## 2021-01-08 NOTE — Telephone Encounter (Signed)
Oral Oncology Patient Advocate Encounter  Prior Authorization for Posaconazole has been approved.    PA# [id number] Effective dates: 12/30/20 through 05/24/21  Patients co-pay is $1246.29  Oral Oncology Clinic will continue to follow.   [place] [facility] [place] Phone [phone] Fax [phone] 01/08/2021 2:15 PM

## 2021-01-09 ENCOUNTER — Telehealth: Payer: Self-pay | Admitting: Internal Medicine

## 2021-01-09 ENCOUNTER — Telehealth: Payer: Self-pay | Admitting: Nurse Practitioner

## 2021-01-09 ENCOUNTER — Encounter: Payer: Self-pay | Admitting: Family Medicine

## 2021-01-09 ENCOUNTER — Encounter: Payer: Self-pay | Admitting: Internal Medicine

## 2021-01-09 ENCOUNTER — Other Ambulatory Visit: Payer: Self-pay | Admitting: Family Medicine

## 2021-01-09 ENCOUNTER — Inpatient Hospital Stay: Payer: Medicare Other

## 2021-01-09 DIAGNOSIS — M5136 Other intervertebral disc degeneration, lumbar region: Secondary | ICD-10-CM

## 2021-01-09 NOTE — Telephone Encounter (Signed)
On 8/18-spoke to josh/Christin-plan palliative care evaluation on 8/19.   Discussed with patient's son-in agreement.  FYI-Dr.Fisher.

## 2021-01-09 NOTE — Telephone Encounter (Signed)
Requested medications are due for refill today yes  Requested medications are on the active medication list yes  Last refill 12/12/20  Last visit 7/8  Future visit scheduled no  Notes to clinic Not Delegated.

## 2021-01-09 NOTE — Telephone Encounter (Signed)
Spoke with patient's son, West Carbo, regarding the Palliative referral/services and he was in agreement with scheduling visit.  I have scheduled an In-home Consult for 01/10/21 @ 9 AM.

## 2021-01-10 ENCOUNTER — Inpatient Hospital Stay: Payer: Medicare Other

## 2021-01-10 ENCOUNTER — Encounter: Payer: Self-pay | Admitting: Internal Medicine

## 2021-01-10 ENCOUNTER — Emergency Department
Admission: EM | Admit: 2021-01-10 | Discharge: 2021-01-10 | Disposition: A | Discharge status: Home / Self Care | Payer: Medicare Other | Attending: Emergency Medicine | Admitting: Emergency Medicine

## 2021-01-10 ENCOUNTER — Other Ambulatory Visit: Payer: Medicare Other | Admitting: Nurse Practitioner

## 2021-01-10 ENCOUNTER — Other Ambulatory Visit: Payer: Self-pay

## 2021-01-10 ENCOUNTER — Encounter: Payer: Self-pay | Admitting: Nurse Practitioner

## 2021-01-10 ENCOUNTER — Encounter: Payer: Self-pay | Admitting: Emergency Medicine

## 2021-01-10 DIAGNOSIS — Z87891 Personal history of nicotine dependence: Secondary | ICD-10-CM | POA: Diagnosis not present

## 2021-01-10 DIAGNOSIS — Z7901 Long term (current) use of anticoagulants: Secondary | ICD-10-CM | POA: Insufficient documentation

## 2021-01-10 DIAGNOSIS — I4891 Unspecified atrial fibrillation: Secondary | ICD-10-CM | POA: Insufficient documentation

## 2021-01-10 DIAGNOSIS — I499 Cardiac arrhythmia, unspecified: Secondary | ICD-10-CM | POA: Diagnosis not present

## 2021-01-10 DIAGNOSIS — I130 Hypertensive heart and chronic kidney disease with heart failure and stage 1 through stage 4 chronic kidney disease, or unspecified chronic kidney disease: Secondary | ICD-10-CM | POA: Insufficient documentation

## 2021-01-10 DIAGNOSIS — Z79899 Other long term (current) drug therapy: Secondary | ICD-10-CM | POA: Diagnosis not present

## 2021-01-10 DIAGNOSIS — I2510 Atherosclerotic heart disease of native coronary artery without angina pectoris: Secondary | ICD-10-CM | POA: Diagnosis not present

## 2021-01-10 DIAGNOSIS — I5023 Acute on chronic systolic (congestive) heart failure: Secondary | ICD-10-CM | POA: Insufficient documentation

## 2021-01-10 DIAGNOSIS — Z951 Presence of aortocoronary bypass graft: Secondary | ICD-10-CM | POA: Diagnosis not present

## 2021-01-10 DIAGNOSIS — J449 Chronic obstructive pulmonary disease, unspecified: Secondary | ICD-10-CM | POA: Insufficient documentation

## 2021-01-10 DIAGNOSIS — Z515 Encounter for palliative care: Secondary | ICD-10-CM | POA: Diagnosis not present

## 2021-01-10 DIAGNOSIS — R0902 Hypoxemia: Secondary | ICD-10-CM | POA: Diagnosis not present

## 2021-01-10 DIAGNOSIS — N1832 Chronic kidney disease, stage 3b: Secondary | ICD-10-CM | POA: Insufficient documentation

## 2021-01-10 DIAGNOSIS — R040 Epistaxis: Secondary | ICD-10-CM | POA: Insufficient documentation

## 2021-01-10 DIAGNOSIS — R58 Hemorrhage, not elsewhere classified: Secondary | ICD-10-CM | POA: Diagnosis not present

## 2021-01-10 DIAGNOSIS — Z743 Need for continuous supervision: Secondary | ICD-10-CM | POA: Diagnosis not present

## 2021-01-10 LAB — CBC
HCT: 24.2 % — ABNORMAL LOW (ref 39.0–52.0)
Hemoglobin: 8.4 g/dL — ABNORMAL LOW (ref 13.0–17.0)
MCH: 33.2 pg (ref 26.0–34.0)
MCHC: 34.7 g/dL (ref 30.0–36.0)
MCV: 95.7 fL (ref 80.0–100.0)
Platelets: 15 10*3/uL — CL (ref 150–400)
RBC: 2.53 MIL/uL — ABNORMAL LOW (ref 4.22–5.81)
RDW: 18.2 % — ABNORMAL HIGH (ref 11.5–15.5)
WBC: 3.2 10*3/uL — ABNORMAL LOW (ref 4.0–10.5)
nRBC: 1.9 % — ABNORMAL HIGH (ref 0.0–0.2)

## 2021-01-10 LAB — COMPREHENSIVE METABOLIC PANEL
ALT: 26 U/L (ref 0–44)
AST: 34 U/L (ref 15–41)
Albumin: 2.9 g/dL — ABNORMAL LOW (ref 3.5–5.0)
Alkaline Phosphatase: 55 U/L (ref 38–126)
Anion gap: 10 (ref 5–15)
BUN: 25 mg/dL — ABNORMAL HIGH (ref 8–23)
CO2: 22 mmol/L (ref 22–32)
Calcium: 8.4 mg/dL — ABNORMAL LOW (ref 8.9–10.3)
Chloride: 101 mmol/L (ref 98–111)
Creatinine, Ser: 1.46 mg/dL — ABNORMAL HIGH (ref 0.61–1.24)
GFR, Estimated: 47 mL/min — ABNORMAL LOW (ref >60)
Glucose, Bld: 119 mg/dL — ABNORMAL HIGH (ref 70–99)
Potassium: 4.6 mmol/L (ref 3.5–5.1)
Sodium: 133 mmol/L — ABNORMAL LOW (ref 135–145)
Total Bilirubin: 1.2 mg/dL (ref 0.3–1.2)
Total Protein: 6.3 g/dL — ABNORMAL LOW (ref 6.5–8.1)

## 2021-01-10 LAB — PROTIME-INR
INR: 1.2 (ref 0.8–1.2)
Prothrombin Time: 15.2 seconds (ref 11.4–15.2)

## 2021-01-10 MED ORDER — TRANEXAMIC ACID FOR EPISTAXIS
500.0000 mg | Freq: Once | TOPICAL | Status: AC
  Administered 2021-01-10: 500 mg via TOPICAL
  Filled 2021-01-10: qty 10

## 2021-01-10 MED ORDER — LIDOCAINE VISCOUS HCL 2 % MT SOLN
15.0000 mL | Freq: Once | OROMUCOSAL | Status: AC
  Administered 2021-01-10: 15 mL via OROMUCOSAL
  Filled 2021-01-10: qty 15

## 2021-01-10 NOTE — ED Notes (Signed)
Patients son will be here in 15 minutes to pick pt up.

## 2021-01-10 NOTE — Discharge Instructions (Addendum)
Please follow-up with Dr. Kathyrn Sheriff early next week to have the nasal balloon removed, please do not try to remove it yourself

## 2021-01-10 NOTE — Progress Notes (Signed)
[facility] Palliative Care Consult Note Telephone: [phone]  Fax: [phone]   Date of encounter: 01/10/21 11:41 AM PATIENT NAME: Vincent Mason [id number]   [phone] (home)  DOB: 01-01-1936 MRN: [id number] PRIMARY CARE PROVIDER:    Birdie Sons, MD,  [address] [place] [address] [phone]  RESPONSIBLE PARTY:    Contact Information     Name Relation Home Work Mobile   [place], [place] [phone]  [phone]   [place], [place] [phone]  [phone]   Rust,Vicki Daughter [phone]        I met face to face with patient and family in home. Palliative Care was asked to follow this patient by consultation request of  Fisher, Kirstie Peri, MD to address advance care planning and complex medical decision making. This is the initial visit.               ASSESSMENT AND PLAN / RECOMMENDATIONS:   Symptom Management/Plan: 1. Advance Care Planning; full code full scope treatment 2. Epistaxis; severe, pale, clinically unstable hr 140-170's, afib with MDS, transported to ED by EMS for evaluation 3. Palliative care encounter; Palliative care encounter; Palliative medicine team will continue to support patient, patient's family, and medical team. Visit consisted of counseling and education dealing with the complex and emotionally intense issues of symptom management and palliative care in the setting of serious and potentially life-threatening illness  Follow up Palliative Care Visit: Palliative care will continue to follow for complex medical decision making, advance care planning, and clarification of goals. Return upon d/c from hospitalization  I spent 75 minutes providing this consultation. More than 50% of the time in this consultation was spent in counseling and care coordination  PPS: 40% Chief Complaint: initial palliative consult for complex medical decision  making  HISTORY OF PRESENT ILLNESS:  Vincent Mason is a 85 y.o. year old male with multiple medical problems including high-grade MDS/severe pancytopenia; Chronic afib with RVR, chronic systolic CHF, CAD s/p CABG/PCI, CKD stage III/IV-is. Hospitalized 12/30/2020 to 01/05/2021 for shortness of breath. Workup significant for Neutropenic sepsis secondary to multifocal pneumonia. Acute hypoxemic respiratory failure secondary to multifocal pneumonia multifocal pneumonia. Mr. Laverdure required bipap decreased to O2@2l /[place], new dx of high-grade MDS, Pancytopenia secondary to MDS, Severe anemia neutropenia and severe thrombocytopenia, Rectal bleeding secondary to hemorrhoids. Received blood and platelet transfusion for rectal bleeding. Chronic afib with RVR, chronic systolic CHF, elevated troponin secondary to sepsis. Mr. Dino requests to be full code, full scope of treatment. I called Bryon, Mr. [place] son to confirm initial pc visit and covid screening negative. Mr. Quillin developed epistaxis and first responders pulled in upon arrival for visit. I talked about Mr. Stiven, Kas[place], Mrs. Azizi about clinical symptoms, EMS placed on cardiac monitor with HR 140-170 afib uncontrolled. Mr. Marrazzo uncontrolled epistasis in the setting of MDS. Discussed with [place] Carbo since last hospitalization Mr. Cone continues to have shortness of breath, at times gasping to breath, remains on O2 continuous. West Carbo endorses Mr. Houdeshell has had difficulty talking on the phone sleeping a lot, very weak, pale. Reviewed medical records with Paramedic, recent hospitalization per epic. We talked about case and all in agreement to transport to [facility] ED for further evaluation.   History obtained from review of EMR, discussion with primary team, and interview with family, facility staff/caregiver and/or Mr. Wiley.  I reviewed available labs, medications, imaging, studies and related documents from the EMR.  Records  reviewed and summarized above.   ROS Full 14  system review of systems performed and negative with exception of: as per HPI.   Physical Exam: Constitutional:  pale General: frail appearing, thin, pale, severely debilitated, weak, male ENMT: oral mucous membranes moist MSK: muscle wasting Skin: warm and dry, pale Neuro:  +severe generalized weakness Psych: +anxious affect, A and O x 3  CURRENT PROBLEM LIST:  Patient Active Problem List   Diagnosis Date Noted   Neutropenic sepsis ([place]) 12/31/2020   Acute hypoxemic respiratory failure ([place]) 12/31/2020   Multifocal pneumonia 12/30/2020   Palliative care encounter    MDS (myelodysplastic syndrome) ([place]) 12/20/2020   Vitamin B12 deficiency 12/15/2020   Cough with hemoptysis 12/14/2020   Acute on chronic systolic CHF (congestive heart failure) ([place]) 12/14/2020   Pancytopenia ([place]) 12/14/2020   Lumbar spondylosis 01/02/2020   Compression fracture of third lumbar vertebra ([place]) 01/02/2020   Discogenic thoracic pain 01/02/2020   Chronic pain syndrome 01/02/2020   Restless leg syndrome 06/22/2018   Erectile dysfunction due to arterial insufficiency 05/04/2018   Biliary cirrhosis ([place]) 02/21/2018   Seizure ([place]) 08/06/2017   Vitamin D deficiency 04/08/2017   Neuropathy 04/08/2017   Altered mental status 01/21/2017   Blood in stool    Anemia due to chronic blood loss    Hypomagnesemia 09/14/2015   Chronic atrial fibrillation ([place]) 09/12/2015   Essential hypertension 09/12/2015   Fecal occult blood test positive 09/12/2015   Nodular hyperplasia of liver    Normocytic anemia 09/03/2015   Opioid type dependence, continuous ([place]) 09/03/2015   Hypokalemia 08/26/2015   Hyperuricemia 07/09/2015   Coronary artery disease 01/22/2015   Chronic kidney disease, stage 3b ([place]) 01/22/2015   Chronic constipation 01/22/2015   COPD (chronic obstructive pulmonary disease) ([place]) 01/22/2015   ED (erectile dysfunction) of organic origin 01/22/2015   Lymphedema of both lower extremities  01/22/2015   Fatigue 01/22/2015   Blood in the urine 01/22/2015   Calculus of kidney 01/22/2015   Memory loss 01/22/2015   Hypertensive pulmonary vascular disease ([place]) 01/22/2015   Displacement of cervical intervertebral disc without myelopathy 01/22/2015   Seizure disorder ([place]) 01/17/2015   Obstructive sleep apnea 01/17/2015   Hyperlipidemia 01/17/2015   Leg swelling 01/17/2015   Lumbar degenerative disc disease 01/17/2015   Back pain 11/12/2014   TI (tricuspid incompetence) 08/02/2014   PAST MEDICAL HISTORY:  Active Ambulatory Problems    Diagnosis Date Noted   Back pain 11/12/2014   Seizure disorder ([place]) 01/17/2015   Obstructive sleep apnea 01/17/2015   Hyperlipidemia 01/17/2015   Leg swelling 01/17/2015   Lumbar degenerative disc disease 01/17/2015   Coronary artery disease 01/22/2015   Chronic kidney disease, stage 3b ([place]) 01/22/2015   Chronic constipation 01/22/2015   COPD (chronic obstructive pulmonary disease) ([place]) 01/22/2015   ED (erectile dysfunction) of organic origin 01/22/2015   Lymphedema of both lower extremities 01/22/2015   Fatigue 01/22/2015   Blood in the urine 01/22/2015   Calculus of kidney 01/22/2015   Memory loss 01/22/2015   Hypertensive pulmonary vascular disease ([place]) 01/22/2015   Displacement of cervical intervertebral disc without myelopathy 01/22/2015   TI (tricuspid incompetence) 08/02/2014   Hyperuricemia 07/09/2015   Hypokalemia 08/26/2015   Normocytic anemia 09/03/2015   Opioid type dependence, continuous ([place]) 09/03/2015   Chronic atrial fibrillation ([place]) 09/12/2015   Essential hypertension 09/12/2015   Fecal occult blood test positive 09/12/2015   Nodular hyperplasia of liver    Hypomagnesemia 09/14/2015   Blood in stool  Anemia due to chronic blood loss    Altered mental status 01/21/2017   Vitamin D deficiency 04/08/2017   Neuropathy 04/08/2017   Seizure ([place]) 08/06/2017   Biliary cirrhosis ([place]) 02/21/2018   Erectile  dysfunction due to arterial insufficiency 05/04/2018   Restless leg syndrome 06/22/2018   Lumbar spondylosis 01/02/2020   Compression fracture of third lumbar vertebra ([place]) 01/02/2020   Discogenic thoracic pain 01/02/2020   Chronic pain syndrome 01/02/2020   Cough with hemoptysis 12/14/2020   Acute on chronic systolic CHF (congestive heart failure) ([place]) 12/14/2020   Pancytopenia ([place]) 12/14/2020   Vitamin B12 deficiency 12/15/2020   MDS (myelodysplastic syndrome) ([place]) 12/20/2020   Multifocal pneumonia 12/30/2020   Palliative care encounter    Neutropenic sepsis ([place]) 12/31/2020   Acute hypoxemic respiratory failure ([place]) 12/31/2020   Resolved Ambulatory Problems    Diagnosis Date Noted   CHF (congestive heart failure) ([place]) 01/17/2015   Atrial fibrillation ([place]) 01/17/2015   Hypertension 01/17/2015   Unresponsiveness 01/17/2015   CAP (community acquired pneumonia) 01/18/2015   Dizziness 01/22/2015   Cramp in muscle 01/22/2015   Breath shortness 11/14/2013   Fast heart beat 01/22/2015   Open leg wound 09/02/2015   Acute on chronic renal failure ([place]) 09/03/2015   History of seizure 09/03/2015   Acute encephalopathy 09/03/2015   Status post peripherally inserted central catheter (PICC) central line placement    Abdominal pain    Acute respiratory failure with hypoxia ([place]) 09/12/2015   HCAP (healthcare-associated pneumonia) 09/12/2015   C. difficile colitis 09/12/2015   Acute on chronic diastolic CHF (congestive heart failure), NYHA class 1 ([place]) 09/12/2015   Severe sepsis with septic shock ([place]) 09/12/2015   ATN (acute tubular necrosis) ([place]) 09/12/2015   Oral thrush 09/14/2015   Acute kidney injury ([place]) 12/02/2016   Past Medical History:  Diagnosis Date   Atrial fibrillation and flutter ([place]) 2013   Kidney disease, chronic, stage III (moderate, EGFR 30-59 ml/min) (HCC)    OSA (obstructive sleep apnea)    SOCIAL HX:  Social History   Tobacco Use   Smoking  status: Former   Smokeless tobacco: Never   Tobacco comments:    Quit in 1990  Substance Use Topics   Alcohol use: No   FAMILY HX:  Family History  Problem Relation Age of Onset   Hypertension Mother    Hypertension Father     reviewed  ALLERGIES: No Known Allergies   PERTINENT MEDICATIONS:  Outpatient Encounter Medications as of 01/10/2021  Medication Sig   albuterol (VENTOLIN HFA) 108 (90 Base) MCG/ACT inhaler Inhale 2 puffs into the lungs every 6 (six) hours as needed for wheezing or shortness of breath.   allopurinol (ZYLOPRIM) 100 MG tablet Take 2 tablets (200 mg total) by mouth daily.   Cholecalciferol (VITAMIN D-3) 125 MCG (5000 UT) TABS Take 5,000 Units by mouth daily.   gabapentin (NEURONTIN) 300 MG capsule Take 1 capsule (300 mg total) by mouth at bedtime.   metoprolol succinate (TOPROL-XL) 25 MG 24 hr tablet TAKE 1/2 A TABLET BY MOUTH DAILY (Patient taking differently: Take 12.5 mg by mouth daily.)   [START ON 01/11/2021] oxyCODONE (ROXICODONE) 15 MG immediate release tablet TAKE 1 TABLET BY MOUTH EVERY 4 HOURS AS NEEDED   PROCTOZONE-HC 2.5 % rectal cream PLACE 1 APPLICATION RECTALLY TWICE DAILYAS DIRECTED (Patient taking differently: Place 1 application rectally 2 (two) times daily as needed (discomfort/hemorrhoids).)   rosuvastatin (CRESTOR) 10 MG tablet TAKE 1 TABLET BY MOUTH ONCE DAILY  spironolactone (ALDACTONE) 25 MG tablet Take 25 mg by mouth daily.   torsemide (DEMADEX) 20 MG tablet Take 0.5 tablets (10 mg total) by mouth daily. Take one daily as needed for excessive swelling   vitamin B-12 (CYANOCOBALAMIN) 1000 MCG tablet Take 1 tablet (1,000 mcg total) by mouth daily.   [EXPIRED] lidocaine (XYLOCAINE) 2 % viscous mouth solution 15 mL    [EXPIRED] tranexamic acid (CYKLOKAPRON) 1000 MG/10ML topical solution 500 mg    No facility-administered encounter medications on file as of 01/10/2021.  Questions and concerns were addressed.   Thank you for the opportunity to  participate in the care of Mr. Shenk.  The palliative care team will continue to follow. Please call our office at [phone] if we can be of additional assistance.   This chart was dictated using voice recognition software.  Despite best efforts to proofread,  errors can occur which can change the documentation meaning.   Merie Wulf Z Layah Skousen, NP ,   COVID-19 PATIENT SCREENING TOOL Asked and negative response unless otherwise noted:  Have you had symptoms of covid, tested positive or been in contact with someone with symptoms/positive test in the past 5-10 days? NO

## 2021-01-10 NOTE — ED Notes (Signed)
Pt attempting to call for son to come pick him up at this time.

## 2021-01-10 NOTE — ED Provider Notes (Signed)
[facility] Emergency Department Provider Note   ____________________________________________    I have reviewed the triage vital signs and the nursing notes.   HISTORY  Chief Complaint Epistaxis (/)     HPI Vincent Mason is a 85 y.o. male with history of atrial fibrillation on Xarelto who presents with nosebleed.  Patient with complex medical history including atrial fibrillation, COPD on 3 L nasal cannula, MDS with infection, low platelets.  Per notes his oncologist and family have recently reached out for a palliative care consultation.  Patient reports his right naris started bleeding this morning, he has been unable to stop it.  He notes it is making it difficult for him to breathe  Past Medical History:  Diagnosis Date   Acute encephalopathy 09/03/2015   Atrial fibrillation and flutter [facility]) 2013   Dr Nehemiah Massed at [facility]  on [place].    C. difficile colitis 09/12/2015   COPD (chronic obstructive pulmonary disease) (HCC)    HCAP (healthcare-associated pneumonia) 09/12/2015   Hyperlipidemia    Hypertension    Kidney disease, chronic, stage III (moderate, EGFR 30-59 ml/min) ([place])    ARF 2013   OSA (obstructive sleep apnea)    non-compliant with CPAP.    Severe sepsis with septic shock ([place]) 09/12/2015    Patient Active Problem List   Diagnosis Date Noted   Neutropenic sepsis ([place]) 12/31/2020   Acute hypoxemic respiratory failure ([place]) 12/31/2020   Multifocal pneumonia 12/30/2020   Palliative care encounter    MDS (myelodysplastic syndrome) ([place]) 12/20/2020   Vitamin B12 deficiency 12/15/2020   Cough with hemoptysis 12/14/2020   Acute on chronic systolic CHF (congestive heart failure) ([place]) 12/14/2020   Pancytopenia ([place]) 12/14/2020   Lumbar spondylosis 01/02/2020   Compression fracture of third lumbar vertebra ([place]) 01/02/2020   Discogenic thoracic pain 01/02/2020   Chronic pain syndrome 01/02/2020   Restless leg syndrome 06/22/2018    Erectile dysfunction due to arterial insufficiency 05/04/2018   Biliary cirrhosis ([place]) 02/21/2018   Seizure ([place]) 08/06/2017   Vitamin D deficiency 04/08/2017   Neuropathy 04/08/2017   Altered mental status 01/21/2017   Blood in stool    Anemia due to chronic blood loss    Hypomagnesemia 09/14/2015   Chronic atrial fibrillation ([place]) 09/12/2015   Essential hypertension 09/12/2015   Fecal occult blood test positive 09/12/2015   Nodular hyperplasia of liver    Normocytic anemia 09/03/2015   Opioid type dependence, continuous ([place]) 09/03/2015   Hypokalemia 08/26/2015   Hyperuricemia 07/09/2015   Coronary artery disease 01/22/2015   Chronic kidney disease, stage 3b ([place]) 01/22/2015   Chronic constipation 01/22/2015   COPD (chronic obstructive pulmonary disease) ([place]) 01/22/2015   ED (erectile dysfunction) of organic origin 01/22/2015   Lymphedema of both lower extremities 01/22/2015   Fatigue 01/22/2015   Blood in the urine 01/22/2015   Calculus of kidney 01/22/2015   Memory loss 01/22/2015   Hypertensive pulmonary vascular disease ([place]) 01/22/2015   Displacement of cervical intervertebral disc without myelopathy 01/22/2015   Seizure disorder ([place]) 01/17/2015   Obstructive sleep apnea 01/17/2015   Hyperlipidemia 01/17/2015   Leg swelling 01/17/2015   Lumbar degenerative disc disease 01/17/2015   Back pain 11/12/2014   TI (tricuspid incompetence) 08/02/2014    Past Surgical History:  Procedure Laterality Date   CATARACT EXTRACTION     CORONARY ARTERY BYPASS GRAFT  10/1997   EYE SURGERY     history of cervical discectomy  2009   C5-C6  Hyperplastic colon polyp  02/2002   Sigmoid polyps   KYPHOPLASTY N/A 02/06/2020   Procedure: T12 & L3 Kyphoplasty;  Surgeon: Hessie Knows, MD;  Location: [facility] ORS;  Service: Orthopedics;  Laterality: N/A;    Prior to Admission medications   Medication Sig Start Date End Date Taking? Authorizing Provider  albuterol (VENTOLIN HFA) 108 (90  Base) MCG/ACT inhaler Inhale 2 puffs into the lungs every 6 (six) hours as needed for wheezing or shortness of breath. 01/05/21   Sharen Hones, MD  allopurinol (ZYLOPRIM) 100 MG tablet Take 2 tablets (200 mg total) by mouth daily. 02/15/20   Birdie Sons, MD  Cholecalciferol (VITAMIN D-3) 125 MCG (5000 UT) TABS Take 5,000 Units by mouth daily.    [provider]  gabapentin (NEURONTIN) 300 MG capsule Take 1 capsule (300 mg total) by mouth at bedtime. 12/17/20   Domenic Polite, MD  metoprolol succinate (TOPROL-XL) 25 MG 24 hr tablet TAKE 1/2 A TABLET BY MOUTH DAILY Patient taking differently: Take 12.5 mg by mouth daily. 01/04/20   Birdie Sons, MD  oxyCODONE (ROXICODONE) 15 MG immediate release tablet TAKE 1 TABLET BY MOUTH EVERY 4 HOURS AS NEEDED 01/11/21   Bacigalupo, Dionne Bucy, MD  PROCTOZONE-HC 2.5 % rectal cream PLACE 1 APPLICATION RECTALLY TWICE DAILYAS DIRECTED Patient taking differently: Place 1 application rectally 2 (two) times daily as needed (discomfort/hemorrhoids). 10/16/19   Chrismon, Vickki Muff, PA-C  rosuvastatin (CRESTOR) 10 MG tablet TAKE 1 TABLET BY MOUTH ONCE DAILY 11/05/20   Birdie Sons, MD  spironolactone (ALDACTONE) 25 MG tablet Take 25 mg by mouth daily.    [provider]  torsemide (DEMADEX) 20 MG tablet Take 0.5 tablets (10 mg total) by mouth daily. Take one daily as needed for excessive swelling 12/17/20   Domenic Polite, MD  vitamin B-12 (CYANOCOBALAMIN) 1000 MCG tablet Take 1 tablet (1,000 mcg total) by mouth daily. 12/17/20 02/15/21  Domenic Polite, MD     Allergies Patient has no known allergies.  Family History  Problem Relation Age of Onset   Hypertension Mother    Hypertension Father     Social History Social History   Tobacco Use   Smoking status: Former   Smokeless tobacco: Never   Tobacco comments:    Quit in 1990  Vaping Use   Vaping Use: Never used  Substance Use Topics   Alcohol use: No   Drug use: No    Review of  Systems  Constitutional: No fever/chills Eyes: No visual changes.  ENT: As above Cardiovascular: Denies chest pain. Respiratory: As above Gastrointestinal: No abdominal pain.  No nausea, no vomiting.   Genitourinary: Negative for dysuria. Musculoskeletal: Negative for back pain. Skin: Negative for rash. Neurological: Negative for headaches or weakness   ____________________________________________   PHYSICAL EXAM:  VITAL SIGNS: ED Triage Vitals  Enc Vitals Group     BP 01/10/21 1002 (!) 149/70     Pulse Rate 01/10/21 1002 80     Resp 01/10/21 1002 16     Temp 01/10/21 1002 98.4 F (36.9 C)     Temp Source 01/10/21 1002 Oral     SpO2 01/10/21 1002 100 %     Weight 01/10/21 1006 88.5 kg (195 lb)     Height 01/10/21 1006 1.778 m (5' 10" )     Head Circumference --      Peak Flow --      Pain Score --      Pain Loc --  Pain Edu? --      Excl. in [place]? --     Constitutional: Alert and oriented.  Eyes: Conjunctivae are normal.  Head: Atraumatic. Nose: Nasal clamp in place, dried blood on his face, nasal clamp removed bleeding only from the right naris Mouth/Throat: Mucous membranes are moist.    Cardiovascular: Tachycardia, regular rhythm, good peripheral circulation.  Good peripheral circulation. Respiratory: Mild tachypnea no retractions. Lungs CTAB. Gastrointestinal: Soft and nontender. No distention.    Musculoskeletal: No lower extremity tenderness nor edema.  Warm and well perfused Neurologic:  Normal speech and language. No gross focal neurologic deficits are appreciated.  Skin:  Skin is warm, dry and intact. No rash noted. Psychiatric: Mood and affect are normal. Speech and behavior are normal.  ____________________________________________   LABS (all labs ordered are listed, but only abnormal results are displayed)  Labs Reviewed  CBC - Abnormal; Notable for the following components:      Result Value   WBC 3.2 (*)    RBC 2.53 (*)    Hemoglobin 8.4  (*)    HCT 24.2 (*)    RDW 18.2 (*)    Platelets 15 (*)    nRBC 1.9 (*)    All other components within normal limits  COMPREHENSIVE METABOLIC PANEL - Abnormal; Notable for the following components:   Sodium 133 (*)    Glucose, Bld 119 (*)    BUN 25 (*)    Creatinine, Ser 1.46 (*)    Calcium 8.4 (*)    Total Protein 6.3 (*)    Albumin 2.9 (*)    GFR, Estimated 47 (*)    All other components within normal limits  PROTIME-INR  SAMPLE TO BLOOD BANK   ____________________________________________  EKG ED ECG REPORT I, Lavonia Drafts, the attending physician, personally viewed and interpreted this ECG.  Date: 01/10/2021  Rhythm: Atrial fibrillation QRS Axis: normal Intervals: normal ST/T Wave abnormalities: normal Narrative Interpretation: no evidence of acute ischemia   ____________________________________________  RADIOLOGY  None ____________________________________________   PROCEDURES  Procedure(s) performed: yes    .Epistaxis Management  Date/Time: 01/10/2021 12:43 PM Performed by: Lavonia Drafts, MD Authorized by: Lavonia Drafts, MD   Consent:    Consent obtained:  Verbal   Consent given by:  Patient   Risks, benefits, and alternatives were discussed: yes     Risks discussed:  Infection, bleeding, nasal injury and pain   Alternatives discussed:  No treatment Universal protocol:    Patient identity confirmed:  Verbally with patient Anesthesia:    Anesthesia method:  Topical application   Topical anesthetic:  Lidocaine gel Procedure details:    Treatment method:  Nasal balloon   Treatment complexity:  Limited Post-procedure details:    Assessment:  Bleeding decreased   Procedure completion:  Tolerated with difficulty   Critical Care performed: No ____________________________________________   INITIAL IMPRESSION / ASSESSMENT AND PLAN / ED COURSE  Pertinent labs & imaging results that were available during my care of the patient were reviewed by  me and considered in my medical decision making (see chart for details).   Patient presents with nosebleed.  He is on blood thinners and has a platelet count of 15,000 secondary to his advanced MDS.  He also has COPD and is on 3 L nasal cannula.  He initially resisted efforts to place nasal tampon/nasal balloon but has finally agreed given no improvement with more conservative measures.  Bleeding controlled with [employer], patient strongly desires to go home, I think this  is reasonable.  He does have follow-up with his oncologist as well as palliative care evaluation he will follow-up with ENT for removal of Rhino Rocket, return precautions discussed      ____________________________________________   FINAL CLINICAL IMPRESSION(S) / ED DIAGNOSES  Final diagnoses:  Right-sided epistaxis        Note:  This document was prepared using Dragon voice recognition software and may include unintentional dictation errors.    Lavonia Drafts, MD 01/10/21 [phone]

## 2021-01-10 NOTE — ED Triage Notes (Signed)
Pt comes into the ED via [facility] from home c/o epistaxis x couple hours.  Afrin given with minimal effect.  PT denies any blood thinner use.  Pt very uncooperative with EMS.  Pt currently on 3L baseline. Pt in A-fib, but has known history of it.  PT currently is a cancer patient that recently was discharged from the hospital for pneumonia.

## 2021-01-13 ENCOUNTER — Telehealth: Payer: Self-pay | Admitting: Nurse Practitioner

## 2021-01-13 ENCOUNTER — Telehealth: Payer: Self-pay | Admitting: Internal Medicine

## 2021-01-13 NOTE — Telephone Encounter (Signed)
Spoke with patient's son, [place], and have scheduled a Palliative f/u visit for 01/17/21 @ 9 AM.

## 2021-01-13 NOTE — Telephone Encounter (Signed)
On 8/22- Earlier in the morning I left a message for patient's son-checking on patient's status/regarding palliative care evaluation.    As per the message received-patient clinically stable; interested in evaluation palliative care.  Informed Ms.Christin Gusler, NP.

## 2021-01-14 ENCOUNTER — Telehealth: Payer: Self-pay | Admitting: Internal Medicine

## 2021-01-14 ENCOUNTER — Other Ambulatory Visit: Payer: Self-pay | Admitting: Hospice and Palliative Medicine

## 2021-01-14 ENCOUNTER — Encounter: Payer: Self-pay | Admitting: Nurse Practitioner

## 2021-01-14 ENCOUNTER — Other Ambulatory Visit: Payer: Medicare Other | Admitting: Nurse Practitioner

## 2021-01-14 DIAGNOSIS — Z515 Encounter for palliative care: Secondary | ICD-10-CM

## 2021-01-14 DIAGNOSIS — I5042 Chronic combined systolic (congestive) and diastolic (congestive) heart failure: Secondary | ICD-10-CM | POA: Diagnosis not present

## 2021-01-14 DIAGNOSIS — R0602 Shortness of breath: Secondary | ICD-10-CM | POA: Diagnosis not present

## 2021-01-14 MED ORDER — LORAZEPAM 0.5 MG PO TABS: 0.5000 mg | ORAL_TABLET | Freq: Three times a day (TID) | ORAL | 0 refills | Status: AC | PRN

## 2021-01-14 MED ORDER — MORPHINE SULFATE (CONCENTRATE) 10 MG /0.5 ML PO SOLN: 5.0000 mg | ORAL | 0 refills | Status: AC | PRN

## 2021-01-14 NOTE — Telephone Encounter (Signed)
On 8/23-I called patient's son to check on palliative care consultation.  As per the son patient was in agreement to go on hospice.  Discussed with the life expectancy is in the order of few weeks rather than months.   Encouraged to call us if any questions or concerns.  Appreciate efforts of Josh Borders and Christin Gusler in taking care of this patient.  GB

## 2021-01-14 NOTE — Progress Notes (Signed)
[facility] Palliative Care Consult Note Telephone: [phone]  Fax: [phone]    Date of encounter: 01/14/21 10:23 PM PATIENT NAME: Vincent Mason   [id number] (home)  DOB: [birth year] MRN: [id number] PRIMARY CARE PROVIDER:    Birdie Sons, MD,  [address] [place] [address] [phone]  RESPONSIBLE PARTY:    Contact Information     Name Relation Home Work Vincent Mason Spouse [phone]  [phone]   Vincent Mason, [place] [phone]  [phone]   Kwiecinski,Vicki Daughter [phone]        I met face to face with patient and family in home. Palliative Care was asked to follow this patient by consultation request of  Fisher, Kirstie Peri, MD to address advance care planning and complex medical decision making. This is a follow up visit.                                  ASSESSMENT AND PLAN / RECOMMENDATIONS:   Advance Care Planning/Goals of Care: Goals include to maximize quality of life and symptom management. Our advance care planning conversation included a discussion about:    The value and importance of advance care planning  Experiences with loved ones who have been seriously ill or have died  Exploration of personal, cultural or spiritual beliefs that might influence medical decisions  Exploration of goals of care in the event of a sudden injury or illness  Identification and preparation of a healthcare agent  Review and updating or creation of an  advance directive document . Decision not to resuscitate or to de-escalate disease focused treatments due to poor prognosis. CODE STATUS: Full code  Symptom Management/Plan: 1. Advance Care Planning; full code; agreeable to proceed with Hospice services after lengthily discussion with Vincent Mason, Vincent Mason, Vincent Mason. Updated Dr Rogue Bussing, Merrily Pew Borders NP; received hospice order, sent to [facility] hospice.   2. Shortness  of breath secondary to high-grade MDS/severe pancytopenia; Chronic afib with RVR, chronic systolic CHF, CKD; discussed will add roxanol to help with shortness of breath, ativan also. Will continue to address symptoms through hospice for medical goals of comfort.   3. Palliative care encounter; Palliative care encounter; Palliative medicine team will continue to support patient, patient's family, and medical team. Visit consisted of counseling and education dealing with the complex and emotionally intense issues of symptom management and palliative care in the setting of serious and potentially life-threatening illness  I spent 62 minutes providing this consultation. More than 50% of the time in this consultation was spent in counseling and care coordination.  PPS: 30%  HOSPICE ELIGIBILITY/DIAGNOSIS: yes per Hospice Physicians  Chief Complaint: Follow up palliative consult for complex medical decision making  HISTORY OF PRESENT ILLNESS:  Vincent Mason is a 85 y.o. year old male  with multiple medical problems including high-grade MDS/severe pancytopenia; Chronic afib with RVR, chronic systolic CHF, CAD s/p CABG/PCI, CKD stage III/IV-is. Hospitalized 12/30/2020 to 01/05/2021 for shortness of breath. Workup significant for Neutropenic sepsis secondary to multifocal pneumonia. Acute hypoxemic respiratory failure secondary to multifocal pneumonia multifocal pneumonia. Vincent Mason required bipap decreased to O2@2l /[place], new dx of high-grade MDS, Pancytopenia secondary to MDS, Severe anemia neutropenia and severe thrombocytopenia, Rectal bleeding secondary to hemorrhoids. Received blood and platelet transfusion for rectal bleeding. Chronic afib with RVR, chronic systolic CHF, elevated troponin secondary to sepsis. I  called Vincent Mason, Vincent Mason son to confirm PC f/u visit and covid screening negative and confirm okay to bring Vincent Gip NP, in agreement. [name] and I visited Vincent Mason in his home, he was lying on  his couch, appears chronically ill, mild to moderate respiratory distress with speaking, improves with rest, pale, severe weakness. Vincent Mason and I talked about how he has been feeling. We talked about symptoms of shortness of breath. We talked about adding roxanol and ativan, Vincent Mason in agreement. We talked about oxycodone he just took which typically helps sob. We talked about poor appetite. We talked about overall decline. Discussed medical goals including code status. At present time Vincent Mason endorses he would like to remain a full code. We talked about rapid decline, option of Hospice benefit through [facility] program. We talked about what services would be provided. Vincent. and Vincent Mason, Bernard in agreement. I contacted Dr Rogue Bussing, Billey Chang NP updated on wishes for hospice, order received. Vincent Borders NP will send in roxanol/ativan as currently decrease reception availability to send rx. Therapeutic listening, emotional support provided. Questions answered.   History obtained from review of EMR, discussion with family, wife and son Vincent Mason Vincent Mason.  I reviewed available labs, medications, imaging, studies and related documents from the EMR.  Records reviewed and summarized above.   ROS Full 14 system review of systems performed and negative with exception of: as per HPI.   Physical Exam: Constitutional: mild to moderate respiratory distress with speaking, exertion General: frail appearing, thin, pale, severely decompensated weak male EYES: lids intact ENMT: oral mucous membranes moist CV: S1S2, RRR, no LE edema Pulmonary: poor air movement throughout, + increased work of breathing, + cough,, 02 continuous  Abdomen:  soft and non tender MSK: requires to be lifted to stand/pivot; muscle wasting Skin: warm and dry Neuro:  +severe generalized weakness,  no cognitive impairment Psych: flat affect, A and O x 3  Questions and concerns were addressed. The patient/family was encouraged to call  with questions and/or concerns. My contact information was provided. Provided general support and encouragement, no other unmet needs identified   Thank you for the opportunity to participate in the care of Vincent Mason.  The palliative care team will continue to follow. Please call our office at [phone] if we can be of additional assistance.   This chart was dictated using voice recognition software.  Despite best efforts to proofread,  errors can occur which can change the documentation meaning.   Corrisa Gibby Z [place], NP   COVID-19 PATIENT SCREENING TOOL Asked and negative response unless otherwise noted:   Have you had symptoms of covid, tested positive or been in contact with someone with symptoms/positive test in the past 5-10 days?  no

## 2021-01-14 NOTE — Progress Notes (Signed)
I spoke with Christin Gusler, NP, who saw patient today in the home. Reportedly, patient is agreeable to hospice for end of life care. Will send Rx for morphine/lorazepam for comfort.

## 2021-01-16 ENCOUNTER — Other Ambulatory Visit: Payer: Self-pay

## 2021-01-17 ENCOUNTER — Other Ambulatory Visit: Payer: Medicare Other | Admitting: Nurse Practitioner

## 2021-01-17 ENCOUNTER — Other Ambulatory Visit: Payer: Self-pay

## 2021-01-20 ENCOUNTER — Telehealth: Payer: Self-pay | Admitting: *Deleted

## 2021-01-20 NOTE — Telephone Encounter (Signed)
[facility] called stating that they will be putting patient Death Certificate into the [place] system for Dr Rogue Bussing to sign

## 2021-01-23 DEATH — deceased

## 2021-01-24 ENCOUNTER — Telehealth: Payer: Self-pay | Admitting: Internal Medicine

## 2021-01-24 NOTE — Telephone Encounter (Signed)
On 09/02-called and spoke to son-offered my condolences.   GB

## 2022-04-24 LAB — HISTOPLASMA GAL'MANNAN AG SER: Histoplasma Gal'mannan Ag Ser: <0.5 (ref <0.5)

## 2022-04-24 LAB — HISTOPLASMA ANTIGEN, URINE: Histoplasma Antigen, urine: POSITIVE — AB (ref <0.5)
